# Patient Record
Sex: Female | Born: 1970 | Race: White | Hispanic: No | Marital: Single | Smoking: Former smoker
Health system: Southern US, Community
[De-identification: ages and names within clinical notes are randomized; demographics above are authoritative.]

## PROBLEM LIST (undated history)

## (undated) ENCOUNTER — Inpatient Hospital Stay (HOSPITAL_COMMUNITY): Payer: Self-pay

## (undated) DIAGNOSIS — F319 Bipolar disorder, unspecified: Secondary | ICD-10-CM

## (undated) DIAGNOSIS — F4310 Post-traumatic stress disorder, unspecified: Secondary | ICD-10-CM

## (undated) DIAGNOSIS — F419 Anxiety disorder, unspecified: Secondary | ICD-10-CM

## (undated) DIAGNOSIS — F329 Major depressive disorder, single episode, unspecified: Secondary | ICD-10-CM

## (undated) DIAGNOSIS — R87619 Unspecified abnormal cytological findings in specimens from cervix uteri: Secondary | ICD-10-CM

## (undated) DIAGNOSIS — IMO0002 Reserved for concepts with insufficient information to code with codable children: Secondary | ICD-10-CM

## (undated) DIAGNOSIS — F32A Depression, unspecified: Secondary | ICD-10-CM

## (undated) DIAGNOSIS — M1990 Unspecified osteoarthritis, unspecified site: Secondary | ICD-10-CM

## (undated) DIAGNOSIS — J45909 Unspecified asthma, uncomplicated: Secondary | ICD-10-CM

## (undated) HISTORY — DX: Bipolar disorder, unspecified: F31.9

## (undated) HISTORY — PX: NO PAST SURGERIES: SHX2092

---

## 2008-10-21 ENCOUNTER — Encounter (INDEPENDENT_AMBULATORY_CARE_PROVIDER_SITE_OTHER): Payer: Self-pay | Admitting: *Deleted

## 2008-10-21 DIAGNOSIS — F3289 Other specified depressive episodes: Secondary | ICD-10-CM | POA: Insufficient documentation

## 2008-10-21 DIAGNOSIS — F332 Major depressive disorder, recurrent severe without psychotic features: Secondary | ICD-10-CM | POA: Diagnosis present

## 2008-10-21 DIAGNOSIS — F329 Major depressive disorder, single episode, unspecified: Secondary | ICD-10-CM | POA: Insufficient documentation

## 2008-11-25 ENCOUNTER — Ambulatory Visit: Payer: Self-pay | Admitting: Family Medicine

## 2008-11-25 DIAGNOSIS — F312 Bipolar disorder, current episode manic severe with psychotic features: Secondary | ICD-10-CM

## 2008-11-25 DIAGNOSIS — R8789 Other abnormal findings in specimens from female genital organs: Secondary | ICD-10-CM | POA: Insufficient documentation

## 2008-11-25 DIAGNOSIS — F3163 Bipolar disorder, current episode mixed, severe, without psychotic features: Secondary | ICD-10-CM | POA: Insufficient documentation

## 2008-12-03 ENCOUNTER — Encounter: Payer: Self-pay | Admitting: Family Medicine

## 2008-12-03 ENCOUNTER — Ambulatory Visit: Payer: Self-pay | Admitting: Family Medicine

## 2008-12-03 LAB — CONVERTED CEMR LAB
ALT: 12 units/L (ref 0–35)
AST: 12 units/L (ref 0–37)
Albumin: 4.2 g/dL (ref 3.5–5.2)
Alkaline Phosphatase: 42 units/L (ref 39–117)
BUN: 10 mg/dL (ref 6–23)
CO2: 25 meq/L (ref 19–32)
Calcium: 9.4 mg/dL (ref 8.4–10.5)
Chloride: 106 meq/L (ref 96–112)
Cholesterol: 167 mg/dL (ref 0–200)
Creatinine, Ser: 0.78 mg/dL (ref 0.40–1.20)
Glucose, Bld: 84 mg/dL (ref 70–99)
HCT: 36.5 % (ref 36.0–46.0)
HDL: 44 mg/dL (ref >39)
Hemoglobin: 11.9 g/dL — ABNORMAL LOW (ref 12.0–15.0)
LDL Cholesterol: 107 mg/dL — ABNORMAL HIGH (ref 0–99)
MCHC: 32.6 g/dL (ref 30.0–36.0)
MCV: 88 fL (ref 78.0–100.0)
Platelets: 242 10*3/uL (ref 150–400)
Potassium: 4.3 meq/L (ref 3.5–5.3)
RBC: 4.15 M/uL (ref 3.87–5.11)
RDW: 13.2 % (ref 11.5–15.5)
Sodium: 140 meq/L (ref 135–145)
TSH: 1.35 microintl units/mL (ref 0.350–4.500)
Total Bilirubin: 0.7 mg/dL (ref 0.3–1.2)
Total CHOL/HDL Ratio: 3.8
Total Protein: 6.1 g/dL (ref 6.0–8.3)
Triglycerides: 78 mg/dL (ref <150)
VLDL: 16 mg/dL (ref 0–40)
WBC: 5 10*3/uL (ref 4.0–10.5)

## 2008-12-09 ENCOUNTER — Encounter: Payer: Self-pay | Admitting: Family Medicine

## 2009-01-07 ENCOUNTER — Encounter: Payer: Self-pay | Admitting: Family Medicine

## 2009-01-07 ENCOUNTER — Ambulatory Visit: Payer: Self-pay | Admitting: Family Medicine

## 2009-01-07 ENCOUNTER — Other Ambulatory Visit: Admission: RE | Admit: 2009-01-07 | Discharge: 2009-01-07 | Payer: Self-pay | Admitting: Family Medicine

## 2009-01-19 ENCOUNTER — Encounter: Payer: Self-pay | Admitting: Family Medicine

## 2009-03-18 ENCOUNTER — Telehealth: Payer: Self-pay | Admitting: *Deleted

## 2010-07-06 NOTE — Assessment & Plan Note (Signed)
Summary: CPE+pap/KH   Primary Care Provider:  Delbert Harness   History of Present Illness: 40 yo WF with hx of bipolar disorder here for annual visit.  LMP:  July 21 Contraception: none- no relationship in 3 years Regular Menses: regular periods- every 24-25 Hx of Anemia: no FHx of Breast, Uterine, Cervical or Ovarian Cancer:  No Last Pap:  several years ago Hx of Abnormal Pap:  yes- had colposcopy in 2002 and was told it was normal but needed to do paps every 6 months. Desires STD testing: No Last Mammogram:  5/09 Abnormalities on Self-exam:  No Hx of Abnormal Mammogram:  No    Allergies: No Known Drug Allergies PMH-FH-SH reviewed-no changes except otherwise noted  Review of Systems      See HPI General:  Denies fever and weight loss. CV:  Denies chest pain or discomfort, palpitations, and swelling of feet. Resp:  Denies cough and shortness of breath. GI:  Denies constipation, diarrhea, and hemorrhoids. GU:  Denies discharge, dysuria, and incontinence.  Physical Exam  General:  Well-developed,well-nourished,in no acute distress; alert,appropriate and cooperative throughout examination.  Very pleasant lady. Head:  Normocephalic and atraumatic without obvious abnormalities. No apparent alopecia or balding. Eyes:   EOMI. Perrla.  Nose:  External nasal examination shows no deformity or inflammation. Nasal mucosa are pink and moist without lesions or exudates. Mouth:  Oral mucosa and oropharynx without lesions or exudates.  Teeth in good repair. Neck:  No deformities, masses, or tenderness noted. Breasts:  No mass, nodules, thickening, tenderness, bulging, retraction, inflamation, nipple discharge or skin changes noted.   Lungs:  Normal respiratory effort, chest expands symmetrically. Lungs are clear to auscultation, no crackles or wheezes. Heart:  Normal rate and regular rhythm. S1 and S2 normal without gallop, murmur, click, rub or other extra sounds. Abdomen:  Bowel sounds  positive,abdomen soft and non-tender without masses, organomegaly or hernias noted. Genitalia:  Pelvic Exam:        External: normal female genitalia without lesions or masses        Vagina: normal without lesions or masses        Cervix: normal without lesions or masses        Adnexa: normal bimanual exam without masses or fullness        Uterus: normal by palpation        Pap smear: performed Pulses:  R and L carotid,radial,femoral,dorsalis pedis and posterior tibial pulses are full and equal bilaterally Extremities:  No clubbing, cyanosis, edema, or deformity noted with normal full range of motion of all joints.   Neurologic:  alert & oriented X3.   Psych:  Oriented X3, memory intact for recent and remote, normally interactive, and good eye contact.     Impression & Recommendations:  Problem # 1:  PREVENTIVE HEALTH CARE (ICD-V70.0) Advised multivitamin. Healthy weight.  Due to tdap today.  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  Will do pap smear today and discuss once results if need for increased frequency of monitoring or regular schedule.  I do not think patient has had any negatives since colposcopy.  Orders: [facility] - Est  18-39 yrs ([id number])  Complete Medication List: 1)  Wellbutrin Sr 100 Mg Xr12h-tab (Bupropion hcl) .... Take 2 tablets by mouth once daily 2)  Geodon 80 Mg Caps (Ziprasidone hcl) .... Take 1 tablet by mouth two times a day 3)  Seroquel 50 Mg Tabs (Quetiapine fumarate) .... Take 1 tablet by mouth once daily 4)  Benztropine Mesylate 0.5 Mg Tabs (Benztropine mesylate) .... Take 1 tablet by mouth at bedtime  Other Orders: Tdap => 26yrs IM [phone]) Admin 1st Vaccine ([address]) Admin 1st Vaccine [facility]) [phone])  TD Result Date:  01/07/2009 TD Result:  given  TD Next Due:  10 yr    Tetanus/Td Vaccine    Vaccine Type: Tdap    Site: right deltoid    Mfr: [email or web address]    Dose: 0.5 ml    Route: IM    Given by: Theresia Lo RN    Exp. Date: 04/09/2011    Lot #: [id number]    VIS given: 04/24/07 version given January 07, 2009.

## 2010-07-06 NOTE — Letter (Signed)
Summary: Results Follow-up Letter  [facility]  [address]   [place], [place] [address]   Phone: [phone]  Fax: [phone]    12/09/2008  [address] [place], [place]  [address]  Dear Kelly Carr,   The following are the results of your recent test(s):  Complete Blood Count, Complete Metabolic Panel, Fasting Cholesterol, and Thyroid (TSH) were all essentially normal.    If you need copies of these for your mental health provider, please let our clinic know so you don't have to duplicate tests and if you would ask your other providers to send their records to my office, it would be very helpful!  Thanks!  It was very nice meeting you!  Sincerely,  Delbert Harness MD [facility]           Appended Document: Results Follow-up Letter mailed letter to pt

## 2010-07-06 NOTE — Letter (Signed)
Summary: Results Follow-up Letter  [facility]  [address]   [place], [place] [address]   Phone: [phone]  Fax: [phone]    01/19/2009  [address] [place], [place]  [address]  Dear Ms. Rebstock,   The following are the results of your recent test(s):  Test     Result     Pap Smear    Normal__X_____  Not Normal_____       Comments: You are due for your pap in 1 year.   Sincerely,  Delbert Harness MD [facility]           Appended Document: Results Follow-up Letter mailed

## 2010-07-06 NOTE — Progress Notes (Signed)
Summary: triage  Phone Note Call from Patient Call back at Home Phone [phone]   Caller: Patient Reason for Call: Acute Illness Summary of Call: blood sugar running high and wonders if she should come in to be seen.   Initial call taken by: Clydell Hakim,  March 18, 2009 2:03 PM  Follow-up for Phone Call        she has been using her mom's meter to check herself & her  39 yr old dtr. fasting cbgs are 114-118. she is concerned. states they both feel fine. told her I will send this info to md & call her with response Follow-up by: Golden Circle RN,  March 18, 2009 2:14 PM  Additional Follow-up for Phone Call Additional follow up Details #1::        Based on my records her last fasting cbg was in the 80's.  Please have her make an appt (not urgent) and bring in her meter if she would like to validate it. Additional Follow-up by: Delbert Harness MD,  March 21, 2009 10:05 PM    Additional Follow-up for Phone Call Additional follow up Details #2::    read her md response. she will call back & make appt Follow-up by: Golden Circle RN,  March 23, 2009 9:10 AM

## 2010-07-06 NOTE — Assessment & Plan Note (Signed)
Summary: new pt appt/AC   Vital Signs:  Patient profile:   40 year old female Height:      64 inches Weight:      139.9 pounds BMI:     24.10 Temp:     98.6 degrees F Pulse rate:   89 / minute BP sitting:   106 / 71  (left arm) Cuff size:   regular  Vitals Entered By: Dedra Skeens CMA, (November 25, 2008 1:52 PM) CC: new patient Is Patient Diabetic? No Pain Assessment Patient in pain? no        Primary Care Provider:  Delbert Harness  CC:  new patient.  History of Present Illness: 40 yo with PMH sig for Bipolar DO here to establish primary care.  Primary Care:  Moved in [place] 2009 from [place], [place] to be closer to family.  She is here living with her brother which has been financially good since she is a single mother and is now able to work just one job to support her family.  She also moved here to better be able to take care of her mother who has multiple health problems.  States that overall she is doing much better, has paid of many of her debts and this has been a good change for her.  Bipolar DO:  Has history of inpatietn hospitalizations for manic episodes. Has been stable for 3 years on current medical regimen.  [facility] manages her psych medications.  Feels like she is continuing to do well despite life stresses and denies anhedonia, depression, manic symptoms.  Hx of abnormal pap smear:  States that several years ago had several abnormal pap smears that resulted in colposcopy.  States that she was told the colpo was normal, but would need pap smears every 6 months for a while.  She was lost to follow-up.  Does not recall being told she had HPV.  Would like to resume care and make sure this is taken care of.       Habits & Providers  Alcohol-Tobacco-Diet     Tobacco Status: never  Allergies: No Known Drug Allergies  Past History:  Past Medical History: Depression- bipolar dx age 29 hypomanic       - last hospitalization 2004 has been stable on same  medications [facility] q 3 months) Hx of abnormal pap smears s/p colpo that was "normal" per report  Past Surgical History: None  Family History: Reviewed history from 10/21/2008 and no changes required. Mom: Diabetes, HTN, First MI age 1's s/p triple bypass, obese, DM, bipolar Dad: unknown, alcoholism 3 brothers: no medical problems MGM: s/p bypass surgery, obese  Family History of CAD Female 1st degree relative <60 Family History Diabetes 1st degree relative  Social History: Reviewed history from 10/21/2008 and no changes required. Pt lives with her son born in 109 and daughter born in 2002.  Works full time at [employer] .  Enjoys spending time with her children and watching movies.  Uses no alcohol or illicit drugs since early 20's, and quit smoking cigarettes in 2001.  She exercises on exercise machines 2 times per week for 30 minutes.  Recently moved to [place] to take care of mother and to have more family support as a single mother.  Is not in a relationship- not sexually active.Smoking Status:  never  Review of Systems      See HPI General:  Denies fever. Eyes:  Denies blurring and double vision. ENT:  Denies nosebleeds  and sore throat. CV:  Denies chest pain or discomfort, lightheadness, near fainting, and palpitations. Resp:  Denies cough and shortness of breath. GI:  Denies abdominal pain, constipation, diarrhea, nausea, and vomiting. GU:  Denies discharge and dysuria. MS:  Denies joint pain.  Physical Exam  General:  Well-developed,well-nourished,in no acute distress; alert,appropriate and cooperative throughout examination.  Very pleasant lady. Eyes:   EOMI. Perrla.  Neck:  supple, full ROM, no masses, and no thyromegaly.   Lungs:  Normal respiratory effort, chest expands symmetrically. Lungs are clear to auscultation, no crackles or wheezes. Heart:  Normal rate and regular rhythm. S1 and S2 normal without gallop, murmur, click, rub or other extra  sounds. Abdomen:  Bowel sounds positive,abdomen soft and non-tender without masses, organomegaly or hernias noted. Extremities:  No clubbing, cyanosis, edema, or deformity noted  Cervical Nodes:  no anterior cervical adenopathy and no posterior cervical adenopathy.   Psych:  Oriented X3, memory intact for recent and remote, normally interactive, good eye contact, not anxious appearing, not depressed appearing, and not agitated.     Impression & Recommendations:  Problem # 1:  Hx of OTH ABNORMAL PAPANICOLAOU SMEAR CERVIX&CERV HPV (ICD-795.09) Will try to obtain records before her next appointment at which time we will do a pap smear/ well woman exam.  Problem # 2:  BIPOLAR AFFECTIVE DISORDER (ICD-296.80)  Appears to be stable on current regimen.  Managed by [facility].  Will check CMET, CBC, FLP for drug monitoring as patient says no bloodwork has been drawn by her [facility].  Looking for signs of metabolic syndrome, leukopenia.  Future Orders: Comp Met-[facility] [id number]) ... 11/19/2009 Lipid-[facility] ([id number]) ... 11/19/2009 CBC-[facility] ([id number]) ... 11/13/2009 TSH-[facility] [id number]) ... 11/13/2009  Problem # 3:  PREVENTIVE HEALTH CARE (ICD-V70.0) Advised multivitamin for woman of reproductive age.   BMI healthy.  Given pamphlet for Debt Services Counseling.  Will asses or Tdap at next visit.  Complete Medication List: 1)  Wellbutrin Sr 100 Mg Xr12h-tab (Bupropion hcl) .... Take 2 tablets by mouth once daily 2)  Geodon 80 Mg Caps (Ziprasidone hcl) .... Take 1 tablet by mouth two times a day 3)  Seroquel 50 Mg Tabs (Quetiapine fumarate) .... Take 1 tablet by mouth once daily 4)  Benztropine Mesylate 0.5 Mg Tabs (Benztropine mesylate) .... Take 1 tablet by mouth at bedtime  Patient Instructions: 1)  It was very nice to meet you! 2)  Make appointment to go to the lab for fasting labs. 3)  Make appointment at next available for Pap smear! 4)  Take a daily multivitamin.  5)  Consider seeing Debt management counseling- see handout.

## 2010-11-10 ENCOUNTER — Ambulatory Visit (HOSPITAL_COMMUNITY)
Admission: RE | Admit: 2010-11-10 | Discharge: 2010-11-10 | Disposition: A | Discharge status: Home / Self Care | Payer: Self-pay | Source: Ambulatory Visit | Attending: Family Medicine | Admitting: Family Medicine

## 2010-11-10 ENCOUNTER — Ambulatory Visit (INDEPENDENT_AMBULATORY_CARE_PROVIDER_SITE_OTHER): Payer: Self-pay | Admitting: Family Medicine

## 2010-11-10 ENCOUNTER — Encounter: Payer: Self-pay | Admitting: Family Medicine

## 2010-11-10 DIAGNOSIS — G5602 Carpal tunnel syndrome, left upper limb: Secondary | ICD-10-CM | POA: Insufficient documentation

## 2010-11-10 DIAGNOSIS — K219 Gastro-esophageal reflux disease without esophagitis: Secondary | ICD-10-CM

## 2010-11-10 DIAGNOSIS — R079 Chest pain, unspecified: Secondary | ICD-10-CM | POA: Insufficient documentation

## 2010-11-10 DIAGNOSIS — G5600 Carpal tunnel syndrome, unspecified upper limb: Secondary | ICD-10-CM

## 2010-11-10 NOTE — Progress Notes (Signed)
  Subjective:    Kelly Carr is a 40 y.o. female who presents for evaluation of chest pain. Onset was 1 day ago (yesterday). Symptoms have improved since that time. The patient described the pain as sharp and does not radiate. Patient rated pain as a 6/10 in intensity. Associated symptoms are: none (see ROS). Aggravating factors are: large meals. Alleviating factors are: none - took TUMS with no relief. Patient's cardiac risk factors are: family history of premature cardiovascular disease. Patient's risk factors for DVT/PE: none. Previous cardiac testing: none - no issues with HTN, HLD, DM in the past - recent labs good. Today, endorses left arm numbness/tingling since this am. No CP today. Hx bilateral carpal tunnel.  The following portions of the patient's history were reviewed and updated as appropriate: allergies, current medications, past family history, past medical history, past social history, past surgical history and problem list. Mom: Diabetes, HTN, First MI age 80's s/p triple bypass, obese, DM, bipolar.  Review of Systems Pertinent items are noted in HPI.  Denies fever/chills, N/V/D/C, sour taste in mouth, SOB, dizziness, falls/trauma, back pain.  Objective:    BP 106/71  Pulse 73  Temp(Src) 98.9 F (37.2 C) (Oral)  Ht 5\' 4"  (1.626 m)  Wt 138 lb (62.596 kg)  BMI 23.69 kg/m2 General appearance: alert, cooperative and no distress Neck: no adenopathy, no carotid bruit, no JVD, supple, symmetrical, trachea midline and thyroid not enlarged, symmetric, no tenderness/mass/nodules Lungs: clear to auscultation bilaterally Heart: regular rate and rhythm, S1, S2 normal, no murmur, click, rub or gallop Abdomen: soft, non-tender; bowel sounds normal; no masses,  no organomegaly Extremities: extremities normal, atraumatic, no cyanosis or edema Pulses: 2+ and symmetric Neurologic: Alert and oriented X 3, normal strength and tone. Normal symmetric reflexes. Normal coordination and gait MSK:  Normal strength. LEFT: + Tinels. + Phalen.  Cardiographics ECG: normal sinus rhythm, no blocks or conduction defects, no ischemic changes  Imaging Chest x-ray: not indicated    Assessment:    Chest pain, suspected etiology: GERD   Right Carpal Tunnel   Plan:    Patient history and exam consistent with non-cardiac cause of chest pain. Conservative measures indicated.  Conservative measures for Carpal Tunnel as well.

## 2010-11-10 NOTE — Assessment & Plan Note (Signed)
SEE PATIENT INSTRUCTIONS

## 2010-11-10 NOTE — Assessment & Plan Note (Signed)
Rx Zantac OTC and behavioral modification.

## 2011-12-05 ENCOUNTER — Encounter (HOSPITAL_COMMUNITY): Payer: Self-pay | Admitting: *Deleted

## 2011-12-05 ENCOUNTER — Inpatient Hospital Stay (HOSPITAL_COMMUNITY)
Admission: AD | Admit: 2011-12-05 | Discharge: 2011-12-05 | Disposition: A | Discharge status: Home / Self Care | Payer: Self-pay | Source: Ambulatory Visit | Attending: Obstetrics & Gynecology | Admitting: Obstetrics & Gynecology

## 2011-12-05 DIAGNOSIS — B9689 Other specified bacterial agents as the cause of diseases classified elsewhere: Secondary | ICD-10-CM | POA: Insufficient documentation

## 2011-12-05 DIAGNOSIS — N949 Unspecified condition associated with female genital organs and menstrual cycle: Secondary | ICD-10-CM | POA: Insufficient documentation

## 2011-12-05 DIAGNOSIS — N938 Other specified abnormal uterine and vaginal bleeding: Secondary | ICD-10-CM | POA: Insufficient documentation

## 2011-12-05 DIAGNOSIS — A499 Bacterial infection, unspecified: Secondary | ICD-10-CM | POA: Insufficient documentation

## 2011-12-05 DIAGNOSIS — N760 Acute vaginitis: Secondary | ICD-10-CM | POA: Insufficient documentation

## 2011-12-05 DIAGNOSIS — N925 Other specified irregular menstruation: Secondary | ICD-10-CM

## 2011-12-05 HISTORY — DX: Unspecified abnormal cytological findings in specimens from cervix uteri: R87.619

## 2011-12-05 HISTORY — DX: Reserved for concepts with insufficient information to code with codable children: IMO0002

## 2011-12-05 LAB — WET PREP, GENITAL
Trich, Wet Prep: NONE SEEN
Yeast Wet Prep HPF POC: NONE SEEN

## 2011-12-05 LAB — URINE MICROSCOPIC-ADD ON

## 2011-12-05 LAB — URINALYSIS, ROUTINE W REFLEX MICROSCOPIC
Bilirubin Urine: NEGATIVE
Glucose, UA: NEGATIVE mg/dL
Ketones, ur: NEGATIVE mg/dL
Leukocytes, UA: NEGATIVE
Nitrite: NEGATIVE
Protein, ur: NEGATIVE mg/dL
Specific Gravity, Urine: 1.01 (ref 1.005–1.030)
Urobilinogen, UA: 0.2 mg/dL (ref 0.0–1.0)
pH: 7.5 (ref 5.0–8.0)

## 2011-12-05 LAB — CBC
HCT: 33.8 % — ABNORMAL LOW (ref 36.0–46.0)
Hemoglobin: 11.4 g/dL — ABNORMAL LOW (ref 12.0–15.0)
MCH: 29 pg (ref 26.0–34.0)
MCHC: 33.7 g/dL (ref 30.0–36.0)
MCV: 86 fL (ref 78.0–100.0)
Platelets: 265 10*3/uL (ref 150–400)
RBC: 3.93 MIL/uL (ref 3.87–5.11)
RDW: 12.7 % (ref 11.5–15.5)
WBC: 8.8 10*3/uL (ref 4.0–10.5)

## 2011-12-05 LAB — POCT PREGNANCY, URINE: Preg Test, Ur: NEGATIVE

## 2011-12-05 MED ORDER — IBUPROFEN 600 MG PO TABS
600.0000 mg | ORAL_TABLET | Freq: Once | ORAL | Status: AC
  Administered 2011-12-05: 600 mg via ORAL
  Filled 2011-12-05: qty 1

## 2011-12-05 MED ORDER — MEDROXYPROGESTERONE ACETATE 10 MG PO TABS: 10.0000 mg | ORAL_TABLET | Freq: Every day | ORAL | Status: DC

## 2011-12-05 MED ORDER — METRONIDAZOLE 500 MG PO TABS: 500.0000 mg | ORAL_TABLET | Freq: Two times a day (BID) | ORAL | Status: AC

## 2011-12-05 MED ORDER — IBUPROFEN 600 MG PO TABS: 600.0000 mg | ORAL_TABLET | Freq: Four times a day (QID) | ORAL | Status: AC | PRN

## 2011-12-05 NOTE — Discharge Instructions (Signed)
Abnormal Uterine Bleeding Abnormal uterine bleeding can have many causes. Some cases are simply treated, while others are more serious. There are several kinds of bleeding that is considered abnormal, including:  Bleeding between periods.   Bleeding after sexual intercourse.   Spotting anytime in the menstrual cycle.   Bleeding heavier or more than normal.   Bleeding after menopause.  CAUSES  There are many causes of abnormal uterine bleeding. It can be present in teenagers, pregnant women, women during their reproductive years, and women who have reached menopause. Your caregiver will look for the more common causes depending on your age, signs, symptoms and your particular circumstance. Most cases are not serious and can be treated. Even the more serious causes, like cancer of the female organs, can be treated adequately if found in the early stages. That is why all types of bleeding should be evaluated and treated as soon as possible. DIAGNOSIS  Diagnosing the cause may take several kinds of tests. Your caregiver may:  Take a complete history of the type of bleeding.   Perform a complete physical exam and Pap smear.   Take an ultrasound on the abdomen showing a picture of the female organs and the pelvis.   Inject dye into the uterus and Fallopian tubes and X-ray them (hysterosalpingogram).   Place fluid in the uterus and do an ultrasound (sonohysterogrqphy).   Take a CT scan to examine the female organs and pelvis.   Take an MRI to examine the female organs and pelvis. There is no X-ray involved with this procedure.   Look inside the uterus with a telescope that has a light at the end (hysteroscopy).   Scrap the inside of the uterus to get tissue to examine (Dilatation and Curettage, D&C).   Look into the pelvis with a telescope that has a light at the end (laparoscopy). This is done through a very small cut (incision) in the abdomen.  TREATMENT  Treatment will depend on the  cause of the abnormal bleeding. It can include:  Doing nothing to allow the problem to take care of itself over time.   Hormone treatment.   Birth control pills.   Treating the medical condition causing the problem.   Laparoscopy.   Major or minor surgery   Destroying the lining of the uterus with electrical currant, laser, freezing or heat (uterine ablation).  HOME CARE INSTRUCTIONS   Follow your caregiver's recommendation on how to treat your problem.   See your caregiver if you missed a menstrual period and think you may be pregnant.   If you are bleeding heavily, count the number of pads/tampons you use and how often you have to change them. Tell this to your caregiver.   Avoid sexual intercourse until the problem is controlled.  SEEK MEDICAL CARE IF:   You have any kind of abnormal bleeding mentioned above.   You feel dizzy at times.   You are 41 years old and have not had a menstrual period yet.  SEEK IMMEDIATE MEDICAL CARE IF:   You pass out.   You are changing pads/tampons every 15 to 30 minutes.   You have belly (abdominal) pain.   You have a temperature of 100 F (37.8 C) or higher.   You become sweaty or weak.   You are passing large blood clots from the vagina.   You start to feel sick to your stomach (nauseous) and throw up (vomit).  Document Released: 05/23/2005 Document Revised: 05/12/2011 Document Reviewed: 10/16/2008 Exit[employer]   Patient Information 2012 [employer]. 

## 2011-12-05 NOTE — MAU Provider Note (Signed)
History     CSN: [id number]  Arrival date and time: 12/05/11 1534   None     Chief Complaint  Patient presents with  . Vaginal Bleeding   HPI  Patient states she has been having irregular periods for about 5 months. Prior to that cycle was every 25-28 days.  States she had bleeding on 6-17 then started again today with heavy bleeding.  Blood saturated clothing.   Reports using 4 pads earlier this morning.  Has had bad cramping today also. No report of passing clots.   Sore breasts for 4 months. Has taken multiple pregnancy test over the past 5 months and have been negative.   Past Medical History  Diagnosis Date  . Bipolar 1 disorder     History reviewed. No pertinent past surgical history.  History reviewed. No pertinent family history.  History  Substance Use Topics  . Smoking status: Former Games developer  . Smokeless tobacco: Never Used  . Alcohol Use: No    Allergies: No Known Allergies  Prescriptions prior to admission  Medication Sig Dispense Refill  . benztropine (COGENTIN) 0.5 MG tablet Take 0.25 mg by mouth at bedtime.       Marland Kitchen buPROPion (WELLBUTRIN SR) 100 MG 12 hr tablet Take 200 mg by mouth daily.        . calcium-vitamin D (OSCAL WITH D) 500-200 MG-UNIT per tablet Take 1 tablet by mouth daily.      . Melatonin 5 MG TABS Take 1 tablet by mouth at bedtime. For sleep      . traZODone (DESYREL) 50 MG tablet Take 12.5-25 mg by mouth at bedtime as needed. For sleep      . ziprasidone (GEODON) 80 MG capsule Take 80 mg by mouth daily.         Review of Systems  Gastrointestinal: Positive for nausea and abdominal pain.  Genitourinary:       Vaginal bleeding  Skin:       +breast tenderness and leaking of clear discharge x 4 months.    All other systems reviewed and are negative.   Physical Exam   Blood pressure 132/79, pulse 108, temperature 98.9 F (37.2 C), temperature source Oral, resp. rate 16, height 5\' 4"  (1.626 m), weight 65.227 kg (143 lb 12.8 oz), last  menstrual period 11/21/2011, SpO2 100.00%.  Physical Exam  Constitutional: She is oriented to person, place, and time. She appears well-developed and well-nourished.  HENT:  Head: Normocephalic.  Neck: Normal range of motion. Neck supple.  Cardiovascular: Normal rate, regular rhythm and normal heart sounds.   Respiratory: Effort normal and breath sounds normal.  GI: Soft. She exhibits no mass. There is tenderness. There is no guarding.  Genitourinary: Uterus is enlarged. Uterus is not tender. Cervix exhibits no motion tenderness and no discharge. Right adnexum displays no mass and no tenderness. Left adnexum displays no mass and no tenderness. There is bleeding (negative clots; +odor) around the vagina.  Neurological: She is alert and oriented to person, place, and time. She has normal reflexes.  Skin: Skin is warm and dry.    MAU Course  Procedures  Results for orders placed during the hospital encounter of 12/05/11 (from the past 24 hour(s))  URINALYSIS, ROUTINE W REFLEX MICROSCOPIC     Status: Abnormal   Collection Time   12/05/11  4:00 PM      Component Value Range   Color, Urine YELLOW  YELLOW   APPearance CLOUDY (*) CLEAR   Specific Gravity, Urine  1.010  1.005 - 1.030   pH 7.5  5.0 - 8.0   Glucose, UA NEGATIVE  NEGATIVE mg/dL   Hgb urine dipstick LARGE (*) NEGATIVE   Bilirubin Urine NEGATIVE  NEGATIVE   Ketones, ur NEGATIVE  NEGATIVE mg/dL   Protein, ur NEGATIVE  NEGATIVE mg/dL   Urobilinogen, UA 0.2  0.0 - 1.0 mg/dL   Nitrite NEGATIVE  NEGATIVE   Leukocytes, UA NEGATIVE  NEGATIVE  URINE MICROSCOPIC-ADD ON     Status: Abnormal   Collection Time   12/05/11  4:00 PM      Component Value Range   Squamous Epithelial / LPF FEW (*) RARE   WBC, UA 0-2  <3 WBC/hpf   RBC / HPF 3-6  <3 RBC/hpf   Bacteria, UA MANY (*) RARE  POCT PREGNANCY, URINE     Status: Normal   Collection Time   12/05/11  4:16 PM      Component Value Range   Preg Test, Ur NEGATIVE  NEGATIVE  WET PREP,  GENITAL     Status: Abnormal   Collection Time   12/05/11  7:10 PM      Component Value Range   Yeast Wet Prep HPF POC NONE SEEN  NONE SEEN   Trich, Wet Prep NONE SEEN  NONE SEEN   Clue Cells Wet Prep HPF POC FEW (*) NONE SEEN   WBC, Wet Prep HPF POC FEW (*) NONE SEEN  CBC     Status: Abnormal   Collection Time   12/05/11  7:18 PM      Component Value Range   WBC 8.8  4.0 - 10.5 K/uL   RBC 3.93  3.87 - 5.11 MIL/uL   Hemoglobin 11.4 (*) 12.0 - 15.0 g/dL   HCT 98.1 (*) 19.1 - 47.8 %   MCV 86.0  78.0 - 100.0 fL   MCH 29.0  26.0 - 34.0 pg   MCHC 33.7  30.0 - 36.0 g/dL   RDW 29.5  62.1 - 30.8 %   Platelets 265  150 - 400 K/uL     Assessment and Plan  Dysfunctional Uterine Bleeding - stable Bacterial Vaginosis  Plan: DC to home RX Provera Pelvic US outpatient Prolactin/TSH pending Schedule follow-up appointment in GYN  [facility] 12/05/2011, 6:40 PM

## 2011-12-05 NOTE — MAU Note (Signed)
Patient states she has been having irregular periods for about 4 months. States she had bleeding on 6-17 then started again today with heavy bleeding. Has had bad cramping today. Pad patient has on has a small amount of dark blood on it. Has had sore breasts for 4 months. Has taken multiple pregnancy test over the past 5 months and have been negative.

## 2011-12-06 LAB — TSH: TSH: 1.023 u[IU]/mL (ref 0.350–4.500)

## 2011-12-06 LAB — PROLACTIN: Prolactin: 2 ng/mL

## 2011-12-09 ENCOUNTER — Telehealth (HOSPITAL_COMMUNITY): Payer: Self-pay | Admitting: Nurse Practitioner

## 2011-12-09 ENCOUNTER — Ambulatory Visit (HOSPITAL_COMMUNITY)
Admission: RE | Admit: 2011-12-09 | Discharge: 2011-12-09 | Disposition: A | Discharge status: Home / Self Care | Payer: Self-pay | Source: Ambulatory Visit | Attending: Family | Admitting: Family

## 2011-12-09 DIAGNOSIS — N949 Unspecified condition associated with female genital organs and menstrual cycle: Secondary | ICD-10-CM | POA: Insufficient documentation

## 2011-12-09 DIAGNOSIS — N925 Other specified irregular menstruation: Secondary | ICD-10-CM | POA: Insufficient documentation

## 2011-12-09 DIAGNOSIS — N938 Other specified abnormal uterine and vaginal bleeding: Secondary | ICD-10-CM | POA: Insufficient documentation

## 2011-12-09 NOTE — Telephone Encounter (Signed)
This patient was evaluated 3 days ago for abnormal vaginal bleeding. She was scheduled for follow up ultrasound today. The ultrasound was normal. She has a follow up appointment in 3 weeks with [facility]. I discussed the results of the ultrasound and need for her to continue the Provera until her appointment. I will phone in Percocet and she will continue ibuprofen as needed.

## 2011-12-29 ENCOUNTER — Encounter: Payer: Self-pay | Admitting: Obstetrics & Gynecology

## 2011-12-29 ENCOUNTER — Ambulatory Visit (INDEPENDENT_AMBULATORY_CARE_PROVIDER_SITE_OTHER): Payer: Self-pay | Admitting: Obstetrics & Gynecology

## 2011-12-29 VITALS — BP 117/70 | HR 83 | Temp 98.0°F | Resp 12 | Ht 64.0 in | Wt 138.4 lb

## 2011-12-29 DIAGNOSIS — N949 Unspecified condition associated with female genital organs and menstrual cycle: Secondary | ICD-10-CM

## 2011-12-29 DIAGNOSIS — Z01419 Encounter for gynecological examination (general) (routine) without abnormal findings: Secondary | ICD-10-CM

## 2011-12-29 DIAGNOSIS — Z3009 Encounter for other general counseling and advice on contraception: Secondary | ICD-10-CM | POA: Insufficient documentation

## 2011-12-29 DIAGNOSIS — N925 Other specified irregular menstruation: Secondary | ICD-10-CM

## 2011-12-29 DIAGNOSIS — N938 Other specified abnormal uterine and vaginal bleeding: Secondary | ICD-10-CM

## 2011-12-29 LAB — POCT PREGNANCY, URINE: Preg Test, Ur: NEGATIVE

## 2011-12-29 MED ORDER — NORGESTIMATE-ETH ESTRADIOL 0.25-35 MG-MCG PO TABS: 1.0000 | ORAL_TABLET | Freq: Every day | ORAL | Status: DC

## 2011-12-29 NOTE — Progress Notes (Signed)
Patient ID: Kelly Carr, female   DOB: 04/18/1971, 41 y.o.   MRN: [id number]  Chief Complaint  Patient presents with  . Procedure    endometrial biopsy/dub    HPI Kelly Carr is a 41 y.o. female.  Patient comes in followup after in MAU visit for irregular occasional heavy bleeding. She had regular menses until the 5 months ago. She thinks that stress at home may be playing a part in her menstrual irregularity. She's been sexually active for the last 2 years and has not conceived using no birth control method. Her partner has fathered children previously. She does not wish to conceive. HPI  Past Medical History  Diagnosis Date  . Bipolar 1 disorder   . Abnormal Pap smear     Unknown results>colpo>normal    No past surgical history on file.  No family history on file.  Social History History  Substance Use Topics  . Smoking status: Former Games developer  . Smokeless tobacco: Never Used  . Alcohol Use: No    No Known Allergies  Current Outpatient Prescriptions  Medication Sig Dispense Refill  . benztropine (COGENTIN) 0.5 MG tablet Take 0.25 mg by mouth at bedtime.       Marland Kitchen buPROPion (WELLBUTRIN SR) 100 MG 12 hr tablet Take 200 mg by mouth daily.        . calcium-vitamin D (OSCAL WITH D) 500-200 MG-UNIT per tablet Take 1 tablet by mouth daily.      . medroxyPROGESTERone (PROVERA) 10 MG tablet Take 1 tablet (10 mg total) by mouth daily.  30 tablet  0  . Melatonin 5 MG TABS Take 1 tablet by mouth at bedtime. For sleep      . traZODone (DESYREL) 50 MG tablet Take 12.5-25 mg by mouth at bedtime as needed. For sleep      . ziprasidone (GEODON) 80 MG capsule Take 80 mg by mouth daily.       . norgestimate-ethinyl estradiol (ORTHO-CYCLEN,SPRINTEC,PREVIFEM) 0.25-35 MG-MCG tablet Take 1 tablet by mouth daily.  1 Package  11    Review of Systems Review of Systems  Genitourinary: Positive for vaginal bleeding (brownish spotting on Provera). Negative for vaginal pain.    Blood pressure  117/70, pulse 83, temperature 98 F (36.7 C), temperature source Oral, resp. rate 12, height 5\' 4"  (1.626 m), weight 138 lb 6.4 oz (62.778 kg), last menstrual period 11/28/2011.  Physical Exam Physical Exam  Constitutional: She appears well-developed and well-nourished. No distress.  Abdominal: Soft. She exhibits no distension and no mass. There is no tenderness.  Genitourinary: Uterus normal. Vaginal discharge (brown vaginal spotting) found.       No adnexal masses  Neurological: She is alert.  Skin: Skin is warm and dry.  Psychiatric: She has a normal mood and affect.    Data Reviewed *RADIOLOGY REPORT*  Clinical Data: Vaginal bleeding  TRANSABDOMINAL AND TRANSVAGINAL ULTRASOUND OF PELVIS  Technique: Both transabdominal and transvaginal ultrasound  examinations of the pelvis were performed. Transabdominal technique  was performed for global imaging of the pelvis including uterus,  ovaries, adnexal regions, and pelvic cul-de-sac.  It was necessary to proceed with endovaginal exam following the  transabdominal exam to visualize the endometrium and ovaries.  Comparison: None  Findings:  Uterus: Normal in size at 10.0 x 4.3 x 6.0 cm.  Endometrium: Uniform in thickness at 5 mm.  Right ovary: Normal in size measuring 2.1 x 1.8 x 1.6 cm.  Left ovary: Normal in size measuring 2.6 x 2.6 x 2.0  cm.  Other findings: No free fluid  IMPRESSION:  1. Normal pelvic exam.  2. Normal endometrium.  Original Report Authenticated By: Genevive Bi, M.D. CBC    Component Value Date/Time   WBC 8.8 12/05/2011 1918   RBC 3.93 12/05/2011 1918   HGB 11.4* 12/05/2011 1918   HCT 33.8* 12/05/2011 1918   PLT 265 12/05/2011 1918   MCV 86.0 12/05/2011 1918   MCH 29.0 12/05/2011 1918   MCHC 33.7 12/05/2011 1918   RDW 12.7 12/05/2011 1918      Assessment    Normal pelvic ultrasound normal endometrium with dysfunctional uterine bleeding. Likely due to anovulation. She would like to have a birth control method and  cycle control.    Plan    Sprintec was prescribed. Recommend she return in 3 months to review her progress. She will report if her symptoms do not improve  Dr. Scheryl Darter 12/29/2011 2:55 PM        Noam Franzen 12/29/2011, 2:50 PM

## 2011-12-29 NOTE — Patient Instructions (Signed)
Oral Contraception Use Oral contraceptives (OCs) are medicines taken to prevent pregnancy. OCs work by preventing the ovaries from releasing eggs. The hormones in OCs also cause the cervical mucus to thicken, preventing the sperm from entering the uterus. The hormones also cause the uterine lining to become thin, not allowing a fertilized egg to attach to the inside of the uterus. OCs are highly effective when taken exactly as prescribed. However, OCs do not prevent sexually transmitted diseases (STDs). Safe sex practices, such as using condoms along with an OC, can help prevent STDs.  Before taking OCs, you may have a physical exam and Pap test. Your caregiver may also order blood tests if necessary. Your caregiver will make sure you are a good candidate for oral contraception. Discuss with your caregiver the possible side effects of the OC you may be prescribed. When starting an OC, it can take 2 to 3 months for the body to adjust to the changes in hormone levels in your body.  HOW TO TAKE ORAL CONTRACEPTIVES Your caregiver may advise you on how to start taking the first cycle of OCs. Otherwise, you can:  Start on day 1 of your menstrual period. You will not need any backup contraceptive protection with this start time.   Start on the first Sunday after your menstrual period or the day you get your prescription. In these cases, you will need to use backup contraceptive protection for the first 7-day cycle.  After you have started taking OCs:  If you forget to take 1 pill, take it as soon as you remember. Take the next pill at the regular time.   If you miss 2 or more pills, use backup birth control until your next menstrual period starts.   If you use a 28-day pack that contains inactive pills and you miss 1 of the last 7 pills (pills with no hormones), it will not matter. Throw away the rest of the non-hormone pills and start a new pill pack.  No matter which day you start the OC, you will always  start a new pack on that same day of the week. Have an extra pack of OCs and a backup contraceptive method available in case you miss some pills or lose your OC pack. HOME CARE INSTRUCTIONS   Do not smoke.   Always use a condom to protect against STDs. OCs do not protect against STDs.   Use a calendar to mark your menstrual period days.   Read the information and directions that come with your OC. Talk to your caregiver if you have questions.  SEEK MEDICAL CARE IF:   You develop nausea and vomiting.   You have abnormal vaginal discharge or bleeding.   You develop a rash.   You miss your menstrual period.   You are losing your hair.   You need treatment for mood swings or depression.   You get dizzy when taking the OC.   You develop acne from taking the OC.   You become pregnant.  SEEK IMMEDIATE MEDICAL CARE IF:   You develop chest pain.   You develop shortness of breath.   You have an uncontrolled or severe headache.   You develop numbness or slurred speech.   You develop visual problems.   You develop pain, redness, and swelling in the legs.  Document Released: 05/12/2011 Document Reviewed: 05/10/2011 [employer] Patient Information 2012 [employer]. 

## 2012-02-10 ENCOUNTER — Encounter: Payer: Self-pay | Admitting: Obstetrics & Gynecology

## 2012-07-23 ENCOUNTER — Emergency Department (HOSPITAL_COMMUNITY)
Admission: EM | Admit: 2012-07-23 | Discharge: 2012-07-24 | Disposition: A | Discharge status: Other Acute Inpatient Hospital | Payer: Self-pay | Attending: Emergency Medicine | Admitting: Emergency Medicine

## 2012-07-23 ENCOUNTER — Encounter (HOSPITAL_COMMUNITY): Payer: Self-pay | Admitting: Emergency Medicine

## 2012-07-23 DIAGNOSIS — F39 Unspecified mood [affective] disorder: Secondary | ICD-10-CM | POA: Insufficient documentation

## 2012-07-23 DIAGNOSIS — Z87891 Personal history of nicotine dependence: Secondary | ICD-10-CM | POA: Insufficient documentation

## 2012-07-23 DIAGNOSIS — R45851 Suicidal ideations: Secondary | ICD-10-CM

## 2012-07-23 DIAGNOSIS — Z79899 Other long term (current) drug therapy: Secondary | ICD-10-CM | POA: Insufficient documentation

## 2012-07-23 DIAGNOSIS — F319 Bipolar disorder, unspecified: Secondary | ICD-10-CM | POA: Insufficient documentation

## 2012-07-23 DIAGNOSIS — Z3202 Encounter for pregnancy test, result negative: Secondary | ICD-10-CM | POA: Insufficient documentation

## 2012-07-23 DIAGNOSIS — Z8742 Personal history of other diseases of the female genital tract: Secondary | ICD-10-CM | POA: Insufficient documentation

## 2012-07-23 DIAGNOSIS — F411 Generalized anxiety disorder: Secondary | ICD-10-CM | POA: Insufficient documentation

## 2012-07-23 HISTORY — DX: Anxiety disorder, unspecified: F41.9

## 2012-07-23 LAB — URINALYSIS, ROUTINE W REFLEX MICROSCOPIC
Bilirubin Urine: NEGATIVE
Glucose, UA: NEGATIVE mg/dL
Hgb urine dipstick: NEGATIVE
Ketones, ur: NEGATIVE mg/dL
Leukocytes, UA: NEGATIVE
Nitrite: NEGATIVE
Protein, ur: NEGATIVE mg/dL
Specific Gravity, Urine: 1.007 (ref 1.005–1.030)
Urobilinogen, UA: 0.2 mg/dL (ref 0.0–1.0)
pH: 7 (ref 5.0–8.0)

## 2012-07-23 LAB — CBC WITH DIFFERENTIAL/PLATELET
Basophils Absolute: 0 10*3/uL (ref 0.0–0.1)
Basophils Relative: 0 % (ref 0–1)
Eosinophils Absolute: 0.1 10*3/uL (ref 0.0–0.7)
Eosinophils Relative: 1 % (ref 0–5)
HCT: 38.9 % (ref 36.0–46.0)
Hemoglobin: 13.1 g/dL (ref 12.0–15.0)
Lymphocytes Relative: 26 % (ref 12–46)
Lymphs Abs: 2.1 10*3/uL (ref 0.7–4.0)
MCH: 28.9 pg (ref 26.0–34.0)
MCHC: 33.7 g/dL (ref 30.0–36.0)
MCV: 85.9 fL (ref 78.0–100.0)
Monocytes Absolute: 0.7 10*3/uL (ref 0.1–1.0)
Monocytes Relative: 9 % (ref 3–12)
Neutro Abs: 5.2 10*3/uL (ref 1.7–7.7)
Neutrophils Relative %: 65 % (ref 43–77)
Platelets: 264 10*3/uL (ref 150–400)
RBC: 4.53 MIL/uL (ref 3.87–5.11)
RDW: 12.8 % (ref 11.5–15.5)
WBC: 8 10*3/uL (ref 4.0–10.5)

## 2012-07-23 LAB — COMPREHENSIVE METABOLIC PANEL
ALT: 21 U/L (ref 0–35)
AST: 19 U/L (ref 0–37)
Albumin: 3.6 g/dL (ref 3.5–5.2)
Alkaline Phosphatase: 61 U/L (ref 39–117)
BUN: 9 mg/dL (ref 6–23)
CO2: 27 mEq/L (ref 19–32)
Calcium: 9.1 mg/dL (ref 8.4–10.5)
Chloride: 104 mEq/L (ref 96–112)
Creatinine, Ser: 0.6 mg/dL (ref 0.50–1.10)
GFR calc Af Amer: >90 mL/min (ref >90)
GFR calc non Af Amer: >90 mL/min (ref >90)
Glucose, Bld: 96 mg/dL (ref 70–99)
Potassium: 3.6 mEq/L (ref 3.5–5.1)
Sodium: 139 mEq/L (ref 135–145)
Total Bilirubin: 0.6 mg/dL (ref 0.3–1.2)
Total Protein: 7.2 g/dL (ref 6.0–8.3)

## 2012-07-23 LAB — RAPID URINE DRUG SCREEN, HOSP PERFORMED
Amphetamines: NOT DETECTED
Barbiturates: NOT DETECTED
Benzodiazepines: NOT DETECTED
Cocaine: NOT DETECTED
Opiates: NOT DETECTED
Tetrahydrocannabinol: NOT DETECTED

## 2012-07-23 LAB — ETHANOL: Alcohol, Ethyl (B): <11 mg/dL (ref 0–11)

## 2012-07-23 LAB — POCT PREGNANCY, URINE: Preg Test, Ur: NEGATIVE

## 2012-07-23 MED ORDER — ZOLPIDEM TARTRATE 5 MG PO TABS
5.0000 mg | ORAL_TABLET | Freq: Every evening | ORAL | Status: DC | PRN
Start: 2012-07-23 — End: 2012-07-24
  Administered 2012-07-24: 5 mg via ORAL
  Filled 2012-07-23: qty 1

## 2012-07-23 MED ORDER — LORAZEPAM 1 MG PO TABS
1.0000 mg | ORAL_TABLET | Freq: Three times a day (TID) | ORAL | Status: DC | PRN
Start: 2012-07-23 — End: 2012-07-24
  Administered 2012-07-23 – 2012-07-24 (×2): 1 mg via ORAL
  Filled 2012-07-23 (×2): qty 1

## 2012-07-23 MED ORDER — ONDANSETRON HCL 8 MG PO TABS: 4.0000 mg | ORAL_TABLET | Freq: Three times a day (TID) | ORAL | Status: DC | PRN

## 2012-07-23 MED ORDER — ACETAMINOPHEN 325 MG PO TABS
650.0000 mg | ORAL_TABLET | ORAL | Status: DC | PRN
  Administered 2012-07-24: 650 mg via ORAL
  Filled 2012-07-23: qty 2

## 2012-07-23 NOTE — ED Provider Notes (Signed)
Medical screening examination/treatment/procedure(s) were performed by non-physician practitioner and as supervising physician I was immediately available for consultation/collaboration.   Richardean Canal, MD 07/23/12 [phone]
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
## 2012-07-23 NOTE — ED Notes (Signed)
Phlebotomy at bedside.

## 2012-07-23 NOTE — ED Notes (Signed)
PA at bedside.
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
## 2012-07-23 NOTE — ED Notes (Signed)
Pt denies SI at this time but states she has had thoughts in the past. Pt had planned to take her medicine and not wake up.

## 2012-07-23 NOTE — ED Notes (Signed)
Having depression this episode started Friday  States take meds for it but not as she should on Friday took to much med on purpose( trazadone) states not sleeping at all having SI but really she just wants to sleep, and anxiety attack

## 2012-07-24 LAB — HCG, QUANTITATIVE, PREGNANCY: hCG, Beta Chain, Quant, S: <1 m[IU]/mL (ref <5)

## 2012-07-24 LAB — VITAMIN B12: Vitamin B-12: 667 pg/mL (ref 211–911)

## 2012-07-24 LAB — FOLATE: Folate: 14.1 ng/mL

## 2012-07-24 NOTE — BH Assessment (Signed)
Assessment Note  Update:  Pt accepted to [facility] to Dr. Tiburcio Pea per last clinician on shift.  Sheriff Paschal stated he was sending a Conservator, museum/gallery to transport pt at approximately 1130 today.  Pt informed.  Pt under IVC.  Updated EDP Ignacia Palma and ED staff.  Updated assessment disposition, completed assessment notification, and faxed to [facility] to log.      Disposition:  Disposition Disposition of Patient: Inpatient treatment program Type of inpatient treatment program: Adult Patient referred to: Other (Comment) (Pt accepted to [facility])  On Site Evaluation by:   Reviewed with Physician:  Bryson Ha, Rennis Harding 07/24/2012 11:03 AM

## 2012-07-24 NOTE — BHH Counselor (Signed)
Patient reviewed and accepted for admission to [facility], pending an available 400 hall bed, by Donell Sievert, PA at 0100 on 07/24/12.

## 2012-07-24 NOTE — BH Assessment (Signed)
[facility] Assessment Progress Note   Referral was made to [facility] around 04:30 and clinician spoke to [place] there.  She called back around 05:50 to report that Dr. Tiburcio Pea there had accepted patient.  Dr. Tiburcio Pea is requesting a EKG, HCG levels, B-12 & folate labs.  Dr. Rulon [facility]) was notified and ordered labs and completed IVC papers.  Clinician sent IVC papers to magistrate at 06:30.  Nurses are to call report when patient is picked up by [facility] Department.  Call report to [phone].

## 2012-07-24 NOTE — B[facility] Counselor (Signed)
Per Beatriz Stallion, patient accepted to [facility].

## 2012-07-24 NOTE — Progress Notes (Signed)
11:08 AM Pt accepted for Transfer to [facility] by Dr. Tiburcio Pea.

## 2012-09-10 ENCOUNTER — Ambulatory Visit (INDEPENDENT_AMBULATORY_CARE_PROVIDER_SITE_OTHER): Payer: BC Managed Care – PPO | Admitting: Family Medicine

## 2012-09-10 ENCOUNTER — Encounter: Payer: Self-pay | Admitting: Family Medicine

## 2012-09-10 VITALS — BP 117/50 | HR 72 | Temp 99.1°F | Ht 64.0 in | Wt 140.0 lb

## 2012-09-10 DIAGNOSIS — J209 Acute bronchitis, unspecified: Secondary | ICD-10-CM

## 2012-09-10 MED ORDER — BENZONATATE 100 MG PO CAPS: 100.0000 mg | ORAL_CAPSULE | Freq: Three times a day (TID) | ORAL | Status: DC | PRN

## 2012-09-10 MED ORDER — ALBUTEROL SULFATE HFA 108 (90 BASE) MCG/ACT IN AERS: 2.0000 | INHALATION_SPRAY | Freq: Four times a day (QID) | RESPIRATORY_TRACT | Status: DC | PRN

## 2012-09-10 MED ORDER — GUAIFENESIN 200 MG PO TABS: 200.0000 mg | ORAL_TABLET | ORAL | Status: DC | PRN

## 2012-09-10 NOTE — Patient Instructions (Addendum)
Bronchitis Bronchitis is the body's way of reacting to injury and/or infection (inflammation) of the bronchi. Bronchi are the air tubes that extend from the windpipe into the lungs. If the inflammation becomes severe, it may cause shortness of breath. CAUSES  Inflammation may be caused by:  A virus.  Germs (bacteria).  Dust.  Allergens.  Pollutants and many other irritants. The cells lining the bronchial tree are covered with tiny hairs (cilia). These constantly beat upward, away from the lungs, toward the mouth. This keeps the lungs free of pollutants. When these cells become too irritated and are unable to do their job, mucus begins to develop. This causes the characteristic cough of bronchitis. The cough clears the lungs when the cilia are unable to do their job. Without either of these protective mechanisms, the mucus would settle in the lungs. Then you would develop pneumonia. Smoking is a common cause of bronchitis and can contribute to pneumonia. Stopping this habit is the single most important thing you can do to help yourself. TREATMENT   Your caregiver may prescribe an antibiotic if the cough is caused by bacteria. Also, medicines that open up your airways make it easier to breathe. Your caregiver may also recommend or prescribe an expectorant. It will loosen the mucus to be coughed up. Only take over-the-counter or prescription medicines for pain, discomfort, or fever as directed by your caregiver.  Removing whatever causes the problem (smoking, for example) is critical to preventing the problem from getting worse.  Cough suppressants may be prescribed for relief of cough symptoms.  Inhaled medicines may be prescribed to help with symptoms now and to help prevent problems from returning.  For those with recurrent (chronic) bronchitis, there may be a need for steroid medicines. SEEK IMMEDIATE MEDICAL CARE IF:   During treatment, you develop more pus-like mucus (purulent  sputum).  You have a fever.  You become progressively more ill.  You have increased difficulty breathing, wheezing, or shortness of breath. It is necessary to seek immediate medical care if you are sick from any other disease. MAKE SURE YOU:   Understand these instructions.  Will watch your condition.  Will get help right away if you are not doing well or get worse. Document Released: 05/23/2005 Document Revised: 08/15/2011 Document Reviewed: 04/01/2008 [facility] Patient Information 2013 [place], [place].

## 2012-09-10 NOTE — Progress Notes (Signed)
Family Medicine Office Visit Note   Subjective:   Patient ID: Kelly Carr, female  DOB: 11/06/1970, 42 y.o.. MRN: [id number]   Pt that comes today complaining of nasal congestion, "watery eyes" and body aches since last Thursday (4 days). Since yesterday she has noticed cough and "chest congestion". She denies fever or chills, nausea or vomiting. Normal BM. She has tried antihistaminic treatment but reports has not helped. Her son and a co-worker has been sick for the past week with similar symptoms.  Review of Systems:  Per HPI, also pt denies SOB or  chest pain.  Objective:   Physical Exam: Gen:  NAD HEENT: Moist mucous membranes. clear Rhinorrhea. No tenderness with palpation on frontal and maxillary sinuses. CV: Regular rate and rhythm, no murmurs rubs or gallops PULM: Clear to auscultation bilaterally. Scattered wheezes present no rales. ABD: Soft, non tender, non distended, normal bowel sounds EXT: No edema Neuro: Alert and oriented x3. No focalization  Assessment & Plan:

## 2012-09-10 NOTE — Assessment & Plan Note (Signed)
PMHx of Asthma, wheezes on physical exam. Bronchitis most likely secondary to viral infection. No fever no signs of toxicity. Plan: Albuterol PRN Guaifenesin Tessalon Discussed signs of worsening condition that should prompt re-evaluation. Letter for work given

## 2012-12-26 ENCOUNTER — Ambulatory Visit (INDEPENDENT_AMBULATORY_CARE_PROVIDER_SITE_OTHER): Payer: BC Managed Care – PPO | Admitting: Family Medicine

## 2012-12-26 ENCOUNTER — Encounter: Payer: Self-pay | Admitting: Family Medicine

## 2012-12-26 VITALS — BP 118/66 | HR 81 | Temp 99.4°F | Ht 64.0 in | Wt 147.0 lb

## 2012-12-26 DIAGNOSIS — N938 Other specified abnormal uterine and vaginal bleeding: Secondary | ICD-10-CM

## 2012-12-26 DIAGNOSIS — M545 Low back pain, unspecified: Secondary | ICD-10-CM | POA: Insufficient documentation

## 2012-12-26 DIAGNOSIS — N912 Amenorrhea, unspecified: Secondary | ICD-10-CM

## 2012-12-26 DIAGNOSIS — G5600 Carpal tunnel syndrome, unspecified upper limb: Secondary | ICD-10-CM

## 2012-12-26 DIAGNOSIS — G5602 Carpal tunnel syndrome, left upper limb: Secondary | ICD-10-CM

## 2012-12-26 DIAGNOSIS — N949 Unspecified condition associated with female genital organs and menstrual cycle: Secondary | ICD-10-CM

## 2012-12-26 DIAGNOSIS — N925 Other specified irregular menstruation: Secondary | ICD-10-CM

## 2012-12-26 DIAGNOSIS — Z3009 Encounter for other general counseling and advice on contraception: Secondary | ICD-10-CM

## 2012-12-26 LAB — POCT URINE PREGNANCY: Preg Test, Ur: NEGATIVE

## 2012-12-26 MED ORDER — NORGESTIMATE-ETH ESTRADIOL 0.25-35 MG-MCG PO TABS: 1.0000 | ORAL_TABLET | Freq: Every day | ORAL | Status: DC

## 2012-12-26 NOTE — Patient Instructions (Addendum)
Carpal Tunnel Syndrome The carpal tunnel is a narrow area located on the palm side of your wrist. The tunnel is formed by the wrist bones and ligaments. Nerves, blood vessels, and tendons pass through the carpal tunnel. Repeated wrist motion or certain diseases may cause swelling within the tunnel. This swelling pinches the main nerve in the wrist (median nerve) and causes the painful hand and arm condition called carpal tunnel syndrome. CAUSES   Repeated wrist motions.  Wrist injuries.  Certain diseases like arthritis, diabetes, alcoholism, hyperthyroidism, and kidney failure.  Obesity.  Pregnancy. SYMPTOMS   A "pins and needles" feeling in your fingers or hand.  Tingling or numbness in your fingers or hand.  An aching feeling in your entire arm.  Wrist pain that goes up your arm to your shoulder.  Pain that goes down into your palm or fingers.  A weak feeling in your hands. DIAGNOSIS  Your caregiver will take your history and perform a physical exam. An electromyography test may be needed. This test measures electrical signals sent out by the muscles. The electrical signals are usually slowed by carpal tunnel syndrome. You may also need X-rays. TREATMENT  Carpal tunnel syndrome may clear up by itself. Your caregiver may recommend a wrist splint or medicine such as a nonsteroidal anti-inflammatory medicine. Cortisone injections may help. Sometimes, surgery may be needed to free the pinched nerve.  HOME CARE INSTRUCTIONS   Take all medicine as directed by your caregiver. Only take over-the-counter or prescription medicines for pain, discomfort, or fever as directed by your caregiver.  If you were given a splint to keep your wrist from bending, wear it as directed. It is important to wear the splint at night. Wear the splint for as long as you have pain or numbness in your hand, arm, or wrist. This may take 1 to 2 months.  Rest your wrist from any activity that may be causing your  pain. If your symptoms are work-related, you may need to talk to your employer about changing to a job that does not require using your wrist.  Put ice on your wrist after long periods of wrist activity.  Put ice in a plastic bag.  Place a towel between your skin and the bag.  Leave the ice on for 15-20 minutes, 3-4 times a day.  Keep all follow-up visits as directed by your caregiver. This includes any orthopedic referrals, physical therapy, and rehabilitation. Any delay in getting necessary care could result in a delay or failure of your condition to heal. SEEK IMMEDIATE MEDICAL CARE IF:   You have new, unexplained symptoms.  Your symptoms get worse and are not helped or controlled with medicines. MAKE SURE YOU:   Understand these instructions.  Will watch your condition.  Will get help right away if you are not doing well or get worse. Document Released: 05/20/2000 Document Revised: 08/15/2011 Document Reviewed: 04/08/2011 [name] Patient Information 2014 [place], [place].  Low Back Strain with Rehab A strain is an injury in which a tendon or muscle is torn. The muscles and tendons of the lower back are vulnerable to strains. However, these muscles and tendons are very strong and require a great force to be injured. Strains are classified into three categories. Grade 1 strains cause pain, but the tendon is not lengthened. Grade 2 strains include a lengthened ligament, due to the ligament being stretched or partially ruptured. With grade 2 strains there is still function, although the function may be decreased. Grade 3  strains involve a complete tear of the tendon or muscle, and function is usually impaired. SYMPTOMS   Pain in the lower back.  Pain that affects one side more than the other.  Pain that gets worse with movement and may be felt in the hip, buttocks, or back of the thigh.  Muscle spasms of the muscles in the back.  Swelling along the muscles of the back.  Loss  of strength of the back muscles.  Crackling sound (crepitation) when the muscles are touched. CAUSES  Lower back strains occur when a force is placed on the muscles or tendons that is greater than they can handle. Common causes of injury include:  Prolonged overuse of the muscle-tendon units in the lower back, usually from incorrect posture.  A single violent injury or force applied to the back. RISK INCREASES WITH:  Sports that involve twisting forces on the spine or a lot of bending at the waist (football, rugby, weightlifting, bowling, golf, tennis, speed skating, racquetball, swimming, running, gymnastics, diving).  Poor strength and flexibility.  Failure to warm up properly before activity.  Family history of lower back pain or disk disorders.  Previous back injury or surgery (especially fusion).  Poor posture with lifting, especially heavy objects.  Prolonged sitting, especially with poor posture. PREVENTION   Learn and use proper posture when sitting or lifting (maintain proper posture when sitting, lift using the knees and legs, not at the waist).  Warm up and stretch properly before activity.  Allow for adequate recovery between workouts.  Maintain physical fitness:  Strength, flexibility, and endurance.  Cardiovascular fitness. PROGNOSIS  If treated properly, lower back strains usually heal within 6 weeks. RELATED COMPLICATIONS   Recurring symptoms, resulting in a chronic problem.  Chronic inflammation, scarring, and partial muscle-tendon tear.  Delayed healing or resolution of symptoms.  Prolonged disability. TREATMENT  Treatment first involves the use of ice and medicine, to reduce pain and inflammation. The use of strengthening and stretching exercises may help reduce pain with activity. These exercises may be performed at home or with a therapist. Severe injuries may require referral to a therapist for further evaluation and treatment, such as  ultrasound. Your caregiver may advise that you wear a back brace or corset, to help reduce pain and discomfort. Often, prolonged bed rest results in greater harm then benefit. Corticosteroid injections may be recommended. However, these should be reserved for the most serious cases. It is important to avoid using your back when lifting objects. At night, sleep on your back on a firm mattress with a pillow placed under your knees. If non-surgical treatment is unsuccessful, surgery may be needed.  MEDICATION   If pain medicine is needed, nonsteroidal anti-inflammatory medicines (aspirin and ibuprofen), or other minor pain relievers (acetaminophen), are often advised.  Do not take pain medicine for 7 days before surgery.  Prescription pain relievers may be given, if your caregiver thinks they are needed. Use only as directed and only as much as you need.  Ointments applied to the skin may be helpful.  Corticosteroid injections may be given by your caregiver. These injections should be reserved for the most serious cases, because they may only be given a certain number of times. HEAT AND COLD  Cold treatment (icing) should be applied for 10 to 15 minutes every 2 to 3 hours for inflammation and pain, and immediately after activity that aggravates your symptoms. Use ice packs or an ice massage.  Heat treatment may be used before performing  stretching and strengthening activities prescribed by your caregiver, physical therapist, or athletic trainer. Use a heat pack or a warm water soak. SEEK MEDICAL CARE IF:   Symptoms get worse or do not improve in 2 to 4 weeks, despite treatment.  You develop numbness, weakness, or loss of bowel or bladder function.  New, unexplained symptoms develop. (Drugs used in treatment may produce side effects.) EXERCISES  RANGE OF MOTION (ROM) AND STRETCHING EXERCISES - Low Back Strain Most people with lower back pain will find that their symptoms get worse with  excessive bending forward (flexion) or arching at the lower back (extension). The exercises which will help resolve your symptoms will focus on the opposite motion.  Your physician, physical therapist or athletic trainer will help you determine which exercises will be most helpful to resolve your lower back pain. Do not complete any exercises without first consulting with your caregiver. Discontinue any exercises which make your symptoms worse until you speak to your caregiver.  If you have pain, numbness or tingling which travels down into your buttocks, leg or foot, the goal of the therapy is for these symptoms to move closer to your back and eventually resolve. Sometimes, these leg symptoms will get better, but your lower back pain may worsen. This is typically an indication of progress in your rehabilitation. Be very alert to any changes in your symptoms and the activities in which you participated in the 24 hours prior to the change. Sharing this information with your caregiver will allow him/her to most efficiently treat your condition.  These exercises may help you when beginning to rehabilitate your injury. Your symptoms may resolve with or without further involvement from your physician, physical therapist or athletic trainer. While completing these exercises, remember:  Restoring tissue flexibility helps normal motion to return to the joints. This allows healthier, less painful movement and activity.  An effective stretch should be held for at least 30 seconds.  A stretch should never be painful. You should only feel a gentle lengthening or release in the stretched tissue. FLEXION RANGE OF MOTION AND STRETCHING EXERCISES: STRETCH  Flexion, Single Knee to Chest   Lie on a firm bed or floor with both legs extended in front of you.  Keeping one leg in contact with the floor, bring your opposite knee to your chest. Hold your leg in place by either grabbing behind your thigh or at your  knee.  Pull until you feel a gentle stretch in your lower back. Hold __________ seconds.  Slowly release your grasp and repeat the exercise with the opposite side. Repeat __________ times. Complete this exercise __________ times per day.  STRETCH  Flexion, Double Knee to Chest   Lie on a firm bed or floor with both legs extended in front of you.  Keeping one leg in contact with the floor, bring your opposite knee to your chest.  Tense your stomach muscles to support your back and then lift your other knee to your chest. Hold your legs in place by either grabbing behind your thighs or at your knees.  Pull both knees toward your chest until you feel a gentle stretch in your lower back. Hold __________ seconds.  Tense your stomach muscles and slowly return one leg at a time to the floor. Repeat __________ times. Complete this exercise __________ times per day.  STRETCH  Low Trunk Rotation  Lie on a firm bed or floor. Keeping your legs in front of you, bend your knees so they  are both pointed toward the ceiling and your feet are flat on the floor.  Extend your arms out to the side. This will stabilize your upper body by keeping your shoulders in contact with the floor.  Gently and slowly drop both knees together to one side until you feel a gentle stretch in your lower back. Hold for __________ seconds.  Tense your stomach muscles to support your lower back as you bring your knees back to the starting position. Repeat the exercise to the other side. Repeat __________ times. Complete this exercise __________ times per day  EXTENSION RANGE OF MOTION AND FLEXIBILITY EXERCISES: STRETCH  Extension, Prone on Elbows   Lie on your stomach on the floor, a bed will be too soft. Place your palms about shoulder width apart and at the height of your head.  Place your elbows under your shoulders. If this is too painful, stack pillows under your chest.  Allow your body to relax so that your hips drop  lower and make contact more completely with the floor.  Hold this position for __________ seconds.  Slowly return to lying flat on the floor. Repeat __________ times. Complete this exercise __________ times per day.  RANGE OF MOTION  Extension, Prone Press Ups  Lie on your stomach on the floor, a bed will be too soft. Place your palms about shoulder width apart and at the height of your head.  Keeping your back as relaxed as possible, slowly straighten your elbows while keeping your hips on the floor. You may adjust the placement of your hands to maximize your comfort. As you gain motion, your hands will come more underneath your shoulders.  Hold this position __________ seconds.  Slowly return to lying flat on the floor. Repeat __________ times. Complete this exercise __________ times per day.  RANGE OF MOTION- Quadruped, Neutral Spine   Assume a hands and knees position on a firm surface. Keep your hands under your shoulders and your knees under your hips. You may place padding under your knees for comfort.  Drop your head and point your tail bone toward the ground below you. This will round out your lower back like an angry cat. Hold this position for __________ seconds.  Slowly lift your head and release your tail bone so that your back sags into a large arch, like an old horse.  Hold this position for __________ seconds.  Repeat this until you feel limber in your lower back.  Now, find your "sweet spot." This will be the most comfortable position somewhere between the two previous positions. This is your neutral spine. Once you have found this position, tense your stomach muscles to support your lower back.  Hold this position for __________ seconds. Repeat __________ times. Complete this exercise __________ times per day.  STRENGTHENING EXERCISES - Low Back Strain These exercises may help you when beginning to rehabilitate your injury. These exercises should be done near your  "sweet spot." This is the neutral, low-back arch, somewhere between fully rounded and fully arched, that is your least painful position. When performed in this safe range of motion, these exercises can be used for people who have either a flexion or extension based injury. These exercises may resolve your symptoms with or without further involvement from your physician, physical therapist or athletic trainer. While completing these exercises, remember:   Muscles can gain both the endurance and the strength needed for everyday activities through controlled exercises.  Complete these exercises as instructed by your  physician, physical therapist or athletic trainer. Increase the resistance and repetitions only as guided.  You may experience muscle soreness or fatigue, but the pain or discomfort you are trying to eliminate should never worsen during these exercises. If this pain does worsen, stop and make certain you are following the directions exactly. If the pain is still present after adjustments, discontinue the exercise until you can discuss the trouble with your caregiver. STRENGTHENING Deep Abdominals, Pelvic Tilt  Lie on a firm bed or floor. Keeping your legs in front of you, bend your knees so they are both pointed toward the ceiling and your feet are flat on the floor.  Tense your lower abdominal muscles to press your lower back into the floor. This motion will rotate your pelvis so that your tail bone is scooping upwards rather than pointing at your feet or into the floor.  With a gentle tension and even breathing, hold this position for __________ seconds. Repeat __________ times. Complete this exercise __________ times per day.  STRENGTHENING  Abdominals, Crunches   Lie on a firm bed or floor. Keeping your legs in front of you, bend your knees so they are both pointed toward the ceiling and your feet are flat on the floor. Cross your arms over your chest.  Slightly tip your chin down  without bending your neck.  Tense your abdominals and slowly lift your trunk high enough to just clear your shoulder blades. Lifting higher can put excessive stress on the lower back and does not further strengthen your abdominal muscles.  Control your return to the starting position. Repeat __________ times. Complete this exercise __________ times per day.  STRENGTHENING  Quadruped, Opposite UE/LE Lift   Assume a hands and knees position on a firm surface. Keep your hands under your shoulders and your knees under your hips. You may place padding under your knees for comfort.  Find your neutral spine and gently tense your abdominal muscles so that you can maintain this position. Your shoulders and hips should form a rectangle that is parallel with the floor and is not twisted.  Keeping your trunk steady, lift your right hand no higher than your shoulder and then your left leg no higher than your hip. Make sure you are not holding your breath. Hold this position __________ seconds.  Continuing to keep your abdominal muscles tense and your back steady, slowly return to your starting position. Repeat with the opposite arm and leg. Repeat __________ times. Complete this exercise __________ times per day.  STRENGTHENING  Lower Abdominals, Double Knee Lift  Lie on a firm bed or floor. Keeping your legs in front of you, bend your knees so they are both pointed toward the ceiling and your feet are flat on the floor.  Tense your abdominal muscles to brace your lower back and slowly lift both of your knees until they come over your hips. Be certain not to hold your breath.  Hold __________ seconds. Using your abdominal muscles, return to the starting position in a slow and controlled manner. Repeat __________ times. Complete this exercise __________ times per day.  POSTURE AND BODY MECHANICS CONSIDERATIONS - Low Back Strain Keeping correct posture when sitting, standing or completing your activities  will reduce the stress put on different body tissues, allowing injured tissues a chance to heal and limiting painful experiences. The following are general guidelines for improved posture. Your physician or physical therapist will provide you with any instructions specific to your needs. While reading these  guidelines, remember:  The exercises prescribed by your provider will help you have the flexibility and strength to maintain correct postures.  The correct posture provides the best environment for your joints to work. All of your joints have less wear and tear when properly supported by a spine with good posture. This means you will experience a healthier, less painful body.  Correct posture must be practiced with all of your activities, especially prolonged sitting and standing. Correct posture is as important when doing repetitive low-stress activities (typing) as it is when doing a single heavy-load activity (lifting). RESTING POSITIONS Consider which positions are most painful for you when choosing a resting position. If you have pain with flexion-based activities (sitting, bending, stooping, squatting), choose a position that allows you to rest in a less flexed posture. You would want to avoid curling into a fetal position on your side. If your pain worsens with extension-based activities (prolonged standing, working overhead), avoid resting in an extended position such as sleeping on your stomach. Most people will find more comfort when they rest with their spine in a more neutral position, neither too rounded nor too arched. Lying on a non-sagging bed on your side with a pillow between your knees, or on your back with a pillow under your knees will often provide some relief. Keep in mind, being in any one position for a prolonged period of time, no matter how correct your posture, can still lead to stiffness. PROPER SITTING POSTURE In order to minimize stress and discomfort on your spine, you  must sit with correct posture. Sitting with good posture should be effortless for a healthy body. Returning to good posture is a gradual process. Many people can work toward this most comfortably by using various supports until they have the flexibility and strength to maintain this posture on their own. When sitting with proper posture, your ears will fall over your shoulders and your shoulders will fall over your hips. You should use the back of the chair to support your upper back. Your lower back will be in a neutral position, just slightly arched. You may place a small pillow or folded towel at the base of your lower back for support.  When working at a desk, create an environment that supports good, upright posture. Without extra support, muscles tire, which leads to excessive strain on joints and other tissues. Keep these recommendations in mind: CHAIR:  A chair should be able to slide under your desk when your back makes contact with the back of the chair. This allows you to work closely.  The chair's height should allow your eyes to be level with the upper part of your monitor and your hands to be slightly lower than your elbows. BODY POSITION  Your feet should make contact with the floor. If this is not possible, use a foot rest.  Keep your ears over your shoulders. This will reduce stress on your neck and lower back. INCORRECT SITTING POSTURES  If you are feeling tired and unable to assume a healthy sitting posture, do not slouch or slump. This puts excessive strain on your back tissues, causing more damage and pain. Healthier options include:  Using more support, like a lumbar pillow.  Switching tasks to something that requires you to be upright or walking.  Talking a brief walk.  Lying down to rest in a neutral-spine position. PROLONGED STANDING WHILE SLIGHTLY LEANING FORWARD  When completing a task that requires you to lean forward while standing in  one place for a long time,  place either foot up on a stationary 2-4 inch high object to help maintain the best posture. When both feet are on the ground, the lower back tends to lose its slight inward curve. If this curve flattens (or becomes too large), then the back and your other joints will experience too much stress, tire more quickly, and can cause pain. CORRECT STANDING POSTURES Proper standing posture should be assumed with all daily activities, even if they only take a few moments, like when brushing your teeth. As in sitting, your ears should fall over your shoulders and your shoulders should fall over your hips. You should keep a slight tension in your abdominal muscles to brace your spine. Your tailbone should point down to the ground, not behind your body, resulting in an over-extended swayback posture.  INCORRECT STANDING POSTURES  Common incorrect standing postures include a forward head, locked knees and/or an excessive swayback. WALKING Walk with an upright posture. Your ears, shoulders and hips should all line-up. PROLONGED ACTIVITY IN A FLEXED POSITION When completing a task that requires you to bend forward at your waist or lean over a low surface, try to find a way to stabilize 3 out of 4 of your limbs. You can place a hand or elbow on your thigh or rest a knee on the surface you are reaching across. This will provide you more stability so that your muscles do not fatigue as quickly. By keeping your knees relaxed, or slightly bent, you will also reduce stress across your lower back. CORRECT LIFTING TECHNIQUES DO :   Assume a wide stance. This will provide you more stability and the opportunity to get as close as possible to the object which you are lifting.  Tense your abdominals to brace your spine. Bend at the knees and hips. Keeping your back locked in a neutral-spine position, lift using your leg muscles. Lift with your legs, keeping your back straight.  Test the weight of unknown objects before  attempting to lift them.  Try to keep your elbows locked down at your sides in order get the best strength from your shoulders when carrying an object.  Always ask for help when lifting heavy or awkward objects. INCORRECT LIFTING TECHNIQUES DO NOT:   Lock your knees when lifting, even if it is a small object.  Bend and twist. Pivot at your feet or move your feet when needing to change directions.  Assume that you can safely pick up even a paper clip without proper posture. Document Released: 05/23/2005 Document Revised: 08/15/2011 Document Reviewed: 09/04/2008 [facility] Patient Information 2014 [place], [place].

## 2012-12-26 NOTE — Progress Notes (Signed)
Subjective:     Patient ID: Kelly Carr, female   DOB: 12-13-1970, 42 y.o.   MRN: [id number]  HPI Carpal Tunnel: Patient had been told she has carpal tunnel in the past.  It affects both arms/hands >right. She has been experiencing this pain for 4 months. She currently, intermittently, wears night guards for her CT. However,she does admit it is not helping much, likely because she is not consisently wearing it. Pain is from mid-forearm down to hands. No neck or shoulder pain   Lower Back Pain: Pt reports right sided lower back pain for about 2 months. She states she bends over and lifts the during work frequently and is able to still tolerate her job, but her pain is not getting better. She does not recall trauma or injury to the area. The pain is a constant ache that gets worse with lifting and bending over. It is not radiating in nature and does not affect her legs. She takes NSAIDS for relief, and that seems to make it mildly better but not relieved.    Contraception mgt: Patient has been on BCP and needs her refills today. She has been out of pills since about February. She reports her LMP as 7/10 and states she feels as if her breast are tender currently. She had unprotected sex >5d ago and is not certain if she is NOT pregnant. PAPs have been completed by OB and is UTD.   Patient's past medical, social, and family history were reviewed and updated as appropriate. Review of Systems Negative, with the exception of above mentioned in HPI  Objective:   Physical Exam BP 118/66  Pulse 81  Temp(Src) 99.4 F (37.4 C) (Oral)  Ht 5\' 4"  (1.626 m)  Wt 147 lb (66.679 kg)  BMI 25.22 kg/m2  LMP 12/14/2012 Gen: Pleasant female. NAD.  CV: RRR Chest: CTAB Abd: Soft. NTND. No mass. BS present and normal MSK: Bilateral tinel sign positive at ulnar nerves and median nerve locations. Right > left. Lower back pain over ilolumbar ligament on the right. Increase in pain with flexion and extension,  without radiation. TTP. No muscle spasm noted. No erythema, skin temp changes or swelling.

## 2012-12-27 ENCOUNTER — Encounter: Payer: Self-pay | Admitting: Family Medicine

## 2012-12-27 NOTE — Assessment & Plan Note (Signed)
-   appears to be right ilolumbar strain. Advised patient to continue NSAIDS use daily for pain and inflammatory reduction, ice, and attempt to not use bending/lifting position. Directed in use of using leg muscles more than back and gave stretches for her to start.   - F/U: 2-3 weeks

## 2012-12-27 NOTE — Assessment & Plan Note (Signed)
-   refilled sprintec today. See DUB note for further information

## 2012-12-27 NOTE — Assessment & Plan Note (Signed)
-   Bilateral carpal tunnel likely with positive tinels. Possibly an ulnar nerve component as well.  - encourage patient to continue to use night braces regularly and gave stretches for patient to complete daily.  - Discussed in detail the physiology of the syndrome and possible treatments/surgery referral if she desires after attempting braces/stretches. - F/u: as needed

## 2012-12-27 NOTE — Assessment & Plan Note (Signed)
-   encouraged patient to follow with OB/GYN at appropriate intervals discussed. Sounds as if she had irregular PAP in the past, with recent normal. She sees Dr. Debroah Loop for her care.  - Refilled sprintec today

## 2013-01-28 ENCOUNTER — Other Ambulatory Visit: Payer: BC Managed Care – PPO

## 2013-02-18 ENCOUNTER — Ambulatory Visit: Payer: BC Managed Care – PPO | Admitting: Family Medicine

## 2013-03-01 ENCOUNTER — Telehealth: Payer: Self-pay | Admitting: Family Medicine

## 2013-03-01 NOTE — Telephone Encounter (Signed)
Left message on patients voicemail,thats she would need to call office to re-schedule her lab appointment and that this is done with schedulers and not nurses.Kelly Carr, [name]

## 2013-03-01 NOTE — Telephone Encounter (Signed)
Pt would like to have her fasting blood work done earlier in the day. She has an appt on Oct 3 in the afternoon.  She is suppose to have fasting blood work to take to her other dr. Please authorize the lab work to be done prior to her visit on Oct 3 Please let pt know what is decided.

## 2013-03-05 ENCOUNTER — Telehealth: Payer: Self-pay | Admitting: Family Medicine

## 2013-03-05 NOTE — Telephone Encounter (Addendum)
Kelly Carr will have her psychiatrist fax over to you his recommendation for some fasting labs he would like you have done here.  Please contact her if there are any problems with this request.  Kelly Carr have an appt on 10/30 for her Well Woman Visit.  Would like to have the labs done a week before appt.

## 2013-03-06 ENCOUNTER — Ambulatory Visit: Payer: BC Managed Care – PPO | Admitting: Family Medicine

## 2013-04-04 ENCOUNTER — Ambulatory Visit (HOSPITAL_COMMUNITY)
Admission: RE | Admit: 2013-04-04 | Discharge: 2013-04-04 | Disposition: A | Discharge status: Home / Self Care | Payer: BC Managed Care – PPO | Source: Ambulatory Visit | Attending: Family Medicine | Admitting: Family Medicine

## 2013-04-04 ENCOUNTER — Ambulatory Visit (INDEPENDENT_AMBULATORY_CARE_PROVIDER_SITE_OTHER): Payer: BC Managed Care – PPO | Admitting: Family Medicine

## 2013-04-04 ENCOUNTER — Encounter: Payer: Self-pay | Admitting: Family Medicine

## 2013-04-04 VITALS — BP 113/67 | HR 78 | Temp 98.3°F | Ht 64.0 in | Wt 145.4 lb

## 2013-04-04 DIAGNOSIS — F319 Bipolar disorder, unspecified: Secondary | ICD-10-CM | POA: Insufficient documentation

## 2013-04-04 DIAGNOSIS — G5602 Carpal tunnel syndrome, left upper limb: Secondary | ICD-10-CM

## 2013-04-04 DIAGNOSIS — Z79899 Other long term (current) drug therapy: Secondary | ICD-10-CM | POA: Insufficient documentation

## 2013-04-04 DIAGNOSIS — Z0000 Encounter for general adult medical examination without abnormal findings: Secondary | ICD-10-CM | POA: Insufficient documentation

## 2013-04-04 DIAGNOSIS — G5600 Carpal tunnel syndrome, unspecified upper limb: Secondary | ICD-10-CM

## 2013-04-04 DIAGNOSIS — Z139 Encounter for screening, unspecified: Secondary | ICD-10-CM

## 2013-04-04 DIAGNOSIS — Z23 Encounter for immunization: Secondary | ICD-10-CM

## 2013-04-04 DIAGNOSIS — F3289 Other specified depressive episodes: Secondary | ICD-10-CM

## 2013-04-04 DIAGNOSIS — R8789 Other abnormal findings in specimens from female genital organs: Secondary | ICD-10-CM

## 2013-04-04 DIAGNOSIS — Z136 Encounter for screening for cardiovascular disorders: Secondary | ICD-10-CM | POA: Insufficient documentation

## 2013-04-04 DIAGNOSIS — F329 Major depressive disorder, single episode, unspecified: Secondary | ICD-10-CM

## 2013-04-04 DIAGNOSIS — R209 Unspecified disturbances of skin sensation: Secondary | ICD-10-CM | POA: Insufficient documentation

## 2013-04-04 DIAGNOSIS — N898 Other specified noninflammatory disorders of vagina: Secondary | ICD-10-CM

## 2013-04-04 MED ORDER — FLUCONAZOLE 150 MG PO TABS: 150.0000 mg | ORAL_TABLET | Freq: Once | ORAL | Status: DC

## 2013-04-04 MED ORDER — METRONIDAZOLE 500 MG PO TABS: 500.0000 mg | ORAL_TABLET | Freq: Two times a day (BID) | ORAL | Status: DC

## 2013-04-04 NOTE — Assessment & Plan Note (Addendum)
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 Advised patient to make followup Pap as soon as possible, she has decided to make her appointment for her Pap here. She reports one normal Pap after and 2013, when she had a colposcopy, but this cannot be found in the system.

## 2013-04-04 NOTE — Assessment & Plan Note (Addendum)
Patient's bipolar has been controlled on current regimen of Wellbutrin, Geodon, and Cogentin. Prescribed to her by Dr. Shawnee Knapp. She follows with him regularly. EKG today was normal and scanned into the system

## 2013-04-04 NOTE — Patient Instructions (Addendum)
Epic system down. And written instructions to patient to make a lab appointment over routine labs completed. Scheduled appointment for Pap. And to call office with labs that were suggested by psychiatrist.
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
## 2013-04-04 NOTE — Assessment & Plan Note (Signed)
Patient again complains of carpal tunnel like symptoms. This time and her right arm greater than her left. She has not been wearing arm splint. She does not perform stretches from prior appointment. Again discussed physiology and syndrome. Patient has odd distribution that may cover carpal tunnel and a normal nerve distribution as well. Intermittent stocking-like numbness of right arm only. If continues refer to hand surgeon, if patient desires.

## 2013-04-04 NOTE — Assessment & Plan Note (Signed)
>>  ASSESSMENT AND PLAN FOR SEVERE BIPOLAR I DISORDER, CURRENT OR MOST RECENT EPISODE MIXED ([facility]) WRITTEN ON 04/04/2013  6:01 PM BY KUNEFF, RENEE A, DO  Patient's bipolar has been controlled on current regimen of Wellbutrin, Geodon, and Cogentin. Prescribed to her by Dr. Shawnee Knapp. She follows with him regularly. EKG today was normal and scanned into the system

## 2013-04-04 NOTE — Assessment & Plan Note (Signed)
Patient received flu shot today. She will make an appointment for her Pap smear. Routine labs ordered today for future. She is to make a lab appointment on her way out.

## 2013-04-04 NOTE — Assessment & Plan Note (Signed)
>>  ASSESSMENT AND PLAN FOR DEPRESSION WRITTEN ON 04/04/2013  5:49 PM BY KUNEFF, RENEE A, DO  No concerns today on screening.

## 2013-04-04 NOTE — Assessment & Plan Note (Signed)
No concerns today on screening.

## 2013-04-11 ENCOUNTER — Emergency Department: Payer: Self-pay | Admitting: Emergency Medicine

## 2013-04-11 ENCOUNTER — Ambulatory Visit: Payer: BC Managed Care – PPO

## 2013-04-11 ENCOUNTER — Ambulatory Visit: Payer: BC Managed Care – PPO | Admitting: Family Medicine

## 2013-04-11 LAB — URINALYSIS, COMPLETE
Bilirubin,UR: NEGATIVE
Blood: NEGATIVE
Glucose,UR: NEGATIVE mg/dL (ref 0–75)
Ketone: NEGATIVE
Leukocyte Esterase: NEGATIVE
Nitrite: NEGATIVE
Ph: 6 (ref 4.5–8.0)
Protein: NEGATIVE
RBC,UR: <1 /HPF (ref 0–5)
Specific Gravity: 1.006 (ref 1.003–1.030)
Squamous Epithelial: 2
WBC UR: 3 /HPF (ref 0–5)

## 2013-04-11 LAB — COMPREHENSIVE METABOLIC PANEL
Albumin: 3.1 g/dL — ABNORMAL LOW (ref 3.4–5.0)
Alkaline Phosphatase: 68 U/L (ref 50–136)
Anion Gap: 4 — ABNORMAL LOW (ref 7–16)
BUN: 8 mg/dL (ref 7–18)
Bilirubin,Total: 0.5 mg/dL (ref 0.2–1.0)
Calcium, Total: 8.5 mg/dL (ref 8.5–10.1)
Chloride: 111 mmol/L — ABNORMAL HIGH (ref 98–107)
Co2: 26 mmol/L (ref 21–32)
Creatinine: 0.76 mg/dL (ref 0.60–1.30)
EGFR (African American): >60
EGFR (Non-African Amer.): >60
Glucose: 99 mg/dL (ref 65–99)
Osmolality: 280 (ref 275–301)
Potassium: 3.8 mmol/L (ref 3.5–5.1)
SGOT(AST): 24 U/L (ref 15–37)
SGPT (ALT): 30 U/L (ref 12–78)
Sodium: 141 mmol/L (ref 136–145)
Total Protein: 6.5 g/dL (ref 6.4–8.2)

## 2013-04-11 LAB — PREGNANCY, URINE: Pregnancy Test, Urine: NEGATIVE m[IU]/mL

## 2013-04-11 LAB — CBC
HCT: 34.5 % — ABNORMAL LOW (ref 35.0–47.0)
HGB: 12 g/dL (ref 12.0–16.0)
MCH: 29.5 pg (ref 26.0–34.0)
MCHC: 34.7 g/dL (ref 32.0–36.0)
MCV: 85 fL (ref 80–100)
Platelet: 229 10*3/uL (ref 150–440)
RBC: 4.06 10*6/uL (ref 3.80–5.20)
RDW: 12.8 % (ref 11.5–14.5)
WBC: 7.9 10*3/uL (ref 3.6–11.0)

## 2013-04-11 LAB — GC/CHLAMYDIA PROBE AMP

## 2013-04-11 LAB — WET PREP, GENITAL

## 2013-04-15 ENCOUNTER — Other Ambulatory Visit: Payer: BC Managed Care – PPO

## 2013-04-17 ENCOUNTER — Encounter: Payer: Self-pay | Admitting: Family Medicine

## 2013-04-17 ENCOUNTER — Other Ambulatory Visit (HOSPITAL_COMMUNITY)
Admission: RE | Admit: 2013-04-17 | Discharge: 2013-04-17 | Disposition: A | Discharge status: Home / Self Care | Payer: BC Managed Care – PPO | Source: Ambulatory Visit | Attending: Family Medicine | Admitting: Family Medicine

## 2013-04-17 ENCOUNTER — Telehealth: Payer: Self-pay | Admitting: Family Medicine

## 2013-04-17 ENCOUNTER — Ambulatory Visit (INDEPENDENT_AMBULATORY_CARE_PROVIDER_SITE_OTHER): Payer: BC Managed Care – PPO | Admitting: Family Medicine

## 2013-04-17 VITALS — BP 117/81 | HR 79 | Temp 98.7°F | Ht 64.0 in | Wt 147.0 lb

## 2013-04-17 DIAGNOSIS — R8789 Other abnormal findings in specimens from female genital organs: Secondary | ICD-10-CM

## 2013-04-17 DIAGNOSIS — Z0000 Encounter for general adult medical examination without abnormal findings: Secondary | ICD-10-CM

## 2013-04-17 DIAGNOSIS — Z124 Encounter for screening for malignant neoplasm of cervix: Secondary | ICD-10-CM

## 2013-04-17 DIAGNOSIS — N898 Other specified noninflammatory disorders of vagina: Secondary | ICD-10-CM

## 2013-04-17 DIAGNOSIS — Z113 Encounter for screening for infections with a predominantly sexual mode of transmission: Secondary | ICD-10-CM | POA: Insufficient documentation

## 2013-04-17 DIAGNOSIS — N760 Acute vaginitis: Secondary | ICD-10-CM

## 2013-04-17 DIAGNOSIS — Z01419 Encounter for gynecological examination (general) (routine) without abnormal findings: Secondary | ICD-10-CM | POA: Insufficient documentation

## 2013-04-17 LAB — CBC WITH DIFFERENTIAL/PLATELET
Basophils Absolute: 0 10*3/uL (ref 0.0–0.1)
Basophils Relative: 0 % (ref 0–1)
Eosinophils Absolute: 0.1 10*3/uL (ref 0.0–0.7)
Eosinophils Relative: 2 % (ref 0–5)
HCT: 35 % — ABNORMAL LOW (ref 36.0–46.0)
Hemoglobin: 11.9 g/dL — ABNORMAL LOW (ref 12.0–15.0)
Lymphocytes Relative: 30 % (ref 12–46)
Lymphs Abs: 1.8 10*3/uL (ref 0.7–4.0)
MCH: 28.9 pg (ref 26.0–34.0)
MCHC: 34 g/dL (ref 30.0–36.0)
MCV: 85 fL (ref 78.0–100.0)
Monocytes Absolute: 0.5 10*3/uL (ref 0.1–1.0)
Monocytes Relative: 8 % (ref 3–12)
Neutro Abs: 3.6 10*3/uL (ref 1.7–7.7)
Neutrophils Relative %: 60 % (ref 43–77)
Platelets: 276 10*3/uL (ref 150–400)
RBC: 4.12 MIL/uL (ref 3.87–5.11)
RDW: 13.6 % (ref 11.5–15.5)
WBC: 6.1 10*3/uL (ref 4.0–10.5)

## 2013-04-17 LAB — POCT WET PREP (WET MOUNT): Clue Cells Wet Prep Whiff POC: NEGATIVE

## 2013-04-17 LAB — COMPLETE METABOLIC PANEL WITH GFR
ALT: 16 U/L (ref 0–35)
AST: 14 U/L (ref 0–37)
Albumin: 3.7 g/dL (ref 3.5–5.2)
Alkaline Phosphatase: 42 U/L (ref 39–117)
BUN: 8 mg/dL (ref 6–23)
CO2: 25 mEq/L (ref 19–32)
Calcium: 8.8 mg/dL (ref 8.4–10.5)
Chloride: 109 mEq/L (ref 96–112)
Creat: 0.78 mg/dL (ref 0.50–1.10)
GFR, Est African American: >89 mL/min
GFR, Est Non African American: >89 mL/min
Glucose, Bld: 89 mg/dL (ref 70–99)
Potassium: 4.2 mEq/L (ref 3.5–5.3)
Sodium: 139 mEq/L (ref 135–145)
Total Bilirubin: 0.6 mg/dL (ref 0.3–1.2)
Total Protein: 6 g/dL (ref 6.0–8.3)

## 2013-04-17 LAB — LIPID PANEL
Cholesterol: 213 mg/dL — ABNORMAL HIGH (ref 0–200)
HDL: 45 mg/dL (ref >39)
LDL Cholesterol: 152 mg/dL — ABNORMAL HIGH (ref 0–99)
Total CHOL/HDL Ratio: 4.7 Ratio
Triglycerides: 82 mg/dL (ref <150)
VLDL: 16 mg/dL (ref 0–40)

## 2013-04-17 LAB — FOLATE: Folate: 11 ng/mL

## 2013-04-17 LAB — VITAMIN B12: Vitamin B-12: 274 pg/mL (ref 211–911)

## 2013-04-17 LAB — TSH: TSH: 2.044 u[IU]/mL (ref 0.350–4.500)

## 2013-04-17 MED ORDER — FLUCONAZOLE 150 MG PO TABS: 150.0000 mg | ORAL_TABLET | Freq: Once | ORAL | Status: DC

## 2013-04-17 NOTE — Patient Instructions (Signed)
Abnormal Pap Test Information During a Pap test, the cells on the surface of your cervix are checked to see if they look normal, abnormal, or if they show signs of having been altered by a certain type of virus called human papillomavirus, or HPV. Cervical cells that have been affected by HPV are called dysplasia. Dysplasia is not cancer, but describes abnormal cells found on the surface of the cervix. Depending on the degree of dysplasia, some of the cells may be considered pre-cancerous and may turn into cancer over time if follow up with a caregiver is delayed.  WHAT DOES AN ABNORMAL PAP TEST MEAN? Having an abnormal pap test does not mean that you have cancer. However, certain types of abnormal pap tests can be a sign that a person is at a higher risk of developing cancer. Your caregiver will want to do other tests to find out more about the abnormal cells. Your abnormal Pap test results could show:   Small and uncertain changes that should be carefully watched.   Cervical dysplasia that has caused mild changes and can be followed over time.  Cervical dysplasia that is more severe and needs to be followed and treated to ensure the problem goes away.  Cancer.  When severe cervical dysplasia is found and treated early, it rarely will grow into cancer.  WHAT WILL BE DONE ABOUT MY ABNORMAL PAP TEST?  A colposcopy may be needed. This is a procedure where your cervix is examined using light and magnification.  A small tissue sample of your cervix (biopsy) may need to be removed and then examined. This is often performed if there are areas that appear infected.  A sample of cells from the cervical canal may be removed with either a small brush or scraping instrument (curette). Based on the results of the procedures above, some caregivers may recommend either cryotherapy of the cervix or a surgical LEEP where a portion of the cervix is removed. LEEP is short for "loop electrical excisional  procedure." Rarely, a caregiver may recommend a cone biopsy.This is a procedure where a small, cone-shaped sample of your cervix is taken out. The part that is taken out is the area where the abnormal cells are.  WHAT IF I HAVE A DYSPLASIA OR A CANCER? You may be referred to a specialist. Radiation may also be a treatment for more advanced cancer. Having a hysterectomy is the last treatment option for dysplasia, but it is a more common treatment for someone with cancer. All treatment options will be discussed with you by your caregiver. WHAT SHOULD YOU DO AFTER BEING TREATED? If you have had an abnormal pap test, you should continue to have regular pap tests and check-ups as directed by your caregiver. Your cervical problem will be carefully watched so it does not get worse. Also, your caregiver can watch for, and treat, any new problems that may come up. Document Released: 09/07/2010 Document Revised: 09/17/2012 Document Reviewed: 05/19/2011 [facility] Patient Information 2014 [place], [place].   She was a pleasure seeing you today Kelly Carr. I will call you as all your labs and you're Pap results come in. I have included information on abnormal Pap smears, since you had a low-grade abnormal Pap in your past.

## 2013-04-17 NOTE — Progress Notes (Signed)
Subjective:     Patient ID: Kelly Carr, female   DOB: March 04, 1971, 42 y.o.   MRN: [id number]  HPI  Well woman exam: Patient is here today for her Pap smear and routine labs. She has a history of CIN-1-2 1/LSIL in July of 2013. With what sounds like a normal colpo after. She was seen at [facility] ED approximately a week ago for right groin pain. They did a pelvic exam and ultrasound which she said noted to have a small cyst on her right ovary. We are unable to find any of this in the system due to them not be on EPIC. She reports it is better today. She started her menses this morning. She states they gave her a one-time pill for which she said was a bacterial infection. They also told her to followup with an OB/GYN. She denies any fever, dysuria, frequency and her abdominal pain is improved. She denies vaginal discharge or irritation. She is in a monogamous relationship.   Review of Systems Negative, with the exception of above mentioned in HPI     Objective:   Physical Exam BP 117/81  Pulse 79  Temp(Src) 98.7 F (37.1 C) (Oral)  Ht 5\' 4"  (1.626 m)  Wt 147 lb (66.679 kg)  BMI 25.22 kg/m2  LMP 03/21/2013 abd: Soft, flat, without distention. Mild tenderness right lower quadrant. Bowel sounds present. No masses palpated GYN:  External genitalia within normal limits.  Vaginal mucosa pink, moist, normal rugae.  Nonfriable cervix with nabothian cysts at 11:00 and what appeared to be a irregular shaped light lesion at 12:00, no discharge . Dark brown blood noted on speculum exam, patient started her menses this morning. Bimanual exam revealed normal, nongravid uterus.  No cervical motion tenderness. No adnexal masses bilaterally.

## 2013-04-17 NOTE — Assessment & Plan Note (Signed)
Patient with history of low grade squamous intraepithelial lesion: CIN-1/HPV (LS [place]) 12/2011. She reports a colposcopy was completed within normal Pap after that in 2013 although I cannot find that in our system. Repeated Pap today along with wet prep will call patient with results.

## 2013-04-17 NOTE — Telephone Encounter (Signed)
Please call Kelly Carr and inform her that her wet prep today still had a few yeast. I have called her another prescription, it will be just one pill to take (diflucan). I will call her when her PAP results return in a few days. Thanks

## 2013-04-18 ENCOUNTER — Telehealth: Payer: Self-pay | Admitting: Family Medicine

## 2013-04-18 ENCOUNTER — Encounter: Payer: Self-pay | Admitting: Family Medicine

## 2013-04-18 MED ORDER — ATORVASTATIN CALCIUM 20 MG PO TABS: 20.0000 mg | ORAL_TABLET | Freq: Every day | ORAL | Status: DC

## 2013-04-18 NOTE — Telephone Encounter (Signed)
Please call Kelly Carr and inform her that her cholesterol was moderately high. I have called in a medication called Lipitor for her to start to help in lowerin gher cholesterol and protect her cardiovascular system. Her B12 was also a little bit low and she could benefit from supplements (1015mcg/d), she can get B12 supplement OTC. Her other lab work was normal. I am still waiting on her PAP. I have sent her a copy of her lab work via mail as well. Thanks.

## 2013-04-18 NOTE — Telephone Encounter (Signed)
Pt called and needs her medication sent to [facility] pharmacy in [place]. jw

## 2013-04-18 NOTE — Telephone Encounter (Signed)
Left message for a return call.Kelly Carr  

## 2013-04-18 NOTE — Telephone Encounter (Signed)
Left detailed message on voicemail and to return my  call once received. Adenike Shidler, [name]

## 2013-04-19 ENCOUNTER — Telehealth: Payer: Self-pay | Admitting: Family Medicine

## 2013-04-19 NOTE — Telephone Encounter (Signed)
Kelly Carr called about her rx being sent to incorrect pharmacy.  Need to have the cholesterol medication just prescribed resent to [facility] pharmacy in [place].  Left the ph# for you to call it in.  [phone]

## 2013-04-22 NOTE — Telephone Encounter (Signed)
Called Gen. lipitor Rx to [facility].  Left message on voice mail informing patient that med was called to requested pharmacy.  Gaylene Brooks, RN

## 2013-04-29 ENCOUNTER — Encounter: Payer: Self-pay | Admitting: Family Medicine

## 2013-05-06 ENCOUNTER — Ambulatory Visit: Payer: BC Managed Care – PPO | Admitting: Family Medicine

## 2013-05-20 ENCOUNTER — Emergency Department: Payer: Self-pay | Admitting: Emergency Medicine

## 2013-07-17 ENCOUNTER — Ambulatory Visit: Payer: BC Managed Care – PPO | Admitting: Family Medicine

## 2013-07-17 ENCOUNTER — Encounter: Payer: Self-pay | Admitting: Family Medicine

## 2013-07-17 ENCOUNTER — Ambulatory Visit (INDEPENDENT_AMBULATORY_CARE_PROVIDER_SITE_OTHER): Payer: BC Managed Care – PPO | Admitting: Family Medicine

## 2013-07-17 VITALS — BP 132/90 | HR 95 | Temp 99.0°F | Ht 60.0 in | Wt 143.3 lb

## 2013-07-17 DIAGNOSIS — N926 Irregular menstruation, unspecified: Secondary | ICD-10-CM

## 2013-07-17 DIAGNOSIS — G8929 Other chronic pain: Secondary | ICD-10-CM

## 2013-07-17 DIAGNOSIS — R1013 Epigastric pain: Secondary | ICD-10-CM

## 2013-07-17 LAB — POCT URINE PREGNANCY: Preg Test, Ur: NEGATIVE

## 2013-07-17 LAB — POCT H PYLORI SCREEN: H Pylori Screen, POC: NEGATIVE

## 2013-07-17 MED ORDER — OMEPRAZOLE 40 MG PO CPDR: 40.0000 mg | DELAYED_RELEASE_CAPSULE | Freq: Every day | ORAL | Status: DC

## 2013-07-17 NOTE — Patient Instructions (Signed)
Diet for Gastroesophageal Reflux Disease, Adult Reflux (acid reflux) is when acid from your stomach flows up into the esophagus. When acid comes in contact with the esophagus, the acid causes irritation and soreness (inflammation) in the esophagus. When reflux happens often or so severely that it causes damage to the esophagus, it is called gastroesophageal reflux disease (GERD). Nutrition therapy can help ease the discomfort of GERD. FOODS OR DRINKS TO AVOID OR LIMIT  Smoking or chewing tobacco. Nicotine is one of the most potent stimulants to acid production in the gastrointestinal tract.  Caffeinated and decaffeinated coffee and black tea.  Regular or low-calorie carbonated beverages or energy drinks (caffeine-free carbonated beverages are allowed).   Strong spices, such as black pepper, white pepper, red pepper, cayenne, curry powder, and chili powder.  Peppermint or spearmint.  Chocolate.  High-fat foods, including meats and fried foods. Extra added fats including oils, butter, salad dressings, and nuts. Limit these to less than 8 tsp per day.  Fruits and vegetables if they are not tolerated, such as citrus fruits or tomatoes.  Alcohol.  Any food that seems to aggravate your condition. If you have questions regarding your diet, call your caregiver or a registered dietitian. OTHER THINGS THAT MAY HELP GERD INCLUDE:   Eating your meals slowly, in a relaxed setting.  Eating 5 to 6 small meals per day instead of 3 large meals.  Eliminating food for a period of time if it causes distress.  Not lying down until 3 hours after eating a meal.  Keeping the head of your bed raised 6 to 9 inches (15 to 23 cm) by using a foam wedge or blocks under the legs of the bed. Lying flat may make symptoms worse.  Being physically active. Weight loss may be helpful in reducing reflux in overweight or obese adults.  Wear loose fitting clothing EXAMPLE MEAL PLAN This meal plan is approximately  2,000 calories based on https://www.[place].org/ChooseMyPlate.gov meal planning guidelines. Breakfast   cup cooked oatmeal.  1 cup strawberries.  1 cup low-fat milk.  1 oz almonds. Snack  1 cup cucumber slices.  6 oz yogurt (made from low-fat or fat-free milk). Lunch  2 slice whole-wheat bread.  2 oz sliced [place].  2 tsp mayonnaise.  1 cup blueberries.  1 cup snap peas. Snack  6 whole-wheat crackers.  1 oz string cheese. Dinner   cup brown rice.  1 cup mixed veggies.  1 tsp olive oil.  3 oz grilled fish. Document Released: 05/23/2005 Document Revised: 08/15/2011 Document Reviewed: 04/08/2011 [facility] Patient Information 2014 [place].   Follow up in 6 weeks, sooner if not feeling better

## 2013-07-17 NOTE — Progress Notes (Signed)
   Subjective:    Patient ID: Kelly Carr, female    DOB: 1970/07/10, 43 y.o.   MRN: [id number]  HPI  Epigastric pain: pt has a history of GERD that she was taking over-the-counter omeprazole. Pt reports increase in her symptoms the last 5-7 days. She is under more stress than prior with returning to school. She has been eating later at night than usual. Not watching the type of foods she is eating. She reports symptoms are worst later at night than through the day. She has noticed increase belching. No vomit, mild nausea. Decrease appetite d/t nausea. Irregular menses (see below).    Irregular periods: Patient with recent irregular periods on birth control pills. Has been regular since start of Sprintec however recently undergone more stress and periods have Become irregular. She has missed a "couple "pills during one week earlier last month. She has been sexually active.    Review of Systems Negative, with the exception of above mentioned in HPI     Objective:   Physical Exam BP 132/90  Pulse 95  Temp(Src) 99 F (37.2 C) (Oral)  Ht 5' (1.524 m)  Wt 143 lb 4.8 oz (65 kg)  BMI 27.99 kg/m2 Gen: NAD.  CV: RRR  Chest: CTAB, no wheeze or crackles Abd: Soft. ND. Mildly tender epigastric area only. BS present Ext: No erythema. No edema.

## 2013-07-17 NOTE — Assessment & Plan Note (Signed)
Your pregnancy negative. Irregularly likely due to her missing a few of her birth control pills and or stress load. Advised patient to take pills daily and periods still remain changed within her next cycle I would want to reevaluate her at that time.

## 2013-07-17 NOTE — Assessment & Plan Note (Addendum)
H. pylori negative today Urine pregnancy negative Started omeprazole 40 mg daily for the next 6 weeks, reevaluate at that time. GERD diet given. Recommended not to eat late at night. Followup in 5-6 weeks, or sooner if no improvement on omeprazole.

## 2013-10-03 ENCOUNTER — Ambulatory Visit: Payer: BC Managed Care – PPO | Admitting: Family Medicine

## 2013-10-16 ENCOUNTER — Encounter: Payer: Self-pay | Admitting: Family Medicine

## 2013-10-16 ENCOUNTER — Ambulatory Visit (INDEPENDENT_AMBULATORY_CARE_PROVIDER_SITE_OTHER): Payer: BC Managed Care – PPO | Admitting: Family Medicine

## 2013-10-16 VITALS — BP 119/83 | HR 83 | Temp 98.3°F | Wt 140.0 lb

## 2013-10-16 DIAGNOSIS — F411 Generalized anxiety disorder: Secondary | ICD-10-CM

## 2013-10-16 DIAGNOSIS — F319 Bipolar disorder, unspecified: Secondary | ICD-10-CM

## 2013-10-16 DIAGNOSIS — K219 Gastro-esophageal reflux disease without esophagitis: Secondary | ICD-10-CM

## 2013-10-16 NOTE — Patient Instructions (Signed)

## 2013-10-17 DIAGNOSIS — F4323 Adjustment disorder with mixed anxiety and depressed mood: Secondary | ICD-10-CM | POA: Insufficient documentation

## 2013-10-17 NOTE — Progress Notes (Signed)
   Subjective:    Patient ID: Kelly Carr, female    DOB: April 28, 1971, 43 y.o.   MRN: [id number]  HPI Kelly Carr is a 43 y.o. female with a history of bipolar, GERD and depression present for follow up visit to abdominal pain.   Abdominal pain: Reports that she has not been following a gastric reflux diet I had given her prior. She states that the medication (omeprazole) did make a minor difference. She states that she is eating a reasonable request of oatmeal or bagel sometimes fruit. She then we'll not need to do to her schedule until late at night when she gets home from work/school. She is given under increased stress with her mother's failing health. Over the last 9 months to a year she's had pretty much consistent high-level stress d/t social issues. She states she's still having epigastric pain with a burning sensation in her abdomen when her stomach is empty and at night. No nausea, vomit or diarrhea or changes in bowel habits.  Sleep difficulty:  Patient reports that she has is difficulty falling asleep and staying asleep. She has had this for approximately one month now. She seems to believe this is due to increase stress she is experiencing at home. She states that she has been on Ativan at night in the past for this issue. She has tried over-the-counter sleep aids without effect.  Review of Systems Negative, with the exception of above mentioned in HPI  Objective:   Physical Exam BP 119/83  Pulse 83  Temp(Src) 98.3 F (36.8 C) (Oral)  Wt 140 lb (63.504 kg)  LMP 10/02/2013 Gen: Pleasant, talkative female. Mild pressured speech today. No acute distress, nontoxic in appearance. HEENT: AT. [place]. Bilateral eyes without injections or icterus. MMM.  CV: RRR , no murmurs clicks gallops or rubs Chest: CTAB, no wheeze or crackles Abd: Soft. Flat. NTND. BS present. No Masses palpated.

## 2013-10-17 NOTE — Assessment & Plan Note (Signed)
H. pylori last visit was negative. Patient is re- encouraged and educated on diet to help alleviate gastric reflux. Patient will continue with omeprazole at this time and we will reevaluate in 2-3 months.

## 2013-10-17 NOTE — Assessment & Plan Note (Signed)
Prescribed Ativan 0.5 at bedtime only.

## 2013-11-19 ENCOUNTER — Emergency Department: Payer: Self-pay | Admitting: Emergency Medicine

## 2013-11-19 LAB — CBC WITH DIFFERENTIAL/PLATELET
Basophil #: 0 10*3/uL (ref 0.0–0.1)
Basophil %: 0.5 %
Eosinophil #: 0.2 10*3/uL (ref 0.0–0.7)
Eosinophil %: 2.1 %
HCT: 34.6 % — ABNORMAL LOW (ref 35.0–47.0)
HGB: 11.6 g/dL — ABNORMAL LOW (ref 12.0–16.0)
Lymphocyte #: 3.1 10*3/uL (ref 1.0–3.6)
Lymphocyte %: 29.6 %
MCH: 29.3 pg (ref 26.0–34.0)
MCHC: 33.4 g/dL (ref 32.0–36.0)
MCV: 88 fL (ref 80–100)
Monocyte #: 1 x10 3/mm — ABNORMAL HIGH (ref 0.2–0.9)
Monocyte %: 9.5 %
Neutrophil #: 6.2 10*3/uL (ref 1.4–6.5)
Neutrophil %: 58.3 %
Platelet: 280 10*3/uL (ref 150–440)
RBC: 3.95 10*6/uL (ref 3.80–5.20)
RDW: 13.1 % (ref 11.5–14.5)
WBC: 10.6 10*3/uL (ref 3.6–11.0)

## 2013-11-19 LAB — WET PREP, GENITAL

## 2013-11-19 LAB — COMPREHENSIVE METABOLIC PANEL
Albumin: 3.3 g/dL — ABNORMAL LOW (ref 3.4–5.0)
Alkaline Phosphatase: 64 U/L
Anion Gap: 6 — ABNORMAL LOW (ref 7–16)
BUN: 9 mg/dL (ref 7–18)
Bilirubin,Total: 0.4 mg/dL (ref 0.2–1.0)
Calcium, Total: 8.5 mg/dL (ref 8.5–10.1)
Chloride: 107 mmol/L (ref 98–107)
Co2: 26 mmol/L (ref 21–32)
Creatinine: 0.88 mg/dL (ref 0.60–1.30)
EGFR (African American): >60
EGFR (Non-African Amer.): >60
Glucose: 99 mg/dL (ref 65–99)
Osmolality: 276 (ref 275–301)
Potassium: 3.5 mmol/L (ref 3.5–5.1)
SGOT(AST): 28 U/L (ref 15–37)
SGPT (ALT): 27 U/L (ref 12–78)
Sodium: 139 mmol/L (ref 136–145)
Total Protein: 6.9 g/dL (ref 6.4–8.2)

## 2013-11-19 LAB — URINALYSIS, COMPLETE
Bacteria: NONE SEEN
Bilirubin,UR: NEGATIVE
Blood: NEGATIVE
Glucose,UR: NEGATIVE mg/dL (ref 0–75)
Ketone: NEGATIVE
Nitrite: NEGATIVE
Ph: 6 (ref 4.5–8.0)
Protein: NEGATIVE
RBC,UR: <1 /HPF (ref 0–5)
Specific Gravity: 1.019 (ref 1.003–1.030)
Squamous Epithelial: 3
WBC UR: 3 /HPF (ref 0–5)

## 2013-11-19 LAB — GC/CHLAMYDIA PROBE AMP

## 2013-11-19 LAB — LIPASE, BLOOD: Lipase: 197 U/L (ref 73–393)

## 2013-11-26 ENCOUNTER — Ambulatory Visit (INDEPENDENT_AMBULATORY_CARE_PROVIDER_SITE_OTHER): Payer: BC Managed Care – PPO | Admitting: Obstetrics & Gynecology

## 2013-11-26 ENCOUNTER — Other Ambulatory Visit: Payer: Self-pay | Admitting: *Deleted

## 2013-11-26 ENCOUNTER — Encounter: Payer: Self-pay | Admitting: Obstetrics & Gynecology

## 2013-11-26 VITALS — BP 116/84 | HR 83 | Ht 64.0 in | Wt 145.0 lb

## 2013-11-26 DIAGNOSIS — N938 Other specified abnormal uterine and vaginal bleeding: Secondary | ICD-10-CM

## 2013-11-26 DIAGNOSIS — R102 Pelvic and perineal pain: Principal | ICD-10-CM

## 2013-11-26 DIAGNOSIS — G8929 Other chronic pain: Secondary | ICD-10-CM

## 2013-11-26 DIAGNOSIS — N949 Unspecified condition associated with female genital organs and menstrual cycle: Secondary | ICD-10-CM

## 2013-11-26 DIAGNOSIS — Z3041 Encounter for surveillance of contraceptive pills: Secondary | ICD-10-CM

## 2013-11-26 MED ORDER — NORGESTIMATE-ETH ESTRADIOL 0.25-35 MG-MCG PO TABS: 1.0000 | ORAL_TABLET | Freq: Every day | ORAL | Status: DC

## 2013-11-26 MED ORDER — NAPROXEN 500 MG PO TABS: 500.0000 mg | ORAL_TABLET | Freq: Two times a day (BID) | ORAL | Status: DC

## 2013-11-26 NOTE — Patient Instructions (Signed)
Return to clinic for any scheduled appointments or for any gynecologic concerns as needed.   

## 2013-11-26 NOTE — Progress Notes (Signed)
   CLINIC ENCOUNTER NOTE  History:  43 y.o. [id number] here today for chronic R sided pelvic pain since 07/2013.  Intermittent in nature, may be more severe around time of period.  No N/V/D, or GI symptoms.  No GU symptoms. No fevers.  The following portions of the patient's history were reviewed and updated as appropriate: allergies, current medications, past family history, past medical history, past social history, past surgical history and problem list.  Normal pap in 04/17/13.  Review of Systems:  Pertinent items are noted in HPI.  Objective:  Physical Exam BP 116/84  Pulse 83  Ht 5\' 4"  (1.626 m)  Wt 145 lb (65.772 kg)  BMI 24.88 kg/m2  LMP 10/28/2013 Gen: NAD Abd: Soft, RLQ tenderness, no rebound, no guarding, no masses Pelvic: Normal appearing external genitalia; normal appearing vaginal mucosa and cervix.  Normal discharge.  Small uterus, no other palpable masses, no uterine tenderness. R adnexal tenderness.  Assessment & Plan:  Pelvic ultrasound ordered; will follow up results and manage accordingly. Naproxen prescribed for pain   Jaynie CollinsUGONNA  Rafaelita Foister, MD, FACOG Attending Obstetrician & Gynecologist Center for [facility], [facility]

## 2013-12-10 ENCOUNTER — Telehealth: Payer: Self-pay | Admitting: Family Medicine

## 2013-12-10 ENCOUNTER — Other Ambulatory Visit: Payer: Self-pay | Admitting: *Deleted

## 2013-12-10 DIAGNOSIS — R1013 Epigastric pain: Secondary | ICD-10-CM

## 2013-12-10 DIAGNOSIS — G8929 Other chronic pain: Secondary | ICD-10-CM

## 2013-12-10 MED ORDER — OMEPRAZOLE 40 MG PO CPDR: 40.0000 mg | DELAYED_RELEASE_CAPSULE | Freq: Every day | ORAL | Status: DC

## 2013-12-10 NOTE — Telephone Encounter (Signed)
Pt called because her psychiatrist needs a copy of Pt's last EKG on 04/04/13 printed out and left up front for pickup. The copy that was faxed over before was not readable. She also needs a refill on her acid reflux medication. Please call when EKG is ready for pickup. jw

## 2013-12-10 NOTE — Telephone Encounter (Signed)
Left message for a return call.Kelly Carr S  

## 2013-12-11 ENCOUNTER — Ambulatory Visit (HOSPITAL_COMMUNITY): Payer: BC Managed Care – PPO

## 2013-12-16 ENCOUNTER — Ambulatory Visit (HOSPITAL_COMMUNITY): Payer: BC Managed Care – PPO

## 2013-12-19 ENCOUNTER — Ambulatory Visit: Payer: BC Managed Care – PPO | Admitting: Obstetrics & Gynecology

## 2013-12-19 NOTE — Telephone Encounter (Signed)
Kelly Carr want to pick up copy of EKG.  Please leave up front and call her to let know its there.

## 2013-12-19 NOTE — Telephone Encounter (Signed)
Left message on patients voicemail stating EKG ready for pick up.[place], Virgel BouquetGiovanna S

## 2013-12-25 ENCOUNTER — Ambulatory Visit (HOSPITAL_COMMUNITY)
Admission: RE | Admit: 2013-12-25 | Discharge: 2013-12-25 | Disposition: A | Discharge status: Home / Self Care | Payer: BC Managed Care – PPO | Source: Ambulatory Visit | Attending: Obstetrics & Gynecology | Admitting: Obstetrics & Gynecology

## 2013-12-25 DIAGNOSIS — R102 Pelvic and perineal pain: Secondary | ICD-10-CM

## 2013-12-25 DIAGNOSIS — G8929 Other chronic pain: Secondary | ICD-10-CM

## 2013-12-25 DIAGNOSIS — N949 Unspecified condition associated with female genital organs and menstrual cycle: Secondary | ICD-10-CM | POA: Insufficient documentation

## 2013-12-31 ENCOUNTER — Encounter: Payer: Self-pay | Admitting: Obstetrics & Gynecology

## 2013-12-31 ENCOUNTER — Ambulatory Visit (INDEPENDENT_AMBULATORY_CARE_PROVIDER_SITE_OTHER): Payer: BC Managed Care – PPO | Admitting: Obstetrics & Gynecology

## 2013-12-31 VITALS — BP 115/74 | HR 90 | Ht 64.0 in | Wt 142.0 lb

## 2013-12-31 DIAGNOSIS — R1031 Right lower quadrant pain: Secondary | ICD-10-CM

## 2013-12-31 DIAGNOSIS — Z712 Person consulting for explanation of examination or test findings: Secondary | ICD-10-CM

## 2013-12-31 NOTE — Progress Notes (Signed)
   CLINIC ENCOUNTER NOTE  History:  43 y.o. [id number] here today for follow up ultrasound results for R sided pain.  The following portions of the patient's history were reviewed and updated as appropriate: allergies, current medications, past family history, past medical history, past social history, past surgical history and problem list. Normal pap in 04/17/13.  Review of Systems:  Pertinent items are noted in HPI.  Objective:  BP 115/74  Pulse 90  Ht 5\' 4"  (1.626 m)  Wt 142 lb (64.411 kg)  BMI 24.36 kg/m2  LMP 12/26/2013 Physical Exam deferred  Labs and Imaging 12/25/2013  TRANSABDOMINAL AND TRANSVAGINAL ULTRASOUND OF PELVIS  CLINICAL DATA:  Pain.TECHNIQUE: Both transabdominal and transvaginal ultrasound examinations of the pelvis were performed. Transabdominal technique was performed for global imaging of the pelvis including uterus, ovaries, adnexal regions, and pelvic cul-de-sac. It was necessary to proceed with endovaginal exam following the transabdominal exam to visualize the uterus and ovaries.  COMPARISON:  CT 11/19/2013.  Ultrasound 04/11/2013.  FINDINGS: Uterus  Measurements: 8.1 x 4.1 x 5.9 cm. No fibroids or other mass visualized.  Endometrium  Thickness: 2.6 mm.  No focal abnormality visualized.  Right ovary  Measurements: 2.5 x 1.3 x 2.7 cm. Normal appearance/no adnexal mass.  Left ovary  Measurements: 1.6 x 1.1 x 1.5 cm. Normal appearance/no adnexal mass.  Other findings  No free fluid.  IMPRESSION: Negative exam.   Electronically Signed   By: Maisie Fushomas  Register   On: 12/25/2013 11:42    Assessment & Plan:  Normal pelvic ultrasound, no GYN etiology noted for her pain Patient reassured; will return for worsening symptoms. Routine preventative health maintenance measures emphasized.    Jaynie CollinsUGONNA  Niyati Heinke, MD, FACOG Attending Obstetrician & Gynecologist Center for [facility], [facility]

## 2013-12-31 NOTE — Patient Instructions (Signed)
Return to clinic for any scheduled appointments or for any gynecologic concerns as needed.   

## 2014-04-07 ENCOUNTER — Encounter: Payer: Self-pay | Admitting: Obstetrics & Gynecology

## 2014-04-22 ENCOUNTER — Ambulatory Visit: Payer: BC Managed Care – PPO | Admitting: Family Medicine

## 2014-04-23 ENCOUNTER — Ambulatory Visit (INDEPENDENT_AMBULATORY_CARE_PROVIDER_SITE_OTHER): Payer: BC Managed Care – PPO | Admitting: Family Medicine

## 2014-04-23 ENCOUNTER — Ambulatory Visit (HOSPITAL_COMMUNITY)
Admission: RE | Admit: 2014-04-23 | Discharge: 2014-04-23 | Disposition: A | Discharge status: Home / Self Care | Payer: BC Managed Care – PPO | Source: Ambulatory Visit | Attending: Family Medicine | Admitting: Family Medicine

## 2014-04-23 ENCOUNTER — Ambulatory Visit (INDEPENDENT_AMBULATORY_CARE_PROVIDER_SITE_OTHER): Payer: BC Managed Care – PPO

## 2014-04-23 ENCOUNTER — Encounter: Payer: Self-pay | Admitting: Family Medicine

## 2014-04-23 VITALS — BP 116/65 | HR 76 | Temp 98.1°F | Ht 64.0 in | Wt 140.9 lb

## 2014-04-23 DIAGNOSIS — Z0000 Encounter for general adult medical examination without abnormal findings: Secondary | ICD-10-CM

## 2014-04-23 DIAGNOSIS — F3160 Bipolar disorder, current episode mixed, unspecified: Secondary | ICD-10-CM | POA: Insufficient documentation

## 2014-04-23 DIAGNOSIS — G8929 Other chronic pain: Secondary | ICD-10-CM

## 2014-04-23 DIAGNOSIS — R1013 Epigastric pain: Secondary | ICD-10-CM

## 2014-04-23 DIAGNOSIS — K219 Gastro-esophageal reflux disease without esophagitis: Secondary | ICD-10-CM

## 2014-04-23 DIAGNOSIS — Z309 Encounter for contraceptive management, unspecified: Secondary | ICD-10-CM | POA: Diagnosis not present

## 2014-04-23 DIAGNOSIS — Z79899 Other long term (current) drug therapy: Secondary | ICD-10-CM | POA: Diagnosis not present

## 2014-04-23 DIAGNOSIS — F4323 Adjustment disorder with mixed anxiety and depressed mood: Secondary | ICD-10-CM

## 2014-04-23 DIAGNOSIS — Z3009 Encounter for other general counseling and advice on contraception: Secondary | ICD-10-CM

## 2014-04-23 DIAGNOSIS — Z23 Encounter for immunization: Secondary | ICD-10-CM | POA: Diagnosis not present

## 2014-04-23 LAB — LIPID PANEL
Cholesterol: 125 mg/dL (ref 0–200)
HDL: 45 mg/dL (ref >39)
LDL Cholesterol: 62 mg/dL (ref 0–99)
Total CHOL/HDL Ratio: 2.8 Ratio
Triglycerides: 89 mg/dL (ref <150)
VLDL: 18 mg/dL (ref 0–40)

## 2014-04-23 LAB — POCT GLYCOSYLATED HEMOGLOBIN (HGB A1C): Hemoglobin A1C: 5.4

## 2014-04-23 LAB — POCT URINE PREGNANCY: Preg Test, Ur: NEGATIVE

## 2014-04-23 MED ORDER — OMEPRAZOLE 20 MG PO CPDR: 20.0000 mg | DELAYED_RELEASE_CAPSULE | Freq: Every day | ORAL | Status: DC

## 2014-04-23 MED ORDER — OMEPRAZOLE 20 MG PO CPDR: 40.0000 mg | DELAYED_RELEASE_CAPSULE | Freq: Every day | ORAL | Status: DC

## 2014-04-23 NOTE — Assessment & Plan Note (Signed)
Patient prescribed Geodon, will perform EKG today and sent to psychiatrist. I have also sent prior flow sheets and prior EKGs to him as well.

## 2014-04-23 NOTE — Assessment & Plan Note (Signed)
Flu shot given today

## 2014-04-23 NOTE — Patient Instructions (Signed)
Insomnia Insomnia is frequent trouble falling and/or staying asleep. Insomnia can be a long term problem or a short term problem. Both are common. Insomnia can be a short term problem when the wakefulness is related to a certain stress or worry. Long term insomnia is often related to ongoing stress during waking hours and/or poor sleeping habits. Overtime, sleep deprivation itself can make the problem worse. Every little thing feels more severe because you are overtired and your ability to cope is decreased. CAUSES   Stress, anxiety, and depression.  Poor sleeping habits.  Distractions such as TV in the bedroom.  Naps close to bedtime.  Engaging in emotionally charged conversations before bed.  Technical reading before sleep.  Alcohol and other sedatives. They may make the problem worse. They can hurt normal sleep patterns and normal dream activity.  Stimulants such as caffeine for several hours prior to bedtime.  Pain syndromes and shortness of breath can cause insomnia.  Exercise late at night.  Changing time zones may cause sleeping problems (jet lag). It is sometimes helpful to have someone observe your sleeping patterns. They should look for periods of not breathing during the night (sleep apnea). They should also look to see how long those periods last. If you live alone or observers are uncertain, you can also be observed at a sleep clinic where your sleep patterns will be professionally monitored. Sleep apnea requires a checkup and treatment. Give your caregivers your medical history. Give your caregivers observations your family has made about your sleep.  SYMPTOMS   Not feeling rested in the morning.  Anxiety and restlessness at bedtime.  Difficulty falling and staying asleep. TREATMENT   Your caregiver may prescribe treatment for an underlying medical disorders. Your caregiver can give advice or help if you are using alcohol or other drugs for self-medication. Treatment  of underlying problems will usually eliminate insomnia problems.  Medications can be prescribed for short time use. They are generally not recommended for lengthy use.  Over-the-counter sleep medicines are not recommended for lengthy use. They can be habit forming.  You can promote easier sleeping by making lifestyle changes such as:  Using relaxation techniques that help with breathing and reduce muscle tension.  Exercising earlier in the day.  Changing your diet and the time of your last meal. No night time snacks.  Establish a regular time to go to bed.  Counseling can help with stressful problems and worry.  Soothing music and white noise may be helpful if there are background noises you cannot remove.  Stop tedious detailed work at least one hour before bedtime. HOME CARE INSTRUCTIONS   Keep a diary. Inform your caregiver about your progress. This includes any medication side effects. See your caregiver regularly. Take note of:  Times when you are asleep.  Times when you are awake during the night.  The quality of your sleep.  How you feel the next day. This information will help your caregiver care for you.  Get out of bed if you are still awake after 15 minutes. Read or do some quiet activity. Keep the lights down. Wait until you feel sleepy and go back to bed.  Keep regular sleeping and waking hours. Avoid naps.  Exercise regularly.  Avoid distractions at bedtime. Distractions include watching television or engaging in any intense or detailed activity like attempting to balance the household checkbook.  Develop a bedtime ritual. Keep a familiar routine of bathing, brushing your teeth, climbing into bed at the same   time each night, listening to soothing music. Routines increase the success of falling to sleep faster.  Use relaxation techniques. This can be using breathing and muscle tension release routines. It can also include visualizing peaceful scenes. You can  also help control troubling or intruding thoughts by keeping your mind occupied with boring or repetitive thoughts like the old concept of counting sheep. You can make it more creative like imagining planting one beautiful flower after another in your backyard garden.  During your day, work to eliminate stress. When this is not possible use some of the previous suggestions to help reduce the anxiety that accompanies stressful situations. MAKE SURE YOU:   Understand these instructions.  Will watch your condition.  Will get help right away if you are not doing well or get worse. Document Released: 05/20/2000 Document Revised: 08/15/2011 Document Reviewed: 06/20/2007 [facility] Patient Information 2015 [employer] [place]. This information is not intended to replace advice given to you by your health care provider. Make sure you discuss any questions you have with your health care provider.  It was a pleasure seeing you today. I will call you watch her lab work becomes available.  Depending upon results I will need to see you in 6 months to a year. I will call in 4 weeks of the lower dose of omeprazole, if you feel okay on this lower dose in 4 weeks we will try to take you off of it. You should just start taking a calcium and vitamin D supplementation, you can talk to your pharmacist about that and they give you your options.

## 2014-04-23 NOTE — Assessment & Plan Note (Signed)
Patient doing well on psychiatric meds and Ativan 0.5 mg at night for sleep. continue current regimen

## 2014-04-23 NOTE — Assessment & Plan Note (Signed)
Patient doing well on omeprazole and dietary changes. Explained omeprazole should not be used long-term, will taper down 20 mg dose for 4 weeks and then patient will have a trial off medications if she is able.

## 2014-04-23 NOTE — Progress Notes (Signed)
   Subjective:    Patient ID: Kelly Carr, female    DOB: June 06, 1971, 43 y.o.   MRN: [id number]  HPI  Birth control: Has missed a few BCP and has "messed up" her periods. She started her periods early d/t missed dosage. She has been taking them as prescribed the past two weeks. She has has unprotected sex in the meantime.   Bipolar disorder/antipsychotic medication use: patient states that she is stable with her bipolar disorder. She is following up with her psychiatrist regularly. Psychiatrist requesting EKG. Patient is a little upset with psychiatrist for consistently asking for copies of EKGs and focusing on one time she had tachycardia. She is in search for a new psychiatrist.  Anxiety/insomnia: Patient states she's still having high levels of anxiety. Her mother is doing better, but still needing a lot of care. Patient states that Ativan 0.5 mg before bedtime when necessary is working well for her. She states she does not need any refills at this time.  Past Medical History  Diagnosis Date  . Bipolar 1 disorder   . Abnormal Pap smear     Unknown results>colpo>normal  . Anxiety    No Known Allergies   Review of Systems Per HPI     Objective:   Physical Exam BP 116/65 mmHg  Pulse 76  Temp(Src) 98.1 F (36.7 C) (Oral)  Ht 5\' 4"  (1.626 m)  Wt 140 lb 14.4 oz (63.912 kg)  BMI 24.17 kg/m2  LMP 04/07/2014 Gen: very pleasant, Caucasian female, no acute distress, nontoxic appearance, well-developed, well-nourished. HEENT: AT. [place]. Bilateral TM visualized and normal in appearance. Bilateral eyes without injections or icterus. MMM. Bilateral nares without erythema, mild swelling. Throat without erythema or exudates.  CV: RRR murmurs Chest: CTAB, no wheeze or crackles Abd: Soft. flat. NTND. BS present. no Masses palpated.  Ext: No erythema. noedema.  Skin: no rashes, purpura or petechiae.  Neuro:Normal gait. PERLA. EOMi. Alert. Cranial nerves II through XII intact.  Psych:  normal affect, dress, demeanor and speech, normal mood    Assessment & Plan:

## 2014-04-23 NOTE — Assessment & Plan Note (Signed)
Patient missed a few birth control pills, will perform U pregnant today

## 2014-04-24 ENCOUNTER — Other Ambulatory Visit: Payer: Self-pay | Admitting: Family Medicine

## 2014-04-24 ENCOUNTER — Encounter: Payer: Self-pay | Admitting: Family Medicine

## 2014-04-24 ENCOUNTER — Telehealth: Payer: Self-pay | Admitting: Family Medicine

## 2014-04-24 MED ORDER — ATORVASTATIN CALCIUM 40 MG PO TABS: 40.0000 mg | ORAL_TABLET | Freq: Every day | ORAL | Status: DC

## 2014-04-24 MED ORDER — ATORVASTATIN CALCIUM 20 MG PO TABS: 20.0000 mg | ORAL_TABLET | Freq: Every day | ORAL | Status: DC

## 2014-04-24 NOTE — Telephone Encounter (Signed)
Please call the patient and inform her that her cholesterol is still elevated despite taking the Lipitor 20 mg daily. This is likely related to her medication, but could be related to her diet as well. Please ask her to continue following a diet high in vegetables and lean baked meats. I would also like to increase her Lipitor to 40 mg daily. I have called in a prescription to really flex the new changes. I will mail a copy of her lab work to her so that she can take it to her psychiatrist which is asking for copy. Thanks

## 2014-04-24 NOTE — Telephone Encounter (Signed)
Ignore prior phone message. Thanks.

## 2014-04-25 ENCOUNTER — Telehealth: Payer: Self-pay | Admitting: *Deleted

## 2014-04-25 ENCOUNTER — Telehealth: Payer: Self-pay | Admitting: Family Medicine

## 2014-04-25 NOTE — Telephone Encounter (Signed)
Pharmacy calls, needs to verify whether patient is to be taking Lipitor 40mg  or Liptior 20mg , will forward to PCP.

## 2014-04-25 NOTE — Telephone Encounter (Signed)
Please call pt pharmacy and inform them she is on 20 mg Lipitor. Thanks.

## 2014-05-28 ENCOUNTER — Encounter: Payer: Self-pay | Admitting: Family Medicine

## 2014-05-28 ENCOUNTER — Ambulatory Visit (INDEPENDENT_AMBULATORY_CARE_PROVIDER_SITE_OTHER): Payer: BC Managed Care – PPO | Admitting: Family Medicine

## 2014-05-28 VITALS — BP 106/60 | HR 80 | Temp 98.8°F | Wt 142.3 lb

## 2014-05-28 DIAGNOSIS — J029 Acute pharyngitis, unspecified: Secondary | ICD-10-CM

## 2014-05-28 MED ORDER — PENICILLIN V POTASSIUM 500 MG PO TABS: 500.0000 mg | ORAL_TABLET | Freq: Four times a day (QID) | ORAL | Status: DC

## 2014-05-28 NOTE — Progress Notes (Signed)
   Subjective:    Patient ID: Kelly Carr, female    DOB: April 03, 1971, 43 y.o.   MRN: [id number]  HPI Ill with resp infection for 5 days.  Not much fever.  Started with dry cough.  Now just sore throat and loss of voice.  Feels OK.  Did get flu shot this year.  No cough now.  Never had rhinorrhea   Review of Systems     Objective:   Physical Exam TMs normal Surprisingly, right tonsillar enlargement (both red) and possible early distortion of the soft palate arch. Small mildly tender high ant cervical triangle nodes bilaterally. Muffled voice Lungs clear.       Assessment & Plan:

## 2014-05-28 NOTE — Patient Instructions (Addendum)
I think this is most likely just a virus. I am a little worried about an early peritonsillar abscess on your right side.  That is why I am putting you on antibiotics.   If you get worse, you will need to go to the ER.  An Ear Nose and Throat doctor sometimes needs to drain an abscess if it develops. I sent a penicillin prescription to your pharmacy.

## 2014-05-28 NOTE — Assessment & Plan Note (Signed)
Likely viral.  Mild concern for developing acute right peritonsilar abscess.  Therefore will treat with antibiotics.  Strep screen would not alter my clinical jdgment.

## 2014-06-12 ENCOUNTER — Telehealth: Payer: Self-pay | Admitting: Family Medicine

## 2014-06-12 NOTE — Telephone Encounter (Signed)
Called and left [facility] to insure the ENT referral is necessary.  Ask her to call for SDA.

## 2014-06-12 NOTE — Telephone Encounter (Signed)
Pt called because she was told by Dr. Leveda AnnaHensel that it was possible she would have to see an ENT doctor after the holidays because of her issues on her throat. She said that the site on her throat has not gotten any better. jw

## 2014-06-12 NOTE — Telephone Encounter (Signed)
Please enter referral. Maree Erieeseree Blount, CMA

## 2014-06-16 ENCOUNTER — Ambulatory Visit: Payer: Self-pay | Admitting: Family Medicine

## 2014-06-26 ENCOUNTER — Ambulatory Visit: Payer: Self-pay | Admitting: Family Medicine

## 2014-08-11 ENCOUNTER — Encounter: Payer: Self-pay | Admitting: Family Medicine

## 2014-08-11 ENCOUNTER — Ambulatory Visit (INDEPENDENT_AMBULATORY_CARE_PROVIDER_SITE_OTHER): Payer: 59 | Admitting: Family Medicine

## 2014-08-11 VITALS — BP 129/79 | HR 93 | Temp 98.3°F | Ht 65.0 in | Wt 136.5 lb

## 2014-08-11 DIAGNOSIS — R21 Rash and other nonspecific skin eruption: Secondary | ICD-10-CM | POA: Insufficient documentation

## 2014-08-11 DIAGNOSIS — J312 Chronic pharyngitis: Secondary | ICD-10-CM

## 2014-08-11 DIAGNOSIS — J029 Acute pharyngitis, unspecified: Secondary | ICD-10-CM

## 2014-08-11 LAB — POCT RAPID STREP A (OFFICE): Rapid Strep A Screen: NEGATIVE

## 2014-08-11 MED ORDER — CLOTRIMAZOLE 1 % EX CREA: 1.0000 "application " | TOPICAL_CREAM | Freq: Two times a day (BID) | CUTANEOUS | Status: DC

## 2014-08-11 NOTE — Patient Instructions (Signed)
Your strep was negative today. I have placed a ENT referral for you, they will be calling you to make an appointment. Your rash is likely Yeast, I have called in clotrimazole cream for you to applied 2 times a day and follow-up with me in 2 weeks if it hasn't resolved.

## 2014-08-11 NOTE — Progress Notes (Signed)
   Subjective:    Patient ID: Kelly Carr, female    DOB: 1970/07/10, 44 y.o.   MRN: [id number]  HPI  Sore throat: Pt presents to follow-up for sore throat to the family medicine clinic. Patient was seen around the holidays in December with complaints of sore throat. At that time it was thought to be possibly from a viral infection versus strep, and she was given an antibiotic course. She reports possibly mild improvement with antibiotics, but never completely resolved. She has an appetite, but it is uncomfortable to swallow her solid foods. She denies any cough, fever, rhinorrhea, loss of voice or foul taste in her mouth/throat. She states she does not feel like the right side of her throat has gotten larger, but she feels that the left side is now becoming affected. She has lost 6 pounds since her last visit in December (intentionally). She is a former smoker.  Rash: Patient has a 2 day history of small oval approximately 3 cm scaly rash under her right breast. She denies discomfort and it is not pruritic. She has tried nothing to make this better.  Past Medical History  Diagnosis Date  . Bipolar 1 disorder   . Abnormal Pap smear     Unknown results>colpo>normal  . Anxiety    Allergies  Allergen Reactions  . Haldol [Haloperidol Lactate] Other (See Comments)    Syncope     Review of Systems Per HPI    Objective:   Physical Exam BP 129/79 mmHg  Pulse 93  Temp(Src) 98.3 F (36.8 C) (Oral)  Ht 5\' 5"  (1.651 m)  Wt 136 lb 8 oz (61.916 kg)  BMI 22.71 kg/m2  LMP 07/31/2014 Gen: NAD. Very pleasant, nontoxic well-developed, well-nourished. HEENT: AT. [place]. Bilateral TM visualized and normal in appearance. Bilateral eyes without injections or icterus. MMM. Bilateral nares without erythema or swelling. Throat with erythemal right greater than left tonsil enlargement, no exudates.  CV: RRR Chest: CTAB, no wheeze or crackles Skin: Maximally 3 cm oval scaly erythemic rash, under a  breast      Assessment & Plan:

## 2014-08-11 NOTE — Assessment & Plan Note (Signed)
Appears to be small section of yeast irritation. Will attempt clotrimazole cream and follow-up in 2 weeks if no improvement.

## 2014-08-11 NOTE — Assessment & Plan Note (Signed)
Patient with now 3 month history of tonsillar enlargement right greater than left. Trial of antibiotics did not resolve issue. Repeated strep culture today, although patient does not appear to be acutely ill with systemic symptoms. Will treat if positive only with Augmentin. ENT referral Follow-up as needed

## 2014-08-13 ENCOUNTER — Telehealth: Payer: Self-pay | Admitting: Family Medicine

## 2014-08-13 NOTE — Telephone Encounter (Signed)
Pt calling because she has not received a phone call for the ENT appt and was wondering when to expect that call / sr

## 2014-08-13 NOTE — Telephone Encounter (Signed)
Referral has been sent to [facility] patient will receive call when appointment scheduled

## 2014-08-15 ENCOUNTER — Telehealth: Payer: Self-pay | Admitting: Family Medicine

## 2014-08-15 NOTE — Telephone Encounter (Signed)
[facility] called and need a compass referral for the patient. She has an appointment on 08/19/14 at 1 pm and will be seeing Dr. Emeline DarlingGore.

## 2014-08-19 NOTE — Telephone Encounter (Signed)
[facility]  [id number]

## 2014-08-20 ENCOUNTER — Other Ambulatory Visit (HOSPITAL_COMMUNITY): Payer: Self-pay | Admitting: *Deleted

## 2014-08-20 ENCOUNTER — Other Ambulatory Visit (HOSPITAL_COMMUNITY): Payer: Self-pay | Admitting: Otolaryngology

## 2014-08-20 DIAGNOSIS — R1310 Dysphagia, unspecified: Secondary | ICD-10-CM

## 2014-08-20 DIAGNOSIS — R1314 Dysphagia, pharyngoesophageal phase: Secondary | ICD-10-CM

## 2014-08-26 ENCOUNTER — Ambulatory Visit (HOSPITAL_COMMUNITY): Admission: RE | Admit: 2014-08-26 | Payer: 59 | Source: Ambulatory Visit

## 2014-08-26 ENCOUNTER — Ambulatory Visit (HOSPITAL_COMMUNITY): Payer: 59

## 2014-11-28 ENCOUNTER — Ambulatory Visit: Payer: 59 | Admitting: Family Medicine

## 2014-12-24 ENCOUNTER — Encounter: Payer: Self-pay | Admitting: Family Medicine

## 2014-12-24 ENCOUNTER — Ambulatory Visit (INDEPENDENT_AMBULATORY_CARE_PROVIDER_SITE_OTHER): Payer: 59 | Admitting: Family Medicine

## 2014-12-24 VITALS — BP 111/71 | HR 88 | Temp 98.7°F | Ht 64.0 in | Wt 139.4 lb

## 2014-12-24 DIAGNOSIS — M25551 Pain in right hip: Secondary | ICD-10-CM | POA: Diagnosis not present

## 2014-12-24 DIAGNOSIS — Z3041 Encounter for surveillance of contraceptive pills: Secondary | ICD-10-CM | POA: Diagnosis not present

## 2014-12-24 DIAGNOSIS — R079 Chest pain, unspecified: Secondary | ICD-10-CM

## 2014-12-24 MED ORDER — NORGESTIMATE-ETH ESTRADIOL 0.25-35 MG-MCG PO TABS: 1.0000 | ORAL_TABLET | Freq: Every day | ORAL | Status: DC

## 2014-12-24 MED ORDER — MELOXICAM 7.5 MG PO TABS: 7.5000 mg | ORAL_TABLET | Freq: Every day | ORAL | Status: DC

## 2014-12-24 NOTE — Patient Instructions (Signed)
Thank you for coming in,   Please try to the anti-inflammatory for three weeks and the home exercises.   If there is no improvement then follow up with me.   Please bring all of your medications with you to each visit.    Please feel free to call with any questions or concerns at any time, at [phone]. --Dr. Jordan LikesSchmitz

## 2014-12-24 NOTE — Progress Notes (Addendum)
Subjective:    HPI  Kelly Carr is here for right leg pain and .  Location: Right buttock and radiates to her down her anterior thigh  Pain started: three weeks  Pain is: sharp, shooting pain  Severity: 10/10 yesterday. Intermittent in nature.  Medications tried: ibuprofen  Recent trauma: no Similar pain previously: no  Symptoms Redness:no Swelling:no Fever: no Weakness: no Weight loss: no Rash: no  CHEST PAIN  Chest pain began last week. 4 times in the past month. Not related to exercise  Pain is: sharp, shooting  How severe is the pain: 8/10  What worsens the pain: same every time. Unsure of trigger. A lot of issues with her mother, early dementia.  What makes the pain better: no Radiation: no  PMH History of blood clot or heart problems or aneurysms: no  Symptoms Nausea/vomiting: no Shortness of breath: yes Pleuritic pain: no Cough: no Swelling of legs: no Syncope: no Heart burn or food sticking: no Immobility: no  Review of Symptoms - see HPI PMH - Smoking status noted.    Health Maintenance:  Health Maintenance Due  Topic Date Due  . HIV Screening  03/19/1988    -  reports that she has quit smoking. Her smoking use included Cigarettes. She has never used smokeless tobacco. - Review of Systems: Per HPI. All other systems reviewed and are negative. - Past Medical History: Patient Active Problem List   Diagnosis Date Noted  . Rash and nonspecific skin eruption 08/11/2014  . Pharyngitis, chronic 08/11/2014  . Acute pharyngitis 05/28/2014  . Encounter for long-term (current) use of medications 04/23/2014  . Adjustment disorder with mixed anxiety and depressed mood 10/17/2013  . Abdominal pain, epigastric 07/17/2013  . Irregular menses 07/17/2013  . Health care maintenance 04/04/2013  . General counseling for prescription of oral contraceptives 12/29/2011  . GERD (gastroesophageal reflux disease) 11/10/2010  . Carpal tunnel syndrome of left  wrist 11/10/2010  . Bipolar disorder 11/25/2008  . OTH ABNORMAL PAPANICOLAOU SMEAR CERVIX&CERV HPV 11/25/2008  . DEPRESSION 10/21/2008   - Medications: reviewed and updated Current Outpatient Prescriptions  Medication Sig Dispense Refill  . atorvastatin (LIPITOR) 20 MG tablet Take 1 tablet (20 mg total) by mouth daily. 30 tablet 6  . benztropine (COGENTIN) 0.5 MG tablet Take 0.5 mg by mouth 2 (two) times daily.    Marland Kitchen buPROPion (WELLBUTRIN XL) 150 MG 24 hr tablet Take 150 mg by mouth daily.    . clotrimazole (LOTRIMIN) 1 % cream Apply 1 application topically 2 (two) times daily. 30 g 0  . LORazepam (ATIVAN) 0.5 MG tablet     . Melatonin 5 MG TABS Take 1 tablet by mouth at bedtime as needed. For sleep    . naproxen (NAPROSYN) 500 MG tablet Take 1 tablet (500 mg total) by mouth 2 (two) times daily with a meal. As needed for pain 60 tablet 2  . norgestimate-ethinyl estradiol (ORTHO-CYCLEN,SPRINTEC,PREVIFEM) 0.25-35 MG-MCG tablet Take 1 tablet by mouth daily. 1 Package 11  . omeprazole (PRILOSEC) 20 MG capsule Take 1 capsule (20 mg total) by mouth daily. 30 capsule 0  . penicillin v potassium (VEETID) 500 MG tablet Take 1 tablet (500 mg total) by mouth 4 (four) times daily. 40 tablet 0  . ziprasidone (GEODON) 80 MG capsule Take 80 mg by mouth 2 (two) times daily with a meal.     No current facility-administered medications for this visit.     Review of Systems See HPI  Objective:   Physical Exam BP 111/71 mmHg  Pulse 88  Temp(Src) 98.7 F (37.1 C) (Oral)  Ht 5\' 4"  (1.626 m)  Wt 139 lb 6.4 oz (63.231 kg)  BMI 23.92 kg/m2  LMP 12/10/2014 Gen: NAD, alert, cooperative with exam, well-appearing CV: RRR, good S1/S2, no murmur, no edema, capillary refill brisk  Resp: CTABL, no wheezes, non-labored Neuro: no gross deficits.  Psych:  alert and oriented MSK: no chest pain to palpation  Right HIP: no ecchymosis, TTP over right gluteal medius No TTP over greater trochanteric, IT band    Normal internal and external rotation  Normal hip flexion and extension  Normal sensation and 5/5 strength  Weak hip resisted abduction b/l  Neurovascularly intact  Neg FABER  Tight with pelvic rocking  Normal gait  Back: no TTP along lumbar spinal  Neg straight leg test b/l       Assessment & Plan:

## 2014-12-27 DIAGNOSIS — S73199A Other sprain of unspecified hip, initial encounter: Secondary | ICD-10-CM | POA: Insufficient documentation

## 2014-12-27 DIAGNOSIS — R079 Chest pain, unspecified: Secondary | ICD-10-CM | POA: Insufficient documentation

## 2014-12-27 HISTORY — DX: Other sprain of unspecified hip, initial encounter: S73.199A

## 2014-12-27 NOTE — Assessment & Plan Note (Signed)
Suspect that pain is 2/2 weak glute medius. She is tight in her pelvis so some SI joint could be contributing but no lumbago.  - mobic  - given home modalities  - encourage appropriate footwear - if no improvement may need trial of PT or capsacian cream

## 2014-12-27 NOTE — Assessment & Plan Note (Signed)
Most likely related to her anxiety vs stress. Mother diagnosed with dementia so taxing on her as caregiver. Not suspicious of cardiac in origin  - relaxation techniques  - may need GAD-7 or PHQ-9 at f/u  - could start PPI if persists.

## 2015-01-07 ENCOUNTER — Ambulatory Visit: Payer: 59 | Admitting: Family Medicine

## 2015-02-18 ENCOUNTER — Ambulatory Visit (INDEPENDENT_AMBULATORY_CARE_PROVIDER_SITE_OTHER): Payer: 59 | Admitting: Obstetrics and Gynecology

## 2015-02-18 ENCOUNTER — Encounter: Payer: Self-pay | Admitting: Obstetrics and Gynecology

## 2015-02-18 VITALS — BP 111/70 | HR 80 | Wt 139.0 lb

## 2015-02-18 DIAGNOSIS — N760 Acute vaginitis: Secondary | ICD-10-CM | POA: Diagnosis not present

## 2015-02-18 MED ORDER — NORGESTIMATE-ETH ESTRADIOL 0.25-35 MG-MCG PO TABS: 1.0000 | ORAL_TABLET | Freq: Every day | ORAL | Status: DC

## 2015-02-18 NOTE — Progress Notes (Signed)
Patient ID: Kelly Carr, female   DOB: 03-03-1971, 44 y.o.   MRN: [id number] 44 yo here for the evaluation of vaginitis. Patient reports vaginal pruritis for the past 3 days not associated with any abnormal discharge. She denies any change is laundry detergent, pads/panty liners/tampons. Patient denies any new sexual partner. She denies any abdominal pain. She would like to be restarted on her OCP.  Past Medical History  Diagnosis Date  . Bipolar 1 disorder   . Abnormal Pap smear     Unknown results>colpo>normal  . Anxiety    History reviewed. No pertinent past surgical history. Family History  Problem Relation Age of Onset  . Diabetes Mother   . Hypertension Mother   . Diabetes Maternal Grandmother   . Heart disease Maternal Grandmother   . Diabetes Maternal Grandfather   . Heart disease Maternal Grandfather    Social History  Substance Use Topics  . Smoking status: Former Smoker    Types: Cigarettes  . Smokeless tobacco: Never Used  . Alcohol Use: No   GENERAL: Well-developed, well-nourished female in no acute distress.  ABDOMEN: Soft, nontender, nondistended. No organomegaly. PELVIC: Normal external female genitalia. Vagina is pink and rugated.  Normal discharge. Normal appearing cervix. Uterus is normal in size. No adnexal mass or tenderness. EXTREMITIES: No cyanosis, clubbing, or edema, 2+ distal pulses.  A/P 44 yo here with vaginitis - Wet prep collected - Rx Sprintec provided per patient request - Patient will be contacted with any abnormal results

## 2015-02-19 ENCOUNTER — Telehealth: Payer: Self-pay | Admitting: *Deleted

## 2015-02-19 LAB — WET PREP, GENITAL
Trich, Wet Prep: NONE SEEN
Yeast Wet Prep HPF POC: NONE SEEN

## 2015-02-19 MED ORDER — METRONIDAZOLE 500 MG PO TABS: 500.0000 mg | ORAL_TABLET | Freq: Two times a day (BID) | ORAL | Status: DC

## 2015-02-19 NOTE — Addendum Note (Signed)
Addended by: Catalina Antigua on: 02/19/2015 08:57 AM   Modules accepted: Orders

## 2015-02-19 NOTE — Telephone Encounter (Signed)
Called pt, no answer, left message to call office.

## 2015-02-19 NOTE — Telephone Encounter (Signed)
Informed pt about results of BV and need for treatment with Flagyl.  Instructed pt on use and to call office back if she continues to have any problems. Pt acknowledged instructions.

## 2015-02-19 NOTE — Telephone Encounter (Signed)
-----   Message from Catalina Antigua, MD sent at 02/19/2015  8:57 AM EDT ----- Please inform patient of positive BV. Flagyl e-prescribed  Thanks  [employer]

## 2015-02-20 ENCOUNTER — Telehealth: Payer: Self-pay | Admitting: *Deleted

## 2015-02-20 ENCOUNTER — Ambulatory Visit: Payer: 59 | Admitting: Internal Medicine

## 2015-02-20 DIAGNOSIS — N760 Acute vaginitis: Principal | ICD-10-CM

## 2015-02-20 DIAGNOSIS — B9689 Other specified bacterial agents as the cause of diseases classified elsewhere: Secondary | ICD-10-CM

## 2015-02-20 MED ORDER — METRONIDAZOLE 500 MG PO TABS: 500.0000 mg | ORAL_TABLET | Freq: Two times a day (BID) | ORAL | Status: DC

## 2015-02-20 NOTE — Telephone Encounter (Signed)
Patient called and said the pharmacy never received Flagyl prescription.  I have resent another prescription to pharmacy.

## 2015-02-23 ENCOUNTER — Ambulatory Visit: Payer: 59 | Admitting: Obstetrics & Gynecology

## 2015-02-23 DIAGNOSIS — N926 Irregular menstruation, unspecified: Secondary | ICD-10-CM

## 2015-02-27 ENCOUNTER — Ambulatory Visit (INDEPENDENT_AMBULATORY_CARE_PROVIDER_SITE_OTHER): Payer: 59 | Admitting: Obstetrics & Gynecology

## 2015-02-27 ENCOUNTER — Encounter: Payer: Self-pay | Admitting: Obstetrics & Gynecology

## 2015-02-27 VITALS — BP 119/75 | HR 85 | Ht 64.0 in | Wt 141.0 lb

## 2015-02-27 DIAGNOSIS — Z1151 Encounter for screening for human papillomavirus (HPV): Secondary | ICD-10-CM | POA: Diagnosis not present

## 2015-02-27 DIAGNOSIS — Z01419 Encounter for gynecological examination (general) (routine) without abnormal findings: Secondary | ICD-10-CM

## 2015-02-27 DIAGNOSIS — Z1239 Encounter for other screening for malignant neoplasm of breast: Secondary | ICD-10-CM

## 2015-02-27 DIAGNOSIS — Z124 Encounter for screening for malignant neoplasm of cervix: Secondary | ICD-10-CM

## 2015-02-27 NOTE — Patient Instructions (Signed)
Thank you for enrolling in [facility]. Please follow the instructions below to securely access your online medical record. MyChart allows you to send messages to your doctor, view your test results, manage appointments, and more.   How Do I Sign Up? 1. In your Internet browser, go to [employer] and enter [email or web address]. 2. Click on the Sign Up Now link in the Sign In box. You will see the New Member Sign Up page. 3. Enter your MyChart Access Code exactly as it appears below. You will not need to use this code after you've completed the sign-up process. If you do not sign up before the expiration date, you must request a new code.  MyChart Access Code: [email or web address] Expires: 04/14/2015  3:09 AM  4. Enter your Social Security Number ([email or web address]) and Date of Birth (mm/dd/yyyy) as indicated and click Submit. You will be taken to the next sign-up page. 5. Create a MyChart ID. This will be your MyChart login ID and cannot be changed, so think of one that is secure and easy to remember. 6. Create a MyChart password. You can change your password at any time. 7. Enter your Password Reset Question and Answer. This can be used at a later time if you forget your password.  8. Enter your e-mail address. You will receive e-mail notification when new information is available in [facility]. 9. Click Sign Up. You can now view your medical record.   Additional Information Remember, MyChart is NOT to be used for urgent needs. For medical emergencies, dial 911.      Preventive Care for Adults A healthy lifestyle and preventive care can promote health and wellness. Preventive health guidelines for women include the following key practices.  A routine yearly physical is a good way to check with your health care provider about your health and preventive screening. It is a chance to share any concerns and updates on your health and to receive a thorough exam.  Visit your dentist for a routine  exam and preventive care every 6 months. Brush your teeth twice a day and floss once a day. Good oral hygiene prevents tooth decay and gum disease.  The frequency of eye exams is based on your age, health, family medical history, use of contact lenses, and other factors. Follow your health care provider's recommendations for frequency of eye exams.  Eat a healthy diet. Foods like vegetables, fruits, whole grains, low-fat dairy products, and lean protein foods contain the nutrients you need without too many calories. Decrease your intake of foods high in solid fats, added sugars, and salt. Eat the right amount of calories for you.Get information about a proper diet from your health care provider, if necessary.  Regular physical exercise is one of the most important things you can do for your health. Most adults should get at least 150 minutes of moderate-intensity exercise (any activity that increases your heart rate and causes you to sweat) each week. In addition, most adults need muscle-strengthening exercises on 2 or more days a week.  Maintain a healthy weight. The body mass index (BMI) is a screening tool to identify possible weight problems. It provides an estimate of body fat based on height and weight. Your health care provider can find your BMI and can help you achieve or maintain a healthy weight.For adults 20 years and older:  A BMI below 18.5 is considered underweight.  A BMI of 18.5 to 24.9 is normal.  A BMI of 25 to 29.9 is considered overweight.  A BMI of 30 and above is considered obese.  Maintain normal blood lipids and cholesterol levels by exercising and minimizing your intake of saturated fat. Eat a balanced diet with plenty of fruit and vegetables. Blood tests for lipids and cholesterol should begin at age 40 and be repeated every 5 years. If your lipid or cholesterol levels are high, you are over 50, or you are at high risk for heart disease, you may need your cholesterol  levels checked more frequently.Ongoing high lipid and cholesterol levels should be treated with medicines if diet and exercise are not working.  If you smoke, find out from your health care provider how to quit. If you do not use tobacco, do not start.  Lung cancer screening is recommended for adults aged 85-80 years who are at high risk for developing lung cancer because of a history of smoking. A yearly low-dose CT scan of the lungs is recommended for people who have at least a 30-pack-year history of smoking and are a current smoker or have quit within the past 15 years. A pack year of smoking is smoking an average of 1 pack of cigarettes a day for 1 year (for example: 1 pack a day for 30 years or 2 packs a day for 15 years). Yearly screening should continue until the smoker has stopped smoking for at least 15 years. Yearly screening should be stopped for people who develop a health problem that would prevent them from having lung cancer treatment.  If you are pregnant, do not drink alcohol. If you are breastfeeding, be very cautious about drinking alcohol. If you are not pregnant and choose to drink alcohol, do not have more than 1 drink per day. One drink is considered to be 12 ounces (355 mL) of beer, 5 ounces (148 mL) of wine, or 1.5 ounces (44 mL) of liquor.  Avoid use of street drugs. Do not share needles with anyone. Ask for help if you need support or instructions about stopping the use of drugs.  High blood pressure causes heart disease and increases the risk of stroke. Your blood pressure should be checked at least every 1 to 2 years. Ongoing high blood pressure should be treated with medicines if weight loss and exercise do not work.  If you are 59-15 years old, ask your health care provider if you should take aspirin to prevent strokes.  Diabetes screening involves taking a blood sample to check your fasting blood sugar level. This should be done once every 3 years, after age 107, if you  are within normal weight and without risk factors for diabetes. Testing should be considered at a younger age or be carried out more frequently if you are overweight and have at least 1 risk factor for diabetes.  Breast cancer screening is essential preventive care for women. You should practice "breast self-awareness." This means understanding the normal appearance and feel of your breasts and may include breast self-examination. Any changes detected, no matter how small, should be reported to a health care provider. Women in their 57s and 30s should have a clinical breast exam (CBE) by a health care provider as part of a regular health exam every 1 to 3 years. After age 98, women should have a CBE every year. Starting at age 107, women should consider having a mammogram (breast X-ray test) every year. Women who have a family history of breast cancer should talk to their health care provider about genetic screening. Women at a high risk  of breast cancer should talk to their health care providers about having an MRI and a mammogram every year.  Breast cancer gene (BRCA)-related cancer risk assessment is recommended for women who have family members with BRCA-related cancers. BRCA-related cancers include breast, ovarian, tubal, and peritoneal cancers. Having family members with these cancers may be associated with an increased risk for harmful changes (mutations) in the breast cancer genes BRCA1 and BRCA2. Results of the assessment will determine the need for genetic counseling and BRCA1 and BRCA2 testing.  Routine pelvic exams to screen for cancer are no longer recommended for nonpregnant women who are considered low risk for cancer of the pelvic organs (ovaries, uterus, and vagina) and who do not have symptoms. Ask your health care provider if a screening pelvic exam is right for you.  If you have had past treatment for cervical cancer or a condition that could lead to cancer, you need Pap tests and  screening for cancer for at least 20 years after your treatment. If Pap tests have been discontinued, your risk factors (such as having a new sexual partner) need to be reassessed to determine if screening should be resumed. Some women have medical problems that increase the chance of getting cervical cancer. In these cases, your health care provider may recommend more frequent screening and Pap tests.  The HPV test is an additional test that may be used for cervical cancer screening. The HPV test looks for the virus that can cause the cell changes on the cervix. The cells collected during the Pap test can be tested for HPV. The HPV test could be used to screen women aged 44 years and older, and should be used in women of any age who have unclear Pap test results. After the age of 58, women should have HPV testing at the same frequency as a Pap test.  Colorectal cancer can be detected and often prevented. Most routine colorectal cancer screening begins at the age of 94 years and continues through age 64 years. However, your health care provider may recommend screening at an earlier age if you have risk factors for colon cancer. On a yearly basis, your health care provider may provide home test kits to check for hidden blood in the stool. Use of a small camera at the end of a tube, to directly examine the colon (sigmoidoscopy or colonoscopy), can detect the earliest forms of colorectal cancer. Talk to your health care provider about this at age 74, when routine screening begins. Direct exam of the colon should be repeated every 5-10 years through age 26 years, unless early forms of pre-cancerous polyps or small growths are found.  People who are at an increased risk for hepatitis B should be screened for this virus. You are considered at high risk for hepatitis B if:  You were born in a country where hepatitis B occurs often. Talk with your health care provider about which countries are considered high  risk.  Your parents were born in a high-risk country and you have not received a shot to protect against hepatitis B (hepatitis B vaccine).  You have HIV or AIDS.  You use needles to inject street drugs.  You live with, or have sex with, someone who has hepatitis B.  You get hemodialysis treatment.  You take certain medicines for conditions like cancer, organ transplantation, and autoimmune conditions.  Hepatitis C blood testing is recommended for all people born from [birth year] through 1963 and any individual with known risks for  hepatitis C.  Practice safe sex. Use condoms and avoid high-risk sexual practices to reduce the spread of sexually transmitted infections (STIs). STIs include gonorrhea, chlamydia, syphilis, trichomonas, herpes, HPV, and human immunodeficiency virus (HIV). Herpes, HIV, and HPV are viral illnesses that have no cure. They can result in disability, cancer, and death.  You should be screened for sexually transmitted illnesses (STIs) including gonorrhea and chlamydia if:  You are sexually active and are younger than 24 years.  You are older than 24 years and your health care provider tells you that you are at risk for this type of infection.  Your sexual activity has changed since you were last screened and you are at an increased risk for chlamydia or gonorrhea. Ask your health care provider if you are at risk.  If you are at risk of being infected with HIV, it is recommended that you take a prescription medicine daily to prevent HIV infection. This is called preexposure prophylaxis (PrEP). You are considered at risk if:  You are a heterosexual woman, are sexually active, and are at increased risk for HIV infection.  You take drugs by injection.  You are sexually active with a partner who has HIV.  Talk with your health care provider about whether you are at high risk of being infected with HIV. If you choose to begin PrEP, you should first be tested for HIV. You  should then be tested every 3 months for as long as you are taking PrEP.  Osteoporosis is a disease in which the bones lose minerals and strength with aging. This can result in serious bone fractures or breaks. The risk of osteoporosis can be identified using a bone density scan. Women ages 4 years and over and women at risk for fractures or osteoporosis should discuss screening with their health care providers. Ask your health care provider whether you should take a calcium supplement or vitamin D to reduce the rate of osteoporosis.  Menopause can be associated with physical symptoms and risks. Hormone replacement therapy is available to decrease symptoms and risks. You should talk to your health care provider about whether hormone replacement therapy is right for you.  Use sunscreen. Apply sunscreen liberally and repeatedly throughout the day. You should seek shade when your shadow is shorter than you. Protect yourself by wearing long sleeves, pants, a wide-brimmed hat, and sunglasses year round, whenever you are outdoors.  Once a month, do a whole body skin exam, using a mirror to look at the skin on your back. Tell your health care provider of new moles, moles that have irregular borders, moles that are larger than a pencil eraser, or moles that have changed in shape or color.  Stay current with required vaccines (immunizations).  Influenza vaccine. All adults should be immunized every year.  Tetanus, diphtheria, and acellular pertussis (Td, Tdap) vaccine. Pregnant women should receive 1 dose of Tdap vaccine during each pregnancy. The dose should be obtained regardless of the length of time since the last dose. Immunization is preferred during the 27th-36th week of gestation. An adult who has not previously received Tdap or who does not know her vaccine status should receive 1 dose of Tdap. This initial dose should be followed by tetanus and diphtheria toxoids (Td) booster doses every 10 years.  Adults with an unknown or incomplete history of completing a 3-dose immunization series with Td-containing vaccines should begin or complete a primary immunization series including a Tdap dose. Adults should receive a Td booster every  10 years.  Varicella vaccine. An adult without evidence of immunity to varicella should receive 2 doses or a second dose if she has previously received 1 dose. Pregnant females who do not have evidence of immunity should receive the first dose after pregnancy. This first dose should be obtained before leaving the health care facility. The second dose should be obtained 4-8 weeks after the first dose.  Human papillomavirus (HPV) vaccine. Females aged 13-26 years who have not received the vaccine previously should obtain the 3-dose series. The vaccine is not recommended for use in pregnant females. However, pregnancy testing is not needed before receiving a dose. If a female is found to be pregnant after receiving a dose, no treatment is needed. In that case, the remaining doses should be delayed until after the pregnancy. Immunization is recommended for any person with an immunocompromised condition through the age of 30 years if she did not get any or all doses earlier. During the 3-dose series, the second dose should be obtained 4-8 weeks after the first dose. The third dose should be obtained 24 weeks after the first dose and 16 weeks after the second dose.  Zoster vaccine. One dose is recommended for adults aged 59 years or older unless certain conditions are present.  Measles, mumps, and rubella (MMR) vaccine. Adults born before [birth year] generally are considered immune to measles and mumps. Adults born in [birth year] or later should have 1 or more doses of MMR vaccine unless there is a contraindication to the vaccine or there is laboratory evidence of immunity to each of the three diseases. A routine second dose of MMR vaccine should be obtained at least 28 days after the first dose  for students attending postsecondary schools, health care workers, or international travelers. People who received inactivated measles vaccine or an unknown type of measles vaccine during 1963-1967 should receive 2 doses of MMR vaccine. People who received inactivated mumps vaccine or an unknown type of mumps vaccine before 1979 and are at high risk for mumps infection should consider immunization with 2 doses of MMR vaccine. For females of childbearing age, rubella immunity should be determined. If there is no evidence of immunity, females who are not pregnant should be vaccinated. If there is no evidence of immunity, females who are pregnant should delay immunization until after pregnancy. Unvaccinated health care workers born before [birth year] who lack laboratory evidence of measles, mumps, or rubella immunity or laboratory confirmation of disease should consider measles and mumps immunization with 2 doses of MMR vaccine or rubella immunization with 1 dose of MMR vaccine.  Pneumococcal 13-valent conjugate (PCV13) vaccine. When indicated, a person who is uncertain of her immunization history and has no record of immunization should receive the PCV13 vaccine. An adult aged 34 years or older who has certain medical conditions and has not been previously immunized should receive 1 dose of PCV13 vaccine. This PCV13 should be followed with a dose of pneumococcal polysaccharide (PPSV23) vaccine. The PPSV23 vaccine dose should be obtained at least 8 weeks after the dose of PCV13 vaccine. An adult aged 54 years or older who has certain medical conditions and previously received 1 or more doses of PPSV23 vaccine should receive 1 dose of PCV13. The PCV13 vaccine dose should be obtained 1 or more years after the last PPSV23 vaccine dose.  Pneumococcal polysaccharide (PPSV23) vaccine. When PCV13 is also indicated, PCV13 should be obtained first. All adults aged 74 years and older should be immunized. An adult younger than age  65 years who has certain medical conditions should be immunized. Any person who resides in a nursing home or long-term care facility should be immunized. An adult smoker should be immunized. People with an immunocompromised condition and certain other conditions should receive both PCV13 and PPSV23 vaccines. People with human immunodeficiency virus (HIV) infection should be immunized as soon as possible after diagnosis. Immunization during chemotherapy or radiation therapy should be avoided. Routine use of PPSV23 vaccine is not recommended for American Indians, [place] Natives, or people younger than 65 years unless there are medical conditions that require PPSV23 vaccine. When indicated, people who have unknown immunization and have no record of immunization should receive PPSV23 vaccine. One-time revaccination 5 years after the first dose of PPSV23 is recommended for people aged 19-64 years who have chronic kidney failure, nephrotic syndrome, asplenia, or immunocompromised conditions. People who received 1-2 doses of PPSV23 before age 2 years should receive another dose of PPSV23 vaccine at age 63 years or later if at least 5 years have passed since the previous dose. Doses of PPSV23 are not needed for people immunized with PPSV23 at or after age 65 years.  Meningococcal vaccine. Adults with asplenia or persistent complement component deficiencies should receive 2 doses of quadrivalent meningococcal conjugate (MenACWY-D) vaccine. The doses should be obtained at least 2 months apart. Microbiologists working with certain meningococcal bacteria, [place] recruits, people at risk during an outbreak, and people who travel to or live in countries with a high rate of meningitis should be immunized. A first-year college student up through age 35 years who is living in a residence hall should receive a dose if she did not receive a dose on or after her 16th birthday. Adults who have certain high-risk conditions should  receive one or more doses of vaccine.  Hepatitis A vaccine. Adults who wish to be protected from this disease, have certain high-risk conditions, work with hepatitis A-infected animals, work in hepatitis A research labs, or travel to or work in countries with a high rate of hepatitis A should be immunized. Adults who were previously unvaccinated and who anticipate close contact with an international adoptee during the first 60 days after arrival in the [place] from a country with a high rate of hepatitis A should be immunized.  Hepatitis B vaccine. Adults who wish to be protected from this disease, have certain high-risk conditions, may be exposed to blood or other infectious body fluids, are household contacts or sex partners of hepatitis B positive people, are clients or workers in certain care facilities, or travel to or work in countries with a high rate of hepatitis B should be immunized.  Haemophilus influenzae type b (Hib) vaccine. A previously unvaccinated person with asplenia or sickle cell disease or having a scheduled splenectomy should receive 1 dose of Hib vaccine. Regardless of previous immunization, a recipient of a hematopoietic stem cell transplant should receive a 3-dose series 6-12 months after her successful transplant. Hib vaccine is not recommended for adults with HIV infection. Preventive Services / Frequency Ages 70 to 8 years  Blood pressure check.** / Every 1 to 2 years.  Lipid and cholesterol check.** / Every 5 years beginning at age 1.  Clinical breast exam.** / Every 3 years for women in their 71s and 11s.  BRCA-related cancer risk assessment.** / For women who have family members with a BRCA-related cancer (breast, ovarian, tubal, or peritoneal cancers).  Pap test.** / Every 2 years from ages 48 through 88. Every 3 years  starting at age 64 through age 3 or 46 with a history of 3 consecutive normal Pap tests.  HPV screening.** / Every 3 years from ages 50  through ages 79 to 25 with a history of 3 consecutive normal Pap tests.  Hepatitis C blood test.** / For any individual with known risks for hepatitis C.  Skin self-exam. / Monthly.  Influenza vaccine. / Every year.  Tetanus, diphtheria, and acellular pertussis (Tdap, Td) vaccine.** / Consult your health care provider. Pregnant women should receive 1 dose of Tdap vaccine during each pregnancy. 1 dose of Td every 10 years.  Varicella vaccine.** / Consult your health care provider. Pregnant females who do not have evidence of immunity should receive the first dose after pregnancy.  HPV vaccine. / 3 doses over 6 months, if 63 and younger. The vaccine is not recommended for use in pregnant females. However, pregnancy testing is not needed before receiving a dose.  Measles, mumps, rubella (MMR) vaccine.** / You need at least 1 dose of MMR if you were born in 1957 or later. You may also need a 2nd dose. For females of childbearing age, rubella immunity should be determined. If there is no evidence of immunity, females who are not pregnant should be vaccinated. If there is no evidence of immunity, females who are pregnant should delay immunization until after pregnancy.  Pneumococcal 13-valent conjugate (PCV13) vaccine.** / Consult your health care provider.  Pneumococcal polysaccharide (PPSV23) vaccine.** / 1 to 2 doses if you smoke cigarettes or if you have certain conditions.  Meningococcal vaccine.** / 1 dose if you are age 78 to 62 years and a Market researcher living in a residence hall, or have one of several medical conditions, you need to get vaccinated against meningococcal disease. You may also need additional booster doses.  Hepatitis A vaccine.** / Consult your health care provider.  Hepatitis B vaccine.** / Consult your health care provider.  Haemophilus influenzae type b (Hib) vaccine.** / Consult your health care provider. Ages 75 to 8 years  Blood pressure check.** /  Every 1 to 2 years.  Lipid and cholesterol check.** / Every 5 years beginning at age 18 years.  Lung cancer screening. / Every year if you are aged 34-80 years and have a 30-pack-year history of smoking and currently smoke or have quit within the past 15 years. Yearly screening is stopped once you have quit smoking for at least 15 years or develop a health problem that would prevent you from having lung cancer treatment.  Clinical breast exam.** / Every year after age 76 years.  BRCA-related cancer risk assessment.** / For women who have family members with a BRCA-related cancer (breast, ovarian, tubal, or peritoneal cancers).  Mammogram.** / Every year beginning at age 15 years and continuing for as long as you are in good health. Consult with your health care provider.  Pap test.** / Every 3 years starting at age 16 years through age 34 or 28 years with a history of 3 consecutive normal Pap tests.  HPV screening.** / Every 3 years from ages 31 years through ages 74 to 46 years with a history of 3 consecutive normal Pap tests.  Fecal occult blood test (FOBT) of stool. / Every year beginning at age 66 years and continuing until age 77 years. You may not need to do this test if you get a colonoscopy every 10 years.  Flexible sigmoidoscopy or colonoscopy.** / Every 5 years for a flexible sigmoidoscopy or every 10  years for a colonoscopy beginning at age 68 years and continuing until age 45 years.  Hepatitis C blood test.** / For all people born from [birth year] through 1963 and any individual with known risks for hepatitis C.  Skin self-exam. / Monthly.  Influenza vaccine. / Every year.  Tetanus, diphtheria, and acellular pertussis (Tdap/Td) vaccine.** / Consult your health care provider. Pregnant women should receive 1 dose of Tdap vaccine during each pregnancy. 1 dose of Td every 10 years.  Varicella vaccine.** / Consult your health care provider. Pregnant females who do not have evidence of  immunity should receive the first dose after pregnancy.  Zoster vaccine.** / 1 dose for adults aged 48 years or older.  Measles, mumps, rubella (MMR) vaccine.** / You need at least 1 dose of MMR if you were born in 1957 or later. You may also need a 2nd dose. For females of childbearing age, rubella immunity should be determined. If there is no evidence of immunity, females who are not pregnant should be vaccinated. If there is no evidence of immunity, females who are pregnant should delay immunization until after pregnancy.  Pneumococcal 13-valent conjugate (PCV13) vaccine.** / Consult your health care provider.  Pneumococcal polysaccharide (PPSV23) vaccine.** / 1 to 2 doses if you smoke cigarettes or if you have certain conditions.  Meningococcal vaccine.** / Consult your health care provider.  Hepatitis A vaccine.** / Consult your health care provider.  Hepatitis B vaccine.** / Consult your health care provider.  Haemophilus influenzae type b (Hib) vaccine.** / Consult your health care provider. Ages 69 years and over  Blood pressure check.** / Every 1 to 2 years.  Lipid and cholesterol check.** / Every 5 years beginning at age 24 years.  Lung cancer screening. / Every year if you are aged 69-80 years and have a 30-pack-year history of smoking and currently smoke or have quit within the past 15 years. Yearly screening is stopped once you have quit smoking for at least 15 years or develop a health problem that would prevent you from having lung cancer treatment.  Clinical breast exam.** / Every year after age 60 years.  BRCA-related cancer risk assessment.** / For women who have family members with a BRCA-related cancer (breast, ovarian, tubal, or peritoneal cancers).  Mammogram.** / Every year beginning at age 78 years and continuing for as long as you are in good health. Consult with your health care provider.  Pap test.** / Every 3 years starting at age 82 years through age 39 or  32 years with 3 consecutive normal Pap tests. Testing can be stopped between 65 and 70 years with 3 consecutive normal Pap tests and no abnormal Pap or HPV tests in the past 10 years.  HPV screening.** / Every 3 years from ages 75 years through ages 64 or 44 years with a history of 3 consecutive normal Pap tests. Testing can be stopped between 65 and 70 years with 3 consecutive normal Pap tests and no abnormal Pap or HPV tests in the past 10 years.  Fecal occult blood test (FOBT) of stool. / Every year beginning at age 26 years and continuing until age 74 years. You may not need to do this test if you get a colonoscopy every 10 years.  Flexible sigmoidoscopy or colonoscopy.** / Every 5 years for a flexible sigmoidoscopy or every 10 years for a colonoscopy beginning at age 29 years and continuing until age 51 years.  Hepatitis C blood test.** / For all people born from  1945 through 1965 and any individual with known risks for hepatitis C.  Osteoporosis screening.** / A one-time screening for women ages 75 years and over and women at risk for fractures or osteoporosis.  Skin self-exam. / Monthly.  Influenza vaccine. / Every year.  Tetanus, diphtheria, and acellular pertussis (Tdap/Td) vaccine.** / 1 dose of Td every 10 years.  Varicella vaccine.** / Consult your health care provider.  Zoster vaccine.** / 1 dose for adults aged 49 years or older.  Pneumococcal 13-valent conjugate (PCV13) vaccine.** / Consult your health care provider.  Pneumococcal polysaccharide (PPSV23) vaccine.** / 1 dose for all adults aged 48 years and older.  Meningococcal vaccine.** / Consult your health care provider.  Hepatitis A vaccine.** / Consult your health care provider.  Hepatitis B vaccine.** / Consult your health care provider.  Haemophilus influenzae type b (Hib) vaccine.** / Consult your health care provider. ** Family history and personal history of risk and conditions may change your health care  provider's recommendations. Document Released: 07/19/2001 Document Revised: 10/07/2013 Document Reviewed: 10/18/2010 [facility] Patient Information 2015 [place], [place]. This information is not intended to replace advice given to you by your health care provider. Make sure you discuss any questions you have with your health care provider.

## 2015-02-27 NOTE — Progress Notes (Signed)
GYNECOLOGY CLINIC ANNUAL PREVENTATIVE CARE ENCOUNTER NOTE  Subjective:   Kelly Carr is a 44 y.o. [id number] female here for a routine annual gynecologic exam.  Was last seen here on 02/18/15 for vulvovaginitis; wet prep showed few clue cells and was treated with Metronidazole. Also was restarted on Sprintec given recent AUB; she reports this has improved, has some breast tenderness.   Current complaints: none.   Denies abnormal vaginal bleeding, discharge, pelvic pain, problems with intercourse or other gynecologic concerns.    Gynecologic History Patient's last menstrual period was 02/13/2015. Contraception: OCP (estrogen/progesterone) Last Pap: 04/17/2013. Results were: normal   Obstetric History OB History  Gravida Para Term Preterm AB SAB TAB Ectopic Multiple Living  # Outcome Date GA Lbr Len/2nd Weight Sex Delivery Anes PTL Lv  4 Term 08/12/00    F Vag-Spont   Y  3 Term 05/10/95    Genella Mech   Y  2 SAB           1 SAB               Past Medical History  Diagnosis Date  . Bipolar 1 disorder   . Abnormal Pap smear     Unknown results>colpo>normal  . Anxiety     History reviewed. No pertinent past surgical history.  Current Outpatient Prescriptions on File Prior to Visit  Medication Sig Dispense Refill  . benztropine (COGENTIN) 0.5 MG tablet Take 0.5 mg by mouth 2 (two) times daily.    Marland Kitchen buPROPion (WELLBUTRIN XL) 150 MG 24 hr tablet Take 150 mg by mouth daily.    Marland Kitchen LORazepam (ATIVAN) 0.5 MG tablet     . Melatonin 5 MG TABS Take 1 tablet by mouth at bedtime as needed. For sleep    . norgestimate-ethinyl estradiol (ORTHO-CYCLEN,SPRINTEC,PREVIFEM) 0.25-35 MG-MCG tablet Take 1 tablet by mouth daily. 1 Package 11  . ziprasidone (GEODON) 80 MG capsule Take 80 mg by mouth 2 (two) times daily with a meal.     No current facility-administered medications on file prior to visit.    Allergies  Allergen Reactions  . Haldol [Haloperidol Lactate] Other  (See Comments)    Syncope     Social History   Social History  . Marital Status: Single    Spouse Name: N/A  . Number of Children: N/A  . Years of Education: N/A   Occupational History  . Not on file.   Social History Main Topics  . Smoking status: Former Smoker    Types: Cigarettes  . Smokeless tobacco: Never Used  . Alcohol Use: No  . Drug Use: No  . Sexual Activity:    Partners: Male    Birth Control/ Protection: Pill   Other Topics Concern  . Not on file   Social History Narrative    Family History  Problem Relation Age of Onset  . Diabetes Mother   . Hypertension Mother   . Diabetes Maternal Grandmother   . Heart disease Maternal Grandmother   . Diabetes Maternal Grandfather   . Heart disease Maternal Grandfather     The following portions of the patient's history were reviewed and updated as appropriate: allergies, current medications, past family history, past medical history, past social history, past surgical history and problem list.  Review of Systems Pertinent items are noted in HPI.   Objective:  BP 119/75 mmHg  Pulse 85  Ht  (1.626 m)  Wt 141 lb (63.957 kg)  BMI 24.19 kg/m2  LMP 02/13/2015 CONSTITUTIONAL: Well-developed, well-nourished female in no acute distress.  HENT:  Normocephalic, atraumatic, External right and left ear normal. Oropharynx is clear and moist EYES: Conjunctivae and EOM are normal. Pupils are equal, round, and reactive to light. No scleral icterus.  NECK: Normal range of motion, supple, no masses.  Normal thyroid.  SKIN: Skin is warm and dry. No rash noted. Not diaphoretic. No erythema. No pallor. NEUROLGIC: Alert and oriented to person, place, and time. Normal reflexes, muscle tone coordination. No cranial nerve deficit noted. PSYCHIATRIC: Normal mood and affect. Normal behavior. Normal judgment and thought content. CARDIOVASCULAR: Normal heart rate noted, regular rhythm RESPIRATORY: Clear to auscultation  bilaterally. Effort and breath sounds normal, no problems with respiration noted. BREASTS: Symmetric in size. No masses, skin changes, nipple drainage, or lymphadenopathy. ABDOMEN: Soft, normal bowel sounds, no distention noted.  No tenderness, rebound or guarding.  PELVIC: Normal appearing external genitalia; normal appearing vaginal mucosa and cervix.  No abnormal discharge noted.  Pap smear obtained.  Normal uterine size, no other palpable masses, no uterine or adnexal tenderness. MUSCULOSKELETAL: Normal range of motion. No tenderness.  No cyanosis, clubbing, or edema.  2+ distal pulses.   Assessment:  Annual gynecologic examination with pap smear   Plan:  Will follow up results of pap smear and manage accordingly. Mammogram scheduled Routine preventative health maintenance measures emphasized. Please refer to After Visit Summary for other counseling recommendations.    Jaynie Collins, MD, FACOG Attending Obstetrician & Gynecologist, [facility]

## 2015-03-03 LAB — CYTOLOGY - PAP

## 2015-03-04 ENCOUNTER — Encounter: Payer: Self-pay | Admitting: Obstetrics & Gynecology

## 2015-03-04 DIAGNOSIS — R87612 Low grade squamous intraepithelial lesion on cytologic smear of cervix (LGSIL): Secondary | ICD-10-CM | POA: Insufficient documentation

## 2015-03-05 ENCOUNTER — Telehealth: Payer: Self-pay | Admitting: *Deleted

## 2015-03-05 NOTE — Telephone Encounter (Signed)
Pt returning call about results, informed pt of LSIL on pap and recommendation for Colpo, pt acknowledged and scheduled appt for Colpo with Dr Macon Large.

## 2015-03-10 ENCOUNTER — Encounter: Payer: Self-pay | Admitting: *Deleted

## 2015-03-12 ENCOUNTER — Encounter: Payer: Self-pay | Admitting: *Deleted

## 2015-03-17 ENCOUNTER — Encounter: Payer: 59 | Admitting: Obstetrics & Gynecology

## 2015-03-24 ENCOUNTER — Ambulatory Visit (INDEPENDENT_AMBULATORY_CARE_PROVIDER_SITE_OTHER): Payer: 59 | Admitting: Obstetrics & Gynecology

## 2015-03-24 ENCOUNTER — Encounter: Payer: Self-pay | Admitting: Obstetrics & Gynecology

## 2015-03-24 VITALS — BP 119/75 | HR 91 | Wt 139.0 lb

## 2015-03-24 DIAGNOSIS — Z01812 Encounter for preprocedural laboratory examination: Secondary | ICD-10-CM

## 2015-03-24 DIAGNOSIS — IMO0002 Reserved for concepts with insufficient information to code with codable children: Secondary | ICD-10-CM

## 2015-03-24 DIAGNOSIS — R87612 Low grade squamous intraepithelial lesion on cytologic smear of cervix (LGSIL): Secondary | ICD-10-CM

## 2015-03-24 DIAGNOSIS — R87810 Cervical high risk human papillomavirus (HPV) DNA test positive: Secondary | ICD-10-CM

## 2015-03-24 LAB — POCT URINE PREGNANCY: Preg Test, Ur: NEGATIVE

## 2015-03-24 NOTE — Progress Notes (Signed)
   Subjective:    Patient ID: Kelly Carr, female    DOB: 06-27-1970, 44 y.o.   MRN: [id number]  HPI  44 yo lady is here for a colpo. She uses OCPs for contraception. She had a normal pap last year but a LGSIL pap the year before.  Review of Systems     Objective:   Physical Exam  WNWHWFNAD UPT negative, consent signed, time out done Cervix prepped with acetic acid. Transformation zone seen in its entirety. Colpo adequate. punctation seen on most of the exocervix.  I biopsied an area of punctation at the 2 o'clock position ECC obtained. She tolerated the procedure well.        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

## 2015-04-01 ENCOUNTER — Encounter: Payer: Self-pay | Admitting: *Deleted

## 2015-04-02 ENCOUNTER — Ambulatory Visit (INDEPENDENT_AMBULATORY_CARE_PROVIDER_SITE_OTHER): Payer: 59 | Admitting: Obstetrics and Gynecology

## 2015-04-02 ENCOUNTER — Encounter: Payer: Self-pay | Admitting: Obstetrics and Gynecology

## 2015-04-02 VITALS — BP 119/77 | HR 88 | Wt 140.0 lb

## 2015-04-02 DIAGNOSIS — Z712 Person consulting for explanation of examination or test findings: Secondary | ICD-10-CM

## 2015-04-02 DIAGNOSIS — R87612 Low grade squamous intraepithelial lesion on cytologic smear of cervix (LGSIL): Secondary | ICD-10-CM | POA: Diagnosis not present

## 2015-04-02 NOTE — Progress Notes (Signed)
Patient ID: Kelly Carr, female   DOB: 1970-06-28, 44 y.o.   MRN: [id number] 2042 G4P2 here for colposcopy results. 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 Plan for repeat pap smear in 6 months Discussed reviewed and explained to the patient. All questions related to HPV were answered.  A total of 10 minutes were spent with this patient

## 2015-05-04 ENCOUNTER — Ambulatory Visit (INDEPENDENT_AMBULATORY_CARE_PROVIDER_SITE_OTHER): Payer: 59 | Admitting: Family Medicine

## 2015-05-04 ENCOUNTER — Ambulatory Visit (HOSPITAL_COMMUNITY)
Admission: RE | Admit: 2015-05-04 | Discharge: 2015-05-04 | Disposition: A | Discharge status: Home / Self Care | Payer: 59 | Source: Ambulatory Visit | Attending: Family Medicine | Admitting: Family Medicine

## 2015-05-04 VITALS — BP 121/69 | HR 73 | Temp 98.4°F | Ht 64.0 in | Wt 136.9 lb

## 2015-05-04 DIAGNOSIS — M25551 Pain in right hip: Secondary | ICD-10-CM | POA: Diagnosis not present

## 2015-05-04 DIAGNOSIS — R1010 Upper abdominal pain, unspecified: Secondary | ICD-10-CM

## 2015-05-04 DIAGNOSIS — Z23 Encounter for immunization: Secondary | ICD-10-CM

## 2015-05-04 DIAGNOSIS — R1012 Left upper quadrant pain: Secondary | ICD-10-CM | POA: Insufficient documentation

## 2015-05-04 LAB — COMPLETE METABOLIC PANEL WITH GFR
ALT: 21 U/L (ref 6–29)
AST: 16 U/L (ref 10–30)
Albumin: 3.6 g/dL (ref 3.6–5.1)
Alkaline Phosphatase: 34 U/L (ref 33–115)
BUN: 10 mg/dL (ref 7–25)
CO2: 25 mmol/L (ref 20–31)
Calcium: 8.9 mg/dL (ref 8.6–10.2)
Chloride: 105 mmol/L (ref 98–110)
Creat: 0.73 mg/dL (ref 0.50–1.10)
GFR, Est African American: >89 mL/min (ref >=60)
GFR, Est Non African American: >89 mL/min (ref >=60)
Glucose, Bld: 87 mg/dL (ref 65–99)
Potassium: 4.3 mmol/L (ref 3.5–5.3)
Sodium: 139 mmol/L (ref 135–146)
Total Bilirubin: 0.7 mg/dL (ref 0.2–1.2)
Total Protein: 6 g/dL — ABNORMAL LOW (ref 6.1–8.1)

## 2015-05-04 LAB — CBC
HCT: 35.1 % — ABNORMAL LOW (ref 36.0–46.0)
Hemoglobin: 11.7 g/dL — ABNORMAL LOW (ref 12.0–15.0)
MCH: 28.9 pg (ref 26.0–34.0)
MCHC: 33.3 g/dL (ref 30.0–36.0)
MCV: 86.7 fL (ref 78.0–100.0)
MPV: 11.7 fL (ref 8.6–12.4)
Platelets: 253 10*3/uL (ref 150–400)
RBC: 4.05 MIL/uL (ref 3.87–5.11)
RDW: 13.5 % (ref 11.5–15.5)
WBC: 6.3 10*3/uL (ref 4.0–10.5)

## 2015-05-04 LAB — LIPID PANEL
Cholesterol: 195 mg/dL (ref 125–200)
HDL: 47 mg/dL (ref >=46)
LDL Cholesterol: 127 mg/dL (ref <130)
Total CHOL/HDL Ratio: 4.1 Ratio (ref <=5.0)
Triglycerides: 103 mg/dL (ref <150)
VLDL: 21 mg/dL (ref <30)

## 2015-05-04 LAB — LIPASE: Lipase: 19 U/L (ref 7–60)

## 2015-05-04 LAB — AMYLASE: Amylase: 37 U/L (ref 0–105)

## 2015-05-04 MED ORDER — TRAMADOL HCL 50 MG PO TABS: 50.0000 mg | ORAL_TABLET | Freq: Three times a day (TID) | ORAL | Status: DC | PRN

## 2015-05-04 MED ORDER — OMEPRAZOLE 20 MG PO CPDR: 20.0000 mg | DELAYED_RELEASE_CAPSULE | Freq: Every day | ORAL | Status: DC

## 2015-05-04 NOTE — Patient Instructions (Signed)
Thank you for coming in,   [facility] to [facility] and the radiology department. There will be no appointment for x-rays and you can shop at any time.  I will call you with results from today.  He can take Tylenol for pain or tramadol for the pain is severe.  Using get aspercream cream to rub over the affected area.  Please stop using any anti-inflammatory such as mobic relief. Please start using the omeprazole.  Please bring all of your medications with you to each visit.   Sign up for My Chart to have easy access to your labs results, and communication with your Primary care physician   Please feel free to call with any questions or concerns at any time, at [phone]. --Dr. Jordan LikesSchmitz

## 2015-05-04 NOTE — Progress Notes (Signed)
Subjective:    Kelly Carr - 44 y.o. female MRN [id number]  Date of birth: 05-31-1971  HPI  Kelly Carr is here for right hip pain.  Right hip pain Location: lateral right hip but point to the gluteal region  Denies any radiculopathy or injury  Pain is staying the same Hurts to lie on that side at night  Has to use a heating pad.  Denies any prior x-rays  Pain started: about 4 months ago  Pain is: achy  Severity: 10/10 intermittent in nature  Medications tried: mobic and aleve. Improved with medications  Recent trauma: no Similar pain previously: no  Symptoms Redness:no Swelling:no Fever: no Weakness: no Weight loss: no Rash: no  Ab pain:  Upper abdomen pain  Not related to position  Has been taking NSAIDS  No black tarry stools and no hemoptysis  No loss of weight  Some pain after she eats.  Some nausea but no emesis.  She has a history of reflux and doesn't feel that it is related.   Health Maintenance:  Health Maintenance Due  Topic Date Due  . HIV Screening  03/19/1988  . INFLUENZA VACCINE  01/05/2015    -  reports that she has quit smoking. Her smoking use included Cigarettes. She has never used smokeless tobacco. - Review of Systems: Per HPI. - Past Medical History: Patient Active Problem List   Diagnosis Date Noted  . Pain of upper abdomen 05/04/2015  . 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 Right hip pain 12/27/2014  . Pharyngitis, chronic 08/11/2014  . Adjustment disorder with mixed anxiety and depressed mood 10/17/2013  . Irregular menses 07/17/2013  . GERD (gastroesophageal reflux disease) 11/10/2010  . Carpal tunnel syndrome of left wrist 11/10/2010  . Bipolar disorder (HCC) 11/25/2008  . OTH ABNORMAL PAPANICOLAOU SMEAR CERVIX&CERV HPV 11/25/2008  . DEPRESSION 10/21/2008   - Medications: reviewed and updated Current Outpatient Prescriptions  Medication Sig Dispense Refill  .  benztropine (COGENTIN) 0.5 MG tablet Take 0.5 mg by mouth 2 (two) times daily.    Marland Kitchen. buPROPion (WELLBUTRIN XL) 150 MG 24 hr tablet Take 150 mg by mouth daily.    Marland Kitchen. LORazepam (ATIVAN) 0.5 MG tablet     . Melatonin 5 MG TABS Take 1 tablet by mouth at bedtime as needed. For sleep    . norgestimate-ethinyl estradiol (ORTHO-CYCLEN,SPRINTEC,PREVIFEM) 0.25-35 MG-MCG tablet Take 1 tablet by mouth daily. 1 Package 11  . omeprazole (PRILOSEC) 20 MG capsule Take 1 capsule (20 mg total) by mouth daily. 30 capsule 3  . traMADol (ULTRAM) 50 MG tablet Take 1 tablet (50 mg total) by mouth every 8 (eight) hours as needed. 20 tablet 0  . ziprasidone (GEODON) 80 MG capsule Take 80 mg by mouth 2 (two) times daily with a meal.     No current facility-administered medications for this visit.     Review of Systems See HPI     Objective:   Physical Exam BP 121/69 mmHg  Pulse 73  Temp(Src) 98.4 F (36.9 C) (Oral)  Ht 5\' 4"  (1.626 m)  Wt 136 lb 14.4 oz (62.097 kg)  BMI 23.49 kg/m2  LMP 04/13/2015 (Approximate) Gen: NAD, alert, cooperative with exam, well-appearing  Abd: SNTND, BS present, no guarding or organomegaly Skin: no rashes, normal turgor  Neuro: no gross deficits.  Psych: alert and oriented MSK:  Back:  Appearance: No obvious deficit Palpation: No tenderness upon the lumbar region or spinal tenderness Flexion: Normal  Extension: Normal Right Hip:  Appearance: No obvious deficit Palpation: Some tenderness to palpation in the gluteal region but not over the greater trochanter Rotation Reduced: excessive internal with normal external  FADIR: normal b/l  FABER: right side with some pain  Neuro: Strength hip flexion 5/5, hip abduction 4/5 on right, hip adduction 5/5,  knee extension 5/5, knee flexion 5/5, dorsiflexion 5/5, plantar flexion 5/5 Reflexes: patella 2/2 Bilateral  Achilles 2/2 Bilateral Straight Leg Raise: negative Sensation to light touch intact: yes Normal gait    Assessment &  Plan:   Right hip pain Pain appears to be more gluteal medius versus greater trochanter bursitis in nature. No prior imaging to compare Unable to perform physical therapy due to time constraints with working - X-rays today -We'll avoid NSAIDs as it seems that she may have an ulcer. Tylenol for pain and tramadol #20 given for severe pain  -Given home modalities - Advised Aspercreme to rub over the affected area - If there is no improvement in 3-4 weeks then may need to try greater trochanter injection versus ultrasound-guided right hip injection   Pain of upper abdomen Pain has been occurring fairly recent. Centralized in nature with some relation to food She has a history of reflux but doesn't appear to be a similar to that She has been using NSAIDs for her hip pain which may contribute to an ulcer - Labs today - Start omeprazole 20 mg daily - Avoid all NSAIDs - If still having pain in 4 weeks then consider referral to GI for EGD

## 2015-05-04 NOTE — Assessment & Plan Note (Signed)
Pain has been occurring fairly recent. Centralized in nature with some relation to food She has a history of reflux but doesn't appear to be a similar to that She has been using NSAIDs for her hip pain which may contribute to an ulcer - Labs today - Start omeprazole 20 mg daily - Avoid all NSAIDs - If still having pain in 4 weeks then consider referral to GI for EGD

## 2015-05-04 NOTE — Assessment & Plan Note (Signed)
Pain appears to be more gluteal medius versus greater trochanter bursitis in nature. No prior imaging to compare Unable to perform physical therapy due to time constraints with working - X-rays today -We'll avoid NSAIDs as it seems that she may have an ulcer. Tylenol for pain and tramadol #20 given for severe pain  -Given home modalities - Advised Aspercreme to rub over the affected area - If there is no improvement in 3-4 weeks then may need to try greater trochanter injection versus ultrasound-guided right hip injection

## 2015-05-05 ENCOUNTER — Telehealth: Payer: Self-pay | Admitting: Family Medicine

## 2015-05-05 DIAGNOSIS — D509 Iron deficiency anemia, unspecified: Secondary | ICD-10-CM

## 2015-05-05 NOTE — Telephone Encounter (Signed)
Pt called back and would like for you to put in future orders for labs.  I told her that she could call and schedule this and then once PCP received results he would determine next steps and would notify her. Kelly Carr, Kelly Carr, [place]

## 2015-05-05 NOTE — Telephone Encounter (Signed)
Left VM for patient. If she calls back please have her speak with a nurse/CMA and inform that her x-rays are normal. Her lab work is normal except she has some anemia. Looks like she has had this for some tim but hasn't been looked into. She can follow up or I can put in future orders and she can schedule a lab visit. Ask which she would like to do Please? .   If any questions then please take the best time and phone number to call and I will try to call her back.   Myra RudeJeremy E Schmitz, MD PGY-3, [facility] 05/05/2015, 1:42 PM

## 2015-05-14 ENCOUNTER — Telehealth: Payer: Self-pay | Admitting: Family Medicine

## 2015-05-14 NOTE — Telephone Encounter (Signed)
Pt calling to report that the Tramadol prescribed makes her itch uncontrollably. She states that she understands that this is a side affect but she has not taken anymore. Pt would like to know if there is something else the provider could suggest to help her with her hip pain. Thank you, Dorothey BasemanSadie Reynolds, ASA

## 2015-05-14 NOTE — Telephone Encounter (Signed)
Unable to leave VM for patient as mailbox was full. If she calls back please have her speak with a nurse/CMA and inform that she doesn't need to take the tramadol. It is to be used only when her pain is severe. She can take tylenol with the naproxen. There is a stronger anti-inflammatory called prednisone that I could offer and see if that helps. It is a 7 days course.   If any questions then please take the best time and phone number to call and I will try to call her back.   Myra RudeJeremy E Brittanee Ghazarian, MD PGY-3, [facility] 05/14/2015, 2:01 PM

## 2015-05-15 ENCOUNTER — Other Ambulatory Visit: Payer: 59

## 2015-05-15 DIAGNOSIS — D509 Iron deficiency anemia, unspecified: Secondary | ICD-10-CM

## 2015-05-15 NOTE — Progress Notes (Signed)
Anemia panel 7 done today Kelly Carr 

## 2015-05-16 LAB — ANEMIA PANEL 7
%SAT: 11 % (ref 11–50)
ABS Retic: 40.3 10*3/uL (ref 19.0–186.0)
Ferritin: 5 ng/mL — ABNORMAL LOW (ref 10–291)
Folate: >20 ng/mL
HCT: 34.9 % — ABNORMAL LOW (ref 36.0–46.0)
Hemoglobin: 11.5 g/dL — ABNORMAL LOW (ref 12.0–15.0)
Iron: 42 ug/dL (ref 40–190)
MCH: 28.5 pg (ref 26.0–34.0)
MCHC: 33 g/dL (ref 30.0–36.0)
MCV: 86.6 fL (ref 78.0–100.0)
MPV: 11 fL (ref 8.6–12.4)
Platelets: 281 10*3/uL (ref 150–400)
RBC.: 4.03 MIL/uL (ref 3.87–5.11)
RBC: 4.03 MIL/uL (ref 3.87–5.11)
RDW: 13.6 % (ref 11.5–15.5)
Retic Ct Pct: 1 % (ref 0.4–2.3)
TIBC: 397 ug/dL (ref 250–450)
UIBC: 355 ug/dL (ref 125–400)
Vitamin B-12: 500 pg/mL (ref 211–911)
WBC: 6.2 10*3/uL (ref 4.0–10.5)

## 2015-05-18 ENCOUNTER — Other Ambulatory Visit: Payer: 59

## 2015-05-25 ENCOUNTER — Encounter: Payer: Self-pay | Admitting: Family Medicine

## 2015-05-25 ENCOUNTER — Ambulatory Visit (INDEPENDENT_AMBULATORY_CARE_PROVIDER_SITE_OTHER): Payer: 59 | Admitting: Family Medicine

## 2015-05-25 VITALS — BP 112/62 | HR 73 | Temp 98.2°F | Wt 135.2 lb

## 2015-05-25 DIAGNOSIS — R300 Dysuria: Secondary | ICD-10-CM

## 2015-05-25 DIAGNOSIS — D649 Anemia, unspecified: Secondary | ICD-10-CM

## 2015-05-25 DIAGNOSIS — M25551 Pain in right hip: Secondary | ICD-10-CM

## 2015-05-25 LAB — POCT URINALYSIS DIPSTICK

## 2015-05-25 LAB — POCT UA - MICROSCOPIC ONLY: RBC, urine, microscopic: >20

## 2015-05-25 NOTE — Progress Notes (Signed)
Subjective:    Kelly Carr - 44 y.o. female MRN [id number]  Date of birth: [birth year]  HPI  Kelly Carr is here for dysuria.  DYSURIA  Pain urinating started 3 days ago. Pain is: burning  Medications tried: azo Any antibiotics in the last 30 days: no More than 3 UTIs in the last 12 months: no STD exposure: no Possibly pregnant: no  Symptoms Urgency: yes Frequency: yes Blood in urine: no Pain in back:yes Fever: no Vaginal discharge: no Mouth Ulcers: no    Hip pain  Still occuring in her right hip.  She feels like it is deep in her hip.  Imaging showing good joint space and only mild arthritic changes.  Is unable to perform physical therapy secondary to working two jobs.   Anemia:  Work up is revealing for iron deficiency  She hasn't taken iron before  She is still having regular menstrual cycles    Health Maintenance:  Has receive flu vaccine  Health Maintenance Due  Topic Date Due  . HIV Screening  03/19/1988    -  reports that she has quit smoking. Her smoking use included Cigarettes. She has never used smokeless tobacco. - Review of Systems: Per HPI. - Past Medical History: Patient Active Problem List   Diagnosis Date Noted  . Dysuria 05/26/2015  . Normocytic anemia 05/26/2015  . Pain of upper abdomen 05/04/2015  . 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 Right hip pain 12/27/2014  . Pharyngitis, chronic 08/11/2014  . Adjustment disorder with mixed anxiety and depressed mood 10/17/2013  . Irregular menses 07/17/2013  . GERD (gastroesophageal reflux disease) 11/10/2010  . Carpal tunnel syndrome of left wrist 11/10/2010  . Bipolar disorder (HCC) 11/25/2008  . OTH ABNORMAL PAPANICOLAOU SMEAR CERVIX&CERV HPV 11/25/2008  . DEPRESSION 10/21/2008   - Medications: reviewed and updated Current Outpatient Prescriptions  Medication Sig Dispense Refill  . benztropine (COGENTIN) 0.5 MG tablet Take 0.5 mg by  mouth 2 (two) times daily.    Marland Kitchen. buPROPion (WELLBUTRIN XL) 150 MG 24 hr tablet Take 150 mg by mouth daily.    Marland Kitchen. LORazepam (ATIVAN) 0.5 MG tablet     . Melatonin 5 MG TABS Take 1 tablet by mouth at bedtime as needed. For sleep    . norgestimate-ethinyl estradiol (ORTHO-CYCLEN,SPRINTEC,PREVIFEM) 0.25-35 MG-MCG tablet Take 1 tablet by mouth daily. 1 Package 11  . omeprazole (PRILOSEC) 20 MG capsule Take 1 capsule (20 mg total) by mouth daily. 30 capsule 3  . traMADol (ULTRAM) 50 MG tablet Take 1 tablet (50 mg total) by mouth every 8 (eight) hours as needed. 20 tablet 0  . ziprasidone (GEODON) 80 MG capsule Take 80 mg by mouth 2 (two) times daily with a meal.     No current facility-administered medications for this visit.     Review of Systems See HPI     Objective:   Physical Exam BP 112/62 mmHg  Pulse 73  Temp(Src) 98.2 F (36.8 C) (Oral)  Wt 135 lb 3.2 oz (61.326 kg)  LMP 05/17/2015 (Exact Date) Gen: NAD, alert, cooperative with exam, well-appearing CV: RRR, good S1/S2, no murmur,   Resp: CTABL, no wheezes, non-labored Skin: no rashes, normal turgor  Psych: alert and oriented Back: No CVA tenderness Hip:  Appearance: no obvious defects  Palpation: No tenderness to the greater trochanter or IT band. Range of motion: Excessive internal rotation and normal external She feels like the pain is deeper inside her hip Neuro:  Strength hip flexion 5/5, knee extension 5/5, knee flexion 5/5, dorsiflexion 5/5, plantar flexion 5/5 Reflexes: patella 2/2 Bilateral  Achilles 2/2 Bilateral Neurovascularly intact Normal gait     Assessment & Plan:   Right hip pain Still feel the pain is most likely gluteal medius in nature I will feel that this is a bursitis with minimal to no tenderness upon palates patient of the greater trochanter She is unable to do physical therapy due to time constraints and has not been doing many of the home exercises Imaging is not revealing for significant  arthritis She reported itching with tramadol use - Advised to use Aspercreme over the affected area - Referral to sports medicine placed for consideration of ultrasound guided hip injection.  Dysuria Symptoms suggestive of a bacterial infection Unable to evaluate the urinalysis due to taking Azo - Urine culture pending and will treat based on results - Encourage cranberry juice and drinking plenty of water  Normocytic anemia Most likely component of iron deficiency with ferritin less than 5 Secondary to her menstruation most likely She has problems with constipation at baseline and wants to avoid taking an iron pill for now - Encouraged green leafy vegetables and a multivitamin containing iron - Can repeat ferritin in 6 months to see any improvement and may need to start ferrous sulfate

## 2015-05-25 NOTE — Patient Instructions (Addendum)
Thank you for coming in,   I will call with the results of your urine culture.   Sign up for My Chart to have easy access to your labs results, and communication with your Primary care physician   Please feel free to call with any questions or concerns at any time, at [phone]. --Dr. Jordan LikesSchmitz Urinary Tract Infection Urinary tract infections (UTIs) can develop anywhere along your urinary tract. Your urinary tract is your body's drainage system for removing wastes and extra water. Your urinary tract includes two kidneys, two ureters, a bladder, and a urethra. Your kidneys are a pair of bean-shaped organs. Each kidney is about the size of your fist. They are located below your ribs, one on each side of your spine. CAUSES Infections are caused by microbes, which are microscopic organisms, including fungi, viruses, and bacteria. These organisms are so small that they can only be seen through a microscope. Bacteria are the microbes that most commonly cause UTIs. SYMPTOMS  Symptoms of UTIs may vary by age and gender of the patient and by the location of the infection. Symptoms in young women typically include a frequent and intense urge to urinate and a painful, burning feeling in the bladder or urethra during urination. Older women and men are more likely to be tired, shaky, and weak and have muscle aches and abdominal pain. A fever may mean the infection is in your kidneys. Other symptoms of a kidney infection include pain in your back or sides below the ribs, nausea, and vomiting. DIAGNOSIS To diagnose a UTI, your caregiver will ask you about your symptoms. Your caregiver will also ask you to provide a urine sample. The urine sample will be tested for bacteria and white blood cells. White blood cells are made by your body to help fight infection. TREATMENT  Typically, UTIs can be treated with medication. Because most UTIs are caused by a bacterial infection, they usually can be treated with the use of  antibiotics. The choice of antibiotic and length of treatment depend on your symptoms and the type of bacteria causing your infection. HOME CARE INSTRUCTIONS  If you were prescribed antibiotics, take them exactly as your caregiver instructs you. Finish the medication even if you feel better after you have only taken some of the medication.  Drink enough water and fluids to keep your urine clear or pale yellow.  Avoid caffeine, tea, and carbonated beverages. They tend to irritate your bladder.  Empty your bladder often. Avoid holding urine for long periods of time.  Empty your bladder before and after sexual intercourse.  After a bowel movement, women should cleanse from front to back. Use each tissue only once. SEEK MEDICAL CARE IF:   You have back pain.  You develop a fever.  Your symptoms do not begin to resolve within 3 days. SEEK IMMEDIATE MEDICAL CARE IF:   You have severe back pain or lower abdominal pain.  You develop chills.  You have nausea or vomiting.  You have continued burning or discomfort with urination. MAKE SURE YOU:   Understand these instructions.  Will watch your condition.  Will get help right away if you are not doing well or get worse.   This information is not intended to replace advice given to you by your health care provider. Make sure you discuss any questions you have with your health care provider.   Document Released: 03/02/2005 Document Revised: 02/11/2015 Document Reviewed: 07/01/2011 Elsevier Interactive Patient Education [employer]2016 Elsevier Inc.

## 2015-05-26 DIAGNOSIS — D649 Anemia, unspecified: Secondary | ICD-10-CM | POA: Insufficient documentation

## 2015-05-26 DIAGNOSIS — R300 Dysuria: Secondary | ICD-10-CM | POA: Insufficient documentation

## 2015-05-26 NOTE — Assessment & Plan Note (Signed)
Still feel the pain is most likely gluteal medius in nature I will feel that this is a bursitis with minimal to no tenderness upon palates patient of the greater trochanter She is unable to do physical therapy due to time constraints and has not been doing many of the home exercises Imaging is not revealing for significant arthritis She reported itching with tramadol use - Advised to use Aspercreme over the affected area - Referral to sports medicine placed for consideration of ultrasound guided hip injection.

## 2015-05-26 NOTE — Assessment & Plan Note (Signed)
Most likely component of iron deficiency with ferritin less than 5 Secondary to her menstruation most likely She has problems with constipation at baseline and wants to avoid taking an iron pill for now - Encouraged green leafy vegetables and a multivitamin containing iron - Can repeat ferritin in 6 months to see any improvement and may need to start ferrous sulfate

## 2015-05-26 NOTE — Assessment & Plan Note (Signed)
Symptoms suggestive of a bacterial infection Unable to evaluate the urinalysis due to taking Azo - Urine culture pending and will treat based on results - Encourage cranberry juice and drinking plenty of water

## 2015-05-27 ENCOUNTER — Telehealth: Payer: Self-pay | Admitting: Family Medicine

## 2015-05-27 LAB — URINE CULTURE: Colony Count: 9000

## 2015-05-27 NOTE — Telephone Encounter (Signed)
Would like test results from Monday

## 2015-05-28 ENCOUNTER — Ambulatory Visit (INDEPENDENT_AMBULATORY_CARE_PROVIDER_SITE_OTHER): Payer: 59 | Admitting: Family Medicine

## 2015-05-28 ENCOUNTER — Other Ambulatory Visit (HOSPITAL_COMMUNITY)
Admission: RE | Admit: 2015-05-28 | Discharge: 2015-05-28 | Disposition: A | Discharge status: Home / Self Care | Payer: 59 | Source: Ambulatory Visit | Attending: Family Medicine | Admitting: Family Medicine

## 2015-05-28 VITALS — BP 109/68 | HR 78 | Temp 98.6°F | Wt 134.1 lb

## 2015-05-28 DIAGNOSIS — Z113 Encounter for screening for infections with a predominantly sexual mode of transmission: Secondary | ICD-10-CM | POA: Insufficient documentation

## 2015-05-28 DIAGNOSIS — R300 Dysuria: Secondary | ICD-10-CM | POA: Diagnosis not present

## 2015-05-28 LAB — POCT UA - MICROSCOPIC ONLY

## 2015-05-28 LAB — POCT URINALYSIS DIPSTICK
Bilirubin, UA: NEGATIVE
Blood, UA: NEGATIVE
Glucose, UA: NEGATIVE
Ketones, UA: NEGATIVE
Nitrite, UA: NEGATIVE
Protein, UA: NEGATIVE
Spec Grav, UA: 1.02
Urobilinogen, UA: 0.2
pH, UA: 7

## 2015-05-28 LAB — POCT URINE PREGNANCY: Preg Test, Ur: NEGATIVE

## 2015-05-28 LAB — POCT WET PREP (WET MOUNT): Clue Cells Wet Prep Whiff POC: NEGATIVE

## 2015-05-28 MED ORDER — CEFTRIAXONE SODIUM 250 MG IJ SOLR: 250.0000 mg | Freq: Once | INTRAMUSCULAR | Status: DC

## 2015-05-28 MED ORDER — AZITHROMYCIN 500 MG PO TABS
1000.0000 mg | ORAL_TABLET | Freq: Once | ORAL | Status: DC
Start: 2015-05-28 — End: 2015-09-28

## 2015-05-28 NOTE — Patient Instructions (Signed)
Pelvic Inflammatory Disease °Pelvic inflammatory disease (PID) refers to an infection in some or all of the female organs. The infection can be in the uterus, ovaries, fallopian tubes, or the surrounding tissues in the pelvis. PID can cause abdominal or pelvic pain that comes on suddenly (acute pelvic pain). PID is a serious infection because it can lead to lasting (chronic) pelvic pain or the inability to have children (infertility). °CAUSES °This condition is most often caused by an infection that is spread during sexual contact. However, the infection can also be caused by the normal bacteria that are found in the vaginal tissues if these bacteria travel upward into the reproductive organs. PID can also occur following: °· The birth of a baby. °· A miscarriage. °· An abortion. °· Major pelvic surgery. °· The use of an intrauterine device (IUD). °· A sexual assault. °RISK FACTORS °This condition is more likely to develop in women who: °· Are younger than 42 years of age. °· Are sexually active at a young age. °· Use nonbarrier contraception. °· Have multiple sexual partners. °· Have sex with someone who has symptoms of an STD (sexually transmitted disease). °· Use oral contraception. °At times, certain behaviors can also increase the possibility of getting PID, such as: °· Using a vaginal douche. °· Having an IUD in place. °SYMPTOMS °Symptoms of this condition include: °· Abdominal or pelvic pain. °· Fever. °· Chills. °· Abnormal vaginal discharge. °· Abnormal uterine bleeding. °· Unusual pain shortly after the end of a menstrual period. °· Painful urination. °· Pain with sexual intercourse. °· Nausea and vomiting. °DIAGNOSIS °To diagnose this condition, your health care provider will do a physical exam and take your medical history. A pelvic exam typically reveals great tenderness in the uterus and the surrounding pelvic tissues. You may also have tests, such as: °· Lab tests, including a pregnancy test, blood  tests, and urine test. °· Culture tests of the vagina and cervix to check for an STD. °· Ultrasound. °· A laparoscopic procedure to look inside the pelvis. °· Examining vaginal secretions under a microscope. °TREATMENT °Treatment for this condition may involve one or more approaches. °· Antibiotic medicines may be prescribed to be taken by mouth. °· Sexual partners may need to be treated if the infection is caused by an STD. °· For more severe cases, hospitalization may be needed to give antibiotics directly into a vein through an IV tube. °· Surgery may be needed if other treatments do not help, but this is rare. °It may take weeks until you are completely well. If you are diagnosed with PID, you should also be checked for human immunodeficiency virus (HIV). Your health care provider may test you for infection again 3 months after treatment. You should not have unprotected sex. °HOME CARE INSTRUCTIONS °· Take over-the-counter and prescription medicines only as told by your health care provider. °· If you were prescribed an antibiotic medicine, take it as told by your health care provider. Do not stop taking the antibiotic even if you start to feel better. °· Do not have sexual intercourse until treatment is completed or as told by your health care provider. If PID is confirmed, your recent sexual partners will need treatment, especially if you had unprotected sex. °· Keep all follow-up visits as told by your health care provider. This is important. °SEEK MEDICAL CARE IF: °· You have increased or abnormal vaginal discharge. °· Your pain does not improve. °· You vomit. °· You have a fever. °· You   cannot tolerate your medicines. °· Your partner has an STD. °· You have pain when you urinate. °SEEK IMMEDIATE MEDICAL CARE IF: °· You have increased abdominal or pelvic pain. °· You have chills. °· Your symptoms are not better in 72 hours even with treatment. °  °This information is not intended to replace advice given to  you by your health care provider. Make sure you discuss any questions you have with your health care provider. °  °Document Released: 05/23/2005 Document Revised: 02/11/2015 Document Reviewed: 06/30/2014 °Elsevier Interactive Patient Education ©2016 Elsevier Inc. ° °

## 2015-05-28 NOTE — Progress Notes (Signed)
DYSURIA  Pain urinating started 7-10 days ago. Pain is: at or near the urethra, both while voiding and at rest Medications tried: no Any antibiotics in the last 30 days: no More than 3 UTIs in the last 12 months: no STD exposure: unknown but possible exposure due to sexual partner have intercourse outside relationship Possibly pregnant: no  Symptoms Urgency: yes Frequency: yes Blood in urine: no Pain in back: yes, low back Fever: no Vaginal discharge: no Mouth Ulcers: no  Review of Symptoms - see HPI PMH - Smoking status noted.    Objective: BP 109/68 mmHg  Pulse 78  Temp(Src) 98.6 F (37 C) (Oral)  Wt 134 lb 1.6 oz (60.827 kg)  LMP 05/17/2015 (Exact Date) Gen: NAD, alert, cooperative CV: RRR, no murmur Resp: CTAB, no wheezes, non-labored Abd: SNTND, BS present, no guarding or organomegaly Ext: No edema, warm Female genitalia: Vulva: normal appearing vulva with no masses, tenderness or lesions Vagina: normal appearing vagina with normal color and discharge, no lesions Cervix: cervical discharge present - milky, cervical motion tenderness present and lesions absent exam limited by tenderness Nurse present for Exam.  Assessment and plan:  Dysuria Patient presents w/ continued sxs of Dysuria. Unknown etiology at this time. Was previously treated conservatively but symptoms have not improved. Some concern for PID at this time due to her partner's possible multiple sexual partners. Significant cervical motion tenderness on exam w/ likely purulent cervical discharge. UA unremarkable.  - Will treat for GC/chlamydia   - Ceftriaxone 250mg  IM/Azithromycin 1g PO - GC/chlamydia obtained and pending.   06/01/2015 Follow-up: I called patient to inform of negative GC/chlamydia results. Also, interested in whether she has had any improvement in symptoms. Was unable to reach her. Asked that she call our office. - if sxs persist: consider Rx for UTI - if symptoms have resolved: just  monitor    Orders Placed This Encounter  Procedures  . Urinalysis Dipstick  . POCT UA - Microscopic Only  . POCT Wet Prep [employer](Wet [place])  . POCT urine pregnancy    Meds ordered this encounter  Medications  . azithromycin (ZITHROMAX) 500 MG tablet    Sig: Take 2 tablets (1,000 mg total) by mouth once.    Dispense:  2 tablet    Refill:  0  . cefTRIAXone (ROCEPHIN) 250 MG injection    Sig: Inject 250 mg into the muscle once.  FOR IM use in LARGE MUSCLE MASS    Dispense:  1 each    Refill:  0     Kathee DeltonIan D McKeag, MD,MS,  PGY2 06/01/2015 7:30 PM

## 2015-05-29 LAB — CERVICOVAGINAL ANCILLARY ONLY
Chlamydia: NEGATIVE
Neisseria Gonorrhea: NEGATIVE

## 2015-06-01 NOTE — Assessment & Plan Note (Signed)
Patient presents w/ continued sxs of Dysuria. Unknown etiology at this time. Was previously treated conservatively but symptoms have not improved. Some concern for PID at this time due to her partner's possible multiple sexual partners. Significant cervical motion tenderness on exam w/ likely purulent cervical discharge. UA unremarkable.  - Will treat for GC/chlamydia   - Ceftriaxone 250mg  IM/Azithromycin 1g PO - GC/chlamydia obtained and pending.   06/01/2015 Follow-up: I called patient to inform of negative GC/chlamydia results. Also, interested in whether she has had any improvement in symptoms. Was unable to reach her. Asked that she call our office. - if sxs persist: consider Rx for UTI - if symptoms have resolved: just monitor

## 2015-06-02 ENCOUNTER — Telehealth: Payer: Self-pay | Admitting: Family Medicine

## 2015-06-02 NOTE — Telephone Encounter (Signed)
Pt called and would her test results . jw

## 2015-06-02 NOTE — Telephone Encounter (Signed)
Left VM for patient. If she calls back please have her speak with a nurse/CMA and inform that I have sent a letter with her results. If she would like to discuss them then I can call her back. Make sure that a specific time is taken in which to call her back.   If any questions then please take the best time and phone number to call and I will try to call her back.   Myra RudeJeremy E Schmitz, MD PGY-3, [facility] 06/02/2015, 2:20 PM

## 2015-06-09 NOTE — Telephone Encounter (Signed)
Pt called. She has not received the letter. She would like Dr. Jordan LikesSchmitz to give her a call. She can be reached after 3:30 at [phone]. Sunday SpillersSharon T Saunders, CMA

## 2015-06-10 NOTE — Telephone Encounter (Signed)
Pt called again about results 

## 2015-06-11 NOTE — Telephone Encounter (Signed)
Discussed with patient about her lab results.   Kelly RudeJeremy E Ronika Kelson, MD PGY-3, [facility] 06/11/2015, 8:19 AM

## 2015-06-12 ENCOUNTER — Ambulatory Visit: Payer: 59 | Admitting: Family Medicine

## 2015-06-30 ENCOUNTER — Other Ambulatory Visit: Payer: Self-pay | Admitting: Obstetrics and Gynecology

## 2015-07-28 ENCOUNTER — Telehealth: Payer: Self-pay

## 2015-07-28 NOTE — Telephone Encounter (Signed)
Patient was seen at [facility] in October 2016, recommended a f/u-rpt pap smear around 10/01/2015. Called patient on both home phone [phone] and cell phone [phone]. The home phone was completely disconnected. Her cell phone went to voicemail, however her mailbox is full and I was unable to leave a message. Will try again at a later date/time

## 2015-08-25 ENCOUNTER — Ambulatory Visit: Payer: Self-pay | Admitting: Family Medicine

## 2015-09-21 ENCOUNTER — Telehealth: Payer: Self-pay | Admitting: *Deleted

## 2015-09-21 DIAGNOSIS — B3731 Acute candidiasis of vulva and vagina: Secondary | ICD-10-CM

## 2015-09-21 DIAGNOSIS — B373 Candidiasis of vulva and vagina: Secondary | ICD-10-CM

## 2015-09-21 MED ORDER — FLUCONAZOLE 150 MG PO TABS: 150.0000 mg | ORAL_TABLET | Freq: Once | ORAL | Status: DC

## 2015-09-21 NOTE — Telephone Encounter (Signed)
-----   Message from Kelly BowensJacinda S Battle sent at 09/21/2015  1:44 PM EDT ----- Regarding: Advise Contact: [phone] Called and states she thinks she has a yeast infection, she states she wore a pair of underwear that was too small and it rubbed a raw spot on her, she treated it w/ A&D ointment but now has this internal itching, no cottage cheese discharge, no odor, currently on cycle, wants to know if she can treat it OTC and what would be a better recommendation as she heard the one day treatment isn't all that effective

## 2015-09-21 NOTE — Telephone Encounter (Signed)
Pt wore underwear that was too small and irritated her vulva, is now c/o internal irritation as well.  Denies any discharge with a foul smelling odor or pain.  Will treat for probable yeast and pt will call back if symptoms persist after treatment.

## 2015-09-28 ENCOUNTER — Encounter: Payer: Self-pay | Admitting: Obstetrics and Gynecology

## 2015-09-28 ENCOUNTER — Ambulatory Visit (INDEPENDENT_AMBULATORY_CARE_PROVIDER_SITE_OTHER): Payer: BLUE CROSS/BLUE SHIELD | Admitting: Obstetrics and Gynecology

## 2015-09-28 VITALS — BP 110/67 | HR 108 | Resp 18 | Ht 64.0 in | Wt 135.0 lb

## 2015-09-28 DIAGNOSIS — Z3202 Encounter for pregnancy test, result negative: Secondary | ICD-10-CM | POA: Diagnosis not present

## 2015-09-28 DIAGNOSIS — N760 Acute vaginitis: Secondary | ICD-10-CM

## 2015-09-28 DIAGNOSIS — T384X6A Underdosing of oral contraceptives, initial encounter: Secondary | ICD-10-CM

## 2015-09-28 LAB — POCT URINE PREGNANCY: Preg Test, Ur: NEGATIVE

## 2015-09-28 NOTE — Progress Notes (Signed)
   Subjective:    Patient ID: Kelly Carr, female    DOB: June 27, 1970, 45 y.o.   MRN: [id number]  HPI 45 yo [id number] here for the evaluation of vaginitis. Patient reports onset of vulva irritation 2 weeks ago following the use of tight underwear on a sweaty day. She was treated with diflucan a week ago and has used vagisil. She denies the presence of a discharge or foul odor. She reports a lot of vaginal pruritis  Past Medical History  Diagnosis Date  . Bipolar 1 disorder (HCC)   . Abnormal Pap smear     Unknown results>colpo>normal  . Anxiety    History reviewed. No pertinent past surgical history. Family History  Problem Relation Age of Onset  . Diabetes Mother   . Hypertension Mother   . Diabetes Maternal Grandmother   . Heart disease Maternal Grandmother   . Diabetes Maternal Grandfather   . Heart disease Maternal Grandfather    Social History  Substance Use Topics  . Smoking status: Former Smoker    Types: Cigarettes  . Smokeless tobacco: Never Used  . Alcohol Use: No      Review of Systems See pertinent in HPI    Objective:   Physical Exam GENERAL: Well-developed, well-nourished female in no acute distress.  ABDOMEN: Soft, nontender, nondistended. No organomegaly. PELVIC: Normal external female genitalia with erythematous labia bilaterally. Vagina is pink and rugated.  Normal discharge. Normal appearing cervix. Uterus is normal in size. No adnexal mass or tenderness. EXTREMITIES: No cyanosis, clubbing, or edema, 2+ distal pulses.     Assessment & Plan:  45 yo with vulvovaginitis - Wet prep collected - Preventative measures discussed - patient will be contacted with abnormal results - RTC prn

## 2015-09-28 NOTE — Addendum Note (Signed)
Addended by: Gita KudoLASSITER, KRISTEN S on: 09/28/2015 04:09 PM   Modules accepted: Orders

## 2015-09-29 ENCOUNTER — Telehealth: Payer: Self-pay | Admitting: *Deleted

## 2015-09-29 LAB — WET PREP, GENITAL
Clue Cells Wet Prep HPF POC: NONE SEEN
Trich, Wet Prep: NONE SEEN
WBC, Wet Prep HPF POC: NONE SEEN
Yeast Wet Prep HPF POC: NONE SEEN

## 2015-09-29 NOTE — Telephone Encounter (Signed)
-----   Message from Catalina AntiguaPeggy Constant, MD sent at 09/29/2015  8:33 AM EDT ----- Please inform the patient of negative wet prep. She should continue vagisil cream for continued relief. No need for further medication  Thanks  Gigi Gineggy

## 2015-09-29 NOTE — Telephone Encounter (Signed)
Called pt to advise normal wet prep and continue vagisil cream - LMOM for pt to rtn call if needed.

## 2015-10-08 ENCOUNTER — Ambulatory Visit: Payer: Self-pay | Admitting: Obstetrics and Gynecology

## 2015-10-12 ENCOUNTER — Telehealth: Payer: Self-pay | Admitting: *Deleted

## 2015-10-12 DIAGNOSIS — N898 Other specified noninflammatory disorders of vagina: Secondary | ICD-10-CM

## 2015-10-12 MED ORDER — METRONIDAZOLE 500 MG PO TABS: 500.0000 mg | ORAL_TABLET | Freq: Two times a day (BID) | ORAL | Status: DC

## 2015-10-12 MED ORDER — FLUCONAZOLE 150 MG PO TABS: 150.0000 mg | ORAL_TABLET | Freq: Once | ORAL | Status: DC

## 2015-10-12 NOTE — Telephone Encounter (Signed)
-----   Message from Olevia BowensJacinda S Battle sent at 10/12/2015  4:20 PM EDT ----- Regarding: Advise/Rx Request Contact: [phone] Unresolved yeast/ last seen in office on 4/24/ wants to know what else she can do or take

## 2015-10-12 NOTE — Telephone Encounter (Signed)
Pt was seen in office a few weeks ago for vaginal irritation, instructed to continue vagisil at that time.  Pt still c/o symptoms of vaginal irritation.  Last wet prep WDL, but pt has hx recurrent BV as well.  Sent rx to pharmacy per protocol.  Instructed pt on medication use.  Pt has appt scheduled at the end of the month.  Will follow-up at that time.

## 2015-10-19 ENCOUNTER — Ambulatory Visit (HOSPITAL_COMMUNITY)
Admission: EM | Admit: 2015-10-19 | Discharge: 2015-10-19 | Disposition: A | Discharge status: Home / Self Care | Payer: BLUE CROSS/BLUE SHIELD | Attending: Family Medicine | Admitting: Family Medicine

## 2015-10-19 ENCOUNTER — Encounter (HOSPITAL_COMMUNITY): Payer: Self-pay | Admitting: Emergency Medicine

## 2015-10-19 DIAGNOSIS — N898 Other specified noninflammatory disorders of vagina: Secondary | ICD-10-CM

## 2015-10-19 NOTE — ED Notes (Signed)
Patient reports itchy, vaginal pain, burning, hurting.  Seen at pcp 3 weeks ago and treated with diflucan and flagyl and seemed to help.  As of 2 days ago, symptoms reoccurred.  Physician placed patient on diflucan and flagyl again.  Patient is not getting any relief this episode.  Patient reports no new sexual partner-patient-patient reports she has the same partner for 6 years.

## 2015-10-19 NOTE — ED Provider Notes (Signed)
CSN: [id number]     Arrival date & time 10/19/15  1546 History   First MD Initiated Contact with Patient 10/19/15 1721     Chief Complaint  Patient presents with  . Vaginal Pain   (Consider location/radiation/quality/duration/timing/severity/associated sxs/prior Treatment) HPI History obtained from patient:  Pt presents with the cc WU:[id number]:Vaginal pain and swelling Duration of symptoms: At least one week this time Treatment prior to arrival: Flagyl and Diflucan Context: Last month patient had similar symptoms was seen by her doctor and treated with Flagyl and Diflucan swab was obtained for bright bacterial vaginosis was negative. She states symptoms and returned at this time. She is not able to see her doctor until Wednesday. Other symptoms include: Painful to sit Pain score: Or FAMILY HISTORY: Mother with diabetes and hypertension SOCIAL HISTORY: Previous history of smoking  Past Medical History  Diagnosis Date  . Bipolar 1 disorder (HCC)   . Abnormal Pap smear     Unknown results>colpo>normal  . Anxiety    History reviewed. No pertinent past surgical history. Family History  Problem Relation Age of Onset  . Diabetes Mother   . Hypertension Mother   . Diabetes Maternal Grandmother   . Heart disease Maternal Grandmother   . Diabetes Maternal Grandfather   . Heart disease Maternal Grandfather    Social History  Substance Use Topics  . Smoking status: Former Smoker    Types: Cigarettes  . Smokeless tobacco: Never Used  . Alcohol Use: No   OB History    Gravida Para Term Preterm AB TAB SAB Ectopic Multiple Living   4 2 2  2  2   2      Review of Systems ROS +'ve vaginal pain, irritation   Denies: HEADACHE, NAUSEA, ABDOMINAL PAIN, CHEST PAIN, CONGESTION, DYSURIA, SHORTNESS OF BREATH  Allergies  Haldol and Tramadol  Home Medications   Prior to Admission medications   Medication Sig Start Date End Date Taking? Authorizing Provider  benztropine (COGENTIN) 0.5 MG  tablet Take 0.5 mg by mouth 2 (two) times daily.    Historical Provider, MD  buPROPion (WELLBUTRIN XL) 150 MG 24 hr tablet Take 150 mg by mouth daily.    Historical Provider, MD  Cyanocobalamin (VITAMIN B-12 PO) Take 1 tablet by mouth daily.    Historical Provider, MD  fluconazole (DIFLUCAN) 150 MG tablet Take 1 tablet (150 mg total) by mouth once. Can take additional dose three days later if symptoms persist 10/12/15   Reva Boresanya S Pratt, MD  LORazepam (ATIVAN) 0.5 MG tablet  09/27/13   Historical Provider, MD  Melatonin 5 MG TABS Take 1 tablet by mouth at bedtime as needed. For sleep    Historical Provider, MD  metroNIDAZOLE (FLAGYL) 500 MG tablet Take 1 tablet (500 mg total) by mouth 2 (two) times daily. 10/12/15   Reva Boresanya S Pratt, MD  Multiple Vitamins-Calcium (ONE-A-DAY WOMENS FORMULA PO) Take 1 tablet by mouth daily.    Historical Provider, MD  norgestimate-ethinyl estradiol (ORTHO-CYCLEN,SPRINTEC,PREVIFEM) 0.25-35 MG-MCG tablet Take 1 tablet by mouth daily. 02/18/15   Peggy Constant, MD  ziprasidone (GEODON) 80 MG capsule Take 80 mg by mouth 2 (two) times daily with a meal.    Historical Provider, MD   Meds Ordered and Administered this Visit  Medications - No data to display  BP 126/78 mmHg  Pulse 88  Temp(Src) 98.9 F (37.2 C) (Oral)  Resp 16  SpO2 99%  LMP 10/13/2015 No data found.   Physical Exam NURSES NOTES AND VITAL SIGNS REVIEWED.  CONSTITUTIONAL: Well developed, well nourished, no acute distress HEENT: normocephalic, atraumatic EYES: Conjunctiva normal NECK:normal ROM, supple, no adenopathy PULMONARY:No respiratory distress, normal effort ABDOMINAL: Soft, ND, NT BS+, No CVAT MUSCULOSKELETAL: Normal ROM of all extremities,  SKIN: warm and dry without rash PSYCHIATRIC: Mood and affect, behavior are normal NURSES NOTES AND VITAL SIGNS REVIEWED. CONSTITUTIONAL: Well developed, well nourished, no acute distress HEENT: normocephalic, atraumatic, right and left TM's are normal EYES:  Conjunctiva normal NECK:normal ROM, supple, no adenopathy PULMONARY:No respiratory distress, normal effort Exam is performed with patient's permission Female nursing staff present to chaperone. Pt declines internal exam, as she is having one by her doctor Wednesday Perineum: clean, dry without lesions, no groin or inguinal Lymphadenopathy; urethra . No caruncle or prolapse noted. Labia are red, irritated appearance. No vaginal discharge. No speculum exam.      ED Course  Procedures (including critical care time)  Labs Review Labs Reviewed - No data to display  Imaging Review No results found.   Visual Acuity Review  Right Eye Distance:   Left Eye Distance:   Bilateral Distance:    Right Eye Near:   Left Eye Near:    Bilateral Near:         MDM   1. Vaginal irritation     Patient is reassured that there are no issues that require transfer to higher level of care at this time or additional tests. Patient is advised to continue home symptomatic treatment. Patient is advised that if there are new or worsening symptoms to attend the emergency department, contact primary care provider, or return to UC. Instructions of care provided discharged home in stable condition.    THIS NOTE WAS GENERATED USING A VOICE RECOGNITION SOFTWARE PROGRAM. ALL REASONABLE EFFORTS  WERE MADE TO PROOFREAD THIS DOCUMENT FOR ACCURACY.  I have verbally reviewed the discharge instructions with the patient. A printed AVS was given to the patient.  All questions were answered prior to discharge.      Tharon Aquas, PA 10/19/15 1755

## 2015-10-19 NOTE — Discharge Instructions (Signed)
There is no abnormal lesions noted on the outer part of your vagina.  The labia of your vagina are red and appears very irritated.  As discussed this may be coming from your underwear.  Suggest trying hydrocortisone cream for the next couple of days and keep appointment with your primary care provider for Wednesday.

## 2015-10-19 NOTE — ED Notes (Signed)
Patient changing into gown 

## 2015-10-21 ENCOUNTER — Encounter: Payer: Self-pay | Admitting: Obstetrics & Gynecology

## 2015-10-21 ENCOUNTER — Ambulatory Visit (INDEPENDENT_AMBULATORY_CARE_PROVIDER_SITE_OTHER): Payer: BLUE CROSS/BLUE SHIELD | Admitting: Obstetrics & Gynecology

## 2015-10-21 VITALS — BP 115/76 | HR 81 | Ht 64.0 in | Wt 135.4 lb

## 2015-10-21 DIAGNOSIS — IMO0002 Reserved for concepts with insufficient information to code with codable children: Secondary | ICD-10-CM

## 2015-10-21 DIAGNOSIS — R87612 Low grade squamous intraepithelial lesion on cytologic smear of cervix (LGSIL): Secondary | ICD-10-CM

## 2015-10-21 DIAGNOSIS — B3731 Acute candidiasis of vulva and vagina: Secondary | ICD-10-CM

## 2015-10-21 DIAGNOSIS — Z793 Long term (current) use of hormonal contraceptives: Secondary | ICD-10-CM

## 2015-10-21 DIAGNOSIS — Z124 Encounter for screening for malignant neoplasm of cervix: Secondary | ICD-10-CM | POA: Diagnosis not present

## 2015-10-21 DIAGNOSIS — B373 Candidiasis of vulva and vagina: Secondary | ICD-10-CM | POA: Diagnosis not present

## 2015-10-21 DIAGNOSIS — Z1151 Encounter for screening for human papillomavirus (HPV): Secondary | ICD-10-CM | POA: Diagnosis not present

## 2015-10-21 MED ORDER — FLUCONAZOLE 150 MG PO TABS: 150.0000 mg | ORAL_TABLET | Freq: Once | ORAL | Status: DC

## 2015-10-21 NOTE — Progress Notes (Signed)
Patient ID: Kelly Carr, female   DOB: July 13, 1970, 45 y.o.   MRN: [id number] History:  45 y.o. [id number] here today for repeat PAP.  S/p colpo and bx.  C/o vaginal itching.  Took flagyl and diflucan with no relief.   The following portions of the patient's history were reviewed and updated as appropriate: allergies, current medications, past family history, past medical history, past social history, past surgical history and problem list.  Review of Systems:  Pertinent items are noted in HPI.  Objective:  Physical Exam Blood pressure 115/76, pulse 81, height 5\' 4"  (1.626 m), weight 135 lb 6.4 oz (61.417 kg), last menstrual period 10/11/2015. Gen: NAD Abd: Soft, nontender and nondistended Pelvic: Ext genitalia beefy red with satellite lesions.; normal appearing vaginal mucosa and cervix.   Discharge- thick and white.    Labs and Imaging Diagnosis 02/27/2016  LOW GRADE SQUAMOUS INTRAEPITHELIAL LESION: CIN-1/ HPV (LSIL). Pecola LeisureJOSHUA KISH MD Pathologist, Electronic Signature (Case signed 03/04/2015) Specimen Clinical Information HPV 16/18 WILL BE PERFORMED IF HR HPV IS DETECTED Source CervicoVaginal Pap [ThinPrep Imaged] Molecular Results Test Result HPV 16,18/45 Genotyping NEGATIVE FOR HPV 16 & 18/45 HPV High Risk DETECTED  03/24/2015 Diagnosis 1. 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 2. Endocervix, curettage - BENIGN ENDOCERVICAL MUCOSA Assessment & Plan:  F/u abnormal PAP  Discussed with pt HPV and relationship to cervical cancer and transmissibility  OCPs- pt missed doses.  Not sexually active for 2 months.  Restart in 4 days. Use backup condoms for 2 weeks  Vulvovaginitis appears to be yeast clinically.  F/u Affirm  Diflucan 150mg  po q 2 days x3.  F/u in 6 months for PAP  Kelly Carr, M.D., Evern CoreFACOG

## 2015-10-21 NOTE — Patient Instructions (Addendum)
Human Papillomavirus Human papillomavirus (HPV) is the most common sexually transmitted infection (STI) and is highly contagious. HPV infections cause genital warts and cancers to the outlet of the womb (cervix), birth canal (vagina), opening of the birth canal (vulva), and anus. There are over 100 types of HPV. Unless wartlike lesions are present in the throat or there are genital warts that you can see or feel, HPV usually does not cause symptoms. It is possible to be infected for long periods and pass it on to others without knowing it. CAUSES  HPV is spread from person to person through sexual contact. This includes oral, vaginal, or anal sex. RISK FACTORS  Having unprotected sex. HPV can be spread by oral, vaginal, or anal sex.  Having several sex partners.  Having a sex partner who has other sex partners.  Having or having had another sexually transmitted infection. SIGNS AND SYMPTOMS  Most people carrying HPV do not have any symptoms. If symptoms are present, symptoms may include:  Wartlike lesions in the throat (from having oral sex).  Warts in the infected skin or mucous membranes.  Genital warts that may itch, burn, or bleed.  Genital warts that may be painful or bleed during sexual intercourse. DIAGNOSIS  If wartlike lesions are present in the throat or genital warts are present, your health care provider can usually diagnose HPV by physical examination.   Genital warts are easily seen with the naked eye.  Currently, there is no FDA-approved test to detect HPV in males.  In females, a Pap test can show cells that are infected with HPV.  In females, a scope can be used to view the cervix (colposcopy). A colposcopy can be performed if the pelvic exam or Pap test is abnormal. A sample of tissue may be removed (biopsy) during the colposcopy. TREATMENT  There is no treatment for the virus itself. However, there are treatments for the health problems and symptoms HPV can cause.  Your health care provider will follow you closely after you are treated. This is because the HPV can come back and may need treatment again. Treatment of HPV may include:   Medicines, which may be injected or applied in a cream, lotion, or gel form.  Use of a probe to apply extreme cold (cryotherapy).  Application of an intense beam of light (laser treatment).  Use of a probe to apply extreme heat (electrocautery).  Surgery. HOME CARE INSTRUCTIONS   Take medicines only as directed by your health care provider.  Use over-the-counter creams for itching or irritation as directed by your health care provider.  Keep all follow-up visits as directed by your health care provider. This is important.  Do not touch or scratch the warts.  Do not treat genital warts with medicines used for treating hand warts.  Do not have sex while you are being treated.  Do not douche or use tampons during treatment of HPV.  Tell your sex partner about your infection because he or she may also need treatment.  If you become pregnant, tell your health care provider that you have had HPV. Your health care provider will monitor you closely during pregnancy to be sure your baby is safe.  After treatment, use condoms during sex to prevent future infections.  Have only one sex partner.  Have a sex partner who does not have other sex partners. PREVENTION   Talk to your health care provider about getting the HPV vaccines. These vaccines prevent some HPV infections and cancers.   It is recommended that the vaccine be given to males and females between the ages of 54 and 51 years old. It will not work if you already have HPV, and it is not recommended for pregnant women.  A Pap test is done to screen for cervical cancer in women.  The first Pap test should be done at age 54 years.  Between ages 79 and 34 years, Pap tests are repeated every 2 years.  Beginning at age 51, you are advised to have a Pap test every  3 years as long as your past 3 Pap tests have been normal.  Some women have medical problems that increase the chance of getting cervical cancer. Talk to your health care provider about these problems. It is especially important to talk to your health care provider if a new problem develops soon after your last Pap test. In these cases, your health care provider may recommend more frequent screening and Pap tests.  The above recommendations are the same for women who have or have not gotten the vaccine for HPV.  If you had a hysterectomy for a problem that was not a cancer or a condition that could lead to cancer, then you no longer need Pap tests. However, even if you no longer need a Pap test, a regular exam is a good idea to make sure no other problems are starting.   If you are between the ages of 51 and 51 years and you have had normal Pap tests going back 10 years, you no longer need Pap tests. However, even if you no longer need a Pap test, a regular exam is a good idea to make sure no other problems are starting.  If you have had past treatment for cervical cancer or a condition that could lead to cancer, you need Pap tests and screening for cancer for at least 20 years after your treatment.  If Pap tests have been discontinued, risk factors (such as a new sexual partner)need to be reassessed to determine if screening should be resumed.  Some women may need screenings more often if they are at high risk for cervical cancer. SEEK MEDICAL CARE IF:   The treated skin becomes red, swollen, or painful.  You have a fever.  You feel generally ill.  You feel lumps or pimple-like projections in and around your genital area.  You develop bleeding of the vagina or the treatment area.  You have painful sexual intercourse. MAKE SURE YOU:   Understand these instructions.  Will watch your condition.  Will get help if you are not doing well or get worse.   This information is not  intended to replace advice given to you by your health care provider. Make sure you discuss any questions you have with your health care provider.   Document Released: 08/13/2003 Document Revised: 06/13/2014 Document Reviewed: 08/28/2013 Elsevier Interactive Patient Education 2016 Elsevier Inc. Oral Contraception Use Oral contraceptive pills (OCPs) are medicines taken to prevent pregnancy. OCPs work by preventing the ovaries from releasing eggs. The hormones in OCPs also cause the cervical mucus to thicken, preventing the sperm from entering the uterus. The hormones also cause the uterine lining to become thin, not allowing a fertilized egg to attach to the inside of the uterus. OCPs are highly effective when taken exactly as prescribed. However, OCPs do not prevent sexually transmitted diseases (STDs). Safe sex practices, such as using condoms along with an OCP, can help prevent STDs. Before taking OCPs, you may  have a physical exam and Pap test. Your health care provider may also order blood tests if necessary. Your health care provider will make sure you are a good candidate for oral contraception. Discuss with your health care provider the possible side effects of the OCP you may be prescribed. When starting an OCP, it can take 2 to 3 months for the body to adjust to the changes in hormone levels in your body.  HOW TO TAKE ORAL CONTRACEPTIVE PILLS Your health care provider may advise you on how to start taking the first cycle of OCPs. Otherwise, you can:   Start on day 1 of your menstrual period. You will not need any backup contraceptive protection with this start time.   Start on the first Sunday after your menstrual period or the day you get your prescription. In these cases, you will need to use backup contraceptive protection for the first week.   Start the pill at any time of your cycle. If you take the pill within 5 days of the start of your period, you are protected against pregnancy  right away. In this case, you will not need a backup form of birth control. If you start at any other time of your menstrual cycle, you will need to use another form of birth control for 7 days. If your OCP is the type called a minipill, it will protect you from pregnancy after taking it for 2 days (48 hours). After you have started taking OCPs:   If you forget to take 1 pill, take it as soon as you remember. Take the next pill at the regular time.   If you miss 2 or more pills, call your health care provider because different pills have different instructions for missed doses. Use backup birth control until your next menstrual period starts.   If you use a 28-day pack that contains inactive pills and you miss 1 of the last 7 pills (pills with no hormones), it will not matter. Throw away the rest of the non-hormone pills and start a new pill pack.  No matter which day you start the OCP, you will always start a new pack on that same day of the week. Have an extra pack of OCPs and a backup contraceptive method available in case you miss some pills or lose your OCP pack.  HOME CARE INSTRUCTIONS   Do not smoke.   Always use a condom to protect against STDs. OCPs do not protect against STDs.   Use a calendar to mark your menstrual period days.   Read the information and directions that came with your OCP. Talk to your health care provider if you have questions.  SEEK MEDICAL CARE IF:   You develop nausea and vomiting.   You have abnormal vaginal discharge or bleeding.   You develop a rash.   You miss your menstrual period.   You are losing your hair.   You need treatment for mood swings or depression.   You get dizzy when taking the OCP.   You develop acne from taking the OCP.   You become pregnant.  SEEK IMMEDIATE MEDICAL CARE IF:   You develop chest pain.   You develop shortness of breath.   You have an uncontrolled or severe headache.   You develop  numbness or slurred speech.   You develop visual problems.   You develop pain, redness, and swelling in the legs.    This information is not intended to replace advice given  to you by your health care provider. Make sure you discuss any questions you have with your health care provider.   Document Released: 05/12/2011 Document Revised: 06/13/2014 Document Reviewed: 11/11/2012 Elsevier Interactive Patient Education [employer]

## 2015-10-22 LAB — WET PREP BY MOLECULAR PROBE
Candida species: NEGATIVE
Gardnerella vaginalis: NEGATIVE
Trichomonas vaginosis: NEGATIVE

## 2015-10-23 LAB — CYTOLOGY - PAP

## 2015-10-26 ENCOUNTER — Telehealth: Payer: Self-pay | Admitting: *Deleted

## 2015-10-26 NOTE — Telephone Encounter (Signed)
-----   Message from Carolyn Harraway-Smith, MD sent at 10/26/2015 10:54 AM EDT ----- Please call pt.  She needs a repeat PAP in 6 months as discussed.  Her PAP was neg with a +hrHPV  Thx, clh-S  

## 2015-10-26 NOTE — Telephone Encounter (Signed)
-----   Message from Willodean Rosenthalarolyn Harraway-Smith, MD sent at 10/26/2015 10:54 AM EDT ----- Please call pt.  She needs a repeat PAP in 6 months as discussed.  Her PAP was neg with a +hrHPV  Thx, clh-S

## 2015-10-26 NOTE — Telephone Encounter (Signed)
Called pt, no answer, left message to call the office.  

## 2015-10-26 NOTE — Telephone Encounter (Signed)
Informed pt of result and recommendation for rpt pap smear in 6 months.  Rhea BleacherJacinda will call her in a few months to schedule appt.  Pt acknowleged instructions.

## 2015-10-28 ENCOUNTER — Ambulatory Visit: Payer: Self-pay | Admitting: Obstetrics and Gynecology

## 2015-11-10 ENCOUNTER — Ambulatory Visit (INDEPENDENT_AMBULATORY_CARE_PROVIDER_SITE_OTHER): Payer: BLUE CROSS/BLUE SHIELD | Admitting: Family Medicine

## 2015-11-10 ENCOUNTER — Encounter: Payer: Self-pay | Admitting: Family Medicine

## 2015-11-10 VITALS — BP 105/61 | HR 79 | Temp 98.3°F | Ht 64.0 in | Wt 134.0 lb

## 2015-11-10 DIAGNOSIS — E785 Hyperlipidemia, unspecified: Secondary | ICD-10-CM | POA: Diagnosis not present

## 2015-11-10 DIAGNOSIS — M25551 Pain in right hip: Secondary | ICD-10-CM

## 2015-11-10 NOTE — Patient Instructions (Signed)
Thank you for coming in,   I can make a referral to sports medicine.   Fish oil, regular exercise, fiber and eating well can help with your cholesterol.   Please bring all of your medications with you to each visit.   Health maintenance items that are due.  Health Maintenance  Topic Date Due  . HIV Screening  03/19/1988  . Flu Shot  01/05/2016  . Pap Smear  10/21/2018  . Tetanus Vaccine  01/08/2019     Sign up for My Chart to have easy access to your labs results, and communication with your Primary care physician   Please feel free to call with any questions or concerns at any time, at [phone]. --Dr. Jordan LikesSchmitz

## 2015-11-10 NOTE — Progress Notes (Signed)
Subjective:    Kelly Carr - 44 y.o. female MRN [id number]  Date of birth: 04/30/1971  CC right hip pain   HPI  Kelly Carr is here for right hip pain.  Right hip pain:  Pain has not improved since the last time that she was here.  She places her whole hand on her right buttock/lateral hip where she describes the pain.  Pain is not a specific point but general  She feels like her hip needs to "pop."  Unable to sleep with her right hip abducted since it causes pain  She is unable to sleep on her right side at night.  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  Denies any radiculopathy or prior injury.   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  Has little free time since she is working two jobs so hasn't been able to complete PT.   HYPERLIPIDEMIA Her lipid panel collected by her psychiatrist showed an LDL of 159 and total cholesterol of 231 Reports she has been on a statin previously.  Symptoms Chest pain on exertion:  no   . Leg claudication:   no Medications: n/a  Compliance- n/a  Right upper quadrant pain- no   Muscle aches- no     Component Value Date/Time   CHOL 195 05/04/2015 1120   TRIG 103 05/04/2015 1120   HDL 47 05/04/2015 1120   VLDL 21 05/04/2015 1120   CHOLHDL 4.1 05/04/2015 1120     PMH: bipolar d/o, normocytic anemia,  SH: former smoker  FH: HTN, DM2  Health Maintenance:  Health Maintenance Due  Topic Date Due  . HIV Screening  03/19/1988    Review of Systems See HPI     Objective:   Physical Exam BP 105/61 mmHg  Pulse 79  Temp(Src) 98.3 F (36.8 C) (Oral)  Ht 5\' 4"  (1.626 m)  Wt 134 lb (60.782 kg)  BMI 22.99 kg/m2  LMP 11/10/2015 Gen: NAD, alert, cooperative with exam, well-appearing CV: RRR, good S1/S2, no murmur, no edema Skin: no rashes, normal turgor   Psych: good insight, alert and oriented Hip:  Appearance: mild positive Trendelenburg on right    Palpation: no pain with palpation over greater trochanter or IT band  Rotation Reduced: normal internal and external  FADIR: positive on right  FABER: positive on right Neuro: Strength hip flexion 5/5, knee extension 5/5, knee flexion 5/5, dorsiflexion 5/5, plantar flexion 5/5 Reflexes: patella 2/2 Bilateral  Achilles 2/2 Bilateral Straight Leg Raise: negative Sensation to light touch intact: yes     Assessment & Plan:   Right hip pain Suspect that she has different origins of her pani.  There seems to be a component of SI joint dysfunction, gluteus medius weakness and possible greater trochanteric bursitis.  Doesn't appear to be associated with arthritis, piriformis or from her back  She doesn't have a lot of time to dedicate to PT which is inhibiting her from improving  - referral to Sports medicine for possible US vs gait analysis ( if she needs insoles since she works on her feet so much) vs injection of Greater trochanter vs SI joint vs hip  - ultimately she will need PT to resolve or improve her weakness   HLD (hyperlipidemia) Lab work from her psychiatrist scanned into chart She doesn't eat terrible and gets some exercise.  - she wants to try fish oil and fiber supplementation for now.  -  repeat an LDL direct in 6 months and is still elevated will need to start some form of statin therapy. Either daily or every other day or so on

## 2015-11-12 DIAGNOSIS — E785 Hyperlipidemia, unspecified: Secondary | ICD-10-CM | POA: Insufficient documentation

## 2015-11-12 NOTE — Assessment & Plan Note (Addendum)
Suspect that she has different origins of her pain There seems to be a component of SI joint dysfunction, gluteus medius weakness and possible greater trochanteric bursitis.  Doesn't appear to be associated with arthritis, piriformis or from her back  She doesn't have a lot of time to dedicate to PT which is inhibiting her from improving  - referral to Sports medicine for possible US vs gait analysis ( if she needs insoles since she works on her feet so much) vs injection of Greater trochanter vs SI joint vs hip  - ultimately she will need PT to resolve or improve her weakness

## 2015-11-12 NOTE — Assessment & Plan Note (Signed)
Lab work from her psychiatrist scanned into chart She doesn't eat terrible and gets some exercise.  - she wants to try fish oil and fiber supplementation for now.  - repeat an LDL direct in 6 months and is still elevated will need to start some form of statin therapy. Either daily or every other day or so on

## 2015-12-04 ENCOUNTER — Encounter: Payer: Self-pay | Admitting: Family Medicine

## 2015-12-04 ENCOUNTER — Ambulatory Visit (INDEPENDENT_AMBULATORY_CARE_PROVIDER_SITE_OTHER): Payer: BLUE CROSS/BLUE SHIELD | Admitting: Family Medicine

## 2015-12-04 VITALS — BP 110/60 | Ht 64.0 in | Wt 135.0 lb

## 2015-12-04 DIAGNOSIS — M25551 Pain in right hip: Secondary | ICD-10-CM | POA: Diagnosis not present

## 2015-12-04 MED ORDER — PREDNISONE 10 MG (21) PO TBPK: ORAL_TABLET | ORAL | Status: DC

## 2015-12-04 NOTE — Progress Notes (Signed)
Patient ID: Kelly Carr, female   DOB: [birth year], 45 y.o.   MRN: [id number]  Kelly Carr - 44 y.o. female MRN [id number]  Date of birth: [birth year]    SUBJECTIVE:     Chief Complaint: Right buttock, right hip and right upper leg pain for about the last 6-9 months.  HPI Over the last 6-9 much she's had increasing pain in the right posterior hip/buttock area. Sometimes it is also painful in the right upper thigh and right upper hamstring area. Is worse when she stands. 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 This is anything she can think of that may have aggravated her buttock and hip area. Pain is starting to limit her daily activities. She's not doing any of her sporting event such as soccer right now secondary to pain. Is also keeping her awake at night. Is not radiate past the knee.  ROS:     She's had no unusual weight change, fever, sweats, chills. She's had no other unusual arthralgias. She does feel like the leg is sometimes weak but has not given way. She's noted no unusual rash.  PERTINENT  PMH / PSH FH / / SH:  Past Medical, Surgical, Social, and Family History Reviewed & Updated in the EMR.  Pertinent findings include:  Hyperlipidemia, bipolar disorder, GERD.  OBJECTIVE: BP 110/60 mmHg  Ht 5\' 4"  (1.626 m)  Wt 135 lb (61.236 kg)  BMI 23.16 kg/m2  LMP 11/10/2015  Physical Exam:  Vital signs are reviewed. GEN.: Well-developed female no acute distress  HIPS: Internal/external rotation is full and without any pain. KNEES: Bilaterally symmetrical without any sign of effusion. No crepitus. Flexion and extension normal. BUTTOCK right : Diffusely tender to palpation all across the right gluteus maximus area and down into the proximal hamstring area. There is no specific area of defect or tenderness. She has pain in this area with resisted leg flexion and extension. FABER is painful. GAIT: Antalgic  with a limp on the right. SI JOINT pain with lateral compression is mild to moderate, worse on the right. NEURO: DTRs 2+ bilaterally equal at the knee. Straight leg raise is negative in both seated and supine positions up to 90 of flexion. After that she starts to have muscular pain but not neuropathic pain we'll proceed into further flexion at the hip  IMAGING: Pelvis and right hip x-rays from 05/04/2015 are reviewed. There is no evidence of significant arthropathy in the hip, the joint space is normal. There aresignificant osteophytes or cam and pincer noted. The SI joints look normal and symmetrical.  ASSESSMENT & PLAN:  See problem based charting & AVS for pt instructions.

## 2015-12-04 NOTE — Patient Instructions (Signed)
Steroid taper Days 1-2 take 6 pills each day Days 3-4 take 5 pills each day Days 5-6 take 4 pills each day Days 7-8 Take 3 pills each day Days 9-10 Take 2 pills each day Days 11-12 take one pill each day

## 2015-12-04 NOTE — Assessment & Plan Note (Signed)
This is more right buttock pain that is associated with the right hip joint. It's a bit confusing. I don't really think it's coming from her back. There could be a component of SI joint dysfunction. At this point she is limping quite a bit and has generalized pain and tenderness I'll treat her with a steroid taper and see her back. On the reexamination we'll decide if a ultrasound-guided sacroiliac joint injection might be beneficial. Potentially we can also consider imaging her lumbar spine do see if there is some neuropathic involvement although this seems fairly remote possibility at this time.

## 2015-12-09 ENCOUNTER — Emergency Department
Admission: EM | Admit: 2015-12-09 | Discharge: 2015-12-09 | Disposition: A | Discharge status: Home / Self Care | Payer: BLUE CROSS/BLUE SHIELD | Attending: Emergency Medicine | Admitting: Emergency Medicine

## 2015-12-09 ENCOUNTER — Encounter: Payer: Self-pay | Admitting: Emergency Medicine

## 2015-12-09 ENCOUNTER — Emergency Department: Payer: BLUE CROSS/BLUE SHIELD

## 2015-12-09 DIAGNOSIS — M791 Myalgia, unspecified site: Secondary | ICD-10-CM

## 2015-12-09 DIAGNOSIS — Z79899 Other long term (current) drug therapy: Secondary | ICD-10-CM | POA: Insufficient documentation

## 2015-12-09 DIAGNOSIS — F319 Bipolar disorder, unspecified: Secondary | ICD-10-CM | POA: Insufficient documentation

## 2015-12-09 DIAGNOSIS — Z87891 Personal history of nicotine dependence: Secondary | ICD-10-CM | POA: Diagnosis not present

## 2015-12-09 DIAGNOSIS — R0982 Postnasal drip: Secondary | ICD-10-CM | POA: Diagnosis not present

## 2015-12-09 DIAGNOSIS — R0981 Nasal congestion: Secondary | ICD-10-CM | POA: Diagnosis present

## 2015-12-09 DIAGNOSIS — J019 Acute sinusitis, unspecified: Secondary | ICD-10-CM

## 2015-12-09 LAB — MONONUCLEOSIS SCREEN: Mono Screen: NEGATIVE

## 2015-12-09 LAB — POCT RAPID STREP A: Streptococcus, Group A Screen (Direct): NEGATIVE

## 2015-12-09 MED ORDER — AMOXICILLIN-POT CLAVULANATE 875-125 MG PO TABS: 1.0000 | ORAL_TABLET | Freq: Two times a day (BID) | ORAL | Status: AC

## 2015-12-09 MED ORDER — OXYMETAZOLINE HCL 0.05 % NA SOLN
1.0000 | Freq: Once | NASAL | Status: AC
  Administered 2015-12-09: 1 via NASAL
  Filled 2015-12-09: qty 15

## 2015-12-09 MED ORDER — KETOROLAC TROMETHAMINE 60 MG/2ML IM SOLN
60.0000 mg | Freq: Once | INTRAMUSCULAR | Status: AC
  Administered 2015-12-09: 60 mg via INTRAMUSCULAR
  Filled 2015-12-09: qty 2

## 2015-12-09 NOTE — ED Notes (Signed)
Dr. Webster at bedside.  

## 2015-12-09 NOTE — ED Notes (Signed)
Pt transported to xray via stretcher

## 2015-12-09 NOTE — Discharge Instructions (Signed)
Sinusitis, Adult °Sinusitis is redness, soreness, and inflammation of the paranasal sinuses. Paranasal sinuses are air pockets within the bones of your face. They are located beneath your eyes, in the middle of your forehead, and above your eyes. In healthy paranasal sinuses, mucus is able to drain out, and air is able to circulate through them by way of your nose. However, when your paranasal sinuses are inflamed, mucus and air can become trapped. This can allow bacteria and other germs to grow and cause infection. °Sinusitis can develop quickly and last only a short time (acute) or continue over a long period (chronic). Sinusitis that lasts for more than 12 weeks is considered chronic. °CAUSES °Causes of sinusitis include: °· Allergies. °· Structural abnormalities, such as displacement of the cartilage that separates your nostrils (deviated septum), which can decrease the air flow through your nose and sinuses and affect sinus drainage. °· Functional abnormalities, such as when the small hairs (cilia) that line your sinuses and help remove mucus do not work properly or are not present. °SIGNS AND SYMPTOMS °Symptoms of acute and chronic sinusitis are the same. The primary symptoms are pain and pressure around the affected sinuses. Other symptoms include: °· Upper toothache. °· Earache. °· Headache. °· Bad breath. °· Decreased sense of smell and taste. °· A cough, which worsens when you are lying flat. °· Fatigue. °· Fever. °· Thick drainage from your nose, which often is green and may contain pus (purulent). °· Swelling and warmth over the affected sinuses. °DIAGNOSIS °Your health care provider will perform a physical exam. During your exam, your health care provider may perform any of the following to help determine if you have acute sinusitis or chronic sinusitis: °· Look in your nose for signs of abnormal growths in your nostrils (nasal polyps). °· Tap over the affected sinus to check for signs of  infection. °· View the inside of your sinuses using an imaging device that has a light attached (endoscope). °If your health care provider suspects that you have chronic sinusitis, one or more of the following tests may be recommended: °· Allergy tests. °· Nasal culture. A sample of mucus is taken from your nose, sent to a lab, and screened for bacteria. °· Nasal cytology. A sample of mucus is taken from your nose and examined by your health care provider to determine if your sinusitis is related to an allergy. °TREATMENT °Most cases of acute sinusitis are related to a viral infection and will resolve on their own within 10 days. Sometimes, medicines are prescribed to help relieve symptoms of both acute and chronic sinusitis. These may include pain medicines, decongestants, nasal steroid sprays, or saline sprays. °However, for sinusitis related to a bacterial infection, your health care provider will prescribe antibiotic medicines. These are medicines that will help kill the bacteria causing the infection. °Rarely, sinusitis is caused by a fungal infection. In these cases, your health care provider will prescribe antifungal medicine. °For some cases of chronic sinusitis, surgery is needed. Generally, these are cases in which sinusitis recurs more than 3 times per year, despite other treatments. °HOME CARE INSTRUCTIONS °· Drink plenty of water. Water helps thin the mucus so your sinuses can drain more easily. °· Use a humidifier. °· Inhale steam 3-4 times a day (for example, sit in the bathroom with the shower running). °· Apply a warm, moist washcloth to your face 3-4 times a day, or as directed by your health care provider. °· Use saline nasal sprays to help   moisten and clean your sinuses. °· Take medicines only as directed by your health care provider. °· If you were prescribed either an antibiotic or antifungal medicine, finish it all even if you start to feel better. °SEEK IMMEDIATE MEDICAL CARE IF: °· You have  increasing pain or severe headaches. °· You have nausea, vomiting, or drowsiness. °· You have swelling around your face. °· You have vision problems. °· You have a stiff neck. °· You have difficulty breathing. °  °This information is not intended to replace advice given to you by your health care provider. Make sure you discuss any questions you have with your health care provider. °  °Document Released: 05/23/2005 Document Revised: 06/13/2014 Document Reviewed: 06/07/2011 °Elsevier Interactive Patient Education ©2016 [employer] ° °Musculoskeletal Pain °Musculoskeletal pain is muscle and boney aches and pains. These pains can occur in any part of the body. Your caregiver may treat you without knowing the cause of the pain. They may treat you if blood or urine tests, X-rays, and other tests were normal.  °CAUSES °There is often not a definite cause or reason for these pains. These pains may be caused by a type of germ (virus). The discomfort may also come from overuse. Overuse includes working out too hard when your body is not fit. Boney aches also come from weather changes. Bone is sensitive to atmospheric pressure changes. °HOME CARE INSTRUCTIONS  °· Ask when your test results will be ready. Make sure you get your test results. °· Only take over-the-counter or prescription medicines for pain, discomfort, or fever as directed by your caregiver. If you were given medications for your condition, do not drive, operate machinery or power tools, or sign legal documents for 24 hours. Do not drink alcohol. Do not take sleeping pills or other medications that may interfere with treatment. °· Continue all activities unless the activities cause more pain. When the pain lessens, slowly resume normal activities. Gradually increase the intensity and duration of the activities or exercise. °· During periods of severe pain, bed rest may be helpful. Lay or sit in any position that is comfortable. °· Putting ice on the injured  area. °¨ Put ice in a bag. °¨ Place a towel between your skin and the bag. °¨ Leave the ice on for 15 to 20 minutes, 3 to 4 times a day. °· Follow up with your caregiver for continued problems and no reason can be found for the pain. If the pain becomes worse or does not go away, it may be necessary to repeat tests or do additional testing. Your caregiver may need to look further for a possible cause. °SEEK IMMEDIATE MEDICAL CARE IF: °· You have pain that is getting worse and is not relieved by medications. °· You develop chest pain that is associated with shortness or breath, sweating, feeling sick to your stomach (nauseous), or throw up (vomit). °· Your pain becomes localized to the abdomen. °· You develop any new symptoms that seem different or that concern you. °MAKE SURE YOU:  °· Understand these instructions. °· Will watch your condition. °· Will get help right away if you are not doing well or get worse. °  °This information is not intended to replace advice given to you by your health care provider. Make sure you discuss any questions you have with your health care provider. °  °Document Released: 05/23/2005 Document Revised: 08/15/2011 Document Reviewed: 01/25/2013 °Elsevier Interactive Patient Education ©2016 Elsevier Inc. ° °

## 2015-12-09 NOTE — ED Provider Notes (Signed)
[facility] Emergency Department Provider Note   ____________________________________________  Time seen: Approximately 2:00 AM  I have reviewed the triage vital signs and the nursing notes.   HISTORY  Chief Complaint Nasal Congestion; Facial Pain; and Neck Pain    HPI Kelly Carr is a 45 y.o. female who comes into the hospital with some neck pain chest pain and back pain. She reports that should have a some congestion yesterday and this seems to be moving into her neck and her back. She reports that it hurt today even just a touch her skin. She reports that she took some Aleve yesterday around lunchtime but has not taken anything since then. She reports that she has nothing at home to take. She did have some sore throat as well as cough and reports that she had a temperature to 100.5 last night. The patient endorses nasal congestion and some tenderness to her face. She has not brought anything at the store. She rates her pain an 8 out of 10 in intensity. She reports that she just couldn't tolerate the symptoms anymore so she decided to come into the hospital today.Patient denies any headache and denies any shortness of breath.   Past Medical History  Diagnosis Date  . Bipolar 1 disorder (HCC)   . Abnormal Pap smear     Unknown results>colpo>normal  . Anxiety     Patient Active Problem List   Diagnosis Date Noted  . HLD (hyperlipidemia) 11/12/2015  . Normocytic anemia 05/26/2015  . Pain of upper abdomen 05/04/2015  . 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 Right hip pain 12/27/2014  . Pharyngitis, chronic 08/11/2014  . Adjustment disorder with mixed anxiety and depressed mood 10/17/2013  . Irregular menses 07/17/2013  . GERD (gastroesophageal reflux disease) 11/10/2010  . Carpal tunnel syndrome of left wrist 11/10/2010  . Bipolar disorder (HCC) 11/25/2008  . OTH ABNORMAL PAPANICOLAOU SMEAR  CERVIX&CERV HPV 11/25/2008  . DEPRESSION 10/21/2008    History reviewed. No pertinent past surgical history.  Current Outpatient Rx  Name  Route  Sig  Dispense  Refill  . amoxicillin-clavulanate (AUGMENTIN) 875-125 MG tablet   Oral   Take 1 tablet by mouth 2 (two) times daily.   20 tablet   0   . benztropine (COGENTIN) 0.5 MG tablet   Oral   Take 0.5 mg by mouth 2 (two) times daily.         Marland Kitchen. buPROPion (WELLBUTRIN XL) 150 MG 24 hr tablet   Oral   Take 150 mg by mouth daily.         . busPIRone (BUSPAR) 15 MG tablet   Oral   Take 7.5 mg by mouth 2 (two) times daily.         . Cyanocobalamin (VITAMIN B-12 PO)   Oral   Take 1 tablet by mouth daily.         Marland Kitchen. LORazepam (ATIVAN) 0.5 MG tablet               . Melatonin 5 MG TABS   Oral   Take 1 tablet by mouth at bedtime as needed. For sleep         . Multiple Vitamins-Calcium (ONE-A-DAY WOMENS FORMULA PO)   Oral   Take 1 tablet by mouth daily.         . norgestimate-ethinyl estradiol (ORTHO-CYCLEN,SPRINTEC,PREVIFEM) 0.25-35 MG-MCG tablet   Oral   Take 1 tablet by mouth daily.   1  Package   11   . predniSONE (STERAPRED UNI-PAK 21 TAB) 10 MG (21) TBPK tablet      Patient has written taper take by mouth   42 tablet   0   . traZODone (DESYREL) 50 MG tablet   Oral   Take 25 mg by mouth at bedtime.         . ziprasidone (GEODON) 80 MG capsule   Oral   Take 80 mg by mouth 2 (two) times daily with a meal.           Allergies Haldol and Tramadol  Family History  Problem Relation Age of Onset  . Diabetes Mother   . Hypertension Mother   . Diabetes Maternal Grandmother   . Heart disease Maternal Grandmother   . Diabetes Maternal Grandfather   . Heart disease Maternal Grandfather     Social History Social History  Substance Use Topics  . Smoking status: Former Smoker    Types: Cigarettes  . Smokeless tobacco: Never Used  . Alcohol Use: No    Review of Systems Constitutional:   fever/chills Eyes: No visual changes. ENT:  sore throat. Cardiovascular: Denies chest pain. Respiratory: Denies shortness of breath. Gastrointestinal: No abdominal pain.  No nausea, no vomiting.  No diarrhea.  No constipation. Genitourinary: Negative for dysuria. Musculoskeletal: Neck pain, back pain Skin: Negative for rash. Neurological: Negative for headaches, focal weakness or numbness.  10-point ROS otherwise negative.  ____________________________________________   PHYSICAL EXAM:  VITAL SIGNS: ED Triage Vitals  Enc Vitals Group     BP 12/09/15 0120 110/75 mmHg     Pulse Rate 12/09/15 0120 83     Resp 12/09/15 0120 18     Temp 12/09/15 0120 97.4 F (36.3 C)     Temp Source 12/09/15 0120 Oral     SpO2 12/09/15 0120 99 %     Weight 12/09/15 0120 135 lb (61.236 kg)     Height 12/09/15 0120  (1.626 m)     Head Cir --      Peak Flow --      Pain Score 12/09/15 0121 8     Pain Loc --      Pain Edu? --      Excl. in GC? --     Constitutional: Alert and oriented. Well appearing and in mild distress. Eyes: Conjunctivae are normal. PERRL. EOMI. Head: Atraumatic. Maxillary and ethmoid sinus tenderness to palpation Nose: No congestion/rhinnorhea. Mouth/Throat: Mucous membranes are moist.  Oropharynx non-erythematous. Neck: No cervical spine tenderness to palpation. Supple Hematological/Lymphatic/Immunilogical: Left cervical lymphadenopathy. Cardiovascular: Normal rate, regular rhythm. Grossly normal heart sounds.  Good peripheral circulation. Respiratory: Normal respiratory effort.  No retractions. Lungs CTAB. Gastrointestinal: Soft and nontender. No distention. Positive bowel sounds Musculoskeletal: No lower extremity tenderness nor edema.  . Neurologic:  Normal speech and language.  Skin:  Skin is warm, dry and intact.  Psychiatric: Mood and affect are normal.   ____________________________________________   LABS (all labs ordered are listed, but only abnormal  results are displayed)  Labs Reviewed  MONONUCLEOSIS SCREEN  POCT RAPID STREP A   ____________________________________________  EKG  none ____________________________________________  RADIOLOGY  CXR: No acute pulmonary process ____________________________________________   PROCEDURES  Procedure(s) performed: None  Procedures  Critical Care performed: No  ____________________________________________   INITIAL IMPRESSION / ASSESSMENT AND PLAN / ED COURSE  Pertinent labs & imaging results that were available during my care of the patient were reviewed by me and considered in my medical  decision making (see chart for details).  This is a 45 year old female who comes in with some neck pain and body aches as well as some nasal congestion and sore throat. The patient is also had a low-grade temperature last night. I will check a strep test, chest x-ray and the Monospot. The patient receive a dose of Toradol as well as some Afrin for her nasal congestion.  The patient reports that she does feel little bit improved. She will be discharged to home with some augmentin for sinusitis. She should follow up with her primary care physician.  ____________________________________________   FINAL CLINICAL IMPRESSION(S) / ED DIAGNOSES  Final diagnoses:  Acute sinusitis, recurrence not specified, unspecified location  Post-nasal drip  Myalgia      NEW MEDICATIONS STARTED DURING THIS VISIT:  Discharge Medication List as of 12/09/2015  4:21 AM    START taking these medications   Details  amoxicillin-clavulanate (AUGMENTIN) 875-125 MG tablet Take 1 tablet by mouth 2 (two) times daily., Starting 12/09/2015, Until Sat 12/19/15, Print         Note:  This document was prepared using Dragon voice recognition software and may include unintentional dictation errors.    Rebecka ApleyAllison P Jamae Tison, MD 12/09/15 0530

## 2015-12-09 NOTE — ED Notes (Signed)
Pt ambulatory to triage with c/o neck pain (8/10), nasal congestion and pressure in face worse with coughing, symptoms for the last 2 days, pt also c/o being sore to the touch in the chest.  Pt reports taking aleve last at 12pm yesterday.    Pt reports taking prednisone since Friday for right hip pain.

## 2015-12-18 ENCOUNTER — Ambulatory Visit: Payer: BLUE CROSS/BLUE SHIELD | Admitting: Family Medicine

## 2016-01-26 ENCOUNTER — Encounter: Payer: Self-pay | Admitting: Internal Medicine

## 2016-01-26 ENCOUNTER — Ambulatory Visit (INDEPENDENT_AMBULATORY_CARE_PROVIDER_SITE_OTHER): Payer: BLUE CROSS/BLUE SHIELD | Admitting: Internal Medicine

## 2016-01-26 VITALS — BP 106/69 | HR 91 | Temp 98.2°F | Ht 64.0 in | Wt 139.6 lb

## 2016-01-26 DIAGNOSIS — R300 Dysuria: Secondary | ICD-10-CM | POA: Diagnosis not present

## 2016-01-26 LAB — POCT URINALYSIS DIPSTICK
Bilirubin, UA: NEGATIVE
Glucose, UA: NEGATIVE
Ketones, UA: NEGATIVE
Nitrite, UA: NEGATIVE
Protein, UA: 30
Spec Grav, UA: 1.02
Urobilinogen, UA: 0.2
pH, UA: 6.5

## 2016-01-26 LAB — POCT UA - MICROSCOPIC ONLY

## 2016-01-26 MED ORDER — CEPHALEXIN 250 MG PO CAPS: 250.0000 mg | ORAL_CAPSULE | Freq: Four times a day (QID) | ORAL | 0 refills | Status: DC

## 2016-01-26 MED ORDER — PHENAZOPYRIDINE HCL 200 MG PO TABS: ORAL_TABLET | ORAL | 0 refills | Status: DC

## 2016-01-26 NOTE — Progress Notes (Signed)
URI.

## 2016-01-26 NOTE — Progress Notes (Signed)
[facility] Progress Note  Subjective:  Kelly Carr is a 44-y/o female who presents for SDA for pain with urination.  Dysuria: - Began about 3 days ago - Has had increased frequency and urge with small volumes of urine - Denies abdominal pain - No increased vaginal discharge - Had dysuria at the end of last year (8 months ago) and was treated with CTX and azithromycin for concern for PID due to multiple sexual partners, but GC/chlamydia tests resulted as negative ROS: No fevers or chills, no n/v/d, no decreased appetite or emesis, no back or flank pain; has been having mild headaches  Social: Former smoker  Objective: Blood pressure 106/69, pulse 91, temperature 98.2 F (36.8 C), temperature source Oral, height 5\' 4"  (1.626 m), weight 139 lb 9.6 oz (63.3 kg). Constitutional: Well-appearing female in NAD  Abdominal: Soft. +BS, mild lower abdominal TTP, ND, no rebound or guarding.  Musculoskeletal: No CVA tenderness Vitals reviewed  Assessment/Plan: Dysuria - Symptoms consistent with UTI. Large leuks seen on UA but negative nitrites, TNTC WBCs and 1-5 RBCs seen on microscopic add-on.  - Prescribed keflex 250 mg q6h for uncomplicated cystitis and pyridium 200 mg TID x 2 days to help with symptoms - Ordered urine culture; will call patient if not sensitive to keflex - Gave strict return precautions  Follow-up if symptoms do not improve.  Dani GobbleHillary Fitzgerald, MD [facility], PGY-2

## 2016-01-26 NOTE — Patient Instructions (Signed)

## 2016-01-27 NOTE — Assessment & Plan Note (Signed)
-   Symptoms consistent with UTI. Large leuks seen on UA but negative nitrites, TNTC WBCs and 1-5 RBCs seen on microscopic add-on.  - Prescribed keflex 250 mg q6h for uncomplicated cystitis and pyridium 200 mg TID x 2 days to help with symptoms - Ordered urine culture; will call patient if not sensitive to keflex - Gave strict return precautions

## 2016-01-29 LAB — URINE CULTURE: Colony Count: >=100000

## 2016-02-17 ENCOUNTER — Encounter: Payer: Self-pay | Admitting: Family Medicine

## 2016-02-17 ENCOUNTER — Ambulatory Visit (INDEPENDENT_AMBULATORY_CARE_PROVIDER_SITE_OTHER): Payer: BLUE CROSS/BLUE SHIELD | Admitting: Family Medicine

## 2016-02-17 VITALS — BP 112/66 | HR 88 | Temp 98.5°F | Wt 140.0 lb

## 2016-02-17 DIAGNOSIS — S4991XA Unspecified injury of right shoulder and upper arm, initial encounter: Secondary | ICD-10-CM

## 2016-02-17 MED ORDER — CYCLOBENZAPRINE HCL 5 MG PO TABS: 5.0000 mg | ORAL_TABLET | Freq: Three times a day (TID) | ORAL | 0 refills | Status: DC | PRN

## 2016-02-17 MED ORDER — HYDROCODONE-ACETAMINOPHEN 5-325 MG PO TABS: 1.0000 | ORAL_TABLET | Freq: Three times a day (TID) | ORAL | 0 refills | Status: DC | PRN

## 2016-02-17 NOTE — Assessment & Plan Note (Addendum)
Patient is here after suffering a right shoulder injury. Mechanism of injury and physical exam is concerning for possible rotator cuff tear. Labral tear deemed unlikely. No AC joint pain suggesting separation. No clavicular pain suggesting fracture. Cannot rule out humeral head fracture/Bankart/Hill-Sachs lesion. Discussed the possibility of conservative management versus surgical intervention if this is the true diagnosis. Patient states that she would most likely want surgical repair if there is a tear. - Referral to orthopedic surgery. - Right shoulder arthrogram/MRI ordered. - Encouraged ice. - Encouraged anti-inflammatories. - Norco 21 tablets for short-term pain relief. - Flexeril 30 tablets for spasms. - Strongly encouraged continuing passive range of motion of the injured shoulder as to prevent possible frozen shoulder.

## 2016-02-17 NOTE — Patient Instructions (Signed)
It was a pleasure seeing you today in our clinic. Today we discussed your right shoulder injury. Here is the treatment plan we have discussed and agreed upon together:   - Ice your shoulder regularly. - Take Tylenol to help with pain - I placed a referral to orthopedic surgery. You'll be contacted to schedule an appointment. - Before you leave here today will give you the appointment time of your MRI.

## 2016-02-17 NOTE — Progress Notes (Signed)
HPI  CC: Right shoulder injury Patient is here after sustaining a injury to her right shoulder. She states that 2 days ago she was at [employer] when she tripped over an item in the aisle. She fell onto her right side with her arm extended. She denies any feelings or sensations of popping or giving way. Pain was immediate, but has since gotten significantly worse. Pain is localized to the anterior lateral aspect of the right shoulder below the edge of the acromion. Pain does not radiate. Patient endorses limited range of motion but states that this is likely due to pain.  Patient has never experienced any shoulder injuries in the past. She denies any fever, chills, nausea, vomiting, numbness, or paresthesias.  Review of Systems   See HPI for ROS. All other systems reviewed and are negative.  CC, SH/smoking status, and VS noted  Objective: BP 112/66 (BP Location: Left Arm, Patient Position: Sitting, Cuff Size: Normal)   Pulse 88   Temp 98.5 F (36.9 C) (Oral)   Wt 140 lb (63.5 kg)   BMI 24.03 kg/m  Gen: NAD, alert, cooperative. Tearful with mobilization of the right arm. CV: Well-perfused. Resp: Non-labored. Neuro: Sensation intact throughout. Shoulder, Right: Inspection reveals positional asymmetry right-sided shoulder protraction and depression compared to the left. Otherwise no obvious abnormalities, atrophy or asymmetry. Significant tenderness to palpation over the lateral aspect of the shoulder below the acromion. No pain over the bicipital groove or AC joint. Range of motion significantly limited secondary to pain. Forward flexion and abduction to about 60 (pain more significant with thumb turned down). External rotation intact. Internal rotation to SI joint. Passive ROM difficult to assess due to pain but no solid endpoints were met. RC strength globally limited likely due to pain. Unable to perform Neer's/Hawkins/empty can/O'Brien's due to pain.   Assessment and plan:  Right  shoulder injury Patient is here after suffering a right shoulder injury. Mechanism of injury and physical exam is concerning for possible rotator cuff tear. Labral tear deemed unlikely. No AC joint pain suggesting separation. No clavicular pain suggesting fracture. Cannot rule out humeral head fracture/Bankart/Hill-Sachs lesion. Discussed the possibility of conservative management versus surgical intervention if this is the true diagnosis. Patient states that she would most likely want surgical repair if there is a tear. - Referral to orthopedic surgery. - Right shoulder arthrogram/MRI ordered. - Encouraged ice. - Encouraged anti-inflammatories. - Norco 21 tablets for short-term pain relief. - Flexeril 30 tablets for spasms. - Strongly encouraged continuing passive range of motion of the injured shoulder as to prevent possible frozen shoulder.   Orders Placed This Encounter  Procedures  . DG Arthro Shoulder Right    Standing Status:   Future    Standing Expiration Date:   04/18/2017    Order Specific Question:   Reason for Exam (SYMPTOM  OR DIAGNOSIS REQUIRED)    Answer:   Right shoulder injury    Order Specific Question:   Is the patient pregnant?    Answer:   No    Order Specific Question:   Preferred imaging location?    Answer:   [facility]  . MR Shoulder Right W Contrast    Standing Status:   Future    Standing Expiration Date:   04/18/2017    Order Specific Question:   If indicated for the ordered procedure, I authorize the administration of contrast media per Radiology protocol    Answer:   Yes    Order Specific  Question:   Reason for Exam (SYMPTOM  OR DIAGNOSIS REQUIRED)    Answer:   Right shoulder and drink    Order Specific Question:   Preferred imaging location?    Answer:   [facility] (table limit-350 lbs)    Order Specific Question:   What is the patient's sedation requirement?    Answer:   No Sedation    Order Specific Question:   Does the patient  have a pacemaker or implanted devices?    Answer:   No  . Ambulatory referral to Orthopedic Surgery    Referral Priority:   Routine    Referral Type:   Surgical    Referral Reason:   Specialty Services Required    Requested Specialty:   Orthopedic Surgery    Number of Visits Requested:   1    Meds ordered this encounter  Medications  . HYDROcodone-acetaminophen (NORCO) 5-325 MG tablet    Sig: Take 1 tablet by mouth 3 (three) times daily as needed for moderate pain.    Dispense:  21 tablet    Refill:  0  . cyclobenzaprine (FLEXERIL) 5 MG tablet    Sig: Take 1 tablet (5 mg total) by mouth 3 (three) times daily as needed for muscle spasms.    Dispense:  30 tablet    Refill:  0     Elberta Leatherwood, MD,MS,  PGY3 02/17/2016 12:41 PM

## 2016-02-23 ENCOUNTER — Ambulatory Visit (INDEPENDENT_AMBULATORY_CARE_PROVIDER_SITE_OTHER): Payer: BLUE CROSS/BLUE SHIELD | Admitting: Family Medicine

## 2016-02-23 ENCOUNTER — Encounter: Payer: Self-pay | Admitting: Family Medicine

## 2016-02-23 DIAGNOSIS — S4991XD Unspecified injury of right shoulder and upper arm, subsequent encounter: Secondary | ICD-10-CM

## 2016-02-23 DIAGNOSIS — G5701 Lesion of sciatic nerve, right lower limb: Secondary | ICD-10-CM

## 2016-02-23 MED ORDER — MELOXICAM 15 MG PO TABS: 15.0000 mg | ORAL_TABLET | Freq: Every day | ORAL | 2 refills | Status: AC

## 2016-02-23 MED ORDER — CYCLOBENZAPRINE HCL 5 MG PO TABS: 5.0000 mg | ORAL_TABLET | Freq: Three times a day (TID) | ORAL | 0 refills | Status: DC | PRN

## 2016-02-23 MED ORDER — GABAPENTIN 100 MG PO CAPS: 300.0000 mg | ORAL_CAPSULE | Freq: Every day | ORAL | 0 refills | Status: DC

## 2016-02-23 NOTE — Assessment & Plan Note (Signed)
Patient suicide some symptoms most consistent with piriformis syndrome. Will treat relatively conservatively at this time. - Icing of this region regularly. - Strengthening and stretching exercises 3-4 times weekly - Mobic daily 5 days then when necessary after that. - Offered physical therapy referral. Patient refused at this time.

## 2016-02-23 NOTE — Patient Instructions (Signed)
It was a pleasure seeing you today in our clinic. Today we discussed your hip pain. Here is the treatment plan we have discussed and agreed upon together:   - I've prescribed you meloxicam. Take 1 tablet a day for the next 5 days. Then take 1 tablet as needed for pain. - Do your best ice your hip every night to help with the inflammation. - You may want to look up some stretching exercises and strengthening exercises of your hip online. Perform these 3-4 times a week. - I have prescribed due to gabapentin. Take 300 mg every night before bed. This should help with some of the burning pain that you have been your right arm. - I've refilled your Flexeril. Take this as needed for muscle spasms.

## 2016-02-23 NOTE — Progress Notes (Signed)
HPI  CC: Right hip pain. Patient is complaining of right-sided hip pain. She states that pain is located to the posterior aspect of her right buttock. Pain radiates down the back of her thigh but does not go beyond the knee. Pain is worse at night while laying down and some with activity. No specific activity that seems to elicit pain. She denies any weakness, numbness, or paresthesias in this limb. Patient was seen recently after sustaining a fall in [employer]. She states that although she had some hip pain in the past it is significantly worse since this fall. No new trauma or injuries. No bowel or bladder incontinence.  Patient is also complaining of some burning and paresthesias down her right arm. As stated before she was seen recently after sustaining a fall in [employer]. She states that her pain is still very much present in that arm but has improved significantly. She still has limited function and pain in most range of motion activities with the affected arm. The burning and paresthesias that she is now experiencing occurs mostly at night while trying to relax. This discomfort radiates from the axilla down to the tips of her fingers. She denies any new injury to this area. No new swelling or ecchymoses. No neck pain or left arm symptoms. She denies weakness or numbness. She states that her range of motion is unchanged from her previous visit. No recent fevers, chills, dizziness, lightheadedness, weight changes, balance issues, chest pain, shortness of breath, nausea, vomiting, or diarrhea.  Review of Systems   See HPI for ROS.  CC, SH/smoking status, and VS noted  Objective: BP 113/66   Pulse (!) 103   Temp 98.5 F (36.9 C) (Oral)   Ht 5\' 4"  (1.626 m)   Wt 141 lb (64 kg)   BMI 24.20 kg/m  Gen: NAD, alert, cooperative. CV: Well-perfused. Resp: Non-labored. Neuro: Sensation intact throughout. Obvious signs of a sensation as extending from the right upper arm to the fingertips. Some  difficulty distinguishing specific dermatomal separation at this time. No actual numbness or weakness present. DTRs intact. Shoulder, Right:  no obvious abnormalities, atrophy or asymmetry. Significant tenderness to palpation over the lateral aspect of the shoulder below the acromion. No pain over the bicipital groove or AC joint. Range of motion significantly limited secondary to pain. [See previous visit for ROM exam]. RC strength globally limited likely due to pain. Unable to perform Neer's/Hawkins/empty can/O'Brien's due to pain. Hip, right: No evidence of bony abnormality or asymmetry. No Trendelenburg positioning. Tenderness to palpation along the posterior buttock near the gluteal fold. ROM intact. Muscle strength weak with hip abduction. Negative straight leg raise. DTRs intact bilaterally  Assessment and plan:  Piriformis syndrome of right side Patient suicide some symptoms most consistent with piriformis syndrome. Will treat relatively conservatively at this time. - Icing of this region regularly. - Strengthening and stretching exercises 3-4 times weekly - Mobic daily 5 days then when necessary after that. - Offered physical therapy referral. Patient refused at this time.  Right shoulder injury Patient was seen last week for possible rotator cuff tear. At this time she is now noticing some symptoms of paresthesias extending down the affected arm. It is possible that patient irritated the nerves in this affected arm during the injury. No signs of weakness or true numbness in this arm. Patient is neurovascularly intact. - Gabapentin 300 mg every nightly. I informed patient that this may help but she will likely not require this  for very long. - Refilled Flexeril at this time. - Encouraged continued passive range of motion exercises with wall climbs.    Meds ordered this encounter  Medications  . meloxicam (MOBIC) 15 MG tablet    Sig: Take 1 tablet (15 mg total) by mouth daily.     Dispense:  30 tablet    Refill:  2  . cyclobenzaprine (FLEXERIL) 5 MG tablet    Sig: Take 1 tablet (5 mg total) by mouth 3 (three) times daily as needed for muscle spasms.    Dispense:  30 tablet    Refill:  0  . gabapentin (NEURONTIN) 100 MG capsule    Sig: Take 3 capsules (300 mg total) by mouth at bedtime.    Dispense:  135 capsule    Refill:  0     Kathee DeltonIan D McKeag, MD,MS,  PGY3 02/23/2016 6:05 PM

## 2016-02-23 NOTE — Assessment & Plan Note (Signed)
Patient was seen last week for possible rotator cuff tear. At this time she is now noticing some symptoms of paresthesias extending down the affected arm. It is possible that patient irritated the nerves in this affected arm during the injury. No signs of weakness or true numbness in this arm. Patient is neurovascularly intact. - Gabapentin 300 mg every nightly. I informed patient that this may help but she will likely not require this for very long. - Refilled Flexeril at this time. - Encouraged continued passive range of motion exercises with wall climbs.

## 2016-02-24 ENCOUNTER — Ambulatory Visit (HOSPITAL_COMMUNITY): Payer: BLUE CROSS/BLUE SHIELD

## 2016-02-24 ENCOUNTER — Other Ambulatory Visit: Payer: Self-pay | Admitting: Family Medicine

## 2016-02-24 ENCOUNTER — Ambulatory Visit (HOSPITAL_COMMUNITY)
Admission: RE | Admit: 2016-02-24 | Discharge: 2016-02-24 | Disposition: A | Discharge status: Home / Self Care | Payer: BLUE CROSS/BLUE SHIELD | Source: Ambulatory Visit | Attending: Family Medicine | Admitting: Family Medicine

## 2016-02-24 DIAGNOSIS — S4991XA Unspecified injury of right shoulder and upper arm, initial encounter: Secondary | ICD-10-CM | POA: Insufficient documentation

## 2016-02-24 DIAGNOSIS — M7591 Shoulder lesion, unspecified, right shoulder: Secondary | ICD-10-CM

## 2016-02-24 DIAGNOSIS — X58XXXA Exposure to other specified factors, initial encounter: Secondary | ICD-10-CM | POA: Insufficient documentation

## 2016-02-26 ENCOUNTER — Telehealth: Payer: Self-pay | Admitting: *Deleted

## 2016-02-26 NOTE — Telephone Encounter (Signed)
Pt calling wanting her MRI results. Kelly Carr, CMA

## 2016-02-27 NOTE — Telephone Encounter (Signed)
Seen by  Dr. Wende MottMcKeag for this issue please forward to him

## 2016-02-29 NOTE — Telephone Encounter (Signed)
I am the preceptor for today.  Both PCP and patient who ordered MRI are out of town.    Called and discussed with patient.  Discussed MRI results with paitent which showed strain rather tear.  She didn't want to take the Neurontin because she is on lots of other potentially sedating medications.  After fairly long conversation, [facility] would [facility].  Has already been there for hip pain, which was aggravated by the fall.  Told her to call in AM for appt.  If she need a referral we can do this.

## 2016-02-29 NOTE — Telephone Encounter (Signed)
Patient calling again. Informed patient Dr. Wende MottMcKeag was on vacation until 9/29, patient states that Dr. Wende MottMcKeag told her that he would be going on vacation and her regular MD would give her results and let her know what the next steps would be. Patient very anxious about results and would like a call back as soon as possible.

## 2016-03-08 ENCOUNTER — Ambulatory Visit (INDEPENDENT_AMBULATORY_CARE_PROVIDER_SITE_OTHER): Payer: BLUE CROSS/BLUE SHIELD | Admitting: Family Medicine

## 2016-03-08 ENCOUNTER — Encounter: Payer: Self-pay | Admitting: Family Medicine

## 2016-03-08 VITALS — BP 111/81 | Ht 64.0 in | Wt 131.0 lb

## 2016-03-08 DIAGNOSIS — M25511 Pain in right shoulder: Secondary | ICD-10-CM

## 2016-03-08 DIAGNOSIS — M25551 Pain in right hip: Secondary | ICD-10-CM | POA: Diagnosis not present

## 2016-03-08 MED ORDER — NITROGLYCERIN 0.2 MG/HR TD PT24: MEDICATED_PATCH | TRANSDERMAL | 1 refills | Status: DC

## 2016-03-08 MED ORDER — PREDNISONE 10 MG PO TABS: ORAL_TABLET | ORAL | 0 refills | Status: DC

## 2016-03-08 NOTE — Assessment & Plan Note (Signed)
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 It is possible that these things were exacerbated with her fall. - Prednisone was sent in. - Try nitroglycerin protocol over area of most tenderness. - Referral placed to physical therapy - Can consider injection in the future if no improvement.

## 2016-03-08 NOTE — Progress Notes (Signed)
Kelly Carr - 44 y.o. female MRN [id number]  Date of birth: Nov 27, 1970  SUBJECTIVE:  Including CC & ROS.  Chief Complaint  Patient presents with  . Shoulder Pain  . Hip Pain    Miss Kelly Carr is a 45 year old female that is presenting with right shoulder pain and right hip pain. These 2 symptoms occurred after a fall in September. She landed on her right side and had an exacerbation of her pain. She has had an MRI of her shoulder. She has been on a lifting restriction with her work but it still seems to aggravate the pain. She has pain that is worse with abduction. She has some radiation down her upper arm. She has not had any physical therapy form at this point.  The right hip is acute on chronic in nature. She has pain that she feels mostly in the buttock. The pain is worse with lying on the affected side and worse at night. She has pain that is throbbing in nature. The pain is worse with hip flexion. She has tried a muscle relaxer, mobic, Tylenol, and Norco but doesn't like taking this because it makes her too drowsy.  ROS: No unexpected weight loss, fever, chills, swelling, instability, numbness/tingling, redness, otherwise see HPI    HISTORY: Past Medical, Surgical, Social, and Family History Reviewed & Updated per EMR.   Pertinent Historical Findings include: PMSHx -  bipolar disorder PSHx -  former smoker FHx -  hypertension, diabetes Medications -she Geodon, trazodone, Wellbutrin, Cogentin  DATA REVIEWED: 05/20/13: lumbar spine: No significant disc space narrowing. Minimal Schmorl's node deformities as noted above.  05/04/15: Right hip x-ray: no abnormality 02/24/16: MRI Right shoulder: 1. Mild tendinosis of the supraspinatus and infraspinatus tendons without a tear. 2. Mild muscle edema in the teres minor and supraspinatus muscles which may be neurogenic versus secondary to mild muscle strain. 3. Mild muscle edema in the deltoid muscle likely reflecting mild muscle  strain.  PHYSICAL EXAM:  VS: BP:111/81  HR: bpm  TEMP: ( )  RESP:   HT:5\' 4"  (162.6 cm)   WT:131 lb (59.4 kg)  BMI:22.5 PHYSICAL EXAM: Gen: NAD, alert, cooperative with exam,  HEENT: clear conjunctiva, EOMI CV:  no edema, capillary refill brisk,  Resp: non-labored, normal speech Skin: no rashes, normal turgor  Neuro: no gross deficits.  Psych:  alert and oriented Shoulder: Inspection reveals no abnormalities, atrophy or asymmetry. Palpation is normal with no tenderness over AC joint or bicipital groove. Limited range of motion in flexion and abduction to roughly 90 actively. Able to achieve full flexion and abduction passively Pain with empty can . Hip: Pelvic alignment unremarkable to inspection and palpation. Standing hip rotation and gait without trendelenburg sign / unsteadiness. Greater trochanter with some tenderness to palpation  Some tenderness to palpation over the piriformis. Discomfort with FABER or FADIR but not excessive pain. Sometimes palpation over SI joints. Normal gait. Neurovascular intact.  ASSESSMENT & PLAN:   Right hip pain It is possible that she has had an exacerbation of her right hip which could be associated with piriformis syndrome versus gluteus medius syndrome versus SI joint dysfunction. Does not seem to be real associated with her lower back whatsoever. She's had plain films of her hip that showed normal joint space and minimal arthritic changes. - Try prednisone to improve pain. - Provided exercises that she can do at home. - Referral placed to physical therapy. - To find out specifically what is going on with her hip I would  suggest ordering an MRI and she will call if she would like to have this done.  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 It is possible that these things were exacerbated with her fall. - Prednisone was sent in. - Try nitroglycerin protocol over area of most tenderness. -  Referral placed to physical therapy - Can consider injection in the future if no improvement.

## 2016-03-08 NOTE — Assessment & Plan Note (Signed)
It is possible that she has had an exacerbation of her right hip which could be associated with piriformis syndrome versus gluteus medius syndrome versus SI joint dysfunction. Does not seem to be real associated with her lower back whatsoever. She's had plain films of her hip that showed normal joint space and minimal arthritic changes. - Try prednisone to improve pain. - Provided exercises that she can do at home. - Referral placed to physical therapy. - To find out specifically what is going on with her hip I would suggest ordering an MRI and she will call if she would like to have this done.

## 2016-03-15 ENCOUNTER — Encounter: Payer: Self-pay | Admitting: Physical Therapy

## 2016-03-15 ENCOUNTER — Ambulatory Visit: Payer: BLUE CROSS/BLUE SHIELD | Attending: Internal Medicine | Admitting: Physical Therapy

## 2016-03-15 DIAGNOSIS — M6281 Muscle weakness (generalized): Secondary | ICD-10-CM | POA: Insufficient documentation

## 2016-03-15 DIAGNOSIS — M25511 Pain in right shoulder: Secondary | ICD-10-CM | POA: Diagnosis present

## 2016-03-15 DIAGNOSIS — M25551 Pain in right hip: Secondary | ICD-10-CM | POA: Insufficient documentation

## 2016-03-15 DIAGNOSIS — M25651 Stiffness of right hip, not elsewhere classified: Secondary | ICD-10-CM

## 2016-03-15 DIAGNOSIS — M25611 Stiffness of right shoulder, not elsewhere classified: Secondary | ICD-10-CM | POA: Insufficient documentation

## 2016-03-15 DIAGNOSIS — R262 Difficulty in walking, not elsewhere classified: Secondary | ICD-10-CM | POA: Diagnosis present

## 2016-03-16 NOTE — Therapy (Signed)
[facility] [address] [place], [place], [address] Phone: [phone]   Fax:  [phone]  Physical Therapy Evaluation  Patient Details  Name: Kelly Carr MRN: [id number] Date of Birth: [birth year] Referring Provider: Dr Adria Devon   Encounter Date: 03/15/2016      PT End of Session - 03/15/16 2051    Visit Number 1   Number of Visits 16   Date for PT Re-Evaluation 05/10/16   PT Start Time 1100   PT Stop Time 1154   PT Time Calculation (min) 54 min   Activity Tolerance Patient tolerated treatment well   Behavior During Therapy [facility] for tasks assessed/performed      Past Medical History:  Diagnosis Date  . Abnormal Pap smear    Unknown results>colpo>normal  . Anxiety   . Bipolar 1 disorder (HCC)     History reviewed. No pertinent surgical history.  There were no vitals filed for this visit.       Subjective Assessment - 03/15/16 1111    Subjective Patinet has a 45 year history of low back/ hip pain. She recently fell over a rack at the grocery [employer]. Since that point she has had shoulder pain and increased hip pain. Her pain increases with walking, standing, and use of the arm. Her pain is increasing.    Pertinent History Histroy of hip pain.    Limitations Standing;Walking;Lifting;House hold activities  work activity    How long can you sit comfortably? < 1 hour    How long can you stand comfortably? < 10 min    How long can you walk comfortably? limited community distances with pain    Diagnostic tests MRI: of right shoulder Mild tendonosis; Mild inflammation    Patient Stated Goals TO have less pain    Currently in Pain? Yes   Pain Score 6    Pain Location Shoulder   Pain Orientation Right;Posterior   Pain Descriptors / Indicators Burning;Aching   Pain Type Acute pain   Pain Onset 1 to 4 weeks ago   Aggravating Factors  Flexion    Pain Relieving Factors ice, rest    Effect of Pain on Daily  Activities difficulty working    Multiple Pain Sites Yes   Pain Score 6   Pain Location Hip   Pain Orientation Right   Pain Descriptors / Indicators Aching   Pain Type Chronic pain   Pain Onset More than a month ago   Pain Frequency Rarely   Aggravating Factors  standing and walking    Pain Relieving Factors rest and ice    Effect of Pain on Daily Activities difficulty standing and walking             [facility] PT Assessment - 03/16/16 0001      Assessment   Medical Diagnosis L shoulder pain, Left hip pain    Referring Provider Dr Adria Devon    Onset Date/Surgical Date --  Hip pain >1 year but exacerbated with fall. Shoulder 1 month   Hand Dominance Left   Next MD Visit will follow up after PT    Prior Therapy No      Precautions   Precautions None     Restrictions   Weight Bearing Restrictions No     Balance Screen   Has the patient fallen in the past 6 months Yes   How many times? 1   Has the patient had a decrease in activity level because of a fear  of falling?  Yes   Is the patient reluctant to leave their home because of a fear of falling?  No     Home [employer]ist information centre manager residence     Prior Function   Level of Independence Independent     Cognition   Overall Cognitive Status Within Functional Limits for tasks assessed     Observation/Other Assessments   Focus on Therapeutic Outcomes (FOTO)  52% limitation      Sensation   Light Touch Appears Intact     AROM   Right Shoulder Flexion 65 Degrees   Right Shoulder ABduction 50 Degrees   Right Shoulder Internal Rotation --  Functional IR to right gluteal    Right Shoulder External Rotation --  Functional ER to right upper trap     PROM   Overall PROM Comments Pain with right hip flexion past 75 degrees able to get to 85 degrees with significant pain    Right Shoulder Flexion 90 Degrees  significant guarding with all shoulders movements      Strength   Right Shoulder Flexion  3/5  in available range    Right Shoulder ABduction 2/5   Right Shoulder Internal Rotation 3/5   Right Shoulder External Rotation 3/5   Right Hip Flexion 3/5   Right Hip ABduction 3/5  pain with testing    Right Hip ADduction 3/5     Palpation   Palpation comment tenderness to palpation around the greater trochnater and the right gluteal . Pain in the posterior shoulder      Special Tests    Special Tests --  all shoulder and hip special tests too painful to assess      Ambulation/Gait   Gait Comments limited right hip flexion; limited right single leg stance time; right side toe out.                    [facility] Adult PT Treatment/Exercise - 03/16/16 0001      Shoulder Exercises: Supine   Other Supine Exercises supine wand flexion x5 in mid range      Shoulder Exercises: Seated   Other Seated Exercises table slides x10 with pillow      Modalities   Modalities Cryotherapy     Cryotherapy   Number Minutes Cryotherapy 10 Minutes   Cryotherapy Location Cervical;Hip   Type of Cryotherapy Ice pack     Manual Therapy   Manual therapy comments PROM into all palnes of the shoulder gentle PA and AP mobs in available range to improve motion; Postrior capsule glides on the right hip to decrease pain                 PT Education - 03/15/16 2049    Education provided Yes   Education Details HEP, symptom mangement    Person(s) Educated Patient   Methods Explanation;Tactile cues;Verbal cues;Handout   Comprehension Verbalized understanding;Returned demonstration;Tactile cues required          PT Short Term Goals - 03/16/16 0821      PT SHORT TERM GOAL #1   Title Patient will increase right shoulder flexion and abduction to 140 degrees    Time 4   Period Weeks   Status New     PT SHORT TERM GOAL #2   Title Patient will improve right shoulder external rotation by 25 degrees    Time 4   Period Weeks   Status New     PT SHORT TERM  GOAL #3   Title Patient  will improve gross right shoulder and hip strength to 4/5    Time 4   Period Weeks   Status New     PT SHORT TERM GOAL #4   Title Patient will be independent with initial HEP for strength and mobility    Time 4   Period Weeks   Status New     PT SHORT TERM GOAL #5   Title Patient will report 2/10 pain in the shoulder and hip at worst    Time 4   Period Weeks   Status New           PT Long Term Goals - 03/16/16 0825      PT LONG TERM GOAL #1   Title Patient will reach behind her head without pain in order to perfrom ADL's    Time 8   Period Weeks   Status New     PT LONG TERM GOAL #2   Title Patient will reach behind her back without increased pain    Time 8   Period Weeks   Status New     PT LONG TERM GOAL #3   Title Paitent will walk 1 mile without increased pain in order to perfrom work tasks    Time 8   Period Weeks   Status New     PT LONG TERM GOAL #4   Title Patient will stand for 1 hour without pain in ordetr to perfrom work tasks    Time 8   Period Weeks   Status New               Plan - 03/16/16 0809    Clinical Impression Statement Patient is a 45 year old female with right shoulder pain and right hip pain. Her MRI shows mild tendinosis and inflammation of the right shoulder. Her range and strength wer significantly limited but it felt like she was guarding 2nd to pain. She was also guarded in the hip. She improved range of motion with mobilizations. It was difficult to perfrom special tests 2nd to all motions hurting with movement. She would benefit from skilled therapy to adress mobility deficits and functional deficits. She was seen for a low complexity evaluation.    Rehab Potential Good   PT Frequency 2x / week   PT Duration 8 weeks   PT Treatment/Interventions ADLs/Self Care Home Management;Iontophoresis 4mg /ml Dexamethasone;Electrical Stimulation;Cryotherapy;Moist Heat;Ultrasound;Gait training;Functional mobility training;Patient/family  education;Neuromuscular re-education;Therapeutic exercise;Therapeutic activities;Manual techniques;Passive range of motion;Dry needling;Traction   PT Next Visit Plan begin with manual therapy to hip and shoulders. consider adding pulleys; supine hip flexion, seated ball squeeze, clamshells, eventually add new step. Patient is very guarded. Consider Ionto to right hip and maybe the shoulder too.    PT Home Exercise Plan table slides, wand flexion, Piriformis stretch, lateral trunk rotation       Patient will benefit from skilled therapeutic intervention in order to improve the following deficits and impairments:  Decreased range of motion, Abnormal gait, Difficulty walking, Decreased strength, Decreased mobility, Pain, Increased muscle spasms, Decreased safety awareness, Decreased endurance  Visit Diagnosis: Pain in right hip  Acute pain of right shoulder  Stiffness of right shoulder, not elsewhere classified  Stiffness of right hip, not elsewhere classified  Difficulty in walking, not elsewhere classified  Muscle weakness (generalized)     Problem List Patient Active Problem List   Diagnosis Date Noted  . Right shoulder pain 03/08/2016  . Piriformis syndrome of right  side 02/23/2016  . Right shoulder injury 02/17/2016  . HLD (hyperlipidemia) 11/12/2015  . Dysuria 05/26/2015  . Normocytic anemia 05/26/2015  . Pain of upper abdomen 05/04/2015  . 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 Right hip pain 12/27/2014  . Pharyngitis, chronic 08/11/2014  . Adjustment disorder with mixed anxiety and depressed mood 10/17/2013  . Irregular menses 07/17/2013  . GERD (gastroesophageal reflux disease) 11/10/2010  . Carpal tunnel syndrome of left wrist 11/10/2010  . Bipolar disorder (HCC) 11/25/2008  . OTH ABNORMAL PAPANICOLAOU SMEAR CERVIX&CERV HPV 11/25/2008  . DEPRESSION 10/21/2008    Dessie Coma PT DPT  03/16/2016, 8:28 AM  [facility] [address] [place], [place], [address] Phone: [phone]   Fax:  [phone]  Name: Thersia Senger MRN: [id number] Date of Birth: [birth year]

## 2016-03-25 ENCOUNTER — Telehealth: Payer: Self-pay | Admitting: *Deleted

## 2016-03-25 DIAGNOSIS — M25551 Pain in right hip: Secondary | ICD-10-CM

## 2016-03-25 NOTE — Telephone Encounter (Signed)
Order placed

## 2016-04-17 ENCOUNTER — Ambulatory Visit
Admission: RE | Admit: 2016-04-17 | Discharge: 2016-04-17 | Disposition: A | Discharge status: Home / Self Care | Payer: BLUE CROSS/BLUE SHIELD | Source: Ambulatory Visit | Attending: Sports Medicine | Admitting: Sports Medicine

## 2016-04-17 DIAGNOSIS — M25551 Pain in right hip: Secondary | ICD-10-CM

## 2016-04-19 ENCOUNTER — Telehealth: Payer: Self-pay | Admitting: Family Medicine

## 2016-04-19 NOTE — Telephone Encounter (Signed)
Spoke with patient about her MRI results. She has a superior anterior labral tear, mild tendinosis of the right gluteus minimus at the insertion and mild tendinosis of the hamstring origin bilaterally. Suggested that she should try physical therapy continue with that as that will help in her long-term treatment of these problems. She can try a intra-articular hip injection to see if that improves her pain as well.  Myra RudeJeremy E Schmitz, MD PGY-4, [facility] 04/19/2016, 5:25 PM

## 2016-04-26 ENCOUNTER — Ambulatory Visit (INDEPENDENT_AMBULATORY_CARE_PROVIDER_SITE_OTHER): Payer: BLUE CROSS/BLUE SHIELD | Admitting: Family Medicine

## 2016-04-26 ENCOUNTER — Encounter: Payer: Self-pay | Admitting: Obstetrics and Gynecology

## 2016-04-26 ENCOUNTER — Ambulatory Visit (INDEPENDENT_AMBULATORY_CARE_PROVIDER_SITE_OTHER): Payer: BLUE CROSS/BLUE SHIELD | Admitting: Obstetrics and Gynecology

## 2016-04-26 ENCOUNTER — Encounter: Payer: Self-pay | Admitting: Family Medicine

## 2016-04-26 VITALS — BP 119/71 | HR 75 | Ht 64.0 in | Wt 140.0 lb

## 2016-04-26 VITALS — BP 110/72 | HR 76 | Resp 18 | Ht 64.0 in | Wt 140.0 lb

## 2016-04-26 DIAGNOSIS — M25551 Pain in right hip: Secondary | ICD-10-CM

## 2016-04-26 DIAGNOSIS — R87612 Low grade squamous intraepithelial lesion on cytologic smear of cervix (LGSIL): Secondary | ICD-10-CM | POA: Diagnosis not present

## 2016-04-26 MED ORDER — NORGESTIMATE-ETH ESTRADIOL 0.25-35 MG-MCG PO TABS
1.0000 | ORAL_TABLET | Freq: Every day | ORAL | 11 refills | Status: DC
Start: 2016-04-26 — End: 2017-07-22

## 2016-04-26 MED ORDER — METHYLPREDNISOLONE ACETATE 40 MG/ML IJ SUSP
40.0000 mg | Freq: Once | INTRAMUSCULAR | Status: AC
  Administered 2016-04-26: 40 mg via INTRA_ARTICULAR

## 2016-04-26 MED ORDER — NORGESTIMATE-ETH ESTRADIOL 0.25-35 MG-MCG PO TABS: 1.0000 | ORAL_TABLET | Freq: Every day | ORAL | 11 refills | Status: DC

## 2016-04-26 NOTE — Progress Notes (Signed)
45 yo with history of abnormal pap smear (LSIL in 02/2015, followed by negative pap + HPV in 10/2015) presenting today for repeat pap smear. Patient is without any complaints  Past Medical History:  Diagnosis Date  . Abnormal Pap smear    Unknown results>colpo>normal  . Anxiety   . Bipolar 1 disorder (HCC)    History reviewed. No pertinent surgical history. Family History  Problem Relation Age of Onset  . Diabetes Mother   . Hypertension Mother   . Diabetes Maternal Grandmother   . Heart disease Maternal Grandmother   . Diabetes Maternal Grandfather   . Heart disease Maternal Grandfather    Social History  Substance Use Topics  . Smoking status: Former Smoker    Types: Cigarettes  . Smokeless tobacco: Never Used  . Alcohol use No   ROS See pertinent in HPI  Blood pressure 110/72, pulse 76, resp. rate 18, height 5\' 4"  (1.626 m), weight 140 lb (63.5 kg), last menstrual period 04/05/2016. GENERAL: Well-developed, well-nourished female in no acute distress.  ABDOMEN: Soft, nontender, nondistended. No organomegaly. PELVIC: Normal external female genitalia. Vagina is pink and rugated.  Normal discharge. Normal appearing cervix. Uterus is normal in size. No adnexal mass or tenderness. EXTREMITIES: No cyanosis, clubbing, or edema, 2+ distal pulses.  Labs and Imaging Diagnosis 02/27/2015 LOW GRADE SQUAMOUS INTRAEPITHELIAL LESION: CIN-1/ HPV (LSIL). Pecola LeisureJOSHUA KISH MD Pathologist, Electronic Signature (Case signed 03/04/2015) Specimen Clinical Information HPV 16/18 WILL BE PERFORMED IF HR HPV IS DETECTED Source CervicoVaginal Pap [ThinPrep Imaged] Molecular Results Test Result HPV 16,18/45 Genotyping NEGATIVE FOR HPV 16 & 18/45 HPV High Risk DETECTED  03/24/2015 Diagnosis 1. 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 2. Endocervix, curettage - BENIGN ENDOCERVICAL MUCOSA  10/21/2015 Negative with HPV high  risk DETECTED  A/P 45 yo here for repeat pap smear - pap smear collected - refill on birth control provided - patient will be contacted with any abnormal results

## 2016-04-29 LAB — CYTOLOGY - PAP: Diagnosis: NEGATIVE

## 2016-05-01 NOTE — Assessment & Plan Note (Signed)
Right hip injection today. If she feels relief than can consider an injection if she has a flare of her symptoms.  - can consider a referral to PT as well  - f/u PRN

## 2016-05-01 NOTE — Progress Notes (Signed)
  Kelly Carr - 45 y.o. female MRN [id number]  Date of birth: 05/23/1971  SUBJECTIVE:  Including CC & ROS.    Kelly Carr is a 45 yo F that is presenting today for intraarticular right hip injection for ongoing chronic right hip pain.   DATA REVIEWED: 04/26/16: MRI right hip: 1. No hip fracture, dislocation or avascular necrosis. 2. Superior anterior right labral tear. 3. Mild tendinosis of the right gluteus minimus tendon at its insertion. 4. Mild tendinosis of the hamstring origins bilaterally.  PHYSICAL EXAM:  VS: BP:119/71  HR:75bpm  TEMP: ( )  RESP:   HT:5\' 4"  (162.6 cm)   WT:140 lb (63.5 kg)  BMI:24.1   Aspiration/Injection Procedure Note Kelly Carr 05/23/1971  Procedure: Injection Indications: Right hip pain  Procedure Details Consent: Risks of procedure as well as the alternatives and risks of each were explained to the (patient/caregiver).  Consent for procedure obtained. Time Out: Verified patient identification, verified procedure, site/side was marked, verified correct patient position, special equipment/implants available, medications/allergies/relevent history reviewed, required imaging and test results available.  Performed.  The area was cleaned with iodine and alcohol swabs.    The right hip joint was injected with 10 cc of 1% lidocaine without epinephrine to anesthetize the skin and track. Then an injection using 1 cc's of 40 mg Depomedrol and 4 cc's of 1% lidocaine with a 22 3" needle.  Ultrasound was used. Images weren't obtained.    A sterile dressing was applied.  Patient did tolerate procedure well.  ASSESSMENT & PLAN:   Right hip pain Right hip injection today. If she feels relief than can consider an injection if she has a flare of her symptoms.  - can consider a referral to PT as well  - f/u PRN

## 2016-05-03 ENCOUNTER — Ambulatory Visit: Payer: BLUE CROSS/BLUE SHIELD | Admitting: Internal Medicine

## 2016-05-04 ENCOUNTER — Ambulatory Visit: Payer: BLUE CROSS/BLUE SHIELD | Attending: Internal Medicine | Admitting: Physical Therapy

## 2016-05-04 ENCOUNTER — Encounter: Payer: Self-pay | Admitting: Physical Therapy

## 2016-05-04 DIAGNOSIS — M6281 Muscle weakness (generalized): Secondary | ICD-10-CM | POA: Diagnosis present

## 2016-05-04 DIAGNOSIS — M25651 Stiffness of right hip, not elsewhere classified: Secondary | ICD-10-CM

## 2016-05-04 DIAGNOSIS — M25551 Pain in right hip: Secondary | ICD-10-CM

## 2016-05-04 DIAGNOSIS — R262 Difficulty in walking, not elsewhere classified: Secondary | ICD-10-CM

## 2016-05-04 NOTE — Therapy (Signed)
[facility] [address] [place], [place], [address] Phone: [phone]   Fax:  [phone]  Physical Therapy Evaluation  Patient Details  Name: Kelly Carr MRN: [id number] Date of Birth: 08-19-1970 Referring Provider: Clare GandyJeremy Schmitz, MD  Encounter Date: 05/04/2016      PT End of Session - 05/04/16 1254    Visit Number 1   Number of Visits 16   Date for PT Re-Evaluation 07/04/16   PT Start Time 0920   PT Stop Time 1015   PT Time Calculation (min) 55 min   Activity Tolerance Patient tolerated treatment well   Behavior [facility] for tasks assessed/performed      Past Medical History:  Diagnosis Date  . Abnormal Pap smear    Unknown results>colpo>normal  . Anxiety   . Bipolar 1 disorder (HCC)     History reviewed. No pertinent surgical history.  There were no vitals filed for this visit.       Subjective Assessment - 05/04/16 0932    Subjective Patinet has a 1 year history of  hip pain. She recently fell over a rack at the grocery [employer] in september 2017. She recently received a hip injection last week and has already seen improvements in her ROM and function. Current pain is 3-4/10 at rest.    Limitations Standing;Walking;Lifting;House hold activities   How long can you sit comfortably? 30 minutes   How long can you stand comfortably? 10 minutes   How long can you walk comfortably? 20 minutes   Diagnostic tests R hip MRI on 04/26/16: R hamstring tendinois, R gluteal minimus tendinosis, R superior anterior labral tear   Patient Stated Goals Stop hurting, get back to PLOF, sleeping without pain on my r side   Currently in Pain? Yes   Pain Score 4    Pain Location Hip   Pain Orientation Right   Pain Descriptors / Indicators Aching;Throbbing   Pain Type Chronic pain   Pain Onset More than a month ago   Pain Frequency Intermittent   Aggravating Factors  bending, lifting, standing, squating, sitting  long periods   Pain Relieving Factors ice, pain meds   Effect of Pain on Daily Activities difficulty working, household chores   Multiple Pain Sites No            [facility] PT Assessment - 05/04/16 0001      Assessment   Medical Diagnosis R hip pain   Referring Provider Clare GandyJeremy Schmitz, MD   Hand Dominance Left   Next MD Visit 4 weeks   Prior Therapy yes, PT evaluation 03/15/16, unable to return for visits due to financial restraints     Precautions   Precautions None     Restrictions   Weight Bearing Restrictions No     Balance Screen   Has the patient fallen in the past 6 months Yes   How many times? 1   Has the patient had a decrease in activity level because of a fear of falling?  Yes   Is the patient reluctant to leave their home because of a fear of falling?  No     Home Environment   Living Environment Private residence   Living Arrangements Children   Available Help at Discharge Family   Type of Home House   Home Access Stairs to enter   Entrance Stairs-Number of Steps 2   Entrance Stairs-Rails None   Home Layout One level   Home Equipment None  Prior Function   Level of Independence Independent   Vocation Full time employment   Engineer, miningVocation Requirements [employer]   Leisure walking     Cognition   Overall Cognitive Status Within Functional Limits for tasks assessed     Observation/Other Assessments   Focus on Therapeutic Outcomes (FOTO)  65% limitation     ROM / Strength   AROM / PROM / Strength AROM;Strength     AROM   AROM Assessment Site --   Right/Left Hip --     PROM   Overall PROM Comments R SLR: 30 degrees, L SLR: 42 degrees     Strength   Strength Assessment Site Hip   Right/Left Shoulder --   Right/Left Hip Right   Right Hip Flexion 3-/5   Right Hip Extension 3-/5   Right Hip ABduction 3-/5   Right Hip ADduction 3-/5     Palpation   Palpation comment Pt with "c" sign and tenderness over the the R gluteal region and greater  trochanter     Transfers   Five time sit to stand comments  36 seconds     Ambulation/Gait   Ambulation/Gait Yes   Ambulation/Gait Assistance 7: Independent   Ambulation Distance (Feet) 50 Feet   Assistive device None   Gait Pattern Step-through pattern;Decreased step length - left;Decreased stance time - right;Decreased hip/knee flexion - right;Decreased dorsiflexion - right;Antalgic   Gait Comments limited hip flexion during swing phase                           PT Education - 05/04/16 0942    Education provided Yes   Education Details HEP   Person(s) Educated Patient   Methods Explanation;Demonstration;Tactile cues;Handout;Verbal cues   Comprehension Returned demonstration;Verbalized understanding          PT Short Term Goals - 05/04/16 1310      PT SHORT TERM GOAL #1   Title Patient will be independent with initial HEP for strength and mobility    Time 4   Period Weeks   Status New     PT SHORT TERM GOAL #2   Title Pt will report pain less than/equal to 3/10 with ambulation for 15 minutes.    Period Weeks   Status New     PT SHORT TERM GOAL #3   Title Pt will be able to navigate a flight of stairs with single rail support without difficulty.    Baseline moderate difficutly   Period Weeks   Status New           PT Long Term Goals - 05/04/16 1316      PT LONG TERM GOAL #1   Title Pt will improve her FOTO score from 65% limitation to </=50% limitation in order to improve functional mobility.    Time 8   Period Weeks   Status New     PT LONG TERM GOAL #2   Title Pt will imporve her hamstring flexibility in order to perform a SLR to 80 degrees bilaterally in order to improve gait.    Baseline R: 30 degrees, L: 42 degrees.    Time 8   Period Weeks   Status New     PT LONG TERM GOAL #3   Title Paitent will walk 1 mile without increased pain in order to perfrom work tasks    Time 8   Period Weeks   Status New     PT LONG TERM  GOAL  #4   Title Patient will stand for 1 hour without pain in ordetr to perfrom work tasks    Time 8   Period Weeks   Status New     PT LONG TERM GOAL #5   Title Pt will improve her R LE strength to >/= 4/5 in order to improve gait and functioanl mobility.    Time 8   Period Weeks   Status New               Plan - 05/04/16 1256    Clinical Impression Statement Pt is a 45 year old female with chronic R hip pain which is limiting her mobility. Pt's recent MRI showed R hamstring tendinosis, R gluteal minimus tendinosis, and R superior anterior labral tear. Pt reporting pain of 3-4/10 today during evaluation. Pt  reported hip injection last week which has helped with pain. Pt reporting that since the injection she is able to put on her shoes. Pt presenting with limited ROM in bilateral hamstrings, R LE weakness, and decreased overall function. Skilled PT needed to address the below impairments with the below interventions to improve pt's overall functional mobility.    Rehab Potential Good   PT Frequency 2x / week   PT Duration 8 weeks   PT Treatment/Interventions ADLs/Self Care Home Management;Iontophoresis 4mg /ml Dexamethasone;Electrical Stimulation;Cryotherapy;Moist Heat;Ultrasound;Gait training;Functional mobility training;Patient/family education;Neuromuscular re-education;Therapeutic exercise;Therapeutic activities;Manual techniques;Passive range of motion;Dry needling;Traction   PT Next Visit Plan Nustep, hip stretching/strengthening, hamstring stretches, Pt is guarded with her R hip and limited by pain.    PT Home Exercise Plan hamstring stretches, clam shells, heel slides   Consulted and Agree with Plan of Care Patient      Patient will benefit from skilled therapeutic intervention in order to improve the following deficits and impairments:  Decreased range of motion, Abnormal gait, Difficulty walking, Decreased strength, Decreased mobility, Pain, Increased muscle spasms, Decreased  safety awareness, Decreased endurance  Visit Diagnosis: Pain in right hip  Stiffness of right hip, not elsewhere classified  Difficulty in walking, not elsewhere classified  Muscle weakness (generalized)     Problem List Patient Active Problem List   Diagnosis Date Noted  . Right shoulder pain 03/08/2016  . Piriformis syndrome of right side 02/23/2016  . Right shoulder injury 02/17/2016  . HLD (hyperlipidemia) 11/12/2015  . Dysuria 05/26/2015  . Normocytic anemia 05/26/2015  . Pain of upper abdomen 05/04/2015  . 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 Right hip pain 12/27/2014  . Pharyngitis, chronic 08/11/2014  . Adjustment disorder with mixed anxiety and depressed mood 10/17/2013  . Irregular menses 07/17/2013  . GERD (gastroesophageal reflux disease) 11/10/2010  . Carpal tunnel syndrome of left wrist 11/10/2010  . Bipolar disorder (HCC) 11/25/2008  . OTH ABNORMAL PAPANICOLAOU SMEAR CERVIX&CERV HPV 11/25/2008  . DEPRESSION 10/21/2008    Sharmon Leyden. MPT  05/04/2016, 1:25 PM  [facility] [address] [place], [place], [address] Phone: [phone]   Fax:  [phone]  Name: Kelly Carr MRN: [id number] Date of Birth: Oct 07, 1970

## 2016-05-10 ENCOUNTER — Ambulatory Visit: Payer: BLUE CROSS/BLUE SHIELD | Attending: Internal Medicine

## 2016-05-10 DIAGNOSIS — M25611 Stiffness of right shoulder, not elsewhere classified: Secondary | ICD-10-CM | POA: Diagnosis present

## 2016-05-10 DIAGNOSIS — M6281 Muscle weakness (generalized): Secondary | ICD-10-CM | POA: Diagnosis present

## 2016-05-10 DIAGNOSIS — R262 Difficulty in walking, not elsewhere classified: Secondary | ICD-10-CM

## 2016-05-10 DIAGNOSIS — M25511 Pain in right shoulder: Secondary | ICD-10-CM | POA: Diagnosis present

## 2016-05-10 DIAGNOSIS — M25551 Pain in right hip: Secondary | ICD-10-CM | POA: Diagnosis not present

## 2016-05-10 DIAGNOSIS — M25651 Stiffness of right hip, not elsewhere classified: Secondary | ICD-10-CM

## 2016-05-10 NOTE — Therapy (Signed)
[facility] [address] [place], [place], [address] Phone: [phone]   Fax:  [phone]  Physical Therapy Treatment  Patient Details  Name: Kelly Carr MRN: [id number] Date of Birth: January 15, 1971 Referring Provider: Clare Gandy, MD  Encounter Date: 05/10/2016      PT End of Session - 05/10/16 1154    Visit Number 2   Number of Visits 16   Date for PT Re-Evaluation 07/04/16   PT Start Time 1149   PT Stop Time 1240   PT Time Calculation (min) 51 min   Activity Tolerance Patient tolerated treatment well   Behavior During Therapy [facility] for tasks assessed/performed      Past Medical History:  Diagnosis Date  . Abnormal Pap smear    Unknown results>colpo>normal  . Anxiety   . Bipolar 1 disorder (HCC)     No past surgical history on file.  There were no vitals filed for this visit.      Subjective Assessment - 05/10/16 1150    Subjective She reports she has not done exercises 2x/day does as able due to work. She reports she is the same. Prior to fall pain was 7-8/10.     Currently in Pain? Yes   Pain Location Hip   Pain Orientation Right   Pain Descriptors / Indicators Aching;Throbbing   Pain Type Chronic pain   Pain Onset More than a month ago   Pain Frequency Constant   Aggravating Factors  bending /lifting , work.    Pain Relieving Factors ice meds   Multiple Pain Sites No                         [facility] Adult PT Treatment/Exercise - 05/10/16 0001      Knee/Hip Exercises: Aerobic   Elliptical L2 LE only 2 min     Knee/Hip Exercises: Standing   Forward Step Up Right;Left;5 reps;Hand Hold: 1;Step Height: 8"   Wall Squat 3 seconds;10 reps     Knee/Hip Exercises: Sidelying   Hip ABduction Right;Left;10 reps   Clams RT/LT x 10     Knee/Hip Exercises: Prone   Hamstring Curl 1 set;15 reps  RT/LT   Straight Leg Raises Right;Left  12 reps     Shoulder Exercises: Supine   Flexion  Right;Left;10 reps     Modalities   Modalities Moist Heat     Moist Heat Therapy   Number Minutes Moist Heat 15 Minutes   Moist Heat Location Hip  right     Manual Therapy   Manual Therapy Passive ROM;Manual Traction;Soft tissue mobilization   Soft tissue mobilization with roller stick x 4 min   Passive ROM All planes each hip with verbal cuig to relax and allow available motion to occur   Manual Traction long axis traction Lt and RT leg with Gr2-3 osscilations       touch to 10 inch step x 12 RT and LT            PT Short Term Goals - 05/04/16 1310      PT SHORT TERM GOAL #1   Title Patient will be independent with initial HEP for strength and mobility    Time 4   Period Weeks   Status New     PT SHORT TERM GOAL #2   Title Pt will report pain less than/equal to 3/10 with ambulation for 15 minutes.    Period Weeks   Status New  PT SHORT TERM GOAL #3   Title Pt will be able to navigate a flight of stairs with single rail support without difficulty.    Baseline moderate difficutly   Period Weeks   Status New           PT Long Term Goals - 05/04/16 1316      PT LONG TERM GOAL #1   Title Pt will improve her FOTO score from 65% limitation to </=50% limitation in order to improve functional mobility.    Time 8   Period Weeks   Status New     PT LONG TERM GOAL #2   Title Pt will imporve her hamstring flexibility in order to perform a SLR to 80 degrees bilaterally in order to improve gait.    Baseline R: 30 degrees, L: 42 degrees.    Time 8   Period Weeks   Status New     PT LONG TERM GOAL #3   Title Paitent will walk 1 mile without increased pain in order to perfrom work tasks    Time 8   Period Weeks   Status New     PT LONG TERM GOAL #4   Title Patient will stand for 1 hour without pain in ordetr to perfrom work tasks    Time 8   Period Weeks   Status New     PT LONG TERM GOAL #5   Title Pt will improve her R LE strength to >/= 4/5 in  order to improve gait and functioanl mobility.    Time 8   Period Weeks   Status New               Plan - 05/10/16 1155    Clinical Impression Statement All movements of RT hip painful but more than she will do activityly . She is also able to touch distal tibia into flexion.     PT Treatment/Interventions ADLs/Self Care Home Management;Iontophoresis 4mg /ml Dexamethasone;Electrical Stimulation;Cryotherapy;Moist Heat;Ultrasound;Gait training;Functional mobility training;Patient/family education;Neuromuscular re-education;Therapeutic exercise;Therapeutic activities;Manual techniques;Passive range of motion;Dry needling;Traction   PT Next Visit Plan Nustep, hip stretching/strengthening, hamstring stretches, Pt is guarded with her R hip and limited by pain.    PT Home Exercise Plan hamstring stretches, clam shells, heel slides   Consulted and Agree with Plan of Care Patient      Patient will benefit from skilled therapeutic intervention in order to improve the following deficits and impairments:  Decreased range of motion, Abnormal gait, Difficulty walking, Decreased strength, Decreased mobility, Pain, Increased muscle spasms, Decreased safety awareness, Decreased endurance  Visit Diagnosis: Pain in right hip  Stiffness of right hip, not elsewhere classified  Difficulty in walking, not elsewhere classified  Muscle weakness (generalized)  Acute pain of right shoulder     Problem List Patient Active Problem List   Diagnosis Date Noted  . Right shoulder pain 03/08/2016  . Piriformis syndrome of right side 02/23/2016  . Right shoulder injury 02/17/2016  . HLD (hyperlipidemia) 11/12/2015  . Dysuria 05/26/2015  . Normocytic anemia 05/26/2015  . Pain of upper abdomen 05/04/2015  . 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 Right hip pain 12/27/2014  . Pharyngitis, chronic 08/11/2014  . Adjustment disorder with mixed anxiety and  depressed mood 10/17/2013  . Irregular menses 07/17/2013  . GERD (gastroesophageal reflux disease) 11/10/2010  . Carpal tunnel syndrome of left wrist 11/10/2010  . Bipolar disorder (HCC) 11/25/2008  . OTH ABNORMAL PAPANICOLAOU SMEAR CERVIX&CERV HPV 11/25/2008  .  DEPRESSION 10/21/2008    Caprice RedChasse, Sundeep Cary M  PT 05/10/2016, 12:34 PM  [facility] [address] [place], [place], [address] Phone: [phone]   Fax:  [phone]  Name: Kelly Carr MRN: [id number] Date of Birth: [birth year]

## 2016-05-12 ENCOUNTER — Ambulatory Visit: Payer: BLUE CROSS/BLUE SHIELD | Admitting: Physical Therapy

## 2016-05-12 DIAGNOSIS — M25551 Pain in right hip: Secondary | ICD-10-CM | POA: Diagnosis not present

## 2016-05-12 DIAGNOSIS — M25651 Stiffness of right hip, not elsewhere classified: Secondary | ICD-10-CM

## 2016-05-12 DIAGNOSIS — M6281 Muscle weakness (generalized): Secondary | ICD-10-CM

## 2016-05-12 DIAGNOSIS — R262 Difficulty in walking, not elsewhere classified: Secondary | ICD-10-CM

## 2016-05-12 NOTE — Therapy (Signed)
[facility] [address] [place], [place], [address] Phone: [phone]   Fax:  [phone]  Physical Therapy Treatment  Patient Details  Name: Kelly Carr MRN: [id number] Date of Birth: 1971/03/11 Referring Provider: Clare GandyJeremy Schmitz, MD  Encounter Date: 05/12/2016      PT End of Session - 05/12/16 1013    Visit Number 3   Number of Visits 16   Date for PT Re-Evaluation 07/04/16   PT Start Time 0937   PT Stop Time 1030   PT Time Calculation (min) 53 min      Past Medical History:  Diagnosis Date  . Abnormal Pap smear    Unknown results>colpo>normal  . Anxiety   . Bipolar 1 disorder (HCC)     No past surgical history on file.  There were no vitals filed for this visit.      Subjective Assessment - 05/12/16 0940    Subjective Doing exercises 1 x per day. Had a lot of pain after last visit.    Currently in Pain? Yes   Pain Score 2    Pain Location Hip   Pain Orientation Right                         [facility] Adult PT Treatment/Exercise - 05/12/16 0001      Knee/Hip Exercises: Stretches   Piriformis Stretch 3 reps;30 seconds   Piriformis Stretch Limitations figure 4 stretch 3 x 30 sec      Knee/Hip Exercises: Aerobic   Nustep LL3 LE only x 4 min, c/o increased pain      Knee/Hip Exercises: Supine   Bridges Limitations x 10     Knee/Hip Exercises: Sidelying   Hip ABduction Right;10 reps   Clams rt x10, reverse clam x10     Knee/Hip Exercises: Prone   Straight Leg Raises 10 reps;Right     Moist Heat Therapy   Number Minutes Moist Heat 15 Minutes   Moist Heat Location Hip  right                PT Education - 05/12/16 1013    Education provided Yes   Education Details HEP    Person(s) Educated Patient   Methods Explanation;Handout   Comprehension Verbalized understanding          PT Short Term Goals - 05/04/16 1310      PT SHORT TERM GOAL #1   Title Patient will  be independent with initial HEP for strength and mobility    Time 4   Period Weeks   Status New     PT SHORT TERM GOAL #2   Title Pt will report pain less than/equal to 3/10 with ambulation for 15 minutes.    Period Weeks   Status New     PT SHORT TERM GOAL #3   Title Pt will be able to navigate a flight of stairs with single rail support without difficulty.    Baseline moderate difficutly   Period Weeks   Status New           PT Long Term Goals - 05/04/16 1316      PT LONG TERM GOAL #1   Title Pt will improve her FOTO score from 65% limitation to </=50% limitation in order to improve functional mobility.    Time 8   Period Weeks   Status New     PT LONG TERM GOAL #2   Title Pt will imporve  her hamstring flexibility in order to perform a SLR to 80 degrees bilaterally in order to improve gait.    Baseline R: 30 degrees, L: 42 degrees.    Time 8   Period Weeks   Status New     PT LONG TERM GOAL #3   Title Paitent will walk 1 mile without increased pain in order to perfrom work tasks    Time 8   Period Weeks   Status New     PT LONG TERM GOAL #4   Title Patient will stand for 1 hour without pain in ordetr to perfrom work tasks    Time 8   Period Weeks   Status New     PT LONG TERM GOAL #5   Title Pt will improve her R LE strength to >/= 4/5 in order to improve gait and functioanl mobility.    Time 8   Period Weeks   Status New               Plan - 05/12/16 1346    Clinical Impression Statement Increased pain after last visit. Pt wants to work on hip ER ROM. We added piriformis stretches and gentle capsule stretches. Increased pain with all exercises. MHP to decrease soreness/pain after therex.    PT Next Visit Plan Nustep, hip stretching/strengthening, hamstring stretches, Pt is guarded with her R hip and limited by pain. Gentle therex.    PT Home Exercise Plan hamstring stretches, clam shells, heel slides   Consulted and Agree with Plan of Care Patient       Patient will benefit from skilled therapeutic intervention in order to improve the following deficits and impairments:  Decreased range of motion, Abnormal gait, Difficulty walking, Decreased strength, Decreased mobility, Pain, Increased muscle spasms, Decreased safety awareness, Decreased endurance  Visit Diagnosis: Pain in right hip  Stiffness of right hip, not elsewhere classified  Difficulty in walking, not elsewhere classified  Muscle weakness (generalized)     Problem List Patient Active Problem List   Diagnosis Date Noted  . Right shoulder pain 03/08/2016  . Piriformis syndrome of right side 02/23/2016  . Right shoulder injury 02/17/2016  . HLD (hyperlipidemia) 11/12/2015  . Dysuria 05/26/2015  . Normocytic anemia 05/26/2015  . Pain of upper abdomen 05/04/2015  . 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 Right hip pain 12/27/2014  . Pharyngitis, chronic 08/11/2014  . Adjustment disorder with mixed anxiety and depressed mood 10/17/2013  . Irregular menses 07/17/2013  . GERD (gastroesophageal reflux disease) 11/10/2010  . Carpal tunnel syndrome of left wrist 11/10/2010  . Bipolar disorder (HCC) 11/25/2008  . OTH ABNORMAL PAPANICOLAOU SMEAR CERVIX&CERV HPV 11/25/2008  . DEPRESSION 10/21/2008    Sherrie Mustacheonoho, [place] Mccutchen McGee, PTA 05/12/2016, 1:55 PM  [facility] [address] [place], [place], [address] Phone: [phone]   Fax:  [phone]  Name: Kelly Carr MRN: [id number] Date of Birth: 08/14/1970

## 2016-05-17 ENCOUNTER — Ambulatory Visit: Payer: BLUE CROSS/BLUE SHIELD | Admitting: Physical Therapy

## 2016-05-17 DIAGNOSIS — M25651 Stiffness of right hip, not elsewhere classified: Secondary | ICD-10-CM

## 2016-05-17 DIAGNOSIS — R262 Difficulty in walking, not elsewhere classified: Secondary | ICD-10-CM

## 2016-05-17 DIAGNOSIS — M25551 Pain in right hip: Secondary | ICD-10-CM | POA: Diagnosis not present

## 2016-05-17 DIAGNOSIS — M6281 Muscle weakness (generalized): Secondary | ICD-10-CM

## 2016-05-17 NOTE — Therapy (Signed)
[facility] [address] [place], [place], [address] Phone: [phone]   Fax:  [phone]  Physical Therapy Treatment  Patient Details  Name: Kelly Carr MRN: [id number] Date of Birth: [birth year] Referring Provider: Clare GandyJeremy Schmitz, MD  Encounter Date: 05/17/2016      PT End of Session - 05/17/16 1421    Visit Number 4   Number of Visits 16   Date for PT Re-Evaluation 07/04/16   PT Start Time 0218   PT Stop Time 0315   PT Time Calculation (min) 57 min      Past Medical History:  Diagnosis Date  . Abnormal Pap smear    Unknown results>colpo>normal  . Anxiety   . Bipolar 1 disorder (HCC)     No past surgical history on file.  There were no vitals filed for this visit.      Subjective Assessment - 05/17/16 1421    Subjective Pain this morning due to long day on feet at work    Currently in Pain? Yes   Pain Score 4    Pain Location Hip   Pain Orientation Right   Pain Descriptors / Indicators Aching   Aggravating Factors  prolonged activity    Pain Relieving Factors ice, meds, rest                          [facility] Adult PT Treatment/Exercise - 05/17/16 0001      Knee/Hip Exercises: Stretches   Active Hamstring Stretch 3 reps;30 seconds   Piriformis Stretch 3 reps;30 seconds   Piriformis Stretch Limitations figure 4 stretch 3 x 30 sec      Knee/Hip Exercises: Aerobic   Nustep LL3 LE only x 5 minutes     Knee/Hip Exercises: Supine   Bridges Limitations x 10  with DF   Straight Leg Raises 10 reps   Other Supine Knee/Hip Exercises supine clam x 10 green band    Other Supine Knee/Hip Exercises ball squeeze x 10  Supine 4 way hip x 12 each      Knee/Hip Exercises: Sidelying   Clams rt x15, reverse clam x15     Moist Heat Therapy   Number Minutes Moist Heat 15 Minutes   Moist Heat Location Hip                PT Education - 05/17/16 1452    Education provided Yes   Education Details HEP   Person(s) Educated Patient   Methods Explanation;Handout   Comprehension Verbalized understanding          PT Short Term Goals - 05/04/16 1310      PT SHORT TERM GOAL #1   Title Patient will be independent with initial HEP for strength and mobility    Time 4   Period Weeks   Status New     PT SHORT TERM GOAL #2   Title Pt will report pain less than/equal to 3/10 with ambulation for 15 minutes.    Period Weeks   Status New     PT SHORT TERM GOAL #3   Title Pt will be able to navigate a flight of stairs with single rail support without difficulty.    Baseline moderate difficutly   Period Weeks   Status New           PT Long Term Goals - 05/04/16 1316      PT LONG TERM GOAL #1   Title Pt will improve  her FOTO score from 65% limitation to </=50% limitation in order to improve functional mobility.    Time 8   Period Weeks   Status New     PT LONG TERM GOAL #2   Title Pt will imporve her hamstring flexibility in order to perform a SLR to 80 degrees bilaterally in order to improve gait.    Baseline R: 30 degrees, L: 42 degrees.    Time 8   Period Weeks   Status New     PT LONG TERM GOAL #3   Title Paitent will walk 1 mile without increased pain in order to perfrom work tasks    Time 8   Period Weeks   Status New     PT LONG TERM GOAL #4   Title Patient will stand for 1 hour without pain in ordetr to perfrom work tasks    Time 8   Period Weeks   Status New     PT LONG TERM GOAL #5   Title Pt will improve her R LE strength to >/= 4/5 in order to improve gait and functioanl mobility.    Time 8   Period Weeks   Status New               Plan - 05/17/16 1512    Clinical Impression Statement More tolerance to Nustep with encouragment. Added more strengthening to HEP. Pt feels like muscles are spasming at end of treatment.    PT Next Visit Plan Nustep, hip stretching/strengthening, hamstring stretches, Pt is guarded with her R hip  and limited by pain. Gentle therex.    PT Home Exercise Plan hamstring stretches, clam shells,reverse clam shells,  heel slides, 4 way hip on mat   Consulted and Agree with Plan of Care Patient      Patient will benefit from skilled therapeutic intervention in order to improve the following deficits and impairments:  Decreased range of motion, Abnormal gait, Difficulty walking, Decreased strength, Decreased mobility, Pain, Increased muscle spasms, Decreased safety awareness, Decreased endurance  Visit Diagnosis: Pain in right hip  Stiffness of right hip, not elsewhere classified  Difficulty in walking, not elsewhere classified  Muscle weakness (generalized)     Problem List Patient Active Problem List   Diagnosis Date Noted  . Right shoulder pain 03/08/2016  . Piriformis syndrome of right side 02/23/2016  . Right shoulder injury 02/17/2016  . HLD (hyperlipidemia) 11/12/2015  . Dysuria 05/26/2015  . Normocytic anemia 05/26/2015  . Pain of upper abdomen 05/04/2015  . 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 Right hip pain 12/27/2014  . Pharyngitis, chronic 08/11/2014  . Adjustment disorder with mixed anxiety and depressed mood 10/17/2013  . Irregular menses 07/17/2013  . GERD (gastroesophageal reflux disease) 11/10/2010  . Carpal tunnel syndrome of left wrist 11/10/2010  . Bipolar disorder (HCC) 11/25/2008  . OTH ABNORMAL PAPANICOLAOU SMEAR CERVIX&CERV HPV 11/25/2008  . DEPRESSION 10/21/2008    Sherrie Mustacheonoho, Aaryn Parrilla McGee, PTA 05/17/2016, 3:16 PM  [facility] [address] [place], [place], [address] Phone: [phone]   Fax:  [phone]  Name: Casmira Freund MRN: [id number] Date of Birth: [birth year]

## 2016-05-17 NOTE — Patient Instructions (Addendum)
Hip Extension (Prone)   Lift left leg __12__ inches from floor, keeping knee locked. Repeat _10___ times per set. Do __2__ sets per session. Do _2___ sessions per day.  http://orth.exer.us/98   Copyright  VHI. All rights reserved.  Hip Adduction: Leg Lift (Eccentric) - Side-Lying   Lie on side with top leg bent, foot flat behind lower leg. Quickly lift lower leg. Slowly lower for 3-5 seconds. _10-20__ reps per set, _2__ sets per day, _7__ days per week.  Abduction: Side Leg Lift (Eccentric) - Side-Lying   Lie on side. Lift top leg slightly higher than shoulder level. Keep top leg straight with body, toes pointing forward. Slowly lower for 3-5 seconds. __10-20_ reps per set, __2_ sets per day, __7_ days per week.  Strengthening: Straight Leg Raise (Phase 1)   Tighten muscles on front of right thigh, then lift leg ____ inches from surface, keeping knee locked.  Repeat __10__ times per set. Do __2__ sets per session. Do _2___ sessions per day.    Clam    Lie on side, legs bent 90. Open top knee to ceiling, rotating leg outward. Touch toes to ankle of bottom leg. Close knees, rotating leg inward. Maintain hip position. Repeat __10-20__ times. Repeat on other side. Do _2___ sessions per day.

## 2016-05-20 ENCOUNTER — Encounter: Payer: Self-pay | Admitting: Family Medicine

## 2016-05-20 ENCOUNTER — Ambulatory Visit (INDEPENDENT_AMBULATORY_CARE_PROVIDER_SITE_OTHER): Payer: BLUE CROSS/BLUE SHIELD | Admitting: Family Medicine

## 2016-05-20 ENCOUNTER — Other Ambulatory Visit: Payer: Self-pay

## 2016-05-20 ENCOUNTER — Ambulatory Visit: Payer: BLUE CROSS/BLUE SHIELD | Admitting: Physical Therapy

## 2016-05-20 ENCOUNTER — Ambulatory Visit (HOSPITAL_COMMUNITY)
Admission: RE | Admit: 2016-05-20 | Discharge: 2016-05-20 | Disposition: A | Discharge status: Home / Self Care | Payer: BLUE CROSS/BLUE SHIELD | Source: Ambulatory Visit | Attending: Family Medicine | Admitting: Family Medicine

## 2016-05-20 VITALS — BP 102/66 | HR 75 | Temp 98.2°F | Wt 135.0 lb

## 2016-05-20 DIAGNOSIS — M25551 Pain in right hip: Secondary | ICD-10-CM | POA: Diagnosis not present

## 2016-05-20 DIAGNOSIS — Z0000 Encounter for general adult medical examination without abnormal findings: Secondary | ICD-10-CM | POA: Insufficient documentation

## 2016-05-20 DIAGNOSIS — Z23 Encounter for immunization: Secondary | ICD-10-CM

## 2016-05-20 DIAGNOSIS — M25651 Stiffness of right hip, not elsewhere classified: Secondary | ICD-10-CM

## 2016-05-20 DIAGNOSIS — R262 Difficulty in walking, not elsewhere classified: Secondary | ICD-10-CM

## 2016-05-20 DIAGNOSIS — M25511 Pain in right shoulder: Secondary | ICD-10-CM

## 2016-05-20 DIAGNOSIS — M25611 Stiffness of right shoulder, not elsewhere classified: Secondary | ICD-10-CM

## 2016-05-20 DIAGNOSIS — M6281 Muscle weakness (generalized): Secondary | ICD-10-CM

## 2016-05-20 LAB — CBC
HCT: 38.1 % (ref 35.0–45.0)
Hemoglobin: 12.4 g/dL (ref 11.7–15.5)
MCH: 28.6 pg (ref 27.0–33.0)
MCHC: 32.5 g/dL (ref 32.0–36.0)
MCV: 87.8 fL (ref 80.0–100.0)
MPV: 11.6 fL (ref 7.5–12.5)
Platelets: 277 10*3/uL (ref 140–400)
RBC: 4.34 MIL/uL (ref 3.80–5.10)
RDW: 13.5 % (ref 11.0–15.0)
WBC: 7 10*3/uL (ref 3.8–10.8)

## 2016-05-20 LAB — COMPLETE METABOLIC PANEL WITH GFR
ALT: 11 U/L (ref 6–29)
AST: 15 U/L (ref 10–30)
Albumin: 4 g/dL (ref 3.6–5.1)
Alkaline Phosphatase: 42 U/L (ref 33–115)
BUN: 10 mg/dL (ref 7–25)
CO2: 28 mmol/L (ref 20–31)
Calcium: 8.9 mg/dL (ref 8.6–10.2)
Chloride: 105 mmol/L (ref 98–110)
Creat: 0.7 mg/dL (ref 0.50–1.10)
GFR, Est African American: >89 mL/min (ref >=60)
GFR, Est Non African American: >89 mL/min (ref >=60)
Glucose, Bld: 89 mg/dL (ref 65–99)
Potassium: 4 mmol/L (ref 3.5–5.3)
Sodium: 140 mmol/L (ref 135–146)
Total Bilirubin: 0.6 mg/dL (ref 0.2–1.2)
Total Protein: 6.2 g/dL (ref 6.1–8.1)

## 2016-05-20 LAB — HIV ANTIBODY (ROUTINE TESTING W REFLEX): HIV 1&2 Ab, 4th Generation: NONREACTIVE

## 2016-05-20 LAB — LIPID PANEL
Cholesterol: 172 mg/dL (ref <200)
HDL: 55 mg/dL (ref >50)
LDL Cholesterol: 101 mg/dL — ABNORMAL HIGH (ref <100)
Total CHOL/HDL Ratio: 3.1 Ratio (ref <5.0)
Triglycerides: 81 mg/dL (ref <150)
VLDL: 16 mg/dL (ref <30)

## 2016-05-20 NOTE — Patient Instructions (Addendum)
It was very nice to meet you! You were seen in clinic today for lab workup and an EKG as requested by your psychiatrist.  Please follow up with your psychiatrist in case changes to your medications need to be made.  Thank you for allowing us to participate in your care.

## 2016-05-20 NOTE — Therapy (Signed)
[facility] [address] [place], [place], [address] Phone: [phone]   Fax:  [phone]  Physical Therapy Treatment  Patient Details  Name: Kelly Carr MRN: [id number] Date of Birth: 12/08/1970 Referring Provider: Clare GandyJeremy Schmitz, MD  Encounter Date: 05/20/2016      PT End of Session - 05/20/16 1152    Visit Number 5   Number of Visits 16   Date for PT Re-Evaluation 07/04/16   PT Start Time 1150   PT Stop Time 1245   PT Time Calculation (min) 55 min      Past Medical History:  Diagnosis Date  . Abnormal Pap smear    Unknown results>colpo>normal  . Anxiety   . Bipolar 1 disorder (HCC)     No past surgical history on file.  There were no vitals filed for this visit.      Subjective Assessment - 05/20/16 1151    Subjective I am starting to tell improvement. I had a good day yesterday.    Currently in Pain? Yes   Pain Score 2    Pain Location Hip   Pain Orientation Right                         [facility] Adult PT Treatment/Exercise - 05/20/16 0001      Knee/Hip Exercises: Stretches   Active Hamstring Stretch 3 reps;30 seconds   Active Hamstring Stretch Limitations shown seated with foot on floor -easier for patient    Piriformis Stretch 3 reps;30 seconds   Piriformis Stretch Limitations figure 4 stretch 3 x 30 sec      Knee/Hip Exercises: Aerobic   Nustep LL3 LE only x 6 minutes     Knee/Hip Exercises: Seated   Sit to Sand 10 reps;without UE support     Knee/Hip Exercises: Supine   Bridges Limitations x 15  with DF   Other Supine Knee/Hip Exercises supine clam x 20 green band    Other Supine Knee/Hip Exercises ball squeeze x 10  Supine 4 way hip x 15 each      Knee/Hip Exercises: Sidelying   Clams rt x 20, reverse clam x 20     Moist Heat Therapy   Number Minutes Moist Heat 15 Minutes   Moist Heat Location Hip                PT Education - 05/20/16 1232    Education  provided Yes   Education Details HEP   Person(s) Educated Patient   Methods Explanation;Handout   Comprehension Verbalized understanding          PT Short Term Goals - 05/04/16 1310      PT SHORT TERM GOAL #1   Title Patient will be independent with initial HEP for strength and mobility    Time 4   Period Weeks   Status New     PT SHORT TERM GOAL #2   Title Pt will report pain less than/equal to 3/10 with ambulation for 15 minutes.    Period Weeks   Status New     PT SHORT TERM GOAL #3   Title Pt will be able to navigate a flight of stairs with single rail support without difficulty.    Baseline moderate difficutly   Period Weeks   Status New           PT Long Term Goals - 05/04/16 1316      PT LONG TERM GOAL #1  Title Pt will improve her FOTO score from 65% limitation to </=50% limitation in order to improve functional mobility.    Time 8   Period Weeks   Status New     PT LONG TERM GOAL #2   Title Pt will imporve her hamstring flexibility in order to perform a SLR to 80 degrees bilaterally in order to improve gait.    Baseline R: 30 degrees, L: 42 degrees.    Time 8   Period Weeks   Status New     PT LONG TERM GOAL #3   Title Paitent will walk 1 mile without increased pain in order to perfrom work tasks    Time 8   Period Weeks   Status New     PT LONG TERM GOAL #4   Title Patient will stand for 1 hour without pain in ordetr to perfrom work tasks    Time 8   Period Weeks   Status New     PT LONG TERM GOAL #5   Title Pt will improve her R LE strength to >/= 4/5 in order to improve gait and functioanl mobility.    Time 8   Period Weeks   Status New               Plan - 05/20/16 1250    Clinical Impression Statement Able to increase repetitions and time on Nustep without c/o increased pain. Began sit to stands and updated HEP.    PT Next Visit Plan Nustep,Gentle  hip stretching/strengthening, hamstring stretches, Pt is guarded with her R  hip and limited by pain. Review sit-stands, try wall slide verses step ups if pt still feeling less pain.    PT Home Exercise Plan hamstring stretches, clam shells,reverse clam shells,  heel slides, 4 way hip on mat, bridge, sit-stand    Consulted and Agree with Plan of Care Patient      Patient will benefit from skilled therapeutic intervention in order to improve the following deficits and impairments:  Decreased range of motion, Abnormal gait, Difficulty walking, Decreased strength, Decreased mobility, Pain, Increased muscle spasms, Decreased safety awareness, Decreased endurance  Visit Diagnosis: Pain in right hip  Stiffness of right hip, not elsewhere classified  Difficulty in walking, not elsewhere classified  Muscle weakness (generalized)  Acute pain of right shoulder  Stiffness of right shoulder, not elsewhere classified     Problem List Patient Active Problem List   Diagnosis Date Noted  . Right shoulder pain 03/08/2016  . Piriformis syndrome of right side 02/23/2016  . Right shoulder injury 02/17/2016  . HLD (hyperlipidemia) 11/12/2015  . Dysuria 05/26/2015  . Normocytic anemia 05/26/2015  . Pain of upper abdomen 05/04/2015  . 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 Right hip pain 12/27/2014  . Pharyngitis, chronic 08/11/2014  . Adjustment disorder with mixed anxiety and depressed mood 10/17/2013  . Irregular menses 07/17/2013  . GERD (gastroesophageal reflux disease) 11/10/2010  . Carpal tunnel syndrome of left wrist 11/10/2010  . Bipolar disorder (HCC) 11/25/2008  . OTH ABNORMAL PAPANICOLAOU SMEAR CERVIX&CERV HPV 11/25/2008  . DEPRESSION 10/21/2008    Sherrie Mustacheonoho, Kameran Mcneese McGee, PTA 05/20/2016, 12:55 PM  [facility] [address] [place], [place], [address] Phone: [phone]   Fax:  [phone]  Name: Reeva Grunder MRN: [id number] Date of Birth:  January 01, 1971

## 2016-05-20 NOTE — Patient Instructions (Signed)
Bridge    Lie back, legs bent. Inhale, pressing hips up. Keeping ribs in, lengthen lower back. Exhale, rolling down along spine from top. Repeat _10___ times. Do _2___ sessions per day.  Sit to Stand / Stand to Sit / Transfers    Sit on edge of a solid chair  feet flat on floor. Lean forward over feet and stand up. Try not to use your hands. . Sit down slowly  Repeat 10____ times per session. Do ___2_ sessions per day.

## 2016-05-20 NOTE — Progress Notes (Signed)
Subjective:   Patient ID: Kelly Carr    DOB: 02/22/1971, 45 y.o. female   MRN: [id number]  CC: follow up  HPI: Kelly Carr is a 45 y.o. female who presents to clinic today for follow up.  Sees a psychiatrist for her history of bipolar disorder and depression.  Is on Geodon, Buproprion, cogentin, buspar, melatonin, B-12.  Has been on same regimen since 2004 and it has been working well for her.  Reports no major side effects.   Her psychiatrist would like an annual EKG to keep an eye on her QTc.  Last EKG was in 2015 with a QTc of [phone].    Additionally, her psychiatrist had previously said her cholesterol was a little high.  Patient reports she does not take Lipitor anymore, and came off it because cholesterol levels were normal.  Has not had labs since 2016 but wants to know if she needs to restart pharmacologic therapy.   No other issues at this time.    ROS: See HPI for pertinent ROS.  PMFSH: Pertinent past medical, surgical, family, and social history were reviewed and updated as appropriate. Smoking status reviewed.  Medications reviewed. Current Outpatient Prescriptions  Medication Sig Dispense Refill  . benztropine (COGENTIN) 0.5 MG tablet Take 0.5 mg by mouth 2 (two) times daily.    Marland Kitchen. buPROPion (WELLBUTRIN XL) 150 MG 24 hr tablet Take 150 mg by mouth daily.    . busPIRone (BUSPAR) 15 MG tablet Take 7.5 mg by mouth 2 (two) times daily.    . cyclobenzaprine (FLEXERIL) 5 MG tablet Take 1 tablet (5 mg total) by mouth 3 (three) times daily as needed for muscle spasms. (Patient not taking: Reported on 04/26/2016) 30 tablet 0  . Melatonin 5 MG TABS Take 1 tablet by mouth at bedtime as needed. For sleep    . meloxicam (MOBIC) 15 MG tablet Take 1 tablet (15 mg total) by mouth daily. 30 tablet 2  . Multiple Vitamins-Calcium (ONE-A-DAY WOMENS FORMULA PO) Take 1 tablet by mouth daily.    . norgestimate-ethinyl estradiol (ORTHO-CYCLEN,SPRINTEC,PREVIFEM) 0.25-35 MG-MCG tablet Take 1  tablet by mouth daily. 1 Package 11  . traZODone (DESYREL) 50 MG tablet Take 25 mg by mouth at bedtime.    . ziprasidone (GEODON) 80 MG capsule Take 80 mg by mouth 2 (two) times daily with a meal.     No current facility-administered medications for this visit.    Objective:   BP 102/66   Pulse 75   Temp 98.2 F (36.8 C) (Oral)   Wt 135 lb (61.2 kg)   LMP 05/06/2016   SpO2 98%   BMI 23.17 kg/m  Vitals and nursing note reviewed.  General: well nourished, well developed, in NAD with non-toxic appearance HEENT: NCAT, MMM Neck: supple, non-tender without LAD CV: RRR, no MRG  Lungs: CTA B/L with normal work of breathing Abdomen: soft, non-tender, no masses or organomegaly palpable, +bs Skin: warm, dry, no rashes or lesions, cap refill < 2 seconds Extremities: warm and well perfused, normal tone Psych: mood appropriate  Assessment & Plan:   45 y/o F presents to clinic for EKG and routine labs.   Bipolar disorder Stable on current medication regimen.  Feels as though her mood is well controlled, reports good compliance.  -EKG today per psychiatrist to recheck QTc -Psych to make adjustments based on result -Continue to follow  Health Maintenance -Lipid panel, CBC, CMET ordered today as no new labs available  -Flu vaccination administered at this visit -  HIV antibody screen completed  Orders Placed This Encounter  Procedures  . Flu Vaccine QUAD 36+ mos IM  . Lipid panel  . CBC  . HIV antibody (with reflex)  . COMPLETE METABOLIC PANEL WITH GFR  . EKG 12-Lead    Freddrick MarchYashika Verlyn Lambert, MD [facility], PGY-1 05/23/2016 5:51 PM

## 2016-05-23 ENCOUNTER — Ambulatory Visit: Payer: BLUE CROSS/BLUE SHIELD | Admitting: Physical Therapy

## 2016-05-23 DIAGNOSIS — M25551 Pain in right hip: Secondary | ICD-10-CM | POA: Diagnosis not present

## 2016-05-23 DIAGNOSIS — R262 Difficulty in walking, not elsewhere classified: Secondary | ICD-10-CM

## 2016-05-23 DIAGNOSIS — M6281 Muscle weakness (generalized): Secondary | ICD-10-CM

## 2016-05-23 DIAGNOSIS — M25651 Stiffness of right hip, not elsewhere classified: Secondary | ICD-10-CM

## 2016-05-23 NOTE — Therapy (Signed)
[facility] [address] [place], [place], [address] Phone: [phone]   Fax:  [phone]  Physical Therapy Treatment  Patient Details  Name: Kelly Carr MRN: [id number] Date of Birth: 02/02/1971 Referring Provider: Clare GandyJeremy Schmitz, MD  Encounter Date: 05/23/2016      PT End of Session - 05/23/16 1252    Visit Number 6   Number of Visits 16   Date for PT Re-Evaluation 07/04/16   PT Start Time 1235   PT Stop Time 1315   PT Time Calculation (min) 40 min   Activity Tolerance Patient tolerated treatment well   Behavior [facility] Therapy [facility] for tasks assessed/performed      Past Medical History:  Diagnosis Date  . Abnormal Pap smear    Unknown results>colpo>normal  . Anxiety   . Bipolar 1 disorder (HCC)     No past surgical history on file.  There were no vitals filed for this visit.      Subjective Assessment - 05/23/16 1247    Subjective Pt arriving today reporting 2/10 pain in R hip more on lateral side. Pt reporting feeling better since starting therapy. Pt also reported anterior hip pain over the weekend after having a busy day.    Pertinent History Histroy of hip pain.    Limitations Standing;Walking;Lifting;House hold activities   How long can you sit comfortably? 30 minutes   Diagnostic tests R hip MRI on 04/26/16: R hamstring tendinois, R gluteal minimus tendinosis, R superior anterior labral tear   Patient Stated Goals Stop hurting, get back to PLOF, sleeping without pain on my r side   Currently in Pain? Yes   Pain Score 2    Pain Location Hip   Pain Orientation Right   Pain Descriptors / Indicators Aching   Pain Onset More than a month ago   Pain Frequency Constant   Aggravating Factors  prolonged activity, working   Pain Relieving Factors ice, meds, rest   Effect of Pain on Daily Activities difficulty working a full shift, household chores   Multiple Pain Sites No                          [facility] Adult PT Treatment/Exercise - 05/23/16 0001      Knee/Hip Exercises: Stretches   Piriformis Stretch 3 reps;30 seconds   Piriformis Stretch Limitations figure 4 stretch 3 x 30 sec      Knee/Hip Exercises: Aerobic   Nustep LL3 LE only x 6 minutes     Knee/Hip Exercises: Seated   Sit to Sand 10 reps;without UE support     Knee/Hip Exercises: Supine   Bridges Limitations 15 reps holding 5 seconds   Other Supine Knee/Hip Exercises supine clam x 20 green band    Other Supine Knee/Hip Exercises ball squeeze x 10  Supine 4 way hip x 15 each      Knee/Hip Exercises: Sidelying   Clams reverse clam  15 reps     Moist Heat Therapy   Number Minutes Moist Heat 15 Minutes   Moist Heat Location Hip                PT Education - 05/23/16 1252    Education Details Continue HEP   Person(s) Educated Patient   Methods Explanation   Comprehension Verbalized understanding          PT Short Term Goals - 05/23/16 1259      PT SHORT TERM GOAL #1  Title Patient will be independent with initial HEP for strength and mobility    Time 4   Period Weeks   Status On-going     PT SHORT TERM GOAL #2   Title Pt will report pain less than/equal to 3/10 with ambulation for 15 minutes.    Time 4   Period Weeks   Status New     PT SHORT TERM GOAL #3   Title Pt will be able to navigate a flight of stairs with single rail support without difficulty.    Baseline moderate difficutly   Time 4   Period Weeks   Status New     PT SHORT TERM GOAL #4   Title Patient will be independent with initial HEP for strength and mobility    Time 4   Period Weeks   Status New     PT SHORT TERM GOAL #5   Title Patient will report 2/10 pain in the shoulder and hip at worst    Time 4   Period Weeks   Status New           PT Long Term Goals - 05/04/16 1316      PT LONG TERM GOAL #1   Title Pt will improve her FOTO score from 65% limitation to </=50%  limitation in order to improve functional mobility.    Time 8   Period Weeks   Status New     PT LONG TERM GOAL #2   Title Pt will imporve her hamstring flexibility in order to perform a SLR to 80 degrees bilaterally in order to improve gait.    Baseline R: 30 degrees, L: 42 degrees.    Time 8   Period Weeks   Status New     PT LONG TERM GOAL #3   Title Paitent will walk 1 mile without increased pain in order to perfrom work tasks    Time 8   Period Weeks   Status New     PT LONG TERM GOAL #4   Title Patient will stand for 1 hour without pain in ordetr to perfrom work tasks    Time 8   Period Weeks   Status New     PT LONG TERM GOAL #5   Title Pt will improve her R LE strength to >/= 4/5 in order to improve gait and functioanl mobility.    Time 8   Period Weeks   Status New               Plan - 05/23/16 1253    Clinical Impression Statement Pt tolerating therapy well tolerated increased time on Nustep today. Pt reporting an increase in her R hip pain from 1/10 to 2/10 after working on Nustep. Pt  still reporting pain during her work shifts. Skilled therapy needed to progress pt to PLOF.    Rehab Potential Good   PT Frequency 2x / week   PT Duration 8 weeks   PT Treatment/Interventions ADLs/Self Care Home Management;Iontophoresis 4mg /ml Dexamethasone;Electrical Stimulation;Cryotherapy;Moist Heat;Ultrasound;Gait training;Functional mobility training;Patient/family education;Neuromuscular re-education;Therapeutic exercise;Therapeutic activities;Manual techniques;Passive range of motion;Dry needling;Traction   PT Next Visit Plan Nustep,Gentle  hip stretching/strengthening, hamstring stretches, Pt is guarded with her R hip and limited by pain.    PT Home Exercise Plan hamstring stretches, clam shells,reverse clam shells,  heel slides, 4 way hip on mat, bridge, sit-stand    Consulted and Agree with Plan of Care Patient      Patient will benefit from skilled therapeutic  intervention in order to improve the following deficits and impairments:  Decreased range of motion, Abnormal gait, Difficulty walking, Decreased strength, Decreased mobility, Pain, Increased muscle spasms, Decreased safety awareness, Decreased endurance  Visit Diagnosis: Pain in right hip  Stiffness of right hip, not elsewhere classified  Difficulty in walking, not elsewhere classified  Muscle weakness (generalized)     Problem List Patient Active Problem List   Diagnosis Date Noted  . Right shoulder pain 03/08/2016  . Piriformis syndrome of right side 02/23/2016  . Right shoulder injury 02/17/2016  . HLD (hyperlipidemia) 11/12/2015  . Dysuria 05/26/2015  . Normocytic anemia 05/26/2015  . Pain of upper abdomen 05/04/2015  . 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 Right hip pain 12/27/2014  . Pharyngitis, chronic 08/11/2014  . Adjustment disorder with mixed anxiety and depressed mood 10/17/2013  . Irregular menses 07/17/2013  . GERD (gastroesophageal reflux disease) 11/10/2010  . Carpal tunnel syndrome of left wrist 11/10/2010  . Bipolar disorder (HCC) 11/25/2008  . OTH ABNORMAL PAPANICOLAOU SMEAR CERVIX&CERV HPV 11/25/2008  . DEPRESSION 10/21/2008    Sharmon Leyden, MPT  05/23/2016, 1:02 PM  [facility] [address] [place], [place], [address] Phone: [phone]   Fax:  [phone]  Name: Tyaira Smola MRN: [id number] Date of Birth: May 30, 1971

## 2016-05-23 NOTE — Assessment & Plan Note (Signed)
>>  ASSESSMENT AND PLAN FOR SEVERE BIPOLAR I DISORDER, CURRENT OR MOST RECENT EPISODE MIXED (HCC) WRITTEN ON 05/23/2016  5:50 PM BY Freddrick MarchAMIN, YASHIKA, MD  Stable on current medication regimen.  Feels as though her mood is well controlled, reports good compliance.  -EKG today per psychiatrist to recheck QTc -Psych to make adjustments based on result -Continue to follow

## 2016-05-23 NOTE — Assessment & Plan Note (Signed)
Stable on current medication regimen.  Feels as though her mood is well controlled, reports good compliance.  -EKG today per psychiatrist to recheck QTc -Psych to make adjustments based on result -Continue to follow

## 2016-05-25 ENCOUNTER — Ambulatory Visit: Payer: BLUE CROSS/BLUE SHIELD | Admitting: Physical Therapy

## 2016-06-02 ENCOUNTER — Ambulatory Visit: Payer: BLUE CROSS/BLUE SHIELD | Admitting: Physical Therapy

## 2016-06-02 DIAGNOSIS — M25551 Pain in right hip: Secondary | ICD-10-CM

## 2016-06-02 DIAGNOSIS — M25651 Stiffness of right hip, not elsewhere classified: Secondary | ICD-10-CM

## 2016-06-02 DIAGNOSIS — R262 Difficulty in walking, not elsewhere classified: Secondary | ICD-10-CM

## 2016-06-02 DIAGNOSIS — M6281 Muscle weakness (generalized): Secondary | ICD-10-CM

## 2016-06-02 NOTE — Therapy (Addendum)
[place], [place], [address] Phone: [phone]   Fax:  [phone]  Physical Therapy Treatment/Discharge Note  Patient Details  Name: Kelly Carr MRN: [id number] Date of Birth: 09-04-1970 Referring Provider: Clearance Coots, MD  Encounter Date: 06/02/2016      PT End of Session - 06/02/16 1548    Visit Number 7   Number of Visits 16   Date for PT Re-Evaluation 07/04/16   PT Start Time 0346   PT Stop Time 0445   PT Time Calculation (min) 59 min   Activity Tolerance Patient tolerated treatment well   Behavior During Therapy [facility] for tasks assessed/performed      Past Medical History:  Diagnosis Date  . Abnormal Pap smear    Unknown results>colpo>normal  . Anxiety   . Bipolar 1 disorder ([place])     No past surgical history on file.  There were no vitals filed for this visit.      Subjective Assessment - 06/02/16 1549    Subjective Pt arriving today reporting 2/10 pain in R hip laterally.  Pt injection was in the anterior hip.  Pt continues to feel better with therapy.  No anterior pain today. I work at [employer] and I am on my feet all day   Pertinent History Histroy of hip pain.    Limitations Standing;Walking;Lifting;House hold activities   How long can you sit comfortably? 30 minutes   Diagnostic tests R hip MRI on 04/26/16: R hamstring tendinois, R gluteal minimus tendinosis, R superior anterior labral tear   Patient Stated Goals Stop hurting, get back to PLOF, sleeping without pain on my r side   Currently in Pain? Yes   Pain Score 2    Pain Location Hip   Pain Orientation Right   Pain Descriptors / Indicators Aching   Pain Type Chronic pain   Pain Onset More than a month ago   Pain Frequency Intermittent                         [facility] Adult PT Treatment/Exercise - 06/02/16 1640      Self-Care   Self-Care Other Self-Care Comments   Other Self-Care Comments   education on trigger point dry needling and aftercare/precautions     Knee/Hip Exercises: Stretches   Passive Hamstring Stretch 3 reps  right   Passive Hamstring Stretch Limitations using strap   Piriformis Stretch 3 reps;30 seconds  1 rep after TDN with 50 % greater ease   Piriformis Stretch Limitations figure 4 stretch 3 x 30 sec      Moist Heat Therapy   Number Minutes Moist Heat 15 Minutes   Moist Heat Location Hip     Ultrasound   Ultrasound Location right hip post greater trochanter   Ultrasound Parameters 1.6 w/cm2 for 8 minutes   Ultrasound Goals Pain  stretch of pirifomis     Manual Therapy   Manual Therapy Soft tissue mobilization;Myofascial release   Manual therapy comments trigger point therapy for Right piriformis   Soft tissue mobilization Right hamstring and piriformis with IASTYM tool   Myofascial Release piriformis with IR/ER assisted stretch                PT Education - 06/02/16 1646    Education provided Yes   Education Details Educated on precautians for Trigger point dry needling and aftercare,  hamstring stretch and modified piriformins. with Right knee directly over left for  added stretch, [place]   Person(s) Educated Patient   Methods Explanation;Demonstration;Handout   Comprehension Verbalized understanding;Returned demonstration          PT Short Term Goals - 05/23/16 1259      PT SHORT TERM GOAL #1   Title Patient will be independent with initial HEP for strength and mobility    Time 4   Period Weeks   Status On-going     PT SHORT TERM GOAL #2   Title Pt will report pain less than/equal to 3/10 with ambulation for 15 minutes.    Time 4   Period Weeks   Status New     PT SHORT TERM GOAL #3   Title Pt will be able to navigate a flight of stairs with single rail support without difficulty.    Baseline moderate difficutly   Time 4   Period Weeks   Status New     PT SHORT TERM GOAL #4   Title Patient will be independent with  initial HEP for strength and mobility    Time 4   Period Weeks   Status New     PT SHORT TERM GOAL #5   Title Patient will report 2/10 pain in the shoulder and hip at worst    Time 4   Period Weeks   Status New           PT Long Term Goals - 05/04/16 1316      PT LONG TERM GOAL #1   Title Pt will improve her FOTO score from 65% limitation to </=50% limitation in order to improve functional mobility.    Time 8   Period Weeks   Status New     PT LONG TERM GOAL #2   Title Pt will imporve her hamstring flexibility in order to perform a SLR to 80 degrees bilaterally in order to improve gait.    Baseline R: 30 degrees, L: 42 degrees.    Time 8   Period Weeks   Status New     PT LONG TERM GOAL #3   Title Paitent will walk 1 mile without increased pain in order to perfrom work tasks    Time 8   Period Weeks   Status New     PT LONG TERM GOAL #4   Title Patient will stand for 1 hour without pain in ordetr to perfrom work tasks    Time 8   Period Weeks   Status New     PT LONG TERM GOAL #5   Title Pt will improve her R LE strength to >/= 4/5 in order to improve gait and functioanl mobility.    Time 8   Period Weeks   Status New               Plan - 06/02/16 1631    Clinical Impression Statement PT improving with exercises but presents with R lateral hip trigger point pain and consented to trigger point dry needling.  Pt was monitored throughout and pt tolerated well.  Pt was able to bring right ankle up to left knee without illicitng pain in right hip after treatment.    Rehab Potential Good   PT Frequency 2x / week   PT Duration 8 weeks   PT Treatment/Interventions ADLs/Self Care Home Management;Iontophoresis 50m/ml Dexamethasone;Electrical Stimulation;Cryotherapy;Moist Heat;Ultrasound;Gait training;Functional mobility training;Patient/family education;Neuromuscular re-education;Therapeutic exercise;Therapeutic activities;Manual techniques;Passive range of  motion;Dry needling;Traction   PT Next Visit Plan  Assess TDN benefit. Nustep,Gentle  hip stretching/strengthening, hamstring stretches,  Pt is guarded with her R hip and limited by pain. may utilize iontophoresis if pain is not improved with exercises, TDN alone   PT Home Exercise Plan hamstring stretches, clam shells,reverse clam shells,  heel slides, 4 way hip on mat, bridge, sit-stand added, hamstring stretch in supine with strap   Consulted and Agree with Plan of Care Patient      Patient will benefit from skilled therapeutic intervention in order to improve the following deficits and impairments:  Decreased range of motion, Abnormal gait, Difficulty walking, Decreased strength, Decreased mobility, Pain, Increased muscle spasms, Decreased safety awareness, Decreased endurance  Visit Diagnosis: Pain in right hip  Stiffness of right hip, not elsewhere classified  Difficulty in walking, not elsewhere classified  Muscle weakness (generalized)     Problem List Patient Active Problem List   Diagnosis Date Noted  . Right shoulder pain 03/08/2016  . Piriformis syndrome of right side 02/23/2016  . Right shoulder injury 02/17/2016  . HLD (hyperlipidemia) 11/12/2015  . Dysuria 05/26/2015  . Normocytic anemia 05/26/2015  . Pain of upper abdomen 05/04/2015  . 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 Right hip pain 12/27/2014  . Pharyngitis, chronic 08/11/2014  . Adjustment disorder with mixed anxiety and depressed mood 10/17/2013  . Irregular menses 07/17/2013  . GERD (gastroesophageal reflux disease) 11/10/2010  . Carpal tunnel syndrome of left wrist 11/10/2010  . Bipolar disorder ([place]) 11/25/2008  . OTH ABNORMAL PAPANICOLAOU SMEAR CERVIX&CERV HPV 11/25/2008  . DEPRESSION 10/21/2008    Voncille Lo, PT Exercise Expert for the Aging Adult  06/02/16 4:47 PM Phone: [phone] Fax: [facility] [address] [place], [place], [address] Phone: [phone]   Fax:  [phone]  Name: Kelly Carr MRN: [id number] Date of Birth: 02-Jan-1971   PHYSICAL THERAPY DISCHARGE SUMMARY  Visits from Start of Care: 7  Current functional level related to goals / functional outcomes: unknown   Remaining deficits: unknown   Education / Equipment: Initial HEP Plan:                                                    Patient goals were not met. Patient is being discharged due to not returning since the last visit.  ?????    Pt had relief after this visit but never returned to the clinic.   Voncille Lo, PT Certified Exercise Expert for the Aging Adult  08/09/16 12:11 PM Phone: [phone] Fax: [phone]

## 2016-06-02 NOTE — Patient Instructions (Addendum)
Trigger Point Dry Needling  . What is Trigger Point Dry Needling (DN)? o DN is a physical therapy technique used to treat muscle pain and dysfunction. Specifically, DN helps deactivate muscle trigger points (muscle knots).  o A thin filiform needle is used to penetrate the skin and stimulate the underlying trigger point. The goal is for a local twitch response (LTR) to occur and for the trigger point to relax. No medication of any kind is injected during the procedure.   . What Does Trigger Point Dry Needling Feel Like?  o The procedure feels different for each individual patient. Some patients report that they do not actually feel the needle enter the skin and overall the process is not painful. Very mild bleeding may occur. However, many patients feel a deep cramping in the muscle in which the needle was inserted. This is the local twitch response.   Marland Kitchen. How Will I feel after the treatment? o Soreness is normal, and the onset of soreness may not occur for a few hours. Typically this soreness does not last longer than two days.  o Bruising is uncommon, however; ice can be used to decrease any possible bruising.  o In rare cases feeling tired or nauseous after the treatment is normal. In addition, your symptoms may get worse before they get better, this period will typically not last longer than 24 hours.   . What Can I do After My Treatment? o Increase your hydration by drinking more water for the next 24 hours. o You may place ice or heat on the areas treated that have become sore, however, do not use heat on inflamed or bruised areas. Heat often brings more relief post needling. o You can continue your regular activities, but vigorous activity is not recommended initially after the treatment for 24 hours. o DN is best combined with other physical therapy such as strengthening, stretching, and other therapies.     Hamstring Step 3   Left leg in maximal straight leg raise, heel at maximal  stretch, straighten knee further by tightening knee cap.  Do not grimace with the stretch.  A Nice sustained stretch is enough.  No crying with stretching.Stay within tolerance. Hold 30___ seconds. Relax knee cap only. Repeat _3__ times.  Your Right side is more tight.        Garen LahLawrie Brielyn Bosak, PT Exercise Expert for the Aging Adult  06/02/16 3:57 PM Phone: [phone] Fax: [phone]

## 2016-06-03 ENCOUNTER — Ambulatory Visit: Payer: BLUE CROSS/BLUE SHIELD | Admitting: Physical Therapy

## 2016-06-20 ENCOUNTER — Ambulatory Visit (INDEPENDENT_AMBULATORY_CARE_PROVIDER_SITE_OTHER): Payer: BLUE CROSS/BLUE SHIELD | Admitting: Obstetrics and Gynecology

## 2016-06-20 ENCOUNTER — Encounter: Payer: Self-pay | Admitting: Obstetrics and Gynecology

## 2016-06-20 VITALS — BP 100/60 | HR 80 | Temp 98.4°F | Wt 142.0 lb

## 2016-06-20 DIAGNOSIS — B349 Viral infection, unspecified: Secondary | ICD-10-CM | POA: Diagnosis not present

## 2016-06-20 NOTE — Progress Notes (Signed)
   Subjective:   Patient ID: Kelly Carr, female    DOB: August 24, 1970, 46 y.o.   MRN: [id number]  Patient presents for Same Day Appointment  Chief Complaint  Patient presents with  . Nasal Congestion    HPI: # Nasal Congestion Has been having symptoms of nasal congestion, cough, mild body aches, runny nose, and sneezing for about 3 days. Son recently diagnosed with the flu last week. Has been using OTC cold and flu medicine but not helping much. Denies any fevers. Received flu vaccine this year.   Flu Risk Factors Headache: no  Severe fatigue: no  Dyspnea: no  Rash: no   Review of Systems   See HPI for ROS.   History  Smoking Status  . Former Smoker  . Types: Cigarettes  Smokeless Tobacco  . Never Used    Past medical history, surgical, family, and social history reviewed and updated in the EMR as appropriate.  Objective:  BP 100/60   Pulse 80   Temp 98.4 F (36.9 C) (Oral)   Wt 142 lb (64.4 kg)   LMP 06/06/2016   SpO2 98%   BMI 24.37 kg/m  Vitals and nursing note reviewed  Physical Exam  Constitutional: She is well-developed, well-nourished, and in no distress.  HENT:  Nose: Nose normal.  Mouth/Throat: Oropharynx is clear and moist.  Eyes: Conjunctivae and EOM are normal. Pupils are equal, round, and reactive to light.  Neck: Normal range of motion. Neck supple.  Cardiovascular: Normal rate, regular rhythm and normal heart sounds.   Pulmonary/Chest: Effort normal and breath sounds normal. She has no wheezes. She has no rales.  Lymphadenopathy:    She has no cervical adenopathy.  Skin: No rash noted.    Assessment & Plan:  1. Viral illness Symptoms consistent with viral illness/flu-like illness. Flu is less likely on the differential as patient is afebrile and well appearing. Conservative measures. Return precautions discussed.    PATIENT EDUCATION PROVIDED: See AVS   Caryl AdaJazma Phelps, DO 06/20/2016, 3:44 PM PGY-3, [facility]

## 2016-06-20 NOTE — Patient Instructions (Signed)
Upper Respiratory Infection, Adult Most upper respiratory infections (URIs) are a viral infection of the air passages leading to the lungs. A URI affects the nose, throat, and upper air passages. The most common type of URI is nasopharyngitis and is typically referred to as "the common cold." URIs run their course and usually go away on their own. Most of the time, a URI does not require medical attention, but sometimes a bacterial infection in the upper airways can follow a viral infection. This is called a secondary infection. Sinus and middle ear infections are common types of secondary upper respiratory infections. Bacterial pneumonia can also complicate a URI. A URI can worsen asthma and chronic obstructive pulmonary disease (COPD). Sometimes, these complications can require emergency medical care and may be life threatening. What are the causes? Almost all URIs are caused by viruses. A virus is a type of germ and can spread from one person to another. What increases the risk? You may be at risk for a URI if:  You smoke.  You have chronic heart or lung disease.  You have a weakened defense (immune) system.  You are very young or very old.  You have nasal allergies or asthma.  You work in crowded or poorly ventilated areas.  You work in health care facilities or schools.  What are the signs or symptoms? Symptoms typically develop 2-3 days after you come in contact with a cold virus. Most viral URIs last 7-10 days. However, viral URIs from the influenza virus (flu virus) can last 14-18 days and are typically more severe. Symptoms may include:  Runny or stuffy (congested) nose.  Sneezing.  Cough.  Sore throat.  Headache.  Fatigue.  Fever.  Loss of appetite.  Pain in your forehead, behind your eyes, and over your cheekbones (sinus pain).  Muscle aches.  How is this diagnosed? Your health care provider may diagnose a URI by:  Physical exam.  Tests to check that your  symptoms are not due to another condition such as: ? Strep throat. ? Sinusitis. ? Pneumonia. ? Asthma.  How is this treated? A URI goes away on its own with time. It cannot be cured with medicines, but medicines may be prescribed or recommended to relieve symptoms. Medicines may help:  Reduce your fever.  Reduce your cough.  Relieve nasal congestion.  Follow these instructions at home:  Take medicines only as directed by your health care provider.  Gargle warm saltwater or take cough drops to comfort your throat as directed by your health care provider.  Use a warm mist humidifier or inhale steam from a shower to increase air moisture. This may make it easier to breathe.  Drink enough fluid to keep your urine clear or pale yellow.  Eat soups and other clear broths and maintain good nutrition.  Rest as needed.  Return to work when your temperature has returned to normal or as your health care provider advises. You may need to stay home longer to avoid infecting others. You can also use a face mask and careful hand washing to prevent spread of the virus.  Increase the usage of your inhaler if you have asthma.  Do not use any tobacco products, including cigarettes, chewing tobacco, or electronic cigarettes. If you need help quitting, ask your health care provider. How is this prevented? The best way to protect yourself from getting a cold is to practice good hygiene.  Avoid oral or hand contact with people with cold symptoms.  Wash your   hands often if contact occurs.  There is no clear evidence that vitamin C, vitamin E, echinacea, or exercise reduces the chance of developing a cold. However, it is always recommended to get plenty of rest, exercise, and practice good nutrition. Contact a health care provider if:  You are getting worse rather than better.  Your symptoms are not controlled by medicine.  You have chills.  You have worsening shortness of breath.  You have  brown or red mucus.  You have yellow or brown nasal discharge.  You have pain in your face, especially when you bend forward.  You have a fever.  You have swollen neck glands.  You have pain while swallowing.  You have white areas in the back of your throat. Get help right away if:  You have severe or persistent: ? Headache. ? Ear pain. ? Sinus pain. ? Chest pain.  You have chronic lung disease and any of the following: ? Wheezing. ? Prolonged cough. ? Coughing up blood. ? A change in your usual mucus.  You have a stiff neck.  You have changes in your: ? Vision. ? Hearing. ? Thinking. ? Mood. This information is not intended to replace advice given to you by your health care provider. Make sure you discuss any questions you have with your health care provider. Document Released: 11/16/2000 Document Revised: 01/24/2016 Document Reviewed: 08/28/2013 Elsevier Interactive Patient Education  2017 Elsevier Inc.  

## 2016-08-30 ENCOUNTER — Ambulatory Visit (INDEPENDENT_AMBULATORY_CARE_PROVIDER_SITE_OTHER): Payer: BLUE CROSS/BLUE SHIELD | Admitting: Obstetrics and Gynecology

## 2016-08-30 ENCOUNTER — Encounter: Payer: Self-pay | Admitting: Obstetrics and Gynecology

## 2016-08-30 VITALS — BP 112/68 | HR 88 | Temp 98.3°F | Ht 64.0 in | Wt 148.0 lb

## 2016-08-30 DIAGNOSIS — L600 Ingrowing nail: Secondary | ICD-10-CM | POA: Diagnosis not present

## 2016-08-30 DIAGNOSIS — R1010 Upper abdominal pain, unspecified: Secondary | ICD-10-CM | POA: Diagnosis not present

## 2016-08-30 MED ORDER — FAMOTIDINE 40 MG PO TABS: 40.0000 mg | ORAL_TABLET | Freq: Every day | ORAL | 2 refills | Status: DC

## 2016-08-30 NOTE — Patient Instructions (Signed)
Ingrown Toenail An ingrown toenail occurs when the corner or sides of your toenail grow into the surrounding skin. The big toe is most commonly affected, but it can happen to any of your toes. If your ingrown toenail is not treated, you will be at risk for infection. What are the causes? This condition may be caused by:  Wearing shoes that are too small or tight.  Injury or trauma, such as stubbing your toe or having your toe stepped on.  Improper cutting or care of your toenails.  Being born with (congenital) nail or foot abnormalities, such as having a nail that is too big for your toe. What increases the risk? Risk factors for an ingrown toenail include:  Age. Your nails tend to thicken as you get older, so ingrown nails are more common in older people.  Diabetes.  Cutting your toenails incorrectly.  Blood circulation problems. What are the signs or symptoms? Symptoms may include:  Pain, soreness, or tenderness.  Redness.  Swelling.  Hardening of the skin surrounding the toe. Your ingrown toenail may be infected if there is fluid, pus, or drainage. How is this diagnosed? An ingrown toenail may be diagnosed by medical history and physical exam. If your toenail is infected, your health care provider may test a sample of the drainage. How is this treated? Treatment depends on the severity of your ingrown toenail. Some ingrown toenails may be treated at home. More severe or infected ingrown toenails may require surgery to remove all or part of the nail. Infected ingrown toenails may also be treated with antibiotic medicines. Follow these instructions at home:  If you were prescribed an antibiotic medicine, finish all of it even if you start to feel better.  Soak your foot in warm soapy water for 20 minutes, 3 times per day or as directed by your health care provider.  Carefully lift the edge of the nail away from the sore skin by wedging a small piece of cotton under the  corner of the nail. This may help with the pain. Be careful not to cause more injury to the area.  Wear shoes that fit well. If your ingrown toenail is causing you pain, try wearing sandals, if possible.  Trim your toenails regularly and carefully. Do not cut them in a curved shape. Cut your toenails straight across. This prevents injury to the skin at the corners of the toenail.  Keep your feet clean and dry.  If you are having trouble walking and are given crutches by your health care provider, use them as directed.  Do not pick at your toenail or try to remove it yourself.  Take medicines only as directed by your health care provider.  Keep all follow-up visits as directed by your health care provider. This is important. Contact a health care provider if:  Your symptoms do not improve with treatment. Get help right away if:  You have red streaks that start at your foot and go up your leg.  You have a fever.  You have increased redness, swelling, or pain.  You have fluid, blood, or pus coming from your toenail. This information is not intended to replace advice given to you by your health care provider. Make sure you discuss any questions you have with your health care provider. Document Released: 05/20/2000 Document Revised: 10/23/2015 Document Reviewed: 04/16/2014 Elsevier Interactive Patient Education  2017 Elsevier Inc.  

## 2016-08-30 NOTE — Progress Notes (Signed)
   Subjective:   Patient ID: Kelly Carr, female    DOB: 03-01-1971, 46 y.o.   MRN: [id number]  Patient presents for Same Day Appointment  Chief Complaint  Patient presents with  . Toe Pain  . Abdominal Cramping    HPI: # Toe Pain Has been having toe pain since last Thursday She went to get a pedicure and since then has had toe pain Nail salon was trying to clip her ingrown toenail Has associated redness and pain but this has improved Location: right great toe Denies fevers, discharge from toe, pain with shoes  #Abdominal Pain Intermitent  Mostly in the upper abdomen Pain will last only a few seconds No associated fever, n/v/d h/o reflux and believes her reflux has been worsening lately  Review of Systems   See HPI for ROS.   History  Smoking Status  . Former Smoker  . Types: Cigarettes  Smokeless Tobacco  . Never Used    Past medical history, surgical, family, and social history reviewed and updated in the EMR as appropriate.   Objective:  BP 112/68   Pulse 88   Temp 98.3 F (36.8 C) (Oral)   Ht 5\' 4"  (1.626 m)   Wt 148 lb (67.1 kg)   LMP 08/15/2016 (Approximate)   SpO2 99%   BMI 25.40 kg/m  Vitals and nursing note reviewed  Physical Exam  Constitutional: She is well-developed, well-nourished, and in no distress.  Cardiovascular: Normal rate.   Pulmonary/Chest: Effort normal.  Abdominal: Soft. Normal appearance and bowel sounds are normal. She exhibits no distension. There is tenderness in the right upper quadrant and epigastric area. There is no guarding.  Musculoskeletal:  R and left great toe with medial aspect of toenail curving into skin. R. Great tow with some tenderness to palpation. Minimal surrounding erythema.     Assessment & Plan:  Please see separate assessment and plan   Meds ordered this encounter  Medications  . famotidine (PEPCID) 40 MG tablet    Sig: Take 1 tablet (40 mg total) by mouth daily.    Dispense:  30 tablet   Refill:  2    Diagnosis and plan along with any newly prescribed medication(s) were discussed in detail with this patient today. The patient verbalized understanding and agreed with the plan. Patient advised if symptoms worsen return to clinic.   PATIENT EDUCATION PROVIDED: See AVS   Kelly AdaJazma Nedda Gains, DO 08/30/2016, 4:38 PM PGY-3, [facility]

## 2016-09-03 DIAGNOSIS — L600 Ingrowing nail: Secondary | ICD-10-CM | POA: Insufficient documentation

## 2016-09-03 NOTE — Assessment & Plan Note (Signed)
Unremarkable exam. No red flags. Appears to be related to her GERD. May also have element of gastritis. Used to be on omeprazole. Will give Rx for Pepcid to help with symtpoms. If this doesn't help may need to escalate up to PPI. Discussed avoidance of NSAIDs. Patient to follow-up with PCP.

## 2016-09-03 NOTE — Assessment & Plan Note (Signed)
On right great toe. No signs of infection. Patient to schedule appointment ASAP to get removed. No urgent need for removal at this time.

## 2016-10-19 ENCOUNTER — Telehealth: Payer: Self-pay | Admitting: *Deleted

## 2016-10-19 DIAGNOSIS — B3731 Acute candidiasis of vulva and vagina: Secondary | ICD-10-CM

## 2016-10-19 DIAGNOSIS — B373 Candidiasis of vulva and vagina: Secondary | ICD-10-CM

## 2016-10-19 MED ORDER — FLUCONAZOLE 150 MG PO TABS: 150.0000 mg | ORAL_TABLET | Freq: Once | ORAL | 0 refills | Status: AC

## 2016-10-19 NOTE — Telephone Encounter (Signed)
-----   Message from Lindell SparHeather L Bacon, [place] sent at 10/19/2016  8:17 AM EDT ----- Regarding: Rx request for Diflucan Contact: [phone] Pt is requesting some Diflucan after trying Monistat 7 for a couple of days, [facility]

## 2016-10-19 NOTE — Telephone Encounter (Signed)
Pt is c/o symptoms related to a yeast infection, has tried OTC Monistat and symptoms persist, Diflucan sent to pharmacy.

## 2016-11-23 ENCOUNTER — Ambulatory Visit (INDEPENDENT_AMBULATORY_CARE_PROVIDER_SITE_OTHER): Payer: BLUE CROSS/BLUE SHIELD | Admitting: Family Medicine

## 2016-11-23 ENCOUNTER — Encounter: Payer: Self-pay | Admitting: Family Medicine

## 2016-11-23 VITALS — BP 118/70 | HR 82 | Temp 97.4°F | Ht 64.0 in | Wt 147.0 lb

## 2016-11-23 DIAGNOSIS — R0789 Other chest pain: Secondary | ICD-10-CM

## 2016-11-23 DIAGNOSIS — R300 Dysuria: Secondary | ICD-10-CM | POA: Diagnosis not present

## 2016-11-23 DIAGNOSIS — N926 Irregular menstruation, unspecified: Secondary | ICD-10-CM | POA: Diagnosis not present

## 2016-11-23 DIAGNOSIS — K219 Gastro-esophageal reflux disease without esophagitis: Secondary | ICD-10-CM | POA: Diagnosis not present

## 2016-11-23 DIAGNOSIS — R079 Chest pain, unspecified: Secondary | ICD-10-CM | POA: Insufficient documentation

## 2016-11-23 DIAGNOSIS — R1010 Upper abdominal pain, unspecified: Secondary | ICD-10-CM

## 2016-11-23 LAB — POCT URINALYSIS DIP (MANUAL ENTRY)
Bilirubin, UA: NEGATIVE
Glucose, UA: NEGATIVE mg/dL
Ketones, POC UA: NEGATIVE mg/dL
Nitrite, UA: NEGATIVE
Spec Grav, UA: 1.02 (ref 1.010–1.025)
Urobilinogen, UA: 0.2 E.U./dL
pH, UA: 6.5 (ref 5.0–8.0)

## 2016-11-23 LAB — POCT URINE PREGNANCY: Preg Test, Ur: NEGATIVE

## 2016-11-23 LAB — POCT UA - MICROSCOPIC ONLY

## 2016-11-23 MED ORDER — OMEPRAZOLE MAGNESIUM 20 MG PO TBEC: 20.0000 mg | DELAYED_RELEASE_TABLET | Freq: Every day | ORAL | 3 refills | Status: DC

## 2016-11-23 MED ORDER — SULFAMETHOXAZOLE-TRIMETHOPRIM 800-160 MG PO TABS: 1.0000 | ORAL_TABLET | Freq: Two times a day (BID) | ORAL | 0 refills | Status: DC

## 2016-11-23 NOTE — Assessment & Plan Note (Signed)
Unclear etiology, but would consider stress testing given her family history if persists with treatment of GERD and keep symptom diary to see what exacerbates and relieves her pain.

## 2016-11-23 NOTE — Assessment & Plan Note (Signed)
Change back to Prilosec--stop Pepcid

## 2016-11-23 NOTE — Patient Instructions (Signed)

## 2016-11-23 NOTE — Assessment & Plan Note (Signed)
UPT is negative, continue OC's.

## 2016-11-23 NOTE — Progress Notes (Signed)
Subjective:    Patient ID: Kelly Carr is a 46 y.o. female presenting with UTI symptoms (frequency, low back pain, dysuria) and stomach issue (pepcid not helping)  on 11/23/2016  HPI: Returns today with multiple complaints. 1. Feels like she has a UTI x several days. Feels burning with urination. Has hesitancy, frequency. Denies fever, chills, nausea or vomiting. Has seen blood when wiping. LMP 5/26--on OC's.  2. Abdominal pain--seen several months ago. H/o GERD and previously treated with prilosec. Recently seen and placed on Pepcid which is not helping. Notes that she has constant pain, feels nauseous and bloated particularly after eating. Not having regular bowel movements, but unchanged. No blood in stool.  3. Reports some chest heaviness x 4 months. IT does come and go. Mom required open heart surgery in her late 59s. No regular exercise. Normal lipids in 05/2016. Quit smoking in 2001. Reports strong family history of heart disease. Reports some shortness of breath. Chest heaviness is not related to diet or exercise. Pain radiates to between her shoulder blades in her back. Recently restarted OC's. Normal EKG in 05/2016 (on Geodon)  4. Reports right sided hand numbness. Has h/o carpal tunnel.  Review of Systems  Constitutional: Negative for chills and fever.  Respiratory: Positive for chest tightness. Negative for shortness of breath.   Cardiovascular: Negative for chest pain and leg swelling.  Gastrointestinal: Positive for abdominal pain. Negative for blood in stool, constipation, diarrhea, nausea and vomiting.  Genitourinary: Positive for dysuria and hematuria. Negative for vaginal bleeding.  Skin: Negative for rash.  Neurological: Positive for numbness.      Objective:    BP 118/70   Pulse 82   Temp 97.4 F (36.3 C) (Oral)   Ht 5\' 4"  (1.626 m)   Wt 147 lb (66.7 kg)   LMP 10/29/2016   SpO2 98%   BMI 25.23 kg/m  Physical Exam  Constitutional: She is oriented to  person, place, and time. She appears well-developed and well-nourished. No distress.  HENT:  Head: Normocephalic and atraumatic.  Eyes: No scleral icterus.  Neck: Neck supple.  Cardiovascular: Normal rate.   Pulmonary/Chest: Effort normal.  Abdominal: Soft.  Neurological: She is alert and oriented to person, place, and time.  Skin: Skin is warm and dry.  Psychiatric: She has a normal mood and affect.   Urinalysis    Component Value Date/Time   COLORURINE Yellow 11/19/2013 0126   COLORURINE YELLOW 07/23/2012 1213   APPEARANCEUR Clear 11/19/2013 0126   LABSPEC 1.019 11/19/2013 0126   PHURINE 6.0 11/19/2013 0126   PHURINE 7.0 07/23/2012 1213   GLUCOSEU Negative 11/19/2013 0126   HGBUR Negative 11/19/2013 0126   HGBUR NEGATIVE 07/23/2012 1213   BILIRUBINUR negative 11/23/2016 0942   BILIRUBINUR NEG 01/26/2016 1608   BILIRUBINUR Negative 11/19/2013 0126   KETONESUR negative 11/23/2016 0942   KETONESUR Negative 11/19/2013 0126   KETONESUR NEGATIVE 07/23/2012 1213   PROTEINUR trace (A) 11/23/2016 0942   PROTEINUR 30 01/26/2016 1608   PROTEINUR Negative 11/19/2013 0126   PROTEINUR NEGATIVE 07/23/2012 1213   UROBILINOGEN 0.2 11/23/2016 0942   UROBILINOGEN 0.2 07/23/2012 1213   NITRITE Negative 11/23/2016 0942   NITRITE NEG 01/26/2016 1608   NITRITE Negative 11/19/2013 0126   NITRITE NEGATIVE 07/23/2012 1213   LEUKOCYTESUR Large (3+) (A) 11/23/2016 0942   LEUKOCYTESUR Trace 11/19/2013 0126    Preg Test, Ur Negative Negative           Assessment & Plan:   Problem List Items Addressed  This Visit      Unprioritized   GERD (gastroesophageal reflux disease)    Change back to Prilosec--stop Pepcid      Relevant Medications   omeprazole (PRILOSEC OTC) 20 MG tablet   Irregular menses    UPT is negative, continue OC's.      Relevant Orders   POCT urine pregnancy (Completed)   Pain of upper abdomen    ? Is this related to GERD or gallstones--check u/s      Relevant  Orders   US Abdomen Limited RUQ   Dysuria - Primary    U/a consistent with UTI. Last culture reveals E.coli--will treat for same. Check urine culture.      Relevant Medications   sulfamethoxazole-trimethoprim (BACTRIM DS,SEPTRA DS) 800-160 MG tablet   Other Relevant Orders   POCT urinalysis dipstick (Completed)   POCT UA - Microscopic Only (Completed)   Urine Culture   Chest pain    Unclear etiology, but would consider stress testing given her family history if persists with treatment of GERD and keep symptom diary to see what exacerbates and relieves her pain.         Total face-to-face time with patient: 25 minutes. Over 50% of encounter was spent on counseling and coordination of care. Return in about 3 months (around 02/23/2017).  Reva Boresanya S Tanita Palinkas 11/23/2016 10:08 AM

## 2016-11-23 NOTE — Assessment & Plan Note (Signed)
?   Is this related to GERD or gallstones--check u/s

## 2016-11-23 NOTE — Assessment & Plan Note (Signed)
U/a consistent with UTI. Last culture reveals E.coli--will treat for same. Check urine culture.

## 2016-11-23 NOTE — Progress Notes (Signed)
Kelly Carr

## 2016-11-25 LAB — URINE CULTURE

## 2016-11-29 ENCOUNTER — Ambulatory Visit (HOSPITAL_COMMUNITY): Admission: RE | Admit: 2016-11-29 | Payer: BLUE CROSS/BLUE SHIELD | Source: Ambulatory Visit

## 2016-12-11 ENCOUNTER — Emergency Department (HOSPITAL_COMMUNITY): Payer: BLUE CROSS/BLUE SHIELD

## 2016-12-11 ENCOUNTER — Emergency Department (HOSPITAL_COMMUNITY)
Admission: EM | Admit: 2016-12-11 | Discharge: 2016-12-11 | Disposition: A | Discharge status: Home / Self Care | Payer: BLUE CROSS/BLUE SHIELD | Attending: Emergency Medicine | Admitting: Emergency Medicine

## 2016-12-11 ENCOUNTER — Encounter (HOSPITAL_COMMUNITY): Payer: Self-pay | Admitting: Emergency Medicine

## 2016-12-11 DIAGNOSIS — R079 Chest pain, unspecified: Secondary | ICD-10-CM | POA: Insufficient documentation

## 2016-12-11 DIAGNOSIS — Z7902 Long term (current) use of antithrombotics/antiplatelets: Secondary | ICD-10-CM | POA: Diagnosis not present

## 2016-12-11 DIAGNOSIS — R1084 Generalized abdominal pain: Secondary | ICD-10-CM | POA: Insufficient documentation

## 2016-12-11 DIAGNOSIS — Z87891 Personal history of nicotine dependence: Secondary | ICD-10-CM | POA: Insufficient documentation

## 2016-12-11 DIAGNOSIS — Z79899 Other long term (current) drug therapy: Secondary | ICD-10-CM | POA: Diagnosis not present

## 2016-12-11 LAB — TROPONIN I: Troponin I: <0.03 ng/mL (ref <0.03)

## 2016-12-11 LAB — BASIC METABOLIC PANEL
Anion gap: 6 (ref 5–15)
BUN: 9 mg/dL (ref 6–20)
CO2: 23 mmol/L (ref 22–32)
Calcium: 8.7 mg/dL — ABNORMAL LOW (ref 8.9–10.3)
Chloride: 106 mmol/L (ref 101–111)
Creatinine, Ser: 0.69 mg/dL (ref 0.44–1.00)
GFR calc Af Amer: >60 mL/min (ref >60)
GFR calc non Af Amer: >60 mL/min (ref >60)
Glucose, Bld: 125 mg/dL — ABNORMAL HIGH (ref 65–99)
Potassium: 3.5 mmol/L (ref 3.5–5.1)
Sodium: 135 mmol/L (ref 135–145)

## 2016-12-11 LAB — URINALYSIS, ROUTINE W REFLEX MICROSCOPIC
Bacteria, UA: NONE SEEN
Bilirubin Urine: NEGATIVE
Glucose, UA: NEGATIVE mg/dL
Hgb urine dipstick: NEGATIVE
Ketones, ur: NEGATIVE mg/dL
Nitrite: NEGATIVE
Protein, ur: NEGATIVE mg/dL
Specific Gravity, Urine: 1.024 (ref 1.005–1.030)
pH: 6 (ref 5.0–8.0)

## 2016-12-11 LAB — CBC
HCT: 34.3 % — ABNORMAL LOW (ref 36.0–46.0)
Hemoglobin: 11.3 g/dL — ABNORMAL LOW (ref 12.0–15.0)
MCH: 28.5 pg (ref 26.0–34.0)
MCHC: 32.9 g/dL (ref 30.0–36.0)
MCV: 86.6 fL (ref 78.0–100.0)
Platelets: 265 10*3/uL (ref 150–400)
RBC: 3.96 MIL/uL (ref 3.87–5.11)
RDW: 13.1 % (ref 11.5–15.5)
WBC: 8 10*3/uL (ref 4.0–10.5)

## 2016-12-11 LAB — HEPATIC FUNCTION PANEL
ALT: 17 U/L (ref 14–54)
AST: 18 U/L (ref 15–41)
Albumin: 3.2 g/dL — ABNORMAL LOW (ref 3.5–5.0)
Alkaline Phosphatase: 41 U/L (ref 38–126)
Bilirubin, Direct: <0.1 mg/dL — ABNORMAL LOW (ref 0.1–0.5)
Total Bilirubin: 0.6 mg/dL (ref 0.3–1.2)
Total Protein: 6.1 g/dL — ABNORMAL LOW (ref 6.5–8.1)

## 2016-12-11 LAB — LIPASE, BLOOD: Lipase: 32 U/L (ref 11–51)

## 2016-12-11 LAB — D-DIMER, QUANTITATIVE (NOT AT ARMC): D-Dimer, Quant: 0.3 ug/mL-FEU (ref 0.00–0.50)

## 2016-12-11 LAB — I-STAT TROPONIN, ED: Troponin i, poc: 0 ng/mL (ref 0.00–0.08)

## 2016-12-11 MED ORDER — DOXYCYCLINE HYCLATE 100 MG PO CAPS: 100.0000 mg | ORAL_CAPSULE | Freq: Two times a day (BID) | ORAL | 0 refills | Status: AC

## 2016-12-11 NOTE — ED Notes (Signed)
Pt. Husband upset they've waited so long. Pt. And husband updated that blood work just came back and EDP would be in shortly to review. EDP made aware that patient upset.

## 2016-12-11 NOTE — ED Triage Notes (Signed)
Patient reports central chest pain onset 1 am this morning with mild SOB and intermittent nausea , denies diaphoresis .

## 2016-12-11 NOTE — Discharge Instructions (Signed)
Please take doxycycline twice daily for the next 14 days. Please follow-up with your primary care provider to have your chest x-ray repeated in 1 month. You can use MiraLAX daily to help soften her stool to have regular bowel movements. Once you have a soft bowel movement, you can reduce the amount of MiraLAX here taking every day to avoid having watery stools. You can also try to increase the amount of water and fiber in your diet to help soften her stools.   If he develop new or worsening symptoms including fever, chills, chest pain with exertion, or worsening shortness of breath, please return to the emergency department for reevaluation.

## 2016-12-11 NOTE — ED Provider Notes (Signed)
[facility] DEPT Provider Note   CSN: [id number] Arrival date & time: 12/11/16  0208     History   Chief Complaint Chief Complaint  Patient presents with  . Chest Pain    HPI Kelly Carr is a 46 y.o. female with a h/o of GERD Who presents to the emergency department with intermittent, sharp central pleuritic chest pain that radiates to the right chest that began at ~1 AM and woke her from sleep. She reports a history of similar pain, but reports the pain last night was more severe. Associated symptoms includes mild SOB, intermittent nausea. She denies vomitting, diaphoresis, night sweats, cough, fever, chills, or recent weight loss.   She also complains of chronic epigastric and right upper quadrant pain, which she describes as sharp.   She is well established with her primary care provider who will be scheduling an outpatient cardiac stress test in the near future.   PMH includes Bipolar Disorder. She reports an extensive family history of cardiovascular disease. She is a nonsmoker. She reports social alcohol use. She reports increased stress related to her family recently.   The history is provided by the patient and the spouse. No language interpreter was used.    Past Medical History:  Diagnosis Date  . Abnormal Pap smear    Unknown results>colpo>normal  . Anxiety   . Bipolar 1 disorder [facility])     Patient Active Problem List   Diagnosis Date Noted  . Chest pain 11/23/2016  . Ingrown toenail without infection 09/03/2016  . Right shoulder pain 03/08/2016  . Piriformis syndrome of right side 02/23/2016  . Right shoulder injury 02/17/2016  . HLD (hyperlipidemia) 11/12/2015  . Dysuria 05/26/2015  . Normocytic anemia 05/26/2015  . Pain of upper abdomen 05/04/2015  . 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 Right hip pain 12/27/2014  . Pharyngitis, chronic 08/11/2014  . Adjustment disorder with mixed anxiety and depressed  mood 10/17/2013  . Irregular menses 07/17/2013  . GERD (gastroesophageal reflux disease) 11/10/2010  . Carpal tunnel syndrome of left wrist 11/10/2010  . Bipolar disorder (HCC) 11/25/2008  . OTH ABNORMAL PAPANICOLAOU SMEAR CERVIX&CERV HPV 11/25/2008  . DEPRESSION 10/21/2008    History reviewed. No pertinent surgical history.  OB History    Gravida Para Term Preterm AB Living   4 2 2   2 2    SAB TAB Ectopic Multiple Live Births   2       2       Home Medications    Prior to Admission medications   Medication Sig Start Date End Date Taking? Authorizing Provider  benztropine (COGENTIN) 0.5 MG tablet Take 0.5 mg by mouth 2 (two) times daily.    [provider]  buPROPion (WELLBUTRIN XL) 150 MG 24 hr tablet Take 150 mg by mouth daily.    [provider]  busPIRone (BUSPAR) 15 MG tablet Take 7.5 mg by mouth 2 (two) times daily.    [provider]  cyclobenzaprine (FLEXERIL) 5 MG tablet Take 1 tablet (5 mg total) by mouth 3 (three) times daily as needed for muscle spasms. Patient not taking: Reported on 04/26/2016 02/23/16   McKeag, Janine Ores, MD  doxycycline (VIBRAMYCIN) 100 MG capsule Take 1 capsule (100 mg total) by mouth 2 (two) times daily. 12/11/16 12/25/16  Zaydrian Batta A, PA-C  Melatonin 5 MG TABS Take 1 tablet by mouth at bedtime as needed. For sleep    [provider]  meloxicam (  MOBIC) 15 MG tablet Take 1 tablet (15 mg total) by mouth daily. 02/23/16 02/22/17  McKeag, Janine OresIan D, MD  Multiple Vitamins-Calcium (ONE-A-DAY WOMENS FORMULA PO) Take 1 tablet by mouth daily.    [provider]  norgestimate-ethinyl estradiol (ORTHO-CYCLEN,SPRINTEC,PREVIFEM) 0.25-35 MG-MCG tablet Take 1 tablet by mouth daily. 04/26/16   Constant, Peggy, MD  omeprazole (PRILOSEC OTC) 20 MG tablet Take 1 tablet (20 mg total) by mouth daily. 11/23/16   Reva BoresPratt, Tanya S, MD  sulfamethoxazole-trimethoprim (BACTRIM DS,SEPTRA DS) 800-160 MG tablet Take 1 tablet by mouth 2 (two)  times daily. 11/23/16   Reva BoresPratt, Tanya S, MD  traZODone (DESYREL) 50 MG tablet Take 25 mg by mouth at bedtime.    [provider]  ziprasidone (GEODON) 80 MG capsule Take 80 mg by mouth 2 (two) times daily with a meal.    [provider]    Family History Family History  Problem Relation Age of Onset  . Diabetes Mother   . Hypertension Mother   . Heart disease Mother 1548  . Diabetes Maternal Grandmother   . Heart disease Maternal Grandmother   . Diabetes Maternal Grandfather   . Heart disease Maternal Grandfather     Social History Social History  Substance Use Topics  . Smoking status: Former Smoker    Types: Cigarettes    Quit date: 06/07/1999  . Smokeless tobacco: Never Used  . Alcohol use No     Allergies   Haldol [haloperidol lactate] and Tramadol   Review of Systems Review of Systems  Constitutional: Negative for activity change, chills and fever.  Respiratory: Positive for shortness of breath. Negative for cough.   Cardiovascular: Positive for chest pain. Negative for leg swelling.  Gastrointestinal: Positive for abdominal pain and nausea. Negative for diarrhea and vomiting.  Musculoskeletal: Negative for back pain.  Skin: Negative for rash.   Physical Exam Updated Vital Signs BP 108/67   Pulse 70   Temp 98.1 F (36.7 C) (Oral)   Resp (!) 24   Ht 5\' 4"  (1.626 m)   Wt 66.7 kg (147 lb)   LMP 12/05/2016 (Approximate)   SpO2 99%   BMI 25.23 kg/m   Physical Exam  Constitutional: No distress.  HENT:  Head: Normocephalic.  Eyes: Conjunctivae are normal.  Neck: Neck supple.  Cardiovascular: Normal rate, regular rhythm, normal heart sounds and intact distal pulses.  Exam reveals no gallop and no friction rub.   No murmur heard. Pulmonary/Chest: Effort normal and breath sounds normal. No respiratory distress. She has no wheezes. She has no rales. She exhibits no tenderness.  No reproducible chest wall tenderness.  Abdominal: Soft. Bowel sounds  are normal. She exhibits no distension. There is tenderness. There is no guarding.  Mild TTP in the LLQ. No rebound or guarding.   Neurological: She is alert.  Skin: Skin is warm. No rash noted.  Psychiatric: Her behavior is normal.  Nursing note and vitals reviewed.  ED Treatments / Results  Labs (all labs ordered are listed, but only abnormal results are displayed) Labs Reviewed  BASIC METABOLIC PANEL - Abnormal; Notable for the following:       Result Value   Glucose, Bld 125 (*)    Calcium 8.7 (*)    All other components within normal limits  CBC - Abnormal; Notable for the following:    Hemoglobin 11.3 (*)    HCT 34.3 (*)    All other components within normal limits  HEPATIC FUNCTION PANEL - Abnormal; Notable for the  following:    Total Protein 6.1 (*)    Albumin 3.2 (*)    Bilirubin, Direct <0.1 (*)    All other components within normal limits  URINALYSIS, ROUTINE W REFLEX MICROSCOPIC - Abnormal; Notable for the following:    APPearance HAZY (*)    Leukocytes, UA LARGE (*)    Squamous Epithelial / LPF 6-30 (*)    All other components within normal limits  TROPONIN I  D-DIMER, QUANTITATIVE (NOT AT [facility])  LIPASE, BLOOD  I-STAT TROPOININ, ED    EKG  EKG Interpretation  Date/Time:  Sunday December 11 2016 02:13:53 EDT Ventricular Rate:  89 PR Interval:  152 QRS Duration: 66 QT Interval:  348 QTC Calculation: 423 R Axis:   67 Text Interpretation:  Normal sinus rhythm Low voltage QRS Borderline ECG No significant change was found Confirmed by Glynn Octave [id number]) on 12/11/2016 6:11:32 AM Also confirmed by Glynn Octave [phone]), editor Misty Stanley [phone])  on 12/11/2016 8:31:15 AM       Radiology Dg Chest 2 View  Result Date: 12/11/2016 CLINICAL DATA:  Acute onset of central chest pain, shortness of breath and nausea. Initial encounter. EXAM: CHEST  2 VIEW COMPARISON:  Chest radiograph performed 12/09/2015 FINDINGS: The lungs are well-aerated. Mild medial  right basilar opacity may reflect pneumonia. There is no evidence of pleural effusion or pneumothorax. The heart is normal in size; the mediastinal contour is within normal limits. No acute osseous abnormalities are seen. IMPRESSION: Mild medial right basilar opacity may reflect pneumonia. Followup PA and lateral chest X-ray is recommended in 3-4 weeks following trial of antibiotic therapy to ensure resolution and exclude underlying malignancy. Electronically Signed   By: Roanna Raider M.D.   On: 12/11/2016 02:57    Procedures Procedures (including critical care time)  Medications Ordered in ED Medications - No data to display   Initial Impression / Assessment and Plan / ED Course  I have reviewed the triage vital signs and the nursing notes.  Pertinent labs & imaging results that were available during my care of the patient were reviewed by me and considered in my medical decision making (see chart for details).     Patient is to be discharged with recommendation to follow up with PCP in regards to today's hospital visit. Chest pain is not likely of cardiac or pulmonary etiology d/t presentation, PERC negative, VSS, no tracheal deviation, no JVD or new murmur, RRR, breath sounds equal bilaterally, EKG without acute abnormalities, and negative troponin. Pt has been advised to return to the ED if CP becomes exertional, associated with diaphoresis or nausea, radiates to left jaw/arm, worsens or becomes concerning in any way. CXR demonstrating a medial right basilar opacity that may reflect pneumonia and a follow-up 2-view CXR is recommended in 3-4 weeks following a trial of ABX to ensure resolution and exclude underlying malignancy. Will d/c the patient on doxycycline with follow up to her PCP for a repeat CXR in 1 month. Pt appears reliable for follow up and is agreeable to discharge.   Case has been discussed with and seen by Dr. Manus Gunning, attending physician, who agrees with the above plan to  discharge.   Final Clinical Impressions(s) / ED Diagnoses   Final diagnoses:  Chest pain, unspecified type    New Prescriptions Discharge Medication List as of 12/11/2016  8:46 AM    START taking these medications   Details  doxycycline (VIBRAMYCIN) 100 MG capsule Take 1 capsule (100 mg total) by mouth 2 (two)  times daily., Starting Sun 12/11/2016, Until Sun 12/25/2016, Print         Kalee Broxton A, PA-C 12/13/16 0024    Glynn Octave, MD 12/17/16 [phone]

## 2017-02-01 ENCOUNTER — Telehealth: Payer: Self-pay | Admitting: Internal Medicine

## 2017-02-01 NOTE — Telephone Encounter (Signed)
Pt called because she was suppose to have a stress test a few months back, but she hadn't met her deductible at that time. She has now met her deductible and would like the referral to have the stress test done. jw

## 2017-02-01 NOTE — Telephone Encounter (Signed)
I would recommend that patient be seen for an appointment to discuss this further. Per chart review, her and Dr. Shawnie PonsPratt discussed that she MAY benefit from a stress test in the future if her symptoms continued. She needs an appointment with Dr. Cathlean CowerMikell before consideration of referral to cardiology for stress testing. Thanks!

## 2017-02-09 ENCOUNTER — Other Ambulatory Visit: Payer: BLUE CROSS/BLUE SHIELD

## 2017-02-14 ENCOUNTER — Encounter: Payer: Self-pay | Admitting: Internal Medicine

## 2017-02-14 ENCOUNTER — Ambulatory Visit (INDEPENDENT_AMBULATORY_CARE_PROVIDER_SITE_OTHER): Payer: BLUE CROSS/BLUE SHIELD | Admitting: Internal Medicine

## 2017-02-14 VITALS — BP 128/80 | HR 116 | Temp 98.3°F | Wt 143.0 lb

## 2017-02-14 DIAGNOSIS — S63615A Unspecified sprain of left ring finger, initial encounter: Secondary | ICD-10-CM

## 2017-02-14 NOTE — Patient Instructions (Signed)
Can keep your finger buddy tapped for about a week. I can use tylenol and ibuprofen for pain.

## 2017-02-14 NOTE — Telephone Encounter (Signed)
Addressed at visit today.
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
## 2017-02-15 ENCOUNTER — Encounter (HOSPITAL_COMMUNITY): Payer: Self-pay | Admitting: Emergency Medicine

## 2017-02-15 DIAGNOSIS — F3110 Bipolar disorder, current episode manic without psychotic features, unspecified: Secondary | ICD-10-CM | POA: Diagnosis not present

## 2017-02-15 DIAGNOSIS — X58XXXA Exposure to other specified factors, initial encounter: Secondary | ICD-10-CM | POA: Diagnosis not present

## 2017-02-15 DIAGNOSIS — S63615A Unspecified sprain of left ring finger, initial encounter: Secondary | ICD-10-CM | POA: Insufficient documentation

## 2017-02-15 DIAGNOSIS — R451 Restlessness and agitation: Secondary | ICD-10-CM | POA: Insufficient documentation

## 2017-02-15 DIAGNOSIS — Z87891 Personal history of nicotine dependence: Secondary | ICD-10-CM | POA: Insufficient documentation

## 2017-02-15 DIAGNOSIS — S60945A Unspecified superficial injury of left ring finger, initial encounter: Secondary | ICD-10-CM | POA: Diagnosis present

## 2017-02-15 DIAGNOSIS — F3010 Manic episode without psychotic symptoms, unspecified: Secondary | ICD-10-CM | POA: Diagnosis not present

## 2017-02-15 DIAGNOSIS — Y929 Unspecified place or not applicable: Secondary | ICD-10-CM | POA: Diagnosis not present

## 2017-02-15 DIAGNOSIS — Z79899 Other long term (current) drug therapy: Secondary | ICD-10-CM | POA: Insufficient documentation

## 2017-02-15 DIAGNOSIS — Y999 Unspecified external cause status: Secondary | ICD-10-CM | POA: Diagnosis not present

## 2017-02-15 DIAGNOSIS — Y939 Activity, unspecified: Secondary | ICD-10-CM | POA: Insufficient documentation

## 2017-02-15 DIAGNOSIS — F419 Anxiety disorder, unspecified: Secondary | ICD-10-CM | POA: Diagnosis not present

## 2017-02-15 DIAGNOSIS — R4587 Impulsiveness: Secondary | ICD-10-CM | POA: Diagnosis not present

## 2017-02-15 DIAGNOSIS — F332 Major depressive disorder, recurrent severe without psychotic features: Secondary | ICD-10-CM | POA: Diagnosis not present

## 2017-02-15 LAB — RAPID URINE DRUG SCREEN, HOSP PERFORMED
Amphetamines: NOT DETECTED
Barbiturates: NOT DETECTED
Benzodiazepines: NOT DETECTED
Cocaine: NOT DETECTED
Opiates: NOT DETECTED
Tetrahydrocannabinol: NOT DETECTED

## 2017-02-15 LAB — COMPREHENSIVE METABOLIC PANEL
ALT: 20 U/L (ref 14–54)
AST: 23 U/L (ref 15–41)
Albumin: 3.4 g/dL — ABNORMAL LOW (ref 3.5–5.0)
Alkaline Phosphatase: 40 U/L (ref 38–126)
Anion gap: 8 (ref 5–15)
BUN: 10 mg/dL (ref 6–20)
CO2: 22 mmol/L (ref 22–32)
Calcium: 8.5 mg/dL — ABNORMAL LOW (ref 8.9–10.3)
Chloride: 104 mmol/L (ref 101–111)
Creatinine, Ser: 0.82 mg/dL (ref 0.44–1.00)
GFR calc Af Amer: >60 mL/min (ref >60)
GFR calc non Af Amer: >60 mL/min (ref >60)
Glucose, Bld: 126 mg/dL — ABNORMAL HIGH (ref 65–99)
Potassium: 3.7 mmol/L (ref 3.5–5.1)
Sodium: 134 mmol/L — ABNORMAL LOW (ref 135–145)
Total Bilirubin: 0.5 mg/dL (ref 0.3–1.2)
Total Protein: 6.2 g/dL — ABNORMAL LOW (ref 6.5–8.1)

## 2017-02-15 LAB — CBC WITH DIFFERENTIAL/PLATELET
Basophils Absolute: 0 10*3/uL (ref 0.0–0.1)
Basophils Relative: 0 %
Eosinophils Absolute: 0 10*3/uL (ref 0.0–0.7)
Eosinophils Relative: 0 %
HCT: 34 % — ABNORMAL LOW (ref 36.0–46.0)
Hemoglobin: 11.4 g/dL — ABNORMAL LOW (ref 12.0–15.0)
Lymphocytes Relative: 23 %
Lymphs Abs: 1.9 10*3/uL (ref 0.7–4.0)
MCH: 29 pg (ref 26.0–34.0)
MCHC: 33.5 g/dL (ref 30.0–36.0)
MCV: 86.5 fL (ref 78.0–100.0)
Monocytes Absolute: 0.7 10*3/uL (ref 0.1–1.0)
Monocytes Relative: 8 %
Neutro Abs: 6 10*3/uL (ref 1.7–7.7)
Neutrophils Relative %: 69 %
Platelets: 315 10*3/uL (ref 150–400)
RBC: 3.93 MIL/uL (ref 3.87–5.11)
RDW: 13.4 % (ref 11.5–15.5)
WBC: 8.6 10*3/uL (ref 4.0–10.5)

## 2017-02-15 LAB — PREGNANCY, URINE: Preg Test, Ur: NEGATIVE

## 2017-02-15 LAB — ETHANOL: Alcohol, Ethyl (B): <5 mg/dL (ref <5)

## 2017-02-15 NOTE — ED Triage Notes (Signed)
Patient requesting psychiatric treatment for her bipolar disorder , she has not seen her psychiatrist for several months , denies suicidal ideations / no hallucinations .

## 2017-02-15 NOTE — Assessment & Plan Note (Signed)
Concern for fracture, given patient a normal range of motion and strength is swelling or bruising. Plan patient the option of x-ray and she agrees slightly no fracture opted not to have this.buddy taped her little and ring finger for comfort, take patient needs to take after a week or about 5 days. Can take ibuprofen and Tylenol for pain. Provided Patient with a note from work

## 2017-02-16 ENCOUNTER — Emergency Department (HOSPITAL_COMMUNITY)
Admission: EM | Admit: 2017-02-16 | Discharge: 2017-02-16 | Disposition: A | Discharge status: Home / Self Care | Payer: BLUE CROSS/BLUE SHIELD | Attending: Emergency Medicine | Admitting: Emergency Medicine

## 2017-02-16 ENCOUNTER — Ambulatory Visit: Payer: BLUE CROSS/BLUE SHIELD | Admitting: Family Medicine

## 2017-02-16 ENCOUNTER — Ambulatory Visit: Payer: BLUE CROSS/BLUE SHIELD | Admitting: Internal Medicine

## 2017-02-16 ENCOUNTER — Emergency Department (HOSPITAL_COMMUNITY): Payer: BLUE CROSS/BLUE SHIELD

## 2017-02-16 ENCOUNTER — Emergency Department (EMERGENCY_DEPARTMENT_HOSPITAL)
Admission: EM | Admit: 2017-02-16 | Discharge: 2017-02-18 | Disposition: A | Discharge status: Home / Self Care | Payer: BLUE CROSS/BLUE SHIELD | Source: Home / Self Care | Attending: Emergency Medicine | Admitting: Emergency Medicine

## 2017-02-16 ENCOUNTER — Encounter (HOSPITAL_COMMUNITY): Payer: Self-pay | Admitting: *Deleted

## 2017-02-16 ENCOUNTER — Encounter (HOSPITAL_COMMUNITY): Payer: Self-pay | Admitting: Registered Nurse

## 2017-02-16 DIAGNOSIS — F312 Bipolar disorder, current episode manic severe with psychotic features: Secondary | ICD-10-CM | POA: Diagnosis present

## 2017-02-16 DIAGNOSIS — F419 Anxiety disorder, unspecified: Secondary | ICD-10-CM | POA: Diagnosis not present

## 2017-02-16 DIAGNOSIS — Z79899 Other long term (current) drug therapy: Secondary | ICD-10-CM

## 2017-02-16 DIAGNOSIS — R450 Nervousness: Secondary | ICD-10-CM

## 2017-02-16 DIAGNOSIS — F3010 Manic episode without psychotic symptoms, unspecified: Secondary | ICD-10-CM | POA: Insufficient documentation

## 2017-02-16 DIAGNOSIS — F332 Major depressive disorder, recurrent severe without psychotic features: Secondary | ICD-10-CM | POA: Diagnosis present

## 2017-02-16 DIAGNOSIS — Z87891 Personal history of nicotine dependence: Secondary | ICD-10-CM | POA: Diagnosis not present

## 2017-02-16 DIAGNOSIS — G4700 Insomnia, unspecified: Secondary | ICD-10-CM | POA: Diagnosis not present

## 2017-02-16 DIAGNOSIS — F3163 Bipolar disorder, current episode mixed, severe, without psychotic features: Secondary | ICD-10-CM | POA: Diagnosis present

## 2017-02-16 DIAGNOSIS — R4587 Impulsiveness: Secondary | ICD-10-CM | POA: Diagnosis not present

## 2017-02-16 DIAGNOSIS — S63615A Unspecified sprain of left ring finger, initial encounter: Secondary | ICD-10-CM

## 2017-02-16 DIAGNOSIS — S6392XA Sprain of unspecified part of left wrist and hand, initial encounter: Secondary | ICD-10-CM

## 2017-02-16 MED ORDER — ZIPRASIDONE HCL 20 MG PO CAPS
80.0000 mg | ORAL_CAPSULE | Freq: Two times a day (BID) | ORAL | Status: DC
  Administered 2017-02-16: 80 mg via ORAL
  Filled 2017-02-16: qty 4
  Filled 2017-02-16: qty 1

## 2017-02-16 MED ORDER — BUPROPION HCL ER (XL) 150 MG PO TB24
150.0000 mg | ORAL_TABLET | Freq: Every day | ORAL | Status: DC
  Administered 2017-02-16: 150 mg via ORAL
  Filled 2017-02-16 (×2): qty 1

## 2017-02-16 MED ORDER — PANTOPRAZOLE SODIUM 40 MG PO TBEC
40.0000 mg | DELAYED_RELEASE_TABLET | Freq: Every day | ORAL | Status: DC
  Administered 2017-02-16: 40 mg via ORAL
  Filled 2017-02-16 (×2): qty 1

## 2017-02-16 MED ORDER — TRAZODONE HCL 50 MG PO TABS
50.0000 mg | ORAL_TABLET | Freq: Every day | ORAL | Status: DC
  Administered 2017-02-16: 50 mg via ORAL
  Filled 2017-02-16: qty 1

## 2017-02-16 MED ORDER — IBUPROFEN 400 MG PO TABS: 400.0000 mg | ORAL_TABLET | Freq: Once | ORAL | Status: DC

## 2017-02-16 MED ORDER — ACETAMINOPHEN 325 MG PO TABS
650.0000 mg | ORAL_TABLET | ORAL | Status: DC | PRN
  Filled 2017-02-16: qty 2

## 2017-02-16 MED ORDER — BENZTROPINE MESYLATE 1 MG PO TABS
0.5000 mg | ORAL_TABLET | Freq: Two times a day (BID) | ORAL | Status: DC
  Administered 2017-02-16: 0.5 mg via ORAL
  Filled 2017-02-16 (×2): qty 1

## 2017-02-16 NOTE — Progress Notes (Signed)
Pt. has been assessed and does not meet inpatient criteria, per CSW set [facility] per recommendation.  [facility] but is requesting to see a new psychiatrist.  CSW made appt for pt, with Dr. Lolly MustacheArfeen, for Tuesday, 03/28/17 @ 8 AM [facility] [address], [address].  [facility], Baird Lyonsasey.  Timmothy EulerJean T. Kaylyn LimSutter, MSW, LCSWA Disposition Clinical Social Work [phone] (cell) [phone] (office)
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
## 2017-02-16 NOTE — ED Notes (Addendum)
Pt wanded by security, belongings inventoried and placed in locker #6.  Pt has glasses at bedside.  Pt does have wrist brace on L wrist.

## 2017-02-16 NOTE — ED Provider Notes (Signed)
  Physical Exam  BP 115/65   Pulse 75   Temp 99.3 F (37.4 C) (Oral)   Resp 15   Wt 64 kg (141 lb)   LMP 01/18/2017 (Approximate)   SpO2 100%   BMI 24.20 kg/m   Physical Exam  ED Course  Procedures  MDM Patient has been seen by psych again and now no inpatient criteria. D/c       Kelly CorePickering, Kelly Menon, MD 02/16/17 [phone]

## 2017-02-16 NOTE — ED Notes (Signed)
Pt asking about Buspar. Informed it was not ordered by EDP but this RN will inquire. Pt pleased.

## 2017-02-16 NOTE — ED Notes (Signed)
TTS at the bedside. 

## 2017-02-16 NOTE — ED Notes (Signed)
Pt states that she would like her geodon as she did not have one since the am, as this med is due at 8am I explained to her that it is too late for me to give her the pm dose.  Pt reports that her left wrist hurts, pulled out tylenol for her and offered this.  Pt refuses tylenol.

## 2017-02-16 NOTE — ED Notes (Signed)
Snack offered and lunch order taken
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
## 2017-02-16 NOTE — ED Notes (Signed)
Pt spoke called children on phone. Verified visitation time with RN

## 2017-02-16 NOTE — ED Notes (Signed)
Pt verbalized understanding discharge instructions and denies any further needs or questions at this time. VS stable, ambulatory and steady gait.  Pt denies HI or SI Pt still upset for not being admitting.

## 2017-02-16 NOTE — ED Notes (Signed)
Pt taken to shower.

## 2017-02-16 NOTE — ED Triage Notes (Signed)
Pt stated "I was @ [facility] last night but they told me I didn't need to stay and she made me an appointment with a psychiatrist for 0800 in the morning."  Pt denies SI/HI.

## 2017-02-17 ENCOUNTER — Inpatient Hospital Stay (HOSPITAL_COMMUNITY): Admission: AD | Admit: 2017-02-17 | Payer: BLUE CROSS/BLUE SHIELD | Source: Intra-hospital | Admitting: Psychiatry

## 2017-02-17 ENCOUNTER — Other Ambulatory Visit: Payer: BLUE CROSS/BLUE SHIELD

## 2017-02-17 DIAGNOSIS — F332 Major depressive disorder, recurrent severe without psychotic features: Secondary | ICD-10-CM | POA: Diagnosis present

## 2017-02-17 LAB — RAPID URINE DRUG SCREEN, HOSP PERFORMED
Amphetamines: NOT DETECTED
Barbiturates: NOT DETECTED
Benzodiazepines: NOT DETECTED
Cocaine: NOT DETECTED
Opiates: POSITIVE — AB
Tetrahydrocannabinol: NOT DETECTED

## 2017-02-17 LAB — I-STAT CHEM 8, ED
BUN: 7 mg/dL (ref 6–20)
Calcium, Ion: 1.15 mmol/L (ref 1.15–1.40)
Chloride: 103 mmol/L (ref 101–111)
Creatinine, Ser: 0.7 mg/dL (ref 0.44–1.00)
Glucose, Bld: 117 mg/dL — ABNORMAL HIGH (ref 65–99)
HCT: 36 % (ref 36.0–46.0)
Hemoglobin: 12.2 g/dL (ref 12.0–15.0)
Potassium: 3.6 mmol/L (ref 3.5–5.1)
Sodium: 139 mmol/L (ref 135–145)
TCO2: 24 mmol/L (ref 22–32)

## 2017-02-17 LAB — ETHANOL: Alcohol, Ethyl (B): <5 mg/dL (ref <5)

## 2017-02-17 MED ORDER — TRAZODONE HCL 50 MG PO TABS
25.0000 mg | ORAL_TABLET | Freq: Every day | ORAL | Status: DC
  Administered 2017-02-17: 25 mg via ORAL
  Filled 2017-02-17: qty 1

## 2017-02-17 MED ORDER — BUSPIRONE HCL 5 MG PO TABS
7.5000 mg | ORAL_TABLET | Freq: Two times a day (BID) | ORAL | Status: DC
  Administered 2017-02-17 – 2017-02-18 (×3): 7.5 mg via ORAL
  Filled 2017-02-17 (×4): qty 1.5

## 2017-02-17 MED ORDER — BUPROPION HCL ER (XL) 150 MG PO TB24
150.0000 mg | ORAL_TABLET | Freq: Every day | ORAL | Status: DC
  Administered 2017-02-17 – 2017-02-18 (×2): 150 mg via ORAL
  Filled 2017-02-17 (×2): qty 1

## 2017-02-17 MED ORDER — ACETAMINOPHEN 325 MG PO TABS: 650.0000 mg | ORAL_TABLET | ORAL | Status: DC | PRN

## 2017-02-17 MED ORDER — BENZTROPINE MESYLATE 0.5 MG PO TABS
0.5000 mg | ORAL_TABLET | Freq: Every day | ORAL | Status: DC
  Administered 2017-02-17: 0.5 mg via ORAL
  Filled 2017-02-17: qty 1

## 2017-02-17 MED ORDER — MELATONIN 5 MG PO TABS: 1.0000 | ORAL_TABLET | Freq: Every evening | ORAL | Status: DC | PRN

## 2017-02-17 MED ORDER — BUSPIRONE HCL 15 MG PO TABS
7.5000 mg | ORAL_TABLET | Freq: Two times a day (BID) | ORAL | Status: DC
  Filled 2017-02-17 (×2): qty 1

## 2017-02-17 MED ORDER — PANTOPRAZOLE SODIUM 40 MG PO TBEC
40.0000 mg | DELAYED_RELEASE_TABLET | Freq: Every day | ORAL | Status: DC
  Administered 2017-02-17 – 2017-02-18 (×2): 40 mg via ORAL
  Filled 2017-02-17 (×2): qty 1

## 2017-02-17 MED ORDER — ZIPRASIDONE HCL 20 MG PO CAPS
80.0000 mg | ORAL_CAPSULE | Freq: Two times a day (BID) | ORAL | Status: DC
  Administered 2017-02-17 – 2017-02-18 (×3): 80 mg via ORAL
  Filled 2017-02-17 (×3): qty 4

## 2017-02-17 MED ORDER — ADULT MULTIVITAMIN W/MINERALS CH
1.0000 | ORAL_TABLET | Freq: Every day | ORAL | Status: DC
  Administered 2017-02-17 – 2017-02-18 (×2): 1 via ORAL
  Filled 2017-02-17 (×2): qty 1

## 2017-02-17 NOTE — ED Notes (Signed)
Pt's son Armanda Magic [phone]) visited and called stating pt is not stable and knows what to say to get discharged. Son reports pt has been in a manic episode and is afraid she will hurt self or others if discharged. Son want to be contacted before pt is discharged.

## 2017-02-17 NOTE — Progress Notes (Signed)
Per Berneice Heinrich , [facility], patient has been accepted to [facility], bed 407-1 ; Accepting provider is Dr. Jannifer Franklin; Attending provider is Dr. Jama Flavors.   Number for report is [phone].   Per Berneice Heinrich, [facility], patient's RN notified.    Baldo Daub MSW, LCSWA CSW Disposition [phone]

## 2017-02-17 NOTE — ED Notes (Signed)
Patient denies SI,HI and AVH at this time. Plan of care discussed with patient. Encouragement and support provided and safety maintain. Q 15 min safety checks remain in place and video monitoring.
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
## 2017-02-17 NOTE — Progress Notes (Signed)
Pt A & O X w3. Denies SI, HI, AVH and pain when assessed. Pt's mood is labile with periods of tearfulness, verbally abusive and irritability, "when the fuck am I going to leave here" . Observed with intermittent confusion, disorganized speech throughout this shift. Initially in agreement to transfer to [facility] for continuation of treatment, later declined transfer, demanding d/c. Emotional support and availability provided to pt. Routine safety checks maintained without self harm gestures thus far.

## 2017-02-17 NOTE — Progress Notes (Signed)
02/17/17 1349:  LRT introduced self to pt and offered activities.  LRT informed pt of activities available.  Pt couldn't decide what activity to do.  LRT gave pt some time to think about what activity she wanted to do.  LRT returned approximately 20 minutes later and pt stated she wanted to play UNO.  LRT and pt played two games of UNO.  Pt stated she plays this game with her daughter.  Pt was flat but appropriate.   Caroll Rancher, LRT/CTRS
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
## 2017-02-17 NOTE — ED Notes (Signed)
TTS assessment in progress via tele psych. 

## 2017-02-18 DIAGNOSIS — F332 Major depressive disorder, recurrent severe without psychotic features: Secondary | ICD-10-CM | POA: Diagnosis not present

## 2017-02-18 DIAGNOSIS — Z87891 Personal history of nicotine dependence: Secondary | ICD-10-CM

## 2017-02-18 NOTE — ED Notes (Signed)
Pt belongings moved to locker 38

## 2017-02-18 NOTE — [facility] Suicide Risk Assessment (Signed)
Suicide Risk Assessment  Discharge Assessment   [facility] Discharge Suicide Risk Assessment   Principal Problem: Major depressive disorder, recurrent severe without psychotic features [facility]) Discharge Diagnoses:  Patient Active Problem List   Diagnosis Date Noted  . Major depressive disorder, recurrent severe without psychotic features (HCC) [F33.2] 02/17/2017  . Manic behavior (HCC) [F30.10]   . Sprain of left ring finger [S63.615A] 02/15/2017  . Chest pain [R07.9] 11/23/2016  . Ingrown toenail without infection [L60.0] 09/03/2016  . Right shoulder pain [M25.511] 03/08/2016  . Piriformis syndrome of right side [G57.01] 02/23/2016  . Right shoulder injury [S49.91XA] 02/17/2016  . HLD (hyperlipidemia) [E78.5] 11/12/2015  . Dysuria [R30.0] 05/26/2015  . Normocytic anemia [D64.9] 05/26/2015  . Pain of upper abdomen [R10.10] 05/04/2015  . Low grade squamous intraepithelial lesion (LGSIL) on cervical Pap smear [R87.612] 03/04/2015  . Right hip pain [M25.551] 12/27/2014  . Pharyngitis, chronic [J31.2] 08/11/2014  . Irregular menses [N92.6] 07/17/2013  . GERD (gastroesophageal reflux disease) [K21.9] 11/10/2010  . Carpal tunnel syndrome of left wrist [G56.02] 11/10/2010  . Bipolar disorder (HCC) [F31.9] 11/25/2008  . OTH ABNORMAL PAPANICOLAOU SMEAR CERVIX&CERV HPV [R87.89] 11/25/2008  . DEPRESSION [F32.9] 10/21/2008    Total Time spent with patient: 45 minutes  Musculoskeletal: Strength & Muscle Tone: within normal limits Gait & Station: normal Patient leans: N/A  Psychiatric Specialty Exam: Physical Exam  Constitutional: She is oriented to person, place, and time. She appears well-developed and well-nourished.  Respiratory: Effort normal.  Musculoskeletal: Normal range of motion.  Neurological: She is alert and oriented to person, place, and time.  Psychiatric: Her speech is normal and behavior is normal. Thought content normal. Her mood appears anxious. Cognition and memory are  normal. She expresses impulsivity.   Review of Systems  Psychiatric/Behavioral: Positive for depression and substance abuse. Negative for hallucinations, memory loss and suicidal ideas. The patient is nervous/anxious and has insomnia.   All other systems reviewed and are negative.  Blood pressure 120/83, pulse (!) 103, temperature 98.6 F (37 C), temperature source Oral, resp. rate 17, height  (1.626 m), weight 64 kg (141 lb), SpO2 100 %.Body mass index is 24.2 kg/m. General Appearance: Casual Eye Contact:  Good Speech:  Clear and Coherent and Normal Rate Volume:  Normal Mood:  Anxious, Depressed and Irritable Affect:  Congruent and Depressed Thought Process:  Coherent, Goal Directed and Linear Orientation:  Full (Time, Place, and Person) Thought Content:  Logical Suicidal Thoughts:  No Homicidal Thoughts:  No Memory:  Immediate;   Good Recent;   Good Remote;   Fair Judgement:  Good Insight:  Fair Psychomotor Activity:  Normal Concentration:  Concentration: Good and Attention Span: Good Recall:  Good Fund of Knowledge:  Good Language:  Good Akathisia:  No Handed:  Right AIMS (if indicated):    Assets:  Communication Skills Desire for Improvement Financial Resources/Insurance Housing Resilience Social Support ADL's:  Intact Cognition:  WNL  Mental Status Per Nursing Assessment::   On Admission:   Stressed out and aggressive  Demographic Factors:  Caucasian  Loss Factors: Loss of significant relationship and Financial problems/change in socioeconomic status  Historical Factors: Impulsivity  Risk Reduction Factors:   Responsible for children under 92 years of age, Sense of responsibility to family, Employed and Living with another person, especially a relative  Continued Clinical Symptoms:  Depression:   Impulsivity  Cognitive Features That Contribute To Risk:  Closed-mindedness    Suicide Risk:  Minimal: No identifiable suicidal ideation.  Patients  presenting with  no risk factors but with morbid ruminations; may be classified as minimal risk based on the severity of the depressive symptoms    Plan Of Care/Follow-up recommendations:  Activity:  as tolerated Diet:  Heart Healthy  Laveda Abbe, NP 02/18/2017, 12:37 PM

## 2017-02-18 NOTE — Progress Notes (Signed)
CSW provided outpatient resources for therapy and medication management. Patient aware that she will need to follow up with agency prior to arrival to verify contract with insurance. Patient expressed understanding and was appreciative of the services provided by CSW. No other needs or concerns to report at this time. CSW to sign off.   Fernande Boyden, LCSWA Clinical Social Worker [facility] Ph: [phone]
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
## 2017-03-06 ENCOUNTER — Encounter: Payer: Self-pay | Admitting: Family Medicine

## 2017-03-06 ENCOUNTER — Ambulatory Visit (INDEPENDENT_AMBULATORY_CARE_PROVIDER_SITE_OTHER): Payer: BLUE CROSS/BLUE SHIELD | Admitting: Family Medicine

## 2017-03-06 VITALS — BP 102/62 | HR 97 | Temp 98.0°F | Ht 64.0 in | Wt 140.0 lb

## 2017-03-06 DIAGNOSIS — R05 Cough: Secondary | ICD-10-CM | POA: Diagnosis not present

## 2017-03-06 DIAGNOSIS — R058 Other specified cough: Secondary | ICD-10-CM | POA: Insufficient documentation

## 2017-03-06 MED ORDER — BENZONATATE 200 MG PO CAPS: 200.0000 mg | ORAL_CAPSULE | Freq: Two times a day (BID) | ORAL | 0 refills | Status: DC | PRN

## 2017-03-06 MED ORDER — HYDROXYZINE HCL 10 MG PO TABS: 10.0000 mg | ORAL_TABLET | Freq: Three times a day (TID) | ORAL | 0 refills | Status: DC | PRN

## 2017-03-06 NOTE — Patient Instructions (Signed)
Thank you for coming in to see [place] today. Please see below to review our plan for today's visit.  Your symptoms are likely due to exposure to cleaning agents and dust or other allergic triggers. You do not need antibiotics at this time. I have given you a prescription for  Tessalon Perles to help with the cough. In addition, you can take the hydroxyzine to help decrease histamine activity. Be sure to stay hydrated with plenty of water as you have loss many secretions. I would discontinue the Mucinex at this time. Make sure to wear a mask if you continue to clean. Return to the clinic in one week if her symptoms do not improve.  Please call the clinic at [phone] if your symptoms worsen or you have any concerns. It was my pleasure to see you. -- Durward Parcel, DO [facility], PGY-2

## 2017-03-06 NOTE — Assessment & Plan Note (Addendum)
Acute. Endorsing productive cough though none present on exam. No signs of bacterial infection. Likely due to bronchitis or other respiratory irritation from deep cleaning. Former smoker without h/o COPD or other immunocompromised state. --Tessalon perls for cough and hydroxyzine for antihistamine --Advised pt to wear mask when cleaning and maintain hydration --RTC in 1 week if not imporved

## 2017-03-06 NOTE — Progress Notes (Signed)
   Subjective:   Patient ID: Kelly Carr    DOB: [birth year], 46 y.o. female   MRN: [id number]  CC: "Cough"  HPI: Kelly Carr is a 46 y.o. female who presents for a same day appointment concerning cough. Problems discussed today are as follows:  URI  Has been sick for 7 days. Has been deep-cleaning house, bleach, vacuum without mask. Has been having some green sputum with cough. Nasal discharge: Clear, improved Medications tried: Mucinex and Zyrtec for 5 days, minimal relief Sick contacts: No  Symptoms Fever: No Headache or face pain: Some headache Tooth pain: No Sneezing: No Scratchy throat: Yes Allergies: No Muscle aches: Yes Severe fatigue: Yes Stiff neck: No Shortness of breath: No Rash: No Sore throat or swollen glands: No  Complete ROS performed, see HPI for pertinent.  PMFSH: GERD. Surgical history none. Family history DM, HTN, heart disease. Smoking status reviewed. Medications reviewed.  Objective:   BP 102/62   Pulse 97   Temp 98 F (36.7 C) (Oral)   Ht  (1.626 m)   Wt 140 lb (63.5 kg)   LMP 02/21/2017   SpO2 98%   BMI 24.03 kg/m  Vitals and nursing note reviewed.  General: well nourished, well developed, in no acute distress with non-toxic appearance HEENT: normocephalic, atraumatic, moist mucous membranes, pink conjunctiva, nonedematous tonsils Neck: supple, non-tender without lymphadenopathy CV: regular rate and rhythm without murmurs, rubs, or gallops, no lower extremity edema, non-productive cough present Lungs: clear to auscultation bilaterally with normal work of breathing Abdomen: soft, non-tender, non-distended, normoactive bowel sounds Skin: warm, dry, no rashes or lesions, cap refill < 2 seconds Extremities: warm and well perfused, normal tone  Assessment & Plan:   Productive cough Acute. Endorsing productive cough though none present on exam. No signs of bacterial infection. Likely due to bronchitis or other respiratory  irritation from deep cleaning. Former smoker without h/o COPD or other immunocompromised state. --Tessalon perls for cough and hydroxyzine for antihistamine --Advised pt to wear mask when cleaning and maintain hydration --RTC in 1 week if not imporved  No orders of the defined types were placed in this encounter.  Meds ordered this encounter  Medications  . benzonatate (TESSALON) 200 MG capsule    Sig: Take 1 capsule (200 mg total) by mouth 2 (two) times daily as needed for cough.    Dispense:  20 capsule    Refill:  0  . hydrOXYzine (ATARAX/VISTARIL) 10 MG tablet    Sig: Take 1 tablet (10 mg total) by mouth 3 (three) times daily as needed.    Dispense:  30 tablet    Refill:  0    Durward Parcel, DO [facility], PGY-2 03/06/2017 3:02 PM

## 2017-03-28 ENCOUNTER — Ambulatory Visit (HOSPITAL_COMMUNITY): Payer: BLUE CROSS/BLUE SHIELD | Admitting: Psychiatry

## 2017-04-05 ENCOUNTER — Ambulatory Visit (INDEPENDENT_AMBULATORY_CARE_PROVIDER_SITE_OTHER): Payer: BLUE CROSS/BLUE SHIELD | Admitting: Family Medicine

## 2017-04-05 ENCOUNTER — Encounter: Payer: Self-pay | Admitting: Family Medicine

## 2017-04-05 VITALS — BP 118/68 | HR 91 | Temp 98.3°F | Ht 64.0 in | Wt 128.0 lb

## 2017-04-05 DIAGNOSIS — M6798 Unspecified disorder of synovium and tendon, other site: Secondary | ICD-10-CM | POA: Diagnosis not present

## 2017-04-05 DIAGNOSIS — S73191D Other sprain of right hip, subsequent encounter: Secondary | ICD-10-CM

## 2017-04-05 DIAGNOSIS — M67951 Unspecified disorder of synovium and tendon, right thigh: Secondary | ICD-10-CM

## 2017-04-05 MED ORDER — CYCLOBENZAPRINE HCL 5 MG PO TABS: 5.0000 mg | ORAL_TABLET | Freq: Three times a day (TID) | ORAL | 0 refills | Status: DC | PRN

## 2017-04-05 MED ORDER — MELOXICAM 15 MG PO TABS: 15.0000 mg | ORAL_TABLET | Freq: Every day | ORAL | 0 refills | Status: DC

## 2017-04-05 NOTE — Assessment & Plan Note (Signed)
Likely degenerative changes of her hip seen on MRI. She had a fall which likely is exacerbated some of her underlying problems or any. - Meloxicam and Flexeril - If no improvement consider another interarticular injection

## 2017-04-05 NOTE — Patient Instructions (Addendum)
Thank you for coming in,   Please try the meloxicam for 10 days straight and then as needed.   The flexeril can make you drowsy so be careful before driving if you use it. You can take the flexeril once a day and build up to three times a a day.   Try the exercises once your pain has improved.   Please follow up with me if your pain doesn't seem to improve.     Please feel free to call with any questions or concerns at any time, at [phone]. --Dr. Jordan LikesSchmitz  Take tylenol 650 mg three times a day is the best evidence based medicine we have for arthritis.   Glucosamine sulfate 750mg  twice a day is a supplement that has been shown to help moderate to severe arthritis.  Vitamin D 2000 IU daily  Fish oil 2 grams daily.   Tumeric 500mg  twice daily.   Capsaicin topically up to four times a day may also help with pain. Cortisone injections are an option if these interventions do not seem to make a difference or need more relief.   If cortisone injections do not help, there are different types of shots that may help but they take longer to take effect.  We can discuss this at follow up.  It's important that you continue to stay active.  Shoe inserts with good arch support may be helpful.  [employer] could help.   Water aerobics and cycling with low resistance are the best two types of exercise for arthritis.  .Marland Kitchen
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
## 2017-04-05 NOTE — Progress Notes (Signed)
Kelly Carr - 46 y.o. female MRN [id number]  Date of birth: 05-18-1971  SUBJECTIVE:  Including CC & ROS.  Chief Complaint  Patient presents with  . Right hip pain    Pain has been ongoing since 2017-she fell in September, landed on her hip. She did not get it evaluated. Describes the pain as throbbing. She takes Aleve for the pain with no improvement.  She quit physical therapy at the end of 2017.    Ms. Furniss is a 46 y.o. female that is presenting with acute on chronic right hip pain. This is a recurrent issue for her. She was in [place] and fell on her buttock. She denies any ecchymosis or swelling when this occurred. Since that time she has had this ongoing achy pain. The pain is localized into her lateral hip and buttock. She denies any radicular symptoms. The pain is worse with movement. The pain is intermittent in nature. Denies any numbness or tingling. She received an intra-articular hip injection in November 2017 reports she had improvement of her pain after this.  Has been through physical therapy last year which seemed to improve her symptoms  Independent review of her right hip MRI in November 2017 shows superior anterior right labral tear, and tenderness is the right gluteus minimus at the insertion.  Independent review the right hip and AP of the pelvis from 2016 shows no significant joint disease of the right hip.   Review of Systems  Musculoskeletal: Negative for back pain, gait problem, joint swelling and myalgias.  Skin: Negative for color change.  Neurological: Negative for weakness and numbness.    HISTORY: Past Medical, Surgical, Social, and Family History Reviewed & Updated per EMR.   Pertinent Historical Findings include:  Past Medical History:  Diagnosis Date  . Abnormal Pap smear    Unknown results>colpo>normal  . Anxiety   . Bipolar 1 disorder (HCC)     No past surgical history on file.  Allergies  Allergen Reactions  . Haldol [Haloperidol  Lactate] Other (See Comments)    Syncope   . Risperidone And Related Other (See Comments)    Zones out  . Tramadol Itching    Family History  Problem Relation Age of Onset  . Diabetes Mother   . Hypertension Mother   . Heart disease Mother 85  . Diabetes Maternal Grandmother   . Heart disease Maternal Grandmother   . Diabetes Maternal Grandfather   . Heart disease Maternal Grandfather      Social History   Social History  . Marital status: Single    Spouse name: N/A  . Number of children: N/A  . Years of education: N/A   Occupational History  . Not on file.   Social History Main Topics  . Smoking status: Former Smoker    Types: Cigarettes    Quit date: 06/07/1999  . Smokeless tobacco: Never Used  . Alcohol use No  . Drug use: No  . Sexual activity: Yes    Partners: Male    Birth control/ protection: Pill   Other Topics Concern  . Not on file   Social History Narrative  . No narrative on file     PHYSICAL EXAM:  VS: BP 118/68 (BP Location: Left Arm, Patient Position: Sitting, Cuff Size: Normal)   Pulse 91   Temp 98.3 F (36.8 C) (Oral)   Ht 5\' 4"  (1.626 m)   Wt 128 lb (58.1 kg)   SpO2 99%   BMI 21.97 kg/m  Physical  Exam Gen: NAD, alert, cooperative with exam, well-appearing ENT: normal lips, normal nasal mucosa,  Eye: normal EOM, normal conjunctiva and lids CV:  no edema, +2 pedal pulses   Resp: no accessory muscle use, non-labored,   Skin: no rashes, no areas of induration  Neuro: normal tone, normal sensation to touch Psych:  normal insight, alert and oriented MSK:  Right hip: No significant tenderness to palpation of the right greater trochanter. Normal internal and external rotation of the hips. Normal strength resistance with hip flexion. Normal knee flexion and extension. Normal plantarflexion and dorsiflexion. Negative straight leg raise bilaterally. Normal gait. Weakness with hip abduction. Neurovascular intact     ASSESSMENT &  PLAN:   Labral tear of hip joint Likely degenerative changes of her hip seen on MRI. She had a fall which likely is exacerbated some of her underlying problems or any. - Meloxicam and Flexeril - If no improvement consider another interarticular injection  Tendinopathy of right gluteus medius Has weakness which likely leads to some of the pain on the lateral aspect of her right hip. She also had a trauma overlying this area but unlikely to be associated with tear based on the strength from today. - Counseled on home exercises - If no improvement could consider imaging

## 2017-04-14 ENCOUNTER — Ambulatory Visit (INDEPENDENT_AMBULATORY_CARE_PROVIDER_SITE_OTHER): Payer: BLUE CROSS/BLUE SHIELD | Admitting: Internal Medicine

## 2017-04-14 ENCOUNTER — Encounter: Payer: Self-pay | Admitting: Internal Medicine

## 2017-04-14 ENCOUNTER — Other Ambulatory Visit: Payer: Self-pay

## 2017-04-14 VITALS — BP 102/62 | HR 88 | Temp 98.4°F | Wt 135.0 lb

## 2017-04-14 DIAGNOSIS — K5900 Constipation, unspecified: Secondary | ICD-10-CM

## 2017-04-14 DIAGNOSIS — Z23 Encounter for immunization: Secondary | ICD-10-CM

## 2017-04-14 DIAGNOSIS — R079 Chest pain, unspecified: Secondary | ICD-10-CM | POA: Diagnosis not present

## 2017-04-14 DIAGNOSIS — F3170 Bipolar disorder, currently in remission, most recent episode unspecified: Secondary | ICD-10-CM | POA: Diagnosis not present

## 2017-04-14 DIAGNOSIS — K219 Gastro-esophageal reflux disease without esophagitis: Secondary | ICD-10-CM

## 2017-04-14 DIAGNOSIS — Z3009 Encounter for other general counseling and advice on contraception: Secondary | ICD-10-CM

## 2017-04-14 MED ORDER — FAMOTIDINE 20 MG PO TABS: 20.0000 mg | ORAL_TABLET | Freq: Two times a day (BID) | ORAL | 1 refills | Status: DC

## 2017-04-14 MED ORDER — DOCUSATE SODIUM 100 MG PO CAPS: 100.0000 mg | ORAL_CAPSULE | Freq: Two times a day (BID) | ORAL | 0 refills | Status: DC

## 2017-04-14 NOTE — Patient Instructions (Addendum)
It was nice meeting you today.  Let us increase your Pepcid to twice daily.  And see how that helps.  I want you to also increase your medication MiraLAX to help.  I have prescribed you Colace you can take this as well.  Let us see how well you improve with these new changes.  If not please see me back in about 6 weeks and we can decide whether we need to have a follow-up with gastroenterology.  I will place the order for your referral to the cardiologist.

## 2017-04-14 NOTE — Assessment & Plan Note (Signed)
Atypical chest pain -given lack of exertional quality, radiation to the right side of the chest, and the fact that it only last for a few seconds unlikely to be cardiac in nature, most likely musculoskeletal.  However patient does have family history of early cardiac disease in her mother at age 44.  Therefore will have patient follow-up with cardiology for possible stress test.

## 2017-04-14 NOTE — Assessment & Plan Note (Signed)
Previously failed omeprazole.  States this did not help at all.  Currently on Pepcid.  Discussed taking this twice daily.  Given patient has nausea and early satiety is possible that she has a hiatal hernia.  Which is contributing to her symptoms.  If her symptoms are not well controlled on Pepcid could consider having her follow-up with GI for a endoscopy. -also has a history of constipation I do not know if this is contributing.  Recommended patient continue MiraLAX and Colace consistently to make sure she has a bowel movement every day -Ultrasound was placed for possible evaluation of gallbladder stones per Dr. Shawnie PonsPratt -Patient to try Pepcid twice daily to see if that helps with symptoms. - iIf not patient to follow-up in about 6 weeks, to discuss referral to gastroenterology for further follow-up

## 2017-04-14 NOTE — Assessment & Plan Note (Signed)
>>  ASSESSMENT AND PLAN FOR SEVERE BIPOLAR I DISORDER, CURRENT OR MOST RECENT EPISODE MIXED (HCC) WRITTEN ON 04/14/2017  4:20 PM BY MIKELL, Antionette PolesASIYAH ZAHRA, MD  Patient is currently doing well she is on several new medications prescribed by her psychiatrist.  She has discontinued Geodon alone and is currently on Latuda following up with him in the next couple of weeks.

## 2017-04-14 NOTE — Assessment & Plan Note (Signed)
Patient is currently doing well she is on several new medications prescribed by her psychiatrist.  She has discontinued Geodon alone and is currently on Latuda following up with him in the next couple of weeks.

## 2017-04-14 NOTE — Progress Notes (Signed)
[facility] Noralee CharsAsiyah Dajour [place], MD Phone: [phone]  Reason For Visit: Follow-up visit  # Biopolar  -Patient with a history of bipolar disorder. she sees a psychiatrist - Dr, Shawnee KnappShugart, [facility].  He has recently made significant changes to her medications.  She is now no longer on Geodon and has been started on Latuda.  He also increased her BuSpar.  Overall she states that she is doing well. Feels like these are good changes.  # GERD  -Patient has had several years of reflux.  Or upper epigastric pain.  She states that she has been placed on omeprazole in the past without much significant changes. -She indicates at times feeling nauseated and having early satiety after eating.  She indicates having burning sensation in her epigastric area.  She denies any esophageal burning. Denies she denies any association with greasy foods. Denies any radiation to the left side, back or shoulder.  She is currently on Pepcid however she is only taking this once daily.  She was scheduled for a right upper quadrant ultrasound to rule out Gallbladder stones however she did not obtain this. -Patient also states that she has a history of constipation.  Has bowel movement every 3-5 days  Chest Pain  -Patient has been having intermittent chest pain.  Not recently.  Was seen by Dr. Shawnie PonsPratt several months ago for this issue.  And was supposed to be scheduled for stress test.  However she never obtained this.  Patient states that this chest pain-she describes it as a tightness and shooting pain.  She says it lasts for a couple of seconds.  She denies any nausea vomiting diaphoresis or radiation of this pain into her left chest.  She states that the pain sometimes radiates to her right chest.  This is not an exertional chest pain.  It does not have a specific association.  She denies it being associated with food. -Patient does have a family history of early cardiac disease in her mother  starting around 44 years old -Not a current smoker. No history of elevated blood pressure.  Past Medical History Reviewed problem list.  Medications- reviewed and updated No additions to family history Social history- patient is a previous smoker   Objective: BP 102/62   Pulse 88   Temp 98.4 F (36.9 C) (Oral)   Wt 135 lb (61.2 kg)   LMP 04/05/2017   SpO2 99%   BMI 23.17 kg/m  Gen: NAD, alert, cooperative with exam Cardio: regular rate and rhythm, S1S2 heard, no murmurs appreciated Pulm: clear to auscultation bilaterally, no wheezes, rhonchi or ral es GI: soft, non-tender, non-distended, bowel sounds present, no hepatomegaly, no splenomegaly Skin: dry, intact, no rashes or lesions  Assessment/Plan: See problem based a/p  GERD (gastroesophageal reflux disease) Previously failed omeprazole.  States this did not help at all.  Currently on Pepcid.  Discussed taking this twice daily.  Given patient has nausea and early satiety is possible that she has a hiatal hernia.  Which is contributing to her symptoms.  If her symptoms are not well controlled on Pepcid could consider having her follow-up with GI for a endoscopy. -also has a history of constipation I do not know if this is contributing.  Recommended patient continue MiraLAX and Colace consistently to make sure she has a bowel movement every day -Ultrasound was placed for possible evaluation of gallbladder stones per Dr. Shawnie PonsPratt -Patient to try Pepcid twice daily to see if that helps with  symptoms. - iIf not patient to follow-up in about 6 weeks, to discuss referral to gastroenterology for further follow-up  Chest pain Atypical chest pain -given lack of exertional quality, radiation to the right side of the chest, and the fact that it only last for a few seconds unlikely to be cardiac in nature, most likely musculoskeletal.  However patient does have family history of early cardiac disease in her mother at age 44.  Therefore will  have patient follow-up with cardiology for possible stress test.   Bipolar disorder [facility](HCC) Patient is currently doing well she is on several new medications prescribed by her psychiatrist.  She has discontinued Geodon alone and is currently on Latuda following up with him in the next couple of weeks.

## 2017-05-01 ENCOUNTER — Other Ambulatory Visit: Payer: Self-pay | Admitting: Obstetrics and Gynecology

## 2017-05-02 ENCOUNTER — Encounter: Payer: Self-pay | Admitting: Obstetrics & Gynecology

## 2017-05-02 ENCOUNTER — Ambulatory Visit (INDEPENDENT_AMBULATORY_CARE_PROVIDER_SITE_OTHER): Payer: BLUE CROSS/BLUE SHIELD | Admitting: Obstetrics & Gynecology

## 2017-05-02 VITALS — BP 134/85 | HR 78 | Wt 136.2 lb

## 2017-05-02 DIAGNOSIS — Z01419 Encounter for gynecological examination (general) (routine) without abnormal findings: Secondary | ICD-10-CM | POA: Diagnosis not present

## 2017-05-02 DIAGNOSIS — Z1151 Encounter for screening for human papillomavirus (HPV): Secondary | ICD-10-CM

## 2017-05-02 DIAGNOSIS — N852 Hypertrophy of uterus: Secondary | ICD-10-CM

## 2017-05-02 DIAGNOSIS — Z124 Encounter for screening for malignant neoplasm of cervix: Secondary | ICD-10-CM

## 2017-05-02 NOTE — Addendum Note (Signed)
Addended by: Cheree DittoGRAHAM, DEMETRICE A on: 05/02/2017 03:27 PM   Modules accepted: Orders

## 2017-05-02 NOTE — Progress Notes (Signed)
Last pap 04/2016 - Normal

## 2017-05-02 NOTE — Progress Notes (Signed)
Subjective:     Kelly Carr is a 46 y.o. female here for a routine exam.  Current complaints: pt reports havey menses in the first 2-3 days. She denies other GYN problems. She report sa h/o prev abnormal PAP with a +hrHPV.      Gynecologic History Patient's last menstrual period was 04/05/2017. Contraception: OCP (estrogen/progesterone) Last Pap: 04/26/2016. Results were: normal Last mammogram: 03/2015. Results were: normal  Obstetric History OB History  Gravida Para Term Preterm AB Living  4 2 2   2 2   SAB TAB Ectopic Multiple Live Births  2       2    # Outcome Date GA Lbr Len/2nd Weight Sex Delivery Anes PTL Lv  4 Term 08/12/00    F Vag-Spont   LIV  3 Term 05/10/95    M Vag-Spont   LIV  2 SAB           1 SAB              The following portions of the patient's history were reviewed and updated as appropriate: allergies, current medications, past family history, past medical history, past social history, past surgical history and problem list.  Review of Systems Pertinent items are noted in HPI.    Objective:  BP 135/85   Pulse 78   Wt 136 lb 3.2 oz (61.8 kg)   LMP 04/05/2017   BMI 23.38 kg/m   General Appearance:    Alert, cooperative, no distress, appears stated age  Head:    Normocephalic, without obvious abnormality, atraumatic  Eyes:    conjunctiva/corneas clear, EOM's intact, both eyes  Ears:    Normal external ear canals, both ears  Nose:   Nares normal, septum midline, mucosa normal, no drainage    or sinus tenderness  Throat:   Lips, mucosa, and tongue normal; teeth and gums normal  Neck:   Supple, symmetrical, trachea midline, no adenopathy;    thyroid:  no enlargement/tenderness/nodules  Back:     Symmetric, no curvature, ROM normal, no CVA tenderness  Lungs:     Clear to auscultation bilaterally, respirations unlabored  Chest Wall:    No tenderness or deformity   Heart:    Regular rate and rhythm, S1 and S2 normal, no murmur, rub   or gallop  Breast  Exam:    No tenderness, masses, or nipple abnormality  Abdomen:     Soft, non-tender, bowel sounds active all four quadrants,    no masses, no organomegaly  Genitalia:    Normal female without lesion, discharge or tenderness; her uterus is 10-12 weeks sized and mobile.      Extremities:   Extremities normal, atraumatic, no cyanosis or edema  Pulses:   2+ and symmetric all extremities  Skin:   Skin color, texture, turgor normal, no rashes or lesions    Assessment:    Healthy female exam.   Enlarged uterus suspect uterine fibroids Heavy menses   Plan:    Follow up in: 1 year.    F/u PAP with hrHPV Screening mammogram Pelvic US Keep OCPs. No refills Pt plans to sign up for [employer]  Tabita Corbo L. Harraway-Smith, M.D., Evern CoreFACOG

## 2017-05-02 NOTE — Addendum Note (Signed)
Addended by: Cheree DittoGRAHAM, Sarahgrace Broman A on: 05/02/2017 03:29 PM   Modules accepted: Orders

## 2017-05-02 NOTE — Patient Instructions (Signed)
Uterine Fibroids Uterine fibroids are tissue masses (tumors) that can develop in the womb (uterus). They are also called leiomyomas. This type of tumor is not cancerous (benign) and does not spread to other parts of the body outside of the pelvic area, which is between the hip bones. Occasionally, fibroids may develop in the fallopian tubes, in the cervix, or on the support structures (ligaments) that surround the uterus. You can have one or many fibroids. Fibroids can vary in size, weight, and where they grow in the uterus. Some can become quite large. Most fibroids do not require medical treatment. What are the causes? A fibroid can develop when a single uterine cell keeps growing (replicating). Most cells in the human body have a control mechanism that keeps them from replicating without control. What are the signs or symptoms? Symptoms may include:  Heavy bleeding during your period.  Bleeding or spotting between periods.  Pelvic pain and pressure.  Bladder problems, such as needing to urinate more often (urinary frequency) or urgently.  Inability to reproduce offspring (infertility).  Miscarriages.  How is this diagnosed? Uterine fibroids are diagnosed through a physical exam. Your health care provider may feel the lumpy tumors during a pelvic exam. Ultrasonography and an MRI may be done to determine the size, location, and number of fibroids. How is this treated? Treatment may include:  Watchful waiting. This involves getting the fibroid checked by your health care provider to see if it grows or shrinks. Follow your health care provider's recommendations for how often to have this checked.  Hormone medicines. These can be taken by mouth or given through an intrauterine device (IUD).  Surgery. ? Removing the fibroids (myomectomy) or the uterus (hysterectomy). ? Removing blood supply to the fibroids (uterine artery embolization).  If fibroids interfere with your fertility and you  want to become pregnant, your health care provider may recommend having the fibroids removed. Follow these instructions at home:  Keep all follow-up visits as directed by your health care provider. This is important.  Take over-the-counter and prescription medicines only as told by your health care provider. ? If you were prescribed a hormone treatment, take the hormone medicines exactly as directed.  Ask your health care provider about taking iron pills and increasing the amount of dark green, leafy vegetables in your diet. These actions can help to boost your blood iron levels, which may be affected by heavy menstrual bleeding.  Pay close attention to your period and tell your health care provider about any changes, such as: ? Increased blood flow that requires you to use more pads or tampons than usual per month. ? A change in the number of days that your period lasts per month. ? A change in symptoms that are associated with your period, such as abdominal cramping or back pain. Contact a health care provider if:  You have pelvic pain, back pain, or abdominal cramps that cannot be controlled with medicines.  You have an increase in bleeding between and during periods.  You soak tampons or pads in a half hour or less.  You feel lightheaded, extra tired, or weak. Get help right away if:  You faint.  You have a sudden increase in pelvic pain. This information is not intended to replace advice given to you by your health care provider. Make sure you discuss any questions you have with your health care provider. Document Released: 05/20/2000 Document Revised: 01/21/2016 Document Reviewed: 11/19/2013 Elsevier Interactive Patient Education  2018 Elsevier Inc.  

## 2017-05-03 ENCOUNTER — Ambulatory Visit
Admission: RE | Admit: 2017-05-03 | Discharge: 2017-05-03 | Disposition: A | Discharge status: Home / Self Care | Payer: BLUE CROSS/BLUE SHIELD | Source: Ambulatory Visit | Attending: Obstetrics & Gynecology | Admitting: Obstetrics & Gynecology

## 2017-05-03 DIAGNOSIS — Z01419 Encounter for gynecological examination (general) (routine) without abnormal findings: Secondary | ICD-10-CM

## 2017-05-04 ENCOUNTER — Ambulatory Visit
Admission: RE | Admit: 2017-05-04 | Discharge: 2017-05-04 | Disposition: A | Discharge status: Home / Self Care | Payer: BLUE CROSS/BLUE SHIELD | Source: Ambulatory Visit | Attending: Obstetrics & Gynecology | Admitting: Obstetrics & Gynecology

## 2017-05-04 DIAGNOSIS — N852 Hypertrophy of uterus: Secondary | ICD-10-CM

## 2017-05-08 LAB — CYTOLOGY - PAP
Diagnosis: NEGATIVE
HPV 16/18/45 genotyping: NEGATIVE
HPV: DETECTED — AB

## 2017-05-10 ENCOUNTER — Telehealth: Payer: Self-pay

## 2017-05-10 NOTE — Telephone Encounter (Signed)
TC to pt to make aware of abnormal pap results +HPV and to repeat pap x 1 yr.  Pt not available left detailed message for pt to contact the office.  [facility] CMA

## 2017-05-26 ENCOUNTER — Encounter: Payer: Self-pay | Admitting: Family Medicine

## 2017-05-26 ENCOUNTER — Ambulatory Visit: Payer: BLUE CROSS/BLUE SHIELD | Admitting: Family Medicine

## 2017-05-26 VITALS — BP 128/64 | HR 76 | Temp 98.2°F | Ht 64.0 in | Wt 136.0 lb

## 2017-05-26 DIAGNOSIS — M6798 Unspecified disorder of synovium and tendon, other site: Secondary | ICD-10-CM

## 2017-05-26 DIAGNOSIS — S73191D Other sprain of right hip, subsequent encounter: Secondary | ICD-10-CM

## 2017-05-26 DIAGNOSIS — M67951 Unspecified disorder of synovium and tendon, right thigh: Secondary | ICD-10-CM

## 2017-05-26 MED ORDER — CYCLOBENZAPRINE HCL 5 MG PO TABS: 5.0000 mg | ORAL_TABLET | Freq: Three times a day (TID) | ORAL | 1 refills | Status: DC | PRN

## 2017-05-26 MED ORDER — DICLOFENAC SODIUM 2 % TD SOLN: 1.0000 "application " | Freq: Two times a day (BID) | TRANSDERMAL | 3 refills | Status: DC

## 2017-05-26 NOTE — Patient Instructions (Signed)
Take tylenol 650 mg three times a day is the best evidence based medicine we have for arthritis.   Glucosamine sulfate 750mg  twice a day is a supplement that has been shown to help moderate to severe arthritis.  Vitamin D 2000 IU daily  Fish oil 2 grams daily.   Tumeric 500mg  twice daily.   Capsaicin topically up to four times a day may also help with pain.  Water aerobics and cycling with low resistance are good exercise

## 2017-05-26 NOTE — Progress Notes (Signed)
Kelly Carr - 46 y.o. female MRN [id number]  Date of birth: [birth year]  SUBJECTIVE:  Including CC & ROS.  Chief Complaint  Patient presents with  . Right hip pain    Kelly Carr is a 46 y.o. female that is here for right hip injection. Pain has not improved. She states she has been doing daily exercises for the pain with no improvement. She has had an intra-articular hip injection in 2017 and had improvement of her symptoms. This is acute on chronic in nature. The pain is in the lateral aspect of her right hip but also in the groin area. She has been through physical therapy which helped some. She also has been doing some home exercises. Walking a lot at work seems exacerbate the pain. The pain is moderate in nature. The pain is localized to the hip is no radiation distally. She has taken anti-inflammatories and Flexeril with some relief.  Ultrasound of her pelvis from November 2018 shows normal uterus and ovaries and no pelvic mass.  Independent review of her right hip MRI from 2017 she was a superior anterior right labral tear and tendinosis of the right gluteus minimus.  Independent review of the right hip xray and pelvis from 2016 shows no joint disease.    Review of Systems  Constitutional: Negative for fever.  Respiratory: Negative for cough.   Cardiovascular: Negative for chest pain.  Musculoskeletal: Positive for arthralgias. Negative for back pain and gait problem.  Skin: Negative for color change.  Neurological: Negative for weakness and numbness.  Hematological: Negative for adenopathy.  Psychiatric/Behavioral: Negative for agitation.    HISTORY: Past Medical, Surgical, Social, and Family History Reviewed & Updated per EMR.   Pertinent Historical Findings include:  Past Medical History:  Diagnosis Date  . Abnormal Pap smear    Unknown results>colpo>normal  . Anxiety   . Bipolar 1 disorder (HCC)     No past surgical history on file.  Allergies  Allergen  Reactions  . Haldol [Haloperidol Lactate] Other (See Comments)    Syncope   . Risperidone And Related Other (See Comments)    Zones out  . Tramadol Itching    Family History  Problem Relation Age of Onset  . Diabetes Mother   . Hypertension Mother   . Heart disease Mother [id number]  . Diabetes Maternal Grandmother   . Heart disease Maternal Grandmother   . Diabetes Maternal Grandfather   . Heart disease Maternal Grandfather   . Breast cancer Neg Hx      Social History   Socioeconomic History  . Marital status: Single    Spouse name: Not on file  . Number of children: Not on file  . Years of education: Not on file  . Highest education level: Not on file  Social Needs  . Financial resource strain: Not on file  . Food insecurity - worry: Not on file  . Food insecurity - inability: Not on file  . Transportation needs - medical: Not on file  . Transportation needs - non-medical: Not on file  Occupational History  . Not on file  Tobacco Use  . Smoking status: Former Smoker    Types: Cigarettes    Last attempt to quit: 06/07/1999    Years since quitting: 17.9  . Smokeless tobacco: Never Used  Substance and Sexual Activity  . Alcohol use: No  . Drug use: No  . Sexual activity: Yes    Partners: Male    Birth control/protection: Pill  Other Topics  Concern  . Not on file  Social History Narrative  . Not on file     PHYSICAL EXAM:  VS: BP 128/64 (BP Location: Left Arm, Patient Position: Sitting, Cuff Size: Normal)   Pulse 76   Temp 98.2 F (36.8 C) (Oral)   Ht 5\' 4"  (1.626 m)   Wt 136 lb (61.7 kg)   SpO2 98%   BMI 23.34 kg/m  Physical Exam Gen: NAD, alert, cooperative with exam, well-appearing ENT: normal lips, normal nasal mucosa,  Eye: normal EOM, normal conjunctiva and lids CV:  no edema, +2 pedal pulses   Resp: no accessory muscle use, non-labored,  Skin: no rashes, no areas of induration  Neuro: normal tone, normal sensation to touch Psych:  normal insight,  alert and oriented MSK:  Right HIP:  No TTP of the GT, piriformis, lumbar spine  Normal IR and ER of the hip Normal strength to resistance  Normal gait  Weakness with hip abduction  Negative SLR b/l  Neurovascularly intact.    Aspiration/Injection Procedure Note Keyonna Meiring [birth year]  Procedure: injection  Indications: right hip pain   Procedure Details Consent: Risks of procedure as well as the alternatives and risks of each were explained to the (patient/caregiver).  Consent for procedure obtained. Time Out: Verified patient identification, verified procedure, site/side was marked, verified correct patient position, special equipment/implants available, medications/allergies/relevent history reviewed, required imaging and test results available.  Performed.  The area was cleaned with iodine and alcohol swabs.    The right intra articular hip injected. 5 cc of 1% lidocaine without epi was used on a 3 in 22 gauge needle to anesthetize the skin and the tract. The joint was injected using 1 cc's of 40 mg Kenalog and 4 cc's of 0.25% bupivacaine.  Ultrasound was used. Images were obtained in Long views showing the injection.    A sterile dressing was applied.  Patient did tolerate procedure well.         ASSESSMENT & PLAN:   Labral tear of hip joint Acute on chronic pain.  - injection today  - pennsaid provided  - could consider referral to Dr. Caswell CorwinStubbs for labral repair

## 2017-05-26 NOTE — Assessment & Plan Note (Signed)
Acute on chronic pain.  - injection today  - pennsaid provided  - could consider referral to Dr. Caswell CorwinStubbs for labral repair

## 2017-07-07 ENCOUNTER — Encounter: Payer: Self-pay | Admitting: Internal Medicine

## 2017-07-22 ENCOUNTER — Other Ambulatory Visit: Payer: Self-pay

## 2017-07-22 ENCOUNTER — Ambulatory Visit (HOSPITAL_COMMUNITY)
Admission: EM | Admit: 2017-07-22 | Discharge: 2017-07-22 | Disposition: A | Discharge status: Home / Self Care | Payer: BLUE CROSS/BLUE SHIELD | Attending: Family Medicine | Admitting: Family Medicine

## 2017-07-22 ENCOUNTER — Encounter (HOSPITAL_COMMUNITY): Payer: Self-pay | Admitting: Physician Assistant

## 2017-07-22 DIAGNOSIS — J029 Acute pharyngitis, unspecified: Secondary | ICD-10-CM

## 2017-07-22 DIAGNOSIS — Z888 Allergy status to other drugs, medicaments and biological substances status: Secondary | ICD-10-CM | POA: Insufficient documentation

## 2017-07-22 DIAGNOSIS — R51 Headache: Secondary | ICD-10-CM | POA: Diagnosis present

## 2017-07-22 DIAGNOSIS — B349 Viral infection, unspecified: Secondary | ICD-10-CM | POA: Diagnosis not present

## 2017-07-22 DIAGNOSIS — Z87891 Personal history of nicotine dependence: Secondary | ICD-10-CM | POA: Diagnosis not present

## 2017-07-22 DIAGNOSIS — Z79899 Other long term (current) drug therapy: Secondary | ICD-10-CM | POA: Insufficient documentation

## 2017-07-22 DIAGNOSIS — R0981 Nasal congestion: Secondary | ICD-10-CM | POA: Diagnosis present

## 2017-07-22 LAB — POCT RAPID STREP A: Streptococcus, Group A Screen (Direct): NEGATIVE

## 2017-07-22 MED ORDER — MELOXICAM 7.5 MG PO TABS: 7.5000 mg | ORAL_TABLET | Freq: Every day | ORAL | 0 refills | Status: DC

## 2017-07-22 MED ORDER — FLUTICASONE PROPIONATE 50 MCG/ACT NA SUSP: 2.0000 | Freq: Every day | NASAL | 0 refills | Status: DC

## 2017-07-22 MED ORDER — BENZONATATE 100 MG PO CAPS: 100.0000 mg | ORAL_CAPSULE | Freq: Three times a day (TID) | ORAL | 0 refills | Status: DC

## 2017-07-22 MED ORDER — IPRATROPIUM BROMIDE 0.06 % NA SOLN: 2.0000 | Freq: Four times a day (QID) | NASAL | 0 refills | Status: DC

## 2017-07-22 NOTE — Discharge Instructions (Signed)
Rapid strep negative. Tessalon for cough. Mobic for body aches. Do not take ibuprofen (advil)/naproxen (aleve) while on Mobic. Start flonase, atrovent nasal spray for nasal congestion/drainage. You can use over the counter nasal saline rinse such as neti pot for nasal congestion. Keep hydrated, your urine should be clear to pale yellow in color. Tylenol for fever and pain. Monitor for any worsening of symptoms, chest pain, shortness of breath, wheezing, swelling of the throat, follow up for reevaluation.   For sore throat try using a honey-based tea. Use 3 teaspoons of honey with juice squeezed from half lemon. Place shaved pieces of ginger into 1/2-1 cup of water and warm over stove top. Then mix the ingredients and repeat every 4 hours as needed.

## 2017-07-22 NOTE — ED Provider Notes (Signed)
[facility]    CSN: [id number] Arrival date & time: 07/22/17  1501     History   Chief Complaint Chief Complaint  Patient presents with  . Headache  . Nasal Congestion    and drainage  . Sore Throat  . Generalized Body Aches  . Chills    HPI Kelly Carr is a 47 y.o. female.   47 year old female with history of bipolar 1 disorder, anxiety, HLD, GERD, comes in for 3 day history of URI symptoms.  Has had headache, nasal congestion, rhinorrhea, sore throat, body aches, chills, nonproductive cough. otc cold medicine without relief.  Has been eating and drinking without problems.  Denies sick contact. Former smoker, 10-15 years, 1 pack/week.       Past Medical History:  Diagnosis Date  . Abnormal Pap smear    Unknown results>colpo>normal  . Anxiety   . Bipolar 1 disorder [facility](HCC)     Patient Active Problem List   Diagnosis Date Noted  . Constipation 04/14/2017  . Tendinopathy of right gluteus medius 04/05/2017  . Productive cough 03/06/2017  . 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 Manic behavior (HCC)   . Sprain of left ring finger 02/15/2017  . Chest pain 11/23/2016  . Ingrown toenail without infection 09/03/2016  . Right shoulder pain 03/08/2016  . Piriformis syndrome of right side 02/23/2016  . Right shoulder injury 02/17/2016  . HLD (hyperlipidemia) 11/12/2015  . Dysuria 05/26/2015  . Normocytic anemia 05/26/2015  . Pain of upper abdomen 05/04/2015  . 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 Labral tear of hip joint 12/27/2014  . Pharyngitis, chronic 08/11/2014  . Irregular menses 07/17/2013  . GERD (gastroesophageal reflux disease) 11/10/2010  . Carpal tunnel syndrome of left wrist 11/10/2010  . Bipolar disorder (HCC) 11/25/2008  . OTH ABNORMAL PAPANICOLAOU SMEAR CERVIX&CERV HPV 11/25/2008  . DEPRESSION 10/21/2008    History reviewed. No  pertinent surgical history.  OB History    Gravida Para Term Preterm AB Living   4 2 2   2 2    SAB TAB Ectopic Multiple Live Births   2       2       Home Medications    Prior to Admission medications   Medication Sig Start Date End Date Taking? Authorizing Provider  buPROPion (WELLBUTRIN XL) 150 MG 24 hr tablet Take 150 mg by mouth daily.   Yes [provider]  busPIRone (BUSPAR) 15 MG tablet Take 12 mg 2 (two) times daily by mouth.   Yes [provider]  lurasidone (LATUDA) 40 MG TABS tablet Take 1 tablet (40 mg total) daily with breakfast by mouth. 04/14/17  Yes Mikell, Antionette PolesAsiyah Zahra, MD  Melatonin 5 MG TABS Take 1 tablet by mouth at bedtime as needed. For sleep   Yes [provider]  norgestimate-ethinyl estradiol (ORTHO-CYCLEN,SPRINTEC,PREVIFEM) 0.25-35 MG-MCG tablet TAKE 1 TABLET BY MOUTH DAILY 05/02/17  Yes Constant, Peggy, MD  Phenyleph-CPM-DM-APAP (ALKA-SELTZER PLUS COLD & FLU PO) Take by mouth.   Yes [provider]  traZODone (DESYREL) 50 MG tablet Take 50 mg at bedtime by mouth.   Yes [provider]  benzonatate (TESSALON) 100 MG capsule Take 1 capsule (100 mg total) by mouth every 8 (eight) hours. 07/22/17   Cathie HoopsYu, Amy V, PA-C  benztropine (COGENTIN) 0.5 MG tablet Take 0.5 mg by mouth at bedtime.     [provider]  fluticasone (FLONASE) 50 MCG/ACT  nasal spray Place 2 sprays into both nostrils daily. 07/22/17   Cathie Hoops, Amy V, PA-C  ipratropium (ATROVENT) 0.06 % nasal spray Place 2 sprays into both nostrils 4 (four) times daily. 07/22/17   Cathie Hoops, Amy V, PA-C  meloxicam (MOBIC) 7.5 MG tablet Take 1 tablet (7.5 mg total) by mouth daily. 07/22/17   Belinda Fisher, PA-C    Family History Family History  Problem Relation Age of Onset  . Diabetes Mother   . Hypertension Mother   . Heart disease Mother 64  . Diabetes Maternal Grandmother   . Heart disease Maternal Grandmother   . Diabetes Maternal Grandfather   . Heart disease Maternal  Grandfather   . Breast cancer Neg Hx     Social History Social History   Tobacco Use  . Smoking status: Former Smoker    Types: Cigarettes    Last attempt to quit: 06/07/1999    Years since quitting: 18.1  . Smokeless tobacco: Never Used  Substance Use Topics  . Alcohol use: No  . Drug use: No     Allergies   Haldol [haloperidol lactate]; Risperidone and related; and Tramadol   Review of Systems Review of Systems  Reason unable to perform ROS: See HPI as above.     Physical Exam Triage Vital Signs ED Triage Vitals  Enc Vitals Group     BP 07/22/17 1528 138/80     Pulse Rate 07/22/17 1528 91     Resp --      Temp 07/22/17 1528 98.1 F (36.7 C)     Temp Source 07/22/17 1528 Oral     SpO2 07/22/17 1528 99 %     Weight --      Height --      Head Circumference --      Peak Flow --      Pain Score 07/22/17 1527 6     Pain Loc --      Pain Edu? --      Excl. in GC? --    No data found.  Updated Vital Signs BP 138/80 (BP Location: Left Arm)   Pulse 91   Temp 98.1 F (36.7 C) (Oral)   LMP 07/09/2017 (Approximate)   SpO2 99%   Physical Exam  Constitutional: She is oriented to person, place, and time. She appears well-developed and well-nourished. No distress.  HENT:  Head: Normocephalic and atraumatic.  Right Ear: Tympanic membrane, external ear and ear canal normal. Tympanic membrane is not erythematous and not bulging.  Left Ear: Tympanic membrane, external ear and ear canal normal. Tympanic membrane is not erythematous and not bulging.  Nose: Right sinus exhibits maxillary sinus tenderness and frontal sinus tenderness. Left sinus exhibits maxillary sinus tenderness and frontal sinus tenderness.  Mouth/Throat: Uvula is midline and mucous membranes are normal. Posterior oropharyngeal erythema present. No tonsillar exudate.  Eyes: Conjunctivae are normal. Pupils are equal, round, and reactive to light.  Neck: Normal range of motion. Neck supple.    Cardiovascular: Normal rate, regular rhythm and normal heart sounds. Exam reveals no gallop and no friction rub.  No murmur heard. Pulmonary/Chest: Effort normal and breath sounds normal. She has no decreased breath sounds. She has no wheezes. She has no rhonchi. She has no rales.  Lymphadenopathy:    She has no cervical adenopathy.  Neurological: She is alert and oriented to person, place, and time.  Skin: Skin is warm and dry.  Psychiatric: She has a normal mood and affect. Her behavior is  normal. Judgment normal.     UC Treatments / Results  Labs (all labs ordered are listed, but only abnormal results are displayed) Labs Reviewed  CULTURE, GROUP A STREP [facility])  POCT RAPID STREP A    EKG  EKG Interpretation None       Radiology No results found.  Procedures Procedures (including critical care time)  Medications Ordered in UC Medications - No data to display   Initial Impression / Assessment and Plan / UC Course  I have reviewed the triage vital signs and the nursing notes.  Pertinent labs & imaging results that were available during my care of the patient were reviewed by me and considered in my medical decision making (see chart for details).    Rapid strep negative. Patient nontoxic in appearance. Symptomatic treatment as needed. Return precautions given.   Final Clinical Impressions(s) / UC Diagnoses   Final diagnoses:  Viral illness    ED Discharge Orders        Ordered    benzonatate (TESSALON) 100 MG capsule  Every 8 hours     07/22/17 1550    meloxicam (MOBIC) 7.5 MG tablet  Daily     07/22/17 1550    fluticasone (FLONASE) 50 MCG/ACT nasal spray  Daily     07/22/17 1550    ipratropium (ATROVENT) 0.06 % nasal spray  4 times daily     07/22/17 1550        Belinda Fisher, [place] 07/22/17 1607

## 2017-07-25 LAB — CULTURE, GROUP A STREP (THRC)

## 2017-07-31 ENCOUNTER — Other Ambulatory Visit: Payer: Self-pay

## 2017-07-31 ENCOUNTER — Encounter (HOSPITAL_COMMUNITY): Payer: Self-pay | Admitting: *Deleted

## 2017-07-31 ENCOUNTER — Inpatient Hospital Stay (HOSPITAL_COMMUNITY)
Admission: AD | Admit: 2017-07-31 | Discharge: 2017-07-31 | Disposition: A | Discharge status: Home / Self Care | Payer: BLUE CROSS/BLUE SHIELD | Source: Ambulatory Visit | Attending: Obstetrics & Gynecology | Admitting: Obstetrics & Gynecology

## 2017-07-31 DIAGNOSIS — F319 Bipolar disorder, unspecified: Secondary | ICD-10-CM | POA: Diagnosis not present

## 2017-07-31 DIAGNOSIS — K5900 Constipation, unspecified: Secondary | ICD-10-CM | POA: Diagnosis not present

## 2017-07-31 DIAGNOSIS — F419 Anxiety disorder, unspecified: Secondary | ICD-10-CM | POA: Insufficient documentation

## 2017-07-31 DIAGNOSIS — Z87891 Personal history of nicotine dependence: Secondary | ICD-10-CM | POA: Diagnosis not present

## 2017-07-31 DIAGNOSIS — Z79899 Other long term (current) drug therapy: Secondary | ICD-10-CM | POA: Insufficient documentation

## 2017-07-31 DIAGNOSIS — R102 Pelvic and perineal pain: Secondary | ICD-10-CM | POA: Diagnosis not present

## 2017-07-31 DIAGNOSIS — R109 Unspecified abdominal pain: Secondary | ICD-10-CM | POA: Diagnosis present

## 2017-07-31 LAB — CBC WITH DIFFERENTIAL/PLATELET
Basophils Absolute: 0 10*3/uL (ref 0.0–0.1)
Basophils Relative: 0 %
Eosinophils Absolute: 0.1 10*3/uL (ref 0.0–0.7)
Eosinophils Relative: 1 %
HCT: 34.3 % — ABNORMAL LOW (ref 36.0–46.0)
Hemoglobin: 11.5 g/dL — ABNORMAL LOW (ref 12.0–15.0)
Lymphocytes Relative: 27 %
Lymphs Abs: 1.9 10*3/uL (ref 0.7–4.0)
MCH: 29.1 pg (ref 26.0–34.0)
MCHC: 33.5 g/dL (ref 30.0–36.0)
MCV: 86.8 fL (ref 78.0–100.0)
Monocytes Absolute: 0.3 10*3/uL (ref 0.1–1.0)
Monocytes Relative: 4 %
Neutro Abs: 4.7 10*3/uL (ref 1.7–7.7)
Neutrophils Relative %: 68 %
Platelets: 244 10*3/uL (ref 150–400)
RBC: 3.95 MIL/uL (ref 3.87–5.11)
RDW: 12.8 % (ref 11.5–15.5)
WBC: 7 10*3/uL (ref 4.0–10.5)

## 2017-07-31 LAB — URINALYSIS, ROUTINE W REFLEX MICROSCOPIC
Bilirubin Urine: NEGATIVE
Glucose, UA: NEGATIVE mg/dL
Hgb urine dipstick: NEGATIVE
Ketones, ur: NEGATIVE mg/dL
Nitrite: NEGATIVE
Protein, ur: NEGATIVE mg/dL
Specific Gravity, Urine: 1.017 (ref 1.005–1.030)
pH: 5 (ref 5.0–8.0)

## 2017-07-31 LAB — COMPREHENSIVE METABOLIC PANEL
ALT: 30 U/L (ref 14–54)
AST: 22 U/L (ref 15–41)
Albumin: 3.5 g/dL (ref 3.5–5.0)
Alkaline Phosphatase: 55 U/L (ref 38–126)
Anion gap: 7 (ref 5–15)
BUN: 9 mg/dL (ref 6–20)
CO2: 23 mmol/L (ref 22–32)
Calcium: 8.6 mg/dL — ABNORMAL LOW (ref 8.9–10.3)
Chloride: 105 mmol/L (ref 101–111)
Creatinine, Ser: 0.74 mg/dL (ref 0.44–1.00)
GFR calc Af Amer: >60 mL/min (ref >60)
GFR calc non Af Amer: >60 mL/min (ref >60)
Glucose, Bld: 98 mg/dL (ref 65–99)
Potassium: 3.8 mmol/L (ref 3.5–5.1)
Sodium: 135 mmol/L (ref 135–145)
Total Bilirubin: 0.5 mg/dL (ref 0.3–1.2)
Total Protein: 6.7 g/dL (ref 6.5–8.1)

## 2017-07-31 LAB — POCT PREGNANCY, URINE: Preg Test, Ur: NEGATIVE

## 2017-07-31 MED ORDER — IBUPROFEN 600 MG PO TABS: 600.0000 mg | ORAL_TABLET | Freq: Four times a day (QID) | ORAL | 1 refills | Status: DC | PRN

## 2017-07-31 MED ORDER — KETOROLAC TROMETHAMINE 60 MG/2ML IM SOLN
60.0000 mg | Freq: Once | INTRAMUSCULAR | Status: AC
  Administered 2017-07-31: 60 mg via INTRAMUSCULAR
  Filled 2017-07-31: qty 2

## 2017-07-31 NOTE — MAU Note (Signed)
[facility].

## 2017-07-31 NOTE — MAU Provider Note (Signed)
Chief Complaint:  Abdominal Pain   First Provider Initiated Contact with Patient 07/31/17 1140    HPI: TRUE Kelly Carr is a 47 y.o. [id number] who presents to maternity admissions reporting pelvic pain.  Has been having it off and on since December, but has worsened lately and especially since last night.  No abnormal bleeding. Is on OCPs.  Last [place] in November was normal.  Does have nausea with this pain.  Has longstanding problem with constipation.  Last BM Saturday required Miralax.  Before that it had been a week..States not concerned about STDs. She reports vaginal bleeding, vaginal itching/burning, urinary symptoms, h/a, dizziness, vomiting, or fever/chills.    Is very tearful.  Reports "high amount of stress" but will not elaborate. States was told in 2014 that her abdominal pain was due to stress.    Denies abuse or violence.  Abdominal Pain  This is a recurrent problem. The current episode started more than 1 month ago. The onset quality is gradual. The problem occurs constantly. The problem has been gradually worsening. The pain is located in the RLQ, LLQ and suprapubic region. The pain is moderate. The quality of the pain is cramping and aching. The abdominal pain does not radiate. Associated symptoms include anorexia, constipation, dysuria (off and on) and nausea. Pertinent negatives include no diarrhea, fever, frequency, headaches, hematochezia, hematuria, myalgias or vomiting. The pain is aggravated by palpation (intercourse). The pain is relieved by nothing. She has tried nothing for the symptoms.    RN Note: Been having a lot of pain like where her right ovary is, started last night.  Called dr, could not be seen until [facility], was told to come here if could not wait.  Pt tearful in triage, states is because of the pain    Past Medical History: Past Medical History:  Diagnosis Date  . Abnormal Pap smear    Unknown results>colpo>normal  . Anxiety   . Bipolar 1 disorder (HCC)      Past obstetric history: OB History  Gravida Para Term Preterm AB Living  4 2 2   2 2   SAB TAB Ectopic Multiple Live Births  2       2    # Outcome Date GA Lbr Len/2nd Weight Sex Delivery Anes PTL Lv  4 Term 08/12/00    F Vag-Spont   LIV  3 Term 05/10/95    M Vag-Spont   LIV  2 SAB           1 SAB               Past Surgical History: History reviewed. No pertinent surgical history.  Family History: Family History  Problem Relation Age of Onset  . Diabetes Mother   . Hypertension Mother   . Heart disease Mother 61  . Diabetes Maternal Grandmother   . Heart disease Maternal Grandmother   . Diabetes Maternal Grandfather   . Heart disease Maternal Grandfather   . Breast cancer Neg Hx     Social History: Social History   Tobacco Use  . Smoking status: Former Smoker    Types: Cigarettes    Last attempt to quit: 06/07/1999    Years since quitting: 18.1  . Smokeless tobacco: Never Used  Substance Use Topics  . Alcohol use: No  . Drug use: No    Allergies:  Allergies  Allergen Reactions  . Haldol [Haloperidol Lactate] Other (See Comments)    Syncope   . Risperidone And Related Other (See Comments)  Zones out  . Tramadol Itching    Meds:  Medications Prior to Admission  Medication Sig Dispense Refill Last Dose  . benzonatate (TESSALON) 100 MG capsule Take 1 capsule (100 mg total) by mouth every 8 (eight) hours. 21 capsule 0   . benztropine (COGENTIN) 0.5 MG tablet Take 0.5 mg by mouth at bedtime.    Taking  . buPROPion (WELLBUTRIN XL) 150 MG 24 hr tablet Take 150 mg by mouth daily.   07/22/2017 at Unknown time  . busPIRone (BUSPAR) 15 MG tablet Take 12 mg 2 (two) times daily by mouth.   07/22/2017 at Unknown time  . fluticasone (FLONASE) 50 MCG/ACT nasal spray Place 2 sprays into both nostrils daily. 1 g 0   . ipratropium (ATROVENT) 0.06 % nasal spray Place 2 sprays into both nostrils 4 (four) times daily. 15 mL 0   . lurasidone (LATUDA) 40 MG TABS tablet  Take 1 tablet (40 mg total) daily with breakfast by mouth. 30 tablet  07/22/2017 at Unknown time  . Melatonin 5 MG TABS Take 1 tablet by mouth at bedtime as needed. For sleep   07/21/2017 at Unknown time  . meloxicam (MOBIC) 7.5 MG tablet Take 1 tablet (7.5 mg total) by mouth daily. 15 tablet 0   . norgestimate-ethinyl estradiol (ORTHO-CYCLEN,SPRINTEC,PREVIFEM) 0.25-35 MG-MCG tablet TAKE 1 TABLET BY MOUTH DAILY 1 Package 13 07/22/2017 at Unknown time  . Phenyleph-CPM-DM-APAP (ALKA-SELTZER PLUS COLD & FLU PO) Take by mouth.   07/22/2017 at Unknown time  . traZODone (DESYREL) 50 MG tablet Take 50 mg at bedtime by mouth.   07/21/2017 at Unknown time    I have reviewed patient's Past Medical Hx, Surgical Hx, Family Hx, Social Hx, medications and allergies.  ROS:  Review of Systems  Constitutional: Negative for fever.  Gastrointestinal: Positive for abdominal pain, anorexia, constipation and nausea. Negative for diarrhea, hematochezia and vomiting.  Genitourinary: Positive for dysuria (off and on). Negative for frequency and hematuria.  Musculoskeletal: Negative for myalgias.  Neurological: Negative for headaches.   Other systems negative     Physical Exam   Patient Vitals for the past 24 hrs:  BP Temp Temp src Pulse Resp SpO2 Weight  07/31/17 1112 121/90 98.2 F (36.8 C) Oral 88 17 100 % 138 lb 8 oz (62.8 kg)   Constitutional: Well-developed, well-nourished female in no acute distress.  Cardiovascular: normal rate and rhythm, no ectopy audible, S1 & S2 heard, no murmur Respiratory: normal effort, no distress. Lungs CTAB with no wheezes or crackles GI: Abd soft, non-tender.  Nondistended.  No rebound, No guarding.  Bowel Sounds audible  MS: Extremities nontender, no edema, normal ROM Neurologic: Alert and oriented x 4.   Grossly nonfocal. GU: Neg CVAT. Skin:  Warm and Dry Psych:  Affect appropriate.  PELVIC EXAM: Bimanual exam: Cervix firm, posterior, neg CMT, uterus tender, adnexa with  tenderness on right, but no enlargement, or mass    Labs: Results for orders placed or performed during the hospital encounter of 07/31/17 (from the past 24 hour(s))  Urinalysis, Routine w reflex microscopic     Status: Abnormal   Collection Time: 07/31/17 11:14 AM  Result Value Ref Range   Color, Urine YELLOW YELLOW   APPearance HAZY (A) CLEAR   Specific Gravity, Urine 1.017 1.005 - 1.030   pH 5.0 5.0 - 8.0   Glucose, UA NEGATIVE NEGATIVE mg/dL   Hgb urine dipstick NEGATIVE NEGATIVE   Bilirubin Urine NEGATIVE NEGATIVE   Ketones, ur NEGATIVE NEGATIVE mg/dL  Protein, ur NEGATIVE NEGATIVE mg/dL   Nitrite NEGATIVE NEGATIVE   Leukocytes, UA TRACE (A) NEGATIVE   RBC / HPF 0-5 0 - 5 RBC/hpf   WBC, UA 0-5 0 - 5 WBC/hpf   Bacteria, UA RARE (A) NONE SEEN   Squamous Epithelial / LPF 6-30 (A) NONE SEEN   Mucus PRESENT   Pregnancy, urine POC     Status: None   Collection Time: 07/31/17 11:21 AM  Result Value Ref Range   Preg Test, Ur NEGATIVE NEGATIVE      Imaging:  No results found.  MAU Course/MDM: I have ordered labs as follows: CBC and CMET. Urine is essentially negative.  Low suspicion for UTI or stone.  Declines STD testing. CBC and CMET normal Imaging ordered: none. Per Dr Vergie Living, will do in office if indicated    Treatments in MAU included Toradol.   Pt stable at time of discharge.  Assessment: Pelvic pain, mostly right lower quadrant   Plan: Discharge home Rx Motrin for pelvic pain Has appointment Thursday with Dr Vergie Living. Encouraged to return here or to other Urgent Care/ED if she develops worsening of symptoms, increase in pain, fever, or other concerning symptoms.   Wynelle Bourgeois CNM, MSN Certified Nurse-Midwife 07/31/2017 11:56 AM

## 2017-07-31 NOTE — Discharge Instructions (Signed)
Pelvic Pain, Female Pelvic pain is pain in your lower abdomen, below your belly button and between your hips. The pain may start suddenly (acute), keep coming back (recurring), or last a long time (chronic). Pelvic pain that lasts longer than six months is considered chronic. Pelvic pain may affect your:  Reproductive organs.  Urinary system.  Digestive tract.  Musculoskeletal system.  There are many potential causes of pelvic pain. Sometimes, the pain can be a result of digestive or urinary conditions, strained muscles or ligaments, or even reproductive conditions. Sometimes the cause of pelvic pain is not known. Follow these instructions at home:  Take over-the-counter and prescription medicines only as told by your health care provider.  Rest as told by your health care provider.  Do not have sex it if hurts.  Keep a journal of your pelvic pain. Write down: ? When the pain started. ? Where the pain is located. ? What seems to make the pain better or worse, such as food or your menstrual cycle. ? Any symptoms you have along with the pain.  Keep all follow-up visits as told by your health care provider. This is important. Contact a health care provider if:  Medicine does not help your pain.  Your pain comes back.  You have new symptoms.  You have abnormal vaginal discharge or bleeding, including bleeding after menopause.  You have a fever or chills.  You are constipated.  You have blood in your urine or stool.  You have foul-smelling urine.  You feel weak or lightheaded. Get help right away if:  You have sudden severe pain.  Your pain gets steadily worse.  You have severe pain along with fever, nausea, vomiting, or excessive sweating.  You lose consciousness. This information is not intended to replace advice given to you by your health care provider. Make sure you discuss any questions you have with your health care provider. Document Released: 04/19/2004  Document Revised: 06/17/2015 Document Reviewed: 03/13/2015 Elsevier Interactive Patient Education  2018 Elsevier Inc.  

## 2017-07-31 NOTE — MAU Note (Addendum)
Been having a lot of pain like where her right ovary is, started last night.  [facility] dr[facility], was told to come here if could not wait.  Pt tearful in triage, states is because of the pain

## 2017-08-03 ENCOUNTER — Encounter: Payer: Self-pay | Admitting: Obstetrics and Gynecology

## 2017-08-03 ENCOUNTER — Ambulatory Visit: Payer: BLUE CROSS/BLUE SHIELD | Admitting: Obstetrics and Gynecology

## 2017-08-03 VITALS — BP 141/76 | HR 72 | Wt 131.6 lb

## 2017-08-03 DIAGNOSIS — R1031 Right lower quadrant pain: Secondary | ICD-10-CM

## 2017-08-03 NOTE — Progress Notes (Signed)
Obstetrics and Gynecology Established Patient Evaluation  Appointment Date: 08/03/2017  OBGYN Clinic: [facility]  Primary Care Provider: Berton Bon  Referring Provider: MAU  Chief Complaint: RLQ pain History of Present Illness: Kelly Carr is a 47 y.o. Caucasian [id number] (Patient's last menstrual period was 07/09/2017 (approximate).), seen for the above chief complaint.   Patient went to MAU on 2/25 for 1d h/o worsening RLQ throbbing pain. She states that she had it in the past and believes it was due to stress and/or ovarian cysts. UPT, u/a, cmp, cbc and exam negative in MAU and pt set up for outpatient follow up. Pt is on OCPs and is on the last week of the active pills before her placebo ones.   Patient states pain is still there but is much improved from the mau.    Review of Systems: as noted in the History of Present Illness.  Past Medical History:  Past Medical History:  Diagnosis Date  . Abnormal Pap smear    Unknown results>colpo>normal  . Anxiety   . Bipolar 1 disorder (HCC)     Past Surgical History:  No past surgical history on file.  Past Obstetrical History:  OB History  Gravida Para Term Preterm AB Living  4 2 2   2 2   SAB TAB Ectopic Multiple Live Births  2       2    # Outcome Date GA Lbr Len/2nd Weight Sex Delivery Anes PTL Lv  4 Term 08/12/00    F Vag-Spont   LIV  3 Term 05/10/95    M Vag-Spont   LIV  2 SAB           1 SAB               Past Gynecological History: As per HPI. Periods: qmonth, regular.  History of Pap Smear(s): Yes.   Last pap 2018, which was NILM/HPV HR negative She is currently using OCPs for contraception.   Social History:  Social History   Socioeconomic History  . Marital status: Single    Spouse name: Not on file  . Number of children: Not on file  . Years of education: Not on file  . Highest education level: Not on file  Social Needs  . Financial resource strain: Not on  file  . Food insecurity - worry: Not on file  . Food insecurity - inability: Not on file  . Transportation needs - medical: Not on file  . Transportation needs - non-medical: Not on file  Occupational History  . Not on file  Tobacco Use  . Smoking status: Former Smoker    Types: Cigarettes    Last attempt to quit: 06/07/1999    Years since quitting: 18.1  . Smokeless tobacco: Never Used  Substance and Sexual Activity  . Alcohol use: No  . Drug use: No  . Sexual activity: Yes    Partners: Male    Birth control/protection: Pill  Other Topics Concern  . Not on file  Social History Narrative  . Not on file    Family History:  Family History  Problem Relation Age of Onset  . Diabetes Mother   . Hypertension Mother   . Heart disease Mother 29  . Diabetes Maternal Grandmother   . Heart disease Maternal Grandmother   . Diabetes Maternal Grandfather   . Heart disease Maternal Grandfather   . Breast cancer Neg Hx     Medications Shirlena Manke had no medications  administered during this visit. Current Outpatient Medications  Medication Sig Dispense Refill  . benztropine (COGENTIN) 0.5 MG tablet Take 0.5 mg by mouth at bedtime.     Marland Kitchen. buPROPion (WELLBUTRIN XL) 150 MG 24 hr tablet Take 150 mg by mouth daily.    . busPIRone (BUSPAR) 15 MG tablet Take 15-30 mg by mouth 2 (two) times daily. Pt takes 30 mg in the AM and 15 mg in the PM.    . Lurasidone HCl (LATUDA) 60 MG TABS Take 60 mg by mouth daily.    . Melatonin 5 MG TABS Take 1 tablet by mouth at bedtime as needed. For sleep    . norgestimate-ethinyl estradiol (ORTHO-CYCLEN,SPRINTEC,PREVIFEM) 0.25-35 MG-MCG tablet TAKE 1 TABLET BY MOUTH DAILY 1 Package 13  . traZODone (DESYREL) 50 MG tablet Take 50 mg at bedtime by mouth.    . benzonatate (TESSALON) 100 MG capsule Take 1 capsule (100 mg total) by mouth every 8 (eight) hours. (Patient not taking: Reported on 07/31/2017) 21 capsule 0  . fluticasone (FLONASE) 50 MCG/ACT nasal spray  Place 2 sprays into both nostrils daily. (Patient not taking: Reported on 08/03/2017) 1 g 0  . ibuprofen (ADVIL,MOTRIN) 600 MG tablet Take 1 tablet (600 mg total) by mouth every 6 (six) hours as needed. (Patient not taking: Reported on 08/03/2017) 30 tablet 1  . ipratropium (ATROVENT) 0.06 % nasal spray Place 2 sprays into both nostrils 4 (four) times daily. (Patient not taking: Reported on 08/03/2017) 15 mL 0  . meloxicam (MOBIC) 7.5 MG tablet Take 1 tablet (7.5 mg total) by mouth daily. (Patient not taking: Reported on 08/03/2017) 15 tablet 0  . Phenyleph-CPM-DM-APAP (ALKA-SELTZER PLUS COLD & FLU PO) Take by mouth.     No current facility-administered medications for this visit.     Allergies Haldol [haloperidol lactate]; Risperidone and related; and Tramadol   Physical Exam:  BP (!) 141/76   Pulse 72   Wt 131 lb 9.6 oz (59.7 kg)   LMP 07/09/2017 (Approximate)   BMI 22.59 kg/m  Body mass index is 22.59 kg/m. General appearance: Well nourished, well developed female in no acute distress.   Laboratory: none  Radiology: none  Assessment: pt improved  Plan:  D/w her that s/s are improved and offered u/s to her but I don't think it's absolutely necessary; pt declines. I told her that unlikely due to cysts given how long and how good she is with taking OCPs. If s/s recur or worsen, pt to call and let us know and I would recommend u/s at that time.   >50% of 15 min encounter in face to face time with patient  RTC PRN  Cornelia Copaharlie Carlie Corpus, Jr MD Attending [facility](Faculty Practice)

## 2017-08-09 ENCOUNTER — Encounter: Payer: Self-pay | Admitting: *Deleted

## 2017-08-09 ENCOUNTER — Ambulatory Visit: Payer: BLUE CROSS/BLUE SHIELD | Admitting: *Deleted

## 2017-08-09 VITALS — BP 143/74

## 2017-08-09 DIAGNOSIS — R1031 Right lower quadrant pain: Secondary | ICD-10-CM

## 2017-08-09 NOTE — Progress Notes (Signed)
I have reviewed the CMA documentation and agree with documentation of visit.

## 2017-08-09 NOTE — Progress Notes (Signed)
Subjective:  Kelly Carr is a 47 y.o. female here for BP check. Currently on OCP's.   Objective:  BP (!) 143/74   Appearance alert, well appearing, and in no distress. General exam BP noted to be well controlled today in office.    Assessment:   Blood Pressure elevated.   Plan:  Follow up with Dr. Vergie LivingPickens as scheduled. .Marland Kitchen

## 2017-08-14 LAB — CULTURE, URINE COMPREHENSIVE

## 2017-08-15 ENCOUNTER — Other Ambulatory Visit: Payer: Self-pay | Admitting: *Deleted

## 2017-08-15 MED ORDER — CEPHALEXIN 500 MG PO CAPS: 500.0000 mg | ORAL_CAPSULE | Freq: Two times a day (BID) | ORAL | 0 refills | Status: AC

## 2017-08-16 ENCOUNTER — Ambulatory Visit: Payer: Self-pay | Admitting: Obstetrics and Gynecology

## 2017-08-21 ENCOUNTER — Other Ambulatory Visit: Payer: Self-pay

## 2017-08-21 ENCOUNTER — Encounter: Payer: Self-pay | Admitting: Obstetrics and Gynecology

## 2017-08-21 ENCOUNTER — Ambulatory Visit (INDEPENDENT_AMBULATORY_CARE_PROVIDER_SITE_OTHER): Payer: BLUE CROSS/BLUE SHIELD | Admitting: Obstetrics and Gynecology

## 2017-08-21 VITALS — BP 130/74 | HR 72 | Wt 128.6 lb

## 2017-08-21 DIAGNOSIS — R030 Elevated blood-pressure reading, without diagnosis of hypertension: Secondary | ICD-10-CM

## 2017-08-21 DIAGNOSIS — R109 Unspecified abdominal pain: Secondary | ICD-10-CM | POA: Diagnosis not present

## 2017-08-21 MED ORDER — NORGESTIMATE-ETH ESTRADIOL 0.25-35 MG-MCG PO TABS: 1.0000 | ORAL_TABLET | Freq: Every day | ORAL | 13 refills | Status: DC

## 2017-08-21 NOTE — Progress Notes (Signed)
Obstetrics and Gynecology Visit Return Patient Evaluation  Appointment Date: 08/21/2017  Primary Care Provider: Berton BonMikell, Asiyah Zahra  Chief Complaint: birth control and f/u abdominal pain  History of Present Illness:  Patient states that lower abdominal discomfort is much improved and that she is still finishing up her abx for her UTI. She states, as well, that her mood is improved. She states that in the past few weeks she felt that she might be starting to have another bipolar episode, which is odd for her, since she hasn't had one since 2014 and had gone about 10 years prior to that before having one, but she felt the s/s approaching and started not taking her meds and taking care of herself as she should, but that with the help of family, friends and her behavioral health providers that she is back on track.    Review of Systems: as noted in the History of Present Illness.  Medications: reviewed  Allergies: is allergic to haldol [haloperidol lactate]; risperidone and related; and tramadol.  Physical Exam:  BP 130/74   Pulse 72   Wt 128 lb 9.6 oz (58.3 kg)   BMI 22.07 kg/m  Body mass index is 22.07 kg/m. General appearance: Well nourished, well developed female in no acute distress.  Neuro/Psych:  Normal mood and affect.  Dressed normally  Assessment: pt doing well  Plan: BP normal today and her episodes of slightly high BPs were during the above noted period. She has been on sprintec for several years and has no contraindications to combined OCP use; given her psych history, I wouldn't want to change up her BC method unless it was absolutely necessary. Given this, will have her come back in a month for BP check and urine culture test of cure RN visit and if BP is still normal, she is fine to continue  RTC: 5534m  Cornelia Copaharlie Preet Perrier, Jr MD Attending Center for [facility](Faculty Practice)

## 2017-08-21 NOTE — Progress Notes (Signed)
Lost birth control pills

## 2017-09-13 ENCOUNTER — Encounter: Payer: Self-pay | Admitting: Cardiology

## 2017-09-13 ENCOUNTER — Ambulatory Visit: Payer: BLUE CROSS/BLUE SHIELD | Admitting: Cardiology

## 2017-09-13 VITALS — BP 118/74 | HR 86 | Ht 64.0 in | Wt 133.0 lb

## 2017-09-13 DIAGNOSIS — R079 Chest pain, unspecified: Secondary | ICD-10-CM

## 2017-09-13 NOTE — Progress Notes (Signed)
   [facility] Phone: [phone]   Date of Visit: 09/14/2017   HPI:  Right Ear Pain: - started when she woke up last Tuesday or Wednesday  - worse with chewing food  - no tinnitus, decreased hearing, ear drainage - no fevers, no nasal congestion, sore throat or rhinorrhea  - has tried Tylenol PRN which helps a little - no tooth pain   ROS: See HPI.  PMFSH:  PMH: GERD HLD Bipolar DO, Depression  PHYSICAL EXAM: BP 112/76   Pulse 92   Temp 98.1 F (36.7 C) (Oral)   Wt 130 lb (59 kg)   SpO2 99%   BMI 22.31 kg/m  GEN: NAD HEENT:  neck supple, EOMI, sclera clear, tympanic membranes normal bilaterally, no pain with movement of the ear bilaterally. Tenderness to palpation of the right TMJ joint at rest and with movement of jaw. Normal range of motion of jaw. Oropharynx normal.  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 Will do a course of NSAID, Naproxen 500mg  BID x 10 days (with meal). Patient to stop medication and call if she gets GI upset with this. Also provided jaw exercises.  Follow up if symptoms do not improve.   Palma HolterKanishka G Gunadasa, MD PGY 3 [facility]

## 2017-09-13 NOTE — Patient Instructions (Signed)
Medication Instructions:  Your physician recommends that you continue on your current medications as directed. Please refer to the Current Medication list given to you today.  Labwork: None ordered   Testing/Procedures: Your physician has requested that you have an echocardiogram. Echocardiography is a painless test that uses sound waves to create images of your heart. It provides your doctor with information about the size and shape of your heart and how well your heart's chambers and valves are working. This procedure takes approximately one hour. There are no restrictions for this procedure.  Your physician has requested that you have en exercise stress myoview. For further information please visit [email or web address]. Please follow instruction sheet, as given.   Follow-Up: Your physician wants you to follow-up in: 1 year with Dr. Mayford Knifeurner. You will receive a reminder letter in the mail two months in advance. If you don't receive a letter, please call our office to schedule the follow-up appointment.  Any Other Special Instructions Will Be Listed Below (If Applicable).    Thank you for choosing [facility]    Lyda PeroneRena Dillon Mcreynolds, RN  [phone]  If you need a refill on your cardiac medications before your next appointment, please call your pharmacy.

## 2017-09-13 NOTE — Progress Notes (Signed)
Cardiology Office Note    Date:  09/13/2017   ID:  Kelly Carr, DOB 06/06/1971, MRN [id number]  PCP:  Berton BonMikell, Asiyah Zahra, MD  Cardiologist:  Armanda Magicraci Elliet Goodnow, MD   Chief Complaint  Patient presents with  . Chest Pain    History of Present Illness:  Kelly Carr is a 47 y.o. female who is being seen today for the evaluation of chest pain at the request of Berton BonMikell, Asiyah Zahra, MD.  This is a 47 year old female with a history of bipolar disorder and anxiety.  She also has a history of GERD.  Back in July 2018 she developed sharp central pleuritic chest pain radiating to the right chest that awakened her from sleep associated with mild shortness of breath and nausea.  She also had chronic epigastric and right upper quadrant pain as well.  She had been seen by her PCP in the past apparently was supposed to have a stress test done.  Given this episode of chest pain she presented to the emergency room her workup was normal.  Chest x-ray at that time was suspicious for pneumonia and she was treated with antibiotics.  She is now here for further evaluation.  She says she is continued to have episodic chest pain and pressure.  She describes an electrical sharp pain that only last for a few seconds but then she has a pressure associated with it across her chest in the central area.  There is no radiation of this discomfort.  There are no associated symptoms of shortness of breath, nausea, vomiting or diaphoresis.  Denies any dyspnea on exertion, dizziness, palpitations or syncope.  She thinks her symptoms may be due to increased anxiety.  She is concerned because she has a very strong family health coronary disease on her mom's side of the family.  Her mother had her first MI at 1540 and has had several since then including CABG. she has several uncles on her mom's side who have had MIs as well.    Past Medical History:  Diagnosis Date  . Abnormal Pap smear    Unknown results>colpo>normal  .  Anxiety   . Bipolar 1 disorder (HCC)     History reviewed. No pertinent surgical history.  Current Medications: Current Meds  Medication Sig  . benztropine (COGENTIN) 0.5 MG tablet Take 0.5 mg by mouth at bedtime.   Marland Kitchen. buPROPion (WELLBUTRIN XL) 150 MG 24 hr tablet Take 150 mg by mouth daily.  . busPIRone (BUSPAR) 15 MG tablet Take 15-30 mg by mouth 2 (two) times daily. Pt takes 30 mg in the AM and 15 mg in the PM.  . fluticasone (FLONASE) 50 MCG/ACT nasal spray Place 2 sprays into both nostrils daily.  Marland Kitchen. ibuprofen (ADVIL,MOTRIN) 600 MG tablet Take 1 tablet (600 mg total) by mouth every 6 (six) hours as needed.  . Melatonin 5 MG TABS Take 1 tablet by mouth at bedtime as needed. For sleep  . meloxicam (MOBIC) 7.5 MG tablet Take 1 tablet (7.5 mg total) by mouth daily.  . norgestimate-ethinyl estradiol (ORTHO-CYCLEN,SPRINTEC,PREVIFEM) 0.25-35 MG-MCG tablet Take 1 tablet by mouth daily.  Marland Kitchen. Phenyleph-CPM-DM-APAP (ALKA-SELTZER PLUS COLD & FLU PO) Take by mouth.  . traZODone (DESYREL) 50 MG tablet Take 50 mg at bedtime by mouth.  . ziprasidone (GEODON) 80 MG capsule TAKE ONE CAPSULE BY MOUTH TWICE PER DAY.    Allergies:   Haldol [haloperidol lactate]; Risperidone and related; and Tramadol   Social History   Socioeconomic History  . Marital  status: Single    Spouse name: Not on file  . Number of children: Not on file  . Years of education: Not on file  . Highest education level: Not on file  Occupational History  . Not on file  Social Needs  . Financial resource strain: Not on file  . Food insecurity:    Worry: Not on file    Inability: Not on file  . Transportation needs:    Medical: Not on file    Non-medical: Not on file  Tobacco Use  . Smoking status: Former Smoker    Types: Cigarettes    Last attempt to quit: 06/07/1999    Years since quitting: 18.2  . Smokeless tobacco: Never Used  Substance and Sexual Activity  . Alcohol use: No  . Drug use: No  . Sexual activity: Yes     Partners: Male    Birth control/protection: Pill  Lifestyle  . Physical activity:    Days per week: Not on file    Minutes per session: Not on file  . Stress: Not on file  Relationships  . Social connections:    Talks on phone: Not on file    Gets together: Not on file    Attends religious service: Not on file    Active member of club or organization: Not on file    Attends meetings of clubs or organizations: Not on file    Relationship status: Not on file  Other Topics Concern  . Not on file  Social History Narrative  . Not on file     Family History:  The patient's family history includes Diabetes in her maternal grandfather, maternal grandmother, and mother; Heart disease in her maternal grandfather and maternal grandmother; Heart disease (age of onset: 56) in her mother; Hypertension in her mother.   ROS:   Please see the history of present illness.    ROS All other systems reviewed and are negative.  No flowsheet data found.     PHYSICAL EXAM:   VS:  BP 118/74   Pulse 86   Ht 5\' 4"  (1.626 m)   Wt 133 lb (60.3 kg)   SpO2 99%   BMI 22.83 kg/m    GEN: Well nourished, well developed, in no acute distress  HEENT: normal  Neck: no JVD, carotid bruits, or masses Cardiac: RRR; no murmurs, rubs, or gallops,no edema.  Intact distal pulses bilaterally.  Respiratory:  clear to auscultation bilaterally, normal work of breathing GI: soft, nontender, nondistended, + BS MS: no deformity or atrophy  Skin: warm and dry, no rash Neuro:  Alert and Oriented x 3, Strength and sensation are intact Psych: euthymic mood, full affect  Wt Readings from Last 3 Encounters:  09/13/17 133 lb (60.3 kg)  08/21/17 128 lb 9.6 oz (58.3 kg)  08/03/17 131 lb 9.6 oz (59.7 kg)      Studies/Labs Reviewed:   EKG:  EKG is not ordered today.    Recent Labs: 07/31/2017: ALT 30; BUN 9; Creatinine, Ser 0.74; Hemoglobin 11.5; Platelets 244; Potassium 3.8; Sodium 135   Lipid Panel    Component  Value Date/Time   CHOL 172 05/20/2016 1429   TRIG 81 05/20/2016 1429   HDL 55 05/20/2016 1429   CHOLHDL 3.1 05/20/2016 1429   VLDL 16 05/20/2016 1429   LDLCALC 101 (H) 05/20/2016 1429    Additional studies/ records that were reviewed today include:  Hospital notes from July 2018    ASSESSMENT:    1. Chest  pain, unspecified type      PLAN:  In order of problems listed above:  1. Chest pain -her symptoms are atypical but she has a very strong family history of premature coronary disease on her mom's side of the family.  She also intermittently smokes off and on.  She is also had hyperlipidemia in the past and has been on statins but not any recently.  Her last documented LDL was 101 on 05/20/2016.  I am going to set her up for a stress Myoview to rule out ischemia as well as a 2D echocardiogram to make sure structurally her heart is normal.  I did suggest a chest CT for calcium score but at this time she wants to hold off and save the money to get this done.  She will let us know when she wants to get it done.  I will otherwise see her back in 1 year.    Medication Adjustments/Labs and Tests Ordered: Current medicines are reviewed at length with the patient today.  Concerns regarding medicines are outlined above.  Medication changes, Labs and Tests ordered today are listed in the Patient Instructions below.  There are no Patient Instructions on file for this visit.   Signed, Armanda Magic, MD  09/13/2017 10:48 AM    [facility] [address] [place], [place], [place]  [address] Phone: [phone]; Fax: [phone]

## 2017-09-14 ENCOUNTER — Encounter: Payer: Self-pay | Admitting: Internal Medicine

## 2017-09-14 ENCOUNTER — Ambulatory Visit (INDEPENDENT_AMBULATORY_CARE_PROVIDER_SITE_OTHER): Payer: BLUE CROSS/BLUE SHIELD | Admitting: Internal Medicine

## 2017-09-14 VITALS — BP 112/76 | HR 92 | Temp 98.1°F | Wt 130.0 lb

## 2017-09-14 DIAGNOSIS — M26609 Unspecified temporomandibular joint disorder, unspecified side: Secondary | ICD-10-CM | POA: Diagnosis not present

## 2017-09-14 MED ORDER — NAPROXEN 500 MG PO TBEC: 500.0000 mg | DELAYED_RELEASE_TABLET | Freq: Two times a day (BID) | ORAL | 0 refills | Status: DC

## 2017-09-14 NOTE — Patient Instructions (Signed)
Let's do a trial of Naproxen 500mg  twice a day for 10 days.  Please try the Jaw exercises a few days after starting the Naproxen   Temporomandibular Joint Syndrome Temporomandibular joint (TMJ) syndrome is a condition that affects the joints between your jaw and your skull. The TMJs are located near your ears and allow your jaw to open and close. These joints and the nearby muscles are involved in all movements of the jaw. People with TMJ syndrome have pain in the area of these joints and muscles. Chewing, biting, or other movements of the jaw can be difficult or painful. TMJ syndrome can be caused by various things. In many cases, the condition is mild and goes away within a few weeks. For some people, the condition can become a long-term problem. What are the causes? Possible causes of TMJ syndrome include:  Grinding your teeth or clenching your jaw. Some people do this when they are under stress.  Arthritis.  Injury to the jaw.  Head or neck injury.  Teeth or dentures that are not aligned well.  In some cases, the cause of TMJ syndrome may not be known. What are the signs or symptoms? The most common symptom is an aching pain on the side of the head in the area of the TMJ. Other symptoms may include:  Pain when moving your jaw, such as when chewing or biting.  Being unable to open your jaw all the way.  Making a clicking sound when you open your mouth.  Headache.  Earache.  Neck or shoulder pain.  How is this diagnosed? Diagnosis can usually be made based on your symptoms, your medical history, and a physical exam. Your health care provider may check the range of motion of your jaw. Imaging tests, such as X-rays or an MRI, are sometimes done. You may need to see your dentist to determine if your teeth and jaw are lined up correctly. How is this treated? TMJ syndrome often goes away on its own. If treatment is needed, the options may include:  Eating soft foods and applying  ice or heat.  Medicines to relieve pain or inflammation.  Medicines to relax the muscles.  A splint, bite plate, or mouthpiece to prevent teeth grinding or jaw clenching.  Relaxation techniques or counseling to help reduce stress.  Transcutaneous electrical nerve stimulation (TENS). This helps to relieve pain by applying an electrical current through the skin.  Acupuncture. This is sometimes helpful to relieve pain.  Jaw surgery. This is rarely needed.  Follow these instructions at home:  Take medicines only as directed by your health care provider.  Eat a soft diet if you are having trouble chewing.  Apply ice to the painful area. ? Put ice in a plastic bag. ? Place a towel between your skin and the bag. ? Leave the ice on for 20 minutes, 2-3 times a day.  Apply a warm compress to the painful area as directed.  Massage your jaw area and perform any jaw stretching exercises as recommended by your health care provider.  If you were given a mouthpiece or bite plate, wear it as directed.  Avoid foods that require a lot of chewing. Do not chew gum.  Keep all follow-up visits as directed by your health care provider. This is important. Contact a health care provider if:  You are having trouble eating.  You have new or worsening symptoms. Get help right away if:  Your jaw locks open or closed. This information  is not intended to replace advice given to you by your health care provider. Make sure you discuss any questions you have with your health care provider. Document Released: 02/15/2001 Document Revised: 01/21/2016 Document Reviewed: 12/26/2013 Elsevier Interactive Patient Education  [employer].

## 2017-09-25 ENCOUNTER — Telehealth (HOSPITAL_COMMUNITY): Payer: Self-pay | Admitting: *Deleted

## 2017-09-25 NOTE — Telephone Encounter (Signed)
Left message on voicemail per DPR in reference to upcoming appointment scheduled on 09/28/17 with detailed instructions given per Myocardial Perfusion Study Information Sheet for the test. LM to arrive 15 minutes early, and that it is imperative to arrive on time for appointment to keep from having the test rescheduled. If you need to cancel or reschedule your appointment, please call the office within 24 hours of your appointment. Failure to do so may result in a cancellation of your appointment, and a $50 no show fee. Phone number given for call back for any questions. Maxi Rodas Jacqueline    

## 2017-09-28 ENCOUNTER — Other Ambulatory Visit: Payer: Self-pay

## 2017-09-28 ENCOUNTER — Ambulatory Visit (HOSPITAL_BASED_OUTPATIENT_CLINIC_OR_DEPARTMENT_OTHER): Payer: BLUE CROSS/BLUE SHIELD

## 2017-09-28 ENCOUNTER — Ambulatory Visit (HOSPITAL_COMMUNITY): Payer: BLUE CROSS/BLUE SHIELD | Attending: Internal Medicine

## 2017-09-28 DIAGNOSIS — R079 Chest pain, unspecified: Secondary | ICD-10-CM

## 2017-09-28 LAB — MYOCARDIAL PERFUSION IMAGING
Estimated workload: 9.7 METS
Exercise duration (min): 7 min
Exercise duration (sec): 45 s
LV dias vol: 75 mL (ref 46–106)
LV sys vol: 27 mL
MPHR: 176 {beats}/min
Peak HR: 160 {beats}/min
Percent HR: 90 %
RATE: 0.24
RPE: 18
Rest HR: 82 {beats}/min
SDS: 2
SRS: 8
SSS: 10
TID: 0.85

## 2017-09-28 LAB — ECHOCARDIOGRAM COMPLETE
Height: 64 in
Weight: 2128 oz

## 2017-09-28 MED ORDER — TECHNETIUM TC 99M TETROFOSMIN IV KIT
32.4000 | PACK | Freq: Once | INTRAVENOUS | Status: AC | PRN
  Administered 2017-09-28: 32.4 via INTRAVENOUS
  Filled 2017-09-28: qty 33

## 2017-09-28 MED ORDER — TECHNETIUM TC 99M TETROFOSMIN IV KIT
10.6000 | PACK | Freq: Once | INTRAVENOUS | Status: AC | PRN
  Administered 2017-09-28: 10.6 via INTRAVENOUS
  Filled 2017-09-28: qty 11

## 2017-09-28 NOTE — Progress Notes (Unsigned)
09/28/17 Pregnancy test administered prior to Nuclear stress test-- test negative.  [name], Kelly Carr Jacqueline

## 2017-12-11 IMAGING — MR MR SHOULDER*R* WO/W CM
4 of 6 series · 19 of 40 positions shown · IV contrast (Yes)
Comparison: None.

CLINICAL DATA: Limited range of motion, stiffness. Status post
fall.

EXAM:
MR ARTHROGRAM OF THE RIGHT SHOULDER
TECHNIQUE: Multiplanar, multisequence MR imaging of the right shoulder was
performed following the administration of intra-articular contrast.
CONTRAST:  See Injection Documentation.

[Series 2: T1 fat-sat · axial · 4.0mm · 0.23mm/px · z∈[-19,+46]mm · 3 of 19 slices shown (1 of 2)]
[im 3/19]
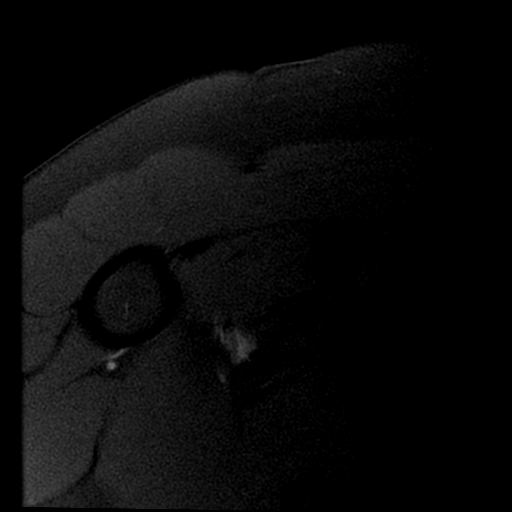
[im 11/19]
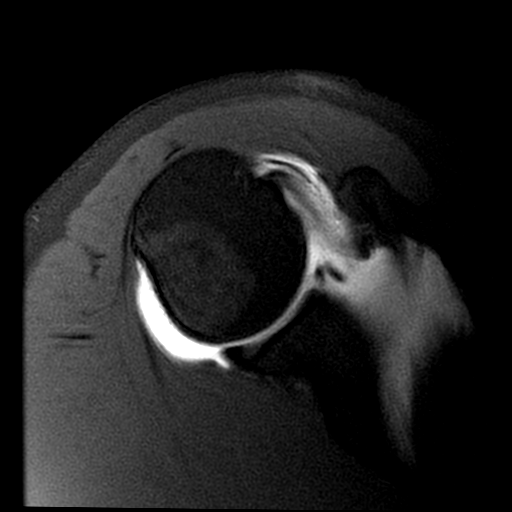
[im 16/19]
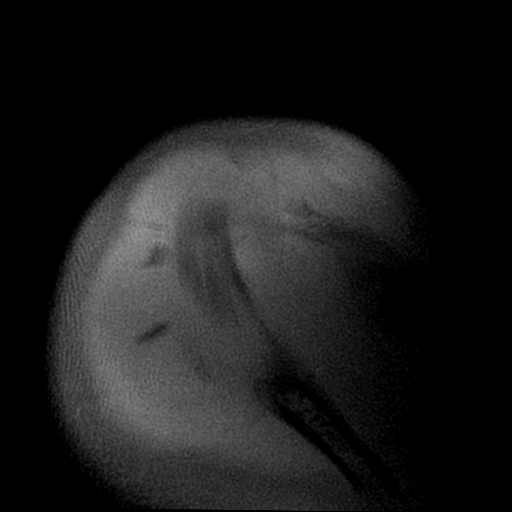

[Series 3: T1 fat-sat · oblique · 4.0mm · 0.29mm/px · 3 of 18 slices shown (2 of 2)]
[im 3/18]
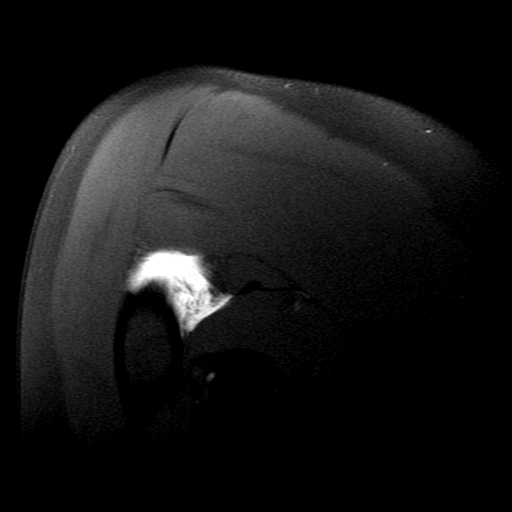
[im 9/18]
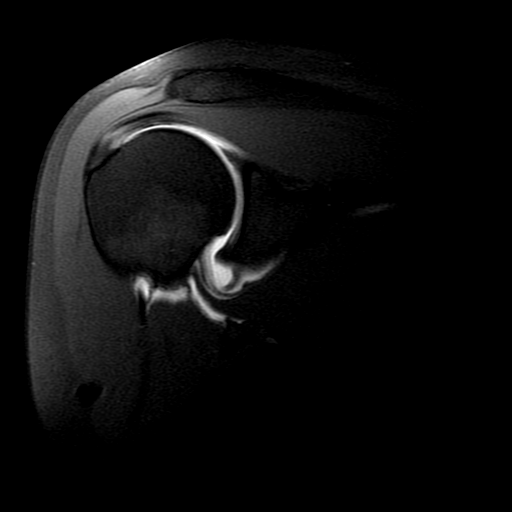
[im 15/18]
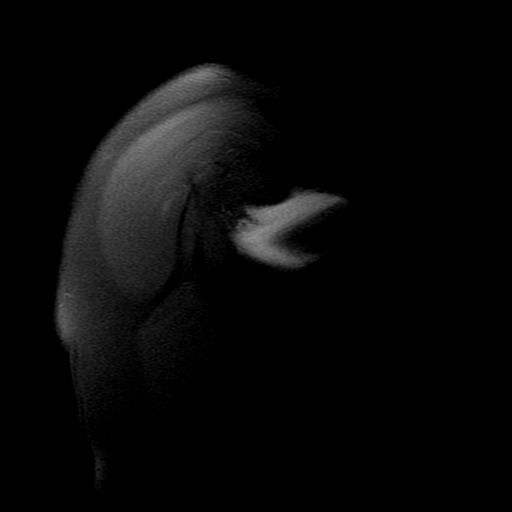

[Series 5: T2 fat-sat · oblique · 4.0mm · 0.29mm/px · 6 of 18 slices shown (1 of 2)]
[im 1/18]
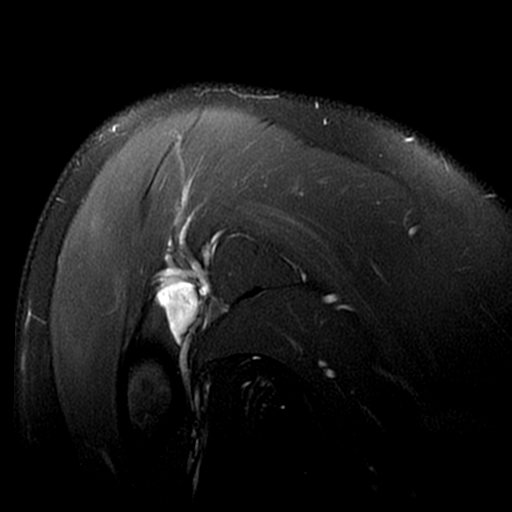
[im 4/18]
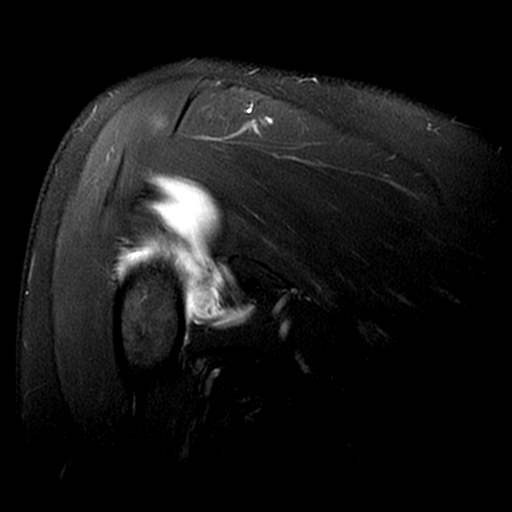
[im 7/18]
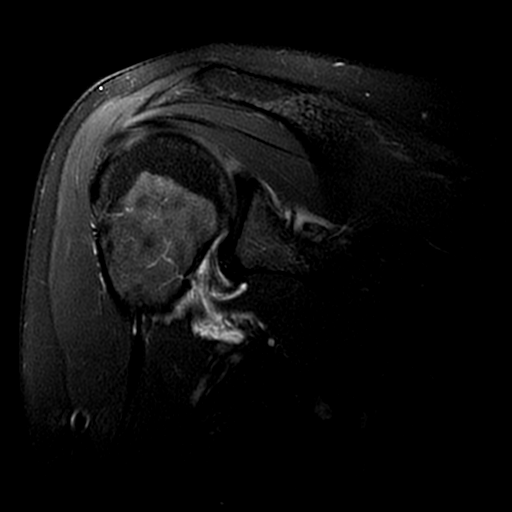
[im 11/18]
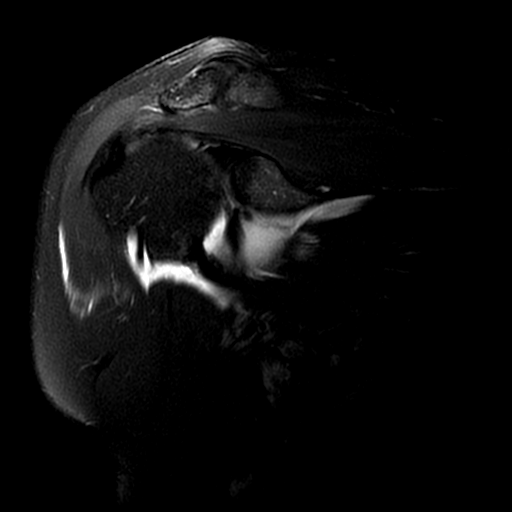
[im 14/18]
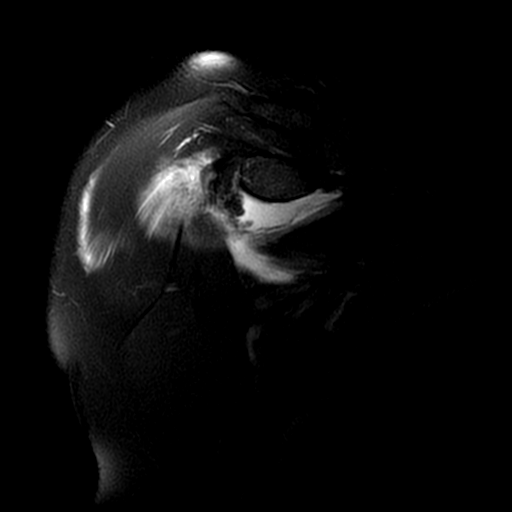
[im 18/18]
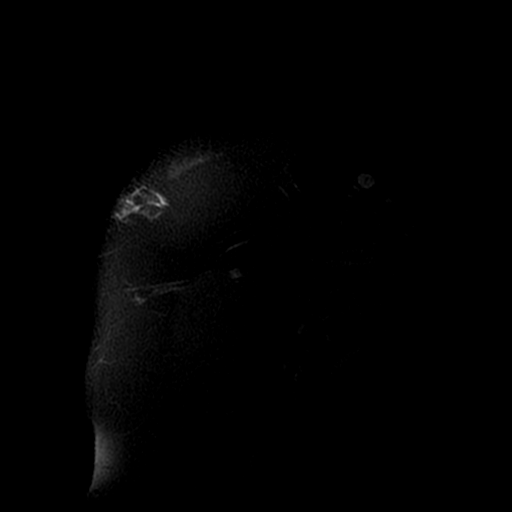

[Series 6: T2 fat-sat · oblique · 4.0mm · 0.29mm/px · 7 of 20 slices shown (2 of 2)]
[im 1/20]
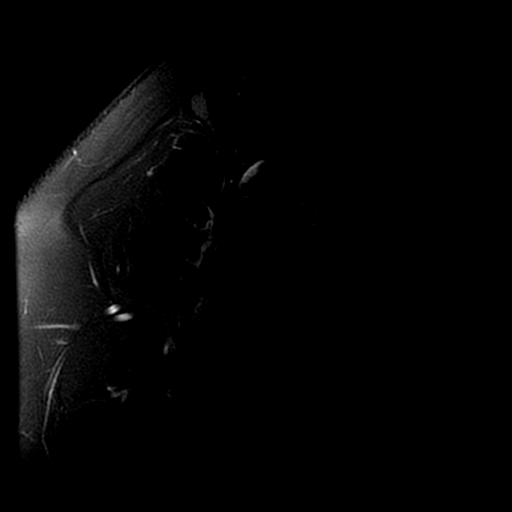
[im 4/20]
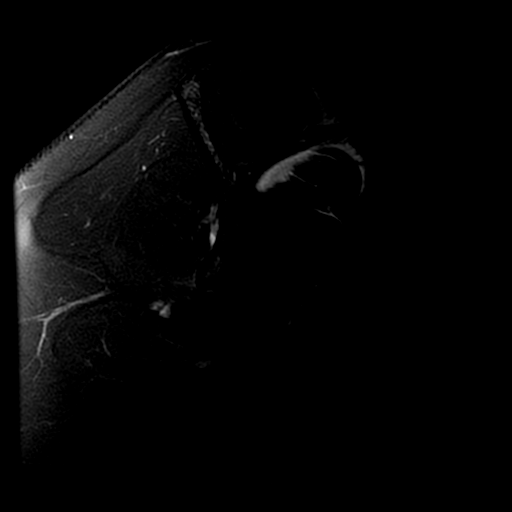
[im 7/20]
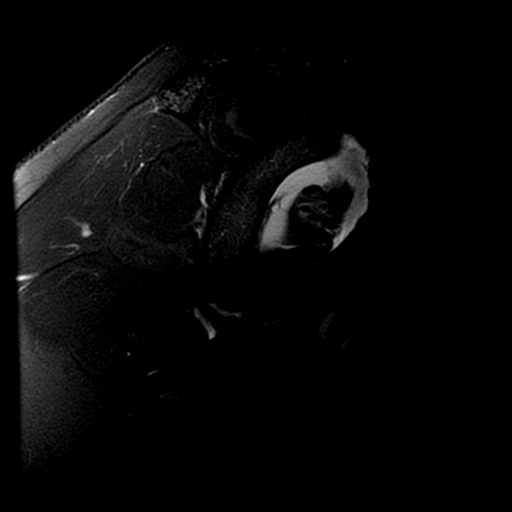
[im 10/20]
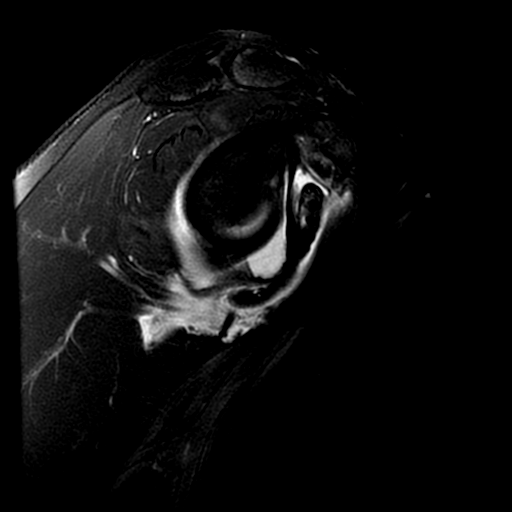
[im 13/20]
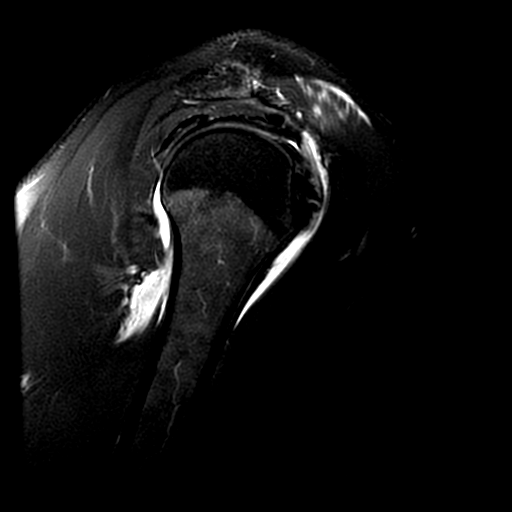
[im 16/20]
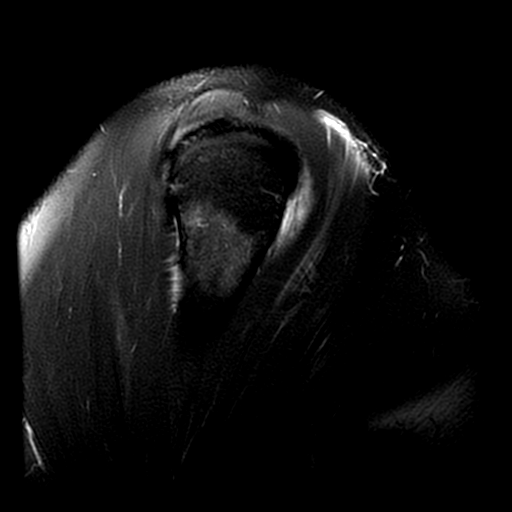
[im 20/20]
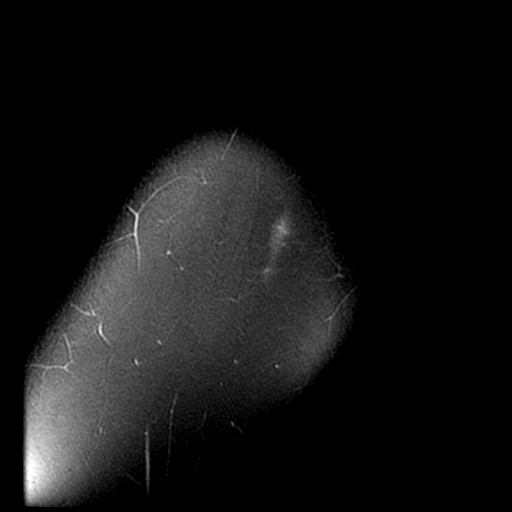

[19 of 40 positions shown; findings below may reference images not displayed]

FINDINGS: Rotator cuff: Mild tendinosis of the supraspinatus and infraspinatus
tendons without a tear. Teres minor tendon is intact. Subscapularis
tendon is intact.

Muscles: No atrophy of the muscles of the rotator cuff. Mild muscle
edema in the teres minor and supraspinatus muscles which may be
neurogenic versus secondary to mild muscle strain. Mild muscle edema
in the deltoid muscle.

Biceps long head: Intact.

Acromioclavicular Joint: Mild degenerative changes of the
acromioclavicular joint. Type I acromion. No subacromial/ subdeltoid
bursal fluid or contrast.

Glenohumeral Joint: Intraarticular contrast distending the joint
capsule. Portion of the contrast has decompressed through the
axillary recess, likely iatrogenic. Normal glenohumeral ligaments.
No chondral defect.

Labrum: Intact.

Bones: No marrow signal abnormality.  No fracture or dislocation.
IMPRESSION: 1. Mild tendinosis of the supraspinatus and infraspinatus tendons
without a tear.
2. Mild muscle edema in the teres minor and supraspinatus muscles
which may be neurogenic versus secondary to mild muscle strain.
3. Mild muscle edema in the deltoid muscle likely reflecting mild
muscle strain.

## 2018-03-05 ENCOUNTER — Encounter: Payer: Self-pay | Admitting: Radiology

## 2018-03-08 ENCOUNTER — Ambulatory Visit (INDEPENDENT_AMBULATORY_CARE_PROVIDER_SITE_OTHER): Payer: BLUE CROSS/BLUE SHIELD | Admitting: Psychiatry

## 2018-03-08 DIAGNOSIS — F319 Bipolar disorder, unspecified: Secondary | ICD-10-CM

## 2018-03-08 NOTE — Progress Notes (Signed)
Patient presents today       [facility] Counselor/Therapist Progress Note   Patient ID: Kelly Carr, MRN: [id number]  Date: 03/08/2018  Timespent: 60 minutes  Treatment Type: Individual  Subjective: Patient presents today more alert, and overall seems less and less depressed.  She reports fewer nightmares.  Demonstrates some rumination in her talking but less than on prior occasions.  Also showing increased self-esteem, insight, and self-confidence.  Goal review with patient and progress noted.  Discussed triggers for mood instability cited 3 main areas to her episodes of instability being off her medication as prescribed, reduced sleep, and unhealthy or little eating.  Patient also noted that certain smells, certain environments such as hospitals, and certain people such as police are all triggers for her as well due to past history.  We discussed with her knowing these triggers, how she might interrupt cycles beginning in the future, and avoid hospitalization, which patient really seemed to appreciate.  Patient is also working on trying to trust people more.  Action plan for patient includes: improved self-care (especially staying on meds as prescribed, eating healthy, getting enough sleep regularly), journaling, planning ahead (she suggested using a planner), having some family contact as long as family is respectful of her and with patient setting boundaries as needed, and getting more physical activity.  Interventions:Solution Focused, Strength-based, Assertiveness Training, Supportive and Reframing  Mental Status Exam:   Appearance:   Neat     Behavior:   Appropriate and Sharing  Motor:  Normal  Speech/Language:   Normal Rate  Affect:  Congruent  Mood:  anxious and depressed  Thought process:  Coherent  Thought content:    Logical  Perceptual disturbances:    Normal  Orientation:  Full (Time, Place, and Person)  Attention:  Good  Concentration:  good  Memory:  Immediate  Fund  of knowledge:   Good  Insight:    Good  Judgment:   Good  Impulse Control:  good    Reported Symptoms: Depressed and Anxious (however these have decreased some), frustrated, concerns re: family, stressed, doing some better at work and just began a new part-time job to help financially.  Has also developed worsened arthritic symptoms in her left wrist.  Has appt to have that checked out 03/20/2018.  Risk Assessment: Danger to Self:  No Self-injurious Behavior: No Danger to Others: No Duty to Warn:no Physical Aggression / Violence:No  Access to Firearms a concern: No  Gang Involvement:No   Diagnosis:   ICD-10-CM   1. Bipolar disorder with depression (HCC) F31.9      Plan: Will see patient in 1-2 weeks for continued goal-directed treatment.    Mathis Fare, LCSW

## 2018-03-08 NOTE — Patient Instructions (Signed)
Focus heavily on self-care (taking meds as prescribed, getting enough sleep, positive thoughts, less over-thinking, physical activity, eating healthier).  Encouraged to also resume writing which has helped in past.

## 2018-03-16 ENCOUNTER — Encounter: Payer: Self-pay | Admitting: Psychiatry

## 2018-03-16 ENCOUNTER — Ambulatory Visit (INDEPENDENT_AMBULATORY_CARE_PROVIDER_SITE_OTHER): Payer: BLUE CROSS/BLUE SHIELD | Admitting: Psychiatry

## 2018-03-16 DIAGNOSIS — F319 Bipolar disorder, unspecified: Secondary | ICD-10-CM | POA: Diagnosis not present

## 2018-03-16 NOTE — Progress Notes (Signed)
      [facility] Counselor/Therapist Progress Note   Patient ID: Kelly Carr, MRN: [id number]  Date: 03/16/2018  Timespent: 60 minutes  Treatment Type: Individual   Subjective: Patient in today and bursts into tears as soon as I go to get her from the lobby for her appt.  Continues crying as she comes into my office.  Calms down some within a few minutes to where she is able to speak.  Shares multiple stressors that are affecting her today and quickly denies that she is having another bipolar episode.  Speech initially pressured and loud, tangentiality, non-stop talking, agitated, restless, and without being asks, she intermittently states that she is not having another episode.  Does eventually slow down her talking, became less agitated, less restless, speech not as pressured.  Angry that she called former boyfriend with whom she was in very dysfunctional relationship.   Patient insisted on reading through multiple email or text messages. Minutes later I suggested we focus in on the feelings she is having right now and help her feel more grounded and calm.  She insisted she needed to keep on reading through the emails.  Still insists that she is taking meds as prescribed just as she has been.  While she has calmed down considerably, I asked her if she could be off from work this afternoon to get some much needed rest.  Patient said that she is going to take this afternoon off and rest.   Also promised to be evaluated at hospital if she should feel worse over the weekend.  Promised to remain on meds as prescribed and be back in touch if needed.  Has appt scheduled to follow up next week.     Interventions:Solution Focused, Strength-based, Supportive and Reframing  Mental Status Exam:   Appearance:   Casual     Behavior:  Sharing and Agitated  Motor:  Restlestness  Speech/Language:   Pressured  Affect:  Depressed, Flat and Tearful  Mood:  angry, anxious, depressed and sad  Thought process:   Tangential  Thought content:    Tangential  Perceptual disturbances:    Normal  Orientation:  Full (Time, Place, and Person)  Attention:  Good  Concentration:  down slightly  Memory:  Immediate  Fund of knowledge:   Good  Insight:    Fair  Judgment:   Fair  Impulse Control:  fair    Reported Symptoms: Stressed, anxious, depressed, sad, anger, resentful, hurt  Risk Assessment: Danger to Self:  No Self-injurious Behavior: No Danger to Others: No Duty to Warn:no Physical Aggression / Violence:No  Access to Firearms a concern: No  Gang Involvement:No   Diagnosis:   ICD-10-CM   1. Bipolar disorder with depression (HCC) F31.9      Plan: Plan to see patient again in 1-2 weeks.  Is some more stable by end of session.  Denies any thoughts to harm self or others. Feels supported by friends at her work and a church she attends.  Mathis Fare, LCSW

## 2018-03-19 ENCOUNTER — Other Ambulatory Visit: Payer: Self-pay

## 2018-03-19 ENCOUNTER — Encounter (HOSPITAL_COMMUNITY): Payer: Self-pay | Admitting: Emergency Medicine

## 2018-03-19 ENCOUNTER — Emergency Department (HOSPITAL_COMMUNITY)
Admission: EM | Admit: 2018-03-19 | Discharge: 2018-03-19 | Disposition: A | Discharge status: Home / Self Care | Payer: BLUE CROSS/BLUE SHIELD | Attending: Emergency Medicine | Admitting: Emergency Medicine

## 2018-03-19 DIAGNOSIS — Z5321 Procedure and treatment not carried out due to patient leaving prior to being seen by health care provider: Secondary | ICD-10-CM | POA: Insufficient documentation

## 2018-03-19 DIAGNOSIS — F29 Unspecified psychosis not due to a substance or known physiological condition: Secondary | ICD-10-CM | POA: Diagnosis present

## 2018-03-19 DIAGNOSIS — F3010 Manic episode without psychotic symptoms, unspecified: Secondary | ICD-10-CM

## 2018-03-19 HISTORY — DX: Unspecified osteoarthritis, unspecified site: M19.90

## 2018-03-19 NOTE — ED Triage Notes (Signed)
Pt from home stating her family reports she needed to be evaluated for her bipolar disorder. Pt states today is her birthday and her family has "all turned on her". Pt states her mother is undergoing medial issues and has been a major stressor recently. Pt stated when her family told her to get evaluated she went to her therapist who told her she looks like she is "doing great". Pt denies SI, HI, AVH. When asked what her family is reporting seeing, pt stood up and spun in a circle meaning her entire body. Pt reports she has been experiencing mania and cycles of her mood. In the same sentence she reports she is on a "good regimen of medications" that are working well for her. Pt mentioned her birthday 5 times during assessment.

## 2018-03-19 NOTE — ED Notes (Signed)
Pt demanded to have her clothes and be able to leave. Pt stated "it's my birthday and I am pissed off I am in the hospital". Pt still denies SI, HI, AVH. Pt given her belongings and left AMA. Pt told she is leaving without medical clearance by EDP. Pt left unit. Providers notified.

## 2018-03-20 ENCOUNTER — Inpatient Hospital Stay (HOSPITAL_COMMUNITY)
Admission: AD | Admit: 2018-03-20 | Discharge: 2018-03-26 | DRG: 885 | Disposition: A | Discharge status: Home / Self Care | Payer: BLUE CROSS/BLUE SHIELD | Source: Intra-hospital | Attending: Psychiatry | Admitting: Psychiatry

## 2018-03-20 ENCOUNTER — Encounter (HOSPITAL_COMMUNITY): Payer: Self-pay | Admitting: *Deleted

## 2018-03-20 ENCOUNTER — Other Ambulatory Visit: Payer: Self-pay

## 2018-03-20 ENCOUNTER — Ambulatory Visit: Payer: BLUE CROSS/BLUE SHIELD | Admitting: Family Medicine

## 2018-03-20 ENCOUNTER — Emergency Department (HOSPITAL_COMMUNITY)
Admission: EM | Admit: 2018-03-20 | Discharge: 2018-03-20 | Disposition: A | Discharge status: Other Acute Inpatient Hospital | Payer: BLUE CROSS/BLUE SHIELD | Source: Home / Self Care | Attending: Emergency Medicine | Admitting: Emergency Medicine

## 2018-03-20 DIAGNOSIS — F312 Bipolar disorder, current episode manic severe with psychotic features: Secondary | ICD-10-CM | POA: Diagnosis present

## 2018-03-20 DIAGNOSIS — F332 Major depressive disorder, recurrent severe without psychotic features: Secondary | ICD-10-CM

## 2018-03-20 DIAGNOSIS — Z79899 Other long term (current) drug therapy: Secondary | ICD-10-CM | POA: Insufficient documentation

## 2018-03-20 DIAGNOSIS — Z23 Encounter for immunization: Secondary | ICD-10-CM | POA: Diagnosis not present

## 2018-03-20 DIAGNOSIS — G4700 Insomnia, unspecified: Secondary | ICD-10-CM | POA: Diagnosis present

## 2018-03-20 DIAGNOSIS — F3010 Manic episode without psychotic symptoms, unspecified: Secondary | ICD-10-CM

## 2018-03-20 DIAGNOSIS — Z915 Personal history of self-harm: Secondary | ICD-10-CM | POA: Diagnosis not present

## 2018-03-20 DIAGNOSIS — K219 Gastro-esophageal reflux disease without esophagitis: Secondary | ICD-10-CM | POA: Diagnosis present

## 2018-03-20 DIAGNOSIS — Z87891 Personal history of nicotine dependence: Secondary | ICD-10-CM | POA: Insufficient documentation

## 2018-03-20 DIAGNOSIS — F17210 Nicotine dependence, cigarettes, uncomplicated: Secondary | ICD-10-CM | POA: Diagnosis present

## 2018-03-20 DIAGNOSIS — Z62810 Personal history of physical and sexual abuse in childhood: Secondary | ICD-10-CM | POA: Diagnosis present

## 2018-03-20 DIAGNOSIS — E785 Hyperlipidemia, unspecified: Secondary | ICD-10-CM | POA: Diagnosis present

## 2018-03-20 DIAGNOSIS — F3113 Bipolar disorder, current episode manic without psychotic features, severe: Secondary | ICD-10-CM | POA: Insufficient documentation

## 2018-03-20 DIAGNOSIS — F4310 Post-traumatic stress disorder, unspecified: Secondary | ICD-10-CM

## 2018-03-20 DIAGNOSIS — F419 Anxiety disorder, unspecified: Secondary | ICD-10-CM | POA: Diagnosis not present

## 2018-03-20 DIAGNOSIS — F3163 Bipolar disorder, current episode mixed, severe, without psychotic features: Secondary | ICD-10-CM | POA: Diagnosis not present

## 2018-03-20 LAB — CBC WITH DIFFERENTIAL/PLATELET
Abs Immature Granulocytes: 0.12 10*3/uL — ABNORMAL HIGH (ref 0.00–0.07)
Basophils Absolute: 0.1 10*3/uL (ref 0.0–0.1)
Basophils Relative: 0 %
Eosinophils Absolute: 0.1 10*3/uL (ref 0.0–0.5)
Eosinophils Relative: 1 %
HCT: 36.3 % (ref 36.0–46.0)
Hemoglobin: 11.7 g/dL — ABNORMAL LOW (ref 12.0–15.0)
Immature Granulocytes: 1 %
Lymphocytes Relative: 17 %
Lymphs Abs: 2.7 10*3/uL (ref 0.7–4.0)
MCH: 28.1 pg (ref 26.0–34.0)
MCHC: 32.2 g/dL (ref 30.0–36.0)
MCV: 87.3 fL (ref 80.0–100.0)
Monocytes Absolute: 1.5 10*3/uL — ABNORMAL HIGH (ref 0.1–1.0)
Monocytes Relative: 10 %
Neutro Abs: 11.1 10*3/uL — ABNORMAL HIGH (ref 1.7–7.7)
Neutrophils Relative %: 71 %
Platelets: 372 10*3/uL (ref 150–400)
RBC: 4.16 MIL/uL (ref 3.87–5.11)
RDW: 13.4 % (ref 11.5–15.5)
WBC: 15.5 10*3/uL — ABNORMAL HIGH (ref 4.0–10.5)
nRBC: 0 % (ref 0.0–0.2)

## 2018-03-20 LAB — COMPREHENSIVE METABOLIC PANEL
ALT: 34 U/L (ref 0–44)
AST: 34 U/L (ref 15–41)
Albumin: 4.1 g/dL (ref 3.5–5.0)
Alkaline Phosphatase: 63 U/L (ref 38–126)
Anion gap: 11 (ref 5–15)
BUN: 9 mg/dL (ref 6–20)
CO2: 24 mmol/L (ref 22–32)
Calcium: 9.3 mg/dL (ref 8.9–10.3)
Chloride: 106 mmol/L (ref 98–111)
Creatinine, Ser: 0.67 mg/dL (ref 0.44–1.00)
GFR calc Af Amer: >60 mL/min (ref >60)
GFR calc non Af Amer: >60 mL/min (ref >60)
Glucose, Bld: 110 mg/dL — ABNORMAL HIGH (ref 70–99)
Potassium: 3.6 mmol/L (ref 3.5–5.1)
Sodium: 141 mmol/L (ref 135–145)
Total Bilirubin: 0.7 mg/dL (ref 0.3–1.2)
Total Protein: 6.9 g/dL (ref 6.5–8.1)

## 2018-03-20 LAB — ETHANOL: Alcohol, Ethyl (B): <10 mg/dL (ref <10)

## 2018-03-20 LAB — I-STAT BETA HCG BLOOD, ED (MC, WL, AP ONLY): I-stat hCG, quantitative: <5 m[IU]/mL (ref <5)

## 2018-03-20 LAB — RAPID URINE DRUG SCREEN, HOSP PERFORMED
Amphetamines: NOT DETECTED
Barbiturates: NOT DETECTED
Benzodiazepines: NOT DETECTED
Cocaine: NOT DETECTED
Opiates: NOT DETECTED
Tetrahydrocannabinol: NOT DETECTED

## 2018-03-20 MED ORDER — TRAZODONE HCL 100 MG PO TABS: 100.0000 mg | ORAL_TABLET | Freq: Every evening | ORAL | Status: DC | PRN

## 2018-03-20 MED ORDER — NICOTINE POLACRILEX 2 MG MT GUM
2.0000 mg | CHEWING_GUM | OROMUCOSAL | Status: DC | PRN
  Administered 2018-03-20 – 2018-03-26 (×22): 2 mg via ORAL
  Filled 2018-03-20 (×4): qty 1

## 2018-03-20 MED ORDER — BUPROPION HCL ER (XL) 300 MG PO TB24
300.0000 mg | ORAL_TABLET | Freq: Every day | ORAL | Status: DC
  Administered 2018-03-21: 300 mg via ORAL
  Filled 2018-03-20 (×3): qty 1

## 2018-03-20 MED ORDER — LORAZEPAM 2 MG/ML IJ SOLN
INTRAMUSCULAR | Status: AC
  Administered 2018-03-20: 2 mg via INTRAMUSCULAR
  Filled 2018-03-20: qty 1

## 2018-03-20 MED ORDER — DIPHENHYDRAMINE HCL 50 MG/ML IJ SOLN
50.0000 mg | Freq: Once | INTRAMUSCULAR | Status: AC
  Administered 2018-03-20: 50 mg via INTRAMUSCULAR
  Filled 2018-03-20: qty 1

## 2018-03-20 MED ORDER — OXCARBAZEPINE 300 MG PO TABS
300.0000 mg | ORAL_TABLET | Freq: Two times a day (BID) | ORAL | Status: DC
  Administered 2018-03-20: 300 mg via ORAL
  Filled 2018-03-20: qty 1

## 2018-03-20 MED ORDER — LORAZEPAM 2 MG/ML IJ SOLN
2.0000 mg | Freq: Once | INTRAMUSCULAR | Status: AC
  Administered 2018-03-20: 2 mg via INTRAMUSCULAR

## 2018-03-20 MED ORDER — LORAZEPAM 2 MG/ML IJ SOLN
2.0000 mg | Freq: Once | INTRAMUSCULAR | Status: AC
  Administered 2018-03-20: 2 mg via INTRAMUSCULAR
  Filled 2018-03-20: qty 1

## 2018-03-20 MED ORDER — NORGESTIMATE-ETH ESTRADIOL 0.25-35 MG-MCG PO TABS: 1.0000 | ORAL_TABLET | Freq: Every day | ORAL | Status: DC

## 2018-03-20 MED ORDER — ZIPRASIDONE MESYLATE 20 MG IM SOLR: 20.0000 mg | Freq: Two times a day (BID) | INTRAMUSCULAR | Status: DC | PRN

## 2018-03-20 MED ORDER — DIPHENHYDRAMINE HCL 50 MG/ML IJ SOLN
50.0000 mg | Freq: Once | INTRAMUSCULAR | Status: AC
  Administered 2018-03-20: 50 mg via INTRAMUSCULAR

## 2018-03-20 MED ORDER — PNEUMOCOCCAL VAC POLYVALENT 25 MCG/0.5ML IJ INJ
0.5000 mL | INJECTION | INTRAMUSCULAR | Status: AC
  Administered 2018-03-23: 0.5 mL via INTRAMUSCULAR

## 2018-03-20 MED ORDER — HYDROXYZINE PAMOATE 50 MG PO CAPS
50.0000 mg | ORAL_CAPSULE | Freq: Three times a day (TID) | ORAL | Status: DC | PRN
  Filled 2018-03-20: qty 1

## 2018-03-20 MED ORDER — ALUM & MAG HYDROXIDE-SIMETH 200-200-20 MG/5ML PO SUSP: 30.0000 mL | ORAL | Status: DC | PRN

## 2018-03-20 MED ORDER — ACETAMINOPHEN 325 MG PO TABS
650.0000 mg | ORAL_TABLET | Freq: Four times a day (QID) | ORAL | Status: DC | PRN
  Administered 2018-03-21 – 2018-03-24 (×7): 650 mg via ORAL
  Filled 2018-03-20 (×7): qty 2

## 2018-03-20 MED ORDER — HYDROXYZINE HCL 50 MG PO TABS: 50.0000 mg | ORAL_TABLET | Freq: Three times a day (TID) | ORAL | Status: DC | PRN

## 2018-03-20 MED ORDER — LORAZEPAM 1 MG PO TABS: 1.0000 mg | ORAL_TABLET | ORAL | Status: DC | PRN

## 2018-03-20 MED ORDER — ZIPRASIDONE MESYLATE 20 MG IM SOLR
INTRAMUSCULAR | Status: AC
  Filled 2018-03-20: qty 20

## 2018-03-20 MED ORDER — BUPROPION HCL ER (XL) 150 MG PO TB24
300.0000 mg | ORAL_TABLET | Freq: Every day | ORAL | Status: DC
  Administered 2018-03-20: 300 mg via ORAL
  Filled 2018-03-20: qty 2

## 2018-03-20 MED ORDER — ZIPRASIDONE MESYLATE 20 MG IM SOLR
20.0000 mg | Freq: Once | INTRAMUSCULAR | Status: AC
  Administered 2018-03-20: 20 mg via INTRAMUSCULAR
  Filled 2018-03-20: qty 20

## 2018-03-20 MED ORDER — HYDROXYZINE HCL 25 MG PO TABS: 50.0000 mg | ORAL_TABLET | Freq: Three times a day (TID) | ORAL | Status: DC | PRN

## 2018-03-20 MED ORDER — MAGNESIUM HYDROXIDE 400 MG/5ML PO SUSP
30.0000 mL | Freq: Every day | ORAL | Status: DC | PRN
  Administered 2018-03-23 – 2018-03-25 (×2): 30 mL via ORAL
  Filled 2018-03-20 (×2): qty 30

## 2018-03-20 MED ORDER — HYDROXYZINE HCL 50 MG PO TABS
50.0000 mg | ORAL_TABLET | Freq: Three times a day (TID) | ORAL | Status: DC | PRN
  Administered 2018-03-20 – 2018-03-21 (×2): 50 mg via ORAL
  Filled 2018-03-20 (×2): qty 1

## 2018-03-20 MED ORDER — INFLUENZA VAC SPLIT QUAD 0.5 ML IM SUSY
0.5000 mL | PREFILLED_SYRINGE | INTRAMUSCULAR | Status: AC
  Administered 2018-03-23: 0.5 mL via INTRAMUSCULAR
  Filled 2018-03-20: qty 0.5

## 2018-03-20 MED ORDER — ZIPRASIDONE MESYLATE 20 MG IM SOLR
10.0000 mg | Freq: Once | INTRAMUSCULAR | Status: AC
  Administered 2018-03-20: 10 mg via INTRAMUSCULAR

## 2018-03-20 MED ORDER — OXCARBAZEPINE 300 MG PO TABS
300.0000 mg | ORAL_TABLET | Freq: Two times a day (BID) | ORAL | Status: DC
  Administered 2018-03-21: 300 mg via ORAL
  Filled 2018-03-20 (×4): qty 1

## 2018-03-20 MED ORDER — DIPHENHYDRAMINE HCL 50 MG/ML IJ SOLN
INTRAMUSCULAR | Status: AC
  Administered 2018-03-20: 50 mg via INTRAMUSCULAR
  Filled 2018-03-20: qty 1

## 2018-03-20 MED ORDER — TRAZODONE HCL 50 MG PO TABS
50.0000 mg | ORAL_TABLET | Freq: Every evening | ORAL | Status: DC | PRN
  Administered 2018-03-20 – 2018-03-25 (×6): 50 mg via ORAL
  Filled 2018-03-20 (×7): qty 1

## 2018-03-20 NOTE — ED Notes (Signed)
Patient now up at nurse's station saying the staff doesn't care about her.  She appears to be upset about being IVC'd, and says she was lied to by the police.  Patient continues to blame her family and talk about a situation which occurred on her birthday in which she felt dishonored by her family.  Patient with rapid pressured speech and tearful. Waiting for police to pick her up and transfer her to [facility].

## 2018-03-20 NOTE — ED Notes (Signed)
Patient awake now.  Asked for her lunch tray and was polite to staff.  Calm and cooperative at this time.

## 2018-03-20 NOTE — BH Assessment (Signed)
[facility] Assessment Progress Note     Per Dr. Norman and Takia Starkes, NP, patient meets inpatient admission criteria.  

## 2018-03-20 NOTE — Progress Notes (Signed)
Adult Psychoeducational Group Note  Date:  03/20/2018 Time:  8:44 PM  Group Topic/Focus:  Wrap-Up Group:   The focus of this group is to help patients review their daily goal of treatment and discuss progress on daily workbooks.  Participation Level:  Active  Participation Quality:  Appropriate  Affect:  Appropriate  Cognitive:  Appropriate  Insight: Appropriate  Engagement in Group:  Engaged  Modes of Intervention:  Discussion  Additional Comments:  Patient had just arrived on  the unit. Patient wasn't sure why she was here.   Thadius Smisek W Kimberlyn Quiocho 03/20/2018, 8:44 PM

## 2018-03-20 NOTE — ED Notes (Signed)
Unable to get patient calmed down.  Continues to be agitated, yelling at staff and police officers.  Disturbing other patients and trying to get them to act like her.  Dr. Sharma Covert notified.  Medications ordered IM.

## 2018-03-20 NOTE — Progress Notes (Signed)
D: Pt brought to unit by admission nurse, pt proceeded to explain her situation to Clinical research associate. Pt presents with blaming conversation, pt appears to have some insight into her Tx, but appears to be upset about the current situations that got her in here. Pt took a shower and was seen in the dayroom watching TV with her peers.   A: Pt was offered support and encouragement. Pt was given scheduled medications. Pt was encourage to attend groups. Q 15 minute checks were done for safety.   R:Pt receptive to treatment and safety maintained on unit.

## 2018-03-20 NOTE — ED Provider Notes (Signed)
[facility]-EMERGENCY DEPT Provider Note   CSN: [id number] Arrival date & time: 03/20/18  0018     History   Chief Complaint Chief Complaint  Patient presents with  . Manic Behavior   Level 5 caveat due to psychiatric complaint HPI Kelly Carr is a 47 y.o. female.  The history is provided by the patient. The history is limited by the condition of the patient.  Mental Health Problem  Presenting symptoms: aggressive behavior and agitation   Degree of incapacity (severity):  Severe Timing:  Constant Progression:  Worsening Chronicity:  New Patient with history of bipolar presents with possible manic episode.  Patient was in the emergency department earlier but left, then was placed under IVC and brought back to the ER.  She has been talking to herself, she has been acting aggressively, she has been walking close to heavy traffic.  Patient is unable to provide any details at this time.  Past Medical History:  Diagnosis Date  . Abnormal Pap smear    Unknown results>colpo>normal  . Anxiety   . Arthritis   . Bipolar 1 disorder [facility])     Patient Active Problem List   Diagnosis Date Noted  . Constipation 04/14/2017  . Tendinopathy of right gluteus medius 04/05/2017  . Productive cough 03/06/2017  . 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 Manic behavior (HCC)   . Sprain of left ring finger 02/15/2017  . Chest pain 11/23/2016  . Ingrown toenail without infection 09/03/2016  . Right shoulder pain 03/08/2016  . Piriformis syndrome of right side 02/23/2016  . Right shoulder injury 02/17/2016  . HLD (hyperlipidemia) 11/12/2015  . Dysuria 05/26/2015  . Normocytic anemia 05/26/2015  . Pain of upper abdomen 05/04/2015  . 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 Labral tear of hip joint 12/27/2014  . Pharyngitis, chronic 08/11/2014  . Irregular menses  07/17/2013  . GERD (gastroesophageal reflux disease) 11/10/2010  . Carpal tunnel syndrome of left wrist 11/10/2010  . Bipolar disorder with depression (HCC) 11/25/2008  . OTH ABNORMAL PAPANICOLAOU SMEAR CERVIX&CERV HPV 11/25/2008  . DEPRESSION 10/21/2008    History reviewed. No pertinent surgical history.   OB History    Gravida  4   Para  2   Term  2   Preterm      AB  2   Living  2     SAB  2   TAB      Ectopic      Multiple      Live Births  2            Home Medications    Prior to Admission medications   Medication Sig Start Date End Date Taking? Authorizing Provider  buPROPion (WELLBUTRIN XL) 300 MG 24 hr tablet Take 300 mg by mouth daily.     [provider]  Ginkgo Biloba (GNP GINGKO BILOBA EXTRACT PO) Take 1 tablet by mouth daily.    [provider]  hydrOXYzine (VISTARIL) 50 MG capsule Take 50 mg by mouth 3 (three) times daily as needed. 02/28/18   [provider]  naproxen (EC NAPROSYN) 500 MG EC tablet Take 1 tablet (500 mg total) by mouth 2 (two) times daily with a meal. Patient not taking: Reported on 03/19/2018 09/14/17   Palma Holter, MD  norgestimate-ethinyl estradiol (ORTHO-CYCLEN,SPRINTEC,PREVIFEM) 0.25-35 MG-MCG tablet Take 1 tablet by mouth daily. Patient not taking: Reported on 03/19/2018 08/21/17  Monrovia Bing, MD  Oxcarbazepine (TRILEPTAL) 300 MG tablet Take 300 mg by mouth 2 (two) times daily. 03/05/18   [provider]  VRAYLAR capsule Take 3 mg by mouth at bedtime. 02/27/18   [provider]    Family History Family History  Problem Relation Age of Onset  . Diabetes Mother   . Hypertension Mother   . Heart disease Mother 49  . Diabetes Maternal Grandmother   . Heart disease Maternal Grandmother   . Diabetes Maternal Grandfather   . Heart disease Maternal Grandfather   . Breast cancer Neg Hx     Social History Social History   Tobacco Use  . Smoking status: Former  Smoker    Types: Cigarettes    Last attempt to quit: 06/07/1999    Years since quitting: 18.7  . Smokeless tobacco: Never Used  Substance Use Topics  . Alcohol use: No  . Drug use: No     Allergies   Haldol [haloperidol lactate]; Risperidone and related; Tramadol; and Valproic acid   Review of Systems Review of Systems  Unable to perform ROS: Psychiatric disorder  Psychiatric/Behavioral: Positive for agitation.     Physical Exam Updated Vital Signs BP 131/82 (BP Location: Right Arm)   Pulse (!) 107   Temp 98.2 F (36.8 C) (Oral)   Resp 18   LMP 02/27/2018 (Approximate)   SpO2 99%   Physical Exam CONSTITUTIONAL: Disheveled and agitated HEAD: Normocephalic/atraumatic EYES: EOMI ENMT: Mucous membranes moist NECK: supple no meningeal signs CV: S1/S2 noted, no murmurs/rubs/gallops noted LUNGS: Lungs are clear to auscultation bilaterally, no apparent distress ABDOMEN: soft NEURO: Pt is awake/alert/appropriate, moves all extremitiesx4.  No facial droop.  She walks around without difficulty EXTREMITIES: pulses normal/equal, full ROM SKIN: warm, color normal PSYCH: Anxious, agitated, yelling at police officers.   ED Treatments / Results  Labs (all labs ordered are listed, but only abnormal results are displayed) Labs Reviewed  COMPREHENSIVE METABOLIC PANEL - Abnormal; Notable for the following components:      Result Value   Glucose, Bld 110 (*)    All other components within normal limits  CBC WITH DIFFERENTIAL/PLATELET - Abnormal; Notable for the following components:   WBC 15.5 (*)    Hemoglobin 11.7 (*)    Neutro Abs 11.1 (*)    Monocytes Absolute 1.5 (*)    Abs Immature Granulocytes 0.12 (*)    All other components within normal limits  ETHANOL  RAPID URINE DRUG SCREEN, HOSP PERFORMED  I-STAT BETA HCG BLOOD, ED ([facility], [facility], [facility] ONLY)    EKG None  Radiology No results found.  Procedures Procedures   CRITICAL CARE Performed by: Joya Gaskins Total  critical care time: 31 minutes Critical care time was exclusive of separately billable procedures and treating other patients. Critical care was necessary to treat or prevent imminent or life-threatening deterioration. Critical care was time spent personally by me on the following activities: development of treatment plan with patient and/or surrogate as well as nursing, discussions with consultants, evaluation of patient's response to treatment, examination of patient, obtaining history from patient or surrogate, ordering and performing treatments and interventions, ordering and review of laboratory studies, ordering and review of radiographic studies, pulse oximetry and re-evaluation of patient's condition.   Medications Ordered in ED Medications  buPROPion (WELLBUTRIN XL) 24 hr tablet 300 mg (has no administration in time range)  hydrOXYzine (ATARAX/VISTARIL) tablet 50 mg (has no administration in time range)  Oxcarbazepine (TRILEPTAL) tablet 300 mg (0 mg Oral  Hold 03/20/18 0159)  ziprasidone (GEODON) injection 10 mg (10 mg Intramuscular Given 03/20/18 0116)  LORazepam (ATIVAN) injection 2 mg (2 mg Intramuscular Given 03/20/18 0117)  diphenhydrAMINE (BENADRYL) injection 50 mg (50 mg Intramuscular Given 03/20/18 0118)     Initial Impression / Assessment and Plan / ED Course  I have reviewed the triage vital signs and the nursing notes.  Pertinent labs  results that were available during my care of the patient were reviewed by me and considered in my medical decision making (see chart for details).     1:11 AM Patient with history of bipolar presents with probable manic episode.  She is acting erratically, yelling and cursing.  She is talking to herself. She would need to be admitted.  I have completed the IVC.  I have started her home medications at her request. 2:29 AM Patient medically stable, awaiting admission Final Clinical Impressions(s) / ED Diagnoses   Final diagnoses:    Manic behavior [facility])    ED Discharge Orders    None       Zadie Rhine, MD 03/20/18 [phone]

## 2018-03-20 NOTE — ED Notes (Signed)
Patient calm, cooperative.  Came up to nurse's station twice asking when she can leave and when can she make a phone call.  Patient concerned that her son knows she is out of jail and is coming home today.  Asked nurse to call her son, but this Clinical research associate said to wait until the doctor rounds and she knows more information.  Patient acts mildly confused.

## 2018-03-20 NOTE — Progress Notes (Signed)
Kelly Carr is a 47 year old female pt admitted on involuntary basis. On admission, she does present as hyperverbal with tangential thought process. She spoke about how she was trying to celebrate her birthday, was having some issues with [facility] where her mother resides and spoke about how she was not allowed to go back there. She then went on to talk about how her son spoke to the cops and had her involuntarily committed. She denies any SI currently and is able to contract for safety on the unit. She reports that she has a psychiatrist and therapist and reports that she takes all her medications as prescribed. She denies any substance abuse issues. She reports that she lives alone and reports that she will go back to the same living situation upon discharge. Jashae was escorted to the unit, oriented to the milieu and safety maintained.

## 2018-03-20 NOTE — ED Notes (Signed)
Patient getting loud in the hallway, grabbing drink from nurse's hand that was meant for another patient.  Acting bizarre dancing around the unit and disturbing the milieu.

## 2018-03-20 NOTE — BH Assessment (Signed)
[facility] Assessment Progress Note  Per Juanetta Beets, DO, this pt requires psychiatric hospitalization.  Malva Limes, RN, [facility] has pre-assigned pt to [facility] Rm 506-1 in anticipation of a discharge scheduled for later today; she will call when [facility] is ready to receive pt.  Pt presents under IVC initiated by law enforcement, and upheld by Zadie Rhine, MD, and IVC documents have been faxed to [facility].  Pt's nurse, Aram Beecham, has been notified, and agrees to call report to [phone].  Pt is to be transported via [employer].   Doylene Canning, [place] [facility] Coordinator [phone]

## 2018-03-20 NOTE — ED Notes (Signed)
Patient up at window relating some of the family dynamics that are going on in her life.  Putting blame on family members for various offenses and reports she is not having anything to do with them.  Also reports that her mother is in a nursing facility in which she is being treated badly and she would not wish that place on anyone.

## 2018-03-20 NOTE — ED Notes (Signed)
Report called to Lanora Manis RN at [facility].

## 2018-03-20 NOTE — BH Assessment (Signed)
[facility] Assessment Progress Note   Patient very manic, yelling, incoherent.  Unable to be assessed at this time.
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
## 2018-03-20 NOTE — ED Triage Notes (Signed)
Pt returned to ED under IVC with [employer].  Pt is manic, cursing, yelling it's my birthday, rhyming conversation.

## 2018-03-20 NOTE — ED Notes (Signed)
Pt agitated, banging on doors and glass at [employer], cursing at staff, Security and [employer].  Physically aggressive with Security.  PA Nira Conn called for orders, meds given with assistance of Security and [employer].

## 2018-03-20 NOTE — ED Notes (Signed)
Pt at nurses station exposing her private areas to staff

## 2018-03-20 NOTE — Tx Team (Signed)
Initial Treatment Plan 03/20/2018 8:12 PM Janelle Lewis ZOX:[id number]    PATIENT STRESSORS: Marital or family conflict Occupational concerns Other: mother had recent heart attack   PATIENT STRENGTHS: Ability for insight Average or above average intelligence Capable of independent living General fund of knowledge   PATIENT IDENTIFIED PROBLEMS: Anxiety Bipolar "Dealing with my mother who is sick" "I've been taking my medications every day"                     DISCHARGE CRITERIA:  Ability to meet basic life and health needs Improved stabilization in mood, thinking, and/or behavior Verbal commitment to aftercare and medication compliance  PRELIMINARY DISCHARGE PLAN: Attend aftercare/continuing care group Return to previous living arrangement  PATIENT/FAMILY INVOLVEMENT: This treatment plan has been presented to and reviewed with the patient, Aiyah Authement, and/or family member, .  The patient and family have been given the opportunity to ask questions and make suggestions.  Earlena Werst, [place], [place] 03/20/2018, 8:12 PM

## 2018-03-21 DIAGNOSIS — F3163 Bipolar disorder, current episode mixed, severe, without psychotic features: Principal | ICD-10-CM

## 2018-03-21 DIAGNOSIS — F4310 Post-traumatic stress disorder, unspecified: Secondary | ICD-10-CM

## 2018-03-21 LAB — TSH: TSH: 1.38 u[IU]/mL (ref 0.350–4.500)

## 2018-03-21 LAB — LIPID PANEL
Cholesterol: 195 mg/dL (ref 0–200)
HDL: 60 mg/dL (ref >40)
LDL Cholesterol: 118 mg/dL — ABNORMAL HIGH (ref 0–99)
Total CHOL/HDL Ratio: 3.3 RATIO
Triglycerides: 87 mg/dL (ref <150)
VLDL: 17 mg/dL (ref 0–40)

## 2018-03-21 LAB — HEMOGLOBIN A1C
Hgb A1c MFr Bld: 5 % (ref 4.8–5.6)
Mean Plasma Glucose: 96.8 mg/dL

## 2018-03-21 MED ORDER — OLANZAPINE 5 MG PO TBDP
5.0000 mg | ORAL_TABLET | Freq: Three times a day (TID) | ORAL | Status: DC | PRN
  Administered 2018-03-21: 5 mg via ORAL
  Filled 2018-03-21: qty 1

## 2018-03-21 MED ORDER — CARIPRAZINE HCL 3 MG PO CAPS
3.0000 mg | ORAL_CAPSULE | Freq: Every day | ORAL | Status: DC
  Administered 2018-03-21 – 2018-03-22 (×2): 3 mg via ORAL
  Filled 2018-03-21 (×3): qty 1

## 2018-03-21 MED ORDER — SERTRALINE HCL 50 MG PO TABS
50.0000 mg | ORAL_TABLET | Freq: Every day | ORAL | Status: DC
  Administered 2018-03-21 – 2018-03-26 (×6): 50 mg via ORAL
  Filled 2018-03-21 (×9): qty 1

## 2018-03-21 MED ORDER — IBUPROFEN 600 MG PO TABS
600.0000 mg | ORAL_TABLET | Freq: Four times a day (QID) | ORAL | Status: DC | PRN
  Administered 2018-03-21 – 2018-03-22 (×3): 600 mg via ORAL
  Filled 2018-03-21 (×3): qty 1

## 2018-03-21 MED ORDER — PRAZOSIN HCL 2 MG PO CAPS
2.0000 mg | ORAL_CAPSULE | Freq: Every day | ORAL | Status: DC
  Administered 2018-03-21 – 2018-03-25 (×5): 2 mg via ORAL
  Filled 2018-03-21 (×7): qty 1

## 2018-03-21 MED ORDER — BUPROPION HCL ER (XL) 150 MG PO TB24
150.0000 mg | ORAL_TABLET | Freq: Every day | ORAL | Status: DC
  Filled 2018-03-21: qty 1

## 2018-03-21 MED ORDER — GUAIFENESIN ER 600 MG PO TB12
600.0000 mg | ORAL_TABLET | Freq: Two times a day (BID) | ORAL | Status: DC | PRN
  Administered 2018-03-21 – 2018-03-22 (×3): 600 mg via ORAL
  Filled 2018-03-21 (×3): qty 1

## 2018-03-21 MED ORDER — CYCLOBENZAPRINE HCL 5 MG PO TABS
5.0000 mg | ORAL_TABLET | Freq: Three times a day (TID) | ORAL | Status: DC | PRN
  Administered 2018-03-22 – 2018-03-23 (×2): 5 mg via ORAL
  Filled 2018-03-21 (×2): qty 1

## 2018-03-21 MED ORDER — BUPROPION HCL ER (XL) 150 MG PO TB24
150.0000 mg | ORAL_TABLET | Freq: Every day | ORAL | Status: AC
  Filled 2018-03-21: qty 1

## 2018-03-21 MED ORDER — HYDROXYZINE HCL 50 MG PO TABS
50.0000 mg | ORAL_TABLET | Freq: Four times a day (QID) | ORAL | Status: DC | PRN
  Administered 2018-03-21 – 2018-03-25 (×7): 50 mg via ORAL
  Filled 2018-03-21 (×8): qty 1

## 2018-03-21 MED ORDER — OXCARBAZEPINE 300 MG PO TABS
300.0000 mg | ORAL_TABLET | Freq: Two times a day (BID) | ORAL | Status: DC
  Administered 2018-03-21 – 2018-03-22 (×2): 300 mg via ORAL
  Filled 2018-03-21 (×6): qty 1

## 2018-03-21 MED ORDER — BUPROPION HCL 75 MG PO TABS
75.0000 mg | ORAL_TABLET | ORAL | Status: DC
  Administered 2018-03-23: 75 mg via ORAL
  Filled 2018-03-21 (×3): qty 1

## 2018-03-21 NOTE — [facility] Suicide Risk Assessment (Addendum)
[facility] INPATIENT:  Family/Significant Other Suicide Prevention Education  Suicide Prevention Education:  Education Completed; No one has been identified by the patient as the family member/significant other with whom the patient will be residing, and identified as the person(s) who will aid the patient in the event of a mental health crisis (suicidal ideations/suicide attempt).  With written consent from the patient, the family member/significant other has been provided the following suicide prevention education, prior to the and/or following the discharge of the patient.  The suicide prevention education provided includes the following:  Suicide risk factors  Suicide prevention and interventions  National Suicide Hotline telephone number  [facility] assessment telephone number  [facility] [place] Emergency Assistance 911  [facility] and/or Residential Mobile Crisis Unit telephone number  Request made of family/significant other to:  Remove weapons (e.g., guns, rifles, knives), all items previously/currently identified as safety concern.    Remove drugs/medications (over-the-counter, prescriptions, illicit drugs), all items previously/currently identified as a safety concern.  The family member/significant other verbalizes understanding of the suicide prevention education information provided.  The family member/significant other agrees to remove the items of safety concern listed above. The patient did not endorse SI at the time of admission, nor did the patient c/o SI during the stay here.  SPE not required.  However, I did speak with son Kelly Carr, [phone], nd went over treatment team recommendations and crises plan.  Ida Rogue 03/21/2018, 8:18 AM

## 2018-03-21 NOTE — Progress Notes (Signed)
Patient completed a 72 hour notice for discharge form on 03/21/18 at 1115 am.  Physician informed of patient's request for discharge.

## 2018-03-21 NOTE — Progress Notes (Signed)
EKG obtained and put in chart
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
## 2018-03-21 NOTE — Progress Notes (Signed)
Patient denies SI, HI and AVH this shift.  Patient has been irritable, paranoid and agitated.  Patient requires PRN medications to help ease symptoms of irritability.    Assess patient for safety, offer medications as prescribed, engage patient in 1:1 staff talks.   Patient able to contract for safety.  Continue to monitor as planned.

## 2018-03-21 NOTE — Progress Notes (Signed)
Adult Psychoeducational Group Note  Date:  03/21/2018 Time:  8:58 PM  Group Topic/Focus:  Wrap-Up Group:   The focus of this group is to help patients review their daily goal of treatment and discuss progress on daily workbooks.  Participation Level:  Active  Participation Quality:  Appropriate  Affect:  Appropriate  Cognitive:  Appropriate  Insight: Appropriate  Engagement in Group:  Engaged  Modes of Intervention:  Discussion  Additional Comments: The patient expressed that she rates today a 7.The patient also said that she attended group.  Octavio Manns 03/21/2018, 8:58 PM
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
## 2018-03-21 NOTE — Progress Notes (Signed)
Recreation Therapy Notes  Date: 10.16.19 Time: 1000 Location: 500 Hall Dayroom  Group Topic: Communication  Goal Area(s) Addresses:  Patient will effectively communicate with peers in group.  Patient will verbalize benefit of healthy communication. Patient will verbalize positive effect of healthy communication on post d/c goals.   Intervention: Geometrical drawings, pencils, blank paper  Activity: Back to Back Drawings.  Patients were paired off into groups of two. Patients are seated back to back.  One person in each group was given a picture to describe to their partner.  The partner is to draw the picture as it is described to them.  The person drawing could not ask any questions of the person giving the instructions.  Once finished, partners will compare pictures and then switch roles.  LRT will then give each group a different picture.  Education: Communication, Discharge Planning  Education Outcome: Acknowledges understanding/In group clarification offered/Needs additional education.   Clinical Observations/Feedback:  Pt did not attend group.    Szymon Foiles, LRT/CTRS         Kaci Freel A 03/21/2018 12:32 PM 
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
## 2018-03-22 ENCOUNTER — Ambulatory Visit: Payer: BLUE CROSS/BLUE SHIELD | Admitting: Psychiatry

## 2018-03-22 DIAGNOSIS — F17210 Nicotine dependence, cigarettes, uncomplicated: Secondary | ICD-10-CM

## 2018-03-22 DIAGNOSIS — G4700 Insomnia, unspecified: Secondary | ICD-10-CM

## 2018-03-22 DIAGNOSIS — F419 Anxiety disorder, unspecified: Secondary | ICD-10-CM

## 2018-03-22 MED ORDER — OXCARBAZEPINE 300 MG PO TABS
600.0000 mg | ORAL_TABLET | Freq: Every day | ORAL | Status: DC
  Administered 2018-03-22 – 2018-03-23 (×2): 600 mg via ORAL
  Filled 2018-03-22 (×4): qty 2

## 2018-03-22 MED ORDER — OXCARBAZEPINE 300 MG PO TABS
300.0000 mg | ORAL_TABLET | Freq: Every day | ORAL | Status: DC
  Administered 2018-03-23 – 2018-03-26 (×4): 300 mg via ORAL
  Filled 2018-03-22 (×6): qty 1

## 2018-03-22 NOTE — Progress Notes (Signed)
Recreation Therapy Notes  Date: 10.17.19 Time: 1000 Location: 500 Hall Dayroom  Group Topic: Stress Management  Goal Area(s) Addresses:  Patient will verbalize importance of using healthy stress management.  Patient will identify positive emotions associated with healthy stress management.   Behavioral Response: Engaged  Intervention: Stress Management  Activity :  Deep Breathing & Guided Imagery.  LRT introduced the stress management techniques of deep breathing and guided imagery.  LRT led patients in the proper way to practice deep breathing.  LRT then played a guided imagery that allowed patients to further relax on the beach at sunset.  Education:  Stress Management, Discharge Planning.   Education Outcome: Acknowledges edcuation/In group clarification offered/Needs additional education  Clinical Observations/Feedback: Pt expressed family can cause stress.  Pt actively participated in group session.  Pt laid down on the chairs at one point to get comfortable.  Pt expressed she had a hard time focusing on the activity because "of the things I'm going through and the distractions".  Pt also explained that she has been told to try meditation but has never done so.  Pt stated she was going to start doing meditation and yoga.     Caroll Rancher, LRT/CTRS      Lillia Abed, Naasia Weilbacher A 03/22/2018 10:50 AM

## 2018-03-22 NOTE — Progress Notes (Signed)
[facility] MD Progress Note  03/22/2018 3:31 PM Kelly Carr  MRN:  [id number]  Subjective: Kelly Carr reports, "I'm here because of stress from my family. I cannot put up with my 45 year old daughter's behavior any more. Monday was my birthday, I missed my family. They live in [place]. I drove up to [facility] to see my folks. I have been going through a lot of stress. My mother suffered heart attack. She is the only one person that cares about me. I had to see her & have lunch with her. But, my family thought I was crazy. I take medicine for bipolar & I was doing okay on them. It was my birthday, what if I had a little drink of alcohol? It would not be a crime. What did they do, call the cops on me. The cop cornered me & said, you better get in the car or I mo f----- u. He put me in jail for doing nothing. I'm frustrated, hurt & angry at my family. I used to abuse substances, not any more. I'm here against my will".  Kelly Carr is a 47 y/o F with history of bipolar disorder who was admitted on IVC initiated in the ED after she had presented there brought in by family with worsening symptoms of mania, mood lability, irritability, pressured speech, distractibility, and flight of ideas. Pt had been evaluated and discharged from the ED, but she continued to have altercation with her family in the parking lot of the hospital, resulting in police being summoned and pt was returned to [facility]. Pt was medically cleared and then transferred to [facility] for additional evaluation and treatment. Upon initial interview, pt presents as pressured, irritable, and mildly elevated. She describes convoluted story of how she was celebrating her birthday yesterday, and eventually she had a conflict with her family members which continued to escalate until they brought her to the ED for evaluation. She was discharged, but then she continued to yell and have verbal altercation with her son's girlfriend, and the police were contacted. Pt was  initially taken to jail and then back to ED on IVC, and she was admitted to [facility]. Pt asserts she has been doing well overall in recent weeks and she has been following up closely with her therapist and provider at [facility]. She denies SI/HI/AH/VH. She denies other symptoms of depression. She reports previous episodes of decreased need for sleep, distractibility, and flight of ideas, but she denies having these symptoms recently. She smokes 1 ppd, but she denies other illicit substance use.  Kelly Carr is seen, chart reviewed. The chart findings discussed with the treatment team. She is alert & oriented. She is visible on the unit, attending group sessions & activities. She present manic with pressured speech, tangential & circumstantial. She is noted to jump from topic to topic, very difficult to redirect. She has poor insight about her situation & condition at this time. She does not think that she has any issues going on. At this time, she is taking & tolerating her current treatment regimen. Per the attending psychiatrist, Kelly Carr does not want her current medications adjusted. And because she presents highly manic & has poor insight about her current worsening symptoms, the treatment has agreed on adjusting her Trileptal to 300 mg daily & 600 mg Q hs. See MAR. She currently denies any SIHI, AVH, delusional thoughts or paranoia. We will continue her current plan of care as ready adjusted & in progress.  Principal Problem: Severe bipolar I  disorder, current or most recent episode mixed [facility])  Diagnosis:   Patient Active Problem List   Diagnosis Date Noted  . PTSD (post-traumatic stress disorder) [F43.10] 03/21/2018  . Constipation [K59.00] 04/14/2017  . Tendinopathy of right gluteus medius [M67.98] 04/05/2017  . Productive cough [R05] 03/06/2017  . Major depressive disorder, recurrent severe without psychotic features (HCC) [F33.2] 02/17/2017  . Manic behavior (HCC) [F30.10]   . Sprain of left ring finger  [S63.615A] 02/15/2017  . Chest pain [R07.9] 11/23/2016  . Ingrown toenail without infection [L60.0] 09/03/2016  . Right shoulder pain [M25.511] 03/08/2016  . Piriformis syndrome of right side [G57.01] 02/23/2016  . Right shoulder injury [S49.91XA] 02/17/2016  . HLD (hyperlipidemia) [E78.5] 11/12/2015  . Dysuria [R30.0] 05/26/2015  . Normocytic anemia [D64.9] 05/26/2015  . Pain of upper abdomen [R10.10] 05/04/2015  . Low grade squamous intraepithelial lesion (LGSIL) on cervical Pap smear [R87.612] 03/04/2015  . Labral tear of hip joint [S73.199A] 12/27/2014  . Pharyngitis, chronic [J31.2] 08/11/2014  . Irregular menses [N92.6] 07/17/2013  . GERD (gastroesophageal reflux disease) [K21.9] 11/10/2010  . Carpal tunnel syndrome of left wrist [G56.02] 11/10/2010  . Severe bipolar I disorder, current or most recent episode mixed (HCC) [F31.63] 11/25/2008  . OTH ABNORMAL PAPANICOLAOU SMEAR CERVIX&CERV HPV [R87.89] 11/25/2008  . DEPRESSION [F32.9] 10/21/2008   Total Time spent with patient: 25 minutes  Past Psychiatric History: See H&P  Past Medical History:  Past Medical History:  Diagnosis Date  . Abnormal Pap smear    Unknown results>colpo>normal  . Anxiety   . Arthritis   . Bipolar 1 disorder (HCC)    History reviewed. No pertinent surgical history.  Family History:  Family History  Problem Relation Age of Onset  . Diabetes Mother   . Hypertension Mother   . Heart disease Mother 17  . Diabetes Maternal Grandmother   . Heart disease Maternal Grandmother   . Diabetes Maternal Grandfather   . Heart disease Maternal Grandfather   . Breast cancer Neg Hx    Family Psychiatric  History: See H&P  Social History:  Social History   Substance and Sexual Activity  Alcohol Use Yes   Comment: drinks wine occasionally     Social History   Substance and Sexual Activity  Drug Use No    Social History   Socioeconomic History  . Marital status: Single    Spouse name: Not on  file  . Number of children: Not on file  . Years of education: Not on file  . Highest education level: Not on file  Occupational History  . Not on file  Social Needs  . Financial resource strain: Not on file  . Food insecurity:    Worry: Not on file    Inability: Not on file  . Transportation needs:    Medical: Not on file    Non-medical: Not on file  Tobacco Use  . Smoking status: Current Every Day Smoker    Packs/day: 0.50    Types: Cigarettes  . Smokeless tobacco: Never Used  Substance and Sexual Activity  . Alcohol use: Yes    Comment: drinks wine occasionally  . Drug use: No  . Sexual activity: Yes    Partners: Male    Birth control/protection: Pill  Lifestyle  . Physical activity:    Days per week: Not on file    Minutes per session: Not on file  . Stress: Not on file  Relationships  . Social connections:    Talks on phone: Not  on file    Gets together: Not on file    Attends religious service: Not on file    Active member of club or organization: Not on file    Attends meetings of clubs or organizations: Not on file    Relationship status: Not on file  Other Topics Concern  . Not on file  Social History Narrative  . Not on file   Additional Social History:   Sleep: Good  Appetite:  Good  Current Medications: Current Facility-Administered Medications  Medication Dose Route Frequency Provider Last Rate Last Dose  . acetaminophen (TYLENOL) tablet 650 mg  650 mg Oral Q6H PRN Maryagnes Amos, FNP   650 mg at 03/22/18 0817  . alum & mag hydroxide-simeth (MAALOX/MYLANTA) 200-200-20 MG/5ML suspension 30 mL  30 mL Oral Q4H PRN Starkes-Perry, Juel Burrow, FNP      . buPROPion (WELLBUTRIN XL) 24 hr tablet 150 mg  150 mg Oral Daily Micheal Likens, MD      . Melene Muller ON 03/23/2018] buPROPion [facility]) tablet 75 mg  75 mg Oral Billie Lade T, MD      . cariprazine (VRAYLAR) capsule 3 mg  3 mg Oral Daily Micheal Likens, MD   3  mg at 03/21/18 2147  . cyclobenzaprine (FLEXERIL) tablet 5 mg  5 mg Oral TID PRN Micheal Likens, MD      . guaiFENesin (MUCINEX) 12 hr tablet 600 mg  600 mg Oral BID PRN Micheal Likens, MD   600 mg at 03/21/18 2231  . hydrOXYzine (ATARAX/VISTARIL) tablet 50 mg  50 mg Oral Q6H PRN Micheal Likens, MD   50 mg at 03/22/18 0817  . ibuprofen (ADVIL,MOTRIN) tablet 600 mg  600 mg Oral Q6H PRN Micheal Likens, MD   600 mg at 03/22/18 0159  . Influenza vac split quadrivalent PF (FLUARIX) injection 0.5 mL  0.5 mL Intramuscular Tomorrow-1000 [place], Christopher T, MD      . ziprasidone (GEODON) injection 20 mg  20 mg Intramuscular Q12H PRN Maryagnes Amos, FNP       And  . LORazepam (ATIVAN) tablet 1 mg  1 mg Oral PRN Starkes-Perry, Juel Burrow, FNP      . magnesium hydroxide (MILK OF MAGNESIA) suspension 30 mL  30 mL Oral Daily PRN Starkes-Perry, Juel Burrow, FNP      . nicotine polacrilex (NICORETTE) gum 2 mg  2 mg Oral PRN Micheal Likens, MD   2 mg at 03/22/18 0817  . norgestimate-ethinyl estradiol (ORTHO-CYCLEN,SPRINTEC,PREVIFEM) 0.25-35 MG-MCG tablet 1 tablet  1 tablet Oral Daily Starkes-Perry, Juel Burrow, FNP      . OLANZapine zydis (ZYPREXA) disintegrating tablet 5 mg  5 mg Oral Q8H PRN Micheal Likens, MD   5 mg at 03/21/18 1451  . Oxcarbazepine (TRILEPTAL) tablet 300 mg  300 mg Oral BID Micheal Likens, MD   300 mg at 03/22/18 0818  . pneumococcal 23 valent vaccine (PNU-IMMUNE) injection 0.5 mL  0.5 mL Intramuscular Tomorrow-1000 Jolyne Loa T, MD      . prazosin (MINIPRESS) capsule 2 mg  2 mg Oral QHS Micheal Likens, MD   2 mg at 03/21/18 2146  . sertraline (ZOLOFT) tablet 50 mg  50 mg Oral Daily Micheal Likens, MD   50 mg at 03/22/18 0818  . traZODone (DESYREL) tablet 50 mg  50 mg Oral QHS PRN,MR X 1 Nira Conn A, NP   50 mg at 03/22/18 0200   Lab Results:  Results for orders placed or performed  during the hospital encounter of 03/20/18 (from the past 48 hour(s))  Hemoglobin A1c     Status: None   Collection Time: 03/21/18  6:35 AM  Result Value Ref Range   Hgb A1c MFr Bld 5.0 4.8 - 5.6 %    Comment: (NOTE) Pre diabetes:          5.7%-6.4% Diabetes:              >6.4% Glycemic control for   <7.0% adults with diabetes    Mean Plasma Glucose 96.8 mg/dL    Comment: Performed at [facility], [address], [place], [place] [address]  Lipid panel     Status: Abnormal   Collection Time: 03/21/18  6:35 AM  Result Value Ref Range   Cholesterol 195 0 - 200 mg/dL   Triglycerides 87 <604 mg/dL   HDL 60 >54 mg/dL   Total CHOL/HDL Ratio 3.3 RATIO   VLDL 17 0 - 40 mg/dL   LDL Cholesterol 098 (H) 0 - 99 mg/dL    Comment:        Total Cholesterol/HDL:CHD Risk Coronary Heart Disease Risk Table                     Men   Women  1/2 Average Risk   3.4   3.3  Average Risk       5.0   4.4  2 X Average Risk   9.6   7.1  3 X Average Risk  23.4   11.0        Use the calculated Patient Ratio above and the CHD Risk Table to determine the patient's CHD Risk.        ATP III CLASSIFICATION (LDL):  <100     mg/dL   Optimal  119-147  mg/dL   Near or Above                    Optimal  130-159  mg/dL   Borderline  829-562  mg/dL   High  >130     mg/dL   Very High Performed at [facility], [address] [place], [place] [address]   TSH     Status: None   Collection Time: 03/21/18  6:35 AM  Result Value Ref Range   TSH 1.380 0.350 - 4.500 uIU/mL    Comment: Performed by a 3rd Generation assay with a functional sensitivity of <=0.01 uIU/mL. Performed at [facility], [address] [place], [place] [address]    Blood Alcohol level:  Lab Results  Component Value Date   ETH <10 03/20/2018   ETH <5 02/17/2017   Metabolic Disorder Labs: Lab Results  Component Value Date   HGBA1C 5.0 03/21/2018   MPG 96.8 03/21/2018   Lab Results   Component Value Date   PROLACTIN 2.0 12/05/2011   Lab Results  Component Value Date   CHOL 195 03/21/2018   TRIG 87 03/21/2018   HDL 60 03/21/2018   CHOLHDL 3.3 03/21/2018   VLDL 17 03/21/2018   LDLCALC 118 (H) 03/21/2018   LDLCALC 101 (H) 05/20/2016   Physical Findings: AIMS: Facial and Oral Movements Muscles of Facial Expression: None, normal Lips and Perioral Area: None, normal Jaw: None, normal Tongue: None, normal,Extremity Movements Upper (arms, wrists, hands, fingers): None, normal Lower (legs, knees, ankles, toes): None, normal, Trunk Movements Neck, shoulders, hips: None, normal, Overall Severity Severity of abnormal  movements (highest score from questions above): None, normal Incapacitation due to abnormal movements: None, normal Patient's awareness of abnormal movements (rate only patient's report): No Awareness, Dental Status Current problems with teeth and/or dentures?: No Does patient usually wear dentures?: No  CIWA:    COWS:     Musculoskeletal: Strength & Muscle Tone: within normal limits Gait & Station: normal Patient leans: N/A  Psychiatric Specialty Exam: Physical Exam  Nursing note and vitals reviewed.   ROS  Blood pressure (!) 145/76, pulse (!) 118, temperature 97.7 F (36.5 C), resp. rate 18, height 5\' 4"  (1.626 m), weight 60.8 kg, last menstrual period 02/27/2018.Body mass index is 23 kg/m.  General Appearance: Casual and Fairly Groomed  Eye Contact:  Good  Speech:  Clear and Coherent and Normal Rate  Volume:  Normal  Mood:  Anxious and Irritable  Affect:  Congruent, Labile and Full Range  Thought Process:  Coherent and Goal Directed  Orientation:  Full (Time, Place, and Person)  Thought Content:  Logical  Suicidal Thoughts:  No  Homicidal Thoughts:  No  Memory:  Immediate;   Fair Recent;   Fair Remote;   Fair  Judgement:  Poor  Insight:  Lacking  Psychomotor Activity:  Normal  Concentration:  Concentration: Fair  Recall:  [employer] of Knowledge:  Fair  Language:  Fair  Akathisia:  No  Handed:    AIMS (if indicated):     Assets:  Resilience Social Support  ADL's:  Intact  Cognition:  WNL  Sleep:  Number of Hours: 5.25     Treatment Plan Summary: Daily contact with patient to assess and evaluate symptoms and progress in treatment.  - Continue inpatient hospitalization.  - Will continue today 03/22/2018 plan as below except where it is noted.  Depression.     - Continue Wellbutrin 75 mg mg po daily x 7 days.     - Sertraline 50 mg po daily.  PTSD.     - Continue Minipress 2 mg Q hs.  Mood control     - Continue caripazine (Vraylar) 3 mg po daily.  Anxiety.     - Continue Hydroxyzine 50 mg po Q 6 hours prn.  Mood control.     - Continue Trileptal 300 mg po daily.     - Increased bedtime Trileptal from 300 mg to Trileptal 600 mg po Q hs starting tonight.  Agitation protocols.      - Continue Zyprexa zydis 5 mg po tid prn.      -  Continue Geodon 20 mg IM Q 12 hours prn.      - Continue Lorazepam 1 mg po as needed x 1 dose.  Insomnia.      - Continue Trazodone 50 mg po Q hs prn, may repeat x 1 prn.  Other medical issues.      - Continue Flexeril 5 mg po tid prn for muscle spasms.      - Continue Mucinex 600 mg po bid for cough.      - Continue norgestimate-ethyl estradiol 0.25-35 MG-MCG po daily-birth control.  Patient to attend & participate in the group sessions.  Discharge disposition is ongoing.  Armandina Stammer, NP, PMHNP, FNP-BC. 03/22/2018, 3:31 PM

## 2018-03-22 NOTE — Progress Notes (Signed)
Patient ID: Kelly Carr, female   DOB: 13-Jul-1970, 47 y.o.   MRN: [id number]   D: Patient pleasant on approach tonight. Reports having some issues with her family but besides that she feels that her mood is good. Let undersigned do an EKG. Conversation very appropriate tonight with no paranoia. Talked on phone and attended group tonight. Denies any SI, HI, or a/v hallucinations. Having some cold symptoms. A: Staff will continue to monitor on q 15 minute checks, follow treatment plan, and give medications as ordered. R: Cooperative on the unit.

## 2018-03-22 NOTE — [facility] Group Notes (Signed)
LCSW Group Therapy Note  03/22/2018 1:26 PM  Type of Therapy/Topic:  Group Therapy:  Emotion Regulation  Participation Level:  Active   Description of Group:   The purpose of this group is to assist patients in learning to regulate negative emotions and experience positive emotions. Patients will be guided to discuss ways in which they have been vulnerable to their negative emotions. These vulnerabilities will be juxtaposed with experiences of positive emotions or situations, and patients will be challenged to use positive emotions to combat negative ones. Special emphasis will be placed on coping with negative emotions in conflict situations, and patients will process healthy conflict resolution skills.  Therapeutic Goals: 1. Patient will identify two positive emotions or experiences to reflect on in order to balance out   negative emotions 2. Patient will label two or more emotions that they find the most difficult to experience 3. Patient will demonstrate positive conflict resolution skills through discussion and/or role plays  Summary of Patient Progress: Kelly Carr attended the entire session. She described anger as an emotion that she has difficulty regulating. Kelly Carr reported that she needs to be able to breath then communicate to her support system when she needs help. When I tell them, "take my keys, I should not be driving, I should not be behind the wheel.Marland KitchenMarland KitchenI ended up in another state". Kelly Carr states "People say 'you are a beautiful person'...I need to be able to look in the mirror and see what they see." She presented with pressured speech, flight of ideas and mood lability.    Therapeutic Modalities:   Cognitive Behavioral Therapy Feelings Identification Dialectical Behavioral Therapy   Marian Sorrow MSW Intern 03/22/2018 1:26 PM

## 2018-03-22 NOTE — Plan of Care (Signed)
  Problem: Activity: Goal: Interest or engagement in activities will improve Outcome: Progressing   Problem: Safety: Goal: Periods of time without injury will increase Outcome: Progressing  DAR NOTE: Patient presents with anxious affect and irritable mood.  Denies suicidal thoughts, auditory and visual hallucinations.  Rates depression at 0, hopelessness at 0, and anxiety at 0.  Maintained on routine safety checks.  Medications given as prescribed.  Support and encouragement offered as needed.  Attended group and participated.  States goal for today is "stay calm and focus on getting out of here."  Patient visible in milieu with minimal interactions.  Became very agitated and irritable after getting off the phone with her son.  States no body believes in her but family blames her.  Requested and received Vistaril 50 mg for complain of anxiety with good effect.  Patient is safe on and off the unit.

## 2018-03-22 NOTE — Progress Notes (Signed)
Adult Psychoeducational Group Note  Date:  03/22/2018 Time:  8:52 PM  Group Topic/Focus:  Wrap-Up Group:   The focus of this group is to help patients review their daily goal of treatment and discuss progress on daily workbooks.  Participation Level:  Active  Participation Quality:  Appropriate  Affect:  Appropriate  Cognitive:  Appropriate  Insight: Appropriate  Engagement in Group:  Engaged  Modes of Intervention:  Discussion  Additional Comments: The patient expressed that she rates 5.The patient also said that she attended groups.  Octavio Manns 03/22/2018, 8:52 PM

## 2018-03-22 NOTE — Progress Notes (Signed)
D    Pt is calm and cooperative   She reports feeling upset and anxious about what is going on in her family   She endorses depression and anxiety   She said she wanted to take her medication early so she can calm herself down A   Verbal support and encouragement given   Medications administered and effectiveness monitored   Q 15 min checks R   Pt is safe at this time

## 2018-03-23 MED ORDER — ARIPIPRAZOLE 15 MG PO TABS
15.0000 mg | ORAL_TABLET | Freq: Every day | ORAL | Status: DC
  Administered 2018-03-23 – 2018-03-25 (×3): 15 mg via ORAL
  Filled 2018-03-23 (×4): qty 1

## 2018-03-23 MED ORDER — PANTOPRAZOLE SODIUM 40 MG PO TBEC
40.0000 mg | DELAYED_RELEASE_TABLET | Freq: Every day | ORAL | Status: DC
  Administered 2018-03-23 – 2018-03-26 (×4): 40 mg via ORAL
  Filled 2018-03-23 (×6): qty 1

## 2018-03-23 MED ORDER — BUPROPION HCL 75 MG PO TABS
37.5000 mg | ORAL_TABLET | ORAL | Status: DC
  Administered 2018-03-24 – 2018-03-26 (×3): 37.5 mg via ORAL
  Filled 2018-03-23 (×5): qty 0.5

## 2018-03-23 MED ORDER — GUAIFENESIN-DM 100-10 MG/5ML PO SYRP
5.0000 mL | ORAL_SOLUTION | ORAL | Status: DC | PRN
  Administered 2018-03-23: 5 mL via ORAL
  Filled 2018-03-23: qty 5

## 2018-03-23 NOTE — Tx Team (Signed)
Interdisciplinary Treatment and Diagnostic Plan Update  03/23/2018 Time of Session: 9:49 AM  Kelly Carr MRN: [id number]  Principal Diagnosis: Severe bipolar I disorder, current or most recent episode mixed ([place])  Secondary Diagnoses: Principal Problem:   Severe bipolar I disorder, current or most recent episode mixed ([place]) Active Problems:   PTSD (post-traumatic stress disorder)   Current Medications:  Current Facility-Administered Medications  Medication Dose Route Frequency Provider Last Rate Last Dose  . acetaminophen (TYLENOL) tablet 650 mg  650 mg Oral Q6H PRN Suella Broad, FNP   650 mg at 03/23/18 0805  . alum & mag hydroxide-simeth (MAALOX/MYLANTA) 200-200-20 MG/5ML suspension 30 mL  30 mL Oral Q4H PRN Starkes-Perry, Gayland Curry, FNP      . buPROPion [facility]) tablet 75 mg  75 mg Oral Rockne Menghini, MD   75 mg at 03/23/18 0806  . cariprazine (VRAYLAR) capsule 3 mg  3 mg Oral Daily Pennelope Bracken, MD   3 mg at 03/22/18 2035  . cyclobenzaprine (FLEXERIL) tablet 5 mg  5 mg Oral TID PRN Pennelope Bracken, MD   5 mg at 03/22/18 2345  . guaiFENesin (MUCINEX) 12 hr tablet 600 mg  600 mg Oral BID PRN Pennelope Bracken, MD   600 mg at 03/22/18 2132  . hydrOXYzine (ATARAX/VISTARIL) tablet 50 mg  50 mg Oral Q6H PRN Pennelope Bracken, MD   50 mg at 03/22/18 1838  . ibuprofen (ADVIL,MOTRIN) tablet 600 mg  600 mg Oral Q6H PRN Pennelope Bracken, MD   600 mg at 03/22/18 2132  . Influenza vac split quadrivalent PF (FLUARIX) injection 0.5 mL  0.5 mL Intramuscular Tomorrow-1000 [place], Christopher T, MD      . ziprasidone (GEODON) injection 20 mg  20 mg Intramuscular Q12H PRN Suella Broad, FNP       And  . LORazepam (ATIVAN) tablet 1 mg  1 mg Oral PRN Starkes-Perry, Gayland Curry, FNP      . magnesium hydroxide (MILK OF MAGNESIA) suspension 30 mL  30 mL Oral Daily PRN Starkes-Perry, Gayland Curry, FNP      . nicotine polacrilex  (NICORETTE) gum 2 mg  2 mg Oral PRN Pennelope Bracken, MD   2 mg at 03/22/18 2034  . norgestimate-ethinyl estradiol (ORTHO-CYCLEN,SPRINTEC,PREVIFEM) 0.25-35 MG-MCG tablet 1 tablet  1 tablet Oral Daily Starkes-Perry, Gayland Curry, FNP      . OLANZapine zydis (ZYPREXA) disintegrating tablet 5 mg  5 mg Oral Q8H PRN Pennelope Bracken, MD   5 mg at 03/21/18 1451  . Oxcarbazepine (TRILEPTAL) tablet 300 mg  300 mg Oral Daily Lindell Spar I, NP   300 mg at 03/23/18 0804  . Oxcarbazepine (TRILEPTAL) tablet 600 mg  600 mg Oral QHS Lindell Spar I, NP   600 mg at 03/22/18 2036  . pneumococcal 23 valent vaccine (PNU-IMMUNE) injection 0.5 mL  0.5 mL Intramuscular Tomorrow-1000 Maris Berger T, MD      . prazosin (MINIPRESS) capsule 2 mg  2 mg Oral QHS Pennelope Bracken, MD   2 mg at 03/22/18 2035  . sertraline (ZOLOFT) tablet 50 mg  50 mg Oral Daily Pennelope Bracken, MD   50 mg at 03/23/18 0803  . traZODone (DESYREL) tablet 50 mg  50 mg Oral QHS PRN,MR X 1 Lindon Romp A, NP   50 mg at 03/22/18 2345    PTA Medications: Medications Prior to Admission  Medication Sig Dispense Refill Last Dose  . Ginkgo Biloba (GNP GINGKO BILOBA  EXTRACT PO) Take 1 tablet by mouth daily.   03/19/2018 at Unknown time  . hydrOXYzine (VISTARIL) 50 MG capsule Take 50 mg by mouth 3 (three) times daily as needed.  1 03/19/2018 at Unknown time  . naproxen (EC NAPROSYN) 500 MG EC tablet Take 1 tablet (500 mg total) by mouth 2 (two) times daily with a meal. (Patient not taking: Reported on 03/19/2018) 20 tablet 0 Not Taking at Unknown time  . norgestimate-ethinyl estradiol (ORTHO-CYCLEN,SPRINTEC,PREVIFEM) 0.25-35 MG-MCG tablet Take 1 tablet by mouth daily. (Patient not taking: Reported on 03/19/2018) 1 Package 13 Not Taking at Unknown time  . Oxcarbazepine (TRILEPTAL) 300 MG tablet Take 300 mg by mouth 2 (two) times daily.  1 03/19/2018 at Unknown time  . VRAYLAR capsule Take 3 mg by mouth at bedtime.  1  03/18/2018 at Unknown time    Patient Stressors: Marital or family conflict Occupational concerns Other: mother had recent heart attack  Patient Strengths: Ability for insight Average or above average intelligence Capable of independent living General fund of knowledge  Treatment Modalities: Medication Management, Group therapy, Case management,  1 to 1 session with clinician, Psychoeducation, Recreational therapy.   Physician Treatment Plan for Primary Diagnosis: Severe bipolar I disorder, current or most recent episode mixed ([place]) Long Term Goal(s): Improvement in symptoms so as ready for discharge  Short Term Goals: Ability to identify and develop effective coping behaviors will improve Ability to demonstrate self-control will improve  Medication Management: Evaluate patient's response, side effects, and tolerance of medication regimen.  Therapeutic Interventions: 1 to 1 sessions, Unit Group sessions and Medication administration.  Evaluation of Outcomes: Progressing  Physician Treatment Plan for Secondary Diagnosis: Principal Problem:   Severe bipolar I disorder, current or most recent episode mixed ([place]) Active Problems:   PTSD (post-traumatic stress disorder)   Long Term Goal(s): Improvement in symptoms so as ready for discharge  Short Term Goals: Ability to identify and develop effective coping behaviors will improve Ability to demonstrate self-control will improve  Medication Management: Evaluate patient's response, side effects, and tolerance of medication regimen.  Therapeutic Interventions: 1 to 1 sessions, Unit Group sessions and Medication administration.  Evaluation of Outcomes: Progressing   RN Treatment Plan for Primary Diagnosis: Severe bipolar I disorder, current or most recent episode mixed ([place]) Long Term Goal(s): Knowledge of disease and therapeutic regimen to maintain health will improve  Short Term Goals: Ability to identify and develop  effective coping behaviors will improve and Compliance with prescribed medications will improve  Medication Management: RN will administer medications as ordered by provider, will assess and evaluate patient's response and provide education to patient for prescribed medication. RN will report any adverse and/or side effects to prescribing provider.  Therapeutic Interventions: 1 on 1 counseling sessions, Psychoeducation, Medication administration, Evaluate responses to treatment, Monitor vital signs and CBGs as ordered, Perform/monitor CIWA, COWS, AIMS and Fall Risk screenings as ordered, Perform wound care treatments as ordered.  Evaluation of Outcomes: Progressing   LCSW Treatment Plan for Primary Diagnosis: Severe bipolar I disorder, current or most recent episode mixed ([place]) Long Term Goal(s): Safe transition to appropriate next level of care at discharge, Engage patient in therapeutic group addressing interpersonal concerns.  Short Term Goals: Engage patient in aftercare planning with referrals and resources  Therapeutic Interventions: Assess for all discharge needs, 1 to 1 time with Social worker, Explore available resources and support systems, Assess for adequacy in community support network, Educate family and significant other(s) on suicide prevention, Complete Psychosocial Assessment,  Interpersonal group therapy.  Evaluation of Outcomes: Met Return home, follow up outpt   Progress in Treatment: Attending groups: Yes Participating in groups: Yes Taking medication as prescribed: Yes Toleration medication: Yes, no side effects reported at this time Family/Significant other contact made: Yes Patient understands diagnosis: Yes AEB asking for help with mania Discussing patient identified problems/goals with staff: Yes Medical problems stabilized or resolved: Yes Denies suicidal/homicidal ideation: Yes Issues/concerns per patient self-inventory: None Other: N/A  New problem(s)  identified: None identified at this time.   New Short Term/Long Term Goal(s): "Remember to stay grounded with my inner peace, and remembering to btreathe.  Also, my anger."   Discharge Plan or Barriers:   Reason for Continuation of Hospitalization:  Mania  Medication stabilization   Estimated Length of Stay:10/23  Attendees: Patient: Kelly Carr 03/23/2018  9:49 AM  Physician: Maris Berger, MD 03/23/2018  9:49 AM  Nursing: Benjamine Mola, RN 03/23/2018  9:49 AM  RN Care Manager: Lars Pinks, RN 03/23/2018  9:49 AM  Social Worker: Ripley Fraise 03/23/2018  9:49 AM  Recreational Therapist: Winfield Cunas 03/23/2018  9:49 AM  Other: Norberto Sorenson 03/23/2018  9:49 AM  Other:  03/23/2018  9:49 AM    Scribe for Treatment Team:  Roque Lias LCSW 03/23/2018 9:49 AM

## 2018-03-23 NOTE — Progress Notes (Signed)
Pt did attend morning group, and actively participated.
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
## 2018-03-23 NOTE — Progress Notes (Signed)
Recreation Therapy Notes  Date: 10.18.19 Time: 1000 Location: 500 Hall Dayroom  Group Topic: Communication, Team Building, Problem Solving  Goal Area(s) Addresses:  Patient will effectively work with peer towards shared goal.  Patient will identify skill used to make activity successful.  Patient will identify how skills used during activity can be used to reach post d/c goals.   Behavioral Response: Engaged  Intervention: STEM Activity   Activity: Glass blower/designer. In teams, patients were asked to build the tallest freestanding tower possible out of 15 pipe cleaners. Systematically resources were removed, for example patient ability to use both hands and patient ability to verbally communicate.    Education:Social Skills, Discharge Planning.   Education Outcome: Acknowledges education/In group clarification offered/Needs additional education.   Clinical Observations/Feedback:  Pt was engaged and bright during group.  Pt stated the group used communication even though the communication wasn't the best.  Pt explained "my family doesn't take me serious when I ask for help.  They will tell me to snap out of it or say why are you acting crazy". Pt also stated she was having a family session with her son and daughter so they can get an understanding of her bi-polar.  Pt went on to say she is a the point where she wants to "do me" and "let them do them".  Pt emphasized she gets love and support from her friends.      Caroll Rancher, LRT/CTRS     Lillia Abed, Disa Riedlinger A 03/23/2018 11:50 AM
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
## 2018-03-23 NOTE — Plan of Care (Signed)
  Problem: Activity: Goal: Interest or engagement in activities will improve Outcome: Progressing   Problem: Safety: Goal: Periods of time without injury will increase Outcome: Progressing  DAR NOTE: Patient presents with anxious affect and depressed mood.  Denies pain, auditory and visual hallucinations.  Rates depression at 4, hopelessness at 0, and anxiety at 0.  Reports symptoms of cravings, chilling, cramping, nausea, agitation and irritability on self inventory form.  Maintained on routine safety checks.  Medications given as prescribed.  Support and encouragement offered as needed.  Attended group and participated.  States goal for today is "remembering to breath and walk away."  Patient observed socializing with peers in the dayroom.

## 2018-03-23 NOTE — [facility] Group Notes (Signed)
Adult Psychoeducational Group Note  Date:  03/23/2018 Time:  9:31 PM  Group Topic/Focus:  Wrap-Up Group:   The focus of this group is to help patients review their daily goal of treatment and discuss progress on daily workbooks.  Participation Level:  Active  Participation Quality:  Appropriate and Attentive  Affect:  Appropriate  Cognitive:  Alert and Appropriate  Insight: Appropriate and Good  Engagement in Group:  Engaged  Modes of Intervention:  Discussion and Education  Additional Comments:  Pt attended and participated in wrap up group this evening. Pt had an "amazing" day, due to them going home next week. Pt received their Flu shot and their Pneumonia shot today. Pt on going goal is to set up a family visit with their son and daughter.   Chrisandra Netters 03/23/2018, 9:31 PM

## 2018-03-24 MED ORDER — OXCARBAZEPINE 150 MG PO TABS
150.0000 mg | ORAL_TABLET | Freq: Once | ORAL | Status: AC
  Administered 2018-03-24: 150 mg via ORAL
  Filled 2018-03-24 (×2): qty 1

## 2018-03-24 MED ORDER — OXCARBAZEPINE 300 MG PO TABS
800.0000 mg | ORAL_TABLET | Freq: Every day | ORAL | Status: DC
  Administered 2018-03-24 – 2018-03-25 (×2): 825 mg via ORAL
  Filled 2018-03-24 (×4): qty 1.5

## 2018-03-24 NOTE — Progress Notes (Signed)
Adult Psychoeducational Group Note  Date:  03/24/2018 Time:  8:48 PM  Group Topic/Focus:  Wrap-Up Group:   The focus of this group is to help patients review their daily goal of treatment and discuss progress on daily workbooks.  Participation Level:  Active  Participation Quality:  Appropriate  Affect:  Appropriate  Cognitive:  Appropriate  Insight: Appropriate  Engagement in Group:  Engaged  Modes of Intervention:  Discussion  Additional Comments: The patient expressed  That she attended groups.The patient also said that her goal was to work on anger.  Octavio Manns 03/24/2018, 8:48 PM

## 2018-03-24 NOTE — Progress Notes (Signed)
Patient ID: Kelly Carr, female   DOB: [birth year], 47 y.o.   MRN: [id number]  D: Pt irritable earlier in the shift, was focused on getting Robitussin even though she has not been observed to be coughing even once during the shift, and has no signs or symptoms of nasal congestion.  Dr. Jola Babinski was notified.  Pt denies SI/HI/AVH, states that she is looking forward to being discharged.  Pt with labile mood, has flight of ideas, goes from being happy and talking to staff and making jokes, to crying and has flight of ideas.  A: A: Pt being maintained on Q 15 minute checks for safety, all meds being given as ordered.  R: Will continue to monitor on Q15 minute safety checks.

## 2018-03-24 NOTE — [facility] Group Notes (Signed)
  [facility] LCSW Group Therapy Note  Date/Time:  03/24/2018 11:15AM-12:00PM  Type of Therapy and Topic:  Group Therapy:  Feelings About Hospitalization  Participation Level:  Active   Description of Group This process group involved patients discussing their feelings related to being hospitalized, as well as the benefits they see to being in the hospital.  These feelings and benefits were itemized.  The group then brainstormed specific ways in which they could seek those same benefits when they discharge and return home.  Therapeutic Goals 1. Patient will identify and describe positive and negative feelings related to hospitalization 2. Patient will verbalize benefits of hospitalization to themselves personally 3. Patients will brainstorm together ways they can obtain similar benefits in the outpatient setting, identify barriers to wellness and possible solutions  Summary of Patient Progress:  The patient expressed her primary feelings about being hospitalized are that she is angry with someone, but not with being hospitalized.  She feels she needs it and is "loving it."  She wants to have her meds changed while she is here.  Right now she "hates" her family.  She was somewhat monopolizing but this was not a problem in this group due to the silence and lack of participation from others.  Therapeutic Modalities Cognitive Behavioral Therapy Motivational Interviewing    Ambrose Mantle, LCSW 03/24/2018, 8:44 AM

## 2018-03-24 NOTE — Progress Notes (Signed)
Pt has poor attitude, rude behavior and is confrontational.  Pt blames staff for any perceived slight and is very labile. Pt is demanding of staff time and will ask for one thing over several hours, returning to nurses station for food, additional meds,. Drinks, gum and anything she believes that she is entitled too.

## 2018-03-24 NOTE — Progress Notes (Signed)
Pt is needy, demanding and rude.  Pt comes to nurses station every 5 or 10 min asking for something different.  Pt given 7a meds, leaves and returns wanting something for anxiety, leaves and returns wanting nicorette gum, leaves and returns to complain about not getting any cough medicine.  Pt received throat lozenger.  Pt argumentative and stating this writer told her she wanted the whole bottle and was offended when offer information on side effects, "I know all the side effects, you think I'm stupid?  I trained as a Engineer, site and know all my meds." Patent pacing the halls rapidly and talking to herself.
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
## 2018-03-25 MED ORDER — ARIPIPRAZOLE 10 MG PO TABS
20.0000 mg | ORAL_TABLET | Freq: Every day | ORAL | Status: DC
  Administered 2018-03-26: 20 mg via ORAL
  Filled 2018-03-25 (×3): qty 2

## 2018-03-25 MED ORDER — ARIPIPRAZOLE 5 MG PO TABS
5.0000 mg | ORAL_TABLET | Freq: Once | ORAL | Status: AC
  Administered 2018-03-25: 5 mg via ORAL
  Filled 2018-03-25 (×2): qty 1

## 2018-03-25 NOTE — Progress Notes (Signed)
D: Pt denies SI/HI/AVH. Pt is pleasant and cooperative. Pt stated she was feeling better, pt continues to blame her family for her issues. " This is the best I've ever felt, I'm frustrated with my family, My meds doing great" Pt visible on the unit, anxious at times A: Pt was offered support and encouragement. Pt was given scheduled medications. Pt was encourage to attend groups. Q 15 minute checks were done for safety.  R:Pt attends groups and interacts well with peers and staff. Pt is taking medication. Pt has no complaints.Pt receptive to treatment and safety maintained on unit.  Problem: Education: Goal: Emotional status will improve Outcome: Progressing Note:  Pt stated she was feeling better   Problem: Coping: Goal: Ability to demonstrate self-control will improve Outcome: Progressing Note:  Pt stated she had an issue with another pt and she walked away   Problem: Safety: Goal: Periods of time without injury will increase Outcome: Progressing Note:  Pt been safe on the unit at this time

## 2018-03-25 NOTE — Progress Notes (Signed)
Adult Psychoeducational Group Note  Date:  03/25/2018 Time:  8:53 PM  Group Topic/Focus:  Wrap-Up Group:   The focus of this group is to help patients review their daily goal of treatment and discuss progress on daily workbooks.  Participation Level:  Active  Participation Quality:  Appropriate  Affect:  Appropriate  Cognitive:  Appropriate  Insight: Appropriate  Engagement in Group:  Engaged  Modes of Intervention:  Discussion  Additional Comments:  The patient expressed that she rates today a 10.The patient also said that she attended groups and preparing for discharge.  Octavio Manns 03/25/2018, 8:53 PM

## 2018-03-25 NOTE — Progress Notes (Signed)
Patient ID: Kelly Carr, female   DOB: 1970-10-07, 47 y.o.   MRN: [id number]  D: Pt calm and cooperative, denies SI/HI/AVH, and is visible in the day room interacting with her peers and staff.  Pt reports that the sleep quality she had last night was the best that she has had since being admitted on unit.  Pt reports a good appetite, a good concentration level, denies feelings of depression, anxiety and hopelessness.  Pt reports that what is important for her today is "staying positive and focused on discharge", and "breathe and walk away when I need to".  A: Pt being monitored on Q15 minute checks for safety, all meds being given as ordered.    R: Will continue to monitor and will address any concerns as they may arise.
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
## 2018-03-25 NOTE — BHH Group Notes (Signed)
[facility] LCSW Group Therapy Note  Date/Time:  03/25/2018  11:00AM-12:00PM  Type of Therapy and Topic:  Group Therapy:  Music and Mood  Participation Level:  Active   Description of Group: In this process group, members listened to a variety of genres of music and identified that different types of music evoke different responses.  Patients were encouraged to identify music that was soothing for them and music that was energizing for them.  Patients discussed how this knowledge can help with wellness and recovery in various ways including managing depression and anxiety as well as encouraging healthy sleep habits.    Therapeutic Goals: 1. Patients will explore the impact of different varieties of music on mood 2. Patients will verbalize the thoughts they have when listening to different types of music 3. Patients will identify music that is soothing to them as well as music that is energizing to them 4. Patients will discuss how to use this knowledge to assist in maintaining wellness and recovery 5. Patients will explore the use of music as a coping skill  Summary of Patient Progress:  At the beginning of group, patient expressed that she felt "chill" because she slept well last night.  At the end of group, during which she was very interactive and only rarely had to be asked to stop talking, she said she felt "great."  Therapeutic Modalities: Solution Focused Brief Therapy Activity   Ambrose Mantle, LCSW

## 2018-03-26 ENCOUNTER — Other Ambulatory Visit: Payer: Self-pay | Admitting: Physician Assistant

## 2018-03-26 MED ORDER — SERTRALINE HCL 50 MG PO TABS: 50.0000 mg | ORAL_TABLET | Freq: Every day | ORAL | 0 refills | Status: DC

## 2018-03-26 MED ORDER — TRAZODONE HCL 50 MG PO TABS: 50.0000 mg | ORAL_TABLET | Freq: Every evening | ORAL | 0 refills | Status: DC | PRN

## 2018-03-26 MED ORDER — ARIPIPRAZOLE ER 400 MG IM SRER: 400.0000 mg | INTRAMUSCULAR | 0 refills | Status: DC

## 2018-03-26 MED ORDER — PRAZOSIN HCL 2 MG PO CAPS: 2.0000 mg | ORAL_CAPSULE | Freq: Every day | ORAL | 0 refills | Status: DC

## 2018-03-26 MED ORDER — OXCARBAZEPINE 300 MG PO TABS: 800.0000 mg | ORAL_TABLET | Freq: Every day | ORAL | 1 refills | Status: DC

## 2018-03-26 MED ORDER — ARIPIPRAZOLE 20 MG PO TABS: 20.0000 mg | ORAL_TABLET | Freq: Every day | ORAL | 0 refills | Status: DC

## 2018-03-26 MED ORDER — OXCARBAZEPINE 300 MG PO TABS: 300.0000 mg | ORAL_TABLET | Freq: Every day | ORAL | 0 refills | Status: DC

## 2018-03-26 MED ORDER — BUPROPION HCL 75 MG PO TABS: 37.5000 mg | ORAL_TABLET | ORAL | 0 refills | Status: DC

## 2018-03-26 MED ORDER — ARIPIPRAZOLE ER 400 MG IM SRER
400.0000 mg | INTRAMUSCULAR | Status: DC
  Administered 2018-03-26: 400 mg via INTRAMUSCULAR

## 2018-03-26 MED ORDER — HYDROXYZINE HCL 50 MG PO TABS: 50.0000 mg | ORAL_TABLET | Freq: Four times a day (QID) | ORAL | 0 refills | Status: DC | PRN

## 2018-03-26 MED ORDER — PANTOPRAZOLE SODIUM 40 MG PO TBEC: 40.0000 mg | DELAYED_RELEASE_TABLET | Freq: Every day | ORAL | 0 refills | Status: DC

## 2018-03-26 NOTE — Plan of Care (Signed)
Pt was able to identify coping skills at completion of recreation therapy group sessions.   Merin Borjon, LRT/CTRS 

## 2018-03-26 NOTE — Progress Notes (Signed)
  [facility] Adult Case Management Discharge Plan :  Will you be returning to the same living situation after discharge:  Yes,  own home At discharge, do you have transportation home?: No. Bus pass provided. Do you have the ability to pay for your medications: Yes,  BCBS  Release of information consent forms completed and in the chart;  Patient's signature needed at discharge.  Patient to Follow up at: Follow-up Information    [facility]. Go on 03/29/2018.   Specialty:  Behavioral Health Why:  Please attend your therapy appt with Rockne Menghini on Thursday, 03/29/18, at 9:00am.  Your next medication appt with Melony Overly is Thursday, 05/10/18, at 10:30am.  You are on the wait list for a sooner appt.  Please call the office. Contact information: [address] [place] [address] [phone]          Next level of care provider has access to [facility] Link:no  Safety Planning and Suicide Prevention discussed: No. NA  Have you used any form of tobacco in the last 30 days? (Cigarettes, Smokeless Tobacco, Cigars, and/or Pipes): Yes  Has patient been referred to the Quitline?: Patient refused referral  Patient has been referred for addiction treatment: Pt. refused referral  Lorri Frederick, LCSW 03/26/2018, 10:52 AM

## 2018-03-26 NOTE — Progress Notes (Signed)
Recreation Therapy Notes  INPATIENT RECREATION TR PLAN  Patient Details Name: Katerine Ergle MRN: [id number] DOB: 1971/01/12 Today's Date: 03/26/2018  Rec Therapy Plan Is patient appropriate for Therapeutic Recreation?: Yes Treatment times per week: about 3 days Estimated Length of Stay: 5-7 days TR Treatment/Interventions: Group participation (Comment)  Discharge Criteria Pt will be discharged from therapy if:: Discharged Treatment plan/goals/alternatives discussed and agreed upon by:: Patient/family  Discharge Summary Short term goals set: See patient care plan. Short term goals met: Complete Progress toward goals comments: Groups attended Which groups?: Coping skills, Stress management, Other (Comment)(Team building) Reason goals not met: None Therapeutic equipment acquired: N/A Reason patient discharged from therapy: Discharge from hospital Pt/family agrees with progress & goals achieved: Yes Date patient discharged from therapy: 03/26/18    Victorino Sparrow, LRT/CTRS  Ria Comment, Unnamed Zeien A 03/26/2018, 10:57 AM
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
## 2018-03-26 NOTE — Progress Notes (Signed)
Patient discharged to lobby. Patient was stable and appreciative at that time. All papers and prescriptions were given and valuables returned. Verbal understanding expressed. Denies SI/HI and A/VH. Patient given opportunity to express concerns and ask questions.  

## 2018-03-26 NOTE — Progress Notes (Addendum)
Recreation Therapy Notes  Date: 10.21.19 Time: 1000 Location: 500 Hall Dayroom  Group Topic: Coping Skills  Goal Area(s) Addresses:  Patient will be able to identify positive coping skills. Patient will be able to identify benefit of using coping skills post d/c.  Behavioral Response: Engaged  Intervention: Worksheet, pencils, white board, marker, eraser  Activity: Mind map.  LRT and patients filled in the first 8 boxes together with things (anger, death/loss, break up, family, anxiety, depression, fear and illness) that would require the use of coping skills.  Education: Pharmacologist, Building control surveyor.   Education Outcome: Acknowledges understanding/In group clarification offered/Needs additional education.   Clinical Observations/Feedback: Pt was bright, engaged and appropriate during group.  Pt was excited to be discharging.  Pt identified some of her coping skills as cleaning, running, listening to music, prayer, meditation, cry, mingle, take space, cut them off, medication, reach out, yoga and seeing the doctor.     Rajiv Parlato Linsay, LRT/CTRS     Caroll Rancher A 03/26/2018 11:47 AM

## 2018-03-26 NOTE — [facility] Suicide Risk Assessment (Signed)
[facility] Discharge Suicide Risk Assessment   Principal Problem: Severe bipolar I disorder, current or most recent episode mixed [facility]) Discharge Diagnoses:  Patient Active Problem List   Diagnosis Date Noted  . PTSD (post-traumatic stress disorder) [F43.10] 03/21/2018  . Constipation [K59.00] 04/14/2017  . Tendinopathy of right gluteus medius [M67.98] 04/05/2017  . Productive cough [R05] 03/06/2017  . Major depressive disorder, recurrent severe without psychotic features (HCC) [F33.2] 02/17/2017  . Manic behavior (HCC) [F30.10]   . Sprain of left ring finger [S63.615A] 02/15/2017  . Chest pain [R07.9] 11/23/2016  . Ingrown toenail without infection [L60.0] 09/03/2016  . Right shoulder pain [M25.511] 03/08/2016  . Piriformis syndrome of right side [G57.01] 02/23/2016  . Right shoulder injury [S49.91XA] 02/17/2016  . HLD (hyperlipidemia) [E78.5] 11/12/2015  . Dysuria [R30.0] 05/26/2015  . Normocytic anemia [D64.9] 05/26/2015  . Pain of upper abdomen [R10.10] 05/04/2015  . Low grade squamous intraepithelial lesion (LGSIL) on cervical Pap smear [R87.612] 03/04/2015  . Labral tear of hip joint [S73.199A] 12/27/2014  . Pharyngitis, chronic [J31.2] 08/11/2014  . Irregular menses [N92.6] 07/17/2013  . GERD (gastroesophageal reflux disease) [K21.9] 11/10/2010  . Carpal tunnel syndrome of left wrist [G56.02] 11/10/2010  . Severe bipolar I disorder, current or most recent episode mixed (HCC) [F31.63] 11/25/2008  . OTH ABNORMAL PAPANICOLAOU SMEAR CERVIX&CERV HPV [R87.89] 11/25/2008  . DEPRESSION [F32.9] 10/21/2008    Total Time spent with patient: 30 minutes    Psychiatric Specialty Exam: Review of Systems  Constitutional: Negative for chills and fever.  Respiratory: Negative for cough and shortness of breath.   Cardiovascular: Negative for chest pain.  Gastrointestinal: Negative for abdominal pain, heartburn, nausea and vomiting.  Psychiatric/Behavioral: Negative for depression,  hallucinations and suicidal ideas. The patient is not nervous/anxious and does not have insomnia.     Blood pressure 118/80, pulse (!) 106, temperature 98.1 F (36.7 C), temperature source Oral, resp. rate 18, height 5\' 4"  (1.626 m), weight 60.8 kg, last menstrual period 02/27/2018.Body mass index is 23 kg/m.   Mental Status Per Nursing Assessment::   On Admission:  NA  Demographic Factors:  Adolescent or young adult, Divorced or widowed, Low socioeconomic status, Living alone and Unemployed  Loss Factors: Loss of significant relationship and Financial problems/change in socioeconomic status  Historical Factors: Impulsivity  Risk Reduction Factors:   Sense of responsibility to family, Positive social support, Positive therapeutic relationship and Positive coping skills or problem solving skills  Continued Clinical Symptoms:  Bipolar Disorder:   Mixed State  Cognitive Features That Contribute To Risk:  None    Suicide Risk:  Minimal: No identifiable suicidal ideation.  Patients presenting with no risk factors but with morbid ruminations; may be classified as minimal risk based on the severity of the depressive symptoms  Follow-up Information    [facility]. Go on 03/29/2018.   Specialty:  Behavioral Health Why:  Please attend your therapy appt with Rockne Menghini on Thursday, 03/29/18, at 9:00am.  Your next medication appt with Melony Overly is Thursday, 05/10/18, at 10:30am.  You are on the wait list for a sooner appt.  Please call the office. Contact information: [address] [place] [address] [phone]          Plan Of Care/Follow-up recommendations:  Activity:  as tolerated Diet:  normal Tests:  NA Other:  see above for DC plan  Micheal Likens, MD 03/26/2018, 11:12 AM

## 2018-03-26 NOTE — [facility] Group Notes (Signed)
[facility] LCSW Group Therapy Note  Date/Time: 03/26/18, 1315  Type of Therapy and Topic:  Group Therapy:  Overcoming Obstacles  Participation Level:  active  Description of Group:    In this group patients will be encouraged to explore what they see as obstacles to their own wellness and recovery. They will be guided to discuss their thoughts, feelings, and behaviors related to these obstacles. The group will process together ways to cope with barriers, with attention given to specific choices patients can make. Each patient will be challenged to identify changes they are motivated to make in order to overcome their obstacles. This group will be process-oriented, with patients participating in exploration of their own experiences as well as giving and receiving support and challenge from other group members.  Therapeutic Goals: 1. Patient will identify personal and current obstacles as they relate to admission. 2. Patient will identify barriers that currently interfere with their wellness or overcoming obstacles.  3. Patient will identify feelings, thought process and behaviors related to these barriers. 4. Patient will identify two changes they are willing to make to overcome these obstacles:    Summary of Patient Progress: Pt identified family and lack of support as obstacles in her life.  Pt very talkative in describing how she is going to have to "live her life" in spite of the conflicts that she has, particularly with her children.      Therapeutic Modalities:   Cognitive Behavioral Therapy Solution Focused Therapy Motivational Interviewing Relapse Prevention Therapy  Daleen Squibb, LCSW

## 2018-03-28 ENCOUNTER — Other Ambulatory Visit: Payer: Self-pay

## 2018-03-28 ENCOUNTER — Other Ambulatory Visit (HOSPITAL_BASED_OUTPATIENT_CLINIC_OR_DEPARTMENT_OTHER): Payer: Self-pay

## 2018-03-28 ENCOUNTER — Emergency Department (HOSPITAL_COMMUNITY)
Admission: EM | Admit: 2018-03-28 | Discharge: 2018-03-28 | Disposition: A | Discharge status: Home / Self Care | Payer: BLUE CROSS/BLUE SHIELD | Attending: Emergency Medicine | Admitting: Emergency Medicine

## 2018-03-28 ENCOUNTER — Emergency Department (HOSPITAL_COMMUNITY): Payer: BLUE CROSS/BLUE SHIELD

## 2018-03-28 ENCOUNTER — Encounter (HOSPITAL_COMMUNITY): Payer: Self-pay | Admitting: Emergency Medicine

## 2018-03-28 DIAGNOSIS — R002 Palpitations: Secondary | ICD-10-CM | POA: Insufficient documentation

## 2018-03-28 DIAGNOSIS — F17210 Nicotine dependence, cigarettes, uncomplicated: Secondary | ICD-10-CM | POA: Insufficient documentation

## 2018-03-28 DIAGNOSIS — Z79899 Other long term (current) drug therapy: Secondary | ICD-10-CM | POA: Insufficient documentation

## 2018-03-28 LAB — CBC WITH DIFFERENTIAL/PLATELET
Abs Immature Granulocytes: 0.04 10*3/uL (ref 0.00–0.07)
Basophils Absolute: 0 10*3/uL (ref 0.0–0.1)
Basophils Relative: 0 %
Eosinophils Absolute: 0.2 10*3/uL (ref 0.0–0.5)
Eosinophils Relative: 2 %
HCT: 35.9 % — ABNORMAL LOW (ref 36.0–46.0)
Hemoglobin: 11.7 g/dL — ABNORMAL LOW (ref 12.0–15.0)
Immature Granulocytes: 1 %
Lymphocytes Relative: 25 %
Lymphs Abs: 2.2 10*3/uL (ref 0.7–4.0)
MCH: 27.7 pg (ref 26.0–34.0)
MCHC: 32.6 g/dL (ref 30.0–36.0)
MCV: 85.1 fL (ref 80.0–100.0)
Monocytes Absolute: 1 10*3/uL (ref 0.1–1.0)
Monocytes Relative: 11 %
Neutro Abs: 5.4 10*3/uL (ref 1.7–7.7)
Neutrophils Relative %: 61 %
Platelets: 315 10*3/uL (ref 150–400)
RBC: 4.22 MIL/uL (ref 3.87–5.11)
RDW: 12.6 % (ref 11.5–15.5)
WBC: 8.8 10*3/uL (ref 4.0–10.5)
nRBC: 0 % (ref 0.0–0.2)

## 2018-03-28 LAB — BASIC METABOLIC PANEL
Anion gap: 10 (ref 5–15)
BUN: 14 mg/dL (ref 6–20)
CO2: 22 mmol/L (ref 22–32)
Calcium: 8.9 mg/dL (ref 8.9–10.3)
Chloride: 100 mmol/L (ref 98–111)
Creatinine, Ser: 0.68 mg/dL (ref 0.44–1.00)
GFR calc Af Amer: >60 mL/min (ref >60)
GFR calc non Af Amer: >60 mL/min (ref >60)
Glucose, Bld: 117 mg/dL — ABNORMAL HIGH (ref 70–99)
Potassium: 3.6 mmol/L (ref 3.5–5.1)
Sodium: 132 mmol/L — ABNORMAL LOW (ref 135–145)

## 2018-03-28 LAB — D-DIMER, QUANTITATIVE: D-Dimer, Quant: <0.27 ug/mL-FEU (ref 0.00–0.50)

## 2018-03-28 LAB — TROPONIN I: Troponin I: <0.03 ng/mL (ref <0.03)

## 2018-03-28 NOTE — ED Provider Notes (Signed)
[facility] EMERGENCY DEPARTMENT Provider Note   CSN: [id number] Arrival date & time: 03/28/18  0128     History   Chief Complaint Chief Complaint  Patient presents with  . Tachycardia    HPI Kelly Carr is a 47 y.o. female.  Patient presents to the emergency department with a chief complaint of palpitations.  She states that she has anxiety and is normally high strung.  States that she had some coffee and some Robitussin tonight.  Feels like this caused her to have some palpitations.  She has been also dealing with a lot of family trauma.  She was recently admitted to behavioral health for her bipolar disorder.  She states that she has had some dry cough.  She states that she feels significantly better now, and thinks that her symptoms were brought on as a result of thinking about her life stressors and the cough and Robitussin tonight.  She denies any fevers chills.  Denies any history of PE or DVT.  She denies any recent immobilization or surgery.  Denies any other associated symptoms.  The history is provided by the patient. No language interpreter was used.    Past Medical History:  Diagnosis Date  . Abnormal Pap smear    Unknown results>colpo>normal  . Anxiety   . Arthritis   . Bipolar 1 disorder [facility])     Patient Active Problem List   Diagnosis Date Noted  . PTSD (post-traumatic stress disorder) 03/21/2018  . Constipation 04/14/2017  . Tendinopathy of right gluteus medius 04/05/2017  . Productive cough 03/06/2017  . 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 Manic behavior (HCC)   . Sprain of left ring finger 02/15/2017  . Chest pain 11/23/2016  . Ingrown toenail without infection 09/03/2016  . Right shoulder pain 03/08/2016  . Piriformis syndrome of right side 02/23/2016  . Right shoulder injury 02/17/2016  . HLD (hyperlipidemia) 11/12/2015  . Dysuria 05/26/2015  . Normocytic anemia  05/26/2015  . Pain of upper abdomen 05/04/2015  . 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 Labral tear of hip joint 12/27/2014  . Pharyngitis, chronic 08/11/2014  . Irregular menses 07/17/2013  . GERD (gastroesophageal reflux disease) 11/10/2010  . Carpal tunnel syndrome of left wrist 11/10/2010  . Severe bipolar I disorder, current or most recent episode mixed (HCC) 11/25/2008  . OTH ABNORMAL PAPANICOLAOU SMEAR CERVIX&CERV HPV 11/25/2008  . DEPRESSION 10/21/2008    History reviewed. No pertinent surgical history.   OB History    Gravida  4   Para  2   Term  2   Preterm      AB  2   Living  2     SAB  2   TAB      Ectopic      Multiple      Live Births  2            Home Medications    Prior to Admission medications   Medication Sig Start Date End Date Taking? Authorizing Provider  ARIPiprazole (ABILIFY) 20 MG tablet Take 1 tablet (20 mg total) by mouth daily. 03/27/18  Yes Micheal Likens, MD  ARIPiprazole ER (ABILIFY MAINTENA) 400 MG SRER injection Inject 2 mLs (400 mg total) into the muscle every 28 (twenty-eight) days. 03/26/18  Yes Micheal Likens, MD  buPROPion (WELLBUTRIN) 75 MG tablet Take 0.5 tablets (37.5 mg total) by mouth every morning. Patient  taking differently: Take 37.5 mg by mouth daily.  03/27/18  Yes [place], Burlene Arnt, MD  Ginkgo Biloba (GNP GINGKO BILOBA EXTRACT PO) Take 1 tablet by mouth daily.   Yes [provider]  hydrOXYzine (ATARAX/VISTARIL) 50 MG tablet Take 1 tablet (50 mg total) by mouth every 6 (six) hours as needed for anxiety. 03/26/18  Yes Micheal Likens, MD  norgestimate-ethinyl estradiol (ORTHO-CYCLEN,SPRINTEC,PREVIFEM) 0.25-35 MG-MCG tablet Take 1 tablet by mouth daily. 08/21/17  Yes Bardwell Bing, MD  Oxcarbazepine (TRILEPTAL) 300 MG tablet Take 1 tablet (300 mg total) by mouth daily. 03/27/18  Yes Micheal Likens, MD    OXcarbazepine (TRILEPTAL) 300 MG tablet Take 2.5 tablets (750 mg total) by mouth at bedtime. 03/26/18  Yes Micheal Likens, MD  pantoprazole (PROTONIX) 40 MG tablet Take 1 tablet (40 mg total) by mouth daily. 03/27/18  Yes [place], Burlene Arnt, MD  prazosin (MINIPRESS) 2 MG capsule Take 1 capsule (2 mg total) by mouth at bedtime. 03/26/18  Yes Micheal Likens, MD  sertraline (ZOLOFT) 50 MG tablet Take 1 tablet (50 mg total) by mouth daily. 03/27/18  Yes Micheal Likens, MD  traZODone (DESYREL) 50 MG tablet Take 1 tablet (50 mg total) by mouth at bedtime as needed and may repeat dose one time if needed for sleep. 03/26/18  Yes Micheal Likens, MD    Family History Family History  Problem Relation Age of Onset  . Diabetes Mother   . Hypertension Mother   . Heart disease Mother 10  . Diabetes Maternal Grandmother   . Heart disease Maternal Grandmother   . Diabetes Maternal Grandfather   . Heart disease Maternal Grandfather   . Breast cancer Neg Hx     Social History Social History   Tobacco Use  . Smoking status: Current Every Day Smoker    Packs/day: 0.50    Types: Cigarettes  . Smokeless tobacco: Never Used  Substance Use Topics  . Alcohol use: Yes    Comment: drinks wine occasionally  . Drug use: No     Allergies   Haldol [haloperidol lactate]; Risperidone and related; Tramadol; and Valproic acid   Review of Systems Review of Systems  All other systems reviewed and are negative.    Physical Exam Updated Vital Signs BP 127/69 (BP Location: Left Arm)   Pulse (!) 109   Temp 97.7 F (36.5 C) (Oral)   Resp 17   LMP 02/27/2018 (Approximate)   SpO2 98%   Physical Exam  Constitutional: She is oriented to person, place, and time. She appears well-developed and well-nourished.  HENT:  Head: Normocephalic and atraumatic.  Eyes: Pupils are equal, round, and reactive to light. Conjunctivae and EOM are normal.  Neck: Normal range  of motion. Neck supple.  Cardiovascular: Normal rate and regular rhythm. Exam reveals no gallop and no friction rub.  No murmur heard. Pulmonary/Chest: Effort normal and breath sounds normal. No respiratory distress. She has no wheezes. She has no rales. She exhibits no tenderness.  Abdominal: Soft. Bowel sounds are normal. She exhibits no distension and no mass. There is no tenderness. There is no rebound and no guarding.  Musculoskeletal: Normal range of motion. She exhibits no edema or tenderness.  Neurological: She is alert and oriented to person, place, and time.  Skin: Skin is warm and dry.  Psychiatric: She has a normal mood and affect. Her behavior is normal. Judgment and thought content normal.  Nursing note and vitals reviewed.    ED Treatments /  Results  Labs (all labs ordered are listed, but only abnormal results are displayed) Labs Reviewed  CBC WITH DIFFERENTIAL/PLATELET - Abnormal; Notable for the following components:      Result Value   Hemoglobin 11.7 (*)    HCT 35.9 (*)    All other components within normal limits  BASIC METABOLIC PANEL - Abnormal; Notable for the following components:   Sodium 132 (*)    Glucose, Bld 117 (*)    All other components within normal limits  TROPONIN I  D-DIMER, QUANTITATIVE (NOT AT [facility])    EKG None ED ECG REPORT  I personally interpreted this EKG   Date: 03/28/2018   Rate: 93  Rhythm: normal sinus rhythm  QRS Axis: normal  Intervals: normal  ST/T Wave abnormalities: normal  Conduction Disutrbances:none  Narrative Interpretation:   Old EKG Reviewed: none available   Radiology Dg Chest 2 View  Result Date: 03/28/2018 CLINICAL DATA:  Shortness of breath and tachycardia EXAM: CHEST - 2 VIEW COMPARISON:  12/11/2016 FINDINGS: The heart size and mediastinal contours are within normal limits. Both lungs are clear. The visualized skeletal structures are unremarkable. IMPRESSION: No active cardiopulmonary disease.  Electronically Signed   By: Deatra Robinson M.D.   On: 03/28/2018 02:10    Procedures Procedures (including critical care time)  Medications Ordered in ED Medications - No data to display   Initial Impression / Assessment and Plan / ED Course  I have reviewed the triage vital signs and the nursing notes.  Pertinent labs & imaging results that were available during my care of the patient were reviewed by me and considered in my medical decision making (see chart for details).    Patient with palpitations that started tonight.  She has a history of anxiety and bipolar.  Thinks that she was worked up because of ongoing family issues.  She states that she just wanted to be evaluated to make certain everything was okay.  Patient reports that she is feeling better now.  Chest x-ray is negative.  Troponin negative.  EKG shows no ischemic changes.  No concerning arrhythmias.  Patient is in no acute distress on exam.  Given reassuring work-up, and asymptomatic now, we will discharged home with PCP follow-up.  Final Clinical Impressions(s) / ED Diagnoses   Final diagnoses:  Palpitations    ED Discharge Orders    None       Roxy Horseman, PA-C 03/28/18 0331    Glynn Octave, MD 03/28/18 [phone]

## 2018-03-28 NOTE — ED Triage Notes (Signed)
Pt c/o tachycardia, and shortness of breath. States she discharged from [facility]. C/o "a lot" of stress at home. Denies chest pain.

## 2018-03-28 NOTE — ED Notes (Signed)
Patient verbalizes understanding of discharge instructions. Opportunity for questioning and answers were provided. Armband removed by staff, pt discharged from ED ambulatory.   

## 2018-03-29 ENCOUNTER — Ambulatory Visit: Payer: BLUE CROSS/BLUE SHIELD | Admitting: Psychiatry

## 2018-04-05 ENCOUNTER — Ambulatory Visit: Payer: BLUE CROSS/BLUE SHIELD | Admitting: Family Medicine

## 2018-04-05 ENCOUNTER — Ambulatory Visit: Payer: BLUE CROSS/BLUE SHIELD | Admitting: Psychiatry

## 2018-04-05 ENCOUNTER — Other Ambulatory Visit: Payer: Self-pay | Admitting: Physician Assistant

## 2018-04-05 ENCOUNTER — Ambulatory Visit (INDEPENDENT_AMBULATORY_CARE_PROVIDER_SITE_OTHER): Payer: BLUE CROSS/BLUE SHIELD | Admitting: Psychiatry

## 2018-04-05 DIAGNOSIS — F319 Bipolar disorder, unspecified: Secondary | ICD-10-CM

## 2018-04-05 MED ORDER — PRAZOSIN HCL 2 MG PO CAPS: 2.0000 mg | ORAL_CAPSULE | Freq: Every day | ORAL | 1 refills | Status: DC

## 2018-04-05 NOTE — Progress Notes (Signed)
Kelly Carr - 47 y.o. female MRN [id number]  Date of birth: [birth year]  SUBJECTIVE:  Including CC & ROS.  Chief Complaint  Patient presents with  . Follow-up    R hip pain    Kelly Carr is a 47 y.o. female that is presenting for f/u of her R hip pain.  Pt last saw Dr. Jordan Likes on 05/26/17.  Pt states that her hip has been bothering her for a while and is having radiating pain into the R thigh that sometimes will extend into her R lower leg as well.  She reports having fallen a few times since her last visit.  She states that she never went to PT but would like another script.  She states that she has been taking Tylenol Arthritis.  She states that she never ended up getting or using the Pennsaid and is no longer taking the Flexeril.    Reports having carpal tunnel like symptoms in both hands.  She was diagnosed with a several years ago.  She has tried splints in the past.  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  Feels the symptoms in the palmar aspect of the first 3 digits.  The symptoms are acute on chronic in nature.  Denies any inciting event or mechanism of injury.  Symptoms are intermediate in severity and intermittent.  She states that she is also having issues w/ her B wrists and is interested in who she could see for possible carpal tunnel.  Felipa Emory, ATC, have served as Stage manager for Dr. Jordan Likes today.   Review of Systems  Constitutional: Negative for fever.  HENT: Negative for congestion.   Respiratory: Negative for cough.   Cardiovascular: Negative for chest pain.  Gastrointestinal: Negative for abdominal pain.  Musculoskeletal: Negative for back pain.  Skin: Negative for color change.  Neurological: Negative for weakness.  Hematological: Negative for adenopathy.  Psychiatric/Behavioral: Negative for agitation.    HISTORY: Past Medical, Surgical, Social, and Family History Reviewed & Updated per EMR.   Pertinent Historical Findings include:  Past Medical  History:  Diagnosis Date  . Abnormal Pap smear    Unknown results>colpo>normal  . Anxiety   . Arthritis   . Bipolar 1 disorder (HCC)     No past surgical history on file.  Allergies  Allergen Reactions  . Haldol [Haloperidol Lactate] Other (See Comments)    Syncope   . Risperidone And Related Other (See Comments)    Zones out  . Tramadol Itching  . Valproic Acid     Family History  Problem Relation Age of Onset  . Diabetes Mother   . Hypertension Mother   . Heart disease Mother 27  . Diabetes Maternal Grandmother   . Heart disease Maternal Grandmother   . Diabetes Maternal Grandfather   . Heart disease Maternal Grandfather   . Breast cancer Neg Hx      Social History   Socioeconomic History  . Marital status: Single    Spouse name: Not on file  . Number of children: Not on file  . Years of education: Not on file  . Highest education level: Not on file  Occupational History  . Not on file  Social Needs  . Financial resource strain: Not on file  . Food insecurity:    Worry: Not on file    Inability: Not on file  . Transportation needs:    Medical: Not on file    Non-medical: Not on file  Tobacco Use  .  Smoking status: Current Every Day Smoker    Packs/day: 0.50    Types: Cigarettes  . Smokeless tobacco: Never Used  Substance and Sexual Activity  . Alcohol use: Yes    Comment: drinks wine occasionally  . Drug use: No  . Sexual activity: Yes    Partners: Male    Birth control/protection: Pill  Lifestyle  . Physical activity:    Days per week: Not on file    Minutes per session: Not on file  . Stress: Not on file  Relationships  . Social connections:    Talks on phone: Not on file    Gets together: Not on file    Attends religious service: Not on file    Active member of club or organization: Not on file    Attends meetings of clubs or organizations: Not on file    Relationship status: Not on file  . Intimate partner violence:    Fear of  current or ex partner: Not on file    Emotionally abused: Not on file    Physically abused: Not on file    Forced sexual activity: Not on file  Other Topics Concern  . Not on file  Social History Narrative  . Not on file     PHYSICAL EXAM:  VS: BP 116/72 (BP Location: Left Arm, Patient Position: Sitting, Cuff Size: Normal)   Ht 5\' 4"  (1.626 m)   Wt 143 lb (64.9 kg)   BMI 24.55 kg/m  Physical Exam Gen: NAD, alert, cooperative with exam, well-appearing ENT: normal lips, normal nasal mucosa,  Eye: normal EOM, normal conjunctiva and lids CV:  no edema, +2 pedal pulses   Resp: no accessory muscle use, non-labored,  Skin: no rashes, no areas of induration  Neuro: normal tone, normal sensation to touch Psych:  normal insight, alert and oriented MSK:  Right hip: Normal strength to resistance with hip flexion. No tenderness palpation of the greater trochanter or SI joints. Normal internal and external rotation. Negative straight leg raise bilaterally. Left and right hand. No signs of atrophy. Normal strength resistance with finger abduction and abduction strength. Normal pincer grasp. Normal resistance to thumb extension. Neurovascular intact     ASSESSMENT & PLAN:   Labral tear of hip joint Has had injections in the past and her symptoms are acute on chronic in nature. -Referral to Ortho for consideration of repair. -Counseled on supportive care and home exercise therapy  Carpal tunnel syndrome of left wrist Has tried conservative care up to this point.  Symptoms seem to be consistent with carpal tunnel. -Referral to Ortho for consideration of surgery.   The above documentation has been reviewed and is accurate and complete. Clare Gandy, MD 04/09/2018, 10:49 PM>
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
## 2018-04-05 NOTE — Progress Notes (Deleted)
Kelly Carr - 47 y.o. female MRN [id number]  Date of birth: 1970/08/18  SUBJECTIVE:  Including CC & ROS.  No chief complaint on file.   Kelly Carr is a 47 y.o. female that is  ***.  ***   Review of Systems  HISTORY: Past Medical, Surgical, Social, and Family History Reviewed & Updated per EMR.   Pertinent Historical Findings include:  Past Medical History:  Diagnosis Date  . Abnormal Pap smear    Unknown results>colpo>normal  . Anxiety   . Arthritis   . Bipolar 1 disorder (HCC)     No past surgical history on file.  Allergies  Allergen Reactions  . Haldol [Haloperidol Lactate] Other (See Comments)    Syncope   . Risperidone And Related Other (See Comments)    Zones out  . Tramadol Itching  . Valproic Acid     Family History  Problem Relation Age of Onset  . Diabetes Mother   . Hypertension Mother   . Heart disease Mother 6  . Diabetes Maternal Grandmother   . Heart disease Maternal Grandmother   . Diabetes Maternal Grandfather   . Heart disease Maternal Grandfather   . Breast cancer Neg Hx      Social History   Socioeconomic History  . Marital status: Single    Spouse name: Not on file  . Number of children: Not on file  . Years of education: Not on file  . Highest education level: Not on file  Occupational History  . Not on file  Social Needs  . Financial resource strain: Not on file  . Food insecurity:    Worry: Not on file    Inability: Not on file  . Transportation needs:    Medical: Not on file    Non-medical: Not on file  Tobacco Use  . Smoking status: Current Every Day Smoker    Packs/day: 0.50    Types: Cigarettes  . Smokeless tobacco: Never Used  Substance and Sexual Activity  . Alcohol use: Yes    Comment: drinks wine occasionally  . Drug use: No  . Sexual activity: Yes    Partners: Male    Birth control/protection: Pill  Lifestyle  . Physical activity:    Days per week: Not on file    Minutes per session: Not on  file  . Stress: Not on file  Relationships  . Social connections:    Talks on phone: Not on file    Gets together: Not on file    Attends religious service: Not on file    Active member of club or organization: Not on file    Attends meetings of clubs or organizations: Not on file    Relationship status: Not on file  . Intimate partner violence:    Fear of current or ex partner: Not on file    Emotionally abused: Not on file    Physically abused: Not on file    Forced sexual activity: Not on file  Other Topics Concern  . Not on file  Social History Narrative  . Not on file     PHYSICAL EXAM:  VS: There were no vitals taken for this visit. Physical Exam Gen: NAD, alert, cooperative with exam, well-appearing ENT: normal lips, normal nasal mucosa,  Eye: normal EOM, normal conjunctiva and lids CV:  no edema, +2 pedal pulses   Resp: no accessory muscle use, non-labored,  GI: no masses or tenderness, no hernia  Skin: no rashes, no areas of induration  Neuro: normal tone, normal sensation to touch Psych:  normal insight, alert and oriented MSK:  ***      ASSESSMENT & PLAN:   No problem-specific Assessment & Plan notes found for this encounter.   The above documentation has been reviewed and is accurate and complete. Kelly Gandy, MD 04/05/2018, 8:04 AM>

## 2018-04-06 ENCOUNTER — Ambulatory Visit: Payer: BLUE CROSS/BLUE SHIELD | Admitting: Physician Assistant

## 2018-04-06 ENCOUNTER — Ambulatory Visit: Payer: BLUE CROSS/BLUE SHIELD | Admitting: Family Medicine

## 2018-04-06 ENCOUNTER — Encounter: Payer: Self-pay | Admitting: Family Medicine

## 2018-04-06 ENCOUNTER — Ambulatory Visit: Payer: Self-pay

## 2018-04-06 VITALS — BP 116/72 | Ht 64.0 in | Wt 143.0 lb

## 2018-04-06 DIAGNOSIS — G5602 Carpal tunnel syndrome, left upper limb: Secondary | ICD-10-CM

## 2018-04-06 DIAGNOSIS — S73191D Other sprain of right hip, subsequent encounter: Secondary | ICD-10-CM

## 2018-04-06 NOTE — Patient Instructions (Signed)
Good to see you  You can see what the surgeon says about your hip.  Please let me know if you want to try physical therapy.

## 2018-04-09 NOTE — Assessment & Plan Note (Signed)
Has had injections in the past and her symptoms are acute on chronic in nature. -Referral to Ortho for consideration of repair. -Counseled on supportive care and home exercise therapy

## 2018-04-09 NOTE — Assessment & Plan Note (Signed)
Has tried conservative care up to this point.  Symptoms seem to be consistent with carpal tunnel. -Referral to Ortho for consideration of surgery.

## 2018-04-11 ENCOUNTER — Ambulatory Visit (INDEPENDENT_AMBULATORY_CARE_PROVIDER_SITE_OTHER): Payer: BLUE CROSS/BLUE SHIELD | Admitting: Psychiatry

## 2018-04-11 DIAGNOSIS — F319 Bipolar disorder, unspecified: Secondary | ICD-10-CM

## 2018-04-11 NOTE — Progress Notes (Signed)
      [facility] Counselor/Therapist Progress Note   Patient ID: Kelly Carr, MRN: [id number]  Date: 04/11/2018  Timespent: 48 minutes    Treatment Type: Individual   Reported Symptoms: hurt, depressed, anxious   Mental Status Exam:    Appearance:   Casual     Behavior:  Sharing and angry at family  Motor:  Normal  Speech/Language:   Normal Rate  Affect:  Tearful  Mood:  angry, anxious and depressed  Thought process:  circumstantial  Thought content:    blaming others   Sensory/Perceptual disturbances:    WNL  Orientation:  oriented to person, place, time/date, situation, day of week, month of year and year  Attention:  Fair  Concentration:  Fair  Memory:  WNL  Fund of knowledge:   Good  Insight:    Fair  Judgment:   Fair  Impulse Control:  Fair     Risk Assessment: Danger to Self:  No Self-injurious Behavior: No Danger to Others: No Duty to Warn:no Physical Aggression / Violence:No  Access to Firearms a concern: No  Gang Involvement:No    Subjective:  Patient in today feeling overwhelmed with her hurt and anger that "others are not taking care of me".  Supportive treatment to allow her to vent and express emotions.     Interventions: Solution-Oriented/Positive Psychology and Ego-Supportive   Diagnosis:   ICD-10-CM   1. Bipolar disorder with depression (HCC) F31.9      Plan: Plan to see patient within 1 week to continue goal-directed treatment.   Mathis Fare, LCSW

## 2018-04-12 ENCOUNTER — Ambulatory Visit (INDEPENDENT_AMBULATORY_CARE_PROVIDER_SITE_OTHER): Payer: BLUE CROSS/BLUE SHIELD | Admitting: Obstetrics & Gynecology

## 2018-04-12 ENCOUNTER — Encounter: Payer: Self-pay | Admitting: Obstetrics & Gynecology

## 2018-04-12 VITALS — BP 120/79 | HR 89 | Wt 146.0 lb

## 2018-04-12 DIAGNOSIS — R399 Unspecified symptoms and signs involving the genitourinary system: Secondary | ICD-10-CM | POA: Diagnosis not present

## 2018-04-12 DIAGNOSIS — N912 Amenorrhea, unspecified: Secondary | ICD-10-CM | POA: Diagnosis not present

## 2018-04-12 DIAGNOSIS — Z124 Encounter for screening for malignant neoplasm of cervix: Secondary | ICD-10-CM

## 2018-04-12 DIAGNOSIS — N898 Other specified noninflammatory disorders of vagina: Secondary | ICD-10-CM | POA: Diagnosis not present

## 2018-04-12 DIAGNOSIS — R87618 Other abnormal cytological findings on specimens from cervix uteri: Secondary | ICD-10-CM

## 2018-04-12 DIAGNOSIS — Z1151 Encounter for screening for human papillomavirus (HPV): Secondary | ICD-10-CM

## 2018-04-12 DIAGNOSIS — Z113 Encounter for screening for infections with a predominantly sexual mode of transmission: Secondary | ICD-10-CM

## 2018-04-12 DIAGNOSIS — R8789 Other abnormal findings in specimens from female genital organs: Secondary | ICD-10-CM

## 2018-04-12 DIAGNOSIS — N760 Acute vaginitis: Secondary | ICD-10-CM

## 2018-04-12 DIAGNOSIS — Z9189 Other specified personal risk factors, not elsewhere classified: Secondary | ICD-10-CM | POA: Diagnosis not present

## 2018-04-12 DIAGNOSIS — B9689 Other specified bacterial agents as the cause of diseases classified elsewhere: Secondary | ICD-10-CM

## 2018-04-12 LAB — POCT URINALYSIS DIPSTICK
Bilirubin, UA: NEGATIVE
Blood, UA: NEGATIVE
Glucose, UA: NEGATIVE
Ketones, UA: NEGATIVE
Nitrite, UA: NEGATIVE
Protein, UA: NEGATIVE
Spec Grav, UA: 1.015 (ref 1.010–1.025)
Urobilinogen, UA: 0.2 E.U./dL
pH, UA: 6 (ref 5.0–8.0)

## 2018-04-12 MED ORDER — CIPROFLOXACIN HCL 500 MG PO TABS: 500.0000 mg | ORAL_TABLET | Freq: Two times a day (BID) | ORAL | 0 refills | Status: DC

## 2018-04-12 NOTE — Patient Instructions (Signed)
MyChart allows you to send messages to your doctor, view your lab results (as released by your physician), manage appointments, and more. To sign up, log on to [email or web address] using the Address Bar in your browser.    If you have questions, you can call [phone]) to talk to our MyChart staff. Remember, MyChart is NOT to be used for urgent needs. For medical emergencies, dial 911.

## 2018-04-12 NOTE — Progress Notes (Signed)
Has not had a period since august. Would like to rule out pregnancy and would like STD testing and if possible a PAP. Having frequent urination, would like to check for UTI. Pt also had a 20lb weight loss in July, is now back up to her normal weight, pt states she's feeling better weight.

## 2018-04-12 NOTE — Progress Notes (Signed)
GYNECOLOGY OFFICE VISIT NOTE  History:  47 y.o. [id number] here today for reports of not having periods since July 2019. Also worried about abnormal white vaginal discharge for a few days, and increased urinary frequency/dysuria around her urethral opening for about a few days.  Reports she lost and gained 20 lbs since July. Wants comprehensive STI screen, and repeat pap smear (last one in 04/2017 was normal, but positive HPV (negative 16, 18/45).  She denies any abnormal vaginal bleeding, pelvic pain, fevers, back pain, GI symptoms or other concerns.   Past Medical History:  Diagnosis Date  . Abnormal Pap smear    Unknown results>colpo>normal  . Anxiety   . Arthritis   . Bipolar 1 disorder (HCC)     History reviewed. No pertinent surgical history.  The following portions of the patient's history were reviewed and updated as appropriate: allergies, current medications, past family history, past medical history, past social history, past surgical history and problem list.   Health Maintenance:  Normal pap and positive HRHPV on 05/02/2017.  Normal mammogram on 05/03/2017.   Review of Systems:  Pertinent items noted in HPI and remainder of comprehensive ROS otherwise negative.  Objective:  Physical Exam BP 120/79   Pulse 89   Wt 146 lb (66.2 kg)   LMP 12/25/2017 (Within Days)   BMI 25.06 kg/m  CONSTITUTIONAL: Well-developed, well-nourished female in no acute distress.  HEENT:  Normocephalic, atraumatic. External right and left ear normal. No scleral icterus.  NECK: Normal range of motion, supple, no masses noted on observation SKIN: Skin is warm and dry. No rash noted. Not diaphoretic. No erythema. No pallor. MUSCULOSKELETAL: Normal range of motion. No edema noted. NEUROLOGIC: Alert and oriented to person, place, and time. Normal muscle tone coordination. No cranial nerve deficit noted. PSYCHIATRIC: Normal mood and affect. Normal behavior. Normal judgment and thought  content. CARDIOVASCULAR: Normal heart rate noted RESPIRATORY: Effort and breath sounds normal, no problems with respiration noted ABDOMEN: Soft, no distention noted.   PELVIC: Normal appearing external genitalia; normal appearing vaginal mucosa and cervix.  Copious thin white discharge noted, sample taken.  Pap smear done.  Normal uterine size, no other palpable masses, no uterine or adnexal tenderness.  Labs and Imaging Results for orders placed or performed in visit on 04/12/18 (from the past 168 hour(s))  POCT urinalysis dipstick   Collection Time: 04/12/18 11:55 AM  Result Value Ref Range   Color, UA yellow    Clarity, UA clear    Glucose, UA Negative Negative   Bilirubin, UA negative    Ketones, UA negative    Spec Grav, UA 1.015 1.010 - 1.025   Blood, UA negative    pH, UA 6.0 5.0 - 8.0   Protein, UA Negative Negative   Urobilinogen, UA 0.2 0.2 or 1.0 E.U./dL   Nitrite, UA negative    Leukocytes, UA Small (1+) (A) Negative   Appearance     Odor     Dg Chest 2 View  Result Date: 03/28/2018 CLINICAL DATA:  Shortness of breath and tachycardia EXAM: CHEST - 2 VIEW COMPARISON:  12/11/2016 FINDINGS: The heart size and mediastinal contours are within normal limits. Both lungs are clear. The visualized skeletal structures are unremarkable. IMPRESSION: No active cardiopulmonary disease. Electronically Signed   By: Deatra Robinson M.D.   On: 03/28/2018 02:10    Assessment & Plan:  1. Amenorrhea 2. History of weight change Likely due to being perimenopausal, but will check labs.  wWll follow up results  and manage accordingly. - Follicle stimulating hormone - TSH - Prolactin  3. UTI symptoms Symptomatic, will treat and follow up culture. - POCT urinalysis dipstick - Urine Culture - ciprofloxacin (CIPRO) 500 MG tablet; Take 1 tablet (500 mg total) by mouth 2 (two) times daily.  Dispense: 6 tablet; Refill: 0  4. Vaginal discharge - Cervicovaginal ancillary only, will follow up  results and manage accordingly.  5. Routine screening for STI (sexually transmitted infection) Will follow up results and manage accordingly. - Hepatitis B surface antigen - Hepatitis C antibody - HIV Antibody (routine testing w rflx) - RPR  6. Pap smear abnormality of cervix/human papillomavirus (HPV) positive - Cytology - PAP Will follow up results and manage accordingly. Routine preventative health maintenance measures emphasized, will call to schedule mammogram. Please refer to After Visit Summary for other counseling recommendations.   Return for any gynecologic concerns.   Total face-to-face time with patient: 25 minutes.  Over 50% of encounter was spent on counseling and coordination of care.   Jaynie Collins, MD, FACOG Obstetrician & Gynecologist, [facility] for [employer], [facility]

## 2018-04-13 LAB — FOLLICLE STIMULATING HORMONE: FSH: 9.5 m[IU]/mL

## 2018-04-13 LAB — RPR: RPR Ser Ql: NONREACTIVE

## 2018-04-13 LAB — HEPATITIS C ANTIBODY: Hep C Virus Ab: <0.1 s/co ratio (ref 0.0–0.9)

## 2018-04-13 LAB — PROLACTIN: Prolactin: 39.5 ng/mL — ABNORMAL HIGH (ref 4.8–23.3)

## 2018-04-13 LAB — CERVICOVAGINAL ANCILLARY ONLY
Bacterial vaginitis: POSITIVE — AB
Candida vaginitis: NEGATIVE
Chlamydia: NEGATIVE
Neisseria Gonorrhea: NEGATIVE
Trichomonas: NEGATIVE

## 2018-04-13 LAB — HEPATITIS B SURFACE ANTIGEN: Hepatitis B Surface Ag: NEGATIVE

## 2018-04-13 LAB — TSH: TSH: 1.07 u[IU]/mL (ref 0.450–4.500)

## 2018-04-13 LAB — HIV ANTIBODY (ROUTINE TESTING W REFLEX): HIV Screen 4th Generation wRfx: NONREACTIVE

## 2018-04-14 LAB — URINE CULTURE

## 2018-04-16 ENCOUNTER — Ambulatory Visit (INDEPENDENT_AMBULATORY_CARE_PROVIDER_SITE_OTHER): Payer: BLUE CROSS/BLUE SHIELD | Admitting: Physician Assistant

## 2018-04-16 ENCOUNTER — Encounter: Payer: Self-pay | Admitting: Physician Assistant

## 2018-04-16 DIAGNOSIS — F319 Bipolar disorder, unspecified: Secondary | ICD-10-CM

## 2018-04-16 DIAGNOSIS — F4310 Post-traumatic stress disorder, unspecified: Secondary | ICD-10-CM

## 2018-04-16 DIAGNOSIS — F411 Generalized anxiety disorder: Secondary | ICD-10-CM

## 2018-04-16 MED ORDER — TRAZODONE HCL 50 MG PO TABS: 50.0000 mg | ORAL_TABLET | Freq: Every evening | ORAL | 2 refills | Status: DC | PRN

## 2018-04-16 MED ORDER — FLUCONAZOLE 150 MG PO TABS: 150.0000 mg | ORAL_TABLET | Freq: Once | ORAL | 3 refills | Status: AC

## 2018-04-16 MED ORDER — ARIPIPRAZOLE ER 400 MG IM SRER: 400.0000 mg | INTRAMUSCULAR | 2 refills | Status: DC

## 2018-04-16 MED ORDER — SERTRALINE HCL 50 MG PO TABS: 50.0000 mg | ORAL_TABLET | Freq: Every day | ORAL | 2 refills | Status: DC

## 2018-04-16 MED ORDER — NITROFURANTOIN MONOHYD MACRO 100 MG PO CAPS: 100.0000 mg | ORAL_CAPSULE | Freq: Two times a day (BID) | ORAL | 1 refills | Status: DC

## 2018-04-16 MED ORDER — METRONIDAZOLE 500 MG PO TABS: 500.0000 mg | ORAL_TABLET | Freq: Two times a day (BID) | ORAL | 0 refills | Status: DC

## 2018-04-16 MED ORDER — ARIPIPRAZOLE 20 MG PO TABS: 20.0000 mg | ORAL_TABLET | Freq: Every day | ORAL | 2 refills | Status: DC

## 2018-04-16 MED ORDER — OXCARBAZEPINE 300 MG PO TABS: 300.0000 mg | ORAL_TABLET | Freq: Two times a day (BID) | ORAL | 2 refills | Status: DC
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
## 2018-04-16 NOTE — Addendum Note (Signed)
Addended by: Jaynie Collins A on: 04/16/2018 11:38 AM   Modules accepted: Orders

## 2018-04-18 ENCOUNTER — Ambulatory Visit: Payer: BLUE CROSS/BLUE SHIELD | Admitting: Psychiatry

## 2018-04-18 LAB — CYTOLOGY - PAP
HPV 16/18/45 genotyping: NEGATIVE
HPV: DETECTED — AB

## 2018-04-19 ENCOUNTER — Telehealth: Payer: Self-pay

## 2018-04-19 NOTE — Telephone Encounter (Signed)
-----   Message from Tereso NewcomerUgonna A Anyanwu, MD sent at 04/19/2018 12:17 PM EST ----- Please schedule patient for colposcopy for LGSIL pap done on 04/12/18. Please call to inform patient of results and need for appointment.

## 2018-04-19 NOTE — Telephone Encounter (Signed)
Call patient to inform her of positive hpv shown on pap. She will need to have colposcopy. Phone is accepting calls at this time.

## 2018-04-20 ENCOUNTER — Other Ambulatory Visit: Payer: Self-pay

## 2018-04-20 MED ORDER — HYDROXYZINE HCL 50 MG PO TABS: 150.0000 mg | ORAL_TABLET | Freq: Three times a day (TID) | ORAL | 0 refills | Status: DC | PRN

## 2018-04-23 ENCOUNTER — Other Ambulatory Visit: Payer: Self-pay

## 2018-04-23 MED ORDER — SERTRALINE HCL 50 MG PO TABS: 50.0000 mg | ORAL_TABLET | Freq: Every day | ORAL | 0 refills | Status: DC

## 2018-04-25 ENCOUNTER — Ambulatory Visit: Payer: BLUE CROSS/BLUE SHIELD

## 2018-04-25 ENCOUNTER — Ambulatory Visit: Payer: BLUE CROSS/BLUE SHIELD | Admitting: Psychiatry

## 2018-04-26 ENCOUNTER — Telehealth: Payer: Self-pay

## 2018-04-26 NOTE — Telephone Encounter (Signed)
Informed patient of result of pap smear and the need to scheduled a colposcopy so that we can take a closer look at the abnormal cells. I attempted to scheduled patient appointment but patient will call back at a better time for her. Patient voice understanding at this time.

## 2018-04-30 ENCOUNTER — Other Ambulatory Visit: Payer: Self-pay | Admitting: Physician Assistant

## 2018-04-30 ENCOUNTER — Ambulatory Visit: Payer: BLUE CROSS/BLUE SHIELD

## 2018-04-30 MED ORDER — ARIPIPRAZOLE 20 MG PO TABS: 20.0000 mg | ORAL_TABLET | Freq: Every day | ORAL | 0 refills | Status: DC

## 2018-05-01 ENCOUNTER — Ambulatory Visit: Payer: BLUE CROSS/BLUE SHIELD | Admitting: Psychiatry

## 2018-05-02 ENCOUNTER — Ambulatory Visit: Payer: BLUE CROSS/BLUE SHIELD

## 2018-05-02 ENCOUNTER — Ambulatory Visit: Payer: BLUE CROSS/BLUE SHIELD | Admitting: Psychiatry

## 2018-05-02 DIAGNOSIS — F319 Bipolar disorder, unspecified: Secondary | ICD-10-CM

## 2018-05-02 NOTE — Progress Notes (Signed)
Nurse visit: Pt here for her Abilify Maintena 400 mg injection. She received her first dose prior to her discharge on the in patient unit.   Pt received injection in left glut IM tolerated well. Will schedule follow up in 28 days for next injection.  Lot [id number] Exp Oct 2021

## 2018-05-07 ENCOUNTER — Other Ambulatory Visit: Payer: Self-pay

## 2018-05-07 MED ORDER — PRAZOSIN HCL 2 MG PO CAPS: 2.0000 mg | ORAL_CAPSULE | Freq: Every day | ORAL | 1 refills | Status: DC

## 2018-05-08 ENCOUNTER — Ambulatory Visit: Payer: BLUE CROSS/BLUE SHIELD | Admitting: Student in an Organized Health Care Education/Training Program

## 2018-05-09 ENCOUNTER — Encounter: Payer: Self-pay | Admitting: Physician Assistant

## 2018-05-09 ENCOUNTER — Ambulatory Visit (INDEPENDENT_AMBULATORY_CARE_PROVIDER_SITE_OTHER): Payer: BLUE CROSS/BLUE SHIELD | Admitting: Physician Assistant

## 2018-05-09 DIAGNOSIS — F319 Bipolar disorder, unspecified: Secondary | ICD-10-CM

## 2018-05-09 DIAGNOSIS — F4310 Post-traumatic stress disorder, unspecified: Secondary | ICD-10-CM

## 2018-05-09 DIAGNOSIS — G4700 Insomnia, unspecified: Secondary | ICD-10-CM

## 2018-05-09 MED ORDER — SERTRALINE HCL 50 MG PO TABS: 50.0000 mg | ORAL_TABLET | Freq: Every day | ORAL | 0 refills | Status: DC

## 2018-05-09 NOTE — Progress Notes (Signed)
[facility] Med Check  Patient ID: Kelly Carr,  MRN: [id number]  PCP: Leeroy BockAnderson, Chelsey L, DO  Date of Evaluation: 05/09/2018 Time spent:15 minutes  Chief Complaint:  Chief Complaint    Follow-up      HISTORY/CURRENT STATUS: HPI here for 4-week med check.  "I feel better than I have in a long time.  I feel really stable and my motions are not all over the place.  I am still able to feel the things I think I should but not to abnormal extremes.  The things in my family are getting better as well.  I spoke to my son this morning and will going to be able to get together around Christmas and talk things out.  I am really happy right now.  I started back to work ([employer]) and I am picking up more hours.  It is good for me to be back at work.  I sleep pretty good most of the time.  The only complaint I have is that I have gained a little bit of weight.  But I ate a lot of Thanksgiving too so that could be part of it."  Patient denies loss of interest in usual activities and is able to enjoy things.  Denies decreased energy or motivation.  Appetite has not changed.  No extreme sadness, tearfulness, or feelings of hopelessness.  Denies any changes in concentration, making decisions or remembering things.  Denies suicidal or homicidal thoughts.  Patient denies increased energy with decreased need for sleep, no increased talkativeness, no racing thoughts, no impulsivity or risky behaviors, no increased spending, no increased libido, no grandiosity. Denies hallucinations.  Individual Medical History/ Review of Systems: Changes? :No    Past medications for mental health diagnoses include: Gabapentin, Ativan, BuSpar, Wellbutrin, Latuda, trazodone, Cogentin, Geodon, Trileptal, Vraylar, prazosin, Vistaril, Abilify maintainena, Abilify  Allergies: Haldol [haloperidol lactate]; Risperidone and related; Tramadol; and Valproic acid  Current Medications:  Current Outpatient Medications:  .   ARIPiprazole ER (ABILIFY MAINTENA) 400 MG SRER injection, Inject 2 mLs (400 mg total) into the muscle every 28 (twenty-eight) days., Disp: 1 each, Rfl: 2 .  Ginkgo Biloba (GNP GINGKO BILOBA EXTRACT PO), Take 1 tablet by mouth daily., Disp: , Rfl:  .  hydrOXYzine (ATARAX/VISTARIL) 50 MG tablet, Take 3 tablets (150 mg total) by mouth 3 (three) times daily as needed for anxiety., Disp: 270 tablet, Rfl: 0 .  Oxcarbazepine (TRILEPTAL) 300 MG tablet, Take 1 tablet (300 mg total) by mouth 2 (two) times daily., Disp: 60 tablet, Rfl: 2 .  pantoprazole (PROTONIX) 40 MG tablet, Take 1 tablet (40 mg total) by mouth daily., Disp: 30 tablet, Rfl: 0 .  sertraline (ZOLOFT) 50 MG tablet, Take 1 tablet (50 mg total) by mouth daily., Disp: 90 tablet, Rfl: 0 .  traZODone (DESYREL) 50 MG tablet, Take 1 tablet (50 mg total) by mouth at bedtime as needed and may repeat dose one time if needed for sleep., Disp: 30 tablet, Rfl: 2 .  norgestimate-ethinyl estradiol (ORTHO-CYCLEN,SPRINTEC,PREVIFEM) 0.25-35 MG-MCG tablet, Take 1 tablet by mouth daily. (Patient not taking: Reported on 04/06/2018), Disp: 1 Package, Rfl: 13  Her last Abilify Maintena was given 05/02/2018.  Medication Side Effects: none  Family Medical/ Social History: Changes?  She is having more financial trouble right now.  Her car has been repossessed and she is having transportation issues getting here.  MENTAL HEALTH EXAM:  There were no vitals taken for this visit.There is no height or weight on file to calculate  BMI.  General Appearance: Casual and Well Groomed  Eye Contact:  Good  Speech:  Clear and Coherent  Volume:  Normal  Mood:  Euthymic  Affect:  Appropriate  Thought Process:  Goal Directed  Orientation:  Full (Time, Place, and Person)  Thought Content: Logical   Suicidal Thoughts:  No  Homicidal Thoughts:  No  Memory:  WNL  Judgement:  Good  Insight:  Good  Psychomotor Activity:  Normal  Concentration:  Concentration: Good  Recall:   Good  Fund of Knowledge: Good  Language: Good  Assets:  Desire for Improvement  ADL's:  Intact  Cognition: WNL  Prognosis:  Good    DIAGNOSES:    ICD-10-CM   1. Bipolar I disorder (HCC) F31.9   2. PTSD (post-traumatic stress disorder) F43.10   3. Insomnia, unspecified type G47.00     Receiving Psychotherapy: Yes With Rockne Menghini, LCSW.   RECOMMENDATIONS: I am happy to see her doing so well!. Since she is now on the [facility], we can DC the oral.  She will take 10 mg p.o. daily for 4 days and then stop.  She knows to let me know if any of the manic symptoms recur. Continue all other medications.  She will return every 28 days to have the Abilify injection. Continue psychotherapy with Rockne Menghini, LCSW. Return in 4 to 6 weeks.   Melony Overly, PA-C

## 2018-05-09 NOTE — Patient Instructions (Signed)
Take Abilify 10 mg by mouth for 4 days then stop.

## 2018-05-10 ENCOUNTER — Ambulatory Visit: Payer: BLUE CROSS/BLUE SHIELD | Admitting: Physician Assistant

## 2018-05-11 ENCOUNTER — Other Ambulatory Visit: Payer: Self-pay

## 2018-05-11 MED ORDER — ARIPIPRAZOLE ER 400 MG IM SRER: 400.0000 mg | INTRAMUSCULAR | 2 refills | Status: DC

## 2018-05-14 ENCOUNTER — Ambulatory Visit: Payer: Self-pay | Admitting: Family

## 2018-05-17 ENCOUNTER — Ambulatory Visit (INDEPENDENT_AMBULATORY_CARE_PROVIDER_SITE_OTHER): Payer: BLUE CROSS/BLUE SHIELD | Admitting: Psychiatry

## 2018-05-17 DIAGNOSIS — F319 Bipolar disorder, unspecified: Secondary | ICD-10-CM

## 2018-05-17 NOTE — Progress Notes (Signed)
      [facility] Counselor/Therapist Progress Note  Patient ID: Kelly Carr, MRN: [id number],    Date: 05/17/2018  Time Spent: 60 minutes   Treatment Type: Individual Therapy  Reported Symptoms:  Depressed, stressed, anxious, overwhelmed  Mental Status Exam:  Appearance:   Casual     Behavior:  Appropriate and Sharing  Motor:  Normal  Speech/Language:   Normal Rate  Affect:  Depressed and anxious  Mood:  anxious and depressed  Thought process:  normal  Thought content:    WNL  Sensory/Perceptual disturbances:    WNL  Orientation:  oriented to person, place, time/date, situation, day of week, month of year and year  Attention:  Fair  Concentration:  Fair  Memory:  WNL  Fund of knowledge:   Good  Insight:    Fair  Judgment:   Fair  Impulse Control:  Fair   Risk Assessment: Danger to Self:  No Self-injurious Behavior: No Danger to Others: No Duty to Warn:no Physical Aggression / Violence:No  Access to Firearms a concern: No  Gang Involvement:No   Subjective:  Patient in today with anxiety, depression, feeling overwhelmed, and knows she's making unhealthy decisions.  Has 1 friend at work ([employer]) that seems to be a positive support for patient.  Relationship with 45 yr old daughter (who moved out of the home in recent weeks) is much better now that they are living apart. Processed with patient the unhealthy decisions that she shared with me---especially ones that are unsafe and not well thought through. Talked about ways of holding on to her hope (which she says she does feel), using the good supports she does have, and working to consistently make good decisions. States that she has remained on her meds.  Has appt in approx 3 wks and will be on cancel list.  Interventions: Solution-Oriented/Positive Psychology and Ego-Supportive  Diagnosis:   ICD-10-CM   1. Bipolar disorder with depression (HCC) F31.9     Plan: Patient to follow through on suggestions from  discussion cited above, especially in remaining on her meds and intentionally making safe, healthy decisions.    Kelly Fareeborah Clarice Bonaventure, LCSW

## 2018-05-24 ENCOUNTER — Ambulatory Visit (INDEPENDENT_AMBULATORY_CARE_PROVIDER_SITE_OTHER): Payer: BLUE CROSS/BLUE SHIELD | Admitting: Psychiatry

## 2018-05-24 DIAGNOSIS — F319 Bipolar disorder, unspecified: Secondary | ICD-10-CM | POA: Diagnosis not present

## 2018-05-24 NOTE — Progress Notes (Signed)
      [facility] Counselor/Therapist Progress Note  Patient ID: Kelly Carr, MRN: [id number],    Date: 05/24/2018  Time Spent: 48 minutes  Treatment Type: Individual Therapy   Reported Symptoms: stress, family problems, financial concerns, depression ("but better")  Mental Status Exam:  Appearance:   Casual     Behavior:  Appropriate and Sharing  Motor:  Normal  Speech/Language:   Normal Rate  Affect:  Congruent  Mood:  some depressed mood, stressed  Thought process:  normal  Thought content:    WNL  Sensory/Perceptual disturbances:    WNL  Orientation:  oriented to person, place, time/date, situation, day of week, month of year and year  Attention:  Good  Concentration:  Good  Memory:  WNL  Fund of knowledge:   Good  Insight:    Fair  Judgment:   Fair  Impulse Control:  Good   Risk Assessment: Danger to Self:  No Self-injurious Behavior: No Danger to Others: No Duty to Warn:no Physical Aggression / Violence:No  Access to Firearms a concern: No  Gang Involvement:No   Subjective: Patient in today with the symptoms listed above.  Multiple interpersonal issues going on with patient within her family (especially with her brother and wife, and her adult son).  Patient's mood seems much more leveled out and she states she feel much better being on the Abilify injection.  She states "I feel much more on even keel".  Doesn't like the fact she's gained weight and also knows that one of the main contributing factors is the fact she's been cooking much more sweets over the past couple months and states she is stopping that habit.  Also to start staying better hydrated by increasing water intake.  Needs to focus more on herself and making good choices.  Hoping her relationships with her children improves, and her daughter is already relating to her in a healthier way.,Worked on some CBT techniques and she caught on quickly.  To return for next injection with nurse next week.     Interventions: Solution-Oriented/Positive Psychology and Ego-Supportive, CBT  Diagnosis:   ICD-10-CM   1. Bipolar disorder with depression (HCC) F31.9     Plan: Patient to follow through with practicing CBT techniques and improved choices and self-care.  To return within 2-3 wks.  Will call if cancellation open up to get in sooner.  Kelly Fareeborah Ponce Skillman, LCSW

## 2018-05-27 ENCOUNTER — Encounter: Payer: Self-pay | Admitting: Emergency Medicine

## 2018-05-27 DIAGNOSIS — F319 Bipolar disorder, unspecified: Secondary | ICD-10-CM | POA: Insufficient documentation

## 2018-05-28 ENCOUNTER — Ambulatory Visit: Payer: BLUE CROSS/BLUE SHIELD

## 2018-05-28 NOTE — Progress Notes (Deleted)
Pt did not show up for her Abilify Maint. injectable today. Will contact pt after holiday to get scheduled asap.

## 2018-06-01 ENCOUNTER — Ambulatory Visit: Payer: BLUE CROSS/BLUE SHIELD

## 2018-06-01 DIAGNOSIS — F319 Bipolar disorder, unspecified: Secondary | ICD-10-CM

## 2018-06-01 NOTE — Progress Notes (Signed)
Nurse visit: Pt here for her Abilify Maintena 400 mg injection. Seems to be doing well on monthly injectable, there is her 2nd dose in our office.   Pt received injection in left glut IM tolerated well. Will schedule follow up in 28 days for next injection.  Lot [id number] Exp Oct 2021

## 2018-06-06 ENCOUNTER — Encounter

## 2018-06-13 ENCOUNTER — Other Ambulatory Visit: Payer: Self-pay

## 2018-06-13 ENCOUNTER — Encounter

## 2018-06-13 ENCOUNTER — Ambulatory Visit: Payer: BLUE CROSS/BLUE SHIELD | Admitting: Psychiatry

## 2018-06-13 ENCOUNTER — Ambulatory Visit (HOSPITAL_COMMUNITY)
Admission: EM | Admit: 2018-06-13 | Discharge: 2018-06-13 | Disposition: A | Discharge status: Home / Self Care | Payer: Self-pay | Attending: Family Medicine | Admitting: Family Medicine

## 2018-06-13 ENCOUNTER — Encounter (HOSPITAL_COMMUNITY): Payer: Self-pay

## 2018-06-13 DIAGNOSIS — Z0289 Encounter for other administrative examinations: Secondary | ICD-10-CM

## 2018-06-13 DIAGNOSIS — R21 Rash and other nonspecific skin eruption: Secondary | ICD-10-CM

## 2018-06-13 DIAGNOSIS — W57XXXA Bitten or stung by nonvenomous insect and other nonvenomous arthropods, initial encounter: Secondary | ICD-10-CM

## 2018-06-13 DIAGNOSIS — S60561A Insect bite (nonvenomous) of right hand, initial encounter: Secondary | ICD-10-CM

## 2018-06-13 DIAGNOSIS — L509 Urticaria, unspecified: Secondary | ICD-10-CM

## 2018-06-13 DIAGNOSIS — S40862A Insect bite (nonvenomous) of left upper arm, initial encounter: Secondary | ICD-10-CM

## 2018-06-13 MED ORDER — METHYLPREDNISOLONE ACETATE 80 MG/ML IJ SUSP
80.0000 mg | Freq: Once | INTRAMUSCULAR | Status: AC
  Administered 2018-06-13: 80 mg via INTRAMUSCULAR

## 2018-06-13 MED ORDER — DOXYCYCLINE HYCLATE 100 MG PO CAPS: 100.0000 mg | ORAL_CAPSULE | Freq: Two times a day (BID) | ORAL | 0 refills | Status: AC

## 2018-06-13 MED ORDER — METHYLPREDNISOLONE ACETATE 80 MG/ML IJ SUSP
INTRAMUSCULAR | Status: AC
  Filled 2018-06-13: qty 1

## 2018-06-13 MED ORDER — METHYLPREDNISOLONE SODIUM SUCC 125 MG IJ SOLR
INTRAMUSCULAR | Status: AC
  Filled 2018-06-13: qty 2

## 2018-06-13 MED ORDER — PREDNISONE 10 MG PO TABS: 30.0000 mg | ORAL_TABLET | Freq: Every day | ORAL | 0 refills | Status: AC

## 2018-06-13 NOTE — ED Provider Notes (Signed)
[facility]    CSN: [id number] Arrival date & time: 06/13/18  1150     History   Chief Complaint Chief Complaint  Patient presents with  . Rash    HPI Kelly Carr is a 48 y.o. female.   48 year old female presents with ?insect bites to her left inner forearm near her wrist as well as a few bites to her right hand. Woke up with a few bumps on her left inner forearm today. Then noticed a few small bumps on her right hand. Areas continue to get more swollen.  Very itchy and slightly warm and painful. No drainage. Does not recall anything biting her. Denies any fever, headache, nasal congestion, difficulty swallowing or breathing, chest tightness, nausea or vomiting. No other rash or areas of swelling or bumps on rest of body. No new soaps, detergents or lotions. She has not taken anything yet for symptoms. Has history of bipolar disorder and currently on Abilify, Zoloft and Trileptal daily.   The history is provided by the patient.    Past Medical History:  Diagnosis Date  . Abnormal Pap smear    Unknown results>colpo>normal  . Anxiety   . Arthritis   . Bipolar 1 disorder [facility])     Patient Active Problem List   Diagnosis Date Noted  . Bipolar disorder, unspecified (HCC) 05/27/2018  . PTSD (post-traumatic stress disorder) 03/21/2018  . Constipation 04/14/2017  . Tendinopathy of right gluteus medius 04/05/2017  . Productive cough 03/06/2017  . 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 Manic behavior (HCC)   . Sprain of left ring finger 02/15/2017  . Chest pain 11/23/2016  . Ingrown toenail without infection 09/03/2016  . Right shoulder pain 03/08/2016  . Piriformis syndrome of right side 02/23/2016  . Right shoulder injury 02/17/2016  . HLD (hyperlipidemia) 11/12/2015  . Dysuria 05/26/2015  . Normocytic anemia 05/26/2015  . Pain of upper abdomen 05/04/2015  . Low grade squamous intraepithelial lesion (LGSIL)  on cervical Pap smear 03/04/2015  . Labral tear of hip joint 12/27/2014  . Pharyngitis, chronic 08/11/2014  . Irregular menses 07/17/2013  . GERD (gastroesophageal reflux disease) 11/10/2010  . Carpal tunnel syndrome of left wrist 11/10/2010  . Severe bipolar I disorder, current or most recent episode mixed (HCC) 11/25/2008  . OTH ABNORMAL PAPANICOLAOU SMEAR CERVIX&CERV HPV 11/25/2008  . DEPRESSION 10/21/2008    History reviewed. No pertinent surgical history.  OB History    Gravida  4   Para  2   Term  2   Preterm      AB  2   Living  2     SAB  2   TAB      Ectopic      Multiple      Live Births  2            Home Medications    Prior to Admission medications   Medication Sig Start Date End Date Taking? Authorizing Provider  ARIPiprazole ER (ABILIFY MAINTENA) 400 MG SRER injection Inject 2 mLs (400 mg total) into the muscle every 28 (twenty-eight) days. 05/11/18   Melony Overly T, PA-C  doxycycline (VIBRAMYCIN) 100 MG capsule Take 1 capsule (100 mg total) by mouth 2 (two) times daily for 10 days. 06/13/18 06/23/18  Sudie Grumbling, NP  Ginkgo Biloba (GNP GINGKO BILOBA EXTRACT PO) Take 1 tablet by mouth daily.    [provider]  hydrOXYzine (ATARAX/VISTARIL) 50 MG tablet  Take 3 tablets (150 mg total) by mouth 3 (three) times daily as needed for anxiety. 04/20/18   Cherie OuchHurst, Teresa T, PA-C  Oxcarbazepine (TRILEPTAL) 300 MG tablet Take 1 tablet (300 mg total) by mouth 2 (two) times daily. 04/16/18   Melony OverlyHurst, Teresa T, PA-C  pantoprazole (PROTONIX) 40 MG tablet Take 1 tablet (40 mg total) by mouth daily. 03/27/18   Micheal Likensainville, Christopher T, MD  predniSONE (DELTASONE) 10 MG tablet Take 3 tablets (30 mg total) by mouth daily for 4 days. 06/13/18 06/17/18  Sudie GrumblingAmyot, Ann Berry, NP  sertraline (ZOLOFT) 50 MG tablet Take 1 tablet (50 mg total) by mouth daily. 05/09/18   Cherie OuchHurst, Teresa T, PA-C    Family History Family History  Problem Relation Age of Onset  . Diabetes  Mother   . Hypertension Mother   . Heart disease Mother 6448  . Diabetes Maternal Grandmother   . Heart disease Maternal Grandmother   . Diabetes Maternal Grandfather   . Heart disease Maternal Grandfather   . Breast cancer Neg Hx     Social History Social History   Tobacco Use  . Smoking status: Current Every Day Smoker    Packs/day: 0.50    Types: Cigarettes  . Smokeless tobacco: Never Used  Substance Use Topics  . Alcohol use: Yes    Comment: drinks wine occasionally  . Drug use: No     Allergies   Haldol [haloperidol lactate]; Risperidone and related; Tramadol; and Valproic acid   Review of Systems Review of Systems  Constitutional: Negative for activity change, appetite change, chills, fatigue and fever.  HENT: Negative for congestion, facial swelling, mouth sores, postnasal drip, sore throat and trouble swallowing.   Eyes: Negative for photophobia, pain, discharge, redness, itching and visual disturbance.  Respiratory: Negative for cough, chest tightness, shortness of breath and wheezing.   Cardiovascular: Negative for chest pain and palpitations.  Gastrointestinal: Negative for abdominal pain, nausea and vomiting.  Genitourinary: Negative for decreased urine volume and difficulty urinating.  Musculoskeletal: Negative for arthralgias, back pain, joint swelling and myalgias.  Skin: Positive for color change and wound. Negative for rash.  Neurological: Negative for dizziness, tremors, seizures, syncope, facial asymmetry, weakness, light-headedness, numbness and headaches.  Hematological: Negative for adenopathy. Does not bruise/bleed easily.     Physical Exam Triage Vital Signs ED Triage Vitals  Enc Vitals Group     BP 06/13/18 1245 103/67     Pulse Rate 06/13/18 1245 78     Resp 06/13/18 1245 16     Temp 06/13/18 1245 98.3 F (36.8 C)     Temp src --      SpO2 06/13/18 1245 100 %     Weight 06/13/18 1307 148 lb (67.1 kg)     Height --      Head  Circumference --      Peak Flow --      Pain Score 06/13/18 1307 6     Pain Loc --      Pain Edu? --      Excl. in GC? --    No data found.  Updated Vital Signs BP 103/67 (BP Location: Left Arm)   Pulse 78   Temp 98.3 F (36.8 C)   Resp 16   Wt 148 lb (67.1 kg)   LMP 06/06/2018   SpO2 100%   BMI 25.40 kg/m   Visual Acuity Right Eye Distance:   Left Eye Distance:   Bilateral Distance:    Right Eye Near:  Left Eye Near:    Bilateral Near:     Physical Exam Vitals signs and nursing note reviewed.  Constitutional:      General: She is awake. She is not in acute distress.    Appearance: Normal appearance. She is well-developed and well-groomed. She is not ill-appearing.     Comments: Patient sitting comfortably on exam table in no acute distress.   HENT:     Head: Normocephalic and atraumatic.     Right Ear: Hearing normal.     Left Ear: Hearing normal.     Nose: Nose normal.     Mouth/Throat:     Mouth: Mucous membranes are moist.     Pharynx: Oropharynx is clear. Uvula midline. No pharyngeal swelling, oropharyngeal exudate, posterior oropharyngeal erythema or uvula swelling.  Eyes:     Extraocular Movements: Extraocular movements intact.     Conjunctiva/sclera: Conjunctivae normal.  Neck:     Musculoskeletal: Normal range of motion.  Cardiovascular:     Rate and Rhythm: Normal rate and regular rhythm.  Pulmonary:     Effort: Pulmonary effort is normal. No respiratory distress.     Breath sounds: Normal breath sounds. No wheezing.  Musculoskeletal: Normal range of motion.        General: Swelling present.     Left wrist: She exhibits tenderness and swelling. She exhibits normal range of motion.       Hands:  Skin:    General: Skin is warm and dry.     Capillary Refill: Capillary refill takes less than 2 seconds.     Findings: Erythema, lesion and wound present. No bruising, ecchymosis, petechiae or rash.          Comments: 3 quarter sized raised flesh to  pink urticarial type lesions present on left inner forearm just proximal to wrist. Slightly tender and swollen. No discharge seen. 2 dime sized raised flesh to pink urticarial type lesions present on right ulnar side of hand. Slight swelling. Normal pulses and capillary refill. No neuro deficits noted.   Neurological:     General: No focal deficit present.     Mental Status: She is alert and oriented to person, place, and time.  Psychiatric:        Attention and Perception: Attention normal.        Mood and Affect: Mood and affect normal.        Speech: Speech normal.        Behavior: Behavior normal. Behavior is cooperative.      UC Treatments / Results  Labs (all labs ordered are listed, but only abnormal results are displayed) Labs Reviewed - No data to display  EKG None  Radiology No results found.  Procedures Procedures (including critical care time)  Medications Ordered in UC Medications  methylPREDNISolone acetate (DEPO-MEDROL) injection 80 mg (80 mg Intramuscular Given 06/13/18 1344)    Initial Impression / Assessment and Plan / UC Course  I have reviewed the triage vital signs and the nursing notes.  Pertinent labs & imaging results that were available during my care of the patient were reviewed by me and considered in my medical decision making (see chart for details).    Discussed with patient that she appears to have multiple bites from an insect but unknown source. Discussed that she is having a localized reaction and no signs of infection at this time. Gave DepoMedrol 80mg  IM to help with swelling and itching. May start Prednisone 30mg  daily for 4 days then stop.  May take Benadryl 25mg  every 6 hours as needed for itching or swelling. Apply cool compresses to area for comfort. Note written for work. Continue to monitor area. If increased redness, pain and discharge start to occur, may start Doxycycline 100mg  twice a day as directed- Rx written. Note written for work  for today. Follow up with her PCP in 4 to 5 days if minimal improvement or go to the ER if any difficulty swallowing or breathing, chest pain or hives/lesions worsen.  Final Clinical Impressions(s) / UC Diagnoses   Final diagnoses:  Urticaria  Insect bite of arm, left, initial encounter  Insect bite of right hand with local reaction, initial encounter     Discharge Instructions     You were given a steroid shot today to help with swelling and itching. Recommend start oral Prednisone 30mg  daily for 4 days. May use OTC oral Benadryl 25-50mg  every 6 hours as needed for itching. Apply cool compress to area to help with itching and swelling. If redness worsens with pus formation or discharge, may start Doxycycline 100mg  every 12 hours for 10 days- Rx written. Follow-up with your PCP in 4 to 5 days if not improving or go to the ER if any difficulty swallowing or breathing, chest pain or rash/hives worsen.     ED Prescriptions    Medication Sig Dispense Auth. Provider   predniSONE (DELTASONE) 10 MG tablet Take 3 tablets (30 mg total) by mouth daily for 4 days. 12 tablet Sudie Grumbling, NP   doxycycline (VIBRAMYCIN) 100 MG capsule Take 1 capsule (100 mg total) by mouth 2 (two) times daily for 10 days. 20 capsule Sudie Grumbling, NP     Controlled Substance Prescriptions [place] Controlled Substance Registry consulted? N/A   Sudie Grumbling, NP 06/13/18 2139

## 2018-06-13 NOTE — ED Triage Notes (Signed)
Pt cc her has a rash and swelling on her left wrist and right hand. Pt states she woke up this on her today.

## 2018-06-13 NOTE — Discharge Instructions (Addendum)
You were given a steroid shot today to help with swelling and itching. Recommend start oral Prednisone 30mg  daily for 4 days. May use OTC oral Benadryl 25-50mg  every 6 hours as needed for itching. Apply cool compress to area to help with itching and swelling. If redness worsens with pus formation or discharge, may start Doxycycline 100mg  every 12 hours for 10 days- Rx written. Follow-up with your PCP in 4 to 5 days if not improving or go to the ER if any difficulty swallowing or breathing, chest pain or rash/hives worsen.

## 2018-06-14 ENCOUNTER — Ambulatory Visit (INDEPENDENT_AMBULATORY_CARE_PROVIDER_SITE_OTHER): Payer: Self-pay | Admitting: Psychiatry

## 2018-06-14 DIAGNOSIS — F319 Bipolar disorder, unspecified: Secondary | ICD-10-CM

## 2018-06-14 NOTE — Progress Notes (Signed)
      [facility] Counselor/Therapist Progress Note  Patient ID: Kelly Carr, MRN: [id number],    Date: 06/14/2018  Time Spent: 58 minutes  Treatment Type: Individual Therapy   Reported Symptoms: some anxiety and depression but much less and is more situational  Mental Status Exam:  Appearance:   Casual     Behavior:  Appropriate and Sharing  Motor:  Normal  Speech/Language:   Normal Rate  Affect:  Congruent  Mood:  anxious and depressed but much less than previously  Thought process:  normal  Thought content:    WNL  Sensory/Perceptual disturbances:    WNL  Orientation:  oriented to person, place, time/date, situation, day of week, month of year and year  Attention:  Good  Concentration:  Good  Memory:  WNL  Fund of knowledge:   Good  Insight:    Fair  Judgment:   Good  Impulse Control:  Fair   Risk Assessment: Danger to Self:  No Self-injurious Behavior: No Danger to Others: No Duty to Warn:no Physical Aggression / Violence:No  Access to Firearms a concern: No  Gang Involvement:No   Subjective: Patient in today after missing appt yesterday.  Has maintained most of the gains she has made re: emotional stability.  Depression and anxiety has lessened a lot and is "feeling good most days".  Sleep has improved.  Work is going better.  Patient agrees that the injections are working better for her medications, and she just got her most recent one last week.  Has more hope and feels better on a daily basis.  Is concerned about relationship with son as he has distanced himself from her since the last time she had a bad episode.  She has stopped pushing contacts with him and knows in time that he will understand she is definitely improving.  Considering returning to school in the future.  Plans to start getting more exercise, starting with walking more. Wants to continued to be more stable for a bit before adding on more responsibilities. Also wanting to get back into church and she  has some she wants to check out.  Insight improving as symptoms diminish.  Interventions: Cognitive Behavioral Therapy, Solution-Oriented/Positive Psychology and Ego-Supportive  Diagnosis:   ICD-10-CM   1. Bipolar disorder with depression (HCC) F31.9     Plan: Patient to continue working on positive mindset and good choices  per some CBT strategies reviewed today.  Encouraged that she sees her own growth and improvement.  Scheduling back in 2 wks.  Mathis Fare, LCSW

## 2018-06-15 ENCOUNTER — Ambulatory Visit: Payer: BLUE CROSS/BLUE SHIELD | Admitting: Physician Assistant

## 2018-06-26 ENCOUNTER — Ambulatory Visit: Payer: Self-pay | Admitting: Psychiatry

## 2018-06-28 ENCOUNTER — Encounter: Payer: Self-pay | Admitting: Obstetrics & Gynecology

## 2018-07-03 ENCOUNTER — Ambulatory Visit: Payer: Self-pay

## 2018-07-10 ENCOUNTER — Ambulatory Visit: Payer: Self-pay | Admitting: Psychiatry

## 2018-07-30 ENCOUNTER — Other Ambulatory Visit: Payer: Self-pay

## 2018-07-30 ENCOUNTER — Telehealth: Payer: Self-pay | Admitting: Physician Assistant

## 2018-07-30 MED ORDER — OXCARBAZEPINE 300 MG PO TABS: 300.0000 mg | ORAL_TABLET | Freq: Two times a day (BID) | ORAL | 0 refills | Status: DC

## 2018-07-30 MED ORDER — SERTRALINE HCL 50 MG PO TABS: 50.0000 mg | ORAL_TABLET | Freq: Every day | ORAL | 0 refills | Status: DC

## 2018-07-30 NOTE — Telephone Encounter (Signed)
Rx's submitted per request, has follow up 03/16

## 2018-07-30 NOTE — Telephone Encounter (Signed)
Pt needs refill trileptal, and zoloft sent to [facility] on [address], [place]

## 2018-08-20 ENCOUNTER — Ambulatory Visit: Payer: Self-pay | Admitting: Physician Assistant

## 2018-09-06 ENCOUNTER — Other Ambulatory Visit: Payer: Self-pay

## 2018-09-06 ENCOUNTER — Telehealth (INDEPENDENT_AMBULATORY_CARE_PROVIDER_SITE_OTHER): Payer: Self-pay | Admitting: Family Medicine

## 2018-09-06 DIAGNOSIS — L03113 Cellulitis of right upper limb: Secondary | ICD-10-CM

## 2018-09-06 MED ORDER — CEPHALEXIN 500 MG PO CAPS: 500.0000 mg | ORAL_CAPSULE | Freq: Four times a day (QID) | ORAL | 0 refills | Status: DC

## 2018-09-06 NOTE — Progress Notes (Signed)
[facility] Telemedicine Visit  Patient consented to have visit conducted via telephone.  Encounter participants: Patient: Kelly Carr  Provider: Levert Feinstein  Others (if applicable): n/a  Chief Complaint: red area on arm  HPI:  Patient complains of insect bite on her R lateral arm. Woke up 3 days ago and noted the area to be red and itchy. Since then it has gotten bigger every day. Very itchy and also painful. No fever but has had chills the last few days. She estimates the entire diameter is about 2 inches. No drainage or pus pockets that she can see. Eating and drinking well. She is otherwise feeling well. She is concerned it could be infected.  She tried hydrocortisone, benadryl, benadryl spray, advil, and tylenol without relief. She confirms no allergies to antibiotics.   ROS: no fever, + chills  Pertinent PMHx: history of GERD, depression, hyperlipidemia, bipolar, PTSD  Exam:  Respiratory: normal work of breathing, speaks in full sentences throughout call without any distress. Calm demeanor.  Assessment/Plan:  Cellulitis of arm With report of spreading erythema and chills, concern is for infection. Patient is systemically well and is afebrile (other than reported chills).  Does not sound purulent by description. Will do keflex 500mg  four times a day for 7 days. Discussed with patient that would expect improvement in 1-2 days after starting antibiotics, and if she has no improvement to call us back.  Asked patient to try to upload picture for me to review on mychart - updated her mychart password for her to access it and provided login info.  Patient appreciative and agreeable to this plan.   Time spent on phone with patient: 15 minutes

## 2018-09-13 ENCOUNTER — Telehealth: Payer: Self-pay | Admitting: Physician Assistant

## 2018-09-13 NOTE — Telephone Encounter (Signed)
error 

## 2018-09-17 ENCOUNTER — Other Ambulatory Visit: Payer: Self-pay

## 2018-09-17 ENCOUNTER — Encounter: Payer: Self-pay | Admitting: Physician Assistant

## 2018-09-17 ENCOUNTER — Ambulatory Visit (INDEPENDENT_AMBULATORY_CARE_PROVIDER_SITE_OTHER): Payer: Self-pay | Admitting: Physician Assistant

## 2018-09-17 DIAGNOSIS — F411 Generalized anxiety disorder: Secondary | ICD-10-CM

## 2018-09-17 DIAGNOSIS — F4310 Post-traumatic stress disorder, unspecified: Secondary | ICD-10-CM

## 2018-09-17 DIAGNOSIS — F319 Bipolar disorder, unspecified: Secondary | ICD-10-CM

## 2018-09-17 MED ORDER — OXCARBAZEPINE 300 MG PO TABS: 300.0000 mg | ORAL_TABLET | Freq: Two times a day (BID) | ORAL | 1 refills | Status: DC
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
## 2018-09-20 ENCOUNTER — Telehealth: Payer: Self-pay | Admitting: Physician Assistant

## 2018-09-20 ENCOUNTER — Other Ambulatory Visit: Payer: Self-pay | Admitting: Physician Assistant

## 2018-09-20 MED ORDER — TRAZODONE HCL 50 MG PO TABS: 50.0000 mg | ORAL_TABLET | Freq: Every evening | ORAL | 1 refills | Status: DC | PRN

## 2018-09-20 NOTE — Telephone Encounter (Signed)
I talked with Kelly Carr and she will get the trazodone and fills she will be ok once she gets some sleep.  She understands that the nurse isn't here.  I told her would will call her as soon as the nurse returns.

## 2018-09-20 NOTE — Telephone Encounter (Signed)
Kelly Carr called to report that she is not sleeping well.  Should she go back on the Trazodone? Or is there something she should try?  She just needs something for sleep.  She said she took a vistral on night but it only helped for a couple of hours.  Please let her what you decide.  She is ready to get back on the Kelly Carr but this won't be able to take place until Traci returns to works.  She has been told this but she is anxious to get started and asking when.  She has not yet scheduled her follow up.

## 2018-09-20 NOTE — Telephone Encounter (Signed)
thanks

## 2018-09-20 NOTE — Progress Notes (Signed)
Rx sent in

## 2018-09-20 NOTE — Telephone Encounter (Signed)
Jola Babinski, do you mind calling her back please.  I sent in Trazodone to [facility] in [place], so take that for sleep. On the Abilify, I'm sorry, there's nothing we can do at this point.  Gloris Manchester is sick and the delay can't be helped.  If she needs immediate care, go to [place] or [facility].

## 2018-09-24 ENCOUNTER — Telehealth (INDEPENDENT_AMBULATORY_CARE_PROVIDER_SITE_OTHER): Payer: Self-pay | Admitting: Family Medicine

## 2018-09-24 ENCOUNTER — Other Ambulatory Visit: Payer: Self-pay
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
## 2018-09-24 NOTE — Telephone Encounter (Signed)
Patient wants to schedule telemedicine visit for 4pm today to discuss multiple complaints including new insect bite for which she is taking keflex, palpitations, allergies, UTI sx. Scheduled for 4pm today video visit today. Given link for doxy.me

## 2018-09-25 ENCOUNTER — Other Ambulatory Visit: Payer: Self-pay

## 2018-09-25 ENCOUNTER — Telehealth (INDEPENDENT_AMBULATORY_CARE_PROVIDER_SITE_OTHER): Payer: Self-pay | Admitting: Family Medicine

## 2018-09-25 DIAGNOSIS — T63304S Toxic effect of unspecified spider venom, undetermined, sequela: Secondary | ICD-10-CM

## 2018-09-25 DIAGNOSIS — J302 Other seasonal allergic rhinitis: Secondary | ICD-10-CM

## 2018-09-25 DIAGNOSIS — J029 Acute pharyngitis, unspecified: Secondary | ICD-10-CM

## 2018-09-25 DIAGNOSIS — J069 Acute upper respiratory infection, unspecified: Secondary | ICD-10-CM

## 2018-09-25 DIAGNOSIS — F439 Reaction to severe stress, unspecified: Secondary | ICD-10-CM

## 2018-09-25 MED ORDER — LORATADINE 10 MG PO TABS: 10.0000 mg | ORAL_TABLET | Freq: Every day | ORAL | 1 refills | Status: DC

## 2018-09-25 NOTE — Progress Notes (Signed)
[facility] Medicine Center Telemedicine Visit  Patient consented to have virtual visit. Method of visit: Video was attempted, but technology challenges prevented patient from using video, so visit was conducted via telephone.  Encounter participants: Patient: Kelly Carr - located at Home Provider: Janit Pagan - located at Office Others (if applicable): NA  Chief Complaint: Sore throat, cough,Spider bite, Allergy,mental health issue  HPI:  Sore Throat   This is a new problem. Episode onset: 2 day. The problem has been gradually worsening. There has been no fever. The pain is at a severity of 6/10. The pain is moderate. Associated symptoms include coughing and headaches. Pertinent negatives include no congestion, ear discharge, ear pain, shortness of breath or vomiting. Associated symptoms comments: Hurt when she swallows. She has a runny nose from allergy. She has had exposure to strep. Exposure to: She works at [employer], unclear if she is exposed to someone with confirmed COVID-19. Daughter with hx of strep throat. She has tried nothing for the symptoms.  Cough  This is a new problem. The current episode started in the past 7 days (4 days ago). The problem has been unchanged. The cough is productive of sputum. Associated symptoms include chills, headaches, myalgias and a sore throat. Pertinent negatives include no chest pain, ear pain or shortness of breath. The symptoms are aggravated by fumes (She smoked and now vapes). Risk factors for lung disease include occupational exposure and smoking/tobacco exposure. Treatments tried: Tylenol. The treatment provided mild relief. There is no history of asthma or COPD.  Mental issue: She stated that she is having relationship issue with her boyfriend which is causing her a lot of mental stress. She discussed this already with her Psychiatrist and she was started on a medication for this. She denies suicidal or homicidal ideation. Spider bite:  She had a spider bite about three weeks ago but she did not start the A/B that was prescribed then since her symptoms resolved. However, few days ago, she got another bite with redness and swelling around her arm at the site of the bite. She was told to pick up her Keflex and start using it. She has been on Keflex for 2 days now. Her redness and swelling has completely resolved. She denies any pain or itching. Allergy:C/O itchy and burning eyes as well as runny nose which is typical pf her allergy symptoms. She is currently taking Benadryl prn without any improvement. She will like to know if there is something else to take.  ROS: per HPI  Pertinent PMHx: Problem list reviewed  Exam:  Respiratory: No distress  Assessment/Plan:  Sore throat: I discussed coming in for a strep test. However, since she is already on Keflex, this might not be necessary at this time. She is advised to take Tylenol as needed for pain. Return soon if the sore throat persists after completing A/B. She agreed with the plan.  Cough: Likely viral URI. Use an OTC cough regimen as needed. Self-isolation recommended for 14 given her symptoms of sore throat, although no measured temp. 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  Stress; Continue f/u with her Psychiatrist.  Allergy: Loratidin prescribed. She may use Benadryl as needed.  Spider bite: Complete course of A/B. F/U as needed.  Time spent during visit with patient: 30 minutes

## 2018-09-27 ENCOUNTER — Telehealth (INDEPENDENT_AMBULATORY_CARE_PROVIDER_SITE_OTHER): Payer: Self-pay | Admitting: Family Medicine

## 2018-09-27 ENCOUNTER — Encounter: Payer: Self-pay | Admitting: Family Medicine

## 2018-09-27 ENCOUNTER — Other Ambulatory Visit: Payer: Self-pay

## 2018-09-27 NOTE — Progress Notes (Signed)
[facility] Medicine Center Telemedicine Visit  Patient consented to have virtual visit. Method of visit: Telephone  Encounter participants: Patient: Kelly Carr - located at Home Provider: Janit Pagan - located at Office Others (if applicable): NA  Chief Complaint: Cough, Sore throat, body aches  HPI:  The patient wanted a follow-up call regarding her symptoms. Her cough is dry, non-productive, no SOB, or chest pain. She uses multiple OTC cough regimen. She is also taking MVI and vitamin C with elderberry. Her throat pain is worsening, and she believes this is from excessive cough. No fever but occasional chills.  I offered an in-office strep test since her risk for COVID-19 is low based on hx. She stated she does not have anyone to bring her to the clinic. She is also still on her A/B.  I advised her to use Tylenol OTC 1-2 tab Q6-8 hrs prn pain, keep well hydrated, rest as at home. Use one type of MVI, and Vitamin C. Use one type of OTC cough regimen prn. I advised her that taking too many different OTC regimen for her symptoms can lead to serious side effects. She verbalized understanding. ED precaution discussed.  Continue home self-isolation and good hand hygiene.  ROS: per HPI  Pertinent PMHx: Problem list reviewed  Exam:  Respiratory: No Resp Distress  Assessment/Plan:  Self Isolate and rest at home. See instruction above.  Time spent during visit with patient: 8 minutes

## 2018-10-07 ENCOUNTER — Encounter (HOSPITAL_COMMUNITY): Payer: Self-pay | Admitting: Emergency Medicine

## 2018-10-07 ENCOUNTER — Other Ambulatory Visit: Payer: Self-pay

## 2018-10-07 ENCOUNTER — Emergency Department (HOSPITAL_COMMUNITY)
Admission: EM | Admit: 2018-10-07 | Discharge: 2018-10-08 | Disposition: A | Discharge status: Other Acute Inpatient Hospital | Payer: Self-pay | Attending: Emergency Medicine | Admitting: Emergency Medicine

## 2018-10-07 DIAGNOSIS — Z79899 Other long term (current) drug therapy: Secondary | ICD-10-CM | POA: Insufficient documentation

## 2018-10-07 DIAGNOSIS — F17290 Nicotine dependence, other tobacco product, uncomplicated: Secondary | ICD-10-CM | POA: Insufficient documentation

## 2018-10-07 DIAGNOSIS — F3113 Bipolar disorder, current episode manic without psychotic features, severe: Secondary | ICD-10-CM | POA: Insufficient documentation

## 2018-10-07 DIAGNOSIS — R45851 Suicidal ideations: Secondary | ICD-10-CM | POA: Insufficient documentation

## 2018-10-07 NOTE — ED Triage Notes (Signed)
PT PRESENTS WITH SUICIDAL THOUGHTS; HAD HANDFUL OF PILLS AND HAD NOTES WRITTEN TO FAMILY MEMBERS, RECENTLY HAD DRAMA AMONGST FAMILY MEMBERS

## 2018-10-08 ENCOUNTER — Other Ambulatory Visit: Payer: Self-pay

## 2018-10-08 ENCOUNTER — Inpatient Hospital Stay (HOSPITAL_COMMUNITY)
Admission: AD | Admit: 2018-10-08 | Discharge: 2018-10-11 | DRG: 881 | Disposition: A | Discharge status: Home / Self Care | Payer: No Typology Code available for payment source | Source: Intra-hospital | Attending: Psychiatry | Admitting: Psychiatry

## 2018-10-08 ENCOUNTER — Encounter (HOSPITAL_COMMUNITY): Payer: Self-pay

## 2018-10-08 DIAGNOSIS — D649 Anemia, unspecified: Secondary | ICD-10-CM | POA: Diagnosis present

## 2018-10-08 DIAGNOSIS — F410 Panic disorder [episodic paroxysmal anxiety] without agoraphobia: Secondary | ICD-10-CM | POA: Diagnosis present

## 2018-10-08 DIAGNOSIS — F329 Major depressive disorder, single episode, unspecified: Principal | ICD-10-CM | POA: Diagnosis present

## 2018-10-08 DIAGNOSIS — Z79899 Other long term (current) drug therapy: Secondary | ICD-10-CM

## 2018-10-08 DIAGNOSIS — F3110 Bipolar disorder, current episode manic without psychotic features, unspecified: Secondary | ICD-10-CM | POA: Diagnosis not present

## 2018-10-08 DIAGNOSIS — G4700 Insomnia, unspecified: Secondary | ICD-10-CM | POA: Diagnosis present

## 2018-10-08 DIAGNOSIS — F419 Anxiety disorder, unspecified: Secondary | ICD-10-CM | POA: Diagnosis not present

## 2018-10-08 DIAGNOSIS — Z818 Family history of other mental and behavioral disorders: Secondary | ICD-10-CM

## 2018-10-08 DIAGNOSIS — F17210 Nicotine dependence, cigarettes, uncomplicated: Secondary | ICD-10-CM | POA: Diagnosis present

## 2018-10-08 DIAGNOSIS — R45851 Suicidal ideations: Secondary | ICD-10-CM | POA: Diagnosis present

## 2018-10-08 HISTORY — DX: Major depressive disorder, single episode, unspecified: F32.9

## 2018-10-08 HISTORY — DX: Unspecified asthma, uncomplicated: J45.909

## 2018-10-08 HISTORY — DX: Depression, unspecified: F32.A

## 2018-10-08 LAB — SALICYLATE LEVEL: Salicylate Lvl: <7 mg/dL (ref 2.8–30.0)

## 2018-10-08 LAB — CBC
HCT: 34.7 % — ABNORMAL LOW (ref 36.0–46.0)
Hemoglobin: 11 g/dL — ABNORMAL LOW (ref 12.0–15.0)
MCH: 28.3 pg (ref 26.0–34.0)
MCHC: 31.7 g/dL (ref 30.0–36.0)
MCV: 89.2 fL (ref 80.0–100.0)
Platelets: 356 10*3/uL (ref 150–400)
RBC: 3.89 MIL/uL (ref 3.87–5.11)
RDW: 13.3 % (ref 11.5–15.5)
WBC: 15.3 10*3/uL — ABNORMAL HIGH (ref 4.0–10.5)
nRBC: 0 % (ref 0.0–0.2)

## 2018-10-08 LAB — COMPREHENSIVE METABOLIC PANEL
ALT: 41 U/L (ref 0–44)
AST: 51 U/L — ABNORMAL HIGH (ref 15–41)
Albumin: 3.7 g/dL (ref 3.5–5.0)
Alkaline Phosphatase: 76 U/L (ref 38–126)
Anion gap: 7 (ref 5–15)
BUN: 5 mg/dL — ABNORMAL LOW (ref 6–20)
CO2: 26 mmol/L (ref 22–32)
Calcium: 8.6 mg/dL — ABNORMAL LOW (ref 8.9–10.3)
Chloride: 106 mmol/L (ref 98–111)
Creatinine, Ser: 0.64 mg/dL (ref 0.44–1.00)
GFR calc Af Amer: >60 mL/min (ref >60)
GFR calc non Af Amer: >60 mL/min (ref >60)
Glucose, Bld: 109 mg/dL — ABNORMAL HIGH (ref 70–99)
Potassium: 3.7 mmol/L (ref 3.5–5.1)
Sodium: 139 mmol/L (ref 135–145)
Total Bilirubin: 0.6 mg/dL (ref 0.3–1.2)
Total Protein: 6.3 g/dL — ABNORMAL LOW (ref 6.5–8.1)

## 2018-10-08 LAB — ACETAMINOPHEN LEVEL: Acetaminophen (Tylenol), Serum: <10 ug/mL — ABNORMAL LOW (ref 10–30)

## 2018-10-08 LAB — RAPID URINE DRUG SCREEN, HOSP PERFORMED
Amphetamines: NOT DETECTED
Barbiturates: NOT DETECTED
Benzodiazepines: NOT DETECTED
Cocaine: NOT DETECTED
Opiates: NOT DETECTED
Tetrahydrocannabinol: POSITIVE — AB

## 2018-10-08 LAB — ETHANOL: Alcohol, Ethyl (B): <10 mg/dL (ref <10)

## 2018-10-08 LAB — I-STAT BETA HCG BLOOD, ED (MC, WL, AP ONLY): I-stat hCG, quantitative: <5 m[IU]/mL (ref <5)

## 2018-10-08 MED ORDER — ALUM & MAG HYDROXIDE-SIMETH 200-200-20 MG/5ML PO SUSP: 30.0000 mL | ORAL | Status: DC | PRN

## 2018-10-08 MED ORDER — LORATADINE 10 MG PO TABS
10.0000 mg | ORAL_TABLET | Freq: Every day | ORAL | Status: DC
  Administered 2018-10-08: 10 mg via ORAL
  Filled 2018-10-08: qty 1

## 2018-10-08 MED ORDER — MAGNESIUM HYDROXIDE 400 MG/5ML PO SUSP: 30.0000 mL | Freq: Every day | ORAL | Status: DC | PRN

## 2018-10-08 MED ORDER — PANTOPRAZOLE SODIUM 40 MG PO TBEC
40.0000 mg | DELAYED_RELEASE_TABLET | Freq: Every day | ORAL | Status: DC
  Administered 2018-10-08: 40 mg via ORAL
  Filled 2018-10-08: qty 1

## 2018-10-08 MED ORDER — NICOTINE 21 MG/24HR TD PT24
21.0000 mg | MEDICATED_PATCH | Freq: Every day | TRANSDERMAL | Status: DC
  Administered 2018-10-08 (×2): 21 mg via TRANSDERMAL
  Filled 2018-10-08 (×2): qty 1

## 2018-10-08 MED ORDER — ACETAMINOPHEN 325 MG PO TABS
650.0000 mg | ORAL_TABLET | Freq: Four times a day (QID) | ORAL | Status: DC | PRN
  Administered 2018-10-09 – 2018-10-10 (×3): 650 mg via ORAL
  Filled 2018-10-08 (×3): qty 2

## 2018-10-08 MED ORDER — SERTRALINE HCL 50 MG PO TABS
50.0000 mg | ORAL_TABLET | Freq: Every day | ORAL | Status: DC
  Administered 2018-10-08: 50 mg via ORAL
  Filled 2018-10-08 (×2): qty 1

## 2018-10-08 MED ORDER — TRAZODONE HCL 50 MG PO TABS: 50.0000 mg | ORAL_TABLET | Freq: Every evening | ORAL | Status: DC | PRN

## 2018-10-08 MED ORDER — TRAZODONE HCL 50 MG PO TABS
50.0000 mg | ORAL_TABLET | Freq: Every evening | ORAL | Status: DC | PRN
  Administered 2018-10-10 (×2): 50 mg via ORAL
  Filled 2018-10-08 (×3): qty 1

## 2018-10-08 MED ORDER — OXCARBAZEPINE 300 MG PO TABS
300.0000 mg | ORAL_TABLET | Freq: Two times a day (BID) | ORAL | Status: DC
  Administered 2018-10-08: 300 mg via ORAL
  Filled 2018-10-08: qty 1

## 2018-10-08 NOTE — ED Provider Notes (Signed)
[facility] EMERGENCY DEPARTMENT Provider Note   CSN: [id number] Arrival date & time: 10/07/18  2254    History   Chief Complaint Chief Complaint  Patient presents with  . Suicidal    HPI Mayling Tarvin is a 48 y.o. female.     Patient to ED with severe SI, plan to overdose. She states she had pills in her hand ready to ingest them, was writing letters to family members. She has history of depression, SI with attempt. Here voluntarily. She denies self injury prior to arrival.   The history is provided by the patient. No language interpreter was used.    Past Medical History:  Diagnosis Date  . Abnormal Pap smear    Unknown results>colpo>normal  . Anxiety   . Arthritis   . Bipolar 1 disorder [facility])     Patient Active Problem List   Diagnosis Date Noted  . Bipolar disorder, unspecified (HCC) 05/27/2018  . PTSD (post-traumatic stress disorder) 03/21/2018  . Constipation 04/14/2017  . Tendinopathy of right gluteus medius 04/05/2017  . Productive cough 03/06/2017  . 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 Manic behavior (HCC)   . Sprain of left ring finger 02/15/2017  . Chest pain 11/23/2016  . Ingrown toenail without infection 09/03/2016  . Right shoulder pain 03/08/2016  . Piriformis syndrome of right side 02/23/2016  . Right shoulder injury 02/17/2016  . HLD (hyperlipidemia) 11/12/2015  . Dysuria 05/26/2015  . Normocytic anemia 05/26/2015  . Pain of upper abdomen 05/04/2015  . 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 Labral tear of hip joint 12/27/2014  . Pharyngitis, chronic 08/11/2014  . Irregular menses 07/17/2013  . GERD (gastroesophageal reflux disease) 11/10/2010  . Carpal tunnel syndrome of left wrist 11/10/2010  . Severe bipolar I disorder, current or most recent episode mixed (HCC) 11/25/2008  . OTH ABNORMAL PAPANICOLAOU SMEAR CERVIX&CERV  HPV 11/25/2008  . DEPRESSION 10/21/2008    History reviewed. No pertinent surgical history.   OB History    Gravida  4   Para  2   Term  2   Preterm      AB  2   Living  2     SAB  2   TAB      Ectopic      Multiple      Live Births  2            Home Medications    Prior to Admission medications   Medication Sig Start Date End Date Taking? Authorizing Provider  ARIPiprazole ER (ABILIFY MAINTENA) 400 MG SRER injection Inject 2 mLs (400 mg total) into the muscle every 28 (twenty-eight) days. 05/11/18   Melony Overly T, PA-C  cephALEXin (KEFLEX) 500 MG capsule Take 1 capsule (500 mg total) by mouth 4 (four) times daily. For 7 days 09/06/18   Latrelle Dodrill, MD  Ginkgo Biloba (GNP GINGKO BILOBA EXTRACT PO) Take 1 tablet by mouth daily.    [provider]  hydrOXYzine (ATARAX/VISTARIL) 50 MG tablet Take 3 tablets (150 mg total) by mouth 3 (three) times daily as needed for anxiety. 04/20/18   Melony Overly T, PA-C  loratadine (CLARITIN) 10 MG tablet Take 1 tablet (10 mg total) by mouth daily. 09/25/18   Doreene Eland, MD  Oxcarbazepine (TRILEPTAL) 300 MG tablet Take 1 tablet (300 mg total) by mouth 2 (two) times daily. 09/17/18   Cherie Ouch, PA-C  pantoprazole (PROTONIX) 40 MG tablet Take 1 tablet (40 mg total) by mouth daily. 03/27/18   Micheal Likensainville, Christopher T, MD  sertraline (ZOLOFT) 50 MG tablet Take 1 tablet (50 mg total) by mouth daily. 07/30/18   Cherie OuchHurst, Teresa T, PA-C  traZODone (DESYREL) 50 MG tablet Take 1-2 tablets (50-100 mg total) by mouth at bedtime as needed for sleep. 09/20/18   Cherie OuchHurst, Teresa T, PA-C    Family History Family History  Problem Relation Age of Onset  . Diabetes Mother   . Hypertension Mother   . Heart disease Mother 3448  . Diabetes Maternal Grandmother   . Heart disease Maternal Grandmother   . Diabetes Maternal Grandfather   . Heart disease Maternal Grandfather   . Breast cancer Neg Hx     Social History Social  History   Tobacco Use  . Smoking status: Current Every Day Smoker    Packs/day: 0.00    Types: Cigarettes, E-cigarettes  . Smokeless tobacco: Never Used  Substance Use Topics  . Alcohol use: Yes    Comment: drinks wine occasionally  . Drug use: No     Allergies   Haldol [haloperidol lactate]; Risperidone and related; Tramadol; and Valproic acid   Review of Systems Review of Systems  Constitutional: Negative for chills and fever.  HENT: Negative.   Respiratory: Negative.   Cardiovascular: Negative.   Gastrointestinal: Negative.   Musculoskeletal: Negative.   Skin: Negative.   Neurological: Negative.   Psychiatric/Behavioral: Positive for dysphoric mood and suicidal ideas.     Physical Exam Updated Vital Signs BP (!) 149/104 (BP Location: Right Arm)   Pulse 100   Temp 98 F (36.7 C) (Oral)   Resp 20   Ht 5\' 4"  (1.626 m)   Wt 70.3 kg   LMP 09/24/2018   SpO2 100%   BMI 26.61 kg/m   Physical Exam Vitals signs and nursing note reviewed.  Constitutional:      Appearance: She is well-developed.  HENT:     Head: Normocephalic.  Neck:     Musculoskeletal: Normal range of motion and neck supple.  Cardiovascular:     Rate and Rhythm: Normal rate and regular rhythm.  Pulmonary:     Effort: Pulmonary effort is normal.     Breath sounds: Normal breath sounds. No wheezing, rhonchi or rales.  Abdominal:     General: Bowel sounds are normal.     Palpations: Abdomen is soft.     Tenderness: There is no abdominal tenderness. There is no guarding or rebound.  Musculoskeletal: Normal range of motion.  Skin:    General: Skin is warm and dry.     Findings: No rash.  Neurological:     Mental Status: She is alert and oriented to person, place, and time.  Psychiatric:        Attention and Perception: She does not perceive auditory or visual hallucinations.        Mood and Affect: Mood is depressed. Affect is flat.        Behavior: Behavior is slowed.        Thought  Content: Thought content includes suicidal ideation. Thought content includes suicidal plan.      ED Treatments / Results  Labs (all labs ordered are listed, but only abnormal results are displayed) Labs Reviewed  COMPREHENSIVE METABOLIC PANEL - Abnormal; Notable for the following components:      Result Value   Glucose, Bld 109 (*)    BUN 5 (*)    Calcium  8.6 (*)    Total Protein 6.3 (*)    AST 51 (*)    All other components within normal limits  ACETAMINOPHEN LEVEL - Abnormal; Notable for the following components:   Acetaminophen (Tylenol), Serum <10 (*)    All other components within normal limits  CBC - Abnormal; Notable for the following components:   WBC 15.3 (*)    Hemoglobin 11.0 (*)    HCT 34.7 (*)    All other components within normal limits  ETHANOL  SALICYLATE LEVEL  RAPID URINE DRUG SCREEN, HOSP PERFORMED  I-STAT BETA HCG BLOOD, ED ([facility], [facility], [facility] ONLY)    EKG None  Radiology No results found.  Procedures Procedures (including critical care time)  Medications Ordered in ED Medications - No data to display   Initial Impression / Assessment and Plan / ED Course  I have reviewed the triage vital signs and the nursing notes.  Pertinent labs & imaging results that were available during my care of the patient were reviewed by me and considered in my medical decision making (see chart for details).        Patient to ED with SI, plan to overdose, history of SI with attempt.   Medical clearance initiated. Will request TTS consultation to determine placement.   Patient has been medically cleared. She initially voiced desire to leave the hospital. IVC papers were filled out ready to file in the case that she attempts to leave the premises. (in office on [facility]).  Per TTS, patient meets inpatient criteria and placement is being sought.   Final Clinical Impressions(s) / ED Diagnoses   Final diagnoses:  None   1. SI 2. Depression  ED Discharge Orders     None       Danne Harbor 10/08/18 Terrall Laity, MD 10/08/18 [phone]

## 2018-10-08 NOTE — [facility] Counselor (Signed)
Clinician attempted to contact pt's nurse to set up cart however the call rolled over the to receptionist, Gabby. Joyice Faster attempted to connect clinician to pt's nurse however there was no answer. Clinician to check back.    Redmond Pulling, MS, [facility], [facility] Triage Specialist [phone]

## 2018-10-08 NOTE — Tx Team (Signed)
Initial Treatment Plan 10/08/2018 4:10 PM [name] OIT:[id number]    PATIENT STRESSORS: Marital or family conflict Substance abuse   PATIENT STRENGTHS: Capable of independent living General fund of knowledge   PATIENT IDENTIFIED PROBLEMS: "I drink a lot"  "I'm just in a really bad place"                   DISCHARGE CRITERIA:  Ability to meet basic life and health needs Adequate post-discharge living arrangements Improved stabilization in mood, thinking, and/or behavior  PRELIMINARY DISCHARGE PLAN: Outpatient therapy  PATIENT/FAMILY INVOLVEMENT: This treatment plan has been presented to and reviewed with the patient, Kelly Carr. The patient and family have been given the opportunity to ask questions and make suggestions.  Dewayne Shorter, RN 10/08/2018, 4:10 PM

## 2018-10-08 NOTE — ED Notes (Addendum)
Pt belongings along with valuables sent with [facility].  Pt d/c from ED and transported to [facility].

## 2018-10-08 NOTE — ED Notes (Signed)
Lunch has been ordered  

## 2018-10-08 NOTE — ED Notes (Signed)
Pt arrived to [facility] after showering. Pt calm, cooperative, quiet. Sitter present.
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
## 2018-10-08 NOTE — Progress Notes (Signed)
Pt. meets criteria for inpatient treatment per Nira Conn, NP.  No appropriate beds available at [facility]. Referred out to the following hospitals:  [facility]  [facility]  [facility]  [facility]  [facility]  [facility]  [facility]  [facility]  [facility]  [facility]  [facility]     Disposition CSW will continue to follow for placement.  Timmothy Euler. Kaylyn Lim, MSW, LCSW Disposition Clinical Social Work [phone] (cell) [phone] (office)

## 2018-10-08 NOTE — ED Notes (Signed)
Ordered bfast tray 

## 2018-10-08 NOTE — ED Provider Notes (Signed)
48 year old female presents today with SI. Pt is currently resting in exam bed in no acute distress. Pt will be transferred to [facility].  Vitals:   10/08/18 0617 10/08/18 1335  BP: (!) 111/44 139/80  Pulse: 77 92  Resp: 18 20  Temp: 98.3 F (36.8 C) 98 F (36.7 C)  SpO2: 99% 99%      Eyvonne Mechanic, PA-C 10/08/18 1424    Margarita Grizzle, MD 10/09/18 1038

## 2018-10-08 NOTE — Progress Notes (Signed)
Pt accepted to [facility], Bed 407-1   Nira Conn, NP is the accepting provider.  Nehemiah Massed, MD is the attending provider.  Call report to [phone]  [facility] ED notified.   Pt is Voluntary.  Pt may be transported by [facility]  Patient is scheduled  to arrive at [facility] @14 :00  Carney Bern T. Kaylyn Lim, MSW, LCSW Disposition Clinical Social Work [phone] (cell) [phone] (office)

## 2018-10-08 NOTE — ED Notes (Signed)
Informed by patient assessment, pt does indeed fit criteria for inpatient admission. Currently looking for placement
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
## 2018-10-08 NOTE — ED Notes (Signed)
Staffing office called, requested sitter, none available currently

## 2018-10-08 NOTE — ED Notes (Addendum)
Patient pulled RN aside to inform me she felt "staff doesn't want her here we wish she would have just killed herself." Assured patient that was far from the truth. She then tearfully said she wished she never came her and just went through with plan. Sitter at bedside consoling patient.

## 2018-10-09 DIAGNOSIS — F3110 Bipolar disorder, current episode manic without psychotic features, unspecified: Secondary | ICD-10-CM

## 2018-10-09 DIAGNOSIS — F419 Anxiety disorder, unspecified: Secondary | ICD-10-CM

## 2018-10-09 DIAGNOSIS — G4700 Insomnia, unspecified: Secondary | ICD-10-CM

## 2018-10-09 LAB — LIPID PANEL
Cholesterol: 215 mg/dL — ABNORMAL HIGH (ref 0–200)
HDL: 85 mg/dL (ref >40)
LDL Cholesterol: 113 mg/dL — ABNORMAL HIGH (ref 0–99)
Total CHOL/HDL Ratio: 2.5 RATIO
Triglycerides: 83 mg/dL (ref <150)
VLDL: 17 mg/dL (ref 0–40)

## 2018-10-09 LAB — TSH: TSH: 2.058 u[IU]/mL (ref 0.350–4.500)

## 2018-10-09 LAB — INFLUENZA PANEL BY PCR (TYPE A & B)
Influenza A By PCR: NEGATIVE
Influenza B By PCR: NEGATIVE

## 2018-10-09 LAB — HEMOGLOBIN A1C
Hgb A1c MFr Bld: 5 % (ref 4.8–5.6)
Mean Plasma Glucose: 96.8 mg/dL

## 2018-10-09 MED ORDER — ARIPIPRAZOLE ER 400 MG IM SRER
400.0000 mg | Freq: Once | INTRAMUSCULAR | Status: AC
  Administered 2018-10-09: 400 mg via INTRAMUSCULAR

## 2018-10-09 MED ORDER — OXCARBAZEPINE 150 MG PO TABS
150.0000 mg | ORAL_TABLET | Freq: Two times a day (BID) | ORAL | Status: DC
  Administered 2018-10-09 – 2018-10-10 (×3): 150 mg via ORAL
  Filled 2018-10-09 (×7): qty 1

## 2018-10-09 MED ORDER — NICOTINE 21 MG/24HR TD PT24
21.0000 mg | MEDICATED_PATCH | Freq: Every day | TRANSDERMAL | Status: DC
  Administered 2018-10-09 – 2018-10-11 (×3): 21 mg via TRANSDERMAL
  Filled 2018-10-09 (×5): qty 1

## 2018-10-09 MED ORDER — HYDROXYZINE HCL 25 MG PO TABS
25.0000 mg | ORAL_TABLET | Freq: Four times a day (QID) | ORAL | Status: DC | PRN
  Administered 2018-10-09 – 2018-10-10 (×3): 25 mg via ORAL
  Filled 2018-10-09: qty 10
  Filled 2018-10-09 (×3): qty 1

## 2018-10-09 MED ORDER — SERTRALINE HCL 50 MG PO TABS
50.0000 mg | ORAL_TABLET | Freq: Every day | ORAL | Status: DC
  Administered 2018-10-09 – 2018-10-11 (×3): 50 mg via ORAL
  Filled 2018-10-09 (×2): qty 1
  Filled 2018-10-09: qty 7
  Filled 2018-10-09 (×4): qty 1

## 2018-10-09 NOTE — Progress Notes (Signed)
Pt expressed feeling increasingly paranoid and feeling like people are talking about her. Pt expressed to Clinical research associate that she feels trapped in her room and are having racing thoughts. While pt was pacing in the hallway. Another nurse overheard the pt tell another pt that she was going to keep acting out in order to get more days in the hospital.

## 2018-10-09 NOTE — BHH Suicide Risk Assessment (Signed)
[facility] Admission Suicide Risk Assessment   Nursing information obtained from:  Patient Demographic factors:  Caucasian Current Mental Status:  NA Loss Factors:  Loss of significant relationship Historical Factors:  Family history of mental illness or substance abuse, Impulsivity Risk Reduction Factors:  Sense of responsibility to family, Positive social support  Total Time spent with patient: 30 minutes Principal Problem: <principal problem not specified> Diagnosis:  Active Problems:   MDD (major depressive disorder)  Subjective Data: Patient is seen and examined.  Patient is a 48 year old female with a past psychiatric history significant for bipolar disorder who presented to the [facility] emergency department on 10/08/2018 with suicidal ideation.  The patient stated that she had a history of bipolar disorder, and was going to overdose on medications.  She also stated that she had not had her medications in several months.  She was also focused on a "sore throat" that she was fearful was something much more significant.  She had last been seen by a primary care provider on 4/23 secondary to sore throat pain.  This is been going on for some time.  She was too paranoid to go to the outpatient clinic for lab test because of the coronavirus fears.  She was last seen by the tele-evaluation team.  She admitted the last time that she had had her Abilify and other medications was "several months ago.  Her last psychiatric evaluation was in December 2019, and was taking the long-acting Abilify injection, Trileptal, sertraline and was doing well.  Her drug screen was positive for marijuana, her beta hCG was negative.  Her white blood cell count is elevated at 15.3.  She is mildly anemic with a hemoglobin of 11 and a hematocrit of 34.7.  Her TSH was 2.058.  She was pressured, agitated and what appeared to be significantly manic.  She was admitted to the hospital for evaluation and stabilization.  Continued  Clinical Symptoms:  Alcohol Use Disorder Identification Test Final Score (AUDIT): 29 The "Alcohol Use Disorders Identification Test", Guidelines for Use in Primary Care, Second Edition.  World Science writer [facility]). Score between 0-7:  no or low risk or alcohol related problems. Score between 8-15:  moderate risk of alcohol related problems. Score between 16-19:  high risk of alcohol related problems. Score 20 or above:  warrants further diagnostic evaluation for alcohol dependence and treatment.   CLINICAL FACTORS:   Bipolar Disorder:   Mixed State Depression:   Anhedonia Comorbid alcohol abuse/dependence Hopelessness Impulsivity Insomnia Alcohol/Substance Abuse/Dependencies Unstable or Poor Therapeutic Relationship Previous Psychiatric Diagnoses and Treatments   Musculoskeletal: Strength & Muscle Tone: within normal limits Gait & Station: normal Patient leans: N/A  Psychiatric Specialty Exam: Physical Exam  Nursing note and vitals reviewed. Constitutional: She is oriented to person, place, and time. She appears well-developed and well-nourished.  HENT:  Head: Normocephalic and atraumatic.  Respiratory: Effort normal.  Neurological: She is alert and oriented to person, place, and time.    ROS  Blood pressure 135/90, pulse (!) 101, temperature 98 F (36.7 C), temperature source Oral, height 5\' 5"  (1.651 m), weight 68.9 kg, last menstrual period 09/24/2018.Body mass index is 25.29 kg/m.  General Appearance: Disheveled  Eye Contact:  Fair  Speech:  Pressured  Volume:  Increased  Mood:  Anxious, Depressed, Dysphoric and Irritable  Affect:  Congruent  Thought Process:  Coherent and Descriptions of Associations: Circumstantial  Orientation:  Full (Time, Place, and Person)  Thought Content:  Rumination  Suicidal Thoughts:  No  Homicidal Thoughts:  No  Memory:  Immediate;   Fair Recent;   Fair Remote;   Fair  Judgement:  Impaired  Insight:  Lacking  Psychomotor  Activity:  Increased  Concentration:  Concentration: Fair and Attention Span: Fair  Recall:  [employer]  Fund of Knowledge:  Fair  Language:  Fair  Akathisia:  Negative  Handed:  Right  AIMS (if indicated):     Assets:  Desire for Improvement Resilience  ADL's:  Intact  Cognition:  WNL  Sleep:  Number of Hours: 5.25      COGNITIVE FEATURES THAT CONTRIBUTE TO RISK:  None    SUICIDE RISK:   Mild:  Suicidal ideation of limited frequency, intensity, duration, and specificity.  There are no identifiable plans, no associated intent, mild dysphoria and related symptoms, good self-control (both objective and subjective assessment), few other risk factors, and identifiable protective factors, including available and accessible social support.  PLAN OF CARE: Patient is seen and examined.  Patient is a 48 year old female with the above-stated past psychiatric history who was admitted to the hospital secondary to worsening bipolar symptoms.  She also has an elevated white blood cell count, low-grade fever, and a sore throat.  We will swab her for strep, and also get influenza a and B.  She will be restarted on her Abilify long-acting injection.  She will get 400 mg IM x1 today.  She stated she had previously been taking Trileptal 300 mg p.o. twice daily, but had been off it for several months.  I will restart her at 150 mg p.o. twice daily.  Her Zoloft will be restarted at 50 mg p.o. daily as well as the trazodone at 50 mg p.o. nightly as needed insomnia.  Once her culture material comes back and we know what we might be dealing with we will treated accordingly.  She will be placed on droplet precautions initially.  She will be encouraged to attend groups.  She will be integrated into the milieu, and we work on compliance with psychiatric medications during the course the hospitalization.  I certify that inpatient services furnished can reasonably be expected to improve the patient's condition.   Antonieta PertGreg  Lawson  Carlyon, MD 10/09/2018, 1:28 PM

## 2018-10-09 NOTE — Progress Notes (Signed)
Patient ID: Kelly Carr, female   DOB: 06-07-1970, 48 y.o.   MRN: [id number]  D: Patient pleasant on approach some thought blocking noted tonight. Mood continues to be depressed. No active suicidal ideations noted.  A: Staff will monitor on q 15 minute checks, follow treatment plan, and give medications as ordered. R: Cooperative on the unit.
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
## 2018-10-09 NOTE — Progress Notes (Signed)
Pt presents with a flat affect and anxious mood. Pt reported ongoing depression this morning but expressed feeling better today. Pt denies SI/HI. Pt verbally contracts for safety. Pt noted to be hyper-verbal and hyper-religous on approach. Pt endorses AVH prior to coming in to the hospital after mixing up her medications. Pt denies any active hallucinations today. Pt expressed feeling sad due to previous conflict with her family and now she feels guilty for telling them she was going to kill herself.   Medications reviewed with pt. Verbal support provided. Pt encouraged to attend groups. 15 minute checks performed for safety. Pt placed on droplet precautions due to c/o sore throat and coughing. Pt swabbed for flu and strep.   Pt compliant with tx plan
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
## 2018-10-10 LAB — CBC WITH DIFFERENTIAL/PLATELET
Abs Immature Granulocytes: 0.04 10*3/uL (ref 0.00–0.07)
Basophils Absolute: 0 10*3/uL (ref 0.0–0.1)
Basophils Relative: 0 %
Eosinophils Absolute: 0.1 10*3/uL (ref 0.0–0.5)
Eosinophils Relative: 1 %
HCT: 37 % (ref 36.0–46.0)
Hemoglobin: 11.7 g/dL — ABNORMAL LOW (ref 12.0–15.0)
Immature Granulocytes: 0 %
Lymphocytes Relative: 28 %
Lymphs Abs: 2.9 10*3/uL (ref 0.7–4.0)
MCH: 29.1 pg (ref 26.0–34.0)
MCHC: 31.6 g/dL (ref 30.0–36.0)
MCV: 92 fL (ref 80.0–100.0)
Monocytes Absolute: 0.9 10*3/uL (ref 0.1–1.0)
Monocytes Relative: 9 %
Neutro Abs: 6.3 10*3/uL (ref 1.7–7.7)
Neutrophils Relative %: 62 %
Platelets: 355 10*3/uL (ref 150–400)
RBC: 4.02 MIL/uL (ref 3.87–5.11)
RDW: 13.6 % (ref 11.5–15.5)
WBC: 10.3 10*3/uL (ref 4.0–10.5)
nRBC: 0 % (ref 0.0–0.2)

## 2018-10-10 MED ORDER — IBUPROFEN 400 MG PO TABS
400.0000 mg | ORAL_TABLET | Freq: Four times a day (QID) | ORAL | Status: DC | PRN
  Administered 2018-10-10: 400 mg via ORAL
  Filled 2018-10-10: qty 1

## 2018-10-10 MED ORDER — OXCARBAZEPINE 300 MG PO TABS
300.0000 mg | ORAL_TABLET | Freq: Two times a day (BID) | ORAL | Status: DC
  Administered 2018-10-10: 300 mg via ORAL
  Filled 2018-10-10 (×4): qty 1

## 2018-10-10 MED ORDER — OXCARBAZEPINE 300 MG PO TABS
300.0000 mg | ORAL_TABLET | ORAL | Status: AC
  Administered 2018-10-10: 300 mg via ORAL
  Filled 2018-10-10: qty 1
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
## 2018-10-10 NOTE — Progress Notes (Signed)
[facility] MD Progress Note  10/10/2018 12:49 PM Kelly Carr  MRN:  [id number] Subjective:  Patient is a 48 year old female with a past psychiatric history significant for bipolar disorder who presented to the [facility] emergency department on 10/08/2018 with suicidal ideation. The patient stated that she had a history of bipolar disorder, and was going to overdose on medications.   Objective: Patient is seen and examined.  Patient is a 48 year old female with the above-stated past psychiatric history seen in follow-up.  She is doing slightly better today, but still manic.  She remains pressured and tangential.  She tries to make an excuse that she "talks all the time anyway".  She has tolerated the start of her Abilify long-acting injection.  She is less focused on her sore throat and coronavirus.  Her influenza a and B were both negative.  Her strep is negative so far.  She is afebrile.  Her blood pressure stable at 135/87, she is mildly tachycardic at 105.  Her temperature this a.m. is 98.3.  She slept 6 hours last night.  Her laboratories revealed an elevated AST and ALT at 51 and 41 respectively.  These are up from 34.  Drug screen was positive for marijuana.  CG was negative.  Principal Problem: <principal problem not specified> Diagnosis: Active Problems:   MDD (major depressive disorder)  Total Time spent with patient: 15 minutes  Past Psychiatric History: An H&P  Past Medical History:  Past Medical History:  Diagnosis Date  . Abnormal Pap smear    Unknown results>colpo>normal  . Anxiety   . Arthritis   . Asthma   . Bipolar 1 disorder (HCC)   . Depression     Past Surgical History:  Procedure Laterality Date  . NO PAST SURGERIES     Family History:  Family History  Problem Relation Age of Onset  . Diabetes Mother   . Hypertension Mother   . Heart disease Mother 28  . Schizophrenia Mother   . Diabetes Maternal Grandmother   . Heart disease Maternal Grandmother   . Diabetes  Maternal Grandfather   . Heart disease Maternal Grandfather   . Bipolar disorder Cousin   . Bipolar disorder Nephew   . Depression Daughter    Family Psychiatric  History: See admission H&P Social History:  Social History   Substance and Sexual Activity  Alcohol Use Yes   Comment: "a lot"     Social History   Substance and Sexual Activity  Drug Use Not Currently    Social History   Socioeconomic History  . Marital status: Single    Spouse name: Not on file  . Number of children: Not on file  . Years of education: Not on file  . Highest education level: Not on file  Occupational History  . Not on file  Social Needs  . Financial resource strain: Not on file  . Food insecurity:    Worry: Not on file    Inability: Not on file  . Transportation needs:    Medical: Not on file    Non-medical: Not on file  Tobacco Use  . Smoking status: Current Every Day Smoker    Packs/day: 1.50    Types: Cigarettes, E-cigarettes  . Smokeless tobacco: Never Used  Substance and Sexual Activity  . Alcohol use: Yes    Comment: "a lot"  . Drug use: Not Currently  . Sexual activity: Yes    Partners: Male    Birth control/protection: None  Lifestyle  .  Physical activity:    Days per week: Not on file    Minutes per session: Not on file  . Stress: Not on file  Relationships  . Social connections:    Talks on phone: Not on file    Gets together: Not on file    Attends religious service: Not on file    Active member of club or organization: Not on file    Attends meetings of clubs or organizations: Not on file    Relationship status: Not on file  Other Topics Concern  . Not on file  Social History Narrative  . Not on file   Additional Social History:                         Sleep: Fair  Appetite:  Fair  Current Medications: Current Facility-Administered Medications  Medication Dose Route Frequency Provider Last Rate Last Dose  . acetaminophen (TYLENOL) tablet 650  mg  650 mg Oral Q6H PRN Denzil Magnuson, NP   650 mg at 10/10/18 1009  . alum & mag hydroxide-simeth (MAALOX/MYLANTA) 200-200-20 MG/5ML suspension 30 mL  30 mL Oral Q4H PRN Denzil Magnuson, NP      . hydrOXYzine (ATARAX/VISTARIL) tablet 25 mg  25 mg Oral Q6H PRN Antonieta Pert, MD   25 mg at 10/10/18 0141  . magnesium hydroxide (MILK OF MAGNESIA) suspension 30 mL  30 mL Oral Daily PRN Denzil Magnuson, NP      . nicotine (NICODERM CQ - dosed in mg/24 hours) patch 21 mg  21 mg Transdermal Daily Nira Conn A, NP   21 mg at 10/10/18 0800  . Oxcarbazepine (TRILEPTAL) tablet 300 mg  300 mg Oral BID Antonieta Pert, MD      . sertraline (ZOLOFT) tablet 50 mg  50 mg Oral Daily Antonieta Pert, MD   50 mg at 10/10/18 0800  . traZODone (DESYREL) tablet 50 mg  50 mg Oral QHS PRN Jackelyn Poling, NP   50 mg at 10/10/18 0141    Lab Results:  Results for orders placed or performed during the hospital encounter of 10/08/18 (from the past 48 hour(s))  Lipid panel     Status: Abnormal   Collection Time: 10/09/18  7:32 AM  Result Value Ref Range   Cholesterol 215 (H) 0 - 200 mg/dL   Triglycerides 83 <161 mg/dL   HDL 85 >09 mg/dL   Total CHOL/HDL Ratio 2.5 RATIO   VLDL 17 0 - 40 mg/dL   LDL Cholesterol 604 (H) 0 - 99 mg/dL    Comment:        Total Cholesterol/HDL:CHD Risk Coronary Heart Disease Risk Table                     Men   Women  1/2 Average Risk   3.4   3.3  Average Risk       5.0   4.4  2 X Average Risk   9.6   7.1  3 X Average Risk  23.4   11.0        Use the calculated Patient Ratio above and the CHD Risk Table to determine the patient's CHD Risk.        ATP III CLASSIFICATION (LDL):  <100     mg/dL   Optimal  540-981  mg/dL   Near or Above  Optimal  130-159  mg/dL   Borderline  505-697  mg/dL   High  >948     mg/dL   Very High Performed at [facility], [address] [place], [place] [address]   Hemoglobin A1c     Status: None    Collection Time: 10/09/18  7:32 AM  Result Value Ref Range   Hgb A1c MFr Bld 5.0 4.8 - 5.6 %    Comment: (NOTE) Pre diabetes:          5.7%-6.4% Diabetes:              >6.4% Glycemic control for   <7.0% adults with diabetes    Mean Plasma Glucose 96.8 mg/dL    Comment: Performed at [facility], [address], [place], [place] [address]  TSH     Status: None   Collection Time: 10/09/18  7:32 AM  Result Value Ref Range   TSH 2.058 0.350 - 4.500 uIU/mL    Comment: Performed by a 3rd Generation assay with a functional sensitivity of <=0.01 uIU/mL. Performed at [facility], [address] [place], [place] [address]   Culture, group A strep     Status: None (Preliminary result)   Collection Time: 10/09/18 12:59 PM  Result Value Ref Range   Specimen Description      THROAT Performed at [facility], [address], [place], [place] [address]    Special Requests      NONE Performed at [facility], [address], [place], [place] [address]    Culture      CULTURE REINCUBATED FOR BETTER GROWTH Performed at [facility], [address] [place], [place] [address]    Report Status PENDING   Influenza panel by PCR (type A & B)     Status: None   Collection Time: 10/09/18 12:59 PM  Result Value Ref Range   Influenza A By PCR NEGATIVE NEGATIVE   Influenza B By PCR NEGATIVE NEGATIVE    Comment: (NOTE) The Xpert Xpress Flu assay is intended as an aid in the diagnosis of  influenza and should not be used as a sole basis for treatment.  This  assay is FDA approved for nasopharyngeal swab specimens only. Nasal  washings and aspirates are unacceptable for Xpert Xpress Flu testing. Performed at [facility], [address] [place], [place] [address]   CBC with Differential/Platelet     Status: Abnormal   Collection Time: 10/10/18  6:42 AM  Result Value Ref Range   WBC 10.3 4.0 - 10.5 K/uL    RBC 4.02 3.87 - 5.11 MIL/uL   Hemoglobin 11.7 (L) 12.0 - 15.0 g/dL   HCT 25.4 98.2 - 64.1 %   MCV 92.0 80.0 - 100.0 fL   MCH 29.1 26.0 - 34.0 pg   MCHC 31.6 30.0 - 36.0 g/dL   RDW 58.3 09.4 - 07.6 %   Platelets 355 150 - 400 K/uL   nRBC 0.0 0.0 - 0.2 %   Neutrophils Relative % 62 %   Neutro Abs 6.3 1.7 - 7.7 K/uL   Lymphocytes Relative 28 %   Lymphs Abs 2.9 0.7 - 4.0 K/uL   Monocytes Relative 9 %   Monocytes Absolute 0.9 0.1 - 1.0 K/uL   Eosinophils Relative 1 %   Eosinophils Absolute 0.1 0.0 - 0.5 K/uL   Basophils Relative 0 %   Basophils Absolute 0.0 0.0 - 0.1 K/uL  Immature Granulocytes 0 %   Abs Immature Granulocytes 0.04 0.00 - 0.07 K/uL    Comment: Performed at [facility], [address] [place], [place] [id number]    Blood Alcohol level:  Lab Results  Component Value Date   ETH <10 10/08/2018   ETH <10 03/20/2018    Metabolic Disorder Labs: Lab Results  Component Value Date   HGBA1C 5.0 10/09/2018   MPG 96.8 10/09/2018   MPG 96.8 03/21/2018   Lab Results  Component Value Date   PROLACTIN 39.5 (H) 04/12/2018   PROLACTIN 2.0 12/05/2011   Lab Results  Component Value Date   CHOL 215 (H) 10/09/2018   TRIG 83 10/09/2018   HDL 85 10/09/2018   CHOLHDL 2.5 10/09/2018   VLDL 17 10/09/2018   LDLCALC 113 (H) 10/09/2018   LDLCALC 118 (H) 03/21/2018    Physical Findings: AIMS: Facial and Oral Movements Muscles of Facial Expression: None, normal Lips and Perioral Area: None, normal Jaw: None, normal Tongue: None, normal,Extremity Movements Upper (arms, wrists, hands, fingers): None, normal Lower (legs, knees, ankles, toes): None, normal, Trunk Movements Neck, shoulders, hips: None, normal, Overall Severity Severity of abnormal movements (highest score from questions above): None, normal Incapacitation due to abnormal movements: None, normal Patient's awareness of abnormal movements (rate only patient's report): No Awareness, Dental  Status Current problems with teeth and/or dentures?: No Does patient usually wear dentures?: No  CIWA:    COWS:     Musculoskeletal: Strength & Muscle Tone: within normal limits Gait & Station: normal Patient leans: N/A  Psychiatric Specialty Exam: Physical Exam  Nursing note and vitals reviewed. Constitutional: She is oriented to person, place, and time. She appears well-developed and well-nourished.  HENT:  Head: Normocephalic and atraumatic.  Respiratory: Effort normal.  Neurological: She is alert and oriented to person, place, and time.    ROS  Blood pressure 135/87, pulse (!) 105, temperature 98.3 F (36.8 C), temperature source Oral, height 5\' 5"  (1.651 m), weight 68.9 kg, last menstrual period 09/24/2018.Body mass index is 25.29 kg/m.  General Appearance: Casual  Eye Contact:  Good  Speech:  Pressured  Volume:  Increased  Mood:  Euphoric  Affect:  Labile  Thought Process:  Coherent and Descriptions of Associations: Tangential  Orientation:  Full (Time, Place, and Person)  Thought Content:  Tangential  Suicidal Thoughts:  No  Homicidal Thoughts:  No  Memory:  Immediate;   Fair Recent;   Fair Remote;   Fair  Judgement:  Impaired  Insight:  Fair  Psychomotor Activity:  Increased  Concentration:  Concentration: Poor and Attention Span: Poor  Recall:  [employer]Fair  Fund of Knowledge:  Fair  Language:  Fair  Akathisia:  Negative  Handed:  Right  AIMS (if indicated):     Assets:  Desire for Improvement Resilience  ADL's:  Intact  Cognition:  WNL  Sleep:  Number of Hours: 6     Treatment Plan Summary: Daily contact with patient to assess and evaluate symptoms and progress in treatment, Medication management and Plan : Patient is seen and examined.  Patient is a 48 year old female with the above-stated past psychiatric history who is seen in follow-up.   Diagnosis: #1 bipolar disorder, most recently manic, severe without psychotic features, #2 pharyngitis  Patient  is a bit improved, but still significantly manic.  I am going to increase her Trileptal up to 300 mg p.o. twice daily.  No change in the sertraline or Abilify long-acting injection at this point.  Hopefully she will begin to stabilize.  No other changes in her medications.  We will monitor her liver function enzymes. 1.  Patient received the Abilify long-acting injection on 10/09/2018.  This is for mood stability. 2.  Continue hydroxyzine 25 mg p.o. every 6 hours as needed anxiety or itching. 3.  Increase Trileptal to 300 mg p.o. twice daily for mood stability. 4.  Continue Zoloft 50 mg p.o. daily for depression and anxiety. 5.  Continue trazodone 50 mg p.o. nightly as needed insomnia. 6.  Monitor liver function enzymes 7.  Monitor results of strep swab. 8.  Disposition planning-in progress.  Antonieta Pert, MD 10/10/2018, 12:49 PM

## 2018-10-10 NOTE — Tx Team (Signed)
Interdisciplinary Treatment and Diagnostic Plan Update  10/10/2018 Time of Session:  Kelly Carr MRN: [id number]  Principal Diagnosis: <principal problem not specified>  Secondary Diagnoses: Active Problems:   MDD (major depressive disorder)   Current Medications:  Current Facility-Administered Medications  Medication Dose Route Frequency Provider Last Rate Last Dose  . acetaminophen (TYLENOL) tablet 650 mg  650 mg Oral Q6H PRN Denzil Magnusonhomas, Lashunda, NP   650 mg at 10/09/18 2126  . alum & mag hydroxide-simeth (MAALOX/MYLANTA) 200-200-20 MG/5ML suspension 30 mL  30 mL Oral Q4H PRN Denzil Magnusonhomas, Lashunda, NP      . hydrOXYzine (ATARAX/VISTARIL) tablet 25 mg  25 mg Oral Q6H PRN Antonieta Pertlary, Greg Lawson, MD   25 mg at 10/10/18 0141  . magnesium hydroxide (MILK OF MAGNESIA) suspension 30 mL  30 mL Oral Daily PRN Denzil Magnusonhomas, Lashunda, NP      . nicotine (NICODERM CQ - dosed in mg/24 hours) patch 21 mg  21 mg Transdermal Daily Nira ConnBerry, Jason A, NP   21 mg at 10/10/18 0800  . OXcarbazepine (TRILEPTAL) tablet 150 mg  150 mg Oral BID Antonieta Pertlary, Greg Lawson, MD   150 mg at 10/10/18 0800  . sertraline (ZOLOFT) tablet 50 mg  50 mg Oral Daily Antonieta Pertlary, Greg Lawson, MD   50 mg at 10/10/18 0800  . traZODone (DESYREL) tablet 50 mg  50 mg Oral QHS PRN Nira ConnBerry, Jason A, NP   50 mg at 10/10/18 0141   PTA Medications: Medications Prior to Admission  Medication Sig Dispense Refill Last Dose  . ARIPiprazole ER (ABILIFY MAINTENA) 400 MG SRER injection Inject 2 mLs (400 mg total) into the muscle every 28 (twenty-eight) days. (Patient not taking: Reported on 10/08/2018) 1 each 2 Not Taking at Unknown time  . cephALEXin (KEFLEX) 500 MG capsule Take 1 capsule (500 mg total) by mouth 4 (four) times daily. For 7 days (Patient not taking: Reported on 10/08/2018) 28 capsule 0 Completed Course at Unknown time  . hydrOXYzine (ATARAX/VISTARIL) 50 MG tablet Take 3 tablets (150 mg total) by mouth 3 (three) times daily as needed for anxiety. (Patient not  taking: Reported on 10/08/2018) 270 tablet 0 Not Taking at Unknown time  . loratadine (CLARITIN) 10 MG tablet Take 1 tablet (10 mg total) by mouth daily. 30 tablet 1 Past Month at Unknown time  . Oxcarbazepine (TRILEPTAL) 300 MG tablet Take 1 tablet (300 mg total) by mouth 2 (two) times daily. 60 tablet 1 Past Month at Unknown time  . pantoprazole (PROTONIX) 40 MG tablet Take 1 tablet (40 mg total) by mouth daily. 30 tablet 0 Past Month at Unknown time  . sertraline (ZOLOFT) 50 MG tablet Take 1 tablet (50 mg total) by mouth daily. 90 tablet 0 Past Month at Unknown time  . traZODone (DESYREL) 50 MG tablet Take 1-2 tablets (50-100 mg total) by mouth at bedtime as needed for sleep. 60 tablet 1 Past Month at Unknown time    Patient Stressors: Marital or family conflict Substance abuse  Patient Strengths: Capable of independent living General fund of knowledge  Treatment Modalities: Medication Management, Group therapy, Case management,  1 to 1 session with clinician, Psychoeducation, Recreational therapy.   Physician Treatment Plan for Primary Diagnosis: <principal problem not specified> Long Term Goal(s): Improvement in symptoms so as ready for discharge Improvement in symptoms so as ready for discharge   Short Term Goals: Ability to identify changes in lifestyle to reduce recurrence of condition will improve Ability to verbalize feelings will improve Ability to disclose and  discuss suicidal ideas Ability to demonstrate self-control will improve Ability to identify and develop effective coping behaviors will improve Ability to maintain clinical measurements within normal limits will improve Compliance with prescribed medications will improve Ability to identify triggers associated with substance abuse/mental health issues will improve Ability to identify changes in lifestyle to reduce recurrence of condition will improve Ability to verbalize feelings will improve Ability to disclose and  discuss suicidal ideas Ability to demonstrate self-control will improve Ability to identify and develop effective coping behaviors will improve Ability to maintain clinical measurements within normal limits will improve Compliance with prescribed medications will improve Ability to identify triggers associated with substance abuse/mental health issues will improve  Medication Management: Evaluate patient's response, side effects, and tolerance of medication regimen.  Therapeutic Interventions: 1 to 1 sessions, Unit Group sessions and Medication administration.  Evaluation of Outcomes: Progressing  Physician Treatment Plan for Secondary Diagnosis: Active Problems:   MDD (major depressive disorder)  Long Term Goal(s): Improvement in symptoms so as ready for discharge Improvement in symptoms so as ready for discharge   Short Term Goals: Ability to identify changes in lifestyle to reduce recurrence of condition will improve Ability to verbalize feelings will improve Ability to disclose and discuss suicidal ideas Ability to demonstrate self-control will improve Ability to identify and develop effective coping behaviors will improve Ability to maintain clinical measurements within normal limits will improve Compliance with prescribed medications will improve Ability to identify triggers associated with substance abuse/mental health issues will improve Ability to identify changes in lifestyle to reduce recurrence of condition will improve Ability to verbalize feelings will improve Ability to disclose and discuss suicidal ideas Ability to demonstrate self-control will improve Ability to identify and develop effective coping behaviors will improve Ability to maintain clinical measurements within normal limits will improve Compliance with prescribed medications will improve Ability to identify triggers associated with substance abuse/mental health issues will improve     Medication  Management: Evaluate patient's response, side effects, and tolerance of medication regimen.  Therapeutic Interventions: 1 to 1 sessions, Unit Group sessions and Medication administration.  Evaluation of Outcomes: Progressing   RN Treatment Plan for Primary Diagnosis: <principal problem not specified> Long Term Goal(s): Knowledge of disease and therapeutic regimen to maintain health will improve  Short Term Goals: Ability to participate in decision making will improve, Ability to verbalize feelings will improve, Ability to disclose and discuss suicidal ideas and Ability to identify and develop effective coping behaviors will improve  Medication Management: RN will administer medications as ordered by provider, will assess and evaluate patient's response and provide education to patient for prescribed medication. RN will report any adverse and/or side effects to prescribing provider.  Therapeutic Interventions: 1 on 1 counseling sessions, Psychoeducation, Medication administration, Evaluate responses to treatment, Monitor vital signs and CBGs as ordered, Perform/monitor CIWA, COWS, AIMS and Fall Risk screenings as ordered, Perform wound care treatments as ordered.  Evaluation of Outcomes: Progressing   LCSW Treatment Plan for Primary Diagnosis: <principal problem not specified> Long Term Goal(s): Safe transition to appropriate next level of care at discharge, Engage patient in therapeutic group addressing interpersonal concerns.  Short Term Goals: Engage patient in aftercare planning with referrals and resources  Therapeutic Interventions: Assess for all discharge needs, 1 to 1 time with Social worker, Explore available resources and support systems, Assess for adequacy in community support network, Educate family and significant other(s) on suicide prevention, Complete Psychosocial Assessment, Interpersonal group therapy.  Evaluation of Outcomes: Progressing  Progress in  Treatment: Attending groups: No. Participating in groups: No. Taking medication as prescribed: Yes. Toleration medication: Yes. Family/Significant other contact made: No, will contact:  patient declined consent for collateral contacts Patient understands diagnosis: Yes. Discussing patient identified problems/goals with staff: Yes. Medical problems stabilized or resolved: Yes. Denies suicidal/homicidal ideation: Yes. Issues/concerns per patient self-inventory: No. Other:   New problem(s) identified:  None   New Short Term/Long Term Goal(s): medication stabilization, elimination of SI thoughts, development of comprehensive mental wellness plan.    Patient Goals:    Discharge Plan or Barriers: Patient plans to return home and continue to follow up with [facility] for medication management and Thalia Bloodgood for therapy services.   Reason for Continuation of Hospitalization: Depression Medication stabilization  Estimated Length of Stay: 1-2 days   Attendees: Patient: 10/10/2018 8:21 AM  Physician: Dr. Landry Mellow, MD 10/10/2018 8:21 AM  Nursing: Arlyss Repress.J, RN 10/10/2018 8:21 AM  RN Care Manager: 10/10/2018 8:21 AM  Social Worker: Baldo Daub, LCSWA 10/10/2018 8:21 AM  Recreational Therapist:  10/10/2018 8:21 AM  Other:  10/10/2018 8:21 AM  Other:  10/10/2018 8:21 AM  Other: 10/10/2018 8:21 AM    Scribe for Treatment Team: Maeola Sarah, LCSWA 10/10/2018 8:21 AM

## 2018-10-10 NOTE — Progress Notes (Signed)
D: Pt denies SI/HI/AVH. Pt is pleasant and cooperative. Pt irritable on approach, 1:1 time spent pt , pt appeared calm after talking. Pt seen pacing the unit therapeutically  .   A: Pt was offered support and encouragement. Pt was given scheduled medications. Pt was encourage to attend groups. Q 15 minute checks were done for safety.   R:Pt attends groups and interacts well with peers and staff. Pt is taking medication. Pt receptive to treatment and safety maintained on unit.   Problem: Education: Goal: Mental status will improve Outcome: Progressing   Problem: Activity: Goal: Sleeping patterns will improve Outcome: Progressing   Problem: Coping: Goal: Ability to verbalize frustrations and anger appropriately will improve Outcome: Progressing

## 2018-10-10 NOTE — Progress Notes (Cosign Needed)
DAR NOTE: Pt present with calm affect and pleasant  mood in the unit. Pt remains on droplet precaution, but as been requesting to mingle with peers in the day room. Pt has been up and down, could not sleep trazodone 50 mg and vistaril 25 mg given with good relief. Pt's safety ensured with 15 minute and environmental checks. Pt currently denies SI/HI and A/V hallucinations. Pt verbally agrees to seek staff if SI/HI or A/VH occurs and to consult with staff before acting on these thoughts. Will continue POC.

## 2018-10-10 NOTE — [facility] Group Notes (Signed)
Adult Psychoeducational Group Note  Date:  10/10/2018 Time:  9:24 PM  Group Topic/Focus:  Wrap-Up Group:   The focus of this group is to help patients review their daily goal of treatment and discuss progress on daily workbooks.  Participation Level:  Active  Participation Quality:  Appropriate  Affect:  Appropriate  Cognitive:  Appropriate  Insight: Appropriate  Engagement in Group:  Engaged  Modes of Intervention:  Discussion and Education  Additional Comments:  Pt attended and participated in wrap up group this evening and rated their day a 10/10. Pt completed their goal, to remain focused on their plan to not hurt themselves. Pt best learned coping skills are exercise, going to groups, coloring and having alone times.   Chrisandra Netters 10/10/2018, 9:24 PM

## 2018-10-11 ENCOUNTER — Telehealth (INDEPENDENT_AMBULATORY_CARE_PROVIDER_SITE_OTHER): Payer: Self-pay | Admitting: Family Medicine

## 2018-10-11 ENCOUNTER — Other Ambulatory Visit: Payer: Self-pay | Admitting: Physician Assistant

## 2018-10-11 ENCOUNTER — Other Ambulatory Visit: Payer: Self-pay

## 2018-10-11 DIAGNOSIS — R059 Cough, unspecified: Secondary | ICD-10-CM

## 2018-10-11 DIAGNOSIS — F3289 Other specified depressive episodes: Secondary | ICD-10-CM

## 2018-10-11 DIAGNOSIS — R05 Cough: Secondary | ICD-10-CM

## 2018-10-11 MED ORDER — ZIPRASIDONE MESYLATE 20 MG IM SOLR: 20.0000 mg | Freq: Four times a day (QID) | INTRAMUSCULAR | Status: DC | PRN

## 2018-10-11 MED ORDER — QUETIAPINE FUMARATE 100 MG PO TABS
100.0000 mg | ORAL_TABLET | ORAL | Status: DC
  Administered 2018-10-11: 100 mg via ORAL
  Filled 2018-10-11 (×2): qty 1

## 2018-10-11 MED ORDER — OXCARBAZEPINE 300 MG PO TABS
600.0000 mg | ORAL_TABLET | Freq: Two times a day (BID) | ORAL | Status: DC
  Administered 2018-10-11: 600 mg via ORAL
  Filled 2018-10-11 (×2): qty 2
  Filled 2018-10-11 (×2): qty 28
  Filled 2018-10-11: qty 2

## 2018-10-11 MED ORDER — OXCARBAZEPINE 600 MG PO TABS: 600.0000 mg | ORAL_TABLET | Freq: Two times a day (BID) | ORAL | 0 refills | Status: DC

## 2018-10-11 MED ORDER — TRAZODONE HCL 50 MG PO TABS: 50.0000 mg | ORAL_TABLET | Freq: Every evening | ORAL | 0 refills | Status: DC | PRN

## 2018-10-11 MED ORDER — NICOTINE 21 MG/24HR TD PT24: 21.0000 mg | MEDICATED_PATCH | Freq: Every day | TRANSDERMAL | 0 refills | Status: DC

## 2018-10-11 MED ORDER — TRAZODONE HCL 50 MG PO TABS
50.0000 mg | ORAL_TABLET | Freq: Every evening | ORAL | Status: DC | PRN
  Filled 2018-10-11: qty 7

## 2018-10-11 MED ORDER — HYDROXYZINE HCL 25 MG PO TABS: 25.0000 mg | ORAL_TABLET | Freq: Four times a day (QID) | ORAL | 0 refills | Status: DC | PRN

## 2018-10-11 MED ORDER — SERTRALINE HCL 50 MG PO TABS: 50.0000 mg | ORAL_TABLET | Freq: Every day | ORAL | 0 refills | Status: DC

## 2018-10-11 NOTE — Progress Notes (Signed)
[facility] Telemedicine Visit  Patient consented to have virtual visit. Method of visit: Video  Encounter participants: Patient: Kelly Carr - located at home Provider: Freddrick March - located at clinic Others (if applicable): N/A  Chief Complaint: cough, sore throat   HPI:  Cough/sore throat Patient with symptoms of COVID-19 and was asked to self-quarantine for 14 days.  She works in Engineering geologist at [employer] and was to be symptom free for 3 days.  She states she has still been having symptoms of sore throat, body aches and a dry cough.  She was discharged from [facility] today for a hospitalization for suicidal ideation.  She denies SI/HI since discharge and feels well from a mental health standpoint.  She was tested earlier today for flu and strep throat, both of which were negative.  She wants to know if she has COVID and desires testing at this time and was advised by her psychiatrist to follow up with our office.  She denies fevers but has had low grade temps of 99-100F.  Cough is nonproductive and she reports chills and sweats.  No shortness of breath or loss of smell/taste.  No h/o recent travel outside of the country.  She does not know if she has been in contact with a COVID positive but had been working at [employer] without PPE for some time did not have a mask or gloves.   ROS: per HPI  Pertinent PMHx: Bipolar I, depression, MDD   Exam:  General: pleasant 48 yo female, NAD  Respiratory: breathing comfortably, speaking in full sentences  Psych: appears to have normal mood and affect, no SI/HI  Assessment/Plan:  Cough/sore throat Desires COVID testing at this time due to ongoing symptoms.  Has already self-quarantined for 14 days. Current symptoms include cough, sore throat and body aches with negative flu and strep testing. Advised that she call [facility] Dept to have this done and phone number provided.  Patient will follow up  with our clinic if symptoms worsen.  Red flags reviewed.   Time spent during visit with patient: 15 minutes

## 2018-10-11 NOTE — Plan of Care (Signed)
Discharge note  Patient verbalizes readiness for discharge. Follow up plan explained, AVS, Transition record and SRA given. Prescriptions and teaching provided. Belongings returned and signed for. Suicide safety plan completed and signed. Patient verbalizes understanding. Patient denies SI/HI and assures this writer she will seek assistance should that change. Patient discharged to lobby where she was taking an uber.  Problem: Education: Goal: Knowledge of [facility] Education information/materials will improve Outcome: Adequate for Discharge Goal: Emotional status will improve Outcome: Adequate for Discharge Goal: Mental status will improve Outcome: Adequate for Discharge Goal: Verbalization of understanding the information provided will improve Outcome: Adequate for Discharge   Problem: Activity: Goal: Interest or engagement in activities will improve Outcome: Adequate for Discharge Goal: Sleeping patterns will improve Outcome: Adequate for Discharge   Problem: Coping: Goal: Ability to verbalize frustrations and anger appropriately will improve Outcome: Adequate for Discharge Goal: Ability to demonstrate self-control will improve Outcome: Adequate for Discharge   Problem: Health Behavior/Discharge Planning: Goal: Identification of resources available to assist in meeting health care needs will improve Outcome: Adequate for Discharge Goal: Compliance with treatment plan for underlying cause of condition will improve Outcome: Adequate for Discharge   Problem: Physical Regulation: Goal: Ability to maintain clinical measurements within normal limits will improve Outcome: Adequate for Discharge   Problem: Safety: Goal: Periods of time without injury will increase Outcome: Adequate for Discharge   Problem: Education: Goal: Ability to state activities that reduce stress will improve Outcome: Adequate for Discharge   Problem: Coping: Goal: Ability to identify and  develop effective coping behavior will improve Outcome: Adequate for Discharge   Problem: Self-Concept: Goal: Ability to identify factors that promote anxiety will improve Outcome: Adequate for Discharge Goal: Level of anxiety will decrease Outcome: Adequate for Discharge Goal: Ability to modify response to factors that promote anxiety will improve Outcome: Adequate for Discharge   Problem: Education: Goal: Utilization of techniques to improve thought processes will improve Outcome: Adequate for Discharge Goal: Knowledge of the prescribed therapeutic regimen will improve Outcome: Adequate for Discharge   Problem: Activity: Goal: Interest or engagement in leisure activities will improve Outcome: Adequate for Discharge Goal: Imbalance in normal sleep/wake cycle will improve Outcome: Adequate for Discharge   Problem: Coping: Goal: Coping ability will improve Outcome: Adequate for Discharge Goal: Will verbalize feelings Outcome: Adequate for Discharge   Problem: Health Behavior/Discharge Planning: Goal: Ability to make decisions will improve Outcome: Adequate for Discharge Goal: Compliance with therapeutic regimen will improve Outcome: Adequate for Discharge   Problem: Role Relationship: Goal: Will demonstrate positive changes in social behaviors and relationships Outcome: Adequate for Discharge   Problem: Safety: Goal: Ability to disclose and discuss suicidal ideas will improve Outcome: Adequate for Discharge Goal: Ability to identify and utilize support systems that promote safety will improve Outcome: Adequate for Discharge   Problem: Self-Concept: Goal: Will verbalize positive feelings about self Outcome: Adequate for Discharge Goal: Level of anxiety will decrease Outcome: Adequate for Discharge   Problem: Education: Goal: Ability to make informed decisions regarding treatment will improve Outcome: Adequate for Discharge   Problem: Coping: Goal: Coping ability  will improve Outcome: Adequate for Discharge   Problem: Health Behavior/Discharge Planning: Goal: Identification of resources available to assist in meeting health care needs will improve Outcome: Adequate for Discharge   Problem: Medication: Goal: Compliance with prescribed medication regimen will improve Outcome: Adequate for Discharge   Problem: Self-Concept: Goal: Ability to disclose and discuss suicidal ideas will improve Outcome: Adequate for Discharge Goal: Will  verbalize positive feelings about self Outcome: Adequate for Discharge

## 2018-10-11 NOTE — Progress Notes (Signed)
  [facility] Adult Case Management Discharge Plan :  Will you be returning to the same living situation after discharge:  Yes,  patient reports she is returning home At discharge, do you have transportation home?: Yes,  patient reports her son or daughter will pick her up at discharge Do you have the ability to pay for your medications: No.  Release of information consent forms completed and in the chart;  Patient's signature needed at discharge.  Patient to Follow up at: Follow-up Information    [facility] Follow up on 10/19/2018.   Specialty:  Behavioral Health Why:  Medication management appointment with Melony Overly is Friday, 5/15 at 11:00a.  The appointment will be held over the phone.  Office will call you 2 days prior to appointment.  Contact information: [address] [place] [address] [phone]        Misty Stanley Shelton-Therapist Follow up.   Why:  Patient states she would like to schedule her therapy appointment.  Please call and schedule a TeleMed therapy appointment after discharge.  Contact information: [address], [address] [place] [address] Ph: [phone] Fx: Currently Unavailable          Next level of care provider has access to [facility] Link:yes  Safety Planning and Suicide Prevention discussed: Yes,  with the patient   Have you used any form of tobacco in the last 30 days? (Cigarettes, Smokeless Tobacco, Cigars, and/or Pipes): Yes  Has patient been referred to the Quitline?: Patient refused referral  Patient has been referred for addiction treatment: N/A  Maeola Sarah, LCSWA 10/11/2018, 9:11 AM

## 2018-10-11 NOTE — Discharge Summary (Signed)
Physician Discharge Summary Note  Patient:  Kelly Carr is an 48 y.o., female MRN:  [id number] DOB:  1970-08-14 Patient phone:  [phone] (home)  Patient address:   [address] [place] [address],  Total Time spent with patient: 15 minutes  Date of Admission:  10/08/2018 Date of Discharge: 10/11/18  Reason for Admission:  suicidal ideation  Principal Problem: <principal problem not specified> Discharge Diagnoses: Active Problems:   MDD (major depressive disorder)   Past Psychiatric History: Per admission H&P: Patient has previous admissions for bipolar disorder.  She has been followed by an outpatient and treated with long-acting Abilify, Trileptal, sertraline and trazodone.  Past Medical History:  Past Medical History:  Diagnosis Date  . Abnormal Pap smear    Unknown results>colpo>normal  . Anxiety   . Arthritis   . Asthma   . Bipolar 1 disorder (HCC)   . Depression     Past Surgical History:  Procedure Laterality Date  . NO PAST SURGERIES     Family History:  Family History  Problem Relation Age of Onset  . Diabetes Mother   . Hypertension Mother   . Heart disease Mother 9  . Schizophrenia Mother   . Diabetes Maternal Grandmother   . Heart disease Maternal Grandmother   . Diabetes Maternal Grandfather   . Heart disease Maternal Grandfather   . Bipolar disorder Cousin   . Bipolar disorder Nephew   . Depression Daughter    Family Psychiatric  History: Denies Social History:  Social History   Substance and Sexual Activity  Alcohol Use Yes   Comment: "a lot"     Social History   Substance and Sexual Activity  Drug Use Not Currently    Social History   Socioeconomic History  . Marital status: Single    Spouse name: Not on file  . Number of children: Not on file  . Years of education: Not on file  . Highest education level: Not on file  Occupational History  . Not on file  Social Needs  . Financial resource strain: Not on file  .  Food insecurity:    Worry: Not on file    Inability: Not on file  . Transportation needs:    Medical: Not on file    Non-medical: Not on file  Tobacco Use  . Smoking status: Current Every Day Smoker    Packs/day: 1.50    Types: Cigarettes, E-cigarettes  . Smokeless tobacco: Never Used  Substance and Sexual Activity  . Alcohol use: Yes    Comment: "a lot"  . Drug use: Not Currently  . Sexual activity: Yes    Partners: Male    Birth control/protection: None  Lifestyle  . Physical activity:    Days per week: Not on file    Minutes per session: Not on file  . Stress: Not on file  Relationships  . Social connections:    Talks on phone: Not on file    Gets together: Not on file    Attends religious service: Not on file    Active member of club or organization: Not on file    Attends meetings of clubs or organizations: Not on file    Relationship status: Not on file  Other Topics Concern  . Not on file  Social History Narrative  . Not on file    Hospital Course:  From admission H&P: Patient is a 48 year old female with a past psychiatric history significant for bipolar disorder who presented to the [facility] emergency department on 10/08/2018 with suicidal ideation. The patient stated that she had a history of bipolar disorder, and was going to overdose on medications. She also stated that she had not had her medications in several months. She was also focused on a "sore throat" that she was fearful was something much more significant. She had last been seen by a primary care provider on 4/23 secondary to sore throat pain. This is been going on for some time. She was too paranoid to go to the outpatient clinic for lab test because of the coronavirus fears. She was last seen by the tele-evaluation team. She admitted the last time that she had had her Abilify and other medications was "several months ago. Her last psychiatric evaluation was in December 2019, and was taking the  long-acting Abilify injection, Trileptal, sertraline and was doing well. Her drug screen was positive for marijuana, her beta hCG was negative. Her white blood cell count is elevated at 15.3. She is mildly anemic with a hemoglobin of 11 and a hematocrit of 34.7. Her TSH was 2.058. She was pressured, agitated and what appeared to be significantly manic. She was admitted to the hospital for evaluation and stabilization.  [name] was admitted for suicidal ideation. Abilify, Zoloft, trazodone, and Vistaril were restarted. Trileptal was started. She received Abilify Maintena 400 mg IM on 10/09/18. She participated in group therapy on the unit. She complained of a sore throat. Influenza A and B both negative. Strep was negative. She was afebrile. She remained on the [facility] unit for 3 days. She stabilized with medication and therapy. She was discharged on the medications listed below. She has shown improvement with improved mood, affect, sleep, appetite, and interaction. She denies any SI/HI/AVH and contracts for safety. She agrees to follow up at [facility] and with therapist Thalia Bloodgood (see below). Patient is provided with prescriptions for medications upon discharge. Her family is picking her up for discharge home.  Physical Findings: AIMS: Facial and Oral Movements Muscles of Facial Expression: None, normal Lips and Perioral Area: None, normal Jaw: None, normal Tongue: None, normal,Extremity Movements Upper (arms, wrists, hands, fingers): None, normal Lower (legs, knees, ankles, toes): None, normal, Trunk Movements Neck, shoulders, hips: None, normal, Overall Severity Severity of abnormal movements (highest score from questions above): None, normal Incapacitation due to abnormal movements: None, normal Patient's awareness of abnormal movements (rate only patient's report): No Awareness, Dental Status Current problems with teeth and/or dentures?: No Does patient usually wear  dentures?: No  CIWA:    COWS:     Musculoskeletal: Strength & Muscle Tone: within normal limits Gait & Station: normal Patient leans: N/A  Psychiatric Specialty Exam: Physical Exam  Nursing note and vitals reviewed. Constitutional: She is oriented to person, place, and time. She appears well-developed and well-nourished.  Cardiovascular: Normal rate.  Respiratory: Effort normal.  Neurological: She is alert and oriented to person, place, and time.    Review of Systems  Constitutional: Negative.   Psychiatric/Behavioral: Positive for depression (improving) and substance abuse (UDS +THC). Negative for hallucinations and suicidal ideas. The patient is not nervous/anxious and does not have insomnia.     Blood pressure 133/84, pulse 90, temperature 98 F (36.7 C), temperature source Oral, height 5\' 5"  (1.651 m), weight 68.9 kg, last menstrual period 09/24/2018.Body mass index is 25.29 kg/m.  See MD's discharge SRA     Have you used any form of tobacco in the last 30 days? (Cigarettes, Smokeless Tobacco, Cigars, and/or  Pipes): Yes  Has this patient used any form of tobacco in the last 30 days? (Cigarettes, Smokeless Tobacco, Cigars, and/or Pipes) Yes, a prescription for an FDA-approved medication for tobacco cessation was offered at discharge.  Blood Alcohol level:  Lab Results  Component Value Date   ETH <10 10/08/2018   ETH <10 03/20/2018    Metabolic Disorder Labs:  Lab Results  Component Value Date   HGBA1C 5.0 10/09/2018   MPG 96.8 10/09/2018   MPG 96.8 03/21/2018   Lab Results  Component Value Date   PROLACTIN 39.5 (H) 04/12/2018   PROLACTIN 2.0 12/05/2011   Lab Results  Component Value Date   CHOL 215 (H) 10/09/2018   TRIG 83 10/09/2018   HDL 85 10/09/2018   CHOLHDL 2.5 10/09/2018   VLDL 17 10/09/2018   LDLCALC 113 (H) 10/09/2018   LDLCALC 118 (H) 03/21/2018    See Psychiatric Specialty Exam and Suicide Risk Assessment completed by Attending Physician prior  to discharge.  Discharge destination:  Home  Is patient on multiple antipsychotic therapies at discharge:  No   Has Patient had three or more failed trials of antipsychotic monotherapy by history:  No  Recommended Plan for Multiple Antipsychotic Therapies: NA  Discharge Instructions    Discharge instructions   Complete by:  As directed    Patient is instructed to take all prescribed medications as recommended. Report any side effects or adverse reactions to your outpatient psychiatrist. Patient is instructed to abstain from alcohol and illegal drugs while on prescription medications. In the event of worsening symptoms, patient is instructed to call the crisis hotline, 911, or go to the nearest emergency department for evaluation and treatment.     Allergies as of 10/11/2018      Reactions   Haldol [haloperidol Lactate] Other (See Comments)   Syncope   Risperidone And Related Other (See Comments)   Zones out   Tramadol Itching   Valproic Acid       Medication List    STOP taking these medications   cephALEXin 500 MG capsule Commonly known as:  Keflex   loratadine 10 MG tablet Commonly known as:  CLARITIN   pantoprazole 40 MG tablet Commonly known as:  PROTONIX     TAKE these medications     Indication  ARIPiprazole ER 400 MG Srer injection Commonly known as:  ABILIFY MAINTENA Inject 2 mLs (400 mg total) into the muscle every 28 (twenty-eight) days.  Indication:  MIXED BIPOLAR AFFECTIVE DISORDER   hydrOXYzine 25 MG tablet Commonly known as:  ATARAX/VISTARIL Take 1 tablet (25 mg total) by mouth every 6 (six) hours as needed for itching or anxiety. What changed:    medication strength  how much to take  when to take this  reasons to take this  Indication:  Feeling Anxious   nicotine 21 mg/24hr patch Commonly known as:  NICODERM CQ - dosed in mg/24 hours Place 1 patch (21 mg total) onto the skin daily. For smoking cessation Start taking on:  Oct 12, 2018   Indication:  Nicotine Addiction   oxcarbazepine 600 MG tablet Commonly known as:  TRILEPTAL Take 1 tablet (600 mg total) by mouth 2 (two) times daily. For mood What changed:    medication strength  how much to take  additional instructions  Indication:  Mood   sertraline 50 MG tablet Commonly known as:  ZOLOFT Take 1 tablet (50 mg total) by mouth daily.  Indication:  Posttraumatic Stress Disorder   traZODone 50  MG tablet Commonly known as:  DESYREL Take 1 tablet (50 mg total) by mouth at bedtime as needed for sleep. What changed:  how much to take  Indication:  Trouble Sleeping      Follow-up Information    [facility] Follow up on 10/19/2018.   Specialty:  Behavioral Health Why:  Medication management appointment with Melony Overlyeresa Hurst is Friday, 5/15 at 11:00a.  The appointment will be held over the phone.  Office will call you 2 days prior to appointment.  Contact information: [address] [place] [address] [phone]        Misty StanleyLisa Shelton-Therapist Follow up.   Why:  Patient states she would like to schedule her therapy appointment.  Please call and schedule a TeleMed therapy appointment after discharge.  Contact information: [address], [address] [place] [address] Ph: [phone] Fx: Currently Unavailable          Follow-up recommendations: Activity as tolerated. Diet as recommended by primary care physician. Keep all scheduled follow-up appointments as recommended.   Comments:   Patient is instructed to take all prescribed medications as recommended. Report any side effects or adverse reactions to your outpatient psychiatrist. Patient is instructed to abstain from alcohol and illegal drugs while on prescription medications. In the event of worsening symptoms, patient is instructed to call the crisis hotline, 911, or go to the nearest emergency department for evaluation and treatment.  Signed: Aldean BakerJanet E Shatora Weatherbee,  NP 10/11/2018, 9:55 AM

## 2018-10-11 NOTE — BHH Suicide Risk Assessment (Signed)
[facility] Discharge Suicide Risk Assessment   Principal Problem: <principal problem not specified> Discharge Diagnoses: Active Problems:   MDD (major depressive disorder)   Total Time spent with patient: 15 minutes  Musculoskeletal: Strength & Muscle Tone: within normal limits Gait & Station: normal Patient leans: N/A  Psychiatric Specialty Exam: Review of Systems  All other systems reviewed and are negative.   Blood pressure 133/84, pulse 90, temperature 98 F (36.7 C), temperature source Oral, height 5\' 5"  (1.651 m), weight 68.9 kg, last menstrual period 09/24/2018.Body mass index is 25.29 kg/m.  General Appearance: Casual  Eye Contact::  Good  Speech:  Normal Rate409  Volume:  Normal  Mood:  Irritable  Affect:  Labile  Thought Process:  Coherent and Descriptions of Associations: Intact  Orientation:  Full (Time, Place, and Person)  Thought Content:  Logical  Suicidal Thoughts:  No  Homicidal Thoughts:  No  Memory:  Immediate;   Fair Recent;   Fair Remote;   Fair  Judgement:  Intact  Insight:  Fair  Psychomotor Activity:  Increased  Concentration:  Fair  Recall:  [employer] of Knowledge:Fair  Language: Fair  Akathisia:  Negative  Handed:  Right  AIMS (if indicated):     Assets:  Desire for Improvement Social Support  Sleep:  Number of Hours: 5.75  Cognition: WNL  ADL's:  Intact   Mental Status Per Nursing Assessment::   On Admission:  NA  Demographic Factors:  Divorced or widowed, Caucasian, Low socioeconomic status and Unemployed  Loss Factors: NA  Historical Factors: Impulsivity  Risk Reduction Factors:   NA  Continued Clinical Symptoms:  Bipolar Disorder:   Mixed State  Cognitive Features That Contribute To Risk:  None    Suicide Risk:  Minimal: No identifiable suicidal ideation.  Patients presenting with no risk factors but with morbid ruminations; may be classified as minimal risk based on the severity of the depressive symptoms  Follow-up  Information    [facility] Follow up on 10/19/2018.   Specialty:  Behavioral Health Why:  Medication management appointment with Melony Overly is Friday, 5/15 at 11:00a.  The appointment will be held over the phone.  Office will call you 2 days prior to appointment.  Contact information: [address] [place] [address] [phone]        Misty Stanley Shelton-Therapist Follow up.   Why:  Patient states she would like to schedule her therapy appointment.  Please call and schedule a TeleMed therapy appointment after discharge.  Contact information: [address], [address] [place] [address] Ph: [phone] Fx: Currently Unavailable          Plan Of Care/Follow-up recommendations:  Activity:  ad lib  Antonieta Pert, MD 10/11/2018, 7:50 AM

## 2018-10-12 LAB — CULTURE, GROUP A STREP (THRC)

## 2018-10-14 ENCOUNTER — Other Ambulatory Visit: Payer: Self-pay

## 2018-10-14 ENCOUNTER — Encounter (HOSPITAL_COMMUNITY): Payer: Self-pay | Admitting: Emergency Medicine

## 2018-10-14 ENCOUNTER — Emergency Department (HOSPITAL_COMMUNITY): Payer: Self-pay

## 2018-10-14 ENCOUNTER — Emergency Department (HOSPITAL_COMMUNITY)
Admission: EM | Admit: 2018-10-14 | Discharge: 2018-10-14 | Disposition: A | Discharge status: Home / Self Care | Payer: Self-pay | Attending: Emergency Medicine | Admitting: Emergency Medicine

## 2018-10-14 DIAGNOSIS — R079 Chest pain, unspecified: Secondary | ICD-10-CM | POA: Insufficient documentation

## 2018-10-14 DIAGNOSIS — F17290 Nicotine dependence, other tobacco product, uncomplicated: Secondary | ICD-10-CM | POA: Insufficient documentation

## 2018-10-14 DIAGNOSIS — J45909 Unspecified asthma, uncomplicated: Secondary | ICD-10-CM | POA: Insufficient documentation

## 2018-10-14 DIAGNOSIS — S0285XA Fracture of orbit, unspecified, initial encounter for closed fracture: Secondary | ICD-10-CM

## 2018-10-14 DIAGNOSIS — Y999 Unspecified external cause status: Secondary | ICD-10-CM | POA: Insufficient documentation

## 2018-10-14 DIAGNOSIS — Y939 Activity, unspecified: Secondary | ICD-10-CM | POA: Insufficient documentation

## 2018-10-14 DIAGNOSIS — R102 Pelvic and perineal pain: Secondary | ICD-10-CM | POA: Insufficient documentation

## 2018-10-14 DIAGNOSIS — S0232XA Fracture of orbital floor, left side, initial encounter for closed fracture: Secondary | ICD-10-CM | POA: Insufficient documentation

## 2018-10-14 DIAGNOSIS — Y929 Unspecified place or not applicable: Secondary | ICD-10-CM | POA: Insufficient documentation

## 2018-10-14 DIAGNOSIS — S0012XA Contusion of left eyelid and periocular area, initial encounter: Secondary | ICD-10-CM | POA: Insufficient documentation

## 2018-10-14 DIAGNOSIS — S0011XA Contusion of right eyelid and periocular area, initial encounter: Secondary | ICD-10-CM | POA: Insufficient documentation

## 2018-10-14 DIAGNOSIS — S0083XA Contusion of other part of head, initial encounter: Secondary | ICD-10-CM

## 2018-10-14 DIAGNOSIS — F17210 Nicotine dependence, cigarettes, uncomplicated: Secondary | ICD-10-CM | POA: Insufficient documentation

## 2018-10-14 DIAGNOSIS — Z79899 Other long term (current) drug therapy: Secondary | ICD-10-CM | POA: Insufficient documentation

## 2018-10-14 LAB — COMPREHENSIVE METABOLIC PANEL
ALT: 34 U/L (ref 0–44)
AST: 35 U/L (ref 15–41)
Albumin: 3.9 g/dL (ref 3.5–5.0)
Alkaline Phosphatase: 84 U/L (ref 38–126)
Anion gap: 11 (ref 5–15)
BUN: 7 mg/dL (ref 6–20)
CO2: 24 mmol/L (ref 22–32)
Calcium: 9.2 mg/dL (ref 8.9–10.3)
Chloride: 102 mmol/L (ref 98–111)
Creatinine, Ser: 0.58 mg/dL (ref 0.44–1.00)
GFR calc Af Amer: >60 mL/min (ref >60)
GFR calc non Af Amer: >60 mL/min (ref >60)
Glucose, Bld: 105 mg/dL — ABNORMAL HIGH (ref 70–99)
Potassium: 3.8 mmol/L (ref 3.5–5.1)
Sodium: 137 mmol/L (ref 135–145)
Total Bilirubin: 0.8 mg/dL (ref 0.3–1.2)
Total Protein: 7 g/dL (ref 6.5–8.1)

## 2018-10-14 LAB — RAPID URINE DRUG SCREEN, HOSP PERFORMED
Amphetamines: NOT DETECTED
Barbiturates: NOT DETECTED
Benzodiazepines: NOT DETECTED
Cocaine: NOT DETECTED
Opiates: NOT DETECTED
Tetrahydrocannabinol: POSITIVE — AB

## 2018-10-14 LAB — CBC
HCT: 38 % (ref 36.0–46.0)
Hemoglobin: 11.8 g/dL — ABNORMAL LOW (ref 12.0–15.0)
MCH: 28.1 pg (ref 26.0–34.0)
MCHC: 31.1 g/dL (ref 30.0–36.0)
MCV: 90.5 fL (ref 80.0–100.0)
Platelets: 318 10*3/uL (ref 150–400)
RBC: 4.2 MIL/uL (ref 3.87–5.11)
RDW: 13.2 % (ref 11.5–15.5)
WBC: 10.9 10*3/uL — ABNORMAL HIGH (ref 4.0–10.5)
nRBC: 0 % (ref 0.0–0.2)

## 2018-10-14 LAB — ETHANOL: Alcohol, Ethyl (B): <10 mg/dL (ref <10)

## 2018-10-14 LAB — I-STAT BETA HCG BLOOD, ED (MC, WL, AP ONLY): I-stat hCG, quantitative: <5 m[IU]/mL (ref <5)

## 2018-10-14 MED ORDER — IOHEXOL 300 MG/ML  SOLN
100.0000 mL | Freq: Once | INTRAMUSCULAR | Status: AC | PRN
  Administered 2018-10-14: 100 mL via INTRAVENOUS

## 2018-10-14 MED ORDER — KETOROLAC TROMETHAMINE 30 MG/ML IJ SOLN
15.0000 mg | Freq: Once | INTRAMUSCULAR | Status: AC
  Administered 2018-10-14: 15 mg via INTRAVENOUS
  Filled 2018-10-14: qty 1

## 2018-10-14 MED ORDER — FENTANYL CITRATE (PF) 100 MCG/2ML IJ SOLN
50.0000 ug | Freq: Once | INTRAMUSCULAR | Status: AC
  Administered 2018-10-14: 50 ug via INTRAVENOUS
  Filled 2018-10-14: qty 2
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
## 2018-10-14 NOTE — ED Notes (Signed)
Patient transported to CT 
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
## 2018-10-14 NOTE — ED Notes (Signed)
Patient verbalizes understanding of discharge instructions. Opportunity for questioning and answers were provided. Armband removed by staff, pt discharged from ED via wheelchair to home.  

## 2018-10-14 NOTE — ED Notes (Signed)
Patient removed all monitoring equipment, continues to ramble and seems very fidgety.

## 2018-10-15 ENCOUNTER — Other Ambulatory Visit: Payer: Self-pay

## 2018-10-15 ENCOUNTER — Encounter: Payer: Self-pay | Admitting: Emergency Medicine

## 2018-10-15 ENCOUNTER — Emergency Department: Payer: Self-pay

## 2018-10-15 ENCOUNTER — Emergency Department
Admission: EM | Admit: 2018-10-15 | Discharge: 2018-10-15 | Disposition: A | Discharge status: Home / Self Care | Payer: Self-pay | Attending: Emergency Medicine | Admitting: Emergency Medicine

## 2018-10-15 ENCOUNTER — Emergency Department
Admission: EM | Admit: 2018-10-15 | Discharge: 2018-10-16 | Disposition: A | Discharge status: Other Acute Inpatient Hospital | Payer: No Typology Code available for payment source | Attending: Student in an Organized Health Care Education/Training Program | Admitting: Student in an Organized Health Care Education/Training Program

## 2018-10-15 DIAGNOSIS — F17210 Nicotine dependence, cigarettes, uncomplicated: Secondary | ICD-10-CM | POA: Insufficient documentation

## 2018-10-15 DIAGNOSIS — F319 Bipolar disorder, unspecified: Secondary | ICD-10-CM | POA: Insufficient documentation

## 2018-10-15 DIAGNOSIS — J45909 Unspecified asthma, uncomplicated: Secondary | ICD-10-CM | POA: Insufficient documentation

## 2018-10-15 DIAGNOSIS — Z1159 Encounter for screening for other viral diseases: Secondary | ICD-10-CM | POA: Insufficient documentation

## 2018-10-15 DIAGNOSIS — Z79899 Other long term (current) drug therapy: Secondary | ICD-10-CM | POA: Insufficient documentation

## 2018-10-15 DIAGNOSIS — F17290 Nicotine dependence, other tobacco product, uncomplicated: Secondary | ICD-10-CM | POA: Insufficient documentation

## 2018-10-15 DIAGNOSIS — R0789 Other chest pain: Secondary | ICD-10-CM | POA: Insufficient documentation

## 2018-10-15 DIAGNOSIS — R451 Restlessness and agitation: Secondary | ICD-10-CM

## 2018-10-15 DIAGNOSIS — F22 Delusional disorders: Secondary | ICD-10-CM | POA: Insufficient documentation

## 2018-10-15 LAB — CBC
HCT: 32.8 % — ABNORMAL LOW (ref 36.0–46.0)
HCT: 33.9 % — ABNORMAL LOW (ref 36.0–46.0)
Hemoglobin: 10.3 g/dL — ABNORMAL LOW (ref 12.0–15.0)
Hemoglobin: 11 g/dL — ABNORMAL LOW (ref 12.0–15.0)
MCH: 28 pg (ref 26.0–34.0)
MCH: 28.6 pg (ref 26.0–34.0)
MCHC: 31.4 g/dL (ref 30.0–36.0)
MCHC: 32.4 g/dL (ref 30.0–36.0)
MCV: 89.1 fL (ref 80.0–100.0)
MCV: 88.1 fL (ref 80.0–100.0)
Platelets: 301 10*3/uL (ref 150–400)
Platelets: 338 10*3/uL (ref 150–400)
RBC: 3.68 MIL/uL — ABNORMAL LOW (ref 3.87–5.11)
RBC: 3.85 MIL/uL — ABNORMAL LOW (ref 3.87–5.11)
RDW: 13.2 % (ref 11.5–15.5)
RDW: 13.2 % (ref 11.5–15.5)
WBC: 9.2 10*3/uL (ref 4.0–10.5)
WBC: 10 10*3/uL (ref 4.0–10.5)
nRBC: 0 % (ref 0.0–0.2)
nRBC: 0 % (ref 0.0–0.2)

## 2018-10-15 LAB — COMPREHENSIVE METABOLIC PANEL
ALT: 30 U/L (ref 0–44)
ALT: 34 U/L (ref 0–44)
AST: 31 U/L (ref 15–41)
AST: 37 U/L (ref 15–41)
Albumin: 3.7 g/dL (ref 3.5–5.0)
Albumin: 4.1 g/dL (ref 3.5–5.0)
Alkaline Phosphatase: 65 U/L (ref 38–126)
Alkaline Phosphatase: 73 U/L (ref 38–126)
Anion gap: 9 (ref 5–15)
Anion gap: 8 (ref 5–15)
BUN: 6 mg/dL (ref 6–20)
BUN: 7 mg/dL (ref 6–20)
CO2: 25 mmol/L (ref 22–32)
CO2: 25 mmol/L (ref 22–32)
Calcium: 8.4 mg/dL — ABNORMAL LOW (ref 8.9–10.3)
Calcium: 8.9 mg/dL (ref 8.9–10.3)
Chloride: 105 mmol/L (ref 98–111)
Chloride: 106 mmol/L (ref 98–111)
Creatinine, Ser: 0.48 mg/dL (ref 0.44–1.00)
Creatinine, Ser: 0.47 mg/dL (ref 0.44–1.00)
GFR calc Af Amer: >60 mL/min (ref >60)
GFR calc Af Amer: >60 mL/min (ref >60)
GFR calc non Af Amer: >60 mL/min (ref >60)
GFR calc non Af Amer: >60 mL/min (ref >60)
Glucose, Bld: 95 mg/dL (ref 70–99)
Glucose, Bld: 95 mg/dL (ref 70–99)
Potassium: 3.3 mmol/L — ABNORMAL LOW (ref 3.5–5.1)
Potassium: 3.6 mmol/L (ref 3.5–5.1)
Sodium: 139 mmol/L (ref 135–145)
Sodium: 139 mmol/L (ref 135–145)
Total Bilirubin: 0.5 mg/dL (ref 0.3–1.2)
Total Bilirubin: 0.7 mg/dL (ref 0.3–1.2)
Total Protein: 6.3 g/dL — ABNORMAL LOW (ref 6.5–8.1)
Total Protein: 7.2 g/dL (ref 6.5–8.1)

## 2018-10-15 LAB — TROPONIN I: Troponin I: <0.03 ng/mL (ref <0.03)

## 2018-10-15 LAB — ETHANOL: Alcohol, Ethyl (B): <10 mg/dL (ref <10)

## 2018-10-15 LAB — POCT PREGNANCY, URINE: Preg Test, Ur: NEGATIVE

## 2018-10-15 LAB — ACETAMINOPHEN LEVEL: Acetaminophen (Tylenol), Serum: <10 ug/mL — ABNORMAL LOW (ref 10–30)

## 2018-10-15 LAB — SALICYLATE LEVEL: Salicylate Lvl: <7 mg/dL (ref 2.8–30.0)

## 2018-10-15 MED ORDER — ZIPRASIDONE MESYLATE 20 MG IM SOLR
20.0000 mg | Freq: Once | INTRAMUSCULAR | Status: AC
  Administered 2018-10-15: 20 mg via INTRAMUSCULAR
  Filled 2018-10-15: qty 20

## 2018-10-15 MED ORDER — LORAZEPAM 2 MG PO TABS
2.0000 mg | ORAL_TABLET | Freq: Once | ORAL | Status: AC
  Administered 2018-10-15: 2 mg via ORAL
  Filled 2018-10-15: qty 1
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
## 2018-10-15 NOTE — ED Notes (Signed)
Report to include Situation, Background, Assessment, and Recommendations received from [facility]. Patient sleeping, in no acute distress. Q15 minute rounds and Psychologist, counselling presence for their safety.
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
## 2018-10-15 NOTE — ED Notes (Signed)
Pt requesting SW consult for finding a way to get back to her home. Charge RN directed primary RN to call SW who states they will speak with nurse case manager and return call.
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
## 2018-10-15 NOTE — ED Notes (Signed)

## 2018-10-16 ENCOUNTER — Other Ambulatory Visit: Payer: Self-pay

## 2018-10-16 ENCOUNTER — Inpatient Hospital Stay
Admission: AD | Admit: 2018-10-16 | Discharge: 2018-10-22 | DRG: 885 | Disposition: A | Discharge status: Home / Self Care | Payer: No Typology Code available for payment source | Attending: Psychiatry | Admitting: Psychiatry

## 2018-10-16 DIAGNOSIS — N393 Stress incontinence (female) (male): Secondary | ICD-10-CM | POA: Diagnosis present

## 2018-10-16 DIAGNOSIS — F17210 Nicotine dependence, cigarettes, uncomplicated: Secondary | ICD-10-CM | POA: Diagnosis present

## 2018-10-16 DIAGNOSIS — F3112 Bipolar disorder, current episode manic without psychotic features, moderate: Secondary | ICD-10-CM | POA: Diagnosis present

## 2018-10-16 DIAGNOSIS — E785 Hyperlipidemia, unspecified: Secondary | ICD-10-CM | POA: Diagnosis present

## 2018-10-16 DIAGNOSIS — K5900 Constipation, unspecified: Secondary | ICD-10-CM | POA: Diagnosis present

## 2018-10-16 DIAGNOSIS — F312 Bipolar disorder, current episode manic severe with psychotic features: Secondary | ICD-10-CM

## 2018-10-16 DIAGNOSIS — S0230XA Fracture of orbital floor, unspecified side, initial encounter for closed fracture: Secondary | ICD-10-CM | POA: Diagnosis present

## 2018-10-16 DIAGNOSIS — K219 Gastro-esophageal reflux disease without esophagitis: Secondary | ICD-10-CM | POA: Diagnosis present

## 2018-10-16 DIAGNOSIS — S0083XA Contusion of other part of head, initial encounter: Secondary | ICD-10-CM | POA: Diagnosis present

## 2018-10-16 LAB — URINE DRUG SCREEN, QUALITATIVE (ARMC ONLY)
Amphetamines, Ur Screen: NOT DETECTED
Barbiturates, Ur Screen: NOT DETECTED
Benzodiazepine, Ur Scrn: NOT DETECTED
Cannabinoid 50 Ng, Ur ~~LOC~~: POSITIVE — AB
Cocaine Metabolite,Ur ~~LOC~~: NOT DETECTED
MDMA (Ecstasy)Ur Screen: NOT DETECTED
Methadone Scn, Ur: NOT DETECTED
Opiate, Ur Screen: NOT DETECTED
Phencyclidine (PCP) Ur S: NOT DETECTED
Tricyclic, Ur Screen: NOT DETECTED

## 2018-10-16 LAB — SARS CORONAVIRUS 2 BY RT PCR (HOSPITAL ORDER, PERFORMED IN ~~LOC~~ HOSPITAL LAB): SARS Coronavirus 2: NEGATIVE

## 2018-10-16 MED ORDER — HYDROXYZINE HCL 50 MG PO TABS
50.0000 mg | ORAL_TABLET | Freq: Four times a day (QID) | ORAL | Status: DC | PRN
  Administered 2018-10-17: 50 mg via ORAL
  Filled 2018-10-16: qty 1

## 2018-10-16 MED ORDER — LORAZEPAM 2 MG/ML IJ SOLN: 2.0000 mg | Freq: Every day | INTRAMUSCULAR | Status: DC | PRN

## 2018-10-16 MED ORDER — HYDROXYZINE HCL 25 MG PO TABS: 25.0000 mg | ORAL_TABLET | Freq: Four times a day (QID) | ORAL | Status: DC | PRN

## 2018-10-16 MED ORDER — OXCARBAZEPINE 300 MG PO TABS
600.0000 mg | ORAL_TABLET | Freq: Two times a day (BID) | ORAL | Status: DC
  Administered 2018-10-17 – 2018-10-21 (×9): 600 mg via ORAL
  Filled 2018-10-16 (×9): qty 2

## 2018-10-16 MED ORDER — HYDROXYZINE HCL 25 MG PO TABS: 50.0000 mg | ORAL_TABLET | Freq: Four times a day (QID) | ORAL | Status: DC | PRN

## 2018-10-16 MED ORDER — MAGNESIUM HYDROXIDE 400 MG/5ML PO SUSP
30.0000 mL | Freq: Every day | ORAL | Status: DC | PRN
  Administered 2018-10-19 – 2018-10-21 (×3): 30 mL via ORAL
  Filled 2018-10-16 (×3): qty 30

## 2018-10-16 MED ORDER — NICOTINE 21 MG/24HR TD PT24
21.0000 mg | MEDICATED_PATCH | Freq: Every day | TRANSDERMAL | Status: DC
  Administered 2018-10-17 – 2018-10-22 (×6): 21 mg via TRANSDERMAL
  Filled 2018-10-16 (×6): qty 1

## 2018-10-16 MED ORDER — ARIPIPRAZOLE ER 400 MG IM SRER: 400.0000 mg | INTRAMUSCULAR | Status: DC

## 2018-10-16 MED ORDER — NICOTINE 21 MG/24HR TD PT24
21.0000 mg | MEDICATED_PATCH | Freq: Every day | TRANSDERMAL | Status: DC
  Administered 2018-10-16: 21 mg via TRANSDERMAL
  Filled 2018-10-16: qty 1

## 2018-10-16 MED ORDER — SERTRALINE HCL 50 MG PO TABS
50.0000 mg | ORAL_TABLET | Freq: Every day | ORAL | Status: DC
  Administered 2018-10-16: 50 mg via ORAL
  Filled 2018-10-16: qty 1

## 2018-10-16 MED ORDER — ACETAMINOPHEN 325 MG PO TABS
650.0000 mg | ORAL_TABLET | Freq: Four times a day (QID) | ORAL | Status: DC | PRN
  Administered 2018-10-18 – 2018-10-21 (×4): 650 mg via ORAL
  Filled 2018-10-16 (×4): qty 2

## 2018-10-16 MED ORDER — ALUM & MAG HYDROXIDE-SIMETH 200-200-20 MG/5ML PO SUSP
30.0000 mL | ORAL | Status: DC | PRN
  Administered 2018-10-21: 30 mL via ORAL
  Filled 2018-10-16: qty 30

## 2018-10-16 MED ORDER — LORAZEPAM 2 MG PO TABS
2.0000 mg | ORAL_TABLET | Freq: Every day | ORAL | Status: DC | PRN
  Administered 2018-10-16: 2 mg via ORAL
  Filled 2018-10-16: qty 1

## 2018-10-16 MED ORDER — ZIPRASIDONE MESYLATE 20 MG IM SOLR
20.0000 mg | Freq: Once | INTRAMUSCULAR | Status: AC
  Administered 2018-10-16: 20 mg via INTRAMUSCULAR
  Filled 2018-10-16: qty 20

## 2018-10-16 MED ORDER — LORAZEPAM 1 MG PO TABS
1.0000 mg | ORAL_TABLET | ORAL | Status: AC | PRN
  Administered 2018-10-17: 1 mg via ORAL
  Filled 2018-10-16: qty 1

## 2018-10-16 MED ORDER — OXCARBAZEPINE 300 MG PO TABS
600.0000 mg | ORAL_TABLET | Freq: Two times a day (BID) | ORAL | Status: DC
  Administered 2018-10-16 (×2): 600 mg via ORAL
  Filled 2018-10-16 (×2): qty 2

## 2018-10-16 MED ORDER — ACETAMINOPHEN 500 MG PO TABS
ORAL_TABLET | ORAL | Status: AC
  Filled 2018-10-16: qty 2

## 2018-10-16 MED ORDER — IBUPROFEN 600 MG PO TABS
600.0000 mg | ORAL_TABLET | Freq: Four times a day (QID) | ORAL | Status: DC | PRN
  Administered 2018-10-17 (×2): 600 mg via ORAL
  Filled 2018-10-16 (×3): qty 1

## 2018-10-16 MED ORDER — TRAZODONE HCL 50 MG PO TABS
50.0000 mg | ORAL_TABLET | Freq: Every evening | ORAL | Status: DC | PRN
  Administered 2018-10-17 – 2018-10-21 (×4): 50 mg via ORAL
  Filled 2018-10-16 (×4): qty 1

## 2018-10-16 MED ORDER — ACETAMINOPHEN 500 MG PO TABS
1000.0000 mg | ORAL_TABLET | Freq: Once | ORAL | Status: AC
  Administered 2018-10-16: 1000 mg via ORAL
  Filled 2018-10-16: qty 2

## 2018-10-16 MED ORDER — ZIPRASIDONE MESYLATE 20 MG IM SOLR: 20.0000 mg | INTRAMUSCULAR | Status: DC | PRN

## 2018-10-16 MED ORDER — ACETAMINOPHEN 500 MG PO TABS
1000.0000 mg | ORAL_TABLET | Freq: Four times a day (QID) | ORAL | Status: DC | PRN
  Administered 2018-10-20: 1000 mg via ORAL
  Filled 2018-10-16 (×2): qty 2

## 2018-10-16 MED ORDER — OLANZAPINE 5 MG PO TBDP: 10.0000 mg | ORAL_TABLET | Freq: Three times a day (TID) | ORAL | Status: DC | PRN

## 2018-10-16 MED ORDER — ARIPIPRAZOLE 5 MG PO TABS
5.0000 mg | ORAL_TABLET | Freq: Once | ORAL | Status: DC
  Administered 2018-10-16: 5 mg via ORAL
  Filled 2018-10-16 (×2): qty 1

## 2018-10-16 MED ORDER — ARIPIPRAZOLE 5 MG PO TABS
5.0000 mg | ORAL_TABLET | Freq: Every day | ORAL | Status: DC
  Administered 2018-10-17: 5 mg via ORAL
  Filled 2018-10-16: qty 1

## 2018-10-16 MED ORDER — SERTRALINE HCL 25 MG PO TABS
50.0000 mg | ORAL_TABLET | Freq: Every day | ORAL | Status: DC
  Administered 2018-10-17 – 2018-10-22 (×6): 50 mg via ORAL
  Filled 2018-10-16 (×6): qty 2

## 2018-10-16 MED ORDER — TRAZODONE HCL 50 MG PO TABS: 50.0000 mg | ORAL_TABLET | Freq: Every evening | ORAL | Status: DC | PRN

## 2018-10-16 MED ORDER — ACETAMINOPHEN 500 MG PO TABS
1000.0000 mg | ORAL_TABLET | Freq: Once | ORAL | Status: AC
  Administered 2018-10-16: 1000 mg via ORAL

## 2018-10-16 NOTE — ED Notes (Signed)
Hourly rounding reveals patient sleeping in room. No complaints, stable, in no acute distress. Q15 minute rounds and monitoring via Rover and Officer to continue.  

## 2018-10-16 NOTE — ED Notes (Signed)
She can be heard through the ED - yelling - screaming  - "tell that doctor that ordered that shot to order me a drink - I know how mental hospitals work - I am going to need a straight jacket cause ain't no damn shot gonna stop me  - Fuck this place - I will go to [facility] next time"

## 2018-10-16 NOTE — ED Notes (Signed)
Pt given a cup of ice
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
## 2018-10-16 NOTE — BH Assessment (Signed)
Patient is to be admitted to [facility] by Dr. Viviano Simas.  Attending Physician will be Dr. Toni Amend.   Patient has been assigned to room 315, by [facility] Charge Nurse Lillette Boxer   ER staff is aware of the admission:  Misty Stanley, ER Secretary    Dr. Darnelle Catalan, ER MD   Amy B., Patient's Nurse   Sharmon Leyden, Patient Access.

## 2018-10-16 NOTE — ED Notes (Signed)
Gave two cups of water to patient per her request. She keeps asking for the social worker and the Doctor.

## 2018-10-16 NOTE — ED Notes (Signed)
Pt asleep, breakfast tray placed on sink in rm.  
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
## 2018-10-16 NOTE — ED Notes (Signed)
She can still be heard talking loudly from inside her room   - pt does still have a ponytail holder in place - unclear why it was not removed in triage
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
## 2018-10-16 NOTE — ED Notes (Signed)
Pt given PRN Ativan PO per patient request. Pt remains cooperative. Maintained on 15 minute checks and observation by security for safety.

## 2018-10-16 NOTE — ED Notes (Signed)
She has now come to the door requesting chap stick  - moisturizer in a paper medicine cup to be provided

## 2018-10-16 NOTE — ED Notes (Signed)
Pt was given the phone at 5:15.

## 2018-10-16 NOTE — ED Notes (Signed)
Hourly rounding reveals patient in room. No complaints, stable, in no acute distress. Q15 minute rounds and monitoring via Rover and Officer to continue.   

## 2018-10-16 NOTE — ED Notes (Signed)
BEHAVIORAL HEALTH ROUNDING Patient sleeping: No. Patient alert and oriented: yes Behavior appropriate:   ; If no, describe:  Nutrition and fluids offered: yes Toileting and hygiene offered: Yes  Sitter present: q15 minute observations and security monitoring Law enforcement present: Yes  ODS  

## 2018-10-16 NOTE — ED Notes (Signed)

## 2018-10-16 NOTE — ED Notes (Signed)
ED  Is the patient under IVC or is there intent for IVC: Yes.   Is the patient medically cleared: Yes.   Is there vacancy in the ED BHU: Yes.   Unit closed Is the population mix appropriate for patient: Yes.   Is the patient awaiting placement in inpatient or outpatient setting:   Has the patient had a psychiatric consult:  Consult pending  Survey of unit performed for contraband, proper placement and condition of furniture, tampering with fixtures in bathroom, shower, and each patient room: Yes.  ; Findings:  APPEARANCE/BEHAVIOR cooperative NEURO ASSESSMENT Orientation: oriented x3  Denies pain Hallucinations:  Speech: Normal  - loud to yelling at times Gait: normal RESPIRATORY ASSESSMENT Even  Unlabored respirations  CARDIOVASCULAR ASSESSMENT Pulses equal   regular rate  Skin warm and dry   GASTROINTESTINAL ASSESSMENT no GI complaint EXTREMITIES Full ROM  PLAN OF CARE Provide calm/safe environment. Vital signs assessed twice daily. ED BHU Assessment once each 12-hour shift. Collaborate with TTS when available  or as condition indicates. Assure the ED provider has rounded once each shift. Provide and encourage hygiene. Provide redirection as needed. Assess for escalating behavior; address immediately and inform ED provider.  Assess family dynamic and appropriateness for visitation as needed: Yes.  ; If necessary, describe findings:  Educate the patient/family about BHU procedures/visitation: Yes.  ; If necessary, describe findings:

## 2018-10-16 NOTE — ED Notes (Signed)
Pt talking loudly in the hallway   Appropriate to stimulation  No verbalized needs or concerns at this time  NAD assessed  Continue to monitor
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
## 2018-10-16 NOTE — ED Notes (Signed)
IVC/  PENDING  PLACEMENT 

## 2018-10-16 NOTE — ED Notes (Signed)
Report to include Situation, Background, Assessment, and Recommendations received from Amy B. RN. Patient alert and oriented, warm and dry, in no acute distress. Patient denies SI, HI, AVH and pain. Patient made aware of Q15 minute rounds and Rover and Officer presence for their safety. Patient instructed to come to me with needs or concerns.  

## 2018-10-16 NOTE — ED Provider Notes (Signed)
-----------------------------------------   7:06 AM on 10/16/2018 -----------------------------------------   Blood pressure 95/60, pulse 70, temperature 98.2 F (36.8 C), temperature source Oral, resp. rate 16, height 1.626 m (5\' 4" ), weight 72.6 kg, last menstrual period 09/24/2018, SpO2 98 %.  The patient is calm and cooperative at this time.  There have been no acute events since the last update.  Awaiting disposition plan from Behavioral Medicine team.   Loleta Rose, MD 10/16/18 [phone]

## 2018-10-17 ENCOUNTER — Other Ambulatory Visit: Payer: Self-pay | Admitting: Family Medicine

## 2018-10-17 DIAGNOSIS — S0230XA Fracture of orbital floor, unspecified side, initial encounter for closed fracture: Secondary | ICD-10-CM

## 2018-10-17 HISTORY — DX: Fracture of orbital floor, unspecified side, initial encounter for closed fracture: S02.30XA

## 2018-10-17 MED ORDER — IBUPROFEN 600 MG PO TABS
800.0000 mg | ORAL_TABLET | Freq: Three times a day (TID) | ORAL | Status: DC | PRN
  Administered 2018-10-19 – 2018-10-22 (×5): 800 mg via ORAL
  Filled 2018-10-17 (×5): qty 1

## 2018-10-17 MED ORDER — ARIPIPRAZOLE 10 MG PO TABS
20.0000 mg | ORAL_TABLET | Freq: Every day | ORAL | Status: DC
  Administered 2018-10-18 – 2018-10-22 (×5): 20 mg via ORAL
  Filled 2018-10-17 (×5): qty 2

## 2018-10-17 MED ORDER — OXYCODONE HCL 5 MG PO TABS
5.0000 mg | ORAL_TABLET | Freq: Four times a day (QID) | ORAL | Status: DC | PRN
  Administered 2018-10-17 – 2018-10-19 (×5): 5 mg via ORAL
  Filled 2018-10-17 (×5): qty 1

## 2018-10-17 NOTE — Progress Notes (Signed)
Patient complained of generalized pain 7/10 , administered PrN  Advil 600 mg  Po for pain noted.Kelly Carr

## 2018-10-17 NOTE — Progress Notes (Signed)
Recreation Therapy Notes  Date: 10/17/2018  Time: 9:30 am  Location: Craft room  Behavioral response: Appropriate  Intervention Topic: Coping-Skill  Discussion/Intervention:  Group content on today was focused on coping skills. The group defined what coping skills are and when they can be used. Individuals described how they normally cope with thing and the coping skills they normally use. Patients expressed why it is important to cope with things and how not coping with things can affect you. The group participated in the intervention "My coping box" and made coping boxes while adding coping skills they could use in the future to the box.   Clinical Observations/Feedback:  Patient came to group and defined coping skills as ways to deal with anger. She identified music, singing and playing the piano as coping skills she uses. Participant explained that it is important to cope with things so there can be a release. She reflected on her previous suicide attempt expressing how she was not coping with things and she felt hopeless. Individual was social with staff while engaging in the intervention. [name] LRT/CTRS         Kelly Carr 10/17/2018 12:39 PM

## 2018-10-17 NOTE — BHH Group Notes (Signed)
LCSW Group Therapy Note  10/17/2018 1:00 PM  Type of Therapy/Topic:  Group Therapy:  Emotion Regulation  Participation Level:  Active   Description of Group:   The purpose of this group is to assist patients in learning to regulate negative emotions and experience positive emotions. Patients will be guided to discuss ways in which they have been vulnerable to their negative emotions. These vulnerabilities will be juxtaposed with experiences of positive emotions or situations, and patients will be challenged to use positive emotions to combat negative ones. Special emphasis will be placed on coping with negative emotions in conflict situations, and patients will process healthy conflict resolution skills.  Therapeutic Goals: 1. Patient will identify two positive emotions or experiences to reflect on in order to balance out negative emotions 2. Patient will label two or more emotions that they find the most difficult to experience 3. Patient will demonstrate positive conflict resolution skills through discussion and/or role plays  Summary of Patient Progress: Patient was present in group and attentive. Patient engaged in group discussion and reports how feelings of lies, hopelessness, anxiety and disrespect have made her feel vulnerable.  Patient engaged in discussion on how she has utilized laughter, reflection and music as coping mechanisms. Patient talked of feeling vulnerable and rejected due to personality differences and difference of opinions. CSW encouraged patient to utilize coping mechanisms.    Therapeutic Modalities:   Cognitive Behavioral Therapy Feelings Identification Dialectical Behavioral Therapy  Penni Homans, MSW, LCSW 10/17/2018 12:43 PM
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
## 2018-10-17 NOTE — Tx Team (Addendum)
Interdisciplinary Treatment and Diagnostic Plan Update  10/17/2018 Time of Session: 230PM Kelly Carr MRN: [id number]  Principal Diagnosis: <principal problem not specified>  Secondary Diagnoses: Active Problems:   Bipolar 1 disorder, manic, moderate (HCC)   Current Medications:  Current Facility-Administered Medications  Medication Dose Route Frequency Provider Last Rate Last Dose  . acetaminophen (TYLENOL) tablet 1,000 mg  1,000 mg Oral Q6H PRN [name], [name], MD      . acetaminophen (TYLENOL) tablet 650 mg  650 mg Oral Q6H PRN Mariel Craft, MD      . alum & mag hydroxide-simeth (MAALOX/MYLANTA) 200-200-20 MG/5ML suspension 30 mL  30 mL Oral Q4H PRN Mariel Craft, MD      . Melene Muller ON 10/18/2018] ARIPiprazole (ABILIFY) tablet 20 mg  20 mg Oral Daily [name], [name], MD      . Melene Muller ON 11/06/2018] ARIPiprazole ER (ABILIFY MAINTENA) injection 400 mg  400 mg Intramuscular Q28 days Mariel Craft, MD      . hydrOXYzine (ATARAX/VISTARIL) tablet 50 mg  50 mg Oral Q6H PRN Mariel Craft, MD      . ibuprofen (ADVIL) tablet 600 mg  600 mg Oral Q6H PRN Mariel Craft, MD   600 mg at 10/17/18 1257  . magnesium hydroxide (MILK OF MAGNESIA) suspension 30 mL  30 mL Oral Daily PRN Mariel Craft, MD      . nicotine (NICODERM CQ - dosed in mg/24 hours) patch 21 mg  21 mg Transdermal Daily Mariel Craft, MD   21 mg at 10/17/18 0858  . OLANZapine zydis (ZYPREXA) disintegrating tablet 10 mg  10 mg Oral Q8H PRN Mariel Craft, MD       And  . ziprasidone (GEODON) injection 20 mg  20 mg Intramuscular PRN Mariel Craft, MD      . Oxcarbazepine (TRILEPTAL) tablet 600 mg  600 mg Oral BID Mariel Craft, MD   600 mg at 10/17/18 0857  . sertraline (ZOLOFT) tablet 50 mg  50 mg Oral Daily Mariel Craft, MD   50 mg at 10/17/18 0857  . traZODone (DESYREL) tablet 50 mg  50 mg Oral QHS PRN,MR X 1 Mariel Craft, MD       PTA Medications: Medications Prior to Admission  Medication Sig  Dispense Refill Last Dose  . ARIPiprazole ER (ABILIFY MAINTENA) 400 MG SRER injection Inject 2 mLs (400 mg total) into the muscle every 28 (twenty-eight) days. (Patient not taking: Reported on 10/08/2018) 1 each 2 Not Taking at Unknown time  . hydrOXYzine (ATARAX/VISTARIL) 25 MG tablet Take 1 tablet (25 mg total) by mouth every 6 (six) hours as needed for itching or anxiety. 30 tablet 0 prn at prn  . nicotine (NICODERM CQ - DOSED IN MG/24 HOURS) 21 mg/24hr patch Place 1 patch (21 mg total) onto the skin daily. For smoking cessation 28 patch 0 unknown at unknown  . Oxcarbazepine (TRILEPTAL) 600 MG tablet Take 1 tablet (600 mg total) by mouth 2 (two) times daily. For mood 60 tablet 0 unknown at unknown  . sertraline (ZOLOFT) 50 MG tablet TAKE 1 TABLET BY MOUTH EVERY DAY 90 tablet 0 unknown at unknown  . traZODone (DESYREL) 50 MG tablet Take 1 tablet (50 mg total) by mouth at bedtime as needed for sleep. 30 tablet 0 prn at prn    Patient Stressors: Health problems Marital or family conflict Substance abuse  Patient Strengths: Ability for insight Capable of independent living Psychologist, counselling  means  Treatment Modalities: Medication Management, Group therapy, Case management,  1 to 1 session with clinician, Psychoeducation, Recreational therapy.   Physician Treatment Plan for Primary Diagnosis: <principal problem not specified> Long Term Goal(s):     Short Term Goals:    Medication Management: Evaluate patient's response, side effects, and tolerance of medication regimen.  Therapeutic Interventions: 1 to 1 sessions, Unit Group sessions and Medication administration.  Evaluation of Outcomes: Progressing  Physician Treatment Plan for Secondary Diagnosis: Active Problems:   Bipolar 1 disorder, manic, moderate (HCC)  Long Term Goal(s):     Short Term Goals:       Medication Management: Evaluate patient's response, side effects, and tolerance of medication  regimen.  Therapeutic Interventions: 1 to 1 sessions, Unit Group sessions and Medication administration.  Evaluation of Outcomes: Progressing   RN Treatment Plan for Primary Diagnosis: <principal problem not specified> Long Term Goal(s): Knowledge of disease and therapeutic regimen to maintain health will improve  Short Term Goals: Ability to demonstrate self-control, Ability to participate in decision making will improve and Ability to identify and develop effective coping behaviors will improve  Medication Management: RN will administer medications as ordered by provider, will assess and evaluate patient's response and provide education to patient for prescribed medication. RN will report any adverse and/or side effects to prescribing provider.  Therapeutic Interventions: 1 on 1 counseling sessions, Psychoeducation, Medication administration, Evaluate responses to treatment, Monitor vital signs and CBGs as ordered, Perform/monitor CIWA, COWS, AIMS and Fall Risk screenings as ordered, Perform wound care treatments as ordered.  Evaluation of Outcomes: Progressing   LCSW Treatment Plan for Primary Diagnosis: <principal problem not specified> Long Term Goal(s): Safe transition to appropriate next level of care at discharge, Engage patient in therapeutic group addressing interpersonal concerns.  Short Term Goals: Engage patient in aftercare planning with referrals and resources, Increase social support, Facilitate acceptance of mental health diagnosis and concerns and Increase skills for wellness and recovery  Therapeutic Interventions: Assess for all discharge needs, 1 to 1 time with Social worker, Explore available resources and support systems, Assess for adequacy in community support network, Educate family and significant other(s) on suicide prevention, Complete Psychosocial Assessment, Interpersonal group therapy.  Evaluation of Outcomes: Progressing   Progress in  Treatment: Attending groups: Yes. Participating in groups: Yes. Taking medication as prescribed: Yes. Toleration medication: Yes. Family/Significant other contact made: No, will contact:  pts daughter Patient understands diagnosis: Yes. Discussing patient identified problems/goals with staff: Yes. Medical problems stabilized or resolved: Yes. Denies suicidal/homicidal ideation: Yes. Issues/concerns per patient self-inventory: No. Other: N/A  New problem(s) identified: No, Describe:  none  New Short Term/Long Term Goal(s):  medication management for mood stabilization;  development of comprehensive mental wellness/sobriety plan.   Patient Goals:  "Have a plan and stick to it"  Discharge Plan or Barriers: SPE pamphlet, Mobile Crisis information, and AA/NA information provided to patient for additional community support and resources. Pt has an appointment with [facility] on 5/15 at 11AM.   Reason for Continuation of Hospitalization: Depression Medication stabilization  Estimated Length of Stay: 5-7 days  Recreational Therapy: Patient Stressors: Family, Relationship Patient Goal: Patient will successfully identify 2 ways of making healthy decisions post d/c within 5 recreation therapy group sessions  Attendees: Patient: Kelly Carr 10/17/2018 4:00 PM  Physician: Dr Toni Amendlapacs MD 10/17/2018 4:00 PM  Nursing: Floreen ComberShataraPowell RN 10/17/2018 4:00 PM  RN Care Manager: 10/17/2018 4:00 PM  Social Worker: Zollie Scalelivia Moton LCSW 10/17/2018 4:00 PM  Recreational Therapist: Danella DeisShay  Lizania Bouchard CTRS LRT 10/17/2018 4:00 PM  Other: Penni Homans LCSW 10/17/2018 4:00 PM  Other: Lowella Dandy LCSW 10/17/2018 4:00 PM  Other: 10/17/2018 4:00 PM    Scribe for Treatment Team: Charlann Lange Moton, LCSW 10/17/2018 4:00 PM

## 2018-10-17 NOTE — Progress Notes (Signed)
Recreation Therapy Notes  INPATIENT RECREATION THERAPY ASSESSMENT  Patient Details Name: Kelly Carr MRN: [id number] DOB: 01-31-1971 Today's Date: 10/17/2018       Information Obtained From: Patient  Able to Participate in Assessment/Interview: Yes  Patient Presentation: Responsive  Reason for Admission (Per Patient): Active Symptoms  Patient Stressors: Relationship, Family  Coping Skills:   Music, Prayer, Other (Comment), Hot Bath/Shower, Exercise(Walk)  Leisure Interests (2+):  Music - Singing, Sports - Dance, Social - Friends, Social - Family  Frequency of Recreation/Participation: Chief Executive Officer of Community Resources:     [employer]:     Current Use:    If no, Barriers?:    Expressed Interest in [employer] Information:    [place] of Residence:  [place]  Patient Main Form of Transportation: Set designer  Patient Strengths:  Helping others, patience  Patient Identified Areas of Improvement:  Put me first  Patient Goal for Hospitalization:  Have a plan and stick to it. Follow up with outside providers.  Current SI (including self-harm):  No  Current HI:  No  Current AVH: No  Staff Intervention Plan: Group Attendance, Collaborate with Interdisciplinary Treatment Team  Consent to Intern Participation: N/A  Elohim Brune 10/17/2018, 3:46 PM
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
## 2018-10-17 NOTE — [facility] Suicide Risk Assessment (Signed)
[facility]   Nursing information obtained from:  Patient Demographic factors:  Caucasian Current Mental Status:  NA Loss Factors:  Loss of significant relationship Historical Factors:  Family history of mental illness or substance abuse, Impulsivity Risk Reduction Factors:  Sense of responsibility to family, Positive social support  Total Time spent with patient: 1 hour Principal Problem: Bipolar 1 disorder, manic, moderate (HCC) Diagnosis:  Principal Problem:   Bipolar 1 disorder, manic, moderate (HCC) Active Problems:   HLD (hyperlipidemia)   Orbital floor (blow-out) closed fracture (HCC)  Subjective Data: Patient seen and chart reviewed.  Patient has bipolar disorder recently with mania as prominent symptom.  Denies any suicidal or homicidal thoughts at all.  No evidence of recent suicidality.  Currently appears to not be completely psychotic but still to be agitated and a little disorganized  Continued Clinical Symptoms:  Alcohol Use Disorder Identification Test Final Score (AUDIT): 9 The "Alcohol Use Disorders Identification Test", Guidelines for Use in Primary Care, Second Edition.  World Science writerHealth Organization [facility](WHO). Score between 0-7:  no or low risk or alcohol related problems. Score between 8-15:  moderate risk of alcohol related problems. Score between 16-19:  high risk of alcohol related problems. Score 20 or above:  warrants further diagnostic evaluation for alcohol dependence and treatment.   CLINICAL FACTORS:   Bipolar Disorder:   Mixed State   Musculoskeletal: Strength & Muscle Tone: within normal limits Gait & Station: normal Patient leans: N/A  Psychiatric Specialty Exam: Physical Exam  Nursing note and vitals reviewed. Constitutional: She appears well-developed and well-nourished.  HENT:  Head: Normocephalic and atraumatic.  Eyes: Pupils are equal, round, and reactive to light. Conjunctivae are normal.  Neck: Normal range of  motion.  Cardiovascular: Regular rhythm and normal heart sounds.  Respiratory: Effort normal. No respiratory distress.  GI: Soft.  Musculoskeletal: Normal range of motion.  Neurological: She is alert.  Skin: Skin is warm and dry.  Psychiatric: Her affect is labile. Her speech is tangential. She is agitated. She is not aggressive. Thought content is paranoid. Cognition and memory are impaired. She expresses impulsivity. She expresses no homicidal and no suicidal ideation.    Review of Systems  Constitutional: Negative.   HENT: Negative.   Eyes: Negative.   Respiratory: Negative.   Cardiovascular: Negative.   Gastrointestinal: Negative.   Musculoskeletal: Negative.   Skin: Negative.   Neurological: Negative.   Psychiatric/Behavioral: Negative for depression, hallucinations, substance abuse and suicidal ideas. The patient is nervous/anxious and has insomnia.     Blood pressure 122/79, pulse 86, temperature 98.8 F (37.1 C), temperature source Oral, resp. rate 18, height 5\' 5"  (1.651 m), weight 68.5 kg, last menstrual period 09/24/2018, SpO2 100 %.Body mass index is 25.13 kg/m.  General Appearance: Disheveled  Eye Contact:  Fair  Speech:  Normal Rate  Volume:  Increased  Mood:  Irritable  Affect:  Congruent  Thought Process:  Goal Directed  Orientation:  Full (Time, Place, and Person)  Thought Content:  Illogical, Rumination and Tangential  Suicidal Thoughts:  No  Homicidal Thoughts:  No  Memory:  Immediate;   Fair Recent;   Fair Remote;   Fair  Judgement:  Fair  Insight:  Fair  Psychomotor Activity:  Decreased  Concentration:  Concentration: Fair  Recall:  [employer]ervFair  Fund of Knowledge:  Fair  Language:  Fair  Akathisia:  No  Handed:  Right  AIMS (if indicated):     Assets:  Desire for Improvement Resilience  ADL's:  Intact  Cognition:  WNL  Sleep:  Number of Hours: 4      COGNITIVE FEATURES THAT CONTRIBUTE TO RISK:  Loss of executive function and Polarized thinking     SUICIDE RISK:   Minimal: No identifiable suicidal ideation.  Patients presenting with no risk factors but with morbid ruminations; may be classified as minimal risk based on the severity of the depressive symptoms  PLAN OF CARE: Patient continued on mood stabilizing medicine and antipsychotic.  Pain medicine provided.  Try to get a good night sleep for get her calm down and see if we can get her involved in psychoeducational groups and then make decisions about appropriate discharge planning  I certify that inpatient services furnished can reasonably be expected to improve the patient's condition.   Mordecai Rasmussen, MD 10/17/2018, 5:17 PM

## 2018-10-17 NOTE — Plan of Care (Signed)
Pt states she slept "well". Pt rates depression 2/10 and anxiety 7/10. Pt denies SI, HI and AVH. Pt has a goal "to take it easy". Pt was educated on care plan and verbalizes understanding. Torrie Mayers RN Problem: Education: Goal: Utilization of techniques to improve thought processes will improve Outcome: Progressing Goal: Knowledge of the prescribed therapeutic regimen will improve Outcome: Progressing   Problem: Activity: Goal: Interest or engagement in leisure activities will improve Outcome: Progressing Goal: Imbalance in normal sleep/wake cycle will improve Outcome: Progressing   Problem: Coping: Goal: Coping ability will improve Outcome: Progressing Goal: Will verbalize feelings Outcome: Progressing   Problem: Health Behavior/Discharge Planning: Goal: Ability to make decisions will improve Outcome: Progressing Goal: Compliance with therapeutic regimen will improve Outcome: Progressing   Problem: Safety: Goal: Ability to disclose and discuss suicidal ideas will improve Outcome: Progressing Goal: Ability to identify and utilize support systems that promote safety will improve Outcome: Progressing   Problem: Self-Concept: Goal: Will verbalize positive feelings about self Outcome: Progressing Goal: Level of anxiety will decrease Outcome: Not Progressing   Problem: Education: Goal: Ability to make informed decisions regarding treatment will improve Outcome: Progressing   Problem: Coping: Goal: Coping ability will improve Outcome: Progressing   Problem: Health Behavior/Discharge Planning: Goal: Identification of resources available to assist in meeting health care needs will improve Outcome: Progressing   Problem: Medication: Goal: Compliance with prescribed medication regimen will improve Outcome: Progressing   Problem: Self-Concept: Goal: Ability to disclose and discuss suicidal ideas will improve Outcome: Progressing Goal: Will verbalize positive feelings  about self Outcome: Progressing   Problem: Education: Goal: Knowledge of disease or condition will improve Outcome: Progressing Goal: Understanding of discharge needs will improve Outcome: Progressing   Problem: Health Behavior/Discharge Planning: Goal: Ability to identify changes in lifestyle to reduce recurrence of condition will improve Outcome: Progressing Goal: Identification of resources available to assist in meeting health care needs will improve Outcome: Progressing   Problem: Physical Regulation: Goal: Complications related to the disease process, condition or treatment will be avoided or minimized Outcome: Progressing

## 2018-10-18 NOTE — Progress Notes (Signed)
Patient's call light came on and I stepped in the room and heard the shower on. I asked the patient if she was okay or needed something and she told me to come in the bathroom. The pt was in the shower naked and asked me to come and "hold a cold compress on her eyes". I asked her if she was in pain and she said "no". I asked her what was wrong with her eyes and she said "I got soap in them and it stings". Her eyes were opened and I assessed them and found no redness or issues of concern. She said "If you don't come in here I'm just going to fall and break my leg". I told her "maybe you should go ahead and get out of the shower" and I handed her a towel. I stepped out of the bathroom because she was getting loud and angry as she glared at me. I asked her if she needed a prn medicine for her agitation and anxiety and she said "no". Torrie Mayers RN

## 2018-10-18 NOTE — Progress Notes (Signed)
[facility]  10/18/2018 4:33 PM Kelly Carr  MRN:  [id number] Subjective: Patient seen and chart reviewed.  Patient with bipolar disorder.  She has been compliant with medicine.  She says today she is feeling better.  During the interview she is hyperverbal and somewhat disorganized but not grossly psychotic.  Insight is partial at best but she is not threatening violent or suicidal.  Continues to have facial pain but responded well to current medications. Principal Problem: Bipolar 1 disorder, manic, moderate (HCC) Diagnosis: Principal Problem:   Bipolar 1 disorder, manic, moderate (HCC) Active Problems:   HLD (hyperlipidemia)   Orbital floor (blow-out) closed fracture (HCC)  Total Time spent with patient: 30 minutes  Past Psychiatric History: Patient has a history of bipolar disorder multiple recent hospitalizations  Past Medical History:  Past Medical History:  Diagnosis Date  . Abnormal Pap smear    Unknown results>colpo>normal  . Anxiety   . Arthritis   . Asthma   . Bipolar 1 disorder (HCC)   . Depression     Past Surgical History:  Procedure Laterality Date  . NO PAST SURGERIES     Family History:  Family History  Problem Relation Age of Onset  . Diabetes Mother   . Hypertension Mother   . Heart disease Mother [id number]  . Schizophrenia Mother   . Diabetes Maternal Grandmother   . Heart disease Maternal Grandmother   . Diabetes Maternal Grandfather   . Heart disease Maternal Grandfather   . Bipolar disorder Cousin   . Bipolar disorder Nephew   . Depression Daughter    Family Psychiatric  History: See previous Social History:  Social History   Substance and Sexual Activity  Alcohol Use Yes   Comment: "a lot"     Social History   Substance and Sexual Activity  Drug Use Not Currently    Social History   Socioeconomic History  . Marital status: Single    Spouse name: Not on file  . Number of children: Not on file  . Years of education: Not on  file  . Highest education level: Not on file  Occupational History  . Not on file  Social Needs  . Financial resource strain: Not on file  . Food insecurity:    Worry: Not on file    Inability: Not on file  . Transportation needs:    Medical: Not on file    Non-medical: Not on file  Tobacco Use  . Smoking status: Current Every Day Smoker    Packs/day: 1.50    Types: Cigarettes, E-cigarettes  . Smokeless tobacco: Never Used  Substance and Sexual Activity  . Alcohol use: Yes    Comment: "a lot"  . Drug use: Not Currently  . Sexual activity: Yes    Partners: Male    Birth control/protection: None  Lifestyle  . Physical activity:    Days per week: Not on file    Minutes per session: Not on file  . Stress: Not on file  Relationships  . Social connections:    Talks on phone: Not on file    Gets together: Not on file    Attends religious service: Not on file    Active member of club or organization: Not on file    Attends meetings of clubs or organizations: Not on file    Relationship status: Not on file  Other Topics Concern  . Not on file  Social History Narrative  . Not on file  Additional Social History:                         Sleep: Fair  Appetite:  Fair  Current Medications: Current Facility-Administered Medications  Medication Dose Route Frequency Provider Last Rate Last Dose  . acetaminophen (TYLENOL) tablet 1,000 mg  1,000 mg Oral Q6H PRN [name], [name], MD      . acetaminophen (TYLENOL) tablet 650 mg  650 mg Oral Q6H PRN Mariel Craft, MD      . alum & mag hydroxide-simeth (MAALOX/MYLANTA) 200-200-20 MG/5ML suspension 30 mL  30 mL Oral Q4H PRN Mariel Craft, MD      . ARIPiprazole (ABILIFY) tablet 20 mg  20 mg Oral Daily [name], [name], MD   20 mg at 10/18/18 1610  . [START ON 11/06/2018] ARIPiprazole ER (ABILIFY MAINTENA) injection 400 mg  400 mg Intramuscular Q28 days Mariel Craft, MD      . hydrOXYzine (ATARAX/VISTARIL) tablet 50 mg   50 mg Oral Q6H PRN Mariel Craft, MD   50 mg at 10/17/18 1729  . ibuprofen (ADVIL) tablet 800 mg  800 mg Oral Q8H PRN [name], [name], MD      . magnesium hydroxide (MILK OF MAGNESIA) suspension 30 mL  30 mL Oral Daily PRN Mariel Craft, MD      . nicotine (NICODERM CQ - dosed in mg/24 hours) patch 21 mg  21 mg Transdermal Daily Mariel Craft, MD   21 mg at 10/18/18 9604  . OLANZapine zydis (ZYPREXA) disintegrating tablet 10 mg  10 mg Oral Q8H PRN Mariel Craft, MD       And  . ziprasidone (GEODON) injection 20 mg  20 mg Intramuscular PRN Mariel Craft, MD      . Oxcarbazepine (TRILEPTAL) tablet 600 mg  600 mg Oral BID Mariel Craft, MD   600 mg at 10/18/18 1618  . oxyCODONE (Oxy IR/ROXICODONE) immediate release tablet 5 mg  5 mg Oral Q6H PRN [name], [name], MD   5 mg at 10/18/18 5409  . sertraline (ZOLOFT) tablet 50 mg  50 mg Oral Daily Mariel Craft, MD   50 mg at 10/18/18 8119  . traZODone (DESYREL) tablet 50 mg  50 mg Oral QHS PRN,MR X 1 Mariel Craft, MD   50 mg at 10/17/18 2239    Lab Results:  Results for orders placed or performed during the hospital encounter of 10/15/18 (from the past 48 hour(s))  SARS Coronavirus 2 [facility] order, Performed in [facility] hospital lab)     Status: None   Collection Time: 10/16/18  6:29 PM  Result Value Ref Range   SARS Coronavirus 2 NEGATIVE NEGATIVE    Comment: (NOTE) If result is NEGATIVE SARS-CoV-2 target nucleic acids are NOT DETECTED. The SARS-CoV-2 RNA is generally detectable in upper and lower  respiratory specimens during the acute phase of infection. The lowest  concentration of SARS-CoV-2 viral copies this assay can detect is 250  copies / mL. A negative result does not preclude SARS-CoV-2 infection  and should not be used as the sole basis for treatment or other  patient management decisions.  A negative result may occur with  improper specimen collection / handling, submission of specimen other  than  nasopharyngeal swab, presence of viral mutation(s) within the  areas targeted by this assay, and inadequate number of viral copies  (<250 copies / mL). A negative result must be  combined with clinical  observations, patient history, and epidemiological information. If result is POSITIVE SARS-CoV-2 target nucleic acids are DETECTED. The SARS-CoV-2 RNA is generally detectable in upper and lower  respiratory specimens dur ing the acute phase of infection.  Positive  results are indicative of active infection with SARS-CoV-2.  Clinical  correlation with patient history and other diagnostic information is  necessary to determine patient infection status.  Positive results do  not rule out bacterial infection or co-infection with other viruses. If result is PRESUMPTIVE POSTIVE SARS-CoV-2 nucleic acids MAY BE PRESENT.   A presumptive positive result was obtained on the submitted specimen  and confirmed on repeat testing.  While 2019 novel coronavirus  (SARS-CoV-2) nucleic acids may be present in the submitted sample  additional confirmatory testing may be necessary for epidemiological  and / or clinical management purposes  to differentiate between  SARS-CoV-2 and other Sarbecovirus currently known to infect humans.  If clinically indicated additional testing with an alternate test  methodology [phone]) is advised. The SARS-CoV-2 RNA is generally  detectable in upper and lower respiratory sp ecimens during the acute  phase of infection. The expected result is Negative. Fact Sheet for Patients:  BoilerBrush.com.cy Fact Sheet for Healthcare Providers: https://pope.com/ This test is not yet approved or cleared by the [place] FDA and has been authorized for detection and/or diagnosis of SARS-CoV-2 by FDA under an Emergency Use Authorization (EUA).  This EUA will remain in effect (meaning this test can be used) for the duration of  the COVID-19 declaration under Section 564(b)(1) of the Act, 21 U.S.C. section 360bbb-3(b)(1), unless the authorization is terminated or revoked sooner. Performed at [facility], [address] [place], [place] [address]     Blood Alcohol level:  Lab Results  Component Value Date   [facility] <10 10/15/2018   ETH <10 10/14/2018    Metabolic Disorder Labs: Lab Results  Component Value Date   HGBA1C 5.0 10/09/2018   MPG 96.8 10/09/2018   MPG 96.8 03/21/2018   Lab Results  Component Value Date   PROLACTIN 39.5 (H) 04/12/2018   PROLACTIN 2.0 12/05/2011   Lab Results  Component Value Date   CHOL 215 (H) 10/09/2018   TRIG 83 10/09/2018   HDL 85 10/09/2018   CHOLHDL 2.5 10/09/2018   VLDL 17 10/09/2018   LDLCALC 113 (H) 10/09/2018   LDLCALC 118 (H) 03/21/2018    Physical Findings: AIMS: Facial and Oral Movements Muscles of Facial Expression: None, normal Lips and Perioral Area: None, normal Jaw: None, normal Tongue: None, normal,Extremity Movements Upper (arms, wrists, hands, fingers): None, normal Lower (legs, knees, ankles, toes): None, normal, Trunk Movements Neck, shoulders, hips: None, normal, Overall Severity Severity of abnormal movements (highest score from questions above): None, normal Incapacitation due to abnormal movements: None, normal Patient's awareness of abnormal movements (rate only patient's report): No Awareness, Dental Status Current problems with teeth and/or dentures?: No Does patient usually wear dentures?: No  CIWA:  CIWA-Ar Total: 3 COWS:  COWS Total Score: 3  Musculoskeletal: Strength & Muscle Tone: within normal limits Gait & Station: normal Patient leans: N/A  Psychiatric Specialty Exam: Physical Exam  Nursing note and vitals reviewed. Constitutional: She appears well-developed and well-nourished.  HENT:  Head: Normocephalic and atraumatic.  Eyes: Pupils are equal, round, and reactive to light. Conjunctivae are normal.   Neck: Normal range of motion.  Cardiovascular: Regular rhythm and normal heart sounds.  Respiratory: Effort normal. No respiratory distress.  GI: Soft.  Musculoskeletal:  Normal range of motion.  Neurological: She is alert.  Skin: Skin is warm and dry.  Psychiatric: Her mood appears anxious. Her speech is rapid and/or pressured. She is agitated. She is not aggressive. Thought content is not paranoid. Cognition and memory are impaired. She expresses impulsivity. She expresses no homicidal and no suicidal ideation.    Review of Systems  Constitutional: Negative.   HENT: Negative.   Eyes: Negative.   Respiratory: Negative.   Cardiovascular: Negative.   Gastrointestinal: Negative.   Musculoskeletal: Negative.   Skin: Negative.   Neurological: Negative.   Psychiatric/Behavioral: Negative for depression, hallucinations, memory loss, substance abuse and suicidal ideas. The patient is nervous/anxious. The patient does not have insomnia.     Blood pressure 118/79, pulse 93, temperature 98.5 F (36.9 C), temperature source Oral, resp. rate 17, height 5\' 5"  (1.651 m), weight 68.5 kg, last menstrual period 09/24/2018, SpO2 100 %.Body mass index is 25.13 kg/m.  General Appearance: Casual  Eye Contact:  Good  Speech:  Clear and Coherent  Volume:  Increased  Mood:  Anxious  Affect:  Congruent  Thought Process:  Coherent  Orientation:  Full (Time, Place, and Person)  Thought Content:  Rumination and Tangential  Suicidal Thoughts:  No  Homicidal Thoughts:  No  Memory:  Immediate;   Fair Recent;   Fair Remote;   Fair  Judgement:  Impaired  Insight:  Shallow  Psychomotor Activity:  Increased  Concentration:  Concentration: Fair  Recall:  [employer] of Knowledge:  Fair  Language:  Fair  Akathisia:  No  Handed:  Right  AIMS (if indicated):     Assets:  Desire for Improvement Housing Resilience  ADL's:  Intact  Cognition:  WNL  Sleep:  Number of Hours: 5.5     Treatment Plan  Summary: Daily contact with patient to assess and evaluate symptoms and progress in treatment, Medication management and Plan Taking her medicine.  Not aggressive or agitated here.  Not grossly psychotic.  Plan is to continue oral medicine a little while longer and discharge her on the oral Abilify as well.  Probably stay here another 3 days for stabilization prior to discharge given the multiple recent hospitalizations  Mordecai Rasmussen, MD 10/18/2018, 4:33 PM

## 2018-10-18 NOTE — Progress Notes (Signed)
Patient alert and oriented x 4,, affect is flat but she brightens upon approach, patient expressed pain earlier during the shift was noted to have ecchymosis around her face, she was medicated for pain per standing order and she expressed relief. Patient's thoughts are organized and coherent, no paranoia or bizarre behavior during the shift, interacting appropriately with peers and staff and complaint with prescribed medication regimen. 15 minutes safety checks maintained will continue to monitor.

## 2018-10-18 NOTE — Progress Notes (Signed)
Recreation Therapy Notes   Date: 10/18/2018  Time: 9:30 am  Location: Craft room  Behavioral response: Appropriate  Intervention Topic: Emotions  Discussion/Intervention:  Group content on today was focused on emotions. The group identified what emotions are and why it is important to have emotions. Patients expressed some positive and negative emotions. Individuals gave some past experiences on how they normally dealt with emotions in the past. The group described some positive ways to deal with emotions in the future. Patients participated in the intervention "Name the Megan Salon" where individuals were given a chance to experience different emotions.    Clinical Observations/Feedback:  Patient came to group and defined emotions as body and soul. She identified joy as a common emotion she feels. Participant described that she tries to find joy in all situations. Patient stated life has made her sadness turn into anger. She explained a lot of her sadness comes from being lonely and being used by people she cares about. Individual was social with staff while engaging in the intervention. Shanda Cadotte LRT/CTRS         Ileene Allie 10/18/2018 12:22 PM

## 2018-10-18 NOTE — [facility] Suicide Risk Assessment (Signed)
[facility] INPATIENT:  Family/Significant Other Suicide Prevention Education  Suicide Prevention Education:  Education Completed; Lillia Dallas, daughter [phone] has been identified by the patient as the family member/significant other with whom the patient will be residing, and identified as the person(s) who will aid the patient in the event of a mental health crisis (suicidal ideations/suicide attempt).  With written consent from the patient, the family member/significant other has been provided the following suicide prevention education, prior to the and/or following the discharge of the patient.  The suicide prevention education provided includes the following:  Suicide risk factors  Suicide prevention and interventions  National Suicide Hotline telephone number  [facility] assessment telephone number  [facility] Emergency Assistance 911  [facility] and/or Residential Mobile Crisis Unit telephone number  Request made of family/significant other to:  Remove weapons (e.g., guns, rifles, knives), all items previously/currently identified as safety concern.    Remove drugs/medications (over-the-counter, prescriptions, illicit drugs), all items previously/currently identified as a safety concern.  The family member/significant other verbalizes understanding of the suicide prevention education information provided.  The family member/significant other agrees to remove the items of safety concern listed above.  Tiffany reported that her mom ended up in the hospital due to her last relationship and how it all ended and pt not taking all her medications like she was supposed to. Tiffany denies any concerns with pt returning home at discharge and denied knowledge of any guns/weapons in the home. Tiffany reported she does have SI/HI concerns due to pt expressing to her previously about wanting to harm herself and made statements about hurting the neighbors. Tiffany denied  pt having a specific plan of how to harm herself or hurt the neighbors. Tiffany expressed understanding of suicide education and emergency numbers to call.  Charlann Lange Hollye Pritt MSW LCSW 10/18/2018, 9:26 AM

## 2018-10-18 NOTE — Plan of Care (Signed)
Pt states sleep "wonderful". Pt denies SI. HI, AVH and depression. Pt rates anxiety 5/10. Pt has a goal "to stay focused". Pt was educated on care plan and verbalizes understanding, Pt denies needing and prn medication for anxiety. Torrie Mayers RN Problem: Education: Goal: Utilization of techniques to improve thought processes will improve Outcome: Not Progressing Goal: Knowledge of the prescribed therapeutic regimen will improve Outcome: Progressing   Problem: Activity: Goal: Interest or engagement in leisure activities will improve Outcome: Progressing Goal: Imbalance in normal sleep/wake cycle will improve Outcome: Progressing   Problem: Coping: Goal: Coping ability will improve Outcome: Not Progressing Goal: Will verbalize feelings Outcome: Progressing   Problem: Health Behavior/Discharge Planning: Goal: Ability to make decisions will improve Outcome: Not Progressing Goal: Compliance with therapeutic regimen will improve Outcome: Progressing   Problem: Safety: Goal: Ability to disclose and discuss suicidal ideas will improve Outcome: Progressing Goal: Ability to identify and utilize support systems that promote safety will improve Outcome: Progressing   Problem: Self-Concept: Goal: Will verbalize positive feelings about self Outcome: Not Progressing Goal: Level of anxiety will decrease Outcome: Not Progressing   Problem: Education: Goal: Ability to make informed decisions regarding treatment will improve Outcome: Not Progressing   Problem: Coping: Goal: Coping ability will improve Outcome: Not Progressing   Problem: Health Behavior/Discharge Planning: Goal: Identification of resources available to assist in meeting health care needs will improve Outcome: Progressing   Problem: Medication: Goal: Compliance with prescribed medication regimen will improve Outcome: Progressing   Problem: Self-Concept: Goal: Ability to disclose and discuss suicidal ideas will  improve Outcome: Progressing Goal: Will verbalize positive feelings about self Outcome: Not Progressing   Problem: Education: Goal: Knowledge of disease or condition will improve Outcome: Not Progressing Goal: Understanding of discharge needs will improve Outcome: Progressing   Problem: Health Behavior/Discharge Planning: Goal: Ability to identify changes in lifestyle to reduce recurrence of condition will improve Outcome: Not Progressing Goal: Identification of resources available to assist in meeting health care needs will improve Outcome: Progressing   Problem: Physical Regulation: Goal: Complications related to the disease process, condition or treatment will be avoided or minimized Outcome: Not Progressing   Problem: Safety: Goal: Ability to remain free from injury will improve Outcome: Not Progressing

## 2018-10-18 NOTE — Plan of Care (Signed)
  Problem: Coping: Goal: Coping ability will improve Outcome: Progressing  Patient appears to be coping effectively and using coping skills.

## 2018-10-18 NOTE — [facility] Group Notes (Signed)
Balance In Life 10/18/2018 1PM  Type of Therapy/Topic:  Group Therapy:  Balance in Life  Participation Level:  Active  Description of Group:   This group will address the concept of balance and how it feels and looks when one is unbalanced. Patients will be encouraged to process areas in their lives that are out of balance and identify reasons for remaining unbalanced. Facilitators will guide patients in utilizing problem-solving interventions to address and correct the stressor making their life unbalanced. Understanding and applying boundaries will be explored and addressed for obtaining and maintaining a balanced life. Patients will be encouraged to explore ways to assertively make their unbalanced needs known to significant others in their lives, using other group members and facilitator for support and feedback.  Therapeutic Goals: 1. Patient will identify two or more emotions or situations they have that consume much of in their lives. 2. Patient will identify signs/triggers that life has become out of balance:  3. Patient will identify two ways to set boundaries in order to achieve balance in their lives:  4. Patient will demonstrate ability to communicate their needs through discussion and/or role plays  Summary of Patient Progress: Pt had to be encouraged to allow others to speak as she divulged a great deal about the challenges that are causing her life to be imbalanced. Pt states when she returns home she is committed to learning how to prioritize task to avoid being imbalanced.    Therapeutic Modalities:   Cognitive Behavioral Therapy Solution-Focused Therapy Assertiveness Training  Weber Monnier Philip Aspen, LCSW

## 2018-10-19 ENCOUNTER — Ambulatory Visit: Payer: Self-pay | Admitting: Physician Assistant

## 2018-10-19 MED ORDER — HYDROXYZINE HCL 50 MG PO TABS: 50.0000 mg | ORAL_TABLET | Freq: Four times a day (QID) | ORAL | 0 refills | Status: DC | PRN

## 2018-10-19 MED ORDER — ARIPIPRAZOLE 20 MG PO TABS: 20.0000 mg | ORAL_TABLET | Freq: Every day | ORAL | 0 refills | Status: DC

## 2018-10-19 MED ORDER — OXCARBAZEPINE 600 MG PO TABS: 600.0000 mg | ORAL_TABLET | Freq: Two times a day (BID) | ORAL | 0 refills | Status: DC

## 2018-10-19 MED ORDER — TRAZODONE HCL 50 MG PO TABS: 50.0000 mg | ORAL_TABLET | Freq: Every evening | ORAL | 0 refills | Status: DC | PRN
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
## 2018-10-19 NOTE — Progress Notes (Signed)
Recreation Therapy Notes  Date: 10/19/2018  Time: 9:30 am  Location: Craft room  Behavioral response: Appropriate   Intervention Topic: Team work  Discussion/Intervention:  Group content on today was focused on teamwork. The group identified what teamwork is. Individuals described who is a part of their team. Patients expressed why they thought teamwork is important. The group stated reasons why they thought it was easier to work with a Comptroller team. Individuals discussed some positives and negatives of working with a team. Patients gave examples of past experiences they had while working with a team. The group participated in the intervention "What is That", where patients were given a chance to point out qualities they look for in a team mate and were able to work in teams with each other.   Clinical Observations/Feedback:  Patient came to group and stated it is important to work in a team because there are more minds and opinions. She identified time management, organization and loyalty as qualities a team should have. Individual was social with peers and staff while participating in the intervention. Kayren Holck LRT/CTRS              Bob Eastwood 10/19/2018 11:06 AM
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
## 2018-10-19 NOTE — B[facility] Group Notes (Signed)
LCSW Group Therapy Note  10/19/2018 12:22 PM  Type of Therapy and Topic:  Group Therapy:  Feelings around Relapse and Recovery  Participation Level:  Active   Description of Group:    Patients in this group will discuss emotions they experience before and after a relapse. They will process how experiencing these feelings, or avoidance of experiencing them, relates to having a relapse. Facilitator will guide patients to explore emotions they have related to recovery. Patients will be encouraged to process which emotions are more powerful. They will be guided to discuss the emotional reaction significant others in their lives may have to their relapse or recovery. Patients will be assisted in exploring ways to respond to the emotions of others without this contributing to a relapse.  Therapeutic Goals: 1. Patient will identify two or more emotions that lead to a relapse for them 2. Patient will identify two emotions that result when they relapse 3. Patient will identify two emotions related to recovery 4. Patient will demonstrate ability to communicate their needs through discussion and/or role plays   Summary of Patient Progress: Pt was appropriate and respectful in group. Pt was able to identify a relapse as "going back to the same patterns". Pt discussed it being okay to be low sometimes, but know that there will be a high again. Pt discussed risk factors as people and places and resiliency factors as religion, positive people, good supports, and therapy. Pt reported plans to cut off some of her family members to assist herself with staying in recovery and not returning to the hospital.    Therapeutic Modalities:   Cognitive Behavioral Therapy Solution-Focused Therapy Assertiveness Training Relapse Prevention Therapy   Iris Pert, MSW, LCSW Clinical Social Work 10/19/2018 12:22 PM

## 2018-10-19 NOTE — Plan of Care (Signed)
D- Patient alert and oriented. Patient presents in a pleasant mood on assessment stating that she slept "on and off" last night because of her pain, in which she rated her pain level a "45/10", and she did request pain medication from this Clinical research associate. Patient rated her depression a "2/10" and denied any anxiety, however, she stated "I'm hopeful and it's hard to be moving, but it has to be done". Patient denies SI, HI, AVH, at this time. Patient's goal for today is "taking it easy", in which she will "pray, read, rest, and chill" in order to reach her goal.  A- Scheduled medications administered to patient, per MD orders. Support and encouragement provided.  Routine safety checks conducted every 15 minutes.  Patient informed to notify staff with problems or concerns.  R- No adverse drug reactions noted. Patient contracts for safety at this time. Patient compliant with medications and treatment plan. Patient receptive, calm, and cooperative. Patient interacts well with others on the unit.  Patient remains safe at this time.  Problem: Education: Goal: Utilization of techniques to improve thought processes will improve Outcome: Progressing Goal: Knowledge of the prescribed therapeutic regimen will improve Outcome: Progressing   Problem: Activity: Goal: Interest or engagement in leisure activities will improve Outcome: Progressing Goal: Imbalance in normal sleep/wake cycle will improve Outcome: Progressing   Problem: Coping: Goal: Coping ability will improve Outcome: Progressing Goal: Will verbalize feelings Outcome: Progressing   Problem: Health Behavior/Discharge Planning: Goal: Ability to make decisions will improve Outcome: Progressing Goal: Compliance with therapeutic regimen will improve Outcome: Progressing   Problem: Safety: Goal: Ability to disclose and discuss suicidal ideas will improve Outcome: Progressing Goal: Ability to identify and utilize support systems that promote safety  will improve Outcome: Progressing   Problem: Self-Concept: Goal: Will verbalize positive feelings about self Outcome: Progressing Goal: Level of anxiety will decrease Outcome: Progressing   Problem: Education: Goal: Ability to make informed decisions regarding treatment will improve Outcome: Progressing   Problem: Coping: Goal: Coping ability will improve Outcome: Progressing   Problem: Medication: Goal: Compliance with prescribed medication regimen will improve Outcome: Progressing   Problem: Self-Concept: Goal: Ability to disclose and discuss suicidal ideas will improve Outcome: Progressing Goal: Will verbalize positive feelings about self Outcome: Progressing   Problem: Education: Goal: Knowledge of disease or condition will improve Outcome: Progressing Goal: Understanding of discharge needs will improve Outcome: Progressing   Problem: Health Behavior/Discharge Planning: Goal: Ability to identify changes in lifestyle to reduce recurrence of condition will improve Outcome: Progressing Goal: Identification of resources available to assist in meeting health care needs will improve Outcome: Progressing   Problem: Physical Regulation: Goal: Complications related to the disease process, condition or treatment will be avoided or minimized Outcome: Progressing   Problem: Safety: Goal: Ability to remain free from injury will improve Outcome: Progressing

## 2018-10-19 NOTE — Plan of Care (Signed)
  Problem: Education: Goal: Utilization of techniques to improve thought processes will improve Outcome: Progressing  Patient has more insight to improve her thought process.

## 2018-10-20 DIAGNOSIS — F3112 Bipolar disorder, current episode manic without psychotic features, moderate: Principal | ICD-10-CM

## 2018-10-20 MED ORDER — TRAZODONE HCL 100 MG PO TABS
100.0000 mg | ORAL_TABLET | Freq: Every day | ORAL | Status: DC
  Administered 2018-10-20 – 2018-10-21 (×2): 100 mg via ORAL
  Filled 2018-10-20 (×2): qty 1

## 2018-10-20 MED ORDER — PANTOPRAZOLE SODIUM 40 MG PO TBEC
40.0000 mg | DELAYED_RELEASE_TABLET | Freq: Every day | ORAL | Status: DC
  Administered 2018-10-20 – 2018-10-22 (×3): 40 mg via ORAL
  Filled 2018-10-20 (×3): qty 1

## 2018-10-20 NOTE — [facility] Group Notes (Signed)
LCSW Group Therapy Note  10/20/2018 1:15pm  Type of Therapy and Topic:  Group Therapy:  Cognitive Distortions  Participation Level:  Active   Description of Group:    Patients in this group will be introduced to the topic of cognitive distortions.  Patients will identify and describe cognitive distortions, describe the feelings these distortions create for them.  Patients will identify one or more situations in their personal life where they have cognitively distorted thinking and will verbalize challenging this cognitive distortion through positive thinking skills.  Patients will practice the skill of using positive affirmations to challenge cognitive distortions using affirmation cards.    Therapeutic Goals:  1. Patient will identify two or more cognitive distortions they have used 2. Patient will identify one or more emotions that stem from use of a cognitive distortion 3. Patient will demonstrate use of a positive affirmation to counter a cognitive distortion through discussion and/or role play. 4. Patient will describe one way cognitive distortions can be detrimental to wellness   Summary of Patient Progress: The patient reported that  she feels "much better." Patients were introduced to the topic of cognitive distortions. The patient was able to identify and describe cognitive distortions, described the feelings these distortions create for  her.  The patient shared she tends to use "magnification" unhealthy style often. Patient identified a situation in her personal life where she has cognitively distorted thinking and was able to verbalize and challenged this cognitive distortion through positive thinking skills. Patient was able to provide support and validation to other group members.     Therapeutic Modalities:   Cognitive Behavioral Therapy Motivational Interviewing   Ison Wichmann  [name], LCSW 10/20/2018 10:18 AM
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
## 2018-10-20 NOTE — Progress Notes (Addendum)
D - Patient was in the day room upon arrival to the unit. Patient was pleasant during assessment and medication administration. Patient denies SI/HI/AVH. Patient endorses anxiety and depression stating, "I am anxious about leaving here and going back to my home." Patient said she feels like she will be ready to get out of here and back up her home and find a new place to leave.   A - Patient compliant with medication administration per MD orders and procedures on the unit. Patient given education. Patient given support and encouragement to be active in her treatment plan. Patient informed to let staff know if there are any issues or problems on the unit.   R - Patient being monitored Q 15 minutes for safety per unit protocol. Patient remains safe on the unit.

## 2018-10-20 NOTE — Plan of Care (Signed)
Patient  aware of  Education , unit programing , able to verbalize understanding of information received. Mental and emotional status improved. Engaging in activities on the unit . Voice no concern around wake or sleep cycle  Aware of recourses   with placement .  Voice of no safety concerns . Continue to work on coping  and Tourist information centre manager .  Problem: Education: Goal: Utilization of techniques to improve thought processes will improve Outcome: Progressing Goal: Knowledge of the prescribed therapeutic regimen will improve Outcome: Progressing   Problem: Activity: Goal: Interest or engagement in leisure activities will improve Outcome: Progressing Goal: Imbalance in normal sleep/wake cycle will improve Outcome: Progressing   Problem: Coping: Goal: Coping ability will improve Outcome: Progressing Goal: Will verbalize feelings Outcome: Progressing   Problem: Health Behavior/Discharge Planning: Goal: Ability to make decisions will improve Outcome: Progressing Goal: Compliance with therapeutic regimen will improve Outcome: Progressing   Problem: Safety: Goal: Ability to disclose and discuss suicidal ideas will improve Outcome: Progressing Goal: Ability to identify and utilize support systems that promote safety will improve Outcome: Progressing   Problem: Self-Concept: Goal: Will verbalize positive feelings about self Outcome: Progressing Goal: Level of anxiety will decrease Outcome: Progressing   Problem: Education: Goal: Ability to make informed decisions regarding treatment will improve Outcome: Progressing   Problem: Coping: Goal: Coping ability will improve Outcome: Progressing   Problem: Health Behavior/Discharge Planning: Goal: Identification of resources available to assist in meeting health care needs will improve Outcome: Progressing   Problem: Medication: Goal: Compliance with prescribed medication regimen will improve Outcome:  Progressing   Problem: Self-Concept: Goal: Ability to disclose and discuss suicidal ideas will improve Outcome: Progressing Goal: Will verbalize positive feelings about self Outcome: Progressing   Problem: Education: Goal: Knowledge of disease or condition will improve Outcome: Progressing Goal: Understanding of discharge needs will improve Outcome: Progressing   Problem: Health Behavior/Discharge Planning: Goal: Ability to identify changes in lifestyle to reduce recurrence of condition will improve Outcome: Progressing Goal: Identification of resources available to assist in meeting health care needs will improve Outcome: Progressing   Problem: Physical Regulation: Goal: Complications related to the disease process, condition or treatment will be avoided or minimized Outcome: Progressing   Problem: Safety: Goal: Ability to remain free from injury will improve Outcome: Progressing

## 2018-10-20 NOTE — Plan of Care (Signed)
Patient compliant with medication administration per MD orders  Problem: Education: Goal: Knowledge of the prescribed therapeutic regimen will improve Outcome: Progressing   

## 2018-10-21 MED ORDER — OXCARBAZEPINE 300 MG PO TABS
300.0000 mg | ORAL_TABLET | Freq: Two times a day (BID) | ORAL | Status: DC
  Administered 2018-10-21 – 2018-10-22 (×2): 300 mg via ORAL
  Filled 2018-10-21 (×2): qty 1

## 2018-10-21 NOTE — Plan of Care (Signed)
D- Patient alert and oriented. Patient presents in a pleasant mood on assessment stating that she slept "amazing" last night and is continuing to endorse pain, rating it a "7/10" stating that her gums are numb. Patient also states that the "pain is still there, but I feel looser". Patient denies any signs/symptoms of depression and anxiety stating that "I'm just working through life, I'm good". Patient also denies SI, HI, AVH to this Clinical research associate. Patient's goal for today is "staying calm, cool, and collect", in which she will "pray" in order to accomplish her goal.  A- Scheduled medications administered to patient, per MD orders. Support and encouragement provided.  Routine safety checks conducted every 15 minutes.  Patient informed to notify staff with problems or concerns.  R- No adverse drug reactions noted. Patient contracts for safety at this time. Patient compliant with medications and treatment plan. Patient receptive, calm, and cooperative. Patient interacts well with others on the unit.  Patient remains safe at this time.  Problem: Education: Goal: Utilization of techniques to improve thought processes will improve Outcome: Progressing Goal: Knowledge of the prescribed therapeutic regimen will improve Outcome: Progressing   Problem: Activity: Goal: Interest or engagement in leisure activities will improve Outcome: Progressing Goal: Imbalance in normal sleep/wake cycle will improve Outcome: Progressing   Problem: Coping: Goal: Coping ability will improve Outcome: Progressing Goal: Will verbalize feelings Outcome: Progressing   Problem: Health Behavior/Discharge Planning: Goal: Ability to make decisions will improve Outcome: Progressing Goal: Compliance with therapeutic regimen will improve Outcome: Progressing   Problem: Safety: Goal: Ability to disclose and discuss suicidal ideas will improve Outcome: Progressing Goal: Ability to identify and utilize support systems that promote  safety will improve Outcome: Progressing   Problem: Self-Concept: Goal: Will verbalize positive feelings about self Outcome: Progressing Goal: Level of anxiety will decrease Outcome: Progressing   Problem: Education: Goal: Ability to make informed decisions regarding treatment will improve Outcome: Progressing   Problem: Coping: Goal: Coping ability will improve Outcome: Progressing   Problem: Health Behavior/Discharge Planning: Goal: Identification of resources available to assist in meeting health care needs will improve Outcome: Progressing   Problem: Medication: Goal: Compliance with prescribed medication regimen will improve Outcome: Progressing   Problem: Self-Concept: Goal: Ability to disclose and discuss suicidal ideas will improve Outcome: Progressing Goal: Will verbalize positive feelings about self Outcome: Progressing   Problem: Education: Goal: Knowledge of disease or condition will improve Outcome: Progressing Goal: Understanding of discharge needs will improve Outcome: Progressing   Problem: Health Behavior/Discharge Planning: Goal: Ability to identify changes in lifestyle to reduce recurrence of condition will improve Outcome: Progressing Goal: Identification of resources available to assist in meeting health care needs will improve Outcome: Progressing   Problem: Physical Regulation: Goal: Complications related to the disease process, condition or treatment will be avoided or minimized Outcome: Progressing   Problem: Safety: Goal: Ability to remain free from injury will improve Outcome: Progressing
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
## 2018-10-21 NOTE — Plan of Care (Signed)
  Problem: Coping: Goal: Coping ability will improve Outcome: Progressing Goal: Will verbalize feelings Outcome: Progressing  D: Patient has been tearful at times. Has complained of pain in her face and received Tylenol and Ibuprofen per prn order with relief. Also complained of stress incontinence and received extra clothes and towels so she could shower. Denies SI, HI and AV hallucinations. Nervous about going home and talked about the situation that brought her here. A: Continue to monitor for safety. R: Safety maintained.

## 2018-10-21 NOTE — [facility] Group Notes (Signed)
LCSW Group Therapy Note 10/21/2018 1:15pm  Type of Therapy and Topic: Group Therapy: Feelings Around Returning Home & Establishing a Supportive Framework and Supporting Oneself When Supports Not Available  Participation Level: Active  Description of Group:  Patients first processed thoughts and feelings about upcoming discharge. These included fears of upcoming changes, lack of change, new living environments, judgements and expectations from others and overall stigma of mental health issues. The group then discussed the definition of a supportive framework, what that looks and feels like, and how do to discern it from an unhealthy non-supportive network. The group identified different types of supports as well as what to do when your family/friends are less than helpful or unavailable  Therapeutic Goals  1. Patient will identify one healthy supportive network that they can use at discharge. 2. Patient will identify one factor of a supportive framework and how to tell it from an unhealthy network. 3. Patient able to identify one coping skill to use when they do not have positive supports from others. 4. Patient will demonstrate ability to communicate their needs through discussion and/or role plays.  Summary of Patient Progress:  The patient reported she feels "better." Pt engaged during group session. As patients processed their anxiety about discharge and described healthy supports patient shared she is ready to be discharge. She stated, "I'm ready to go home." She listed her therapist and psychiatrist as her main support.  Patients identified at least one self-care tool they were willing to use after discharge.   Therapeutic Modalities Cognitive Behavioral Therapy Motivational Interviewing   Elius Etheredge  [name], LCSW 10/21/2018 11:58 AM

## 2018-10-21 NOTE — Progress Notes (Signed)
D: Patient has been tearful at times. Has complained of pain in her face and received Tylenol and Ibuprofen per prn order with relief. Also complained of stress incontinence and received extra clothes and towels so she could shower. Denies SI, HI and AV hallucinations. Nervous about going home and talked about the situation that brought her here. A: Continue to monitor for safety. R: Safety maintained.

## 2018-10-22 MED ORDER — PANTOPRAZOLE SODIUM 40 MG PO TBEC: 40.0000 mg | DELAYED_RELEASE_TABLET | Freq: Every day | ORAL | 1 refills | Status: DC

## 2018-10-22 MED ORDER — OXCARBAZEPINE 600 MG PO TABS: 600.0000 mg | ORAL_TABLET | Freq: Two times a day (BID) | ORAL | 1 refills | Status: DC

## 2018-10-22 MED ORDER — ARIPIPRAZOLE ER 400 MG IM SRER: 400.0000 mg | INTRAMUSCULAR | 1 refills | Status: DC

## 2018-10-22 MED ORDER — TRAZODONE HCL 100 MG PO TABS: 100.0000 mg | ORAL_TABLET | Freq: Every day | ORAL | 1 refills | Status: DC

## 2018-10-22 MED ORDER — SERTRALINE HCL 50 MG PO TABS: 50.0000 mg | ORAL_TABLET | Freq: Every day | ORAL | 1 refills | Status: DC

## 2018-10-22 MED ORDER — HYDROXYZINE HCL 50 MG PO TABS: 50.0000 mg | ORAL_TABLET | Freq: Four times a day (QID) | ORAL | 1 refills | Status: DC | PRN

## 2018-10-22 MED ORDER — ARIPIPRAZOLE 20 MG PO TABS: 20.0000 mg | ORAL_TABLET | Freq: Every day | ORAL | 1 refills | Status: DC
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
## 2018-10-22 NOTE — Progress Notes (Signed)
Patient ID: Kelly Carr, female   DOB: 01-12-1971, 48 y.o.   MRN: [id number]  Discharge Note:  Patient denies SI/HI/AVH at this time. Discharge instructions, AVS, prescriptions, and transition record gone over with patient. Patient belongings returned to patient. Patient agrees to comply with medication management, follow-up visit, and outpatient therapy. Patient questions and concerns addressed and answered. Patient ambulatory off unit. Patient discharged to home via [employer] services.

## 2018-10-22 NOTE — Discharge Summary (Signed)
Physician Discharge Summary Note  Patient:  Kelly Carr is an 48 y.o., female MRN:  [id number] DOB:  1970-07-08 Patient phone:  [phone] (home)  Patient address:   [address] [place] [address],  Total Time spent with patient: 45 minutes  Date of Admission:  10/16/2018 Date of Discharge: Shinika 18, 2020  Reason for Admission: Admitted to the hospital because of agitated behavior reported bizarre behavior in the community poor self-care  Principal Problem: Bipolar 1 disorder, manic, moderate (HCC) Discharge Diagnoses: Principal Problem:   Bipolar 1 disorder, manic, moderate (HCC) Active Problems:   HLD (hyperlipidemia)   Orbital floor (blow-out) closed fracture (HCC)   Past Psychiatric History: Patient has a history of bipolar disorder and has had several recent admissions to the hospital  Past Medical History:  Past Medical History:  Diagnosis Date   Abnormal Pap smear    Unknown results>colpo>normal   Anxiety    Arthritis    Asthma    Bipolar 1 disorder (HCC)    Depression     Past Surgical History:  Procedure Laterality Date   NO PAST SURGERIES     Family History:  Family History  Problem Relation Age of Onset   Diabetes Mother    Hypertension Mother    Heart disease Mother 43   Schizophrenia Mother    Diabetes Maternal Grandmother    Heart disease Maternal Grandmother    Diabetes Maternal Grandfather    Heart disease Maternal Grandfather    Bipolar disorder Cousin    Bipolar disorder Nephew    Depression Daughter    Family Psychiatric  History: See previous Social History:  Social History   Substance and Sexual Activity  Alcohol Use Yes   Comment: "a lot"     Social History   Substance and Sexual Activity  Drug Use Not Currently    Social History   Socioeconomic History   Marital status: Single    Spouse name: Not on file   Number of children: Not on file   Years of education: Not on file   Highest  education level: Not on file  Occupational History   Not on file  Social Needs   Financial resource strain: Not on file   Food insecurity:    Worry: Not on file    Inability: Not on file   Transportation needs:    Medical: Not on file    Non-medical: Not on file  Tobacco Use   Smoking status: Current Every Day Smoker    Packs/day: 1.50    Types: Cigarettes, E-cigarettes   Smokeless tobacco: Never Used  Substance and Sexual Activity   Alcohol use: Yes    Comment: "a lot"   Drug use: Not Currently   Sexual activity: Yes    Partners: Male    Birth control/protection: None  Lifestyle   Physical activity:    Days per week: Not on file    Minutes per session: Not on file   Stress: Not on file  Relationships   Social connections:    Talks on phone: Not on file    Gets together: Not on file    Attends religious service: Not on file    Active member of club or organization: Not on file    Attends meetings of clubs or organizations: Not on file    Relationship status: Not on file  Other Topics Concern   Not on file  Social History Narrative   Not on file    Hospital Course:  Patient admitted to the hospital had symptoms of hypomania or mania.  Was continued on oral Abilify in addition to her other medicines previously prescribed for bipolar disorder.  It was thought that although she had recently gotten an Abilify long-acting injection she probably needed some time to transition to it.  Patient was initially hyperverbal and somewhat agitated on the unit but she calmed down and has shown appropriate behavior.  Not expressing psychotic thinking or behavior.  No evident delusions.  Denies suicidal or homicidal ideation.  Able to articulate a reasonable plan for the future.  Tolerating medicine and agreeable to follow-up treatment.  Her bruising on her face has significantly improved with no worsening of any of the injuries.  She is planning to follow-up with an orthopedist  for outpatient treatment.  Patient has received psychoeducation about substance abuse and medication management and agrees to follow-up with her provider in [place]  Physical Findings: AIMS: Facial and Oral Movements Muscles of Facial Expression: None, normal Lips and Perioral Area: None, normal Jaw: None, normal Tongue: None, normal,Extremity Movements Upper (arms, wrists, hands, fingers): None, normal Lower (legs, knees, ankles, toes): None, normal, Trunk Movements Neck, shoulders, hips: None, normal, Overall Severity Severity of abnormal movements (highest score from questions above): None, normal Incapacitation due to abnormal movements: None, normal Patient's awareness of abnormal movements (rate only patient's report): No Awareness, Dental Status Current problems with teeth and/or dentures?: No Does patient usually wear dentures?: No  CIWA:  CIWA-Ar Total: 3 COWS:  COWS Total Score: 3  Musculoskeletal: Strength & Muscle Tone: within normal limits Gait & Station: normal Patient leans: N/A  Psychiatric Specialty Exam: Physical Exam  Nursing note and vitals reviewed. Constitutional: She appears well-developed and well-nourished.  HENT:  Head: Normocephalic and atraumatic.  Eyes: Pupils are equal, round, and reactive to light. Conjunctivae are normal.  Neck: Normal range of motion.  Cardiovascular: Regular rhythm and normal heart sounds.  Respiratory: Effort normal.  GI: Soft.  Musculoskeletal: Normal range of motion.  Neurological: She is alert.  Skin: Skin is warm and dry.     Psychiatric: She has a normal mood and affect. Her speech is normal and behavior is normal. Judgment and thought content normal. Cognition and memory are normal.    Review of Systems  Constitutional: Negative.   HENT: Negative.   Eyes: Negative.   Respiratory: Negative.   Cardiovascular: Negative.   Gastrointestinal: Negative.   Musculoskeletal: Negative.   Skin: Negative.     Neurological: Negative.   Psychiatric/Behavioral: Negative.     Blood pressure 122/70, pulse 91, temperature 98.8 F (37.1 C), temperature source Oral, resp. rate 16, height  (1.651 m), weight 68.5 kg, last menstrual period 09/24/2018, SpO2 99 %.Body mass index is 25.13 kg/m.  General Appearance: Casual  Eye Contact:  Good  Speech:  Normal Rate  Volume:  Normal  Mood:  Euthymic  Affect:  Appropriate  Thought Process:  Goal Directed  Orientation:  Full (Time, Place, and Person)  Thought Content:  Logical  Suicidal Thoughts:  No  Homicidal Thoughts:  No  Memory:  Immediate;   Fair Recent;   Fair Remote;   Fair  Judgement:  Fair  Insight:  Fair  Psychomotor Activity:  Normal  Concentration:  Concentration: Fair  Recall:  [employer] of Knowledge:  Fair  Language:  Fair  Akathisia:  No  Handed:  Right  AIMS (if indicated):     Assets:  Desire for Improvement Housing Physical Health  ADL's:  Intact  Cognition:  WNL  Sleep:  Number of Hours: 4.25     Have you used any form of tobacco in the last 30 days? (Cigarettes, Smokeless Tobacco, Cigars, and/or Pipes): Yes  Has this patient used any form of tobacco in the last 30 days? (Cigarettes, Smokeless Tobacco, Cigars, and/or Pipes) Yes, Yes, A prescription for an FDA-approved tobacco cessation medication was offered at discharge and the patient refused  Blood Alcohol level:  Lab Results  Component Value Date   [facility] <10 10/15/2018   ETH <10 10/14/2018    Metabolic Disorder Labs:  Lab Results  Component Value Date   HGBA1C 5.0 10/09/2018   MPG 96.8 10/09/2018   MPG 96.8 03/21/2018   Lab Results  Component Value Date   PROLACTIN 39.5 (H) 04/12/2018   PROLACTIN 2.0 12/05/2011   Lab Results  Component Value Date   CHOL 215 (H) 10/09/2018   TRIG 83 10/09/2018   HDL 85 10/09/2018   CHOLHDL 2.5 10/09/2018   VLDL 17 10/09/2018   LDLCALC 113 (H) 10/09/2018   LDLCALC 118 (H) 03/21/2018    See Psychiatric  Specialty Exam and Suicide Risk Assessment completed by Attending Physician prior to discharge.  Discharge destination:  Home  Is patient on multiple antipsychotic therapies at discharge:  No   Has Patient had three or more failed trials of antipsychotic monotherapy by history:  No  Recommended Plan for Multiple Antipsychotic Therapies: NA  Discharge Instructions    Diet - low sodium heart healthy   Complete by:  As directed    Increase activity slowly   Complete by:  As directed      Allergies as of 10/22/2018      Reactions   Haldol [haloperidol Lactate] Other (See Comments)   Syncope   Risperidone And Related Other (See Comments)   Zones out   Tramadol Itching   Valproic Acid       Medication List    STOP taking these medications   nicotine 21 mg/24hr patch Commonly known as:  NICODERM CQ - dosed in mg/24 hours     TAKE these medications     Indication  ARIPiprazole 20 MG tablet Commonly known as:  ABILIFY Take 1 tablet (20 mg total) by mouth daily. What changed:  You were already taking a medication with the same name, and this prescription was added. Make sure you understand how and when to take each.  Indication:  MIXED BIPOLAR AFFECTIVE DISORDER   ARIPiprazole ER 400 MG Srer injection Commonly known as:  ABILIFY MAINTENA Inject 2 mLs (400 mg total) into the muscle every 28 (twenty-eight) days. Start taking on:  November 06, 2018 What changed:  Another medication with the same name was added. Make sure you understand how and when to take each.  Indication:  MIXED BIPOLAR AFFECTIVE DISORDER   hydrOXYzine 50 MG tablet Commonly known as:  ATARAX/VISTARIL Take 1 tablet (50 mg total) by mouth every 6 (six) hours as needed for itching or anxiety. What changed:    medication strength  how much to take  Indication:  Feeling Anxious   oxcarbazepine 600 MG tablet Commonly known as:  TRILEPTAL Take 1 tablet (600 mg total) by mouth 2 (two) times daily. What changed:   additional instructions  Indication:  Mood   pantoprazole 40 MG tablet Commonly known as:  PROTONIX Take 1 tablet (40 mg total) by mouth daily. Start taking on:  Oct 23, 2018  Indication:  Gastroesophageal Reflux Disease   sertraline  50 MG tablet Commonly known as:  ZOLOFT Take 1 tablet (50 mg total) by mouth daily.  Indication:  Major Depressive Disorder   traZODone 100 MG tablet Commonly known as:  DESYREL Take 1 tablet (100 mg total) by mouth at bedtime. What changed:    medication strength  how much to take  when to take this  reasons to take this  Indication:  Trouble Sleeping      Follow-up Information    [facility] Follow up on 11/09/2018.   Specialty:  Behavioral Health Why:  You have a medication management appointment with Melony Overlyeresa Hurst on 11/09/18 at 11AM. Please call [facility] at discharge to provide them with your phone number and to get instructions for appointment. Thank You! Contact information: [address] [place] [address] [phone]        [facility] BloodgoodLisa Shelton Follow up.   Why:  Please call and schedule an appointment with Thalia BloodgoodLisa Shelton within 3 days of discharge. Thank You! Contact information: [address] [place], [place] [address] Phone: [phone]          Follow-up recommendations:  Activity:  Activity as tolerated Diet:  Regular diet Other:  Follow-up with outpatient psychiatric care.  Comments: Patient was appropriate and agreeable to staying on medication following up with outpatient care and not abusing substances.  Signed: Mordecai RasmussenJohn Niklas Chretien, MD 10/22/2018, 9:33 AM

## 2018-10-22 NOTE — Tx Team (Signed)
Interdisciplinary Treatment and Diagnostic Plan Update  10/22/2018 Time of Session: 830AM Kelly Carr MRN: [id number]  Principal Diagnosis: Bipolar 1 disorder, manic, moderate (HCC)  Secondary Diagnoses: Principal Problem:   Bipolar 1 disorder, manic, moderate (HCC) Active Problems:   HLD (hyperlipidemia)   Orbital floor (blow-out) closed fracture (HCC)   Current Medications:  Current Facility-Administered Medications  Medication Dose Route Frequency Provider Last Rate Last Dose  . acetaminophen (TYLENOL) tablet 1,000 mg  1,000 mg Oral Q6H PRN [name], [name], MD   1,000 mg at 10/20/18 2115  . acetaminophen (TYLENOL) tablet 650 mg  650 mg Oral Q6H PRN Kelly Carr, Kelly M, MD   650 mg at 10/21/18 1610  . alum & mag hydroxide-simeth (MAALOX/MYLANTA) 200-200-20 MG/5ML suspension 30 mL  30 mL Oral Q4H PRN Kelly Carr, Kelly M, MD   30 mL at 10/21/18 2352  . ARIPiprazole (ABILIFY) tablet 20 mg  20 mg Oral Daily [name], [name], MD   20 mg at 10/22/18 [id number]  . [START ON 11/06/2018] ARIPiprazole ER (ABILIFY MAINTENA) injection 400 mg  400 mg Intramuscular Q28 days Kelly Carr, Kelly M, MD      . hydrOXYzine (ATARAX/VISTARIL) tablet 50 mg  50 mg Oral Q6H PRN Kelly Carr, Kelly M, MD   50 mg at 10/17/18 1729  . ibuprofen (ADVIL) tablet 800 mg  800 mg Oral Q8H PRN [name], [name], MD   800 mg at 10/22/18 0815  . magnesium hydroxide (MILK OF MAGNESIA) suspension 30 mL  30 mL Oral Daily PRN Kelly Carr, Kelly M, MD   30 mL at 10/21/18 0811  . nicotine (NICODERM CQ - dosed in mg/24 hours) patch 21 mg  21 mg Transdermal Daily Kelly Carr, Kelly M, MD   21 mg at 10/22/18 0813  . OLANZapine zydis (ZYPREXA) disintegrating tablet 10 mg  10 mg Oral Q8H PRN Kelly Carr, Kelly M, MD       And  . ziprasidone (GEODON) injection 20 mg  20 mg Intramuscular PRN Kelly Carr, Kelly M, MD      . Oxcarbazepine (TRILEPTAL) tablet 300 mg  300 mg Oral BID He, [name], MD   300 mg at 10/22/18 0813  . pantoprazole (PROTONIX) EC tablet 40 mg  40 mg Oral  Daily He, [name], MD   40 mg at 10/22/18 0813  . sertraline (ZOLOFT) tablet 50 mg  50 mg Oral Daily Kelly Carr, Kelly M, MD   50 mg at 10/22/18 [id number]  . traZODone (DESYREL) tablet 100 mg  100 mg Oral QHS He, [name], MD   100 mg at 10/21/18 2121  . traZODone (DESYREL) tablet 50 mg  50 mg Oral QHS PRN,MR X 1 Kelly Carr, Kelly M, MD   50 mg at 10/21/18 2355   PTA Medications: Medications Prior to Admission  Medication Sig Dispense Refill Last Dose  . ARIPiprazole ER (ABILIFY MAINTENA) 400 MG SRER injection Inject 2 mLs (400 mg total) into the muscle every 28 (twenty-eight) days. (Patient not taking: Reported on 10/08/2018) 1 each 2 Not Taking at Unknown time  . hydrOXYzine (ATARAX/VISTARIL) 25 MG tablet Take 1 tablet (25 mg total) by mouth every 6 (six) hours as needed for itching or anxiety. 30 tablet 0 prn at prn  . nicotine (NICODERM CQ - DOSED IN MG/24 HOURS) 21 mg/24hr patch Place 1 patch (21 mg total) onto the skin daily. For smoking cessation 28 patch 0 unknown at unknown  . Oxcarbazepine (TRILEPTAL) 600 MG tablet Take 1 tablet (600 mg total) by mouth 2 (two) times daily. For mood 60  tablet 0 unknown at unknown  . traZODone (DESYREL) 50 MG tablet Take 1 tablet (50 mg total) by mouth at bedtime as needed for sleep. 30 tablet 0 prn at prn  . [DISCONTINUED] sertraline (ZOLOFT) 50 MG tablet TAKE 1 TABLET BY MOUTH EVERY DAY 90 tablet 0 unknown at unknown    Patient Stressors: Health problems Marital or family conflict Substance abuse  Patient Strengths: Ability for insight Capable of independent living Psychologist, counselling means  Treatment Modalities: Medication Management, Group therapy, Case management,  1 to 1 session with clinician, Psychoeducation, Recreational therapy.   Physician Treatment Plan for Primary Diagnosis: Bipolar 1 disorder, manic, moderate (HCC) Long Term Goal(s): Improvement in symptoms so as ready for discharge Improvement in symptoms so as ready for discharge   Short  Term Goals: Ability to verbalize feelings will improve Ability to demonstrate self-control will improve Ability to identify and develop effective coping behaviors will improve Compliance with prescribed medications will improve  Medication Management: Evaluate patient's response, side effects, and tolerance of medication regimen.  Therapeutic Interventions: 1 to 1 sessions, Unit Group sessions and Medication administration.  Evaluation of Outcomes: Adequate for Discharge  Physician Treatment Plan for Secondary Diagnosis: Principal Problem:   Bipolar 1 disorder, manic, moderate (HCC) Active Problems:   HLD (hyperlipidemia)   Orbital floor (blow-out) closed fracture (HCC)  Long Term Goal(s): Improvement in symptoms so as ready for discharge Improvement in symptoms so as ready for discharge   Short Term Goals: Ability to verbalize feelings will improve Ability to demonstrate self-control will improve Ability to identify and develop effective coping behaviors will improve Compliance with prescribed medications will improve     Medication Management: Evaluate patient's response, side effects, and tolerance of medication regimen.  Therapeutic Interventions: 1 to 1 sessions, Unit Group sessions and Medication administration.  Evaluation of Outcomes: Adequate for Discharge   RN Treatment Plan for Primary Diagnosis: Bipolar 1 disorder, manic, moderate (HCC) Long Term Goal(s): Knowledge of disease and therapeutic regimen to maintain health will improve  Short Term Goals: Ability to demonstrate self-control, Ability to participate in decision making will improve and Ability to identify and develop effective coping behaviors will improve  Medication Management: RN will administer medications as ordered by provider, will assess and evaluate patient's response and provide education to patient for prescribed medication. RN will report any adverse and/or side effects to prescribing  provider.  Therapeutic Interventions: 1 on 1 counseling sessions, Psychoeducation, Medication administration, Evaluate responses to treatment, Monitor vital signs and CBGs as ordered, Perform/monitor CIWA, COWS, AIMS and Fall Risk screenings as ordered, Perform wound care treatments as ordered.  Evaluation of Outcomes: Adequate for Discharge   LCSW Treatment Plan for Primary Diagnosis: Bipolar 1 disorder, manic, moderate (HCC) Long Term Goal(s): Safe transition to appropriate next level of care at discharge, Engage patient in therapeutic group addressing interpersonal concerns.  Short Term Goals: Engage patient in aftercare planning with referrals and resources, Increase social support, Facilitate acceptance of mental health diagnosis and concerns and Increase skills for wellness and recovery  Therapeutic Interventions: Assess for all discharge needs, 1 to 1 time with Social worker, Explore available resources and support systems, Assess for adequacy in community support network, Educate family and significant other(s) on suicide prevention, Complete Psychosocial Assessment, Interpersonal group therapy.  Evaluation of Outcomes: Adequate for Discharge   Progress in Treatment: Attending groups: Yes. Participating in groups: Yes. Taking medication as prescribed: Yes. Toleration medication: Yes. Family/Significant other contact made: Yes, individual(s) contacted:  pts  daughter Patient understands diagnosis: Yes. Discussing patient identified problems/goals with staff: Yes. Medical problems stabilized or resolved: Yes. Denies suicidal/homicidal ideation: Yes. Issues/concerns per patient self-inventory: No. Other: N/A  New problem(s) identified: No, Describe:  none  New Short Term/Long Term Goal(s):  medication management for mood stabilization;  development of comprehensive mental wellness/sobriety plan.   Patient Goals:  "Have a plan and stick to it"  Discharge Plan or Barriers: SPE  pamphlet, Mobile Crisis information, and AA/NA information provided to patient for additional community support and resources. Pt has an appointment with [facility] on 5/15 at 11AM.   Reason for Continuation of Hospitalization: none  Estimated Length of Stay: Today 10/22/2018  Recreational Therapy: Patient Stressors: Family, Relationship Patient Goal: Patient will successfully identify 2 ways of making healthy decisions post d/c within 5 recreation therapy group sessions  Attendees: Patient: Kelly Carr 10/22/2018 9:34 AM  Physician:  10/22/2018 9:34 AM  Nursing:  10/22/2018 9:34 AM  RN Care Manager: 10/22/2018 9:34 AM  Social Worker: Zollie Scale Danelia Snodgrass LCSW 10/22/2018 9:34 AM  Recreational Therapist:  10/22/2018 9:34 AM  Other:  10/22/2018 9:34 AM  Other:  10/22/2018 9:34 AM  Other: 10/22/2018 9:34 AM    Scribe for Treatment Team: Charlann Lange Skyler Carel, LCSW 10/22/2018 9:34 AM

## 2018-10-22 NOTE — [facility] Suicide Risk Assessment (Signed)
[facility] Discharge Suicide Risk Assessment   Principal Problem: Bipolar 1 disorder, manic, moderate (HCC) Discharge Diagnoses: Principal Problem:   Bipolar 1 disorder, manic, moderate (HCC) Active Problems:   HLD (hyperlipidemia)   Orbital floor (blow-out) closed fracture (HCC)   Total Time spent with patient: 45 minutes  Musculoskeletal: Strength & Muscle Tone: within normal limits Gait & Station: normal Patient leans: N/A  Psychiatric Specialty Exam: Review of Systems  Constitutional: Negative.   HENT: Negative.   Eyes: Negative.   Respiratory: Negative.   Cardiovascular: Negative.   Gastrointestinal: Negative.   Musculoskeletal: Negative.   Skin: Negative.   Neurological: Negative.   Psychiatric/Behavioral: Negative for depression, hallucinations, memory loss, substance abuse and suicidal ideas. The patient is not nervous/anxious and does not have insomnia.     Blood pressure 122/70, pulse 91, temperature 98.8 F (37.1 C), temperature source Oral, resp. rate 16, height 5\' 5"  (1.651 m), weight 68.5 kg, last menstrual period 09/24/2018, SpO2 99 %.Body mass index is 25.13 kg/m.  General Appearance: Casual  Eye Contact::  Fair  Speech:  Normal Rate409  Volume:  Normal  Mood:  Euthymic  Affect:  Appropriate  Thought Process:  Coherent  Orientation:  Full (Time, Place, and Person)  Thought Content:  Logical  Suicidal Thoughts:  No  Homicidal Thoughts:  No  Memory:  Immediate;   Fair Recent;   Fair Remote;   Fair  Judgement:  Fair  Insight:  Fair  Psychomotor Activity:  Normal  Concentration:  Fair  Recall:  [employer] of Knowledge:Fair  Language: Fair  Akathisia:  No  Handed:  Right  AIMS (if indicated):     Assets:  Desire for Improvement Housing Resilience  Sleep:  Number of Hours: 4.25  Cognition: WNL  ADL's:  Intact   Mental Status Per Nursing Assessment::   On Admission:  NA  Demographic Factors:  Living alone  Loss Factors: Financial  problems/change in socioeconomic status  Historical Factors: Impulsivity  Risk Reduction Factors:   Religious beliefs about death and Positive therapeutic relationship  Continued Clinical Symptoms:  Bipolar Disorder:   Mixed State  Cognitive Features That Contribute To Risk:  None    Suicide Risk:  Minimal: No identifiable suicidal ideation.  Patients presenting with no risk factors but with morbid ruminations; may be classified as minimal risk based on the severity of the depressive symptoms  Follow-up Information    [facility] Follow up on 11/09/2018.   Specialty:  Behavioral Health Why:  You have a medication management appointment with Melony Overly on 11/09/18 at 11AM. Please call [facility] at discharge to provide them with your phone number and to get instructions for appointment. Thank You! Contact information: [address] [place] [address] [phone]        [facility] Bloodgood Follow up.   Why:  Please call and schedule an appointment with Thalia Bloodgood within 3 days of discharge. Thank You! Contact information: [address] [place], [place] [address] Phone: [phone]          Plan Of Care/Follow-up recommendations:  Activity:  Activity as tolerated Diet:  Regular diet Other:  Follow-up with outpatient care through your provider in [place].  Stay on psychiatric medication.  Do not abuse substances.  Mordecai Rasmussen, MD 10/22/2018, 9:24 AM

## 2018-10-22 NOTE — Progress Notes (Signed)
D: Patient denies SI, HI and AV hallucinations. Denies withdrawal symptoms. Is talking about plans for after discharge and how she is needing to cut some family members out of her life and appreciates her daughter being there for her. Got angry at someone on the phone and slammed the phone down. Calmed down and apologized. A: Continue to monitor for safety. R: Safety maintained.

## 2018-10-22 NOTE — Progress Notes (Signed)
  [facility] Adult Case Management Discharge Plan :  Will you be returning to the same living situation after discharge:  Yes,  home At discharge, do you have transportation home?: Yes,  taxi will be provided Do you have the ability to pay for your medications: Yes,  mental health  Release of information consent forms completed and in the chart;   Patient to Follow up at: Follow-up Information    Group, [facility] on 11/09/2018.   Specialty:  Behavioral Health Why:  You have a medication management appointment with Melony Overly on 11/09/18 at 11AM. Please call [facility] at discharge to provide them with your phone number and to get instructions for appointment. Thank You! Contact information: [address] [place] [address] [phone]        [facility] Follow up.   Why:  Please call and schedule an appointment with Thalia Bloodgood within 3 days of discharge. Thank You! Contact information: [address] [place], [place] [address] Phone: [phone]          Next level of care provider has access to [facility] Link:no  Safety Planning and Suicide Prevention discussed: Yes,  SPE completed with pts daughter  Have you used any form of tobacco in the last 30 days? (Cigarettes, Smokeless Tobacco, Cigars, and/or Pipes): Yes  Has patient been referred to the Quitline?: Patient refused referral  Patient has been referred for addiction treatment: N/A  Mechele Dawley, LCSW 10/22/2018, 9:08 AM

## 2018-10-22 NOTE — Progress Notes (Signed)
Recreation Therapy Notes  INPATIENT RECREATION TR PLAN  Patient Details Name: Kelly Carr MRN: [id number] DOB: 01/07/1971 Today's Date: 10/22/2018  Rec Therapy Plan Is patient appropriate for Therapeutic Recreation?: Yes Treatment times per week: at least 3  Estimated Length of Stay: 5-7 days TR Treatment/Interventions: Group participation (Comment)  Discharge Criteria Pt will be discharged from therapy if:: Discharged Treatment plan/goals/alternatives discussed and agreed upon by:: Patient/family  Discharge Summary Short term goals set: Patient will successfully identify 2 ways of making healthy decisions post d/c within 5 recreation therapy group sessions Short term goals met: Complete Progress toward goals comments: Groups attended Which groups?: Coping skills, Other (Comment)(Problem-Solving, Team work, Emotions ) Reason goals not met: N/A Therapeutic equipment acquired: N/A Reason patient discharged from therapy: Discharge from hospital Pt/family agrees with progress & goals achieved: Yes Date patient discharged from therapy: 10/22/18      10/22/2018, 11:46 AM  

## 2018-10-22 NOTE — Progress Notes (Signed)
Recreation Therapy Notes    Date: 10/22/2018  Time: 9:30 am  Location: Craft room  Behavioral response: Appropriate   Intervention Topic: Problem Solving  Discussion/Intervention:  Group content on today was focused on problem solving. The group described what problem solving is. Patients expressed how problems affect them and how they deal with problems. Individuals identified healthy ways to deal with problems. Patients explained what normally happens to them when they do not deal with problems. The group expressed reoccurring problems for them. The group participated in the intervention "Ways to Solve problems" where patients were given a chance to explore different ways to solve problems.   Clinical Observations/Feedback:  Patient came to group and stated problem solving is working through problems. She explained that she likes to talk things out to solve her problems. Participant expressed that solving problems keeps her healthy and responsible. Individual was social with peers and staff while participating in the intervention. Adden Strout LRT/CTRS         Malory Spurr 10/22/2018 11:26 AM

## 2018-10-30 ENCOUNTER — Emergency Department: Payer: Self-pay

## 2018-10-30 ENCOUNTER — Telehealth: Payer: Self-pay | Admitting: *Deleted

## 2018-10-30 ENCOUNTER — Other Ambulatory Visit: Payer: Self-pay

## 2018-10-30 ENCOUNTER — Emergency Department
Admission: EM | Admit: 2018-10-30 | Discharge: 2018-10-31 | Disposition: A | Discharge status: Home / Self Care | Payer: No Typology Code available for payment source | Attending: Emergency Medicine | Admitting: Emergency Medicine

## 2018-10-30 ENCOUNTER — Encounter: Payer: Self-pay | Admitting: Emergency Medicine

## 2018-10-30 DIAGNOSIS — F4310 Post-traumatic stress disorder, unspecified: Secondary | ICD-10-CM | POA: Diagnosis present

## 2018-10-30 DIAGNOSIS — F319 Bipolar disorder, unspecified: Secondary | ICD-10-CM | POA: Insufficient documentation

## 2018-10-30 DIAGNOSIS — Z046 Encounter for general psychiatric examination, requested by authority: Secondary | ICD-10-CM | POA: Insufficient documentation

## 2018-10-30 DIAGNOSIS — J45909 Unspecified asthma, uncomplicated: Secondary | ICD-10-CM | POA: Insufficient documentation

## 2018-10-30 DIAGNOSIS — F17210 Nicotine dependence, cigarettes, uncomplicated: Secondary | ICD-10-CM | POA: Insufficient documentation

## 2018-10-30 DIAGNOSIS — F23 Brief psychotic disorder: Secondary | ICD-10-CM

## 2018-10-30 DIAGNOSIS — Z20828 Contact with and (suspected) exposure to other viral communicable diseases: Secondary | ICD-10-CM | POA: Insufficient documentation

## 2018-10-30 DIAGNOSIS — F312 Bipolar disorder, current episode manic severe with psychotic features: Secondary | ICD-10-CM

## 2018-10-30 LAB — COMPREHENSIVE METABOLIC PANEL
ALT: 26 U/L (ref 0–44)
AST: 29 U/L (ref 15–41)
Albumin: 3.6 g/dL (ref 3.5–5.0)
Alkaline Phosphatase: 97 U/L (ref 38–126)
Anion gap: 5 (ref 5–15)
BUN: 9 mg/dL (ref 6–20)
CO2: 27 mmol/L (ref 22–32)
Calcium: 8.8 mg/dL — ABNORMAL LOW (ref 8.9–10.3)
Chloride: 107 mmol/L (ref 98–111)
Creatinine, Ser: 0.57 mg/dL (ref 0.44–1.00)
GFR calc Af Amer: >60 mL/min (ref >60)
GFR calc non Af Amer: >60 mL/min (ref >60)
Glucose, Bld: 91 mg/dL (ref 70–99)
Potassium: 3.7 mmol/L (ref 3.5–5.1)
Sodium: 139 mmol/L (ref 135–145)
Total Bilirubin: 0.5 mg/dL (ref 0.3–1.2)
Total Protein: 7 g/dL (ref 6.5–8.1)

## 2018-10-30 LAB — SALICYLATE LEVEL: Salicylate Lvl: <7 mg/dL (ref 2.8–30.0)

## 2018-10-30 LAB — SARS CORONAVIRUS 2 BY RT PCR (HOSPITAL ORDER, PERFORMED IN ~~LOC~~ HOSPITAL LAB): SARS Coronavirus 2: NEGATIVE

## 2018-10-30 LAB — CBC
HCT: 34 % — ABNORMAL LOW (ref 36.0–46.0)
Hemoglobin: 10.7 g/dL — ABNORMAL LOW (ref 12.0–15.0)
MCH: 28 pg (ref 26.0–34.0)
MCHC: 31.5 g/dL (ref 30.0–36.0)
MCV: 89 fL (ref 80.0–100.0)
Platelets: 459 10*3/uL — ABNORMAL HIGH (ref 150–400)
RBC: 3.82 MIL/uL — ABNORMAL LOW (ref 3.87–5.11)
RDW: 13.1 % (ref 11.5–15.5)
WBC: 13.3 10*3/uL — ABNORMAL HIGH (ref 4.0–10.5)
nRBC: 0 % (ref 0.0–0.2)

## 2018-10-30 LAB — ACETAMINOPHEN LEVEL: Acetaminophen (Tylenol), Serum: <10 ug/mL — ABNORMAL LOW (ref 10–30)

## 2018-10-30 LAB — ETHANOL: Alcohol, Ethyl (B): <10 mg/dL (ref <10)

## 2018-10-30 MED ORDER — LORAZEPAM 2 MG/ML IJ SOLN
2.0000 mg | Freq: Once | INTRAMUSCULAR | Status: AC
  Administered 2018-10-30: 2 mg via INTRAMUSCULAR
  Filled 2018-10-30: qty 1

## 2018-10-30 MED ORDER — HALOPERIDOL LACTATE 5 MG/ML IJ SOLN
10.0000 mg | Freq: Once | INTRAMUSCULAR | Status: AC
  Administered 2018-10-30: 10 mg via INTRAMUSCULAR
  Filled 2018-10-30: qty 2

## 2018-10-30 MED ORDER — DIPHENHYDRAMINE HCL 50 MG/ML IJ SOLN
50.0000 mg | Freq: Once | INTRAMUSCULAR | Status: AC
  Administered 2018-10-30: 50 mg via INTRAMUSCULAR
  Filled 2018-10-30: qty 1
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
## 2018-10-30 NOTE — ED Notes (Signed)
Patient's belongings: black hair tie with gold, black, and white ribbons; gold color ring with black colored stone, silver colored ring, silver colored owl, silver colored earrings, black stone bracelet, clear bead bracelet, silver colored braided bracelet, silver colored bangle bracelet, silver colored bracelet with teal, silver and black necklace, gold colored heart necklace with clear stone, silver colored necklace with silver ring attached; black shirt, sandals, jean shorts, black bra, underwear.
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
## 2018-10-30 NOTE — Telephone Encounter (Signed)
Pt called with questions about her knee.  Returned call x 2 but automated system said "call could not be completed at this time". Will await callback. Jone Baseman, CMA

## 2018-10-31 ENCOUNTER — Inpatient Hospital Stay
Admission: AD | Admit: 2018-10-31 | Discharge: 2018-11-14 | DRG: 885 | Disposition: A | Discharge status: Home / Self Care | Payer: No Typology Code available for payment source | Source: Intra-hospital | Attending: Psychiatry | Admitting: Psychiatry

## 2018-10-31 ENCOUNTER — Emergency Department: Payer: Self-pay

## 2018-10-31 DIAGNOSIS — Z9114 Patient's other noncompliance with medication regimen: Secondary | ICD-10-CM

## 2018-10-31 DIAGNOSIS — Z888 Allergy status to other drugs, medicaments and biological substances status: Secondary | ICD-10-CM

## 2018-10-31 DIAGNOSIS — Z9119 Patient's noncompliance with other medical treatment and regimen: Secondary | ICD-10-CM

## 2018-10-31 DIAGNOSIS — J45909 Unspecified asthma, uncomplicated: Secondary | ICD-10-CM | POA: Diagnosis present

## 2018-10-31 DIAGNOSIS — F1210 Cannabis abuse, uncomplicated: Secondary | ICD-10-CM | POA: Diagnosis present

## 2018-10-31 DIAGNOSIS — M7989 Other specified soft tissue disorders: Secondary | ICD-10-CM | POA: Diagnosis not present

## 2018-10-31 DIAGNOSIS — G4700 Insomnia, unspecified: Secondary | ICD-10-CM | POA: Diagnosis present

## 2018-10-31 DIAGNOSIS — Z885 Allergy status to narcotic agent status: Secondary | ICD-10-CM | POA: Diagnosis not present

## 2018-10-31 DIAGNOSIS — M25569 Pain in unspecified knee: Secondary | ICD-10-CM | POA: Diagnosis present

## 2018-10-31 DIAGNOSIS — G8929 Other chronic pain: Secondary | ICD-10-CM | POA: Diagnosis present

## 2018-10-31 DIAGNOSIS — Z79899 Other long term (current) drug therapy: Secondary | ICD-10-CM

## 2018-10-31 DIAGNOSIS — F419 Anxiety disorder, unspecified: Secondary | ICD-10-CM | POA: Diagnosis present

## 2018-10-31 DIAGNOSIS — Z91410 Personal history of adult physical and sexual abuse: Secondary | ICD-10-CM | POA: Diagnosis not present

## 2018-10-31 DIAGNOSIS — K219 Gastro-esophageal reflux disease without esophagitis: Secondary | ICD-10-CM | POA: Diagnosis present

## 2018-10-31 DIAGNOSIS — F3113 Bipolar disorder, current episode manic without psychotic features, severe: Secondary | ICD-10-CM | POA: Diagnosis not present

## 2018-10-31 DIAGNOSIS — F4310 Post-traumatic stress disorder, unspecified: Secondary | ICD-10-CM | POA: Diagnosis not present

## 2018-10-31 DIAGNOSIS — M1990 Unspecified osteoarthritis, unspecified site: Secondary | ICD-10-CM | POA: Diagnosis present

## 2018-10-31 DIAGNOSIS — Z91199 Patient's noncompliance with other medical treatment and regimen due to unspecified reason: Secondary | ICD-10-CM

## 2018-10-31 DIAGNOSIS — F17210 Nicotine dependence, cigarettes, uncomplicated: Secondary | ICD-10-CM | POA: Diagnosis present

## 2018-10-31 DIAGNOSIS — F17290 Nicotine dependence, other tobacco product, uncomplicated: Secondary | ICD-10-CM | POA: Diagnosis present

## 2018-10-31 DIAGNOSIS — Z818 Family history of other mental and behavioral disorders: Secondary | ICD-10-CM | POA: Diagnosis not present

## 2018-10-31 DIAGNOSIS — Z833 Family history of diabetes mellitus: Secondary | ICD-10-CM

## 2018-10-31 DIAGNOSIS — F312 Bipolar disorder, current episode manic severe with psychotic features: Principal | ICD-10-CM | POA: Diagnosis present

## 2018-10-31 DIAGNOSIS — Z8249 Family history of ischemic heart disease and other diseases of the circulatory system: Secondary | ICD-10-CM | POA: Diagnosis not present

## 2018-10-31 LAB — URINE DRUG SCREEN, QUALITATIVE (ARMC ONLY)
Amphetamines, Ur Screen: NOT DETECTED
Barbiturates, Ur Screen: NOT DETECTED
Benzodiazepine, Ur Scrn: POSITIVE — AB
Cannabinoid 50 Ng, Ur ~~LOC~~: POSITIVE — AB
Cocaine Metabolite,Ur ~~LOC~~: NOT DETECTED
MDMA (Ecstasy)Ur Screen: NOT DETECTED
Methadone Scn, Ur: NOT DETECTED
Opiate, Ur Screen: NOT DETECTED
Phencyclidine (PCP) Ur S: NOT DETECTED
Tricyclic, Ur Screen: POSITIVE — AB

## 2018-10-31 LAB — POCT PREGNANCY, URINE: Preg Test, Ur: NEGATIVE

## 2018-10-31 MED ORDER — HYDROXYZINE HCL 50 MG PO TABS
50.0000 mg | ORAL_TABLET | Freq: Four times a day (QID) | ORAL | Status: DC | PRN
  Administered 2018-10-31 – 2018-11-11 (×22): 50 mg via ORAL
  Filled 2018-10-31 (×24): qty 1

## 2018-10-31 MED ORDER — ZIPRASIDONE MESYLATE 20 MG IM SOLR
20.0000 mg | INTRAMUSCULAR | Status: AC | PRN
  Administered 2018-11-05: 20 mg via INTRAMUSCULAR
  Filled 2018-10-31: qty 20

## 2018-10-31 MED ORDER — ALUM & MAG HYDROXIDE-SIMETH 200-200-20 MG/5ML PO SUSP: 30.0000 mL | ORAL | Status: DC | PRN

## 2018-10-31 MED ORDER — OXCARBAZEPINE 300 MG PO TABS
600.0000 mg | ORAL_TABLET | Freq: Two times a day (BID) | ORAL | Status: DC
  Administered 2018-10-31 – 2018-11-01 (×2): 600 mg via ORAL
  Filled 2018-10-31 (×2): qty 2

## 2018-10-31 MED ORDER — ARIPIPRAZOLE 10 MG PO TABS
20.0000 mg | ORAL_TABLET | Freq: Every day | ORAL | Status: DC
  Administered 2018-10-31 – 2018-11-04 (×5): 20 mg via ORAL
  Filled 2018-10-31 (×5): qty 2

## 2018-10-31 MED ORDER — TRAZODONE HCL 100 MG PO TABS
100.0000 mg | ORAL_TABLET | Freq: Every day | ORAL | Status: DC
  Administered 2018-10-31 – 2018-11-13 (×14): 100 mg via ORAL
  Filled 2018-10-31 (×15): qty 1

## 2018-10-31 MED ORDER — LORAZEPAM 1 MG PO TABS
1.0000 mg | ORAL_TABLET | ORAL | Status: AC | PRN
  Administered 2018-11-02: 1 mg via ORAL
  Filled 2018-10-31: qty 1

## 2018-10-31 MED ORDER — OLANZAPINE 5 MG PO TBDP
10.0000 mg | ORAL_TABLET | Freq: Three times a day (TID) | ORAL | Status: DC | PRN
  Administered 2018-11-03 – 2018-11-10 (×6): 10 mg via ORAL
  Filled 2018-10-31 (×6): qty 2

## 2018-10-31 MED ORDER — PANTOPRAZOLE SODIUM 40 MG PO TBEC
40.0000 mg | DELAYED_RELEASE_TABLET | Freq: Every day | ORAL | Status: DC
  Administered 2018-10-31 – 2018-11-14 (×15): 40 mg via ORAL
  Filled 2018-10-31 (×15): qty 1

## 2018-10-31 MED ORDER — SERTRALINE HCL 25 MG PO TABS
50.0000 mg | ORAL_TABLET | Freq: Every day | ORAL | Status: DC
  Administered 2018-10-31 – 2018-11-07 (×8): 50 mg via ORAL
  Filled 2018-10-31 (×8): qty 2

## 2018-10-31 MED ORDER — MAGNESIUM HYDROXIDE 400 MG/5ML PO SUSP
30.0000 mL | Freq: Every day | ORAL | Status: DC | PRN
  Administered 2018-11-02: 30 mL via ORAL
  Filled 2018-10-31: qty 30

## 2018-10-31 MED ORDER — ACETAMINOPHEN 325 MG PO TABS
650.0000 mg | ORAL_TABLET | Freq: Four times a day (QID) | ORAL | Status: DC | PRN
  Administered 2018-10-31 – 2018-11-12 (×23): 650 mg via ORAL
  Filled 2018-10-31 (×24): qty 2

## 2018-10-31 MED ORDER — ARIPIPRAZOLE ER 400 MG IM SRER: 400.0000 mg | INTRAMUSCULAR | Status: DC

## 2018-10-31 NOTE — ED Notes (Signed)

## 2018-10-31 NOTE — ED Notes (Signed)
She is currently in the shower 

## 2018-10-31 NOTE — Progress Notes (Signed)
Admission Note:   Report was received on a 48 year old female who presents IVC in no acute distress for the treatment of bizarre behavior, it was reported that she had all of her belongings in a shopping cart and was going up and down the street and was referred from [facility] to come here. 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 Patient was calm and cooperative with admission process. Patient denies SI/HI/AVH. Per chart, patient has a past medical history of Asthma, Anxiety, and Arthritis. Skin was assessed and found to be clear of any abnormal marks apart from a healing bruise on her upper left thigh. Patient searched with Aundra Millet, RN and no contraband found and unit policies explained and understanding verbalized. Consents obtained. Food and fluids offered and accepted. Patientt had no additional questions or concerns at this time.
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
## 2018-10-31 NOTE — ED Notes (Signed)
Pt assisted to bathroom. Pt given wipes to clean self off as pt reports she is on her period. Pt given new briefs and pants at this time. Bed sheets changed by Maralyn Sago, NT.
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
## 2018-10-31 NOTE — ED Notes (Signed)
Pt transferred into ED BHU room 2   Patient assigned to appropriate care area. Plan of care discussed  Patient oriented to unit/care area: Informed that, for their safety, care areas are designed for safety and monitored by security cameras at all times; phone times explained to patient. Patient verbalizes understanding, and verbal contract for safety obtained.   Pt immediately asking for a shower   - shower items provided

## 2018-10-31 NOTE — Plan of Care (Signed)
Pt was oriented to the unit. Pt was educated on care plan and verbalizes understanding. Will continue to monitor and remain safe. Torrie Mayers RN Problem: Education: Goal: Ability to state activities that reduce stress will improve Outcome: Not Progressing   Problem: Self-Concept: Goal: Ability to identify factors that promote anxiety will improve Outcome: Not Progressing Goal: Level of anxiety will decrease Outcome: Not Progressing Goal: Ability to modify response to factors that promote anxiety will improve Outcome: Not Progressing   Problem: Activity: Goal: Interest or engagement in leisure activities will improve Outcome: Not Progressing Goal: Imbalance in normal sleep/wake cycle will improve Outcome: Not Progressing   Problem: Coping: Goal: Coping ability will improve Outcome: Not Progressing Goal: Will verbalize feelings Outcome: Not Progressing   Problem: Health Behavior/Discharge Planning: Goal: Ability to make decisions will improve Outcome: Not Progressing Goal: Compliance with therapeutic regimen will improve Outcome: Not Progressing   Problem: Safety: Goal: Ability to disclose and discuss suicidal ideas will improve Outcome: Not Progressing Goal: Ability to identify and utilize support systems that promote safety will improve Outcome: Not Progressing   Problem: Education: Goal: Will be free of psychotic symptoms Outcome: Not Progressing Goal: Knowledge of the prescribed therapeutic regimen will improve Outcome: Not Progressing   Problem: Health Behavior/Discharge Planning: Goal: Compliance with prescribed medication regimen will improve Outcome: Not Progressing   Problem: Self-Care: Goal: Ability to participate in self-care as condition permits will improve Outcome: Not Progressing

## 2018-10-31 NOTE — BH Assessment (Signed)
Patient is to be admitted to [facility] by Dr. Viviano Simas.  Attending Physician will be Dr. Toni Amend.   Patient has been assigned to room 316, by [facility] Charge Nurse Demetria.   ER staff is aware of the admission:  Dr. Roxan Hockey, ER MD   Amy T., Patient's Nurse   Sharmon Leyden, Patient Access

## 2018-10-31 NOTE — ED Notes (Signed)
Patient lying in bed and appears to be asleep, NAD observed and will continue to monitor 

## 2018-10-31 NOTE — ED Notes (Signed)
ED  Is the patient under IVC or is there intent for IVC: Yes.   Is the patient medically cleared: Yes.   Is there vacancy in the ED BHU: Yes.   Is the population mix appropriate for patient: Yes.   Is the patient awaiting placement in inpatient or outpatient setting: Yes. Awaiting transfer to [facility] Has the patient had a psychiatric consult: Yes.   Survey of unit performed for contraband, proper placement and condition of furniture, tampering with fixtures in bathroom, shower, and each patient room: Yes.  ; Findings:  APPEARANCE/BEHAVIOR Calm and cooperative NEURO ASSESSMENT Orientation: oriented x3  Denies pain Hallucinations: No.None noted (Hallucinations)  denies Speech: Normal Gait: normal RESPIRATORY ASSESSMENT Even  Unlabored respirations  CARDIOVASCULAR ASSESSMENT Pulses equal   regular rate  Skin warm and dry   GASTROINTESTINAL ASSESSMENT no GI complaint EXTREMITIES Full ROM  PLAN OF CARE Provide calm/safe environment. Vital signs assessed twice daily. ED BHU Assessment once each 12-hour shift. Collaborate with TTS daily or as condition indicates. Assure the ED provider has rounded once each shift. Provide and encourage hygiene. Provide redirection as needed. Assess for escalating behavior; address immediately and inform ED provider.  Assess family dynamic and appropriateness for visitation as needed: Yes.  ; If necessary, describe findings:  Educate the patient/family about BHU procedures/visitation: Yes.  ; If necessary, describe findings:

## 2018-10-31 NOTE — ED Notes (Signed)
BEHAVIORAL HEALTH ROUNDING Patient sleeping: No. Patient alert and oriented: yes Behavior appropriate: Yes.  ; If no, describe:  Nutrition and fluids offered: yes Toileting and hygiene offered: Yes  Sitter present: q15 minute observations and security  monitoring Law enforcement present: Yes  ODS  

## 2018-10-31 NOTE — ED Notes (Signed)
Patient in with xray
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
## 2018-10-31 NOTE — Tx Team (Signed)
Initial Treatment Plan 10/31/2018 5:59 PM Kimberlynn Wamble OFB:[id number]    PATIENT STRESSORS: Medication change or noncompliance Other: Covid 19 worries    PATIENT STRENGTHS: Physical Health Work skills   PATIENT IDENTIFIED PROBLEMS: pychosis    Anxiety   Depression                  DISCHARGE CRITERIA:  Improved stabilization in mood, thinking, and/or behavior Reduction of life-threatening or endangering symptoms to within safe limits Verbal commitment to aftercare and medication compliance  PRELIMINARY DISCHARGE PLAN: Outpatient therapy Return to previous living arrangement Return to previous work or school arrangements  PATIENT/FAMILY INVOLVEMENT: This treatment plan has been presented to and reviewed with the patient, Elica Dimascio.The patient has been given the opportunity to ask questions and make suggestions.  Chalmers Cater, RN 10/31/2018, 5:59 PM

## 2018-11-01 DIAGNOSIS — Z91199 Patient's noncompliance with other medical treatment and regimen due to unspecified reason: Secondary | ICD-10-CM

## 2018-11-01 DIAGNOSIS — F3113 Bipolar disorder, current episode manic without psychotic features, severe: Secondary | ICD-10-CM

## 2018-11-01 DIAGNOSIS — F1210 Cannabis abuse, uncomplicated: Secondary | ICD-10-CM

## 2018-11-01 DIAGNOSIS — Z9119 Patient's noncompliance with other medical treatment and regimen: Secondary | ICD-10-CM

## 2018-11-01 MED ORDER — ARIPIPRAZOLE ER 400 MG IM SRER
400.0000 mg | INTRAMUSCULAR | Status: DC
  Administered 2018-11-01: 400 mg via INTRAMUSCULAR
  Filled 2018-11-01: qty 2

## 2018-11-01 MED ORDER — TRAMADOL HCL 50 MG PO TABS: 50.0000 mg | ORAL_TABLET | Freq: Four times a day (QID) | ORAL | Status: DC | PRN

## 2018-11-01 MED ORDER — LITHIUM CARBONATE ER 300 MG PO TBCR
600.0000 mg | EXTENDED_RELEASE_TABLET | Freq: Every day | ORAL | Status: DC
  Administered 2018-11-01 – 2018-11-04 (×4): 600 mg via ORAL
  Filled 2018-11-01 (×7): qty 2

## 2018-11-01 MED ORDER — NICOTINE 21 MG/24HR TD PT24
21.0000 mg | MEDICATED_PATCH | Freq: Every day | TRANSDERMAL | Status: DC
  Administered 2018-11-01 – 2018-11-14 (×14): 21 mg via TRANSDERMAL
  Filled 2018-11-01 (×15): qty 1

## 2018-11-01 NOTE — [facility] Suicide Risk Assessment (Signed)
[facility] INPATIENT:  Family/Significant Other Suicide Prevention Education  Suicide Prevention Education:  Contact Attempts: Kelly Carr, daughter, [phone] has been identified by the patient as the family member/significant other with whom the patient will be residing, and identified as the person(s) who will aid the patient in the event of a mental health crisis.  With written consent from the patient, two attempts were made to provide suicide prevention education, prior to and/or following the patient's discharge.  We were unsuccessful in providing suicide prevention education.  A suicide education pamphlet was given to the patient to share with family/significant other.  Date and time of first attempt: 11/01/2018 at 10:29AM Date and time of second attempt: Second attempt is needed  Kelly Carr 11/01/2018, 10:28 AM

## 2018-11-01 NOTE — Plan of Care (Signed)
Pt states sleep as "amazing". Pt rates depression 0/10 and anxiety 4/10. Pt denies SI, HI and AVH. Pt has a goal to "relax". Pt was educated on care plan and verbalizes understanding. Torrie Mayers RN Problem: Education: Goal: Ability to state activities that reduce stress will improve Outcome: Progressing   Problem: Self-Concept: Goal: Ability to identify factors that promote anxiety will improve Outcome: Progressing Goal: Level of anxiety will decrease Outcome: Not Progressing Goal: Ability to modify response to factors that promote anxiety will improve Outcome: Not Progressing   Problem: Activity: Goal: Interest or engagement in leisure activities will improve Outcome: Progressing Goal: Imbalance in normal sleep/wake cycle will improve Outcome: Progressing   Problem: Coping: Goal: Coping ability will improve Outcome: Progressing Goal: Will verbalize feelings Outcome: Progressing   Problem: Health Behavior/Discharge Planning: Goal: Ability to make decisions will improve Outcome: Progressing Goal: Compliance with therapeutic regimen will improve Outcome: Progressing   Problem: Safety: Goal: Ability to disclose and discuss suicidal ideas will improve Outcome: Progressing Goal: Ability to identify and utilize support systems that promote safety will improve Outcome: Progressing   Problem: Education: Goal: Will be free of psychotic symptoms Outcome: Progressing Goal: Knowledge of the prescribed therapeutic regimen will improve Outcome: Progressing   Problem: Health Behavior/Discharge Planning: Goal: Compliance with prescribed medication regimen will improve Outcome: Progressing   Problem: Self-Care: Goal: Ability to participate in self-care as condition permits will improve Outcome: Progressing

## 2018-11-01 NOTE — Progress Notes (Signed)
Recreation Therapy Notes  Date: 11/01/2018  Time: 9:30 am  Location: Craft room  Behavioral response: Appropriate   Intervention Topic: Time Management   Discussion/Intervention:  Group content today was focused on time management. The group defined time management and identified healthy ways to manage time. Individuals expressed how much of the 24 hours they use in a day. Patients expressed how much time they use just for themselves personally. The group expressed how they have managed their time in the past. Individuals participated in the intervention "Managing Life" where they had a chance to see how much of the 24 hours they use and where it goes.  Clinical Observations/Feedback:  Patient came to group late and explained that time management is important to get things done and accomplish goals. She explained that she manages her time by using a monthly planner. Individual was social with staff and peers while participating in the intervention. Aul Mangieri LRT/CTRS         Aric Jost 11/01/2018 1:07 PM

## 2018-11-01 NOTE — [facility] Group Notes (Signed)
Balance In Life 11/01/2018 1PM  Type of Therapy/Topic:  Group Therapy:  Balance in Life  Participation Level:  Active  Description of Group:   This group will address the concept of balance and how it feels and looks when one is unbalanced. Patients will be encouraged to process areas in their lives that are out of balance and identify reasons for remaining unbalanced. Facilitators will guide patients in utilizing problem-solving interventions to address and correct the stressor making their life unbalanced. Understanding and applying boundaries will be explored and addressed for obtaining and maintaining a balanced life. Patients will be encouraged to explore ways to assertively make their unbalanced needs known to significant others in their lives, using other group members and facilitator for support and feedback.  Therapeutic Goals: 1. Patient will identify two or more emotions or situations they have that consume much of in their lives. 2. Patient will identify signs/triggers that life has become out of balance:  3. Patient will identify two ways to set boundaries in order to achieve balance in their lives:  4. Patient will demonstrate ability to communicate their needs through discussion and/or role plays  Summary of Patient Progress: Actively and appropriately engaged in the group. Patient was able to provide support and validation to other group members. Patient discussed with the group the need to establish healthy boundaries with her family members to find balance in life. Patient says she will work on improving communication with her family when she is discharged and returns home.    Therapeutic Modalities:   Cognitive Behavioral Therapy Solution-Focused Therapy Assertiveness Training  Aarik Blank Philip Aspen, LCSW
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
## 2018-11-02 MED ORDER — MELOXICAM 7.5 MG PO TABS
15.0000 mg | ORAL_TABLET | Freq: Every day | ORAL | Status: DC
  Administered 2018-11-02 – 2018-11-14 (×13): 15 mg via ORAL
  Filled 2018-11-02 (×13): qty 2

## 2018-11-02 NOTE — Plan of Care (Signed)
Patient is alert and oriented X 3, speech is not pressured today; patient can be labile, cursing at staff members when not given way. Patient has been instructed about clothing, not wearing short shorts on the unit or wearing off the shoulder tops. Patient received scrubs and a new t-shirt from staff. Patient likes to become involved in peers personal affairs on the unit which can lead to confusion and frustration from others. Patient also very vigilant at the nurses station asking for any medication available. This morning patient received tylenol citing hip pain,  two hours later patient came back and asked for more tylenol, RN informed patient it is too soon for the medication to be given. Patient then asked for milk of mag, stating " I had a BM yesterday, but it was hard." Patient is attending groups, and has to be reminded of rules on the unit. Safety checks to continue Q 15 minutes. Problem: Education: Goal: Ability to state activities that reduce stress will improve Outcome: Progressing   Problem: Activity: Goal: Interest or engagement in leisure activities will improve Outcome: Progressing Goal: Imbalance in normal sleep/wake cycle will improve Outcome: Progressing   Problem: Coping: Goal: Coping ability will improve Outcome: Progressing Goal: Will verbalize feelings Outcome: Progressing

## 2018-11-02 NOTE — Progress Notes (Signed)
Recreation Therapy Notes   Date: 11/02/2018  Time: 9:30 am  Location: Craft room  Behavioral response: Appropriate   Intervention Topic: Leisure   Discussion/Intervention:  Group content today was focused on leisure. The group defined what leisure is and some positive leisure activities they participate in. Individuals identified the difference between good and bad leisure. Participants expressed how they feel after participating in the leisure of their choice. The group discussed how they go about picking a leisure activity and if others are involved in their leisure activities. The patient stated how many leisure activities they too choose from and reasons why it is important to have leisure time. Individuals participated in the intervention "Exploration of Leisure" where they had a chance to identify new leisure activities as well as benefits of leisure.  Clinical Observations/Feedback:  Patient came to group and defined leisure as down time. She stated she likes to listen to music, read the bible and pray. Participant explained that it is hard to find time for leisure. Patient expressed that leisure is important to do something that makes you happy. Individual was social with staff and peers while participating in the intervention. Mckenley Birenbaum LRT/CTRS         Antonae Zbikowski 11/02/2018 1:01 PM

## 2018-11-02 NOTE — [facility] Suicide Risk Assessment (Signed)
[facility] INPATIENT:  Family/Significant Other Suicide Prevention Education  Suicide Prevention Education:  Education Completed; Cecile Sheerer, daughter, [phone] has been identified by the patient as the family member/significant other with whom the patient will be residing, and identified as the person(s) who will aid the patient in the event of a mental health crisis (suicidal ideations/suicide attempt).  With written consent from the patient, the family member/significant other has been provided the following suicide prevention education, prior to the and/or following the discharge of the patient.  The suicide prevention education provided includes the following:  Suicide risk factors  Suicide prevention and interventions  National Suicide Hotline telephone number  [facility] assessment telephone number  [facility] Emergency Assistance 911  [facility] and/or Residential Mobile Crisis Unit telephone number  Request made of family/significant other to:  Remove weapons (e.g., guns, rifles, knives), all items previously/currently identified as safety concern.    Remove drugs/medications (over-the-counter, prescriptions, illicit drugs), all items previously/currently identified as a safety concern.  The family member/significant other verbalizes understanding of the suicide prevention education information provided.  The family member/significant other agrees to remove the items of safety concern listed above.  Daughter reports that "she is not better".  Daughter reports that "she gets out and does the same old thing that she has been doing".  Daughter reports "I know it takes a while for medications to build up in her system but...".  Daughter reports that she is not aware of any weapons in the home.  Daughter reports that conversation has to be short due to being at work and not being able to talk at length.  CSW assessed for any concerns and daughter reports that  she wasn't sure, after CSW clarified what was meant by concerns, the daughter did not report any.    Harden Mo 11/02/2018, 3:04 PM

## 2018-11-02 NOTE — Plan of Care (Signed)
Patient was upset this evening at another patient. Patient was de-escalated and given education.   Problem: Self-Concept: Goal: Level of anxiety will decrease Outcome: Not Progressing   Problem: Activity: Goal: Interest or engagement in leisure activities will improve Outcome: Not Progressing

## 2018-11-02 NOTE — [facility] Group Notes (Signed)
[facility] Group Notes:  (Nursing/MHT/Case Management/Adjunct)  Date:  11/02/2018  Time:  3:36 PM  Type of Therapy:  Psychoeducational Skills  Participation Level:  Active  Participation Quality:  Intrusive, Monopolizing and Sharing  Affect:  Appropriate  Cognitive:  Alert  Insight:  Appropriate and Limited  Engagement in Group:  Distracting, Engaged and Monopolizing  Modes of Intervention:  Discussion, Education and Support  Summary of Progress/Problems: Pt. got up from group 3 times and when in the group, pt monopolized conversation.   Lynelle Sm[facility] 11/02/2018, 3:36 PM

## 2018-11-02 NOTE — Tx Team (Addendum)
Interdisciplinary Treatment and Diagnostic Plan Update  11/02/2018 Time of Session: 2:30PM Kelly Carr MRN: [id number]  Principal Diagnosis: Bipolar disorder with severe mania (HCC)  Secondary Diagnoses: Principal Problem:   Bipolar disorder with severe mania (HCC) Active Problems:   GERD (gastroesophageal reflux disease)   Noncompliance   Cannabis abuse   Current Medications:  Current Facility-Administered Medications  Medication Dose Route Frequency Provider Last Rate Last Dose  . acetaminophen (TYLENOL) tablet 650 mg  650 mg Oral Q6H PRN Mariel Craft, MD   650 mg at 11/02/18 0813  . alum & mag hydroxide-simeth (MAALOX/MYLANTA) 200-200-20 MG/5ML suspension 30 mL  30 mL Oral Q4H PRN Mariel Craft, MD      . ARIPiprazole (ABILIFY) tablet 20 mg  20 mg Oral Daily Mariel Craft, MD   20 mg at 11/02/18 8889  . ARIPiprazole ER (ABILIFY MAINTENA) injection 400 mg  400 mg Intramuscular Q28 days [name], [name], MD   400 mg at 11/01/18 1300  . hydrOXYzine (ATARAX/VISTARIL) tablet 50 mg  50 mg Oral Q6H PRN Mariel Craft, MD   50 mg at 11/02/18 1231  . lithium carbonate (LITHOBID) CR tablet 600 mg  600 mg Oral QHS [name], [name], MD   600 mg at 11/01/18 2134  . OLANZapine zydis (ZYPREXA) disintegrating tablet 10 mg  10 mg Oral Q8H PRN Mariel Craft, MD       And  . LORazepam (ATIVAN) tablet 1 mg  1 mg Oral PRN Mariel Craft, MD       And  . ziprasidone (GEODON) injection 20 mg  20 mg Intramuscular PRN Mariel Craft, MD      . magnesium hydroxide (MILK OF MAGNESIA) suspension 30 mL  30 mL Oral Daily PRN Mariel Craft, MD   30 mL at 11/02/18 1125  . meloxicam (MOBIC) tablet 15 mg  15 mg Oral Daily [name], [name], MD      . nicotine (NICODERM CQ - dosed in mg/24 hours) patch 21 mg  21 mg Transdermal Daily [name], [name], MD   21 mg at 11/02/18 [phone]  . pantoprazole (PROTONIX) EC tablet 40 mg  40 mg Oral Daily Mariel Craft, MD   40 mg at 11/02/18 5038  .  sertraline (ZOLOFT) tablet 50 mg  50 mg Oral Daily Mariel Craft, MD   50 mg at 11/02/18 8828  . traZODone (DESYREL) tablet 100 mg  100 mg Oral QHS Mariel Craft, MD   100 mg at 11/01/18 2134   PTA Medications: Medications Prior to Admission  Medication Sig Dispense Refill Last Dose  . ARIPiprazole (ABILIFY) 20 MG tablet Take 1 tablet (20 mg total) by mouth daily. 30 tablet 1 Past Week at Unknown time  . hydrOXYzine (ATARAX/VISTARIL) 50 MG tablet Take 1 tablet (50 mg total) by mouth every 6 (six) hours as needed for itching or anxiety. 60 tablet 1 Past Week at Unknown time  . oxcarbazepine (TRILEPTAL) 600 MG tablet Take 1 tablet (600 mg total) by mouth 2 (two) times daily. 60 tablet 1 Past Week at Unknown time  . pantoprazole (PROTONIX) 40 MG tablet Take 1 tablet (40 mg total) by mouth daily. 30 tablet 1 Past Week at Unknown time  . sertraline (ZOLOFT) 50 MG tablet Take 1 tablet (50 mg total) by mouth daily. 30 tablet 1 Past Week at Unknown time  . traZODone (DESYREL) 100 MG tablet Take 1 tablet (100 mg total) by mouth at bedtime. 30  tablet 1 Past Week at Unknown time  . [START ON 11/06/2018] ARIPiprazole ER (ABILIFY MAINTENA) 400 MG SRER injection Inject 2 mLs (400 mg total) into the muscle every 28 (twenty-eight) days. (Patient not taking: Reported on 10/30/2018) 1 each 1 Not Taking at Unknown time    Patient Stressors: Medication change or noncompliance Other: Covid 19 worries   Patient Strengths: Physical Health Work skills  Treatment Modalities: Medication Management, Group therapy, Case management,  1 to 1 session with clinician, Psychoeducation, Recreational therapy.   Physician Treatment Plan for Primary Diagnosis: Bipolar disorder with severe mania (HCC) Long Term Goal(s): Improvement in symptoms so as ready for discharge Improvement in symptoms so as ready for discharge   Short Term Goals: Ability to identify changes in lifestyle to reduce recurrence of condition will  improve Ability to verbalize feelings will improve Ability to demonstrate self-control will improve Compliance with prescribed medications will improve Ability to identify triggers associated with substance abuse/mental health issues will improve  Medication Management: Evaluate patient's response, side effects, and tolerance of medication regimen.  Therapeutic Interventions: 1 to 1 sessions, Unit Group sessions and Medication administration.  Evaluation of Outcomes: Progressing  Physician Treatment Plan for Secondary Diagnosis: Principal Problem:   Bipolar disorder with severe mania (HCC) Active Problems:   GERD (gastroesophageal reflux disease)   Noncompliance   Cannabis abuse  Long Term Goal(s): Improvement in symptoms so as ready for discharge Improvement in symptoms so as ready for discharge   Short Term Goals: Ability to identify changes in lifestyle to reduce recurrence of condition will improve Ability to verbalize feelings will improve Ability to demonstrate self-control will improve Compliance with prescribed medications will improve Ability to identify triggers associated with substance abuse/mental health issues will improve     Medication Management: Evaluate patient's response, side effects, and tolerance of medication regimen.  Therapeutic Interventions: 1 to 1 sessions, Unit Group sessions and Medication administration.  Evaluation of Outcomes: Progressing   RN Treatment Plan for Primary Diagnosis: Bipolar disorder with severe mania (HCC) Long Term Goal(s): Knowledge of disease and therapeutic regimen to maintain health will improve  Short Term Goals: Ability to verbalize frustration and anger appropriately will improve, Ability to demonstrate self-control, Ability to participate in decision making will improve, Ability to verbalize feelings will improve, Ability to disclose and discuss suicidal ideas and Ability to identify and develop effective coping behaviors  will improve  Medication Management: RN will administer medications as ordered by provider, will assess and evaluate patient's response and provide education to patient for prescribed medication. RN will report any adverse and/or side effects to prescribing provider.  Therapeutic Interventions: 1 on 1 counseling sessions, Psychoeducation, Medication administration, Evaluate responses to treatment, Monitor vital signs and CBGs as ordered, Perform/monitor CIWA, COWS, AIMS and Fall Risk screenings as ordered, Perform wound care treatments as ordered.  Evaluation of Outcomes: Progressing   LCSW Treatment Plan for Primary Diagnosis: Bipolar disorder with severe mania (HCC) Long Term Goal(s): Safe transition to appropriate next level of care at discharge, Engage patient in therapeutic group addressing interpersonal concerns.  Short Term Goals: Engage patient in aftercare planning with referrals and resources, Increase social support, Increase ability to appropriately verbalize feelings, Increase emotional regulation and Facilitate acceptance of mental health diagnosis and concerns  Therapeutic Interventions: Assess for all discharge needs, 1 to 1 time with Social worker, Explore available resources and support systems, Assess for adequacy in community support network, Educate family and significant other(s) on suicide prevention, Complete Psychosocial Assessment, Interpersonal  group therapy.  Evaluation of Outcomes: Progressing   Progress in Treatment: Attending groups: Yes. Participating in groups: Yes. Taking medication as prescribed: Yes. Toleration medication: Yes. Family/Significant other contact made: No, will contact:  CSW has made SPE attempts, however, has not been able to speak with someone. Patient understands diagnosis: Yes. Discussing patient identified problems/goals with staff: Yes. Medical problems stabilized or resolved: Yes. Denies suicidal/homicidal ideation:  Yes. Issues/concerns per patient self-inventory: No. Other: none  New problem(s) identified: No, Describe:  none  New Short Term/Long Term Goal(s): medication management for mood stabilization; elimination of SI thoughts; development of comprehensive mental wellness plan.  Patient Goals:  "get my ducks in a row"  Discharge Plan or Barriers: Patient reports that she plans on returning to her home.  Patient also reports that she has a one bedroom apartment that will be available at the end of May 2020.  Patient reports that [facility] and was scheduled for therapy on 11/09/2018.  Reason for Continuation of Hospitalization: Anxiety Depression Mania Medication stabilization  Estimated Length of Stay: 1-5 days  Recreational Therapy: Patient Stressors: N/A Patient Goal: Patient will successfully identify 2 ways of making healthy decisions post d/c within 5 recreation therapy group sessions  Attendees: Patient: Kelly Carr 11/02/2018 2:53 PM  Physician: Dr. Toni Amendlapacs, MD 11/02/2018 2:53 PM  Nursing:  11/02/2018 2:53 PM  RN Care Manager: 11/02/2018 2:53 PM  Social Worker: Penni HomansMichaela Stanfield, LCSW 11/02/2018 2:53 PM  Recreational Therapist: Hilbert BibleShay Exie Chrismer, CTRS, LRT 11/02/2018 2:53 PM  Other: Lowella Dandyarren Livingston, LCSW 11/02/2018 2:53 PM  Other: Iris Pertlivia Moton, LCSW  11/02/2018 2:53 PM  Other: 11/02/2018 2:53 PM    Scribe for Treatment Team: Harden MoMichaela J Stanfield, LCSW 11/02/2018 2:53 PM

## 2018-11-02 NOTE — [facility] Group Notes (Signed)
[facility] Group Notes:  (Nursing/MHT/Case Management/Adjunct)  Date:  11/02/2018  Time:  5:55 AM  Type of Therapy:  Group Therapy  Participation Level:  Active  Participation Quality:  Sharing  Affect:  Anxious and York Spaniel 1st took there time about giving her an ice pack.  Cognitive:  Alert  Insight:  Good  Engagement in Group:  Engaged  Modes of Intervention:  Support  Summary of Progress/Problems:  Kelly Carr 11/02/2018, 5:55 AM

## 2018-11-02 NOTE — Progress Notes (Signed)
Recreation Therapy Notes  INPATIENT RECREATION THERAPY ASSESSMENT  Patient Details Name: Kelly Carr MRN: [id number] DOB: [birth year] Today's Date: 11/02/2018       Information Obtained From: Patient  Able to Participate in Assessment/Interview: Yes  Patient Presentation: Responsive  Reason for Admission (Per Patient): Active Symptoms  Patient Stressors:    Coping Skills:   Music, Doctor, hospital, Read, Dance  Leisure Interests (2+):  Sports - Dance, Music - Listen(Cook)  Frequency of Recreation/Participation: Weekly  Awareness of Community Resources:     [employer]:     Current Use:    If no, Barriers?:    Expressed Interest in [employer] Information:    [place] of Residence:  [place]  Patient Main Form of Transportation: Set designer  Patient Strengths:  Caring  Patient Identified Areas of Improvement:  Take time for myself  Patient Goal for Hospitalization:  Focus on me  Current SI (including self-harm):  No  Current HI:  No  Current AVH: No  Staff Intervention Plan: Group Attendance, Collaborate with Interdisciplinary Treatment Team  Consent to Intern Participation: N/A  Kelly Carr 11/02/2018, 2:28 PM

## 2018-11-02 NOTE — Progress Notes (Signed)
[facility]  11/02/2018 4:30 PM Kelly Carr  MRN:  [id number] Subjective: Follow-up for this patient with bipolar disorder.  Patient seen and also attended treatment team.  Patient continues to show symptoms of mania.  She is hyperverbal agitated grandiose very disorganized.  Flight of ideas.  Very somatically focused also.  Complains constantly of the supposedly agonizing pain in her knee.  She had a knee x-ray just a couple days ago which was completely normal.  She walks around the unit with no sign of a limp and there is no swelling to be observed.  Although she is on lithium now and I am trying to minimize nonsteroidals we will give her a modest dose of Mobic every day in addition to the acetaminophen.  Patient would not take tramadol saying that she was allergic to it.  Still occasionally gets into spats with other patients but has not been physically violent. Principal Problem: Bipolar disorder with severe mania (HCC) Diagnosis: Principal Problem:   Bipolar disorder with severe mania (HCC) Active Problems:   GERD (gastroesophageal reflux disease)   Noncompliance   Cannabis abuse  Total Time spent with patient: 30 minutes  Past Psychiatric History: History of bipolar disorder with multiple hospitalizations recently and a failure to maintain any stability outside the hospital  Past Medical History:  Past Medical History:  Diagnosis Date  . Abnormal Pap smear    Unknown results>colpo>normal  . Anxiety   . Arthritis   . Asthma   . Bipolar 1 disorder (HCC)   . Depression     Past Surgical History:  Procedure Laterality Date  . NO PAST SURGERIES     Family History:  Family History  Problem Relation Age of Onset  . Diabetes Mother   . Hypertension Mother   . Heart disease Mother [id number]  . Schizophrenia Mother   . Diabetes Maternal Grandmother   . Heart disease Maternal Grandmother   . Diabetes Maternal Grandfather   . Heart disease Maternal Grandfather   . Bipolar  disorder Cousin   . Bipolar disorder Nephew   . Depression Daughter    Family Psychiatric  History: See previous Social History:  Social History   Substance and Sexual Activity  Alcohol Use Yes   Comment: "a lot"     Social History   Substance and Sexual Activity  Drug Use Not Currently    Social History   Socioeconomic History  . Marital status: Single    Spouse name: Not on file  . Number of children: Not on file  . Years of education: Not on file  . Highest education level: Not on file  Occupational History  . Not on file  Social Needs  . Financial resource strain: Not on file  . Food insecurity:    Worry: Not on file    Inability: Not on file  . Transportation needs:    Medical: Not on file    Non-medical: Not on file  Tobacco Use  . Smoking status: Current Every Day Smoker    Packs/day: 1.50    Types: Cigarettes, E-cigarettes  . Smokeless tobacco: Never Used  Substance and Sexual Activity  . Alcohol use: Yes    Comment: "a lot"  . Drug use: Not Currently  . Sexual activity: Yes    Partners: Male    Birth control/protection: None  Lifestyle  . Physical activity:    Days per week: Not on file    Minutes per session: Not on file  .  Stress: Not on file  Relationships  . Social connections:    Talks on phone: Not on file    Gets together: Not on file    Attends religious service: Not on file    Active member of club or organization: Not on file    Attends meetings of clubs or organizations: Not on file    Relationship status: Not on file  Other Topics Concern  . Not on file  Social History Narrative  . Not on file   Additional Social History:                         Sleep: Fair  Appetite:  Fair  Current Medications: Current Facility-Administered Medications  Medication Dose Route Frequency Provider Last Rate Last Dose  . acetaminophen (TYLENOL) tablet 650 mg  650 mg Oral Q6H PRN Mariel Craft, MD   650 mg at 11/02/18 0813  .  alum & mag hydroxide-simeth (MAALOX/MYLANTA) 200-200-20 MG/5ML suspension 30 mL  30 mL Oral Q4H PRN Mariel Craft, MD      . ARIPiprazole (ABILIFY) tablet 20 mg  20 mg Oral Daily Mariel Craft, MD   20 mg at 11/02/18 0802  . ARIPiprazole ER (ABILIFY MAINTENA) injection 400 mg  400 mg Intramuscular Q28 days Marijose Curington, [name], MD   400 mg at 11/01/18 1300  . hydrOXYzine (ATARAX/VISTARIL) tablet 50 mg  50 mg Oral Q6H PRN Mariel Craft, MD   50 mg at 11/02/18 1231  . lithium carbonate (LITHOBID) CR tablet 600 mg  600 mg Oral QHS Irl Bodie T, MD   600 mg at 11/01/18 2134  . OLANZapine zydis (ZYPREXA) disintegrating tablet 10 mg  10 mg Oral Q8H PRN Mariel Craft, MD       And  . LORazepam (ATIVAN) tablet 1 mg  1 mg Oral PRN Mariel Craft, MD       And  . ziprasidone (GEODON) injection 20 mg  20 mg Intramuscular PRN Mariel Craft, MD      . magnesium hydroxide (MILK OF MAGNESIA) suspension 30 mL  30 mL Oral Daily PRN Mariel Craft, MD   30 mL at 11/02/18 1125  . meloxicam (MOBIC) tablet 15 mg  15 mg Oral Daily Alayha Babineaux T, MD   15 mg at 11/02/18 1507  . nicotine (NICODERM CQ - dosed in mg/24 hours) patch 21 mg  21 mg Transdermal Daily Beverley Allender, [name], MD   21 mg at 11/02/18 2336  . pantoprazole (PROTONIX) EC tablet 40 mg  40 mg Oral Daily Mariel Craft, MD   40 mg at 11/02/18 1224  . sertraline (ZOLOFT) tablet 50 mg  50 mg Oral Daily Mariel Craft, MD   50 mg at 11/02/18 4975  . traZODone (DESYREL) tablet 100 mg  100 mg Oral QHS Mariel Craft, MD   100 mg at 11/01/18 2134    Lab Results: No results found for this or any previous visit (from the past 48 hour(s)).  Blood Alcohol level:  Lab Results  Component Value Date   ETH <10 10/30/2018   ETH <10 10/15/2018    Metabolic Disorder Labs: Lab Results  Component Value Date   HGBA1C 5.0 10/09/2018   MPG 96.8 10/09/2018   MPG 96.8 03/21/2018   Lab Results  Component Value Date   PROLACTIN 39.5 (H)  04/12/2018   PROLACTIN 2.0 12/05/2011   Lab Results  Component Value Date  CHOL 215 (H) 10/09/2018   TRIG 83 10/09/2018   HDL 85 10/09/2018   CHOLHDL 2.5 10/09/2018   VLDL 17 10/09/2018   LDLCALC 113 (H) 10/09/2018   LDLCALC 118 (H) 03/21/2018    Physical Findings: AIMS:  , ,  ,  ,    CIWA:    COWS:     Musculoskeletal: Strength & Muscle Tone: within normal limits Gait & Station: normal Patient leans: N/A  Psychiatric Specialty Exam: Physical Exam  Nursing note and vitals reviewed. Constitutional: She appears well-developed and well-nourished.  HENT:  Head: Normocephalic and atraumatic.  Eyes: Pupils are equal, round, and reactive to light. Conjunctivae are normal.  Neck: Normal range of motion.  Cardiovascular: Regular rhythm and normal heart sounds.  Respiratory: Effort normal. No respiratory distress.  GI: Soft.  Musculoskeletal: Normal range of motion.  Neurological: She is alert.  Skin: Skin is warm and dry.  Psychiatric: Her affect is labile. Her speech is rapid and/or pressured and tangential. She is agitated. She is not aggressive. Thought content is delusional. Cognition and memory are impaired. She expresses impulsivity.    Review of Systems  Constitutional: Negative.   HENT: Negative.   Eyes: Negative.   Respiratory: Negative.   Cardiovascular: Negative.   Gastrointestinal: Negative.   Musculoskeletal: Positive for joint pain.  Skin: Negative.   Neurological: Negative.   Psychiatric/Behavioral: Negative for depression, hallucinations, memory loss, substance abuse and suicidal ideas. The patient is nervous/anxious and has insomnia.     Blood pressure 137/71, pulse 84, temperature 98.3 F (36.8 C), temperature source Oral, resp. rate 18, height  (1.651 m), weight 72.6 kg, last menstrual period 10/30/2018, SpO2 100 %.Body mass index is 26.63 kg/m.  General Appearance: Casual  Eye Contact:  Minimal  Speech:  Slow  Volume:  Decreased  Mood:   Euphoric and Irritable  Affect:  Congruent  Thought Process:  Disorganized  Orientation:  Full (Time, Place, and Person)  Thought Content:  Illogical and Tangential  Suicidal Thoughts:  No  Homicidal Thoughts:  No  Memory:  Immediate;   Fair Recent;   Poor Remote;   Fair  Judgement:  Impaired  Insight:  Shallow  Psychomotor Activity:  Increased  Concentration:  Concentration: Poor  Recall:  [employer] of Knowledge:  Fair  Language:  Fair  Akathisia:  No  Handed:  Right  AIMS (if indicated):     Assets:  Desire for Improvement Physical Health Resilience  ADL's:  Impaired  Cognition:  Impaired,  Mild  Sleep:  Number of Hours: 6     Treatment Plan Summary: Daily contact with patient to assess and evaluate symptoms and progress in treatment, Medication management and Plan Continues to be manic.  Multiple hospitalizations now for dangerous behavior outside the hospital.  We are just now starting to get her on to lithium.  Blood level Monday.  Psychoeducation and supportive therapy with the patient.  Adding some Mobic for her pain.  Encouraged her to continue her group attendance.  Remind her to get a good night sleep.  Mordecai Rasmussen, MD 11/02/2018, 4:30 PM

## 2018-11-02 NOTE — Progress Notes (Signed)
D - Patient was in the day room upon arrival. Patient was irritable stating her anxiety was high because, "The doctors don't ever fucking listen to me in here. I am going home tomorrow!" Patient then got into a verbal argument with another patient. Patient was de-escalated and apologized for her actions. The patients were separated for the rest of the evening. Patient rated anxiety, depression and pain 10/10. Patient denies SI/HI/AVH.   A - Patient compliant with medication administration per MD orders. Patient given education. Patient given support and encouragement to be active in her treatment plan. Patient informed to let staff know if there are any issues or problems on the unit.   R - Patient being monitored Q 15 minutes for safety per unit protocol. Patient remains safe on the unit.

## 2018-11-02 NOTE — [facility] Group Notes (Signed)
LCSW Group Therapy Note  11/02/2018 12:33 PM  Type of Therapy and Topic:  Group Therapy:  Feelings around Relapse and Recovery  Participation Level:  Active   Description of Group:    Patients in this group will discuss emotions they experience before and after a relapse. They will process how experiencing these feelings, or avoidance of experiencing them, relates to having a relapse. Facilitator will guide patients to explore emotions they have related to recovery. Patients will be encouraged to process which emotions are more powerful. They will be guided to discuss the emotional reaction significant others in their lives may have to their relapse or recovery. Patients will be assisted in exploring ways to respond to the emotions of others without this contributing to a relapse.  Therapeutic Goals: 1. Patient will identify two or more emotions that lead to a relapse for them 2. Patient will identify two emotions that result when they relapse 3. Patient will identify two emotions related to recovery 4. Patient will demonstrate ability to communicate their needs through discussion and/or role plays   Summary of Patient Progress: Pt was appropriate and respectful in group. Pt was able to identify what a relapse means to her and what behaviors she is trying not to relapse into again. Pt discussed things that contribute to her relapse are her anger and self-worth and things that assist her with staying in a sobriety phase are positive supports and having someone to comfort her. Pt identified her therapist and prayer as coping strategies for her.    Therapeutic Modalities:   Cognitive Behavioral Therapy Solution-Focused Therapy Assertiveness Training Relapse Prevention Therapy   Iris Pert, MSW, LCSW Clinical Social Work 11/02/2018 12:33 PM

## 2018-11-03 NOTE — Plan of Care (Signed)
Patient was complaining about how high her anxiety was tonight. (See MAR)   Problem: Self-Concept: Goal: Level of anxiety will decrease Outcome: Not Progressing

## 2018-11-03 NOTE — Progress Notes (Signed)
[name] MD Progress Note  11/03/2018 1:40 PM Kelly Carr  MRN:  [id number] Subjective: Follow-up for this patient with bipolar disorder.  Patient continues to show pressured speech emotional lability disorganized thinking and grandiosity.  She is taking care of her ADLs appropriately however.  Neatly groomed.  For the most part she is behaving her self on the ward without a lot of agitation and no current violence.  Insight is still very limited. Principal Problem: Bipolar disorder with severe mania (HCC) Diagnosis: Principal Problem:   Bipolar disorder with severe mania (HCC) Active Problems:   GERD (gastroesophageal reflux disease)   Noncompliance   Cannabis abuse  Total Time spent with patient: 30 minutes  Past Psychiatric History: Patient has a history of bipolar disorder with multiple hospitalizations for mania just this year  Past Medical History:  Past Medical History:  Diagnosis Date  . Abnormal Pap smear    Unknown results>colpo>normal  . Anxiety   . Arthritis   . Asthma   . Bipolar 1 disorder (HCC)   . Depression     Past Surgical History:  Procedure Laterality Date  . NO PAST SURGERIES     Family History:  Family History  Problem Relation Age of Onset  . Diabetes Mother   . Hypertension Mother   . Heart disease Mother 77  . Schizophrenia Mother   . Diabetes Maternal Grandmother   . Heart disease Maternal Grandmother   . Diabetes Maternal Grandfather   . Heart disease Maternal Grandfather   . Bipolar disorder Cousin   . Bipolar disorder Nephew   . Depression Daughter    Family Psychiatric  History: See previous Social History:  Social History   Substance and Sexual Activity  Alcohol Use Yes   Comment: "a lot"     Social History   Substance and Sexual Activity  Drug Use Not Currently    Social History   Socioeconomic History  . Marital status: Single    Spouse name: Not on file  . Number of children: Not on file  . Years of education: Not on  file  . Highest education level: Not on file  Occupational History  . Not on file  Social Needs  . Financial resource strain: Not on file  . Food insecurity:    Worry: Not on file    Inability: Not on file  . Transportation needs:    Medical: Not on file    Non-medical: Not on file  Tobacco Use  . Smoking status: Current Every Day Smoker    Packs/day: 1.50    Types: Cigarettes, E-cigarettes  . Smokeless tobacco: Never Used  Substance and Sexual Activity  . Alcohol use: Yes    Comment: "a lot"  . Drug use: Not Currently  . Sexual activity: Yes    Partners: Male    Birth control/protection: None  Lifestyle  . Physical activity:    Days per week: Not on file    Minutes per session: Not on file  . Stress: Not on file  Relationships  . Social connections:    Talks on phone: Not on file    Gets together: Not on file    Attends religious service: Not on file    Active member of club or organization: Not on file    Attends meetings of clubs or organizations: Not on file    Relationship status: Not on file  Other Topics Concern  . Not on file  Social History Narrative  . Not on file  Additional Social History:                         Sleep: Fair  Appetite:  Fair  Current Medications: Current Facility-Administered Medications  Medication Dose Route Frequency Provider Last Rate Last Dose  . acetaminophen (TYLENOL) tablet 650 mg  650 mg Oral Q6H PRN [name], Sheila M, MD   650 mg at 11/03/18 0805  . alum & mag hydroxide-simeth (MAALOX/MYLANTA) 200-200-20 MG/5ML suspension 30 mL  30 mL Oral Q4H PRN [name], Sheila M, MD      . ARIPiprazole (ABILIFY) tablet 20 mg  20 mg Oral Daily [name], Sheila M, MD   20 mg at 11/03/18 [id number]  . ARIPiprazole ER (ABILIFY MAINTENA) injection 400 mg  400 mg Intramuscular Q28 days Severo Beber, [name], MD   400 mg at 11/01/18 1300  . hydrOXYzine (ATARAX/VISTARIL) tablet 50 mg  50 mg Oral Q6H PRN [name], Sheila M, MD   50 mg at 11/03/18 1246   . lithium carbonate (LITHOBID) CR tablet 600 mg  600 mg Oral QHS Yulissa Needham T, MD   600 mg at 11/02/18 2155  . magnesium hydroxide (MILK OF MAGNESIA) suspension 30 mL  30 mL Oral Daily PRN [name], Sheila M, MD   30 mL at 11/02/18 1125  . meloxicam (MOBIC) tablet 15 mg  15 mg Oral Daily Jalyric Kaestner, [name], MD   15 mg at 11/03/18 0804  . nicotine (NICODERM CQ - dosed in mg/24 hours) patch 21 mg  21 mg Transdermal Daily Willetta York, [name], MD   21 mg at 11/03/18 0806  . OLANZapine zydis (ZYPREXA) disintegrating tablet 10 mg  10 mg Oral Q8H PRN [name], Sheila M, MD       And  . ziprasidone (GEODON) injection 20 mg  20 mg Intramuscular PRN [name], Sheila M, MD      . pantoprazole (PROTONIX) EC tablet 40 mg  40 mg Oral Daily [name], Sheila M, MD   40 mg at 11/03/18 0805  . sertraline (ZOLOFT) tablet 50 mg  50 mg Oral Daily [name], Sheila M, MD   50 mg at 11/03/18 0804  . traZODone (DESYREL) tablet 100 mg  100 mg Oral QHS [name], Sheila M, MD   100 mg at 11/02/18 2155    Lab Results: No results found for this or any previous visit (from the past 48 hour(s)).  Blood Alcohol level:  Lab Results  Component Value Date   ETH <10 10/30/2018   ETH <10 10/15/2018    Metabolic Disorder Labs: Lab Results  Component Value Date   HGBA1C 5.0 10/09/2018   MPG 96.8 10/09/2018   MPG 96.8 03/21/2018   Lab Results  Component Value Date   PROLACTIN 39.5 (H) 04/12/2018   PROLACTIN 2.0 12/05/2011   Lab Results  Component Value Date   CHOL 215 (H) 10/09/2018   TRIG 83 10/09/2018   HDL 85 10/09/2018   CHOLHDL 2.5 10/09/2018   VLDL 17 10/09/2018   LDLCALC 113 (H) 10/09/2018   LDLCALC 118 (H) 03/21/2018    Physical Findings: AIMS:  , ,  ,  ,    CIWA:    COWS:     Musculoskeletal: Strength & Muscle Tone: within normal limits Gait & Station: normal Patient leans: N/A  Psychiatric Specialty Exam: Physical Exam  Nursing note and vitals reviewed. Constitutional: She appears well-developed and  well-nourished.  HENT:  Head: Normocephalic and atraumatic.  Eyes: Pupils are equal, round, and reactive to  light. Conjunctivae are normal.  Neck: Normal range of motion.  Cardiovascular: Regular rhythm and normal heart sounds.  Respiratory: Effort normal. No respiratory distress.  GI: Soft.  Musculoskeletal: Normal range of motion.  Neurological: She is alert.  Skin: Skin is warm and dry.  Psychiatric: Her affect is labile and inappropriate. Her speech is rapid and/or pressured and tangential. She is agitated. She is not aggressive. Thought content is not paranoid. Cognition and memory are impaired. She expresses impulsivity. She expresses no homicidal and no suicidal ideation.    Review of Systems  Constitutional: Negative.   HENT: Negative.   Eyes: Negative.   Respiratory: Negative.   Cardiovascular: Negative.   Gastrointestinal: Negative.   Musculoskeletal: Positive for joint pain.  Skin: Negative.   Neurological: Negative.   Psychiatric/Behavioral: Positive for memory loss. Negative for depression, hallucinations, substance abuse and suicidal ideas. The patient is not nervous/anxious and does not have insomnia.     Blood pressure 112/89, pulse 100, temperature 98 F (36.7 C), temperature source Oral, resp. rate 18, height  (1.651 m), weight 72.6 kg, last menstrual period 10/30/2018, SpO2 99 %.Body mass index is 26.63 kg/m.  General Appearance: Fairly Groomed  Eye Contact:  Fair  Speech:  Pressured  Volume:  Increased  Mood:  Euphoric and Irritable  Affect:  Inappropriate and Labile  Thought Process:  Disorganized  Orientation:  Full (Time, Place, and Person)  Thought Content:  Illogical, Rumination and Tangential  Suicidal Thoughts:  No  Homicidal Thoughts:  No  Memory:  Immediate;   Fair Recent;   Fair Remote;   Fair  Judgement:  Impaired  Insight:  Shallow  Psychomotor Activity:  Restlessness  Concentration:  Concentration: Fair  Recall:  [employer] of  Knowledge:  Fair  Language:  Fair  Akathisia:  No  Handed:  Right  AIMS (if indicated):     Assets:  Desire for Improvement  ADL's:  Intact  Cognition:  Impaired,  Mild  Sleep:  Number of Hours: 5     Treatment Plan Summary: Daily contact with patient to assess and evaluate symptoms and progress in treatment, Medication management and Plan Patient is continuing to show a little bit of improvement but still at least hypomanic with the potential to get much worse after discharge.  Supportive counseling and therapy and review of treatment plan.  No change to medicine for today.  Encouraged her with her attempts to stay calm and try to think logically about her future plan.  Mordecai Rasmussen, MD 11/03/2018, 1:40 PM

## 2018-11-03 NOTE — Progress Notes (Addendum)
The patient throughout the day has been very needy and attention seeking. She tries to start arguments with the staff and other patients. "no one makes my bed, no one cleans my room or takes my trash out, no one helps me" is just one statement I over heard her say. Another patient said that she call her a "bitch" in the hall. Over and over all day she has gad a negative attitude and very hateful.  She also complains of knee pain "10"  but went outside running around on the basketball court and sitting on the sidewalk.    Torrie Mayers RN

## 2018-11-03 NOTE — Plan of Care (Signed)
Pt denies D, A, SI, HI and AVH. Pt stated she slept "really good". Pt has a goal "to take it easy". Pt was educated on care plan and verbalizes understanding. Torrie Mayers RN Problem: Education: Goal: Ability to state activities that reduce stress will improve Outcome: Progressing   Problem: Self-Concept: Goal: Ability to identify factors that promote anxiety will improve Outcome: Progressing Goal: Level of anxiety will decrease Outcome: Progressing Goal: Ability to modify response to factors that promote anxiety will improve Outcome: Progressing   Problem: Activity: Goal: Interest or engagement in leisure activities will improve Outcome: Progressing Goal: Imbalance in normal sleep/wake cycle will improve Outcome: Progressing   Problem: Coping: Goal: Coping ability will improve Outcome: Progressing Goal: Will verbalize feelings Outcome: Progressing   Problem: Coping: Goal: Coping ability will improve Outcome: Progressing Goal: Will verbalize feelings Outcome: Progressing   Problem: Health Behavior/Discharge Planning: Goal: Ability to make decisions will improve Outcome: Progressing Goal: Compliance with therapeutic regimen will improve Outcome: Progressing

## 2018-11-03 NOTE — [facility] Group Notes (Signed)
LCSW Group Therapy Note  11/03/2018 1:15pm  Type of Therapy and Topic:  Group Therapy:  Cognitive Distortions  Participation Level:  Active   Description of Group:    Patients in this group will be introduced to the topic of cognitive distortions.  Patients will identify and describe cognitive distortions, describe the feelings these distortions create for them.  Patients will identify one or more situations in their personal life where they have cognitively distorted thinking and will verbalize challenging this cognitive distortion through positive thinking skills.  Patients will practice the skill of using positive affirmations to challenge cognitive distortions using affirmation cards.    Therapeutic Goals:  1. Patient will identify two or more cognitive distortions they have used 2. Patient will identify one or more emotions that stem from use of a cognitive distortion 3. Patient will demonstrate use of a positive affirmation to counter a cognitive distortion through discussion and/or role play. 4. Patient will describe one way cognitive distortions can be detrimental to wellness   Summary of Patient Progress: The patient reported that she feels "frustrated". Patients were introduced to the topic of cognitive distortions. The patient was able to identify and describe cognitive distortions and the feelings these distortions create for her. Patient identified a situation in her personal life where she has cognitively distorted thinking and verbalized and challenged this cognitive distortion through positive thinking skills. Patient was able to provide support and validation to other group members.     Therapeutic Modalities:   Cognitive Behavioral Therapy Motivational Interviewing   Finnegan Gatta  [name], LCSW 11/03/2018 12:39 PM

## 2018-11-03 NOTE — Progress Notes (Signed)
D - Patient was in the day room upon arrival. Patient was irritable stating her anxiety was high because, "Two of the patients in here are bothering another patient and picking on her for no reason." Situation was assessed and the patients were de-escalated. Patient continued to say her anxiety was high. (See MAR) Patient denies SI/HI/AVH. Patient rated her pain 8/10. See MAR.   A - Patient compliant with medication administration per MD orders. Patient given education. Patient given support and encouragement to be active in her treatment plan. Patient informed to let staff know if there are any issues or problems on the unit.   R - Patient being monitored Q 15 minutes for safety per unit protocol. Patient remains safe on the unit.

## 2018-11-04 MED ORDER — ARIPIPRAZOLE 10 MG PO TABS
30.0000 mg | ORAL_TABLET | Freq: Every day | ORAL | Status: DC
  Administered 2018-11-05 – 2018-11-14 (×10): 30 mg via ORAL
  Filled 2018-11-04 (×10): qty 3

## 2018-11-04 MED ORDER — HYDROCERIN EX CREA
TOPICAL_CREAM | Freq: Two times a day (BID) | CUTANEOUS | Status: DC
  Administered 2018-11-04 – 2018-11-14 (×10): via TOPICAL
  Filled 2018-11-04 (×2): qty 113

## 2018-11-04 NOTE — Progress Notes (Signed)
[facility] MD Progress Note  11/04/2018 1:04 PM Kelly Carr  MRN:  [id number] Subjective: Follow-up for this patient with bipolar disorder with mania.  Patient is hyperactive hyperverbal and intrusive much of the time.  Usually not obviously psychotic but when she sat down with me she goes off on tangents of grandiosity about various entrepreneurial and legal plan she has for the future which are all very unrealistic.  It turns out that patient was under the impression that we were discharging her tomorrow which was never the plan.  When I advised her that I was not planning to discharge her tomorrow she rapidly escalated and became very agitated.  Jumped to her feet shouting screaming threatening me with various lawsuits and that sort of thing.  Did not become physically violent.  Only physical complaint today is cracking skin on her feet.  No complaints of any medicine side effects Principal Problem: Bipolar disorder with severe mania (HCC) Diagnosis: Principal Problem:   Bipolar disorder with severe mania (HCC) Active Problems:   GERD (gastroesophageal reflux disease)   Noncompliance   Cannabis abuse  Total Time spent with patient: 30 minutes  Past Psychiatric History: Past history of bipolar disorder with recent extended manic episode  Past Medical History:  Past Medical History:  Diagnosis Date  . Abnormal Pap smear    Unknown results>colpo>normal  . Anxiety   . Arthritis   . Asthma   . Bipolar 1 disorder (HCC)   . Depression     Past Surgical History:  Procedure Laterality Date  . NO PAST SURGERIES     Family History:  Family History  Problem Relation Age of Onset  . Diabetes Mother   . Hypertension Mother   . Heart disease Mother 68  . Schizophrenia Mother   . Diabetes Maternal Grandmother   . Heart disease Maternal Grandmother   . Diabetes Maternal Grandfather   . Heart disease Maternal Grandfather   . Bipolar disorder Cousin   . Bipolar disorder Nephew   . Depression  Daughter    Family Psychiatric  History: See previous Social History:  Social History   Substance and Sexual Activity  Alcohol Use Yes   Comment: "a lot"     Social History   Substance and Sexual Activity  Drug Use Not Currently    Social History   Socioeconomic History  . Marital status: Single    Spouse name: Not on file  . Number of children: Not on file  . Years of education: Not on file  . Highest education level: Not on file  Occupational History  . Not on file  Social Needs  . Financial resource strain: Not on file  . Food insecurity:    Worry: Not on file    Inability: Not on file  . Transportation needs:    Medical: Not on file    Non-medical: Not on file  Tobacco Use  . Smoking status: Current Every Day Smoker    Packs/day: 1.50    Types: Cigarettes, E-cigarettes  . Smokeless tobacco: Never Used  Substance and Sexual Activity  . Alcohol use: Yes    Comment: "a lot"  . Drug use: Not Currently  . Sexual activity: Yes    Partners: Male    Birth control/protection: None  Lifestyle  . Physical activity:    Days per week: Not on file    Minutes per session: Not on file  . Stress: Not on file  Relationships  . Social connections:  Talks on phone: Not on file    Gets together: Not on file    Attends religious service: Not on file    Active member of club or organization: Not on file    Attends meetings of clubs or organizations: Not on file    Relationship status: Not on file  Other Topics Concern  . Not on file  Social History Narrative  . Not on file   Additional Social History:                         Sleep: Fair  Appetite:  Fair  Current Medications: Current Facility-Administered Medications  Medication Dose Route Frequency Provider Last Rate Last Dose  . acetaminophen (TYLENOL) tablet 650 mg  650 mg Oral Q6H PRN [name], Sheila M, MD   650 mg at 11/04/18 1150  . alum & mag hydroxide-simeth (MAALOX/MYLANTA) 200-200-20 MG/5ML  suspension 30 mL  30 mL Oral Q4H PRN [name], Sheila M, MD      . Melene Muller[START ON 11/05/2018] ARIPiprazole (ABILIFY) tablet 30 mg  30 mg Oral Daily [name], [name], MD      . ARIPiprazole ER (ABILIFY MAINTENA) injection 400 mg  400 mg Intramuscular Q28 days [name], [name], MD   400 mg at 11/01/18 1300  . hydrOXYzine (ATARAX/VISTARIL) tablet 50 mg  50 mg Oral Q6H PRN [name], Sheila M, MD   50 mg at 11/04/18 [id number]  . lithium carbonate (LITHOBID) CR tablet 600 mg  600 mg Oral QHS [name], [name], MD   600 mg at 11/03/18 2229  . magnesium hydroxide (MILK OF MAGNESIA) suspension 30 mL  30 mL Oral Daily PRN [name], Sheila M, MD   30 mL at 11/02/18 1125  . meloxicam (MOBIC) tablet 15 mg  15 mg Oral Daily [name], [name], MD   15 mg at 11/04/18 0826  . nicotine (NICODERM CQ - dosed in mg/24 hours) patch 21 mg  21 mg Transdermal Daily [name], [name], MD   21 mg at 11/04/18 0826  . OLANZapine zydis (ZYPREXA) disintegrating tablet 10 mg  10 mg Oral Q8H PRN [name], Sheila M, MD   10 mg at 11/04/18 1150   And  . ziprasidone (GEODON) injection 20 mg  20 mg Intramuscular PRN [name], Sheila M, MD      . pantoprazole (PROTONIX) EC tablet 40 mg  40 mg Oral Daily [name], Sheila M, MD   40 mg at 11/04/18 [id number]  . sertraline (ZOLOFT) tablet 50 mg  50 mg Oral Daily [name], Sheila M, MD   50 mg at 11/04/18 [id number]  . traZODone (DESYREL) tablet 100 mg  100 mg Oral QHS [name], Sheila M, MD   100 mg at 11/03/18 2228    Lab Results: No results found for this or any previous visit (from the past 48 hour(s)).  Blood Alcohol level:  Lab Results  Component Value Date   ETH <10 10/30/2018   ETH <10 10/15/2018    Metabolic Disorder Labs: Lab Results  Component Value Date   HGBA1C 5.0 10/09/2018   MPG 96.8 10/09/2018   MPG 96.8 03/21/2018   Lab Results  Component Value Date   PROLACTIN 39.5 (H) 04/12/2018   PROLACTIN 2.0 12/05/2011   Lab Results  Component Value Date   CHOL 215 (H) 10/09/2018   TRIG 83 10/09/2018   HDL  85 10/09/2018   CHOLHDL 2.5 10/09/2018   VLDL 17 10/09/2018   LDLCALC 113 (H) 10/09/2018  LDLCALC 118 (H) 03/21/2018    Physical Findings: AIMS:  , ,  ,  ,    CIWA:    COWS:     Musculoskeletal: Strength & Muscle Tone: within normal limits Gait & Station: normal Patient leans: N/A  Psychiatric Specialty Exam: Physical Exam  Nursing note and vitals reviewed. Constitutional: She appears well-developed and well-nourished.  HENT:  Head: Normocephalic and atraumatic.  Eyes: Pupils are equal, round, and reactive to light. Conjunctivae are normal.  Neck: Normal range of motion.  Cardiovascular: Regular rhythm and normal heart sounds.  Respiratory: Effort normal. No respiratory distress.  GI: Soft.  Musculoskeletal: Normal range of motion.  Neurological: She is alert.  Skin: Skin is warm and dry.  Psychiatric: Her affect is labile. Her affect is not blunt. Her speech is rapid and/or pressured and tangential. She is agitated. She is not aggressive. Thought content is paranoid and delusional. Cognition and memory are impaired. She expresses impulsivity.    Review of Systems  Constitutional: Negative.   HENT: Negative.   Eyes: Negative.   Respiratory: Negative.   Cardiovascular: Negative.   Gastrointestinal: Negative.   Musculoskeletal: Negative.   Skin: Negative.   Neurological: Negative.   Psychiatric/Behavioral: Negative for depression, hallucinations, memory loss, substance abuse and suicidal ideas. The patient is nervous/anxious. The patient does not have insomnia.     Blood pressure 140/74, pulse 81, temperature 97.8 F (36.6 C), temperature source Oral, resp. rate 18, height 5\' 5"  (1.651 m), weight 72.6 kg, last menstrual period 10/30/2018, SpO2 100 %.Body mass index is 26.63 kg/m.  General Appearance: Fairly Groomed  Eye Contact:  Good  Speech:  Pressured  Volume:  Increased  Mood:  Angry, Anxious and Irritable  Affect:  Inappropriate and Labile  Thought Process:   Disorganized  Orientation:  Full (Time, Place, and Person)  Thought Content:  Illogical, Delusions and Paranoid Ideation  Suicidal Thoughts:  No  Homicidal Thoughts:  No  Memory:  Immediate;   Fair Recent;   Fair Remote;   Fair  Judgement:  Impaired  Insight:  Shallow  Psychomotor Activity:  Increased  Concentration:  Concentration: Poor  Recall:  Poor  Fund of Knowledge:  Good  Language:  Good  Akathisia:  No  Handed:  Right  AIMS (if indicated):     Assets:  Desire for Improvement Physical Health Resilience  ADL's:  Impaired  Cognition:  Impaired,  Mild  Sleep:  Number of Hours: 4     Treatment Plan Summary: Daily contact with patient to assess and evaluate symptoms and progress in treatment, Medication management and Plan She continues to be manic and disorganized and psychotic in her thinking.  Keeps it under control a little bit when she is getting her way but as soon as she is not getting her way flies off the handle and becomes inappropriate.  We will check a lithium level tomorrow.  We will reassess medications to see if we then need to increase or change anything.  Tried to do some supportive counseling and letting her know that she would not be in the hospital "forever" as she feared.  Mordecai Rasmussen, MD 11/04/2018, 1:04 PM

## 2018-11-04 NOTE — Plan of Care (Signed)
Patient continues with agitation, restlessness, severe attention-seeking behaviors. Denies SI, but continues with self denigration, anxiety, spontaneous tearfulness. Labile emotionality persistent. Unable to stay focused in any discussion thus far. Problem: Activity: Goal: Interest or engagement in leisure activities will improve Outcome: Progressing   Problem: Coping: Goal: Will verbalize feelings Outcome: Progressing   Problem: Activity: Goal: Interes Problem: Education: Goal: Ability to state activities that reduce stress will improve Outcome: Not Progressing   Problem: Self-Concept: Goal: Ability to identify factors that promote anxiety will improve Outcome: Not Progressing   Problem: Self-Concept: Goal: Ability to modify response to factors that promote anxiety will improve Outcome: Not Progressing   Problem: Activity: Goal: Imbalance in normal sleep/wake cycle will improve Outcome: Not Progressing  t or engagement in leisure activities will improve Outcome: Progressing

## 2018-11-04 NOTE — Plan of Care (Signed)
D- Patient alert and oriented. Patient presents in a pleasant, but tearful mood on assessment stating that "I had to divorce my whole family last night except for my mother, but I'm still not going to call her until after the 12th". Patient started that she slept "like a baby" last night and still has complaints of pain in her right knee and tailbone, rating her pain a "10,000", in which she received her scheduled pain medication from this Clinical research associate. Patient denies any signs/symptoms of depression/anxiety, however she requested anxiety medication with her morning medication. Patient also denies SI, HI, AVH, at this time. Patient's goal for today is "patience", in which she will "walk it out" in order to accomplish her goal.  A- Scheduled medications administered to patient, per MD orders. Support and encouragement provided.  Routine safety checks conducted every 15 minutes.  Patient informed to notify staff with problems or concerns.  R- No adverse drug reactions noted. Patient contracts for safety at this time. Patient compliant with medications and treatment plan. Patient receptive, calm, and cooperative. Patient interacts well with others on the unit.  Patient remains safe at this time.  Problem: Education: Goal: Ability to state activities that reduce stress will improve Outcome: Progressing   Problem: Self-Concept: Goal: Ability to identify factors that promote anxiety will improve Outcome: Progressing Goal: Level of anxiety will decrease Outcome: Progressing Goal: Ability to modify response to factors that promote anxiety will improve Outcome: Progressing   Problem: Activity: Goal: Interest or engagement in leisure activities will improve Outcome: Progressing Goal: Imbalance in normal sleep/wake cycle will improve Outcome: Progressing   Problem: Coping: Goal: Coping ability will improve Outcome: Progressing Goal: Will verbalize feelings Outcome: Progressing   Problem: Health  Behavior/Discharge Planning: Goal: Ability to make decisions will improve Outcome: Progressing Goal: Compliance with therapeutic regimen will improve Outcome: Progressing   Problem: Safety: Goal: Ability to disclose and discuss suicidal ideas will improve Outcome: Progressing Goal: Ability to identify and utilize support systems that promote safety will improve Outcome: Progressing   Problem: Education: Goal: Will be free of psychotic symptoms Outcome: Progressing Goal: Knowledge of the prescribed therapeutic regimen will improve Outcome: Progressing   Problem: Health Behavior/Discharge Planning: Goal: Compliance with prescribed medication regimen will improve Outcome: Progressing   Problem: Self-Care: Goal: Ability to participate in self-care as condition permits will improve Outcome: Progressing

## 2018-11-04 NOTE — Progress Notes (Signed)
D- Patient alert and oriented but severely anxious. Mood lability is dramatic. Denies SI, HI, AVH, and pain. Quotes. Goal: get settled. Unable to remain focused on any task and becomes angry when unsuccessful. A- Scheduled medications administered to patient per MD orders. Support and encouragement provided.  Routine safety checks conducted every 15 minutes.  Patient informed to notify staff with problems or concerns. R- No adverse drug reactions noted. Patient contracts for safety at this time. Patient compliant with medications and treatment plan.  Patient remains safe at this time.

## 2018-11-04 NOTE — Progress Notes (Signed)
Pastoral Care Visit   11/04/18 1920  Clinical Encounter Type  Visited With Patient  Visit Type Initial;Psychological support  Referral From Patient  Consult/Referral To Chaplain  Spiritual Encounters  Spiritual Needs Emotional;Prayer  Stress Factors  Patient Stress Factors Family relationships;Financial concerns;Loss of control   Pt requested to meet w/ chap.  Chap met w/ pt in dining room.  Pt was hyperverbal and jumped from topic to topic.  Pt shared about family, work, abuse, finances, frustration w/ discharge plans, problems with other pts, and much more.  Most of pt's thoughts were scattered and disjointed.  Pt was pleasant and appropriate and even interacted with another female pt demonstrating compassion and concern for her. Chap listened and offered prayer for pt.  Pt was grateful for the visit and listening ear.    , Chaplain 

## 2018-11-04 NOTE — [facility] Group Notes (Signed)
[facility] Group Notes:  (Nursing/MHT/Case Management/Adjunct)  Date:  11/04/2018  Time:  9:46 PM  Type of Therapy:  Group Therapy  Participation Level:  Did Not Attend  Mayra Neer 11/04/2018, 9:46 PM

## 2018-11-04 NOTE — [facility] Group Notes (Signed)
Kelly Carr Group Therapy Note 11/04/2018 1:15pm  Type of Therapy and Topic: Group Therapy: Feelings Around Returning Home & Establishing a Supportive Framework and Supporting Oneself When Supports Not Available  Participation Level: Active  Description of Group:  Patients first processed thoughts and feelings about upcoming discharge. These included fears of upcoming changes, lack of change, new living environments, judgements and expectations from others and overall stigma of mental health issues. The group then discussed the definition of a supportive framework, what that looks and feels like, and how do to discern it from an unhealthy non-supportive network. The group identified different types of supports as well as what to do when your family/friends are less than helpful or unavailable  Therapeutic Goals  1. Patient will identify one healthy supportive network that they can use at discharge. 2. Patient will identify one factor of a supportive framework and how to tell it from an unhealthy network. 3. Patient able to identify one coping skill to use when they do not have positive supports from others. 4. Patient will demonstrate ability to communicate their needs through discussion and/or role plays.  Summary of Patient Progress:  The patient reported she feels "good." Pt engaged during group session. As patients processed their anxiety about discharge and described healthy supports patient shared she is not ready to be discharge. She stated, "I need more alone time."  Patients identified at least one self-care tool they were willing to use after discharge.   Therapeutic Modalities Cognitive Behavioral Therapy Motivational Interviewing   Kelly Carr  [name], Kelly Carr 11/04/2018 10:32 AM

## 2018-11-05 LAB — LITHIUM LEVEL: Lithium Lvl: 0.48 mmol/L — ABNORMAL LOW (ref 0.60–1.20)

## 2018-11-05 MED ORDER — LITHIUM CARBONATE ER 450 MG PO TBCR
900.0000 mg | EXTENDED_RELEASE_TABLET | Freq: Every day | ORAL | Status: DC
  Administered 2018-11-05 – 2018-11-13 (×9): 900 mg via ORAL
  Filled 2018-11-05 (×9): qty 2

## 2018-11-05 NOTE — Progress Notes (Signed)
[facility] MD Progress Note  11/05/2018 4:49 PM Kelly Carr  MRN:  [id number] Subjective: Patient seen chart reviewed.  Patient continues to demonstrate manic symptoms.  Racing thoughts.  Hyperactive behavior.  Grandiose thinking.  Hyperverbal and pressured.  Denies suicidal thoughts.  Shows poor insight into her illness and remains focused on unrealistic grandiose interpretations of her problems.  Lithium level came back under 5 today so we will increase the dose of that. Principal Problem: Bipolar disorder with severe mania (HCC) Diagnosis: Principal Problem:   Bipolar disorder with severe mania (HCC) Active Problems:   GERD (gastroesophageal reflux disease)   Noncompliance   Cannabis abuse  Total Time spent with patient: 30 minutes  Past Psychiatric History: History of bipolar disorder with current episode of mania going on for some months now  Past Medical History:  Past Medical History:  Diagnosis Date  . Abnormal Pap smear    Unknown results>colpo>normal  . Anxiety   . Arthritis   . Asthma   . Bipolar 1 disorder (HCC)   . Depression     Past Surgical History:  Procedure Laterality Date  . NO PAST SURGERIES     Family History:  Family History  Problem Relation Age of Onset  . Diabetes Mother   . Hypertension Mother   . Heart disease Mother 52  . Schizophrenia Mother   . Diabetes Maternal Grandmother   . Heart disease Maternal Grandmother   . Diabetes Maternal Grandfather   . Heart disease Maternal Grandfather   . Bipolar disorder Cousin   . Bipolar disorder Nephew   . Depression Daughter    Family Psychiatric  History: See previous Social History:  Social History   Substance and Sexual Activity  Alcohol Use Yes   Comment: "a lot"     Social History   Substance and Sexual Activity  Drug Use Not Currently    Social History   Socioeconomic History  . Marital status: Single    Spouse name: Not on file  . Number of children: Not on file  . Years of  education: Not on file  . Highest education level: Not on file  Occupational History  . Not on file  Social Needs  . Financial resource strain: Not on file  . Food insecurity:    Worry: Not on file    Inability: Not on file  . Transportation needs:    Medical: Not on file    Non-medical: Not on file  Tobacco Use  . Smoking status: Current Every Day Smoker    Packs/day: 1.50    Types: Cigarettes, E-cigarettes  . Smokeless tobacco: Never Used  Substance and Sexual Activity  . Alcohol use: Yes    Comment: "a lot"  . Drug use: Not Currently  . Sexual activity: Yes    Partners: Male    Birth control/protection: None  Lifestyle  . Physical activity:    Days per week: Not on file    Minutes per session: Not on file  . Stress: Not on file  Relationships  . Social connections:    Talks on phone: Not on file    Gets together: Not on file    Attends religious service: Not on file    Active member of club or organization: Not on file    Attends meetings of clubs or organizations: Not on file    Relationship status: Not on file  Other Topics Concern  . Not on file  Social History Narrative  . Not on file  Additional Social History:                         Sleep: Fair  Appetite:  Fair  Current Medications: Current Facility-Administered Medications  Medication Dose Route Frequency Provider Last Rate Last Dose  . acetaminophen (TYLENOL) tablet 650 mg  650 mg Oral Q6H PRN Mariel Craft, MD   650 mg at 11/05/18 1550  . alum & mag hydroxide-simeth (MAALOX/MYLANTA) 200-200-20 MG/5ML suspension 30 mL  30 mL Oral Q4H PRN Mariel Craft, MD      . ARIPiprazole (ABILIFY) tablet 30 mg  30 mg Oral Daily Kashawn Dirr, [name], MD   30 mg at 11/05/18 0830  . ARIPiprazole ER (ABILIFY MAINTENA) injection 400 mg  400 mg Intramuscular Q28 days Jonquil Stubbe, [name], MD   400 mg at 11/01/18 1300  . hydrocerin (EUCERIN) cream   Topical BID Josie Mesa T, MD      . hydrOXYzine  (ATARAX/VISTARIL) tablet 50 mg  50 mg Oral Q6H PRN Mariel Craft, MD   50 mg at 11/05/18 0830  . lithium carbonate (ESKALITH) CR tablet 900 mg  900 mg Oral QHS Maizee Reinhold T, MD      . magnesium hydroxide (MILK OF MAGNESIA) suspension 30 mL  30 mL Oral Daily PRN Mariel Craft, MD   30 mL at 11/02/18 1125  . meloxicam (MOBIC) tablet 15 mg  15 mg Oral Daily Ethelyn Cerniglia T, MD   15 mg at 11/05/18 0830  . nicotine (NICODERM CQ - dosed in mg/24 hours) patch 21 mg  21 mg Transdermal Daily Erynne Kealey, [name], MD   21 mg at 11/05/18 0834  . OLANZapine zydis (ZYPREXA) disintegrating tablet 10 mg  10 mg Oral Q8H PRN Mariel Craft, MD   10 mg at 11/04/18 2104  . pantoprazole (PROTONIX) EC tablet 40 mg  40 mg Oral Daily Mariel Craft, MD   40 mg at 11/05/18 0834  . sertraline (ZOLOFT) tablet 50 mg  50 mg Oral Daily Mariel Craft, MD   50 mg at 11/05/18 0831  . traZODone (DESYREL) tablet 100 mg  100 mg Oral QHS Mariel Craft, MD   100 mg at 11/04/18 2242    Lab Results:  Results for orders placed or performed during the hospital encounter of 10/31/18 (from the past 48 hour(s))  Lithium level     Status: Abnormal   Collection Time: 11/05/18  6:34 AM  Result Value Ref Range   Lithium Lvl 0.48 (L) 0.60 - 1.20 mmol/L    Comment: Performed at [facility], [address] [place], [place] [address]    Blood Alcohol level:  Lab Results  Component Value Date   [facility] <10 10/30/2018   ETH <10 10/15/2018    Metabolic Disorder Labs: Lab Results  Component Value Date   HGBA1C 5.0 10/09/2018   MPG 96.8 10/09/2018   MPG 96.8 03/21/2018   Lab Results  Component Value Date   PROLACTIN 39.5 (H) 04/12/2018   PROLACTIN 2.0 12/05/2011   Lab Results  Component Value Date   CHOL 215 (H) 10/09/2018   TRIG 83 10/09/2018   HDL 85 10/09/2018   CHOLHDL 2.5 10/09/2018   VLDL 17 10/09/2018   LDLCALC 113 (H) 10/09/2018   LDLCALC 118 (H) 03/21/2018    Physical Findings: AIMS:  , ,  ,   ,    CIWA:    COWS:     Musculoskeletal:  Strength & Muscle Tone: within normal limits Gait & Station: normal Patient leans: N/A  Psychiatric Specialty Exam: Physical Exam  Nursing note and vitals reviewed. Constitutional: She appears well-developed and well-nourished.  HENT:  Head: Normocephalic and atraumatic.  Eyes: Pupils are equal, round, and reactive to light. Conjunctivae are normal.  Neck: Normal range of motion.  Cardiovascular: Regular rhythm and normal heart sounds.  Respiratory: Effort normal. No respiratory distress.  GI: Soft.  Musculoskeletal: Normal range of motion.  Neurological: She is alert.  Skin: Skin is warm and dry.  Psychiatric: Her affect is labile and inappropriate. Her speech is rapid and/or pressured and tangential. She is agitated. Thought content is paranoid. Cognition and memory are impaired. She expresses impulsivity. She expresses no homicidal and no suicidal ideation.    Review of Systems  Constitutional: Negative.   HENT: Negative.   Eyes: Negative.   Respiratory: Negative.   Cardiovascular: Negative.   Gastrointestinal: Negative.   Musculoskeletal: Negative.   Skin: Negative.   Neurological: Negative.   Psychiatric/Behavioral: Negative for depression, hallucinations, memory loss, substance abuse and suicidal ideas. The patient is not nervous/anxious and does not have insomnia.     Blood pressure 133/78, pulse 79, temperature 97.8 F (36.6 C), temperature source Oral, resp. rate 16, height 5\' 5"  (1.651 m), weight 72.6 kg, last menstrual period 10/30/2018, SpO2 100 %.Body mass index is 26.63 kg/m.  General Appearance: Casual  Eye Contact:  Good  Speech:  Pressured  Volume:  Increased  Mood:  Irritable  Affect:  Congruent and Labile  Thought Process:  Disorganized  Orientation:  Full (Time, Place, and Person)  Thought Content:  Illogical and Paranoid Ideation  Suicidal Thoughts:  No  Homicidal Thoughts:  No  Memory:  Immediate;    Fair Recent;   Fair Remote;   Fair  Judgement:  Fair  Insight:  Lacking  Psychomotor Activity:  Increased  Concentration:  Concentration: Fair  Recall:  FiservFair  Fund of Knowledge:  Fair  Language:  Fair  Akathisia:  No  Handed:  Right  AIMS (if indicated):     Assets:  Manufacturing systems engineerCommunication Skills Housing Physical Health Resilience Social Support  ADL's:  Intact  Cognition:  Impaired,  Mild  Sleep:  Number of Hours: 4     Treatment Plan Summary: Daily contact with patient to assess and evaluate symptoms and progress in treatment, Medication management and Plan Still manic.  Poor insight.  Increasing lithium to 900 mg tonight.  Continue with Abilify which has recently been increased to 30 mg.  Work to make sure that the staff and treatment team realize that this is the patient's illness and we are trying to get her better.  She can be very disruptive on the unit and draw some negative attention to herself.  Commitment happening this week which will determine whether we can keep her longer for treatment  Mordecai RasmussenJohn Lauria Depoy, MD 11/05/2018, 4:49 PM

## 2018-11-05 NOTE — Progress Notes (Signed)
D- Patient alert and oriented. Persistent anxiety and distractibility slow her adaptation to new strategies for more effective coping. Denies SI, HI, AVH, and pain. Quotes "I divorced my family". Goal:  getting patience. Previous goal setting met with minimal success. A- Scheduled and prn medications administered to patient per MD orders. Support and encouragement provided.  Routine safety checks conducted every 15 minutes.  Patient informed to notify staff with problems or concerns. R- No adverse drug reactions noted. Patient contracts for safety at this time. Patient continues with unrelenting anxiety and hyperactivity. Patient remains safe at this time.

## 2018-11-05 NOTE — Plan of Care (Signed)
Patient is alert and oriented X 4. Patient denies SI, HI and AVH. Patient is animated, can be labile, enjoys being on the unit around peers. Patient causes other patients on the unit to become irritated due to becoming demanding of other patients. Safety checks to continue Q 15 minutes. Problem: Education: Goal: Ability to state activities that reduce stress will improve Outcome: Progressing   Problem: Self-Concept: Goal: Ability to identify factors that promote anxiety will improve Outcome: Progressing Goal: Level of anxiety will decrease Outcome: Progressing Goal: Ability to modify response to factors that promote anxiety will improve Outcome: Progressing   Problem: Activity: Goal: Interest or engagement in leisure activities will improve Outcome: Progressing Goal: Imbalance in normal sleep/wake cycle will improve Outcome: Progressing

## 2018-11-05 NOTE — Plan of Care (Signed)
Patient observed to continue with hyperactivity, high distractibility and attention seeking. Does not verbalize feelings  but does employ manipulative strategies to obtain her objective. Inconsistent interest in social connections. She demonstrates strong interest in art work that she shares with staff. Her ability to identify factors that lower anxiety is marginal.  Problem: Activity: Goal: Interest or engagement in leisure activities will improve Outcome: Progressing   Problem: Education: Goal: Will be free of psychotic symptoms Outcome: Progressing   Problem: Self-Concept: Goal: Level of anxiety will decrease Outcome: Not Progressing   Problem: Activity: Goal: Imbalance in normal sleep/wake cycle will improve Outcome: Not Progressing

## 2018-11-05 NOTE — [facility] Group Notes (Signed)
Overcoming Obstacles  11/05/2018 1PM  Type of Therapy and Topic:  Group Therapy:  Overcoming Obstacles  Participation Level:  Active    Description of Group:    In this group patients will be encouraged to explore what they see as obstacles to their own wellness and recovery. They will be guided to discuss their thoughts, feelings, and behaviors related to these obstacles. The group will process together ways to cope with barriers, with attention given to specific choices patients can make. Each patient will be challenged to identify changes they are motivated to make in order to overcome their obstacles. This group will be process-oriented, with patients participating in exploration of their own experiences as well as giving and receiving support and challenge from other group members.   Therapeutic Goals: 1. Patient will identify personal and current obstacles as they relate to admission. 2. Patient will identify barriers that currently interfere with their wellness or overcoming obstacles.  3. Patient will identify feelings, thought process and behaviors related to these barriers. 4. Patient will identify two changes they are willing to make to overcome these obstacles:      Summary of Patient Progress Pt had to be redirected numerous times when she got off topic. Pt became upset with facilitator when she was encouraged to practice social distancing and not sit so closely to female group member. Pt shared with group the discord between her and her immediate family. Pt says when she returns home her goal is"to do her."    Therapeutic Modalities:   Cognitive Behavioral Therapy Solution Focused Therapy Motivational Interviewing Relapse Prevention Therapy    Lowella Dandy, MSW, LCSW 11/05/2018 2:33 PM

## 2018-11-06 NOTE — [facility] Group Notes (Signed)
LCSW Group Therapy Note  11/06/2018 1:00 PM  Type of Therapy/Topic:  Group Therapy:  Feelings about Diagnosis  Participation Level:  Active   Description of Group:   This group will allow patients to explore their thoughts and feelings about diagnoses they have received. Patients will be guided to explore their level of understanding and acceptance of these diagnoses. Facilitator will encourage patients to process their thoughts and feelings about the reactions of others to their diagnosis and will guide patients in identifying ways to discuss their diagnosis with significant others in their lives. This group will be process-oriented, with patients participating in exploration of their own experiences, giving and receiving support, and processing challenge from other group members.   Therapeutic Goals: 1. Patient will demonstrate understanding of diagnosis as evidenced by identifying two or more symptoms of the disorder 2. Patient will be able to express two feelings regarding the diagnosis 3. Patient will demonstrate their ability to communicate their needs through discussion and/or role play  Summary of Patient Progress: Patient was present in group.  Patient arrived late.  Patient did appear to be upset at beginning of gorup as evidenced by leaving and slamming the door.  Patient later came back and apologized and shared that the MHT had upset her.  Patient displayed very tangential speech while in group and grandiose statements. At one point in group, CSW notes, the patient stood up with arms in the air and stated "look at me now, they left me for dead and I look good".  Statement was not appropriate for present conversation topic and was not related.  CSW attempted to redirect the patient several times.    Therapeutic Modalities:   Cognitive Behavioral Therapy Brief Therapy Feelings Identification   Penni Homans, MSW, LCSW 11/06/2018 2:32 PM

## 2018-11-06 NOTE — Plan of Care (Signed)
Patient was anxious and hyperactive this evening. Patient was compliant with medications and procedures on the unit.   Problem: Self-Concept: Goal: Level of anxiety will decrease Outcome: Not Progressing

## 2018-11-06 NOTE — Plan of Care (Signed)
Pt denies depression , anxiety, SI, HI, and AVH. Pt slept "very well " Pt has a goal to "take it easy". Pt was educated on care plan and verbalizes understanding. Torrie Mayers RN Problem: Education: Goal: Ability to state activities that reduce stress will improve Outcome: Progressing   Problem: Self-Concept: Goal: Ability to identify factors that promote anxiety will improve Outcome: Progressing Goal: Level of anxiety will decrease Outcome: Progressing Goal: Ability to modify response to factors that promote anxiety will improve Outcome: Progressing   Problem: Activity: Goal: Interest or engagement in leisure activities will improve Outcome: Progressing Goal: Imbalance in normal sleep/wake cycle will improve Outcome: Progressing   Problem: Coping: Goal: Coping ability will improve Outcome: Progressing Goal: Will verbalize feelings Outcome: Progressing   Problem: Health Behavior/Discharge Planning: Goal: Ability to make decisions will improve Outcome: Progressing Goal: Compliance with therapeutic regimen will improve Outcome: Progressing   Problem: Safety: Goal: Ability to disclose and discuss suicidal ideas will improve Outcome: Progressing Goal: Ability to identify and utilize support systems that promote safety will improve Outcome: Progressing   Problem: Education: Goal: Will be free of psychotic symptoms Outcome: Not Progressing Goal: Knowledge of the prescribed therapeutic regimen will improve Outcome: Progressing   Problem: Health Behavior/Discharge Planning: Goal: Compliance with prescribed medication regimen will improve Outcome: Progressing   Problem: Self-Care: Goal: Ability to participate in self-care as condition permits will improve Outcome: Progressing

## 2018-11-06 NOTE — Progress Notes (Signed)
D - Patient was in the day room upon arrival. Patient was pleasant this evening. Patient was hyperactive but there were not outbursts from her like she had last week. 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 Patient rated her pain 9/10. (See MAR)  A - Patient compliant with medication administration per MD orders. Patient given education. Patient given support and encouragement to be active in her treatment plan. Patient informed to let staff know if there are any issues or problems on the unit.   R - Patient being monitored Q 15 minutes for safety per unit protocol. Patient remains safe on the unit.

## 2018-11-06 NOTE — [facility] Counselor (Signed)
CSW spoke with primary therapist Thalia Bloodgood, [phone], in an effort to schedule the patient for an aftercare appointment.  Misty Stanley voiced concerns that the patient needs a higher level of care with [facility].  CSW reported that this has not been supported by patient at this time and she will need to provide permission and patient was pretty adamant that she continue with Misty Stanley for outpatient.   Misty Stanley appeared to become upset "she is a mental health patient and you all are listening to her when it comes to appointments".  CSW explained that patient has to provide permission for CSW to make referrals and share her information with anyone.   Misty Stanley often interrupted and talked over CSW.   CSW informed that she can staff this with psychiatrist, patient and treatment team and once permission is given make a referral to [facility].  CSW pointed out that pt reported, following a conversation with the primary therapist, that primary therapist would be making a referral to [facility].  CSW attempted to confirm and Misty Stanley, again appeared irritated and reported that she would "not be making the referral since it need to come from the hospital".    CSW will follow up with psychiatrist, pt and treatment team and make any appropriate referrals.   Penni Homans, MSW, LCSW 11/06/2018 4:10 PM

## 2018-11-06 NOTE — Progress Notes (Signed)
[facility] MD Progress Note  11/06/2018 5:48 PM Kelly Carr  MRN:  [id number] Subjective: Continues to be manic and agitated.  Today she had a fight with her adult children on the phone and then posted a big sign on her door saying that she was "divorced from my family".  She came in to talk with me this afternoon and talked for 20 or 30 minutes without pausing once never much holding the thought together making sense often repeating herself.  Talking a lot about finances great plans for the future's church is that she is going to found that sort of thing.  She is taking care of her basic ADLs.  She has not been violent or threatening today.  I did just increase her lithium dose which she seems to be tolerating okay. Principal Problem: Bipolar disorder with severe mania (HCC) Diagnosis: Principal Problem:   Bipolar disorder with severe mania (HCC) Active Problems:   GERD (gastroesophageal reflux disease)   Noncompliance   Cannabis abuse  Total Time spent with patient: 30 minutes  Past Psychiatric History: Patient has a past history of bipolar disorder with multiple manic admissions this year  Past Medical History:  Past Medical History:  Diagnosis Date  . Abnormal Pap smear    Unknown results>colpo>normal  . Anxiety   . Arthritis   . Asthma   . Bipolar 1 disorder (HCC)   . Depression     Past Surgical History:  Procedure Laterality Date  . NO PAST SURGERIES     Family History:  Family History  Problem Relation Age of Onset  . Diabetes Mother   . Hypertension Mother   . Heart disease Mother 14  . Schizophrenia Mother   . Diabetes Maternal Grandmother   . Heart disease Maternal Grandmother   . Diabetes Maternal Grandfather   . Heart disease Maternal Grandfather   . Bipolar disorder Cousin   . Bipolar disorder Nephew   . Depression Daughter    Family Psychiatric  History: Patient's description of her mother suggest that her mother had bipolar disorder as well Social History:   Social History   Substance and Sexual Activity  Alcohol Use Yes   Comment: "a lot"     Social History   Substance and Sexual Activity  Drug Use Not Currently    Social History   Socioeconomic History  . Marital status: Single    Spouse name: Not on file  . Number of children: Not on file  . Years of education: Not on file  . Highest education level: Not on file  Occupational History  . Not on file  Social Needs  . Financial resource strain: Not on file  . Food insecurity:    Worry: Not on file    Inability: Not on file  . Transportation needs:    Medical: Not on file    Non-medical: Not on file  Tobacco Use  . Smoking status: Current Every Day Smoker    Packs/day: 1.50    Types: Cigarettes, E-cigarettes  . Smokeless tobacco: Never Used  Substance and Sexual Activity  . Alcohol use: Yes    Comment: "a lot"  . Drug use: Not Currently  . Sexual activity: Yes    Partners: Male    Birth control/protection: None  Lifestyle  . Physical activity:    Days per week: Not on file    Minutes per session: Not on file  . Stress: Not on file  Relationships  . Social connections:  Talks on phone: Not on file    Gets together: Not on file    Attends religious service: Not on file    Active member of club or organization: Not on file    Attends meetings of clubs or organizations: Not on file    Relationship status: Not on file  Other Topics Concern  . Not on file  Social History Narrative  . Not on file   Additional Social History:                         Sleep: Fair  Appetite:  Fair  Current Medications: Current Facility-Administered Medications  Medication Dose Route Frequency Provider Last Rate Last Dose  . acetaminophen (TYLENOL) tablet 650 mg  650 mg Oral Q6H PRN Mariel Craft, MD   650 mg at 11/06/18 0636  . alum & mag hydroxide-simeth (MAALOX/MYLANTA) 200-200-20 MG/5ML suspension 30 mL  30 mL Oral Q4H PRN Mariel Craft, MD      .  ARIPiprazole (ABILIFY) tablet 30 mg  30 mg Oral Daily Vedant Shehadeh, [name], MD   30 mg at 11/06/18 0738  . ARIPiprazole ER (ABILIFY MAINTENA) injection 400 mg  400 mg Intramuscular Q28 days Helen Cuff, [name], MD   400 mg at 11/01/18 1300  . hydrocerin (EUCERIN) cream   Topical BID Bensyn Bornemann T, MD      . hydrOXYzine (ATARAX/VISTARIL) tablet 50 mg  50 mg Oral Q6H PRN Mariel Craft, MD   50 mg at 11/06/18 0636  . lithium carbonate (ESKALITH) CR tablet 900 mg  900 mg Oral QHS Derral Colucci, [name], MD   900 mg at 11/05/18 2253  . magnesium hydroxide (MILK OF MAGNESIA) suspension 30 mL  30 mL Oral Daily PRN Mariel Craft, MD   30 mL at 11/02/18 1125  . meloxicam (MOBIC) tablet 15 mg  15 mg Oral Daily Kenton Fortin, [name], MD   15 mg at 11/06/18 0738  . nicotine (NICODERM CQ - dosed in mg/24 hours) patch 21 mg  21 mg Transdermal Daily Kiaira Pointer, [name], MD   21 mg at 11/05/18 0834  . OLANZapine zydis (ZYPREXA) disintegrating tablet 10 mg  10 mg Oral Q8H PRN Mariel Craft, MD   10 mg at 11/04/18 2104  . pantoprazole (PROTONIX) EC tablet 40 mg  40 mg Oral Daily Mariel Craft, MD   40 mg at 11/06/18 0739  . sertraline (ZOLOFT) tablet 50 mg  50 mg Oral Daily Mariel Craft, MD   50 mg at 11/06/18 0740  . traZODone (DESYREL) tablet 100 mg  100 mg Oral QHS Mariel Craft, MD   100 mg at 11/05/18 2253    Lab Results:  Results for orders placed or performed during the hospital encounter of 10/31/18 (from the past 48 hour(s))  Lithium level     Status: Abnormal   Collection Time: 11/05/18  6:34 AM  Result Value Ref Range   Lithium Lvl 0.48 (L) 0.60 - 1.20 mmol/L    Comment: Performed at [facility], [address] [place], [place] [address]    Blood Alcohol level:  Lab Results  Component Value Date   [facility] <10 10/30/2018   ETH <10 10/15/2018    Metabolic Disorder Labs: Lab Results  Component Value Date   HGBA1C 5.0 10/09/2018   MPG 96.8 10/09/2018   MPG 96.8 03/21/2018   Lab Results   Component Value Date   PROLACTIN 39.5 (H)  04/12/2018   PROLACTIN 2.0 12/05/2011   Lab Results  Component Value Date   CHOL 215 (H) 10/09/2018   TRIG 83 10/09/2018   HDL 85 10/09/2018   CHOLHDL 2.5 10/09/2018   VLDL 17 10/09/2018   LDLCALC 113 (H) 10/09/2018   LDLCALC 118 (H) 03/21/2018    Physical Findings: AIMS:  , ,  ,  ,    CIWA:    COWS:     Musculoskeletal: Strength & Muscle Tone: within normal limits Gait & Station: normal Patient leans: N/A  Psychiatric Specialty Exam: Physical Exam  Nursing note and vitals reviewed. Constitutional: She appears well-developed and well-nourished.  HENT:  Head: Normocephalic and atraumatic.  Eyes: Pupils are equal, round, and reactive to light. Conjunctivae are normal.  Neck: Normal range of motion.  Cardiovascular: Regular rhythm and normal heart sounds.  Respiratory: Effort normal.  GI: Soft.  Musculoskeletal: Normal range of motion.  Neurological: She is alert.  Skin: Skin is warm and dry.  Psychiatric: Her affect is labile. Her speech is rapid and/or pressured and tangential. She is agitated. Thought content is delusional. Cognition and memory are impaired. She expresses impulsivity and inappropriate judgment.    Review of Systems  Constitutional: Negative.   HENT: Negative.   Eyes: Negative.   Respiratory: Negative.   Cardiovascular: Negative.   Gastrointestinal: Negative.   Musculoskeletal: Negative.   Skin: Negative.   Neurological: Negative.   Psychiatric/Behavioral: Negative.     Blood pressure (!) 163/78, pulse 99, temperature (!) 97.4 F (36.3 C), temperature source Oral, resp. rate 18, height 5\' 5"  (1.651 m), weight 72.6 kg, last menstrual period 10/30/2018, SpO2 100 %.Body mass index is 26.63 kg/m.  General Appearance: Casual  Eye Contact:  Fair  Speech:  Pressured  Volume:  Increased  Mood:  Euphoric  Affect:  Inappropriate and Labile  Thought Process:  Disorganized  Orientation:  Full (Time, Place,  and Person)  Thought Content:  Illogical, Rumination and Tangential  Suicidal Thoughts:  No  Homicidal Thoughts:  No  Memory:  Immediate;   Fair Recent;   Fair Remote;   Fair  Judgement:  Impaired  Insight:  Shallow  Psychomotor Activity:  Increased and Restlessness  Concentration:  Concentration: Poor  Recall:  Poor  Fund of Knowledge:  Fair  Language:  Fair  Akathisia:  No  Handed:  Right  AIMS (if indicated):     Assets:  Desire for Improvement Resilience  ADL's:  Impaired  Cognition:  WNL  Sleep:  Number of Hours: 1.75     Treatment Plan Summary: Daily contact with patient to assess and evaluate symptoms and progress in treatment, Medication management and Plan If anything she has been even more manic the last couple days.  She is up and active racing thoughts but not hostile.  I think that perhaps my being clear with her about how long she was going to stay in the hospital may have triggered her to get more agitated.  I have increased her lithium level.  She is at a higher antipsychotic dose.  May need to add something extra to help her sleep.  Encourage patient to try to slow her self down.  Tried to help her form insight.  Blood pressure is a little up today but has not been consistent so we will hold off on changing medicine yet.  Mordecai RasmussenJohn Cherrie Franca, MD 11/06/2018, 5:48 PM

## 2018-11-06 NOTE — Progress Notes (Signed)
Recreation Therapy Notes   Date: 11/06/2018  Time: 9:30 am  Location: Craft room  Behavioral response: Appropriate, Hyperverbal   Intervention Topic: Anger Management   Discussion/Intervention:  Group content on today was focused on anger management. The group defined anger and reasons they become angry. Individuals expressed negative way they have dealt with anger in the past. Patients stated some positive ways they could deal with anger in the future. The group described how anger can affect your health and daily plans. Individuals participated in the intervention "Score your anger" where they had a chance to answer questions about themselves and get a score of their anger.   Clinical Observations/Feedback:  Patient came to group and stated that she is passive a lot. She expresses that the relationship between her family makes her angry. Participant explained that she could improve her anger management by focusing on herself, breathing and asking for help. Individual was social with staff and peers while participating in the intervention. Kelly Carr LRT/CTRS         Kelly Carr 11/06/2018 11:30 AM

## 2018-11-07 MED ORDER — SERTRALINE HCL 25 MG PO TABS: 25.0000 mg | ORAL_TABLET | Freq: Every day | ORAL | Status: DC

## 2018-11-07 MED ORDER — ZIPRASIDONE MESYLATE 20 MG IM SOLR
20.0000 mg | Freq: Two times a day (BID) | INTRAMUSCULAR | Status: DC | PRN
  Administered 2018-11-07 – 2018-11-10 (×3): 20 mg via INTRAMUSCULAR
  Filled 2018-11-07 (×3): qty 20

## 2018-11-07 MED ORDER — ZOLPIDEM TARTRATE 5 MG PO TABS
5.0000 mg | ORAL_TABLET | Freq: Every day | ORAL | Status: DC
  Administered 2018-11-07 – 2018-11-09 (×2): 5 mg via ORAL
  Filled 2018-11-07 (×3): qty 1

## 2018-11-07 NOTE — Progress Notes (Signed)
D - Patient was in the day room upon arrival. Patient was pleasant this evening. Patient was hyperactive but there were not outbursts from her like she had last week. Patient presents with a hyperactive affect. Patient believes she is leaving tomorrow and says she feels ready.   A - Patient compliant with medication administration per MD orders. Patient given education. Patient given support and encouragement to be active in her treatment plan. Patient informed to let staff know if there are any issues or problems on the unit.   R - Patient being monitored Q 15 minutes for safety per unit protocol. Patient remains safe on the unit.

## 2018-11-07 NOTE — Plan of Care (Signed)
D- Patient alert and oriented. Patient presented in a hyperactive/agitated mood on assessment because she was upset that she wasn't going home today and stated that she was told she would be leaving today. Patient states that she slept "like a baby" last night and continues to endorse a pain level of "5/10" in her right knee, in which she only took her prescribed medication. Patient denies any signs/symptoms of depression/anxiety to this Clinical research associate. Patient denies SI, HI, AVH, at this time. Patient's goal for today is "staying focused on me", in which she will "breathe" in order to accomplish her goal.  A- Scheduled medications administered to patient, per MD orders. Support and encouragement provided.  Routine safety checks conducted every 15 minutes.  Patient informed to notify staff with problems or concerns.  R- No adverse drug reactions noted. Patient contracts for safety at this time. Patient compliant with medications and treatment plan. Patient receptive, calm, and cooperative. Patient interacts well with others on the unit.  Patient remains safe at this time.  Problem: Education: Goal: Ability to state activities that reduce stress will improve Outcome: Progressing   Problem: Self-Concept: Goal: Ability to identify factors that promote anxiety will improve Outcome: Progressing Goal: Level of anxiety will decrease Outcome: Progressing Goal: Ability to modify response to factors that promote anxiety will improve Outcome: Progressing   Problem: Activity: Goal: Interest or engagement in leisure activities will improve Outcome: Progressing Goal: Imbalance in normal sleep/wake cycle will improve Outcome: Progressing   Problem: Coping: Goal: Coping ability will improve Outcome: Progressing Goal: Will verbalize feelings Outcome: Progressing   Problem: Health Behavior/Discharge Planning: Goal: Ability to make decisions will improve Outcome: Progressing Goal: Compliance with therapeutic  regimen will improve Outcome: Progressing   Problem: Safety: Goal: Ability to disclose and discuss suicidal ideas will improve Outcome: Progressing Goal: Ability to identify and utilize support systems that promote safety will improve Outcome: Progressing   Problem: Education: Goal: Will be free of psychotic symptoms Outcome: Progressing Goal: Knowledge of the prescribed therapeutic regimen will improve Outcome: Progressing   Problem: Health Behavior/Discharge Planning: Goal: Compliance with prescribed medication regimen will improve Outcome: Progressing   Problem: Self-Care: Goal: Ability to participate in self-care as condition permits will improve Outcome: Progressing

## 2018-11-07 NOTE — Tx Team (Signed)
Interdisciplinary Treatment and Diagnostic Plan Update  11/07/2018 Time of Session: 8:30AM Kelly Carr MRN: [id number]  Principal Diagnosis: Bipolar disorder with severe mania (HCC)  Secondary Diagnoses: Principal Problem:   Bipolar disorder with severe mania (HCC) Active Problems:   GERD (gastroesophageal reflux disease)   Noncompliance   Cannabis abuse   Current Medications:  Current Facility-Administered Medications  Medication Dose Route Frequency Provider Last Rate Last Dose  . acetaminophen (TYLENOL) tablet 650 mg  650 mg Oral Q6H PRN Kelly Carr   650 mg at 11/07/18 [phone]  . alum & mag hydroxide-simeth (MAALOX/MYLANTA) 200-200-20 MG/5ML suspension 30 mL  30 mL Oral Q4H PRN Kelly Carr      . ARIPiprazole (ABILIFY) tablet 30 mg  30 mg Oral Daily Kelly Carr   30 mg at 11/07/18 0831  . ARIPiprazole ER (ABILIFY MAINTENA) injection 400 mg  400 mg Intramuscular Q28 days Kelly Carr   400 mg at 11/01/18 1300  . hydrocerin (EUCERIN) cream   Topical BID [name], Kelly Carr      . hydrOXYzine (ATARAX/VISTARIL) tablet 50 mg  50 mg Oral Q6H PRN Kelly Carr   50 mg at 11/07/18 [phone]  . lithium carbonate (ESKALITH) CR tablet 900 mg  900 mg Oral QHS [name], Kelly Carr   900 mg at 11/06/18 2241  . magnesium hydroxide (MILK OF MAGNESIA) suspension 30 mL  30 mL Oral Daily PRN Kelly Carr   30 mL at 11/02/18 1125  . meloxicam (MOBIC) tablet 15 mg  15 mg Oral Daily Kelly Carr   15 mg at 11/07/18 4782  . nicotine (NICODERM CQ - dosed in mg/24 hours) patch 21 mg  21 mg Transdermal Daily Kelly Carr   21 mg at 11/07/18 9562  . OLANZapine zydis (ZYPREXA) disintegrating tablet 10 mg  10 mg Oral Q8H PRN Kelly Carr   10 mg at 11/06/18 2243  . pantoprazole (PROTONIX) EC tablet 40 mg  40 mg Oral Daily Kelly Carr   40 mg at 11/07/18 1308  . sertraline (ZOLOFT) tablet 50 mg  50 mg Oral Daily Kelly Carr   50 mg  at 11/07/18 6578  . traZODone (DESYREL) tablet 100 mg  100 mg Oral QHS Kelly Carr   100 mg at 11/06/18 2240  . ziprasidone (GEODON) injection 20 mg  20 mg Intramuscular BID PRN Kelly Carr   20 mg at 11/07/18 0920   PTA Medications: Medications Prior to Admission  Medication Sig Dispense Refill Last Dose  . ARIPiprazole (ABILIFY) 20 MG tablet Take 1 tablet (20 mg total) by mouth daily. 30 tablet 1 Past Week at Unknown time  . hydrOXYzine (ATARAX/VISTARIL) 50 MG tablet Take 1 tablet (50 mg total) by mouth every 6 (six) hours as needed for itching or anxiety. 60 tablet 1 Past Week at Unknown time  . oxcarbazepine (TRILEPTAL) 600 MG tablet Take 1 tablet (600 mg total) by mouth 2 (two) times daily. 60 tablet 1 Past Week at Unknown time  . pantoprazole (PROTONIX) 40 MG tablet Take 1 tablet (40 mg total) by mouth daily. 30 tablet 1 Past Week at Unknown time  . sertraline (ZOLOFT) 50 MG tablet Take 1 tablet (50 mg total) by mouth daily. 30 tablet 1 Past Week at Unknown time  . traZODone (DESYREL) 100 MG tablet Take 1 tablet (100 mg total) by mouth at bedtime.  30 tablet 1 Past Week at Unknown time  . ARIPiprazole ER (ABILIFY MAINTENA) 400 MG SRER injection Inject 2 mLs (400 mg total) into the muscle every 28 (twenty-eight) days. (Patient not taking: Reported on 10/30/2018) 1 each 1 Not Taking at Unknown time    Patient Stressors: Medication change or noncompliance Other: Covid 19 worries   Patient Strengths: Physical Health Work skills  Treatment Modalities: Medication Management, Group therapy, Case management,  1 to 1 session with clinician, Psychoeducation, Recreational therapy.   Physician Treatment Plan for Primary Diagnosis: Bipolar disorder with severe mania (HCC) Long Term Goal(s): Improvement in symptoms so as ready for discharge Improvement in symptoms so as ready for discharge   Short Term Goals: Ability to identify changes in lifestyle to reduce recurrence of  condition will improve Ability to verbalize feelings will improve Ability to demonstrate self-control will improve Compliance with prescribed medications will improve Ability to identify triggers associated with substance abuse/mental health issues will improve  Medication Management: Evaluate patient's response, side effects, and tolerance of medication regimen.  Therapeutic Interventions: 1 to 1 sessions, Unit Group sessions and Medication administration.  Evaluation of Outcomes: Progressing  Physician Treatment Plan for Secondary Diagnosis: Principal Problem:   Bipolar disorder with severe mania (HCC) Active Problems:   GERD (gastroesophageal reflux disease)   Noncompliance   Cannabis abuse  Long Term Goal(s): Improvement in symptoms so as ready for discharge Improvement in symptoms so as ready for discharge   Short Term Goals: Ability to identify changes in lifestyle to reduce recurrence of condition will improve Ability to verbalize feelings will improve Ability to demonstrate self-control will improve Compliance with prescribed medications will improve Ability to identify triggers associated with substance abuse/mental health issues will improve     Medication Management: Evaluate patient's response, side effects, and tolerance of medication regimen.  Therapeutic Interventions: 1 to 1 sessions, Unit Group sessions and Medication administration.  Evaluation of Outcomes: Progressing   RN Treatment Plan for Primary Diagnosis: Bipolar disorder with severe mania (HCC) Long Term Goal(s): Knowledge of disease and therapeutic regimen to maintain health will improve  Short Term Goals: Ability to verbalize frustration and anger appropriately will improve, Ability to demonstrate self-control, Ability to participate in decision making will improve, Ability to verbalize feelings will improve, Ability to disclose and discuss suicidal ideas and Ability to identify and develop effective  coping behaviors will improve  Medication Management: RN will administer medications as ordered by provider, will assess and evaluate patient's response and provide education to patient for prescribed medication. RN will report any adverse and/or side effects to prescribing provider.  Therapeutic Interventions: 1 on 1 counseling sessions, Psychoeducation, Medication administration, Evaluate responses to treatment, Monitor vital signs and CBGs as ordered, Perform/monitor CIWA, COWS, AIMS and Fall Risk screenings as ordered, Perform wound care treatments as ordered.  Evaluation of Outcomes: Progressing   LCSW Treatment Plan for Primary Diagnosis: Bipolar disorder with severe mania (HCC) Long Term Goal(s): Safe transition to appropriate next level of care at discharge, Engage patient in therapeutic group addressing interpersonal concerns.  Short Term Goals: Engage patient in aftercare planning with referrals and resources, Increase social support, Increase ability to appropriately verbalize feelings, Increase emotional regulation and Facilitate acceptance of mental health diagnosis and concerns  Therapeutic Interventions: Assess for all discharge needs, 1 to 1 time with Social worker, Explore available resources and support systems, Assess for adequacy in community support network, Educate family and significant other(s) on suicide prevention, Complete Psychosocial Assessment, Interpersonal group therapy.  Evaluation of Outcomes: Progressing   Progress in Treatment: Attending groups: Yes. Participating in groups: Yes. Taking medication as prescribed: Yes. Toleration medication: Yes. Family/Significant other contact made: Yes, individual(s) contacted:  SPE completed with the patient's daughter.  Patient understands diagnosis: Yes. Discussing patient identified problems/goals with staff: Yes. Medical problems stabilized or resolved: Yes. Denies suicidal/homicidal ideation:  Yes. Issues/concerns per patient self-inventory: No. Other: none  New problem(s) identified: No, Describe:  none  New Short Term/Long Term Goal(s): medication management for mood stabilization; elimination of SI thoughts; development of comprehensive mental wellness plan.  Patient Goals:  "get my ducks in a row"  Discharge Plan or Barriers: Patient continues to reports plans to return to her home.  CSW has attempted on several occasions to schedule the patient with her current therapist for aftercare, however, her therapist Thalia Bloodgood has declined to schedule an appointment stating that she recommends the patient be referred to an CST team.  CSW has attempted to refer the patient, however, the patient continues to decline to sign a release allowing the referral. Patient has been referred for medication management appointment.    Reason for Continuation of Hospitalization: Anxiety Depression Mania Medication stabilization  Estimated Length of Stay: 1-5 days  Recreational Therapy: Patient Stressors: N/A Patient Goal: Patient will successfully identify 2 ways of making healthy decisions post d/c within 5 recreation therapy group sessions  Attendees: Patient: 11/07/2018 1:02 PM  Physician: Dr. Toni Amend, Carr 11/07/2018 1:02 PM  Nursing:  11/07/2018 1:02 PM  RN Care Manager: 11/07/2018 1:02 PM  Social Worker: Penni Homans, LCSW 11/07/2018 1:02 PM  Recreational Therapist:  11/07/2018 1:02 PM  Other:  11/07/2018 1:02 PM  Other:   11/07/2018 1:02 PM  Other: 11/07/2018 1:02 PM    Scribe for Treatment Team: Harden Mo, LCSW 11/07/2018 1:02 PM

## 2018-11-07 NOTE — Plan of Care (Signed)
Patient is med-seeking always saying her anxiety is really bad and needs something to help.   Problem: Self-Concept: Goal: Level of anxiety will decrease Outcome: Not Progressing

## 2018-11-07 NOTE — Progress Notes (Signed)
[facility] MD Progress Note  11/07/2018 4:57 PM Kelly Carr  MRN:  [id number] Subjective: Follow-up for this patient with mania.  Patient continues to be hyperverbal and disorganized in her thinking.  Grandiose in her thinking.  Inappropriate in her behavior.  Patient in speaking with me today made it clear that she thought she was being discharged.  She had no reason to think this.  I told her I did not think she was ready to be discharged at which point of course she became angry and started shouting and threatening me with lawyers again.  The charting indicates she is not sleeping well at night. Principal Problem: Bipolar disorder with severe mania (HCC) Diagnosis: Principal Problem:   Bipolar disorder with severe mania (HCC) Active Problems:   GERD (gastroesophageal reflux disease)   Noncompliance   Cannabis abuse  Total Time spent with patient: 30 minutes  Past Psychiatric History: Past history of bipolar disorder with multiple manic admissions  Past Medical History:  Past Medical History:  Diagnosis Date  . Abnormal Pap smear    Unknown results>colpo>normal  . Anxiety   . Arthritis   . Asthma   . Bipolar 1 disorder (HCC)   . Depression     Past Surgical History:  Procedure Laterality Date  . NO PAST SURGERIES     Family History:  Family History  Problem Relation Age of Onset  . Diabetes Mother   . Hypertension Mother   . Heart disease Mother 94  . Schizophrenia Mother   . Diabetes Maternal Grandmother   . Heart disease Maternal Grandmother   . Diabetes Maternal Grandfather   . Heart disease Maternal Grandfather   . Bipolar disorder Cousin   . Bipolar disorder Nephew   . Depression Daughter    Family Psychiatric  History: See previous Social History:  Social History   Substance and Sexual Activity  Alcohol Use Yes   Comment: "a lot"     Social History   Substance and Sexual Activity  Drug Use Not Currently    Social History   Socioeconomic History  .  Marital status: Single    Spouse name: Not on file  . Number of children: Not on file  . Years of education: Not on file  . Highest education level: Not on file  Occupational History  . Not on file  Social Needs  . Financial resource strain: Not on file  . Food insecurity:    Worry: Not on file    Inability: Not on file  . Transportation needs:    Medical: Not on file    Non-medical: Not on file  Tobacco Use  . Smoking status: Current Every Day Smoker    Packs/day: 1.50    Types: Cigarettes, E-cigarettes  . Smokeless tobacco: Never Used  Substance and Sexual Activity  . Alcohol use: Yes    Comment: "a lot"  . Drug use: Not Currently  . Sexual activity: Yes    Partners: Male    Birth control/protection: None  Lifestyle  . Physical activity:    Days per week: Not on file    Minutes per session: Not on file  . Stress: Not on file  Relationships  . Social connections:    Talks on phone: Not on file    Gets together: Not on file    Attends religious service: Not on file    Active member of club or organization: Not on file    Attends meetings of clubs or organizations: Not on  file    Relationship status: Not on file  Other Topics Concern  . Not on file  Social History Narrative  . Not on file   Additional Social History:                         Sleep: Poor  Appetite:  Fair  Current Medications: Current Facility-Administered Medications  Medication Dose Route Frequency Provider Last Rate Last Dose  . acetaminophen (TYLENOL) tablet 650 mg  650 mg Oral Q6H PRN [name], Sheila M, MD   650 mg at 11/07/18 [phone]  . alum & mag hydroxide-simeth (MAALOX/MYLANTA) 200-200-20 MG/5ML suspension 30 mL  30 mL Oral Q4H PRN [name], Sheila M, MD      . ARIPiprazole (ABILIFY) tablet 30 mg  30 mg Oral Daily Celines Femia, [name], MD   30 mg at 11/07/18 0831  . ARIPiprazole ER (ABILIFY MAINTENA) injection 400 mg  400 mg Intramuscular Q28 days Yadira Hada, [name], MD   400 mg at 11/01/18  1300  . hydrocerin (EUCERIN) cream   Topical BID Manas Hickling T, MD      . hydrOXYzine (ATARAX/VISTARIL) tablet 50 mg  50 mg Oral Q6H PRN [name], Sheila M, MD   50 mg at 11/07/18 1338  . lithium carbonate (ESKALITH) CR tablet 900 mg  900 mg Oral QHS Fionn Stracke T, MD   900 mg at 11/06/18 2241  . magnesium hydroxide (MILK OF MAGNESIA) suspension 30 mL  30 mL Oral Daily PRN [name], Sheila M, MD   30 mL at 11/02/18 1125  . meloxicam (MOBIC) tablet 15 mg  15 mg Oral Daily Quavion Boule, [name], MD   15 mg at 11/07/18 [id number]  . nicotine (NICODERM CQ - dosed in mg/24 hours) patch 21 mg  21 mg Transdermal Daily Dayveon Halley, [name], MD   21 mg at 11/07/18 [id number]  . OLANZapine zydis (ZYPREXA) disintegrating tablet 10 mg  10 mg Oral Q8H PRN [name], Sheila M, MD   10 mg at 11/06/18 2243  . pantoprazole (PROTONIX) EC tablet 40 mg  40 mg Oral Daily [name], Sheila M, MD   40 mg at 11/07/18 [id number]  . traZODone (DESYREL) tablet 100 mg  100 mg Oral QHS [name], Sheila M, MD   100 mg at 11/06/18 2240  . ziprasidone (GEODON) injection 20 mg  20 mg Intramuscular BID PRN Haliegh Khurana T, MD   20 mg at 11/07/18 0920  . zolpidem (AMBIEN) tablet 5 mg  5 mg Oral QHS Victorio Creeden T, MD        Lab Results: No results found for this or any previous visit (from the past 48 hour(s)).  Blood Alcohol level:  Lab Results  Component Value Date   ETH <10 10/30/2018   ETH <10 10/15/2018    Metabolic Disorder Labs: Lab Results  Component Value Date   HGBA1C 5.0 10/09/2018   MPG 96.8 10/09/2018   MPG 96.8 03/21/2018   Lab Results  Component Value Date   PROLACTIN 39.5 (H) 04/12/2018   PROLACTIN 2.0 12/05/2011   Lab Results  Component Value Date   CHOL 215 (H) 10/09/2018   TRIG 83 10/09/2018   HDL 85 10/09/2018   CHOLHDL 2.5 10/09/2018   VLDL 17 10/09/2018   LDLCALC 113 (H) 10/09/2018   LDLCALC 118 (H) 03/21/2018    Physical Findings: AIMS:  , ,  ,  ,    CIWA:    COWS:     Musculoskeletal: Strength &  Muscle Tone:  within normal limits Gait & Station: normal Patient leans: N/A  Psychiatric Specialty Exam: Physical Exam  Nursing note and vitals reviewed. Constitutional: She appears well-developed and well-nourished.  HENT:  Head: Normocephalic and atraumatic.  Eyes: Pupils are equal, round, and reactive to light. Conjunctivae are normal.  Neck: Normal range of motion.  Cardiovascular: Regular rhythm and normal heart sounds.  Respiratory: Effort normal. No respiratory distress.  GI: Soft.  Musculoskeletal: Normal range of motion.  Neurological: She is alert.  Skin: Skin is warm and dry.  Psychiatric: Her affect is labile and inappropriate. Her speech is rapid and/or pressured and tangential. She is agitated. She is not aggressive. Thought content is paranoid and delusional. Cognition and memory are impaired. She expresses impulsivity and inappropriate judgment. She is inattentive.    Review of Systems  Constitutional: Negative.   HENT: Negative.   Eyes: Negative.   Respiratory: Negative.   Cardiovascular: Negative.   Gastrointestinal: Negative.   Musculoskeletal: Negative.   Skin: Negative.   Neurological: Negative.   Psychiatric/Behavioral: Negative.     Blood pressure 135/77, pulse (!) 105, temperature 98.2 F (36.8 C), temperature source Oral, resp. rate 16, height 5\' 5"  (1.651 m), weight 72.6 kg, last menstrual period 10/30/2018, SpO2 99 %.Body mass index is 26.63 kg/m.  General Appearance: Casual  Eye Contact:  Fair  Speech:  Pressured  Volume:  Increased  Mood:  Angry, Euphoric and Irritable  Affect:  Congruent and Inappropriate  Thought Process:  Disorganized  Orientation:  Full (Time, Place, and Person)  Thought Content:  Illogical, Paranoid Ideation and Rumination  Suicidal Thoughts:  No  Homicidal Thoughts:  No  Memory:  Immediate;   Fair Recent;   Fair Remote;   Fair  Judgement:  Impaired  Insight:  Shallow  Psychomotor Activity:  Increased  Concentration:   Concentration: Fair  Recall:  [employer] of Knowledge:  Fair  Language:  Fair  Akathisia:  No  Handed:  Right  AIMS (if indicated):     Assets:  Desire for Improvement Resilience  ADL's:  Intact  Cognition:  Impaired,  Mild  Sleep:  Number of Hours: 4.25     Treatment Plan Summary: Daily contact with patient to assess and evaluate symptoms and progress in treatment, Medication management and Plan I reviewed with the patient that she is still having manic symptoms.  These are obvious to everyone except her.  Patient of course has no insight.  I am increasing her lithium and also after reviewing her medicine will discontinue the trazodone and Zoloft because they may be potentiating the mania.  I am going to add Ambien to help with the sleep at night.  All of these changes reviewed with the patient.  As of course noted her insight is limited.  Continue trying with psychoeducation and supportive counseling.  Mordecai Rasmussen, MD 11/07/2018, 4:57 PM

## 2018-11-07 NOTE — Progress Notes (Signed)
Patient has had multiple crying spells throughout the day stating to this writer that it's not fair that she can be held here and she feels like the MD is withholding her from getting medical treatment on her right knee. Patient also came up to this writer pushing on her right leg stating "I'm retaining fluids, look at this" pressing on her right leg. This Clinical research associate informed MD of patient's complaints.

## 2018-11-07 NOTE — [facility] Group Notes (Signed)
LCSW Group Therapy Note  11/07/2018 12:28 PM  Type of Therapy/Topic:  Group Therapy:  Emotion Regulation  Participation Level:  Did Not Attend   Description of Group:   The purpose of this group is to assist patients in learning to regulate negative emotions and experience positive emotions. Patients will be guided to discuss ways in which they have been vulnerable to their negative emotions. These vulnerabilities will be juxtaposed with experiences of positive emotions or situations, and patients will be challenged to use positive emotions to combat negative ones. Special emphasis will be placed on coping with negative emotions in conflict situations, and patients will process healthy conflict resolution skills.  Therapeutic Goals: 1. Patient will identify two positive emotions or experiences to reflect on in order to balance out negative emotions 2. Patient will label two or more emotions that they find the most difficult to experience 3. Patient will demonstrate positive conflict resolution skills through discussion and/or role plays  Summary of Patient Progress: x   Therapeutic Modalities:   Cognitive Behavioral Therapy Feelings Identification Dialectical Behavioral Therapy   Tamari Busic, MSW, LCSW Clinical Social Work 11/07/2018 12:28 PM    

## 2018-11-07 NOTE — Progress Notes (Signed)
Recreation Therapy Notes    Date: 11/07/2018  Time: 9:30 am  Location: Craft room  Behavioral response: Hyperverbal, Anxious, Restless, Tangential   Intervention Topic: Wellness  Discussion/Intervention:  Group content on today was focused on anger management. The group defined anger and reasons they become angry. Individuals expressed negative way they have dealt with anger in the past. Patients stated some positive ways they could deal with anger in the future. The group described how anger can affect your health and daily plans. Individuals participated in the intervention "Score your anger" where they had a chance to answer questions about themselves and get a score of their anger.   Clinical Observations/Feedback:  Patient came to group and explained wellness is feeling good and taking medication. She walked in and out of group several times due to unknown reasons. Participant could not focus on topic and had to be redirected a few times.  Jonne Rote LRT/CTRS         Samad Thon 11/07/2018 11:09 AM

## 2018-11-08 MED ORDER — CHLORHEXIDINE GLUCONATE 0.12% ORAL RINSE (MEDLINE KIT)
15.0000 mL | Freq: Two times a day (BID) | OROMUCOSAL | Status: DC
  Administered 2018-11-08 – 2018-11-12 (×6): 15 mL via OROMUCOSAL

## 2018-11-08 NOTE — B[facility] Counselor (Signed)
CSW called Thalia Bloodgood, [phone], and informed that patient is declining CST referral at this time.  Misty Stanley responded ok and that pt can call her at discharge to follow up.   Penni Homans, MSW, LCSW 11/08/2018 10:26 AM

## 2018-11-08 NOTE — Progress Notes (Signed)
D - Patient was in the day room upon arrival.Patient was pleasant this evening. Patient was hyperactive but there were not outbursts from her like she had last week. Patient presents with a hyperactive affect. Patient also presented with flight of ideas and tangential speech. Patient also had pressured speech.   A - Patient compliant with medication administration per MD orders. Patient given education. Patient given support and encouragement to be active in her treatment plan. Patient informed to let staff know if there are any issues or problems on the unit.   R - Patient being monitored Q 15 minutes for safety per unit protocol. Patient remains safe on the unit.

## 2018-11-08 NOTE — Progress Notes (Signed)
[facility]  11/08/2018 1:37 PM Kelly Carr  MRN:  [id number] Subjective: Patient seen chart reviewed.  Patient came in to talk with me today and once again displays hyper verbality grandiosity disorganized thinking.  However she did not lose her temper with me and was a little bit redirectable.  She continues to be hyperactive and sometimes a problem with her behavior on the ward but she is taking care of her ADLs okay.  Her insight is still limited.  She does however seem to be taking her medicine well.  She was complaining today of swelling in her right leg which is very minimal.  No redness seen.  No swelling anywhere else Principal Problem: Bipolar disorder with severe mania (HCC) Diagnosis: Principal Problem:   Bipolar disorder with severe mania (HCC) Active Problems:   GERD (gastroesophageal reflux disease)   Noncompliance   Cannabis abuse  Total Time spent with patient: 30 minutes  Past Psychiatric History: History of bipolar disorder with current episode apparently going on for months  Past Medical History:  Past Medical History:  Diagnosis Date  . Abnormal Pap smear    Unknown results>colpo>normal  . Anxiety   . Arthritis   . Asthma   . Bipolar 1 disorder (HCC)   . Depression     Past Surgical History:  Procedure Laterality Date  . NO PAST SURGERIES     Family History:  Family History  Problem Relation Age of Onset  . Diabetes Mother   . Hypertension Mother   . Heart disease Mother 248  . Schizophrenia Mother   . Diabetes Maternal Grandmother   . Heart disease Maternal Grandmother   . Diabetes Maternal Grandfather   . Heart disease Maternal Grandfather   . Bipolar disorder Cousin   . Bipolar disorder Nephew   . Depression Daughter    Family Psychiatric  History: See previous.  Probably positive for bipolar disorder in the family Social History:  Social History   Substance and Sexual Activity  Alcohol Use Yes   Comment: "a lot"     Social  History   Substance and Sexual Activity  Drug Use Not Currently    Social History   Socioeconomic History  . Marital status: Single    Spouse name: Not on file  . Number of children: Not on file  . Years of education: Not on file  . Highest education level: Not on file  Occupational History  . Not on file  Social Needs  . Financial resource strain: Not on file  . Food insecurity:    Worry: Not on file    Inability: Not on file  . Transportation needs:    Medical: Not on file    Non-medical: Not on file  Tobacco Use  . Smoking status: Current Every Day Smoker    Packs/day: 1.50    Types: Cigarettes, E-cigarettes  . Smokeless tobacco: Never Used  Substance and Sexual Activity  . Alcohol use: Yes    Comment: "a lot"  . Drug use: Not Currently  . Sexual activity: Yes    Partners: Male    Birth control/protection: None  Lifestyle  . Physical activity:    Days per week: Not on file    Minutes per session: Not on file  . Stress: Not on file  Relationships  . Social connections:    Talks on phone: Not on file    Gets together: Not on file    Attends religious service: Not on file  Active member of club or organization: Not on file    Attends meetings of clubs or organizations: Not on file    Relationship status: Not on file  Other Topics Concern  . Not on file  Social History Narrative  . Not on file   Additional Social History:                         Sleep: Fair  Appetite:  Poor  Current Medications: Current Facility-Administered Medications  Medication Dose Route Frequency Provider Last Rate Last Dose  . acetaminophen (TYLENOL) tablet 650 mg  650 mg Oral Q6H PRN Mariel Craft, MD   650 mg at 11/07/18 2246  . alum & mag hydroxide-simeth (MAALOX/MYLANTA) 200-200-20 MG/5ML suspension 30 mL  30 mL Oral Q4H PRN Mariel Craft, MD      . ARIPiprazole (ABILIFY) tablet 30 mg  30 mg Oral Daily Burdell Peed, [name], MD   30 mg at 11/08/18 0801  .  ARIPiprazole ER (ABILIFY MAINTENA) injection 400 mg  400 mg Intramuscular Q28 days Khyree Carillo, [name], MD   400 mg at 11/01/18 1300  . chlorhexidine (PERIDEX) 0.12 % solution 15 mL  15 mL Mouth/Throat BID Hadlee Burback T, MD      . hydrocerin (EUCERIN) cream   Topical BID Kristan Brummitt T, MD      . hydrOXYzine (ATARAX/VISTARIL) tablet 50 mg  50 mg Oral Q6H PRN Mariel Craft, MD   50 mg at 11/07/18 2246  . lithium carbonate (ESKALITH) CR tablet 900 mg  900 mg Oral QHS Mckaila Duffus T, MD   900 mg at 11/07/18 2246  . magnesium hydroxide (MILK OF MAGNESIA) suspension 30 mL  30 mL Oral Daily PRN Mariel Craft, MD   30 mL at 11/02/18 1125  . meloxicam (MOBIC) tablet 15 mg  15 mg Oral Daily Kaushal Vannice, [name], MD   15 mg at 11/08/18 0759  . nicotine (NICODERM CQ - dosed in mg/24 hours) patch 21 mg  21 mg Transdermal Daily Pinkie Manger, [name], MD   21 mg at 11/08/18 0801  . OLANZapine zydis (ZYPREXA) disintegrating tablet 10 mg  10 mg Oral Q8H PRN Mariel Craft, MD   10 mg at 11/06/18 2243  . pantoprazole (PROTONIX) EC tablet 40 mg  40 mg Oral Daily Mariel Craft, MD   40 mg at 11/08/18 0802  . traZODone (DESYREL) tablet 100 mg  100 mg Oral QHS Mariel Craft, MD   100 mg at 11/07/18 2246  . ziprasidone (GEODON) injection 20 mg  20 mg Intramuscular BID PRN Akyah Lagrange, [name], MD   20 mg at 11/07/18 0920  . zolpidem (AMBIEN) tablet 5 mg  5 mg Oral QHS Dannel Rafter, [name], MD   5 mg at 11/07/18 2246    Lab Results: No results found for this or any previous visit (from the past 48 hour(s)).  Blood Alcohol level:  Lab Results  Component Value Date   ETH <10 10/30/2018   ETH <10 10/15/2018    Metabolic Disorder Labs: Lab Results  Component Value Date   HGBA1C 5.0 10/09/2018   MPG 96.8 10/09/2018   MPG 96.8 03/21/2018   Lab Results  Component Value Date   PROLACTIN 39.5 (H) 04/12/2018   PROLACTIN 2.0 12/05/2011   Lab Results  Component Value Date   CHOL 215 (H) 10/09/2018   TRIG 83 10/09/2018    HDL 85 10/09/2018   CHOLHDL 2.5  10/09/2018   VLDL 17 10/09/2018   LDLCALC 113 (H) 10/09/2018   LDLCALC 118 (H) 03/21/2018    Physical Findings: AIMS:  , ,  ,  ,    CIWA:    COWS:     Musculoskeletal: Strength & Muscle Tone: within normal limits Gait & Station: normal Patient leans: N/A  Psychiatric Specialty Exam: Physical Exam  Nursing note and vitals reviewed. Constitutional: She appears well-developed and well-nourished.  HENT:  Head: Normocephalic and atraumatic.  Eyes: Pupils are equal, round, and reactive to light. Conjunctivae are normal.  Neck: Normal range of motion.  Cardiovascular: Regular rhythm and normal heart sounds.  Respiratory: Effort normal.  GI: Soft.  Musculoskeletal: Normal range of motion.  Neurological: She is alert.  Skin: Skin is warm and dry.  Psychiatric: Her affect is labile and inappropriate. Her speech is rapid and/or pressured and tangential. She is agitated. Thought content is paranoid. Cognition and memory are impaired. She expresses impulsivity and inappropriate judgment.    Review of Systems  Constitutional: Negative.   HENT: Negative.   Eyes: Negative.   Respiratory: Negative.   Cardiovascular: Negative.   Gastrointestinal: Negative.   Musculoskeletal: Negative.   Skin: Negative.   Neurological: Negative.   Psychiatric/Behavioral: Negative.     Blood pressure 135/73, pulse 92, temperature 98.2 F (36.8 C), temperature source Oral, resp. rate 16, height 5\' 5"  (1.651 m), weight 72.6 kg, last menstrual period 10/30/2018, SpO2 98 %.Body mass index is 26.63 kg/m.  General Appearance: Casual  Eye Contact:  Fair  Speech:  Pressured  Volume:  Increased  Mood:  Euphoric and Irritable  Affect:  Congruent  Thought Process:  Disorganized  Orientation:  Full (Time, Place, and Person)  Thought Content:  Illogical and Paranoid Ideation  Suicidal Thoughts:  No  Homicidal Thoughts:  No  Memory:  Immediate;   Fair Recent;    Fair Remote;   Fair  Judgement:  Fair  Insight:  Shallow  Psychomotor Activity:  Increased  Concentration:  Concentration: Poor  Recall:  Poor  Fund of Knowledge:  Fair  Language:  Fair  Akathisia:  No  Handed:  Right  AIMS (if indicated):     Assets:  Desire for Improvement Physical Health Resilience  ADL's:  Impaired  Cognition:  Impaired,  Mild  Sleep:  Number of Hours: 5.5     Treatment Plan Summary: Daily contact with patient to assess and evaluate symptoms and progress in treatment, Medication management and Plan Appears to be tolerating medicine at higher doses.  Politely listened to her tried to respond to some of her questions.  She is complaining of some sore in her mouth that supposedly needed an antiseptic mouthwash.  Not clear how realistic this is but it seems like a harmless endeavor and so I will go ahead and give it to her.  No other change to medicine today.  Tried to give the patient some hope that things would be getting better although it may take a while.  Still pretty manic.  Mordecai Rasmussen, MD 11/08/2018, 1:37 PM

## 2018-11-08 NOTE — [facility] Group Notes (Signed)
Balance In Life 11/08/2018 1PM  Type of Therapy/Topic:  Group Therapy:  Balance in Life  Participation Level:  Active  Description of Group:   This group will address the concept of balance and how it feels and looks when one is unbalanced. Patients will be encouraged to process areas in their lives that are out of balance and identify reasons for remaining unbalanced. Facilitators will guide patients in utilizing problem-solving interventions to address and correct the stressor making their life unbalanced. Understanding and applying boundaries will be explored and addressed for obtaining and maintaining a balanced life. Patients will be encouraged to explore ways to assertively make their unbalanced needs known to significant others in their lives, using other group members and facilitator for support and feedback.  Therapeutic Goals: 1. Patient will identify two or more emotions or situations they have that consume much of in their lives. 2. Patient will identify signs/triggers that life has become out of balance:  3. Patient will identify two ways to set boundaries in order to achieve balance in their lives:  4. Patient will demonstrate ability to communicate their needs through discussion and/or role plays  Summary of Patient Progress: Pt had to be redirected numerous times when she veered off topic. Pt discussed unhealthy relationship she has with her family members and identified this as an area in which she needs to find balance. Pt was receptive to receiving feedback from group members.   Therapeutic Modalities:   Cognitive Behavioral Therapy Solution-Focused Therapy Assertiveness Training  Shalon Salado Philip Aspen, LCSW

## 2018-11-08 NOTE — Progress Notes (Signed)
Recreation Therapy Notes   Date: 11/08/2018  Time: 9:30 am  Location: Craft Room  Behavioral response: Appropriate  Intervention Topic: Goals  Discussion/Intervention:  Group content on today was focused on goals. Patients described what goals are and how they define goals. Individuals expressed how they go about setting goals and reaching them. The group identified how important goals are and if they make short term goals to reach long term goals. Patients described how many goals they work on at a time and what affects them not reaching their goal. Individuals described how much time they put into planning and obtaining their goals. The group participated in the intervention "My Goal Board" and made personal goal boards to help them achieve their goal. Clinical Observations/Feedback:  Patient came to group and defined goals as things to be accomplished and work towards. She explained that she works on goals by starting small and working toward something bigger. Participant expressed that her goal is to get back in school and graduate. Individual was social with peers and staff while participating in the intervention. Latishia Suitt LRT/CTRS           Thi Sisemore 11/08/2018 1:07 PM

## 2018-11-08 NOTE — Plan of Care (Signed)
Pt alert and oriented x 4. Pt is labile and was upset by a phone call this evening. Pt stated her mother was upsetting her. Pt also got visibly upset when she spoke about her daughter.Both times pt was able to calm herself down by talking to this RN. Pt denies SI, HI, and AVH. Pt is hyper-verbal and very active on unit. Pt needed minimal redirection for being intrusive with other patients, but was responsive to redirection. Pt was appropriate in milieu. Pt took medications as reflected in MAR. Pt refused Ambien and stated she does not take it outside hospital and only wanted her trazodone.Pt received PRN medication for knee pain. Pt stated this was affective. Pt cooperative. No distress noted or needs expressed. Will continue to monitor.

## 2018-11-08 NOTE — BHH Counselor (Signed)
CSW met with the patient to discuss conversation with the patients EAP therapist, [name], who reports that she recommends that the patient be referred to CST due to the increase in hospitalizations in the past 30 days. Patient expressed confusion and appeared irritated as the patient had said that Lattie Haw would make any referrals, which was different than anything that Lattie Haw had said to this CSW.  CSW and patient called Lattie Haw for clarification.  Lattie Haw stated that she would not make the referral and that she was "not allowed to since I haven't seen you in 30 days".  CSW spoke with Lattie Haw one on one and expressed concern for patient not signing release for CST referral as requested, despite the patient reporting that she was open to it during the phone conference.    Once CSW attempted to return to the patient, the patient was in the hallway talking aggressively with a nurse and MHT.  CSW waited until the conversation appeared to be deescalated before approaching, in an effort to not escalate the situation further by having too many people involved.   Once CSW spoke with the patient, the patient became loud, angry and aggressive and was upset that CSW spoke with Lattie Haw one on one.  CSW pointed out that a release had been signed and before stepping out the room, CSW asked pt if it was okay and pt provided permission.    Patient remained upset and CSW was unable to deescalate.    CSW notes that the patient mumbled "b*tch" under her breath several times under her breath and gave the CSW the middle finger throughout the day following this interaction.  Pt did approach CSW later and apologized for yelling, name calling and middle fingers, however, resumed the behavior shortly after becoming upset at this CSW again when I again began to discuss CST.   Patient is declining to sign for CST referral at this time.   Assunta Curtis, MSW, LCSW 11/07/2018 10:25 AM

## 2018-11-08 NOTE — Plan of Care (Signed)
Patient said she had a really bad day. When assessed about her day patient had flight of ideas with pressured speech.   Problem: Self-Concept: Goal: Level of anxiety will decrease Outcome: Not Progressing

## 2018-11-08 NOTE — Plan of Care (Signed)
Patient is alert and oriented, can be very labile, has moments of rapid and pressured speech, very talkative, jumps form topic to topic. Patient is hyper vigilant around the nurses station. Patient intervenes in other patients problems or situations. Patient must be redirected on rules and regulation on BMU. Patient is easily angered when not given way. Patient calls staff members "Bitches" when not given way. Safety checks to continue Q 15 minutes. Problem: Education: Goal: Ability to state activities that reduce stress will improve Outcome: Progressing   Problem: Self-Concept: Goal: Ability to identify factors that promote anxiety will improve Outcome: Progressing Goal: Level of anxiety will decrease Outcome: Progressing Goal: Ability to modify response to factors that promote anxiety will improve Outcome: Progressing

## 2018-11-08 NOTE — Plan of Care (Signed)
  Problem: Education: Goal: Ability to state activities that reduce stress will improve 11/08/2018 2347 by Gaetana Michaelis, RN Outcome: Progressing 11/08/2018 2341 by Gaetana Michaelis, RN Outcome: Progressing   Problem: Self-Concept: Goal: Ability to identify factors that promote anxiety will improve 11/08/2018 2347 by Gaetana Michaelis, RN Outcome: Progressing 11/08/2018 2341 by Gaetana Michaelis, RN Outcome: Progressing Goal: Level of anxiety will decrease 11/08/2018 2347 by Gaetana Michaelis, RN Outcome: Progressing 11/08/2018 2341 by Gaetana Michaelis, RN Outcome: Progressing Goal: Ability to modify response to factors that promote anxiety will improve 11/08/2018 2347 by Gaetana Michaelis, RN Outcome: Progressing 11/08/2018 2341 by Gaetana Michaelis, RN Outcome: Progressing   Problem: Activity: Goal: Interest or engagement in leisure activities will improve 11/08/2018 2347 by Gaetana Michaelis, RN Outcome: Progressing 11/08/2018 2341 by Gaetana Michaelis, RN Outcome: Progressing Goal: Imbalance in normal sleep/wake cycle will improve 11/08/2018 2347 by Gaetana Michaelis, RN Outcome: Progressing 11/08/2018 2341 by Gaetana Michaelis, RN Outcome: Progressing   Problem: Coping: Goal: Coping ability will improve 11/08/2018 2347 by Gaetana Michaelis, RN Outcome: Progressing 11/08/2018 2341 by Gaetana Michaelis, RN Outcome: Progressing Goal: Will verbalize feelings 11/08/2018 2347 by Gaetana Michaelis, RN Outcome: Progressing 11/08/2018 2341 by Gaetana Michaelis, RN Outcome: Progressing   Problem: Health Behavior/Discharge Planning: Goal: Ability to make decisions will improve 11/08/2018 2347 by Gaetana Michaelis, RN Outcome: Progressing 11/08/2018 2341 by Gaetana Michaelis, RN Outcome: Progressing Goal: Compliance with therapeutic regimen will improve 11/08/2018 2347 by Gaetana Michaelis, RN Outcome: Progressing 11/08/2018 2341 by Gaetana Michaelis, RN Outcome: Progressing   Problem:  Safety: Goal: Ability to disclose and discuss suicidal ideas will improve 11/08/2018 2347 by Gaetana Michaelis, RN Outcome: Progressing 11/08/2018 2341 by Gaetana Michaelis, RN Outcome: Progressing Goal: Ability to identify and utilize support systems that promote safety will improve 11/08/2018 2347 by Gaetana Michaelis, RN Outcome: Progressing 11/08/2018 2341 by Gaetana Michaelis, RN Outcome: Progressing   Problem: Education: Goal: Will be free of psychotic symptoms 11/08/2018 2347 by Gaetana Michaelis, RN Outcome: Progressing 11/08/2018 2341 by Gaetana Michaelis, RN Outcome: Progressing Goal: Knowledge of the prescribed therapeutic regimen will improve 11/08/2018 2347 by Gaetana Michaelis, RN Outcome: Progressing 11/08/2018 2341 by Gaetana Michaelis, RN Outcome: Progressing   Problem: Health Behavior/Discharge Planning: Goal: Compliance with prescribed medication regimen will improve 11/08/2018 2347 by Gaetana Michaelis, RN Outcome: Progressing 11/08/2018 2341 by Gaetana Michaelis, RN Outcome: Progressing   Problem: Self-Care: Goal: Ability to participate in self-care as condition permits will improve 11/08/2018 2347 by Gaetana Michaelis, RN Outcome: Progressing 11/08/2018 2341 by Gaetana Michaelis, RN Outcome: Progressing

## 2018-11-09 ENCOUNTER — Ambulatory Visit: Payer: Self-pay | Admitting: Physician Assistant

## 2018-11-09 NOTE — BHH Group Notes (Signed)
CSW met with patient who requested that her family no longer be contacted.  Patient reported that she was upset and crying because Dr. Weber Cooks has not let her go home.  Patient reports that she wants to go home to care for her cat, pay bills, talk with her landlord and make her court dates. She appeared to be in a manic state. Speech was rapid and tangential.  Patient redirected herself often.    Assunta Curtis, MSW, LCSW 11/09/2018 3:41 PM

## 2018-11-09 NOTE — Progress Notes (Signed)
Recreation Therapy Notes   Date: 11/09/2018  Time: 9:30 am  Location: Outside  Behavioral response: Appropriate  Intervention Topic: Relaxation  Discussion/Intervention:  Group content today was focused on relaxation. The group defined relaxation and identified healthy ways to relax. Individuals expressed how much time they spend relaxing. Patients expressed how much their life would be if they did not make time for themselves to relax. The group stated ways they could improve their relaxation techniques in the future.  Individuals participated in the intervention "Time to Relax" where they had a chance to experience different relaxation techniques.  Clinical Observations/Feedback:  Patient came to group and defined relaxation as being outside in nature. She stated that things she does to relax are taking a bubble bath, deep breathing and listening to music. Participant expressed that she would participate in guided imagery again in the future. Individual was social with peers and staff while participating in the intervention. Yurani Fettes LRT/CTRS         Tavita Eastham 11/09/2018 11:23 AM

## 2018-11-09 NOTE — BHH Group Notes (Signed)
LCSW Group Therapy Note  11/09/2018 1:00 PM  Type of Therapy and Topic:  Group Therapy:  Feelings around Relapse and Recovery  Participation Level:  Minimal   Description of Group:    Patients in this group will discuss emotions they experience before and after a relapse. They will process how experiencing these feelings, or avoidance of experiencing them, relates to having a relapse. Facilitator will guide patients to explore emotions they have related to recovery. Patients will be encouraged to process which emotions are more powerful. They will be guided to discuss the emotional reaction significant others in their lives may have to their relapse or recovery. Patients will be assisted in exploring ways to respond to the emotions of others without this contributing to a relapse.  Therapeutic Goals: 1. Patient will identify two or more emotions that lead to a relapse for them 2. Patient will identify two emotions that result when they relapse 3. Patient will identify two emotions related to recovery 4. Patient will demonstrate ability to communicate their needs through discussion and/or role plays   Summary of Patient Progress: Patient attended group, however, left shortly after it started crying. Patient did not return.   Therapeutic Modalities:   Cognitive Behavioral Therapy Solution-Focused Therapy Assertiveness Training Relapse Prevention Therapy   Penni Homans, MSW, LCSW 11/09/2018 12:48 PM

## 2018-11-09 NOTE — Progress Notes (Signed)
[facility] MD Progress Note  11/09/2018 4:55 PM Kelly Carr  MRN:  [id number] Subjective: Follow-up patient with bipolar disorder.  Patient has been in a manic episode for months now.  She has had several hospitalizations back to back.  Most of them short because she can hold it together adequately for short periods of time in the hospital but outside the hospital reverts to manic and frequently psychotic behavior.  Patient came to see me today and as usual talk nonstop for quite a while rambling all over the place.  Some grandiosity.  It became clear that once again she was either convince she was being discharged or was trying to convince me of that.  I told her that I was glad that she had plans for the future but I was still not planning to discharge her today at which point as I expected she became extremely angry shouting stomping around the room throwing pieces of paper.  I have a lithium level ordered for tomorrow Principal Problem: Bipolar disorder with severe mania ([place]) Diagnosis: Principal Problem:   Bipolar disorder with severe mania ([place]) Active Problems:   GERD (gastroesophageal reflux disease)   Noncompliance   Cannabis abuse  Total Time spent with patient: 30 minutes  Past Psychiatric History: Past history of bipolar disorder which evidently had been quiesced sent for a long period of time but now seems to be in an extended period of mania  Past Medical History:  Past Medical History:  Diagnosis Date  . Abnormal Pap smear    Unknown results>colpo>normal  . Anxiety   . Arthritis   . Asthma   . Bipolar 1 disorder ([place])   . Depression     Past Surgical History:  Procedure Laterality Date  . NO PAST SURGERIES     Family History:  Family History  Problem Relation Age of Onset  . Diabetes Mother   . Hypertension Mother   . Heart disease Mother 37  . Schizophrenia Mother   . Diabetes Maternal Grandmother   . Heart disease Maternal Grandmother   . Diabetes Maternal  Grandfather   . Heart disease Maternal Grandfather   . Bipolar disorder Cousin   . Bipolar disorder Nephew   . Depression Daughter    Family Psychiatric  History: Positive for bipolar disorder in close relatives Social History:  Social History   Substance and Sexual Activity  Alcohol Use Yes   Comment: "a lot"     Social History   Substance and Sexual Activity  Drug Use Not Currently    Social History   Socioeconomic History  . Marital status: Single    Spouse name: Not on file  . Number of children: Not on file  . Years of education: Not on file  . Highest education level: Not on file  Occupational History  . Not on file  Social Needs  . Financial resource strain: Not on file  . Food insecurity:    Worry: Not on file    Inability: Not on file  . Transportation needs:    Medical: Not on file    Non-medical: Not on file  Tobacco Use  . Smoking status: Current Every Day Smoker    Packs/day: 1.50    Types: Cigarettes, E-cigarettes  . Smokeless tobacco: Never Used  Substance and Sexual Activity  . Alcohol use: Yes    Comment: "a lot"  . Drug use: Not Currently  . Sexual activity: Yes    Partners: Male    Birth control/protection:  None  Lifestyle  . Physical activity:    Days per week: Not on file    Minutes per session: Not on file  . Stress: Not on file  Relationships  . Social connections:    Talks on phone: Not on file    Gets together: Not on file    Attends religious service: Not on file    Active member of club or organization: Not on file    Attends meetings of clubs or organizations: Not on file    Relationship status: Not on file  Other Topics Concern  . Not on file  Social History Narrative  . Not on file   Additional Social History:                         Sleep: Fair  Appetite:  Fair  Current Medications: Current Facility-Administered Medications  Medication Dose Route Frequency Provider Last Rate Last Dose  .  acetaminophen (TYLENOL) tablet 650 mg  650 mg Oral Q6H PRN Lavella Hammock, MD   650 mg at 11/09/18 1039  . alum & mag hydroxide-simeth (MAALOX/MYLANTA) 200-200-20 MG/5ML suspension 30 mL  30 mL Oral Q4H PRN Lavella Hammock, MD      . ARIPiprazole (ABILIFY) tablet 30 mg  30 mg Oral Daily [name], Madie Reno, MD   30 mg at 11/09/18 0753  . ARIPiprazole ER (ABILIFY MAINTENA) injection 400 mg  400 mg Intramuscular Q28 days [name], Madie Reno, MD   400 mg at 11/01/18 1300  . chlorhexidine gluconate (MEDLINE KIT) (PERIDEX) 0.12 % solution 15 mL  15 mL Mouth/Throat BID [name], [name], MD   15 mL at 11/09/18 0913  . hydrocerin (EUCERIN) cream   Topical BID [name], [name], MD      . hydrOXYzine (ATARAX/VISTARIL) tablet 50 mg  50 mg Oral Q6H PRN Lavella Hammock, MD   50 mg at 11/09/18 1339  . lithium carbonate (ESKALITH) CR tablet 900 mg  900 mg Oral QHS [name], Madie Reno, MD   900 mg at 11/08/18 2235  . magnesium hydroxide (MILK OF MAGNESIA) suspension 30 mL  30 mL Oral Daily PRN Lavella Hammock, MD   30 mL at 11/02/18 1125  . meloxicam (MOBIC) tablet 15 mg  15 mg Oral Daily [name], Madie Reno, MD   15 mg at 11/09/18 0753  . nicotine (NICODERM CQ - dosed in mg/24 hours) patch 21 mg  21 mg Transdermal Daily [name], Madie Reno, MD   21 mg at 11/09/18 0753  . OLANZapine zydis (ZYPREXA) disintegrating tablet 10 mg  10 mg Oral Q8H PRN Lavella Hammock, MD   10 mg at 11/09/18 1439  . pantoprazole (PROTONIX) EC tablet 40 mg  40 mg Oral Daily Lavella Hammock, MD   40 mg at 11/09/18 0757  . traZODone (DESYREL) tablet 100 mg  100 mg Oral QHS Lavella Hammock, MD   100 mg at 11/08/18 2235  . ziprasidone (GEODON) injection 20 mg  20 mg Intramuscular BID PRN [name], Madie Reno, MD   20 mg at 11/07/18 0920  . zolpidem (AMBIEN) tablet 5 mg  5 mg Oral QHS [name], Madie Reno, MD   5 mg at 11/07/18 2246    Lab Results: No results found for this or any previous visit (from the past 48 hour(s)).  Blood Alcohol level:  Lab Results   Component Value Date   ETH <10 10/30/2018   ETH <10 10/15/2018  Metabolic Disorder Labs: Lab Results  Component Value Date   HGBA1C 5.0 10/09/2018   MPG 96.8 10/09/2018   MPG 96.8 03/21/2018   Lab Results  Component Value Date   PROLACTIN 39.5 (H) 04/12/2018   PROLACTIN 2.0 12/05/2011   Lab Results  Component Value Date   CHOL 215 (H) 10/09/2018   TRIG 83 10/09/2018   HDL 85 10/09/2018   CHOLHDL 2.5 10/09/2018   VLDL 17 10/09/2018   LDLCALC 113 (H) 10/09/2018   LDLCALC 118 (H) 03/21/2018    Physical Findings: AIMS:  , ,  ,  ,    CIWA:    COWS:     Musculoskeletal: Strength & Muscle Tone: within normal limits Gait & Station: normal Patient leans: N/A  Psychiatric Specialty Exam: Physical Exam  Nursing note and vitals reviewed. Constitutional: She appears well-developed and well-nourished.  HENT:  Head: Normocephalic and atraumatic.  Eyes: Pupils are equal, round, and reactive to light. Conjunctivae are normal.  Neck: Normal range of motion.  Cardiovascular: Regular rhythm and normal heart sounds.  Respiratory: Effort normal. No respiratory distress.  GI: Soft.  Musculoskeletal: Normal range of motion.       Feet:  Neurological: She is alert.  Skin: Skin is warm and dry.  Psychiatric: Her affect is labile. Her speech is rapid and/or pressured and tangential. She is agitated. Thought content is delusional. Cognition and memory are impaired. She expresses impulsivity.    Review of Systems  Constitutional: Negative.   HENT: Negative.   Eyes: Negative.   Respiratory: Negative.   Cardiovascular: Negative.   Gastrointestinal: Negative.   Musculoskeletal: Negative.   Skin: Negative.   Neurological: Negative.   Psychiatric/Behavioral: Negative for depression, hallucinations, memory loss, substance abuse and suicidal ideas. The patient is nervous/anxious and has insomnia.     Blood pressure (!) 147/77, pulse 94, temperature 98.3 F (36.8 C), temperature  source Oral, resp. rate 18, height '5\' 5"'  (1.651 m), weight 72.6 kg, last menstrual period 10/30/2018, SpO2 100 %.Body mass index is 26.63 kg/m.  General Appearance: Casual  Eye Contact:  Fair  Speech:  Pressured  Volume:  Increased  Mood:  Angry and Irritable  Affect:  Inappropriate and Labile  Thought Process:  Disorganized  Orientation:  Full (Time, Place, and Person)  Thought Content:  Illogical, Rumination and Tangential  Suicidal Thoughts:  No  Homicidal Thoughts:  No  Memory:  Immediate;   Fair Recent;   Fair Remote;   Fair  Judgement:  Impaired  Insight:  Shallow  Psychomotor Activity:  Restlessness  Concentration:  Concentration: Poor  Recall:  [employer] of Knowledge:  Fair  Language:  Fair  Akathisia:  No  Handed:  Right  AIMS (if indicated):     Assets:  Agricultural consultant Physical Health Resilience  ADL's:  Intact  Cognition:  WNL  Sleep:  Number of Hours: 6     Treatment Plan Summary: Daily contact with patient to assess and evaluate symptoms and progress in treatment, Medication management and Plan I think she is overall getting a little bit better.  Most of the day she is less demonstrative and hyperactive in her behavior.  When she comes to see me she loses her temper but calms down pretty quickly.  Did not need any kind of emergency as needed today.  I have put in an order to check her lithium level and her basic chemistry panel tomorrow morning.  She is complaining about the small amount of edema  she notices on her right ankle.  It is not bilateral so I doubt that it is from her medicine.  She also continues to complain of her knee although I see no redness and swelling in that area and she appears to be able to walk on it without difficulty.  I told her I did not think she needed an MRI of the knee today.  I did offer to provide TED hose for the swelling.  There is nothing red or inflamed about the leg so I do not think it is  likely to be a blood clot either.  Kelly Berthold, MD 11/09/2018, 4:55 PM

## 2018-11-09 NOTE — Plan of Care (Signed)
Patient is alert and oriented, patient has flight of ideas, Rapid pressured speech, and irritability. Patient attends groups, eating well and sleeping well. Patient shows interest and engages in leisure activities. Patient states, " I just want to stay positive, focus on my walks and breathing." Patient takes medications appropriately, and asks for medications when anxiety becomes overwhelming. Patient denies SI, HI and AVH. Safety checks Q 15 minutes. Problem: Education: Goal: Ability to state activities that reduce stress will improve Outcome: Not Progressing   Problem: Self-Concept: Goal: Ability to identify factors that promote anxiety will improve Outcome: Not Progressing Goal: Level of anxiety will decrease Outcome: Not Progressing Goal: Ability to modify response to factors that promote anxiety will improve Outcome: Not Progressing   Problem: Activity: Goal: Interest or engagement in leisure activities will improve Outcome: Not Progressing Goal: Imbalance in normal sleep/wake cycle will improve Outcome: Not Progressing   Problem: Activity: Goal: Interest or engagement in leisure activities will improve Outcome: Not Progressing Goal: Imbalance in normal sleep/wake cycle will improve Outcome: Not Progressing   Problem: Coping: Goal: Coping ability will improve Outcome: Not Progressing Goal: Will verbalize feelings Outcome: Not Progressing

## 2018-11-10 LAB — BASIC METABOLIC PANEL
Anion gap: 7 (ref 5–15)
BUN: 14 mg/dL (ref 6–20)
CO2: 27 mmol/L (ref 22–32)
Calcium: 9.1 mg/dL (ref 8.9–10.3)
Chloride: 107 mmol/L (ref 98–111)
Creatinine, Ser: 0.68 mg/dL (ref 0.44–1.00)
GFR calc Af Amer: >60 mL/min (ref >60)
GFR calc non Af Amer: >60 mL/min (ref >60)
Glucose, Bld: 106 mg/dL — ABNORMAL HIGH (ref 70–99)
Potassium: 4.7 mmol/L (ref 3.5–5.1)
Sodium: 141 mmol/L (ref 135–145)

## 2018-11-10 LAB — LITHIUM LEVEL: Lithium Lvl: 0.94 mmol/L (ref 0.60–1.20)

## 2018-11-10 MED ORDER — ADULT MULTIVITAMIN LIQUID CH
15.0000 mL | Freq: Every day | ORAL | Status: DC
  Administered 2018-11-11 – 2018-11-14 (×4): 15 mL via ORAL
  Filled 2018-11-10 (×5): qty 15

## 2018-11-10 MED ORDER — ZOLPIDEM TARTRATE 5 MG PO TABS
10.0000 mg | ORAL_TABLET | Freq: Every day | ORAL | Status: DC
  Administered 2018-11-10 – 2018-11-13 (×4): 10 mg via ORAL
  Filled 2018-11-10 (×4): qty 2

## 2018-11-10 NOTE — Plan of Care (Signed)
Patient is crying and threatening self harm and complaining of severe anxiety, and requesting a shot of Geodon IM, to calm self , patient is assessed and Geodon 20 mg  IM administered right gluteal area and tolerated well. Will reassess patient later.

## 2018-11-10 NOTE — Progress Notes (Signed)
Patient is calm  And resting in bed no further complains, medication is effective noted.

## 2018-11-10 NOTE — Plan of Care (Signed)
Patient is seeing in the community room socializing with peers, but has been concerned about her discharge, presenting in a distressed manner and expressing  discontent about staying here, coupled with angry phone calls she received from her family early in the day. Education and support is provided, patient verbally contract for safety and denies any SI/HI/AVH, and maintaining safety , 15 minutes checks is in progress.   Problem: Education: Goal: Ability to state activities that reduce stress will improve Outcome: Progressing   Problem: Self-Concept: Goal: Ability to identify factors that promote anxiety will improve Outcome: Progressing Goal: Level of anxiety will decrease Outcome: Progressing Goal: Ability to modify response to factors that promote anxiety will improve Outcome: Progressing   Problem: Activity: Goal: Interest or engagement in leisure activities will improve Outcome: Progressing Goal: Imbalance in normal sleep/wake cycle will improve Outcome: Progressing   Problem: Coping: Goal: Coping ability will improve Outcome: Progressing Goal: Will verbalize feelings Outcome: Progressing   Problem: Health Behavior/Discharge Planning: Goal: Ability to make decisions will improve Outcome: Progressing Goal: Compliance with therapeutic regimen will improve Outcome: Progressing   Problem: Safety: Goal: Ability to disclose and discuss suicidal ideas will improve Outcome: Progressing Goal: Ability to identify and utilize support systems that promote safety will improve Outcome: Progressing   Problem: Education: Goal: Will be free of psychotic symptoms Outcome: Progressing Goal: Knowledge of the prescribed therapeutic regimen will improve Outcome: Progressing   Problem: Health Behavior/Discharge Planning: Goal: Compliance with prescribed medication regimen will improve Outcome: Progressing   Problem: Self-Care: Goal: Ability to participate in self-care as condition  permits will improve Outcome: Progressing

## 2018-11-10 NOTE — Plan of Care (Signed)
Patient alert and oriented, able to verbalize feelings to staff and shows the ability to rationalize her anxiety and use calming techniques to redirect her frustrations and anxiety.

## 2018-11-10 NOTE — [facility] Group Notes (Signed)
LCSW Group Therapy Note  11/10/2018 1:15pm  Type of Therapy and Topic:  Group Therapy:  Healthy Self Image and Positive Change  Participation Level:  Active   Description of Group:  In this group, patients will compare and contrast their current "I am...." statements to the visions they identify as desirable for their lives.  Patients discuss fears and how they can make positive changes in their cognitions that will positively impact their behaviors.  Facilitator played a motivational 3-minute speech and patients were left with the task of thinking about what "I am...." statements they can start using in their lives immediately.  Therapeutic Goals: 1. Patient will state their current self-perception as expressed in an "I Am" statement 2. Patient will contrast this with their desired vision for their live 3. Patient will identify 3 fears that negatively impact their behavior 4. Patient will discuss cognitive distortions that stem from their fears 5. Patient will verbalize statements that challenge their cognitive distortions  Summary of Patient Progress:  The patient scored her mood at a 5(10 best.) The patient stated, "I am loyal" Patient discussed his fears and how she can make positive changes in their cognitions that will positively impact her behaviors. Patient was able to discuss and process cognitive distortions that stem from her  fears. Patient actively and appropriately engaged in the group. Patient was able to provide support and validation to other group members. Patient practiced active listening when interacting with the facilitator and other group members.     Therapeutic Modalities Cognitive Behavioral Therapy Motivational Interviewing  Shayne Deerman  [name], LCSW 11/10/2018 12:53 PM

## 2018-11-10 NOTE — Progress Notes (Signed)
Patient alert and oriented. Patient voiced no major complaints to this writer other than some anxiety. Patient denies SI/HI/AVH and pain at this time. Patient also denies any signs/symptoms of depression. Patient's goal for today is to take her medicine and attend groups so that she can go home soon. Took and tolerated scheduled medications administered to patient, without any adverse reactions noted. Support and encouragement provided.  Routine safety checks conducted every 15 minutes.  Patient informed to notify staff with problems or concerns.
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
## 2018-11-11 NOTE — Plan of Care (Signed)
Patient is alert and oriented , present in the milieu with a steady gait. Reports that she slept good last night without the use of sleep medication. Appetite is good,"Girl I be eating good here, I have gained some weight since I first came." Patient has some lower  bilateral pitting edema +1 noted. "It may be from my medication, that was one of the side effects of my blood pressure medicine." Patient became tearful on the telephone this afternoon when speaking to her mother. After she hung up the phone, patient informed nursing staff that she did not want to receive anymore calls from her mother. Patient denies SI/HI/AVH and pain at this time. Will continue to monitor.

## 2018-11-11 NOTE — Plan of Care (Addendum)
Pt. States she has been learning to meditate and walk away from things that are angering to her as coping skills. Pt. Reports overall reduction in her anxieties she experiences. Pt. Participation around the unit is mostly appropriate this evening with staff and peers. Pt. Needs direction and encouragement to take medications in a timely manor. Pt. Is mostly compliant with medications. Pt. Denies si/hi/avh, able to contract for safety. Pt. Continues to present with some pressured and rapid speech and labile mood, but overall seems to have improved some and is more redirectable during conversation.    Problem: Education: Goal: Ability to state activities that reduce stress will improve Outcome: Progressing   Problem: Self-Concept: Goal: Level of anxiety will decrease Outcome: Progressing   Problem: Activity: Goal: Interest or engagement in leisure activities will improve Outcome: Progressing   Problem: Coping: Goal: Coping ability will improve Outcome: Progressing Goal: Will verbalize feelings Outcome: Progressing   Problem: Health Behavior/Discharge Planning: Goal: Compliance with therapeutic regimen will improve Outcome: Progressing   Problem: Safety: Goal: Ability to disclose and discuss suicidal ideas will improve Outcome: Progressing   Problem: Education: Goal: Will be free of psychotic symptoms Outcome: Progressing   Problem: Health Behavior/Discharge Planning: Goal: Compliance with prescribed medication regimen will improve Outcome: Progressing

## 2018-11-11 NOTE — [facility] Group Notes (Signed)
LCSW Group Therapy Note 11/11/2018 1:15pm  Type of Therapy and Topic: Group Therapy: Feelings Around Returning Home & Establishing a Supportive Framework and Supporting Oneself When Supports Not Available  Participation Level: Minimal  Description of Group:  Patients first processed thoughts and feelings about upcoming discharge. These included fears of upcoming changes, lack of change, new living environments, judgements and expectations from others and overall stigma of mental health issues. The group then discussed the definition of a supportive framework, what that looks and feels like, and how do to discern it from an unhealthy non-supportive network. The group identified different types of supports as well as what to do when your family/friends are less than helpful or unavailable  Therapeutic Goals  1. Patient will identify one healthy supportive network that they can use at discharge. 2. Patient will identify one factor of a supportive framework and how to tell it from an unhealthy network. 3. Patient able to identify one coping skill to use when they do not have positive supports from others. 4. Patient will demonstrate ability to communicate their needs through discussion and/or role plays.  Summary of Patient Progress:  During the introduction,the patient reported that she was feeling "amazing" earlier today until she spoke with her mother. The patient started sharing with the group specific details about her past trauma, although the CSW tried to redirect her in several occassions, she refused to change subject, became upset and left the group. CSW explained to patients why it was important not to discuss personal details about past trauma in front of the group members.   Therapeutic Modalities Cognitive Behavioral Therapy Motivational Interviewing   Kelly Carr  [name], LCSW 11/11/2018 11:54 AM

## 2018-11-11 NOTE — Progress Notes (Signed)
D: Pt during assessments is tangential in her thought content. Pt. Speech is clear, but still pressured and rapid during conversation as the conversations continue. Pt. Mood is very labile overall. Pt. Requires redirection from working her self up and getting agitated, but more redirectable during conversations.   A: Q x 15 minute observation checks were completed for safety. Patient was provided with education. Patient was given/offered medications per orders. Patient  was encourage to attend groups, participate in unit activities and continue with plan of care. Pt. Chart and plans of care reviewed. Pt. Given support and encouragement.   R: Patient is complaint with medication and unit procedures with direction and encouragement. Pt. Given ted hose per MD orders and given patient education. Pt. Refuses ice per MD orders.             Precautionary checks every 15 minutes for safety maintained, room free of safety hazards, patient sustains no injury or falls during this shift. Will endorse care to next shift.

## 2018-11-11 NOTE — Progress Notes (Signed)
[facility] MD Progress Note  11/11/2018 8:32 AM Kelly Carr  MRN:  [id number] Subjective:   Patient seen for follow-up visit, she reports that she did sleep well last night with the Ambien escalation in dose.  Her lithium level was therapeutic.  She is now alert and fully oriented and generally cooperative very much lobbying for discharge, denies current thoughts of harming self or others. Does tend to ramble and the longer she is engaged in more pressured her speech become so again she is a bit self agitating but overall showing improvement day to day according to records. Principal Problem: Bipolar disorder with severe mania ([place]) Diagnosis: Principal Problem:   Bipolar disorder with severe mania ([place]) Active Problems:   GERD (gastroesophageal reflux disease)   Noncompliance   Cannabis abuse  Total Time spent with patient: 20 minutes  Past Psychiatric History: BPAD  Past Medical History:  Past Medical History:  Diagnosis Date  . Abnormal Pap smear    Unknown results>colpo>normal  . Anxiety   . Arthritis   . Asthma   . Bipolar 1 disorder ([place])   . Depression     Past Surgical History:  Procedure Laterality Date  . NO PAST SURGERIES     Family History:  Family History  Problem Relation Age of Onset  . Diabetes Mother   . Hypertension Mother   . Heart disease Mother 17  . Schizophrenia Mother   . Diabetes Maternal Grandmother   . Heart disease Maternal Grandmother   . Diabetes Maternal Grandfather   . Heart disease Maternal Grandfather   . Bipolar disorder Cousin   . Bipolar disorder Nephew   . Depression Daughter    Family Psychiatric  History: no new data Social History:  Social History   Substance and Sexual Activity  Alcohol Use Yes   Comment: "a lot"     Social History   Substance and Sexual Activity  Drug Use Not Currently    Social History   Socioeconomic History  . Marital status: Single    Spouse name: Not on file  . Number of children: Not on file   . Years of education: Not on file  . Highest education level: Not on file  Occupational History  . Not on file  Social Needs  . Financial resource strain: Not on file  . Food insecurity:    Worry: Not on file    Inability: Not on file  . Transportation needs:    Medical: Not on file    Non-medical: Not on file  Tobacco Use  . Smoking status: Current Every Day Smoker    Packs/day: 1.50    Types: Cigarettes, E-cigarettes  . Smokeless tobacco: Never Used  Substance and Sexual Activity  . Alcohol use: Yes    Comment: "a lot"  . Drug use: Not Currently  . Sexual activity: Yes    Partners: Male    Birth control/protection: None  Lifestyle  . Physical activity:    Days per week: Not on file    Minutes per session: Not on file  . Stress: Not on file  Relationships  . Social connections:    Talks on phone: Not on file    Gets together: Not on file    Attends religious service: Not on file    Active member of club or organization: Not on file    Attends meetings of clubs or organizations: Not on file    Relationship status: Not on file  Other Topics Concern  .  Not on file  Social History Narrative  . Not on file   Additional Social History:                         Sleep: Good  Appetite:  Good  Current Medications: Current Facility-Administered Medications  Medication Dose Route Frequency Provider Last Rate Last Dose  . acetaminophen (TYLENOL) tablet 650 mg  650 mg Oral Q6H PRN Lavella Hammock, MD   650 mg at 11/10/18 1516  . alum & mag hydroxide-simeth (MAALOX/MYLANTA) 200-200-20 MG/5ML suspension 30 mL  30 mL Oral Q4H PRN Lavella Hammock, MD      . ARIPiprazole (ABILIFY) tablet 30 mg  30 mg Oral Daily [name], Madie Reno, MD   30 mg at 11/11/18 6629  . ARIPiprazole ER (ABILIFY MAINTENA) injection 400 mg  400 mg Intramuscular Q28 days [name], Madie Reno, MD   400 mg at 11/01/18 1300  . chlorhexidine gluconate (MEDLINE KIT) (PERIDEX) 0.12 % solution 15 mL  15 mL  Mouth/Throat BID [name], [name], MD   15 mL at 11/11/18 0827  . hydrocerin (EUCERIN) cream   Topical BID [name], [name], MD      . hydrOXYzine (ATARAX/VISTARIL) tablet 50 mg  50 mg Oral Q6H PRN Lavella Hammock, MD   50 mg at 11/09/18 1339  . lithium carbonate (ESKALITH) CR tablet 900 mg  900 mg Oral QHS [name], [name], MD   900 mg at 11/10/18 2142  . magnesium hydroxide (MILK OF MAGNESIA) suspension 30 mL  30 mL Oral Daily PRN Lavella Hammock, MD   30 mL at 11/02/18 1125  . meloxicam (MOBIC) tablet 15 mg  15 mg Oral Daily [name], Madie Reno, MD   15 mg at 11/11/18 4765  . multivitamin liquid 15 mL  15 mL Oral Daily Johnn Hai, MD   15 mL at 11/11/18 0827  . nicotine (NICODERM CQ - dosed in mg/24 hours) patch 21 mg  21 mg Transdermal Daily [name], Madie Reno, MD   21 mg at 11/11/18 0829  . OLANZapine zydis (ZYPREXA) disintegrating tablet 10 mg  10 mg Oral Q8H PRN Lavella Hammock, MD   10 mg at 11/10/18 1515  . pantoprazole (PROTONIX) EC tablet 40 mg  40 mg Oral Daily Lavella Hammock, MD   40 mg at 11/11/18 4650  . traZODone (DESYREL) tablet 100 mg  100 mg Oral QHS Lavella Hammock, MD   100 mg at 11/10/18 2142  . ziprasidone (GEODON) injection 20 mg  20 mg Intramuscular BID PRN [name], Madie Reno, MD   20 mg at 11/10/18 0513  . zolpidem (AMBIEN) tablet 10 mg  10 mg Oral QHS Johnn Hai, MD   10 mg at 11/10/18 2142    Lab Results:  Results for orders placed or performed during the hospital encounter of 10/31/18 (from the past 48 hour(s))  Lithium level     Status: None   Collection Time: 11/10/18  6:45 AM  Result Value Ref Range   Lithium Lvl 0.94 0.60 - 1.20 mmol/L    Comment: Performed at [facility], [address] [place], [place] [address]  Basic metabolic panel     Status: Abnormal   Collection Time: 11/10/18  6:45 AM  Result Value Ref Range   Sodium 141 135 - 145 mmol/L   Potassium 4.7 3.5 - 5.1 mmol/L   Chloride 107 98 - 111 mmol/L   CO2 27 22 - 32  mmol/L   Glucose,  Bld 106 (H) 70 - 99 mg/dL   BUN 14 6 - 20 mg/dL   Creatinine, Ser 0.68 0.44 - 1.00 mg/dL   Calcium 9.1 8.9 - 10.3 mg/dL   GFR calc non Af Amer >60 >60 mL/min   GFR calc Af Amer >60 >60 mL/min   Anion gap 7 5 - 15    Comment: Performed at [facility], [address] [place], [place] [address]    Blood Alcohol level:  Lab Results  Component Value Date   [facility] <10 10/30/2018   ETH <10 14/97/0263    Metabolic Disorder Labs: Lab Results  Component Value Date   HGBA1C 5.0 10/09/2018   MPG 96.8 10/09/2018   MPG 96.8 03/21/2018   Lab Results  Component Value Date   PROLACTIN 39.5 (H) 04/12/2018   PROLACTIN 2.0 12/05/2011   Lab Results  Component Value Date   CHOL 215 (H) 10/09/2018   TRIG 83 10/09/2018   HDL 85 10/09/2018   CHOLHDL 2.5 10/09/2018   VLDL 17 10/09/2018   LDLCALC 113 (H) 10/09/2018   LDLCALC 118 (H) 03/21/2018    Physical Findings: AIMS:  , ,  ,  ,    CIWA:    COWS:     Musculoskeletal: Strength & Muscle Tone: within normal limits Gait & Station: normal Patient leans: N/A  Psychiatric Specialty Exam: Physical Exam  ROS  Blood pressure (!) 145/89, pulse 88, temperature 98.2 F (36.8 C), temperature source Oral, resp. rate 18, height '5\' 5"'  (1.651 m), weight 72.6 kg, last menstrual period 10/30/2018, SpO2 99 %.Body mass index is 26.63 kg/m.  General Appearance: Casual  Eye Contact:  Good  Speech:  Clear and Coherent and Pressured  Volume:  Normal  Mood:  hypomanic at intervals  Affect:  Appropriate  Thought Process:  Coherent, Linear and Descriptions of Associations: Intact  Orientation:  Full (Time, Place, and Person)  Thought Content:  Logical and Tangential  Suicidal Thoughts:  No  Homicidal Thoughts:  No  Memory:  Immediate;   Good  Judgement:  Good  Insight:  Good  Psychomotor Activity:  Normal  Concentration:  Concentration: Good  Recall:  Good  Fund of Knowledge:  Good  Language:  Good  Akathisia:  Negative  Handed:   Right  AIMS (if indicated):     Assets:  Leisure Time Physical Health Resilience  ADL's:  Intact  Cognition:  WNL  Sleep:  Number of Hours: 6     Treatment Plan Summary: Daily contact with patient to assess and evaluate symptoms and progress in treatment, Medication management and Plan Continue current milieu therapy cognitive-based therapy continue lithium and oral aripiprazole overlap with long-acting injectable aripiprazole  Johnn Hai, MD 11/11/2018, 8:32 AM

## 2018-11-12 MED ORDER — ACETAMINOPHEN 325 MG PO TABS
975.0000 mg | ORAL_TABLET | Freq: Three times a day (TID) | ORAL | Status: DC | PRN
  Administered 2018-11-12 – 2018-11-14 (×3): 975 mg via ORAL
  Filled 2018-11-12 (×3): qty 3

## 2018-11-12 NOTE — Progress Notes (Addendum)
Recreation Therapy Notes   Date: 11/12/2018  Time: 9:30 am  Location: Craft room  Behavioral response: Appropriate  Intervention Topic: Stress  Discussion/Intervention:  Group content on today was focused on stress. The group defined stress and way to cope with stress. Participants expressed how they know when they are stresses out. Individuals described the different ways they have to cope with stress. The group stated reasons why it is important to cope with stress. Patient explained what good stress is and some examples. The group participated in the intervention "Exploring stress". Individuals were separated into two group and answered questions related to stress.  Clinical Observations/Feedback:  Patient came to group and defined stress as a sense of being overwhelmed. She identified negative coping skills she normally uses are crying and being withdrawn. Participant explained some positive coping skills she can use in place of the negative ones are cooking, praying, singing and dancing. Patient expressed when she is stressed, she lacks sleep and is angry a lot. Individual was social with peers and staff while participating in the intervention. Teresia Myint LRT/CTRS         Raymond Bhardwaj 11/12/2018 12:13 PM

## 2018-11-12 NOTE — BHH Counselor (Signed)
CSW was stopped by the patient as CSW was checking in with another pt.  Pt reported that she needed help with paying her rent since daughter has told her that she has been given an eviction notice.  Patient made a point to mention that she is receiving a check from her employer.  Patient mentioned needing a doctors note for her landlord and CSW recommended patient ask Dr. Weber Cooks.  CSW informed patient that the approval for funds is not guaranteed but is a case by case basis.  CSW informed that she can reach out to [facility], however, pt would need to obtain her lease agreement and copy of utilities.  Patient continued to talk and stated that she was several months behind.  CSW pointed out that [facility] may not cover past months rent and due to owing other months patient may not be approved but CSW can check.  Patient claimed that she did not say she was behind.  CSW discussed with the patient if daughter lived in the home as well as this may affect [employer] being able to pay any funds. Patient reports that daughter does not live with her.  CSW informed that she would speak with [facility] and follow up.   CSW was stopped in the hallway by the patient, following patient checking in with another patient.  Patient told CSW "don't worry about it, give the funds to someone else who needs it more".   Assunta Curtis, MSW, LCSW 11/12/2018 8:45 AM

## 2018-11-12 NOTE — BHH Group Notes (Signed)
LCSW Group Therapy Note   11/12/2018 12:50 PM   Type of Therapy and Topic:  Group Therapy:  Overcoming Obstacles   Participation Level:  Active   Description of Group:    In this group patients will be encouraged to explore what they see as obstacles to their own wellness and recovery. They will be guided to discuss their thoughts, feelings, and behaviors related to these obstacles. The group will process together ways to cope with barriers, with attention given to specific choices patients can make. Each patient will be challenged to identify changes they are motivated to make in order to overcome their obstacles. This group will be process-oriented, with patients participating in exploration of their own experiences as well as giving and receiving support and challenge from other group members.   Therapeutic Goals: 1. Patient will identify personal and current obstacles as they relate to admission. 2. Patient will identify barriers that currently interfere with their wellness or overcoming obstacles.  3. Patient will identify feelings, thought process and behaviors related to these barriers. 4. Patient will identify two changes they are willing to make to overcome these obstacles:      Summary of Patient Progress Pt was appropriate and respectful in group. Pt was able to identify a current obstacle as being admitted to the unit. Pt reported how it is a trigger for her to be on the unit and how she needs to discharge to handle legal and medical issues. Pt reported that her family typically is a barrier for her because they tell her to snap out of it and do not understand her mental illness. Pt reported finding support groups and better support systems as a way to overcome her obstacles.     Therapeutic Modalities:   Cognitive Behavioral Therapy Solution Focused Therapy Motivational Interviewing Relapse Prevention Therapy  Evalina Field, MSW, LCSW Clinical Social Work 11/12/2018 12:50  PM

## 2018-11-12 NOTE — Progress Notes (Signed)
[facility] MD Progress Note  11/12/2018 5:37 PM Kelly Carr  MRN:  [id number] Subjective: Patient seen.  Patient still gets very hyperverbal and can get loose in her thinking during conversation and still seems to have some grandiosity but she is less prone to being angry.  Did not shout at me today.  Did not threaten me.  Made requests in a polite manner.  Her lithium level looks like it is gotten up into the appropriate range without any side effects.  Tolerating antipsychotics fine.  Still complaining of some chronic pain and wanting some adjustments to her Tylenol but nothing more serious than that today. Principal Problem: Bipolar disorder with severe mania ([place]) Diagnosis: Principal Problem:   Bipolar disorder with severe mania ([place]) Active Problems:   GERD (gastroesophageal reflux disease)   Noncompliance   Cannabis abuse  Total Time spent with patient: 30 minutes  Past Psychiatric History: Past history of bipolar disorder with current extended mania  Past Medical History:  Past Medical History:  Diagnosis Date  . Abnormal Pap smear    Unknown results>colpo>normal  . Anxiety   . Arthritis   . Asthma   . Bipolar 1 disorder ([place])   . Depression     Past Surgical History:  Procedure Laterality Date  . NO PAST SURGERIES     Family History:  Family History  Problem Relation Age of Onset  . Diabetes Mother   . Hypertension Mother   . Heart disease Mother 9  . Schizophrenia Mother   . Diabetes Maternal Grandmother   . Heart disease Maternal Grandmother   . Diabetes Maternal Grandfather   . Heart disease Maternal Grandfather   . Bipolar disorder Cousin   . Bipolar disorder Nephew   . Depression Daughter    Family Psychiatric  History: See previous.  Family history positive for mania Social History:  Social History   Substance and Sexual Activity  Alcohol Use Yes   Comment: "a lot"     Social History   Substance and Sexual Activity  Drug Use Not Currently     Social History   Socioeconomic History  . Marital status: Single    Spouse name: Not on file  . Number of children: Not on file  . Years of education: Not on file  . Highest education level: Not on file  Occupational History  . Not on file  Social Needs  . Financial resource strain: Not on file  . Food insecurity:    Worry: Not on file    Inability: Not on file  . Transportation needs:    Medical: Not on file    Non-medical: Not on file  Tobacco Use  . Smoking status: Current Every Day Smoker    Packs/day: 1.50    Types: Cigarettes, E-cigarettes  . Smokeless tobacco: Never Used  Substance and Sexual Activity  . Alcohol use: Yes    Comment: "a lot"  . Drug use: Not Currently  . Sexual activity: Yes    Partners: Male    Birth control/protection: None  Lifestyle  . Physical activity:    Days per week: Not on file    Minutes per session: Not on file  . Stress: Not on file  Relationships  . Social connections:    Talks on phone: Not on file    Gets together: Not on file    Attends religious service: Not on file    Active member of club or organization: Not on file    Attends meetings  of clubs or organizations: Not on file    Relationship status: Not on file  Other Topics Concern  . Not on file  Social History Narrative  . Not on file   Additional Social History:                         Sleep: Fair  Appetite:  Fair  Current Medications: Current Facility-Administered Medications  Medication Dose Route Frequency Provider Last Rate Last Dose  . acetaminophen (TYLENOL) tablet 1,000 mg  1,000 mg Oral Q8H PRN Raelee Rossmann T, MD      . alum & mag hydroxide-simeth (MAALOX/MYLANTA) 200-200-20 MG/5ML suspension 30 mL  30 mL Oral Q4H PRN Lavella Hammock, MD      . ARIPiprazole (ABILIFY) tablet 30 mg  30 mg Oral Daily Tiaja Hagan, Madie Reno, MD   30 mg at 11/12/18 0815  . ARIPiprazole ER (ABILIFY MAINTENA) injection 400 mg  400 mg Intramuscular Q28 days Breezy Hertenstein, Madie Reno, MD   400 mg at 11/01/18 1300  . chlorhexidine gluconate (MEDLINE KIT) (PERIDEX) 0.12 % solution 15 mL  15 mL Mouth/Throat BID Janayia Burggraf, Madie Reno, MD   15 mL at 11/12/18 0814  . hydrocerin (EUCERIN) cream   Topical BID Arron Mcnaught T, MD      . hydrOXYzine (ATARAX/VISTARIL) tablet 50 mg  50 mg Oral Q6H PRN Lavella Hammock, MD   50 mg at 11/11/18 1322  . lithium carbonate (ESKALITH) CR tablet 900 mg  900 mg Oral QHS Marq Rebello, Madie Reno, MD   900 mg at 11/11/18 2214  . magnesium hydroxide (MILK OF MAGNESIA) suspension 30 mL  30 mL Oral Daily PRN Lavella Hammock, MD   30 mL at 11/02/18 1125  . meloxicam (MOBIC) tablet 15 mg  15 mg Oral Daily Lela Murfin, Madie Reno, MD   15 mg at 11/12/18 0814  . multivitamin liquid 15 mL  15 mL Oral Daily Johnn Hai, MD   15 mL at 11/12/18 0815  . nicotine (NICODERM CQ - dosed in mg/24 hours) patch 21 mg  21 mg Transdermal Daily Aylen Rambert, Madie Reno, MD   21 mg at 11/12/18 0813  . OLANZapine zydis (ZYPREXA) disintegrating tablet 10 mg  10 mg Oral Q8H PRN Lavella Hammock, MD   10 mg at 11/10/18 1515  . pantoprazole (PROTONIX) EC tablet 40 mg  40 mg Oral Daily Lavella Hammock, MD   40 mg at 11/12/18 1610  . traZODone (DESYREL) tablet 100 mg  100 mg Oral QHS Lavella Hammock, MD   100 mg at 11/11/18 2214  . ziprasidone (GEODON) injection 20 mg  20 mg Intramuscular BID PRN Lando Alcalde, Madie Reno, MD   20 mg at 11/10/18 0513  . zolpidem (AMBIEN) tablet 10 mg  10 mg Oral QHS Johnn Hai, MD   10 mg at 11/11/18 2214    Lab Results: No results found for this or any previous visit (from the past 76 hour(s)).  Blood Alcohol level:  Lab Results  Component Value Date   ETH <10 10/30/2018   ETH <10 96/09/5407    Metabolic Disorder Labs: Lab Results  Component Value Date   HGBA1C 5.0 10/09/2018   MPG 96.8 10/09/2018   MPG 96.8 03/21/2018   Lab Results  Component Value Date   PROLACTIN 39.5 (H) 04/12/2018   PROLACTIN 2.0 12/05/2011   Lab Results  Component Value Date   CHOL 215  (H) 10/09/2018   TRIG 83 10/09/2018  HDL 85 10/09/2018   CHOLHDL 2.5 10/09/2018   VLDL 17 10/09/2018   LDLCALC 113 (H) 10/09/2018   LDLCALC 118 (H) 03/21/2018    Physical Findings: AIMS:  , ,  ,  ,    CIWA:    COWS:     Musculoskeletal: Strength & Muscle Tone: within normal limits Gait & Station: normal Patient leans: N/A  Psychiatric Specialty Exam: Physical Exam  Nursing note and vitals reviewed. Constitutional: She appears well-developed and well-nourished.  HENT:  Head: Normocephalic and atraumatic.  Eyes: Pupils are equal, round, and reactive to light. Conjunctivae are normal.  Neck: Normal range of motion.  Cardiovascular: Regular rhythm and normal heart sounds.  Respiratory: Effort normal.  GI: Soft.  Musculoskeletal: Normal range of motion.  Neurological: She is alert.  Skin: Skin is warm and dry.  Psychiatric: Her affect is labile. Her affect is not blunt. Her speech is rapid and/or pressured. She is agitated. She is not aggressive. Thought content is not paranoid. Cognition and memory are impaired. She expresses impulsivity. She expresses no homicidal and no suicidal ideation.    Review of Systems  Constitutional: Negative.   HENT: Negative.   Eyes: Negative.   Respiratory: Negative.   Cardiovascular: Negative.   Gastrointestinal: Negative.   Musculoskeletal: Negative.   Skin: Negative.   Neurological: Negative.   Psychiatric/Behavioral: Negative for depression, hallucinations, memory loss, substance abuse and suicidal ideas. The patient is nervous/anxious and has insomnia.     Blood pressure 124/75, pulse 83, temperature 98 F (36.7 C), temperature source Oral, resp. rate 18, height '5\' 5"'  (1.651 m), weight 72.6 kg, last menstrual period 10/30/2018, SpO2 98 %.Body mass index is 26.63 kg/m.  General Appearance: Fairly Groomed  Eye Contact:  Good  Speech:  Clear and Coherent and Pressured  Volume:  Increased  Mood:  Irritable  Affect:  Labile  Thought  Process:  Disorganized  Orientation:  Full (Time, Place, and Person)  Thought Content:  Paranoid Ideation and Rumination  Suicidal Thoughts:  No  Homicidal Thoughts:  No  Memory:  Immediate;   Fair Recent;   Fair Remote;   Fair  Judgement:  Impaired  Insight:  Shallow  Psychomotor Activity:  Decreased  Concentration:  Concentration: Fair  Recall:  [employer] of Knowledge:  Fair  Language:  Fair  Akathisia:  No  Handed:  Right  AIMS (if indicated):     Assets:  Desire for Improvement Resilience  ADL's:  Impaired  Cognition:  Impaired,  Mild  Sleep:  Number of Hours: 6     Treatment Plan Summary: Daily contact with patient to assess and evaluate symptoms and progress in treatment, Medication management and Plan Patient looks like she is starting to show some improvement although she is still disorganized and grandiose in her thinking.  At least she is less angry.  Tolerating medicine and seems to be accepting the need for treatment better.  I have changed her Tylenol based on her request to be able to get 1000 mg at a time but warned her of not excessively using acetaminophen.  Still keep it only at every 8 hours if it is going to be that dosage.  Encouraged her continued group attendance.  Alethia Berthold, MD 11/12/2018, 5:37 PM

## 2018-11-12 NOTE — Tx Team (Signed)
Interdisciplinary Treatment and Diagnostic Plan Update  11/12/2018 Time of Session: 8:30AM Kelly Carr MRN: [id number]  Principal Diagnosis: Bipolar disorder with severe mania ([place])  Secondary Diagnoses: Principal Problem:   Bipolar disorder with severe mania ([place]) Active Problems:   GERD (gastroesophageal reflux disease)   Noncompliance   Cannabis abuse   Current Medications:  Current Facility-Administered Medications  Medication Dose Route Frequency Provider Last Rate Last Dose  . acetaminophen (TYLENOL) tablet 650 mg  650 mg Oral Q6H PRN Lavella Hammock, MD   650 mg at 11/12/18 [phone]  . alum & mag hydroxide-simeth (MAALOX/MYLANTA) 200-200-20 MG/5ML suspension 30 mL  30 mL Oral Q4H PRN Lavella Hammock, MD      . ARIPiprazole (ABILIFY) tablet 30 mg  30 mg Oral Daily [name], Madie Reno, MD   30 mg at 11/12/18 0815  . ARIPiprazole ER (ABILIFY MAINTENA) injection 400 mg  400 mg Intramuscular Q28 days [name], Madie Reno, MD   400 mg at 11/01/18 1300  . chlorhexidine gluconate (MEDLINE KIT) (PERIDEX) 0.12 % solution 15 mL  15 mL Mouth/Throat BID [name], Madie Reno, MD   15 mL at 11/12/18 0814  . hydrocerin (EUCERIN) cream   Topical BID [name], [name], MD      . hydrOXYzine (ATARAX/VISTARIL) tablet 50 mg  50 mg Oral Q6H PRN Lavella Hammock, MD   50 mg at 11/11/18 1322  . lithium carbonate (ESKALITH) CR tablet 900 mg  900 mg Oral QHS [name], Madie Reno, MD   900 mg at 11/11/18 2214  . magnesium hydroxide (MILK OF MAGNESIA) suspension 30 mL  30 mL Oral Daily PRN Lavella Hammock, MD   30 mL at 11/02/18 1125  . meloxicam (MOBIC) tablet 15 mg  15 mg Oral Daily [name], Madie Reno, MD   15 mg at 11/12/18 0814  . multivitamin liquid 15 mL  15 mL Oral Daily Johnn Hai, MD   15 mL at 11/12/18 0815  . nicotine (NICODERM CQ - dosed in mg/24 hours) patch 21 mg  21 mg Transdermal Daily [name], Madie Reno, MD   21 mg at 11/12/18 0813  . OLANZapine zydis (ZYPREXA) disintegrating tablet 10 mg  10 mg Oral Q8H PRN  Lavella Hammock, MD   10 mg at 11/10/18 1515  . pantoprazole (PROTONIX) EC tablet 40 mg  40 mg Oral Daily Lavella Hammock, MD   40 mg at 11/12/18 3154  . traZODone (DESYREL) tablet 100 mg  100 mg Oral QHS Lavella Hammock, MD   100 mg at 11/11/18 2214  . ziprasidone (GEODON) injection 20 mg  20 mg Intramuscular BID PRN [name], Madie Reno, MD   20 mg at 11/10/18 0513  . zolpidem (AMBIEN) tablet 10 mg  10 mg Oral QHS Johnn Hai, MD   10 mg at 11/11/18 2214   PTA Medications: Medications Prior to Admission  Medication Sig Dispense Refill Last Dose  . ARIPiprazole (ABILIFY) 20 MG tablet Take 1 tablet (20 mg total) by mouth daily. 30 tablet 1 Past Week at Unknown time  . hydrOXYzine (ATARAX/VISTARIL) 50 MG tablet Take 1 tablet (50 mg total) by mouth every 6 (six) hours as needed for itching or anxiety. 60 tablet 1 Past Week at Unknown time  . oxcarbazepine (TRILEPTAL) 600 MG tablet Take 1 tablet (600 mg total) by mouth 2 (two) times daily. 60 tablet 1 Past Week at Unknown time  . pantoprazole (PROTONIX) 40 MG tablet Take 1 tablet (40 mg total) by mouth daily. [place]  tablet 1 Past Week at Unknown time  . sertraline (ZOLOFT) 50 MG tablet Take 1 tablet (50 mg total) by mouth daily. 30 tablet 1 Past Week at Unknown time  . traZODone (DESYREL) 100 MG tablet Take 1 tablet (100 mg total) by mouth at bedtime. 30 tablet 1 Past Week at Unknown time  . ARIPiprazole ER (ABILIFY MAINTENA) 400 MG SRER injection Inject 2 mLs (400 mg total) into the muscle every 28 (twenty-eight) days. (Patient not taking: Reported on 10/30/2018) 1 each 1 Not Taking at Unknown time    Patient Stressors: Medication change or noncompliance Other: Covid 19 worries   Patient Strengths: Physical Health Work skills  Treatment Modalities: Medication Management, Group therapy, Case management,  1 to 1 session with clinician, Psychoeducation, Recreational therapy.   Physician Treatment Plan for Primary Diagnosis: Bipolar disorder with  severe mania ([place]) Long Term Goal(s): Improvement in symptoms so as ready for discharge Improvement in symptoms so as ready for discharge   Short Term Goals: Ability to identify changes in lifestyle to reduce recurrence of condition will improve Ability to verbalize feelings will improve Ability to demonstrate self-control will improve Compliance with prescribed medications will improve Ability to identify triggers associated with substance abuse/mental health issues will improve  Medication Management: Evaluate patient's response, side effects, and tolerance of medication regimen.  Therapeutic Interventions: 1 to 1 sessions, Unit Group sessions and Medication administration.  Evaluation of Outcomes: Progressing  Physician Treatment Plan for Secondary Diagnosis: Principal Problem:   Bipolar disorder with severe mania ([place]) Active Problems:   GERD (gastroesophageal reflux disease)   Noncompliance   Cannabis abuse  Long Term Goal(s): Improvement in symptoms so as ready for discharge Improvement in symptoms so as ready for discharge   Short Term Goals: Ability to identify changes in lifestyle to reduce recurrence of condition will improve Ability to verbalize feelings will improve Ability to demonstrate self-control will improve Compliance with prescribed medications will improve Ability to identify triggers associated with substance abuse/mental health issues will improve     Medication Management: Evaluate patient's response, side effects, and tolerance of medication regimen.  Therapeutic Interventions: 1 to 1 sessions, Unit Group sessions and Medication administration.  Evaluation of Outcomes: Progressing   RN Treatment Plan for Primary Diagnosis: Bipolar disorder with severe mania ([place]) Long Term Goal(s): Knowledge of disease and therapeutic regimen to maintain health will improve  Short Term Goals: Ability to verbalize frustration and anger appropriately will improve,  Ability to demonstrate self-control, Ability to participate in decision making will improve, Ability to verbalize feelings will improve, Ability to disclose and discuss suicidal ideas and Ability to identify and develop effective coping behaviors will improve  Medication Management: RN will administer medications as ordered by provider, will assess and evaluate patient's response and provide education to patient for prescribed medication. RN will report any adverse and/or side effects to prescribing provider.  Therapeutic Interventions: 1 on 1 counseling sessions, Psychoeducation, Medication administration, Evaluate responses to treatment, Monitor vital signs and CBGs as ordered, Perform/monitor CIWA, COWS, AIMS and Fall Risk screenings as ordered, Perform wound care treatments as ordered.  Evaluation of Outcomes: Progressing   LCSW Treatment Plan for Primary Diagnosis: Bipolar disorder with severe mania ([place]) Long Term Goal(s): Safe transition to appropriate next level of care at discharge, Engage patient in therapeutic group addressing interpersonal concerns.  Short Term Goals: Engage patient in aftercare planning with referrals and resources, Increase social support, Increase ability to appropriately verbalize feelings, Increase emotional regulation and Facilitate acceptance  of mental health diagnosis and concerns  Therapeutic Interventions: Assess for all discharge needs, 1 to 1 time with Social worker, Explore available resources and support systems, Assess for adequacy in community support network, Educate family and significant other(s) on suicide prevention, Complete Psychosocial Assessment, Interpersonal group therapy.  Evaluation of Outcomes: Progressing   Progress in Treatment: Attending groups: Yes. Participating in groups: Yes. Taking medication as prescribed: Yes. Toleration medication: Yes. Family/Significant other contact made: Yes, individual(s) contacted:  SPE completed with  the patient's daughter.  Patient understands diagnosis: Yes. Discussing patient identified problems/goals with staff: Yes. Medical problems stabilized or resolved: Yes. Denies suicidal/homicidal ideation: Yes. Issues/concerns per patient self-inventory: No. Other: none  New problem(s) identified: No, Describe:  none  New Short Term/Long Term Goal(s): medication management for mood stabilization; elimination of SI thoughts; development of comprehensive mental wellness plan.  Patient Goals:  "get my ducks in a row"  Discharge Plan or Barriers: Patient continues to reports plans to return to her home.  Patient has reported that she has received an eviction notice.  Patient originally asked for assistance from [facility] for rent, however, once CSW informed her of the necessary paperwork and attempted to clarify the patient's statement that she owed "some backpay", pt declined the service.  CSW will clarify with the patient what her living situation plans are.  Patient remains manic.  She continues to disrupt group and require redirection.  CSW has attempted several times to schedule aftercare appointment with the patient's EAP therapist, [name], however, the therapist declines and states that the patient can call her once she is discharged.  Lattie Haw continues to recommend a CST referral, however, the patient declines to sign a release for a CST referral.  CSW has informed Lattie Haw of this.  Medication management appointment has been canceled due to patient being in the hospital and will need to be rescheduled.   Reason for Continuation of Hospitalization: Anxiety Depression Mania Medication stabilization  Estimated Length of Stay: 1-5 days  Recreational Therapy: Patient Stressors: N/A Patient Goal: Patient will successfully identify 2 ways of making healthy decisions post d/c within 5 recreation therapy group sessions  Attendees: Patient: 11/12/2018 9:40 AM  Physician: Dr.  Weber Cooks, MD 11/12/2018 9:40 AM  Nursing:  11/12/2018 9:40 AM  RN Care Manager: 11/12/2018 9:40 AM  Social Worker: Assunta Curtis, LCSW 11/12/2018 9:40 AM  Recreational Therapist:  11/12/2018 9:40 AM  Other:  11/12/2018 9:40 AM  Other:   11/12/2018 9:40 AM  Other: 11/12/2018 9:40 AM    Scribe for Treatment Team: Rozann Lesches, LCSW 11/12/2018 9:40 AM

## 2018-11-12 NOTE — Plan of Care (Signed)
Patient is alert and oriented x 4, ambulating the unit with a steady gait. Showing the ability to use her coping skills. Patient became upset yesterday during group. Patient spoke with this nurse regarding a situation that happened during group. Patient very tearful and was able to calm her self down using her coping skills. Denies SI/HI/AVH and pain. Compliant with treatment plan. Milieu remains safe with q 15 minute safety checks.

## 2018-11-13 NOTE — Progress Notes (Signed)
[facility] MD Progress Note  11/13/2018 4:58 PM Kelly Carr  MRN:  [id number] Subjective: Patient seen and chart reviewed.  Her behavior seems to have improved quite a bit.  Although she was still ramble on when allowed to speak freely and uninterrupted it is not difficult to interrupt her and redirect her.  She is less grandiose and is much more realistic.  Had conversations with her family today and instead of cursing out thumb seems to have taken some of their concerns to heart.  No new physical complaints.  Not screaming or cursing at me or any other staff. Principal Problem: Bipolar disorder with severe mania ([place]) Diagnosis: Principal Problem:   Bipolar disorder with severe mania ([place]) Active Problems:   GERD (gastroesophageal reflux disease)   Noncompliance   Cannabis abuse  Total Time spent with patient: 30 minutes  Past Psychiatric History: History of bipolar disorder multiple hospitalizations  Past Medical History:  Past Medical History:  Diagnosis Date  . Abnormal Pap smear    Unknown results>colpo>normal  . Anxiety   . Arthritis   . Asthma   . Bipolar 1 disorder ([place])   . Depression     Past Surgical History:  Procedure Laterality Date  . NO PAST SURGERIES     Family History:  Family History  Problem Relation Age of Onset  . Diabetes Mother   . Hypertension Mother   . Heart disease Mother 67  . Schizophrenia Mother   . Diabetes Maternal Grandmother   . Heart disease Maternal Grandmother   . Diabetes Maternal Grandfather   . Heart disease Maternal Grandfather   . Bipolar disorder Cousin   . Bipolar disorder Nephew   . Depression Daughter    Family Psychiatric  History: Positive for bipolar disorder in close relatives Social History:  Social History   Substance and Sexual Activity  Alcohol Use Yes   Comment: "a lot"     Social History   Substance and Sexual Activity  Drug Use Not Currently    Social History   Socioeconomic History  . Marital status:  Single    Spouse name: Not on file  . Number of children: Not on file  . Years of education: Not on file  . Highest education level: Not on file  Occupational History  . Not on file  Social Needs  . Financial resource strain: Not on file  . Food insecurity:    Worry: Not on file    Inability: Not on file  . Transportation needs:    Medical: Not on file    Non-medical: Not on file  Tobacco Use  . Smoking status: Current Every Day Smoker    Packs/day: 1.50    Types: Cigarettes, E-cigarettes  . Smokeless tobacco: Never Used  Substance and Sexual Activity  . Alcohol use: Yes    Comment: "a lot"  . Drug use: Not Currently  . Sexual activity: Yes    Partners: Male    Birth control/protection: None  Lifestyle  . Physical activity:    Days per week: Not on file    Minutes per session: Not on file  . Stress: Not on file  Relationships  . Social connections:    Talks on phone: Not on file    Gets together: Not on file    Attends religious service: Not on file    Active member of club or organization: Not on file    Attends meetings of clubs or organizations: Not on file  Relationship status: Not on file  Other Topics Concern  . Not on file  Social History Narrative  . Not on file   Additional Social History:                         Sleep: Fair  Appetite:  Fair  Current Medications: Current Facility-Administered Medications  Medication Dose Route Frequency Provider Last Rate Last Dose  . acetaminophen (TYLENOL) tablet 975 mg  975 mg Oral Q8H PRN , Madie Reno, MD   975 mg at 11/12/18 1842  . alum & mag hydroxide-simeth (MAALOX/MYLANTA) 200-200-20 MG/5ML suspension 30 mL  30 mL Oral Q4H PRN Lavella Hammock, MD      . ARIPiprazole (ABILIFY) tablet 30 mg  30 mg Oral Daily , Madie Reno, MD   30 mg at 11/13/18 0744  . ARIPiprazole ER (ABILIFY MAINTENA) injection 400 mg  400 mg Intramuscular Q28 days , Madie Reno, MD   400 mg at 11/01/18 1300  .  chlorhexidine gluconate (MEDLINE KIT) (PERIDEX) 0.12 % solution 15 mL  15 mL Mouth/Throat BID , Madie Reno, MD   15 mL at 11/12/18 0814  . hydrocerin (EUCERIN) cream   Topical BID ,  T, MD      . hydrOXYzine (ATARAX/VISTARIL) tablet 50 mg  50 mg Oral Q6H PRN Lavella Hammock, MD   50 mg at 11/11/18 1322  . lithium carbonate (ESKALITH) CR tablet 900 mg  900 mg Oral QHS , Madie Reno, MD   900 mg at 11/12/18 2156  . magnesium hydroxide (MILK OF MAGNESIA) suspension 30 mL  30 mL Oral Daily PRN Lavella Hammock, MD   30 mL at 11/02/18 1125  . meloxicam (MOBIC) tablet 15 mg  15 mg Oral Daily , Madie Reno, MD   15 mg at 11/13/18 0744  . multivitamin liquid 15 mL  15 mL Oral Daily n Hai, MD   15 mL at 11/13/18 0745  . nicotine (NICODERM CQ - dosed in mg/24 hours) patch 21 mg  21 mg Transdermal Daily , Madie Reno, MD   21 mg at 11/13/18 0650  . OLANZapine zydis (ZYPREXA) disintegrating tablet 10 mg  10 mg Oral Q8H PRN Lavella Hammock, MD   10 mg at 11/10/18 1515  . pantoprazole (PROTONIX) EC tablet 40 mg  40 mg Oral Daily Lavella Hammock, MD   40 mg at 11/13/18 0745  . traZODone (DESYREL) tablet 100 mg  100 mg Oral QHS Lavella Hammock, MD   100 mg at 11/12/18 2157  . ziprasidone (GEODON) injection 20 mg  20 mg Intramuscular BID PRN , Madie Reno, MD   20 mg at 11/10/18 0513  . zolpidem (AMBIEN) tablet 10 mg  10 mg Oral QHS n Hai, MD   10 mg at 11/12/18 2157    Lab Results: No results found for this or any previous visit (from the past 40 hour(s)).  Blood Alcohol level:  Lab Results  Component Value Date   ETH <10 10/30/2018   ETH <10 38/33/3832    Metabolic Disorder Labs: Lab Results  Component Value Date   HGBA1C 5.0 10/09/2018   MPG 96.8 10/09/2018   MPG 96.8 03/21/2018   Lab Results  Component Value Date   PROLACTIN 39.5 (H) 04/12/2018   PROLACTIN 2.0 12/05/2011   Lab Results  Component Value Date   CHOL 215 (H) 10/09/2018   TRIG 83  10/09/2018   HDL 85 10/09/2018  CHOLHDL 2.5 10/09/2018   VLDL 17 10/09/2018   LDLCALC 113 (H) 10/09/2018   LDLCALC 118 (H) 03/21/2018    Physical Findings: AIMS:  , ,  ,  ,    CIWA:    COWS:     Musculoskeletal: Strength & Muscle Tone: within normal limits Gait & Station: normal Patient leans: N/A  Psychiatric Specialty Exam: Physical Exam  Nursing note and vitals reviewed. Constitutional: She appears well-developed and well-nourished.  HENT:  Head: Normocephalic and atraumatic.  Eyes: Pupils are equal, round, and reactive to light. Conjunctivae are normal.  Neck: Normal range of motion.  Cardiovascular: Regular rhythm and normal heart sounds.  Respiratory: Effort normal.  GI: Soft.  Musculoskeletal: Normal range of motion.  Neurological: She is alert.  Skin: Skin is warm and dry.  Psychiatric: She has a normal mood and affect. Her behavior is normal. Thought content normal. Her speech is rapid and/or pressured. Cognition and memory are normal. She expresses impulsivity.    Review of Systems  Constitutional: Negative.   HENT: Negative.   Eyes: Negative.   Respiratory: Negative.   Cardiovascular: Negative.   Gastrointestinal: Negative.   Musculoskeletal: Negative.   Skin: Negative.   Neurological: Negative.   Psychiatric/Behavioral: Negative.     Blood pressure 122/76, pulse 87, temperature 98 F (36.7 C), temperature source Oral, resp. rate 18, height 5' 5" (1.651 m), weight 72.6 kg, last menstrual period 10/30/2018, SpO2 100 %.Body mass index is 26.63 kg/m.  General Appearance: Casual  Eye Contact:  Good  Speech:  Clear and Coherent  Volume:  Normal  Mood:  Euphoric  Affect:  Congruent  Thought Process:  Goal Directed  Orientation:  Full (Time, Place, and Person)  Thought Content:  Logical  Suicidal Thoughts:  No  Homicidal Thoughts:  No  Memory:  Immediate;   Fair Recent;   Fair Remote;   Fair  Judgement:  Fair  Insight:  Fair  Psychomotor Activity:   Normal  Concentration:  Concentration: Fair  Recall:  [employer] of Knowledge:  Fair  Language:  Fair  Akathisia:  No  Handed:  Right  AIMS (if indicated):     Assets:  Communication Skills Desire for Improvement Physical Health Resilience  ADL's:  Intact  Cognition:  WNL  Sleep:  Number of Hours: 5.5     Treatment Plan Summary: Daily contact with patient to assess and evaluate symptoms and progress in treatment, Medication management and Plan Finally looking much better.  Probably be able to discharge her on Thursday.  Reviewed medication usage and side effects.  Emphasized to her the need to stay well-hydrated while on lithium.  Alethia Berthold, MD 11/13/2018, 4:58 PM

## 2018-11-13 NOTE — [facility] Group Notes (Signed)
Feelings Around Diagnosis 11/13/2018 1PM  Type of Therapy/Topic:  Group Therapy:  Feelings about Diagnosis  Participation Level:  Active   Description of Group:   This group will allow patients to explore their thoughts and feelings about diagnoses they have received. Patients will be guided to explore their level of understanding and acceptance of these diagnoses. Facilitator will encourage patients to process their thoughts and feelings about the reactions of others to their diagnosis and will guide patients in identifying ways to discuss their diagnosis with significant others in their lives. This group will be process-oriented, with patients participating in exploration of their own experiences, giving and receiving support, and processing challenge from other group members.   Therapeutic Goals: 1. Patient will demonstrate understanding of diagnosis as evidenced by identifying two or more symptoms of the disorder 2. Patient will be able to express two feelings regarding the diagnosis 3. Patient will demonstrate their ability to communicate their needs through discussion and/or role play  Summary of Patient Progress:  Actively and appropriately engaged in the group. Patient was able to provide support and validation to other group members. Patient interacted appropriately with other group members and required no redirection. Patient discussed with group members how her family does not understand her mental health diagnosis and how she is working on improving the dynamics of her relationship with her children.     Therapeutic Modalities:   Cognitive Behavioral Therapy Brief Therapy Feelings Identification    Kelly Rack, LCSW 11/13/2018 2:46 PM

## 2018-11-13 NOTE — Plan of Care (Signed)
Pt states she slept "okay". Pt rates depression and anxiety 0/10. Pt denies SI, HI and AVH. Pt was educated on care plan and verbalizes understanding. Collier Bullock RN Problem: Education: Goal: Ability to state activities that reduce stress will improve Outcome: Progressing   Problem: Self-Concept: Goal: Ability to identify factors that promote anxiety will improve Outcome: Progressing Goal: Level of anxiety will decrease Outcome: Progressing Goal: Ability to modify response to factors that promote anxiety will improve Outcome: Progressing   Problem: Activity: Goal: Interest or engagement in leisure activities will improve Outcome: Progressing Goal: Imbalance in normal sleep/wake cycle will improve Outcome: Progressing   Problem: Coping: Goal: Coping ability will improve Outcome: Progressing Goal: Will verbalize feelings Outcome: Progressing   Problem: Health Behavior/Discharge Planning: Goal: Ability to make decisions will improve Outcome: Progressing Goal: Compliance with therapeutic regimen will improve Outcome: Progressing   Problem: Safety: Goal: Ability to disclose and discuss suicidal ideas will improve Outcome: Progressing Goal: Ability to identify and utilize support systems that promote safety will improve Outcome: Progressing   Problem: Education: Goal: Will be free of psychotic symptoms Outcome: Not Progressing Goal: Knowledge of the prescribed therapeutic regimen will improve Outcome: Progressing   Problem: Health Behavior/Discharge Planning: Goal: Compliance with prescribed medication regimen will improve Outcome: Progressing   Problem: Self-Care: Goal: Ability to participate in self-care as condition permits will improve Outcome: Progressing

## 2018-11-13 NOTE — Plan of Care (Signed)
Patient didn't request her anxiety medication this evening stating, "I am doing really good not needing it as much as I use to."   Problem: Self-Concept: Goal: Level of anxiety will decrease Outcome: Progressing

## 2018-11-13 NOTE — Progress Notes (Signed)
D - Patient was in the day room upon arrival.Patient was pleasant this evening. Patient was hyperactive but there were not outbursts from her this evening. Patient denies SI/HI/AVH, pain, anxiety and depression with this Probation officer. Patient stated, "I don't even need my anxiety medication today. I have been working hard to not need it using my coping skills." Patient present with a better affect this week than she had last time this writer saw her. Patient had less pressured speech and was more logical than last week.   A - Patient compliant with medication administration per MD orders. Patient given education. Patient given support and encouragement to be active in her treatment plan. Patient informed to let staff know if there are any issues or problems on the unit.   R - Patient being monitored Q 15 minutes for safety per unit protocol. Patient remains safe on the unit.

## 2018-11-13 NOTE — Progress Notes (Signed)
Recreation Therapy Notes  Date: 11/13/2018  Time: 9:30 am  Location: Craft room  Behavioral response: Appropriate  Intervention Topic: Happiness  Discussion/Intervention:  Group content today was focused on Happiness. The group defined happiness and described where happiness comes from. Individuals identified what makes them happy and how they go about making others happy. Patients expressed things that stop them from being happy and ways they can improve their happiness. The group stated reasons why it is important to be happy. The group participated in the intervention "My Happiness", where they had a chance to identify and express things that make them happy. Clinical Observations/Feedback:  Patient came to group and defined happiness as emotions. She identified her kids, eating chocolate/food and money are things that make her happy. Participant explained happiness comes from what makes you feel good. Patient expressed she is responsible for her own happiness and it has a lot to do with her environment. Individual was social with peers and staff while participating in the intervention. Kurtiss Wence LRT/CTRS         Hurshell Dino 11/13/2018 11:36 AM

## 2018-11-14 ENCOUNTER — Ambulatory Visit: Payer: Self-pay | Admitting: Psychiatry

## 2018-11-14 MED ORDER — ARIPIPRAZOLE 30 MG PO TABS: 30.0000 mg | ORAL_TABLET | Freq: Every day | ORAL | 1 refills | Status: DC

## 2018-11-14 MED ORDER — TRAZODONE HCL 100 MG PO TABS: 100.0000 mg | ORAL_TABLET | Freq: Every day | ORAL | 1 refills | Status: DC

## 2018-11-14 MED ORDER — PANTOPRAZOLE SODIUM 40 MG PO TBEC: 40.0000 mg | DELAYED_RELEASE_TABLET | Freq: Every day | ORAL | 1 refills | Status: DC

## 2018-11-14 MED ORDER — ARIPIPRAZOLE ER 400 MG IM SRER: 400.0000 mg | INTRAMUSCULAR | 1 refills | Status: DC

## 2018-11-14 MED ORDER — CHLORHEXIDINE GLUCONATE 0.12% ORAL RINSE (MEDLINE KIT): 15.0000 mL | Freq: Two times a day (BID) | OROMUCOSAL | 1 refills | Status: DC

## 2018-11-14 MED ORDER — MELOXICAM 15 MG PO TABS: 15.0000 mg | ORAL_TABLET | Freq: Every day | ORAL | 0 refills | Status: DC

## 2018-11-14 MED ORDER — LITHIUM CARBONATE ER 450 MG PO TBCR: 900.0000 mg | EXTENDED_RELEASE_TABLET | Freq: Every day | ORAL | 1 refills | Status: DC

## 2018-11-14 MED ORDER — ZOLPIDEM TARTRATE 10 MG PO TABS: 10.0000 mg | ORAL_TABLET | Freq: Every day | ORAL | 1 refills | Status: DC

## 2018-11-14 NOTE — BHH Suicide Risk Assessment (Signed)
[facility] Discharge Suicide Risk Assessment   Principal Problem: Bipolar disorder with severe mania ([place]) Discharge Diagnoses: Principal Problem:   Bipolar disorder with severe mania ([place]) Active Problems:   GERD (gastroesophageal reflux disease)   Noncompliance   Cannabis abuse   Total Time spent with patient: 45 minutes  Musculoskeletal: Strength & Muscle Tone: within normal limits Gait & Station: normal Patient leans: N/A  Psychiatric Specialty Exam: Review of Systems  Constitutional: Negative.   HENT: Negative.   Eyes: Negative.   Respiratory: Negative.   Cardiovascular: Negative.   Gastrointestinal: Negative.   Musculoskeletal: Negative.   Skin: Negative.   Neurological: Negative.   Psychiatric/Behavioral: Negative.     Blood pressure (!) 141/61, pulse 97, temperature 98.5 F (36.9 C), temperature source Oral, resp. rate 18, height 5\' 5"  (1.651 m), weight 72.6 kg, last menstrual period 10/30/2018, SpO2 100 %.Body mass index is 26.63 kg/m.  General Appearance: Fairly Groomed  Engineer, water::  Good  Speech:  Normal Rate409  Volume:  Normal  Mood:  Euthymic  Affect:  Congruent  Thought Process:  Coherent  Orientation:  Full (Time, Place, and Person)  Thought Content:  Logical  Suicidal Thoughts:  No  Homicidal Thoughts:  No  Memory:  Immediate;   Fair Recent;   Fair Remote;   Fair  Judgement:  Fair  Insight:  Fair  Psychomotor Activity:  Normal  Concentration:  Fair  Recall:  [employer] of [place]  Language: Fair  Akathisia:  No  Handed:  Right  AIMS (if indicated):     Assets:  Communication Skills Desire for Improvement Financial Resources/Insurance Housing Physical Health Resilience Social Support  Sleep:  Number of Hours: 6.5  Cognition: WNL  ADL's:  Intact   Mental Status Per Nursing Assessment::   On Admission:  NA  Demographic Factors:  Caucasian and Living alone  Loss Factors: Financial problems/change in socioeconomic  status  Historical Factors: Impulsivity  Risk Reduction Factors:   Sense of responsibility to family, Religious beliefs about death, Positive social support and Positive therapeutic relationship  Continued Clinical Symptoms:  Bipolar Disorder:   Mixed State  Cognitive Features That Contribute To Risk:  Polarized thinking    Suicide Risk:  Minimal: No identifiable suicidal ideation.  Patients presenting with no risk factors but with morbid ruminations; may be classified as minimal risk based on the severity of the depressive symptoms  Follow-up Information    Group, [facility]. Go on 12/12/2018.   Specialty:  Behavioral Health Why:  Patient has follow up on 12/12/2018 at 11:30AM with [name] for medication management.  Contact information: [address] [place] [address] [phone]           Plan Of Care/Follow-up recommendations:  Activity:  Activity as tolerated Diet:  Regular diet Other:  Patient has appointment to follow-up at [facility] in [place].  She understands the importance of staying on medication and not relapsing into substance abuse.  Patient currently is calm with euthymic affect no suicidal ideation and appears stable and no longer meeting commitment criteria  Alethia Berthold, MD 11/14/2018, 12:10 PM

## 2018-11-14 NOTE — Progress Notes (Signed)
Recreation Therapy Notes  INPATIENT RECREATION TR PLAN  Patient Details Name: Kelly Carr MRN: [id number] DOB: 1970/06/23 Today's Date: 11/14/2018  Rec Therapy Plan Is patient appropriate for Therapeutic Recreation?: Yes Treatment times per week: At least 3 Estimated Length of Stay: 5-7 days TR Treatment/Interventions: Group participation (Comment)  Discharge Criteria Pt will be discharged from therapy if:: Discharged Treatment plan/goals/alternatives discussed and agreed upon by:: Patient/family  Discharge Summary Short term goals set: Patient will successfully identify 2 ways of making healthy decisions post d/c within 5 recreation therapy group sessions Short term goals met: Complete Progress toward goals comments: Groups attended Which groups?: Self-esteem, Wellness, Goal setting, Anger management, Stress management, Other (Comment)(Relaxation, Happiness) Reason goals not met: N/A Therapeutic equipment acquired: N/A Reason patient discharged from therapy: Discharge from hospital Pt/family agrees with progress & goals achieved: Yes Date patient discharged from therapy: 11/14/18   Cherrell Maybee 11/14/2018, 11:27 AM

## 2018-11-14 NOTE — Progress Notes (Signed)
Patient ID: Kelly Carr, female   DOB: 10-25-1970, 48 y.o.   MRN: [id number]   Discharge Note:  Patient denies SI/HI/AVH at this time. Discharge instructions, AVS, prescriptions, and transition record gone over with patient. Patient agrees to comply with medication management, follow-up visit, and outpatient therapy. Patient belongings returned to patient. Patient questions and concerns addressed and answered. Patient ambulatory off unit. Patient discharged to home with friend.

## 2018-11-14 NOTE — Discharge Summary (Signed)
Physician Discharge Summary Note  Patient:  Kelly Carr is an 48 y.o., female MRN:  [id number] DOB:  17-Mar-1971 Patient phone:  [phone] (home)  Patient address:   [address] [place] [address],  Total Time spent with patient: 45 minutes  Date of Admission:  10/31/2018 Date of Discharge: November 14, 2018  Reason for Admission: Admitted because of continued manic psychotic behavior in the community  Principal Problem: Bipolar disorder with severe mania [facility]) Discharge Diagnoses: Principal Problem:   Bipolar disorder with severe mania ([place]) Active Problems:   GERD (gastroesophageal reflux disease)   Noncompliance   Cannabis abuse   Past Psychiatric History: History of bipolar disorder.  Extended mania.  Multiple hospitalizations because of resumption of psychotic symptoms  Past Medical History:  Past Medical History:  Diagnosis Date  . Abnormal Pap smear    Unknown results>colpo>normal  . Anxiety   . Arthritis   . Asthma   . Bipolar 1 disorder ([place])   . Depression     Past Surgical History:  Procedure Laterality Date  . NO PAST SURGERIES     Family History:  Family History  Problem Relation Age of Onset  . Diabetes Mother   . Hypertension Mother   . Heart disease Mother 35  . Schizophrenia Mother   . Diabetes Maternal Grandmother   . Heart disease Maternal Grandmother   . Diabetes Maternal Grandfather   . Heart disease Maternal Grandfather   . Bipolar disorder Cousin   . Bipolar disorder Nephew   . Depression Daughter    Family Psychiatric  History: Positive for bipolar disorder Social History:  Social History   Substance and Sexual Activity  Alcohol Use Yes   Comment: "a lot"     Social History   Substance and Sexual Activity  Drug Use Not Currently    Social History   Socioeconomic History  . Marital status: Single    Spouse name: Not on file  . Number of children: Not on file  . Years of education: Not on file  . Highest  education level: Not on file  Occupational History  . Not on file  Social Needs  . Financial resource strain: Not on file  . Food insecurity:    Worry: Not on file    Inability: Not on file  . Transportation needs:    Medical: Not on file    Non-medical: Not on file  Tobacco Use  . Smoking status: Current Every Day Smoker    Packs/day: 1.50    Types: Cigarettes, E-cigarettes  . Smokeless tobacco: Never Used  Substance and Sexual Activity  . Alcohol use: Yes    Comment: "a lot"  . Drug use: Not Currently  . Sexual activity: Yes    Partners: Male    Birth control/protection: None  Lifestyle  . Physical activity:    Days per week: Not on file    Minutes per session: Not on file  . Stress: Not on file  Relationships  . Social connections:    Talks on phone: Not on file    Gets together: Not on file    Attends religious service: Not on file    Active member of club or organization: Not on file    Attends meetings of clubs or organizations: Not on file    Relationship status: Not on file  Other Topics Concern  . Not on file  Social History Narrative  . Not on file    Hospital Course: Patient admitted to  the psychiatric ward.  Continues to show manic symptoms.  She is able to get it under control for brief periods of time but quickly resumes hyper verbality grandiosity disorganized thinking.  Patient's medications were adjusted.  Continue oral Abilify but also started lithium.  Patient tolerated medication and had a blood level at the time of discharge close to 1.  Gradually showed improvement in her mania.  Became less delusional and less disorganized.  Able to discuss outpatient treatment more realistically.  Able to stay on topic and not lose her temper.  Has reconciled apparently to some extent with her family.  Patient received counseling at length about bipolar and appropriate treatment and appropriate use of lithium.  Patient is agreeable at this point to follow-up with her  outpatient doctors in [place]  Physical Findings: AIMS:  , ,  ,  ,    CIWA:    COWS:     Musculoskeletal: Strength & Muscle Tone: within normal limits Gait & Station: normal Patient leans: N/A  Psychiatric Specialty Exam: Physical Exam  Nursing note and vitals reviewed. Constitutional: She appears well-developed and well-nourished.  HENT:  Head: Normocephalic and atraumatic.  Eyes: Pupils are equal, round, and reactive to light. Conjunctivae are normal.  Neck: Normal range of motion.  Cardiovascular: Regular rhythm and normal heart sounds.  Respiratory: Effort normal.  GI: Soft.  Musculoskeletal: Normal range of motion.  Neurological: She is alert.  Skin: Skin is warm and dry.  Psychiatric: She has a normal mood and affect. Her speech is normal and behavior is normal. Judgment and thought content normal. Her speech is not delayed. Cognition and memory are normal.    Review of Systems  Constitutional: Negative.   HENT: Negative.   Eyes: Negative.   Respiratory: Negative.   Cardiovascular: Negative.   Gastrointestinal: Negative.   Musculoskeletal: Negative.   Skin: Negative.   Neurological: Negative.   Psychiatric/Behavioral: Negative.     Blood pressure (!) 141/61, pulse 97, temperature 98.5 F (36.9 C), temperature source Oral, resp. rate 18, height '5\' 5"'  (1.651 m), weight 72.6 kg, last menstrual period 10/30/2018, SpO2 100 %.Body mass index is 26.63 kg/m.  General Appearance: Casual  Eye Contact:  Good  Speech:  Clear and Coherent  Volume:  Normal  Mood:  Euthymic  Affect:  Congruent  Thought Process:  Goal Directed  Orientation:  Full (Time, Place, and Person)  Thought Content:  Logical  Suicidal Thoughts:  No  Homicidal Thoughts:  No  Memory:  Immediate;   Fair Recent;   Fair Remote;   Fair  Judgement:  Fair  Insight:  Fair  Psychomotor Activity:  Decreased  Concentration:  Concentration: Fair  Recall:  [employer] of Knowledge:  Fair  Language:   Fair  Akathisia:  No  Handed:  Right  AIMS (if indicated):     Assets:  Desire for Improvement Housing Physical Health Resilience  ADL's:  Intact  Cognition:  WNL  Sleep:  Number of Hours: 6.5     Have you used any form of tobacco in the last 30 days? (Cigarettes, Smokeless Tobacco, Cigars, and/or Pipes): No  Has this patient used any form of tobacco in the last 30 days? (Cigarettes, Smokeless Tobacco, Cigars, and/or Pipes) Yes, Yes, A prescription for an FDA-approved tobacco cessation medication was offered at discharge and the patient refused  Blood Alcohol level:  Lab Results  Component Value Date   [facility] <10 10/30/2018   ETH <10 05/29/8249    Metabolic Disorder  Labs:  Lab Results  Component Value Date   HGBA1C 5.0 10/09/2018   MPG 96.8 10/09/2018   MPG 96.8 03/21/2018   Lab Results  Component Value Date   PROLACTIN 39.5 (H) 04/12/2018   PROLACTIN 2.0 12/05/2011   Lab Results  Component Value Date   CHOL 215 (H) 10/09/2018   TRIG 83 10/09/2018   HDL 85 10/09/2018   CHOLHDL 2.5 10/09/2018   VLDL 17 10/09/2018   LDLCALC 113 (H) 10/09/2018   LDLCALC 118 (H) 03/21/2018    See Psychiatric Specialty Exam and Suicide Risk Assessment completed by Attending Physician prior to discharge.  Discharge destination:  Home  Is patient on multiple antipsychotic therapies at discharge:  No   Has Patient had three or more failed trials of antipsychotic monotherapy by history:  No  Recommended Plan for Multiple Antipsychotic Therapies: NA  Discharge Instructions    Diet - low sodium heart healthy   Complete by:  As directed    Increase activity slowly   Complete by:  As directed      Allergies as of 11/14/2018      Reactions   Haldol [haloperidol Lactate] Other (See Comments)   Syncope   Risperidone And Related Other (See Comments)   Zones out   Tramadol Itching   Valproic Acid       Medication List    STOP taking these medications   hydrOXYzine 50 MG  tablet Commonly known as:  ATARAX/VISTARIL   oxcarbazepine 600 MG tablet Commonly known as:  TRILEPTAL   sertraline 50 MG tablet Commonly known as:  ZOLOFT     TAKE these medications     Indication  ARIPiprazole 30 MG tablet Commonly known as:  ABILIFY Take 1 tablet (30 mg total) by mouth daily. Start taking on:  November 15, 2018 What changed:    medication strength  how much to take  Indication:  MIXED BIPOLAR AFFECTIVE DISORDER   ARIPiprazole ER 400 MG Srer injection Commonly known as:  ABILIFY MAINTENA Inject 2 mLs (400 mg total) into the muscle every 28 (twenty-eight) days. Start taking on:  November 29, 2018 What changed:  Another medication with the same name was changed. Make sure you understand how and when to take each.  Indication:  MIXED BIPOLAR AFFECTIVE DISORDER   chlorhexidine gluconate (MEDLINE KIT) 0.12 % solution Commonly known as:  PERIDEX Use as directed 15 mLs in the mouth or throat 2 (two) times daily.  Indication:  Gum Inflammation   lithium carbonate 450 MG CR tablet Commonly known as:  ESKALITH Take 2 tablets (900 mg total) by mouth at bedtime.  Indication:  Manic-Depression   meloxicam 15 MG tablet Commonly known as:  MOBIC Take 1 tablet (15 mg total) by mouth daily. Start taking on:  November 15, 2018  Indication:  Joint Damage causing Pain and Loss of Function   pantoprazole 40 MG tablet Commonly known as:  PROTONIX Take 1 tablet (40 mg total) by mouth daily.  Indication:  Gastroesophageal Reflux Disease   traZODone 100 MG tablet Commonly known as:  DESYREL Take 1 tablet (100 mg total) by mouth at bedtime.  Indication:  Trouble Sleeping   zolpidem 10 MG tablet Commonly known as:  AMBIEN Take 1 tablet (10 mg total) by mouth at bedtime.  Indication:  Trouble Sleeping      Follow-up Information    [facility], [facility]. Go on 12/12/2018.   Specialty:  Behavioral Health Why:  Patient has follow up on 12/12/2018 at  11:30AM with [name] for medication management.  Contact information: [place] [address] [phone]           Follow-up recommendations:  Activity:  Activity as tolerated Diet:  Regular diet Other:  [facility]  Comments: Prescriptions given for up to 49-monthsupply.  Psychoeducation about the use of medicine.  Encouragement to stay well-hydrated while staying on lithium.  Patient is no longer meeting commitment criteria and is agreeable to the plan  Signed: JAlethia Berthold MD 11/14/2018, 5:20 PM

## 2018-11-14 NOTE — Progress Notes (Signed)
D- Patient alert and oriented. Patient presents in a pleasant mood on assessment reporting that she slept good last night and the only complaint she had to voice was of mouth pain, rating it a "5/10". Patient requested medication from this writer. Patient denies SI, HI, AVH, at this time. Patient also denies any signs/symptoms of depression/anxiety. Patient's goal for today is "staying focused on going home", in which she will "use my coping skills" in order to accomplish this goal.  A- Scheduled medications administered to patient, per MD orders. Support and encouragement provided.  Routine safety checks conducted every 15 minutes.  Patient informed to notify staff with problems or concerns.  R- No adverse drug reactions noted. Patient contracts for safety at this time. Patient compliant with medications and treatment plan. Patient receptive, calm, and cooperative. Patient interacts well with others on the unit.  Patient remains safe at this time.

## 2018-11-14 NOTE — Progress Notes (Addendum)
  [facility] Adult Case Management Discharge Plan :  Will you be returning to the same living situation after discharge:  Yes,  pt is returning home At discharge, do you have transportation home?: Yes,  pt reports a peer will pick her up Do you have the ability to pay for your medications: Yes,  Medicare  Release of information consent forms completed and in the chart;  Patient's signature needed at discharge.  Patient to Follow up at: Follow-up Information    [facility]. Go on 12/12/2018.   Specialty:  Behavioral Health Why:  Patient has follow up on 12/12/2018 at 11:30AM with [name] for medication management.  Contact information: [place] [address] [phone]  Please follow up with your outpatient provider, [name], she asked that you call to schedule follow up once you have been discharged.             Next level of care provider has access to [place] and Suicide Prevention discussed: Yes,  SPE completed with the patient's daughter  Have you used any form of tobacco in the last 30 days? (Cigarettes, Smokeless Tobacco, Cigars, and/or Pipes): No  Has patient been referred to the Quitline?: N/A patient is not a smoker  Patient has been referred for addiction treatment: [place], LCSW 11/14/2018, 9:07 AM

## 2018-11-14 NOTE — Progress Notes (Signed)
Recreation Therapy Notes  Date: 11/14/2018  Time: 9:30 am  Location: Craft room  Behavioral response: Appropriate  Intervention Topic: Self-esteem  Discussion/Intervention:  Group content today was focused on self-esteem. Patient defined self-esteem and where it comes form. The group described reasons self-esteem is important. Individuals stated things that impact self-esteem and positive ways to improve self-esteem. The group participated in the intervention "Affirmation Frame" where patients were able to create a frame of positive affirmation they could use outside the hospital.  Clinical Observations/Feedback:  Patient came to group and defined self-esteem as a reflection of self. She explained that her self-esteem is normally affected by the things she does and the way she is normally treated.  Participant explained that her losing weight while being in the hospital has improved her self-esteem. Individual was social with peers and staff while participating in the intervention. Towanna Avery LRT/CTRS         Avaleigh Decuir 11/14/2018 11:11 AM

## 2018-11-14 NOTE — Plan of Care (Signed)
Patient is comfortable and maintaining safety in the unit, educate patient to keep social distance with peers, patient mood is good and affect is good , tolerating are medications with out any side effects , approached patient for assessment, when she said I feel amazing today, patient is in good spirit `also stated that she will be discharged by Thursday, compression stockings is encouraged , patient denies any SI/HI AVH  patient  denies depression or anxiety , encouraged coping skills , patient is monitored every 15 minutes for safety no distress.   Problem: Education: Goal: Ability to state activities that reduce stress will improve Outcome: Progressing   Problem: Self-Concept: Goal: Ability to identify factors that promote anxiety will improve Outcome: Progressing Goal: Level of anxiety will decrease Outcome: Progressing Goal: Ability to modify response to factors that promote anxiety will improve Outcome: Progressing   Problem: Activity: Goal: Interest or engagement in leisure activities will improve Outcome: Progressing Goal: Imbalance in normal sleep/wake cycle will improve Outcome: Progressing   Problem: Coping: Goal: Coping ability will improve Outcome: Progressing Goal: Will verbalize feelings Outcome: Progressing   Problem: Health Behavior/Discharge Planning: Goal: Ability to make decisions will improve Outcome: Progressing Goal: Compliance with therapeutic regimen will improve Outcome: Progressing   Problem: Safety: Goal: Ability to disclose and discuss suicidal ideas will improve Outcome: Progressing Goal: Ability to identify and utilize support systems that promote safety will improve Outcome: Progressing   Problem: Education: Goal: Will be free of psychotic symptoms Outcome: Progressing Goal: Knowledge of the prescribed therapeutic regimen will improve Outcome: Progressing   Problem: Health Behavior/Discharge Planning: Goal: Compliance with prescribed  medication regimen will improve Outcome: Progressing   Problem: Self-Care: Goal: Ability to participate in self-care as condition permits will improve Outcome: Progressing

## 2018-11-15 ENCOUNTER — Telehealth: Payer: Self-pay | Admitting: Radiology

## 2018-11-15 ENCOUNTER — Telehealth: Payer: Self-pay | Admitting: Physician Assistant

## 2018-11-15 NOTE — Telephone Encounter (Signed)
Pt just release from hospital. Need to speak with nurse ASAP concerning med regimen hospital verses TH regimen.

## 2018-11-15 NOTE — Telephone Encounter (Signed)
Spoke with patient about colpo which Dr Harolyn Rutherford suggested back in November of 2019. Patient has lost insurance so I gave her information for Rolena Infante for the [facility] program to see about financial assistance for procedure. Patient understands to call back with information regarding having the procedure done .

## 2018-11-15 NOTE — Telephone Encounter (Signed)
Ok

## 2018-11-23 ENCOUNTER — Telehealth: Payer: Self-pay | Admitting: Physician Assistant

## 2018-11-23 NOTE — Telephone Encounter (Signed)
LM on 11/23/18 at 2:05 pm, that I had called and will try again later.

## 2018-11-23 NOTE — Telephone Encounter (Signed)
3:50 pm.  I spoke w/ Raymond Gurney at [facility].  She told me that there is an ongoing investigation concerning Kelly Carr's wellbeing and wanted to know if I had any concerns.  My only concern is her lack of transportation.  Because of that, she misses appointments for medication management, but most importantly she is missing appointments for the Abilify Maintena injections.  If she could make it to the office to have those shots monthly, I believe she would be doing much better mentally and hopefully not need to be admitted to the hospital at all, or at least not as often.

## 2018-11-23 NOTE — Telephone Encounter (Signed)
Raymond Gurney from [place] [facility] [employer] called. Ask you to return call @ [phone]

## 2018-11-26 ENCOUNTER — Telehealth: Payer: Self-pay

## 2018-11-26 NOTE — Telephone Encounter (Signed)
Patient would like to come on Wed. Instead of Thurs for her injection.  Please advise if this is okay, she will call back tomorrow to confirm

## 2018-11-26 NOTE — Telephone Encounter (Signed)
Yes that's fine if someone can get her scheduled

## 2018-11-28 ENCOUNTER — Ambulatory Visit: Payer: Self-pay

## 2018-11-29 ENCOUNTER — Ambulatory Visit: Payer: Self-pay

## 2018-11-30 ENCOUNTER — Other Ambulatory Visit: Payer: Self-pay

## 2018-11-30 ENCOUNTER — Ambulatory Visit (INDEPENDENT_AMBULATORY_CARE_PROVIDER_SITE_OTHER): Payer: Self-pay

## 2018-11-30 DIAGNOSIS — F319 Bipolar disorder, unspecified: Secondary | ICD-10-CM

## 2018-11-30 NOTE — Progress Notes (Signed)
Pt here for monthly Abilify Maintena 400 mg injection, last one was while she was admitted on the inpatient unit. Pt received injection in left gluteus max. Tolerated well, no complaints. 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 She has lost a significant amount of weight and looks great, she mentioned she's exercising and eating healthier. Pt has follow up with provider soon. Pt will need to set up next appt for July in 4 weeks around July 24th. Pt aware.    Lot [id number] Exp Feb 2022

## 2018-12-12 ENCOUNTER — Encounter: Payer: Self-pay | Admitting: Physician Assistant

## 2018-12-12 ENCOUNTER — Ambulatory Visit (INDEPENDENT_AMBULATORY_CARE_PROVIDER_SITE_OTHER): Payer: Self-pay | Admitting: Physician Assistant

## 2018-12-12 DIAGNOSIS — F3110 Bipolar disorder, current episode manic without psychotic features, unspecified: Secondary | ICD-10-CM

## 2018-12-12 DIAGNOSIS — F4310 Post-traumatic stress disorder, unspecified: Secondary | ICD-10-CM

## 2018-12-12 DIAGNOSIS — F411 Generalized anxiety disorder: Secondary | ICD-10-CM

## 2018-12-12 DIAGNOSIS — G4700 Insomnia, unspecified: Secondary | ICD-10-CM

## 2018-12-12 MED ORDER — TRAZODONE HCL 100 MG PO TABS: 100.0000 mg | ORAL_TABLET | Freq: Every evening | ORAL | 1 refills | Status: DC | PRN

## 2018-12-12 MED ORDER — HYDROXYZINE HCL 25 MG PO TABS: 25.0000 mg | ORAL_TABLET | Freq: Three times a day (TID) | ORAL | 1 refills | Status: DC | PRN
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
## 2018-12-27 ENCOUNTER — Ambulatory Visit (INDEPENDENT_AMBULATORY_CARE_PROVIDER_SITE_OTHER): Payer: Self-pay

## 2018-12-27 ENCOUNTER — Other Ambulatory Visit: Payer: Self-pay

## 2018-12-27 DIAGNOSIS — F3163 Bipolar disorder, current episode mixed, severe, without psychotic features: Secondary | ICD-10-CM

## 2018-12-28 ENCOUNTER — Telehealth: Payer: Self-pay

## 2018-12-28 NOTE — Telephone Encounter (Signed)
Pt. Did not answer so I left her a VM to return my call.

## 2018-12-28 NOTE — Telephone Encounter (Signed)
Pt was at the office for her injection inquiring about samples of trazodone and vistaril. Explained those medications are generic and we do not sample. Pt says she can't afford, asked her if she checked on [place] and pt said yes and she still couldn't afford. After pt left nurse pulled up trazodone 100 mg #30 which runs $4-6 dollars at [facility] and Vistaril 25 mg #60 is $5-7 at [facility] or around $8 at [facility]. Will contact patient to let her know very cheap through [facility].   Albina Billet, please contact patient with information to assist her. If she needs updated rx's sent to different pharmacy let me know.

## 2018-12-28 NOTE — Progress Notes (Signed)
Pt here for monthly Abilify Maintena 400 mg injection in left deltoid. Tolerated well, no complaints. Pt appears to be doing well. Pt will need to set up next injection for 28 days, around August 20th.  LOT [id number] EXP FEB 2022

## 2018-12-31 NOTE — Telephone Encounter (Signed)
Pt. Returned call and thinks this is a great option. Please send updated Rx to [facility] on [address]. In [place].

## 2019-01-01 ENCOUNTER — Other Ambulatory Visit: Payer: Self-pay

## 2019-01-01 NOTE — Telephone Encounter (Signed)
Will submit both Rx's for her

## 2019-01-03 ENCOUNTER — Telehealth: Payer: Self-pay | Admitting: Physician Assistant

## 2019-01-03 MED ORDER — HYDROXYZINE HCL 25 MG PO TABS: 25.0000 mg | ORAL_TABLET | Freq: Three times a day (TID) | ORAL | 1 refills | Status: DC | PRN

## 2019-01-03 MED ORDER — TRAZODONE HCL 100 MG PO TABS: 100.0000 mg | ORAL_TABLET | Freq: Every evening | ORAL | 1 refills | Status: DC | PRN

## 2019-01-03 NOTE — Telephone Encounter (Signed)
I spoke with Kelly Carr at above phone number, who wanted to make sure there are no concerns that I have for Kelly Carr.  My only concern has been lack of transportation for her which has prevented her from getting the monthly Abilify maintena.  That seems to have resolved however as the patient has been in for the past couple of months for the injection.  When I did an E visit about 3 weeks ago, she was doing really well.  Kelly Carr states she talked with her yesterday and the patient is staying with a friend, is using that friends vehicle as needed and is working about 30 hours a week at [employer].  She seems to be doing great.  Kelly Carr gave her information concerning [facility] in [place], if Flordia chooses to go to a local place for her psychiatric care and monthly injection.  For now, Kelly Carr states that she wants to continue care at [facility].  I will discuss with Kelly Carr at her next visit.  I would hate to lose her as a patient however I completely understand if she needs to go to a local provider and I want what is best for her.  The most important thing is for her to continue the monthly injections.  She has responded very well and this will help keep her out of the hospital.

## 2019-01-03 NOTE — Telephone Encounter (Signed)
Milus Glazier with [facility] is working with Alianny and would like to speak with you about her case.  Please call her [phone]

## 2019-01-17 ENCOUNTER — Ambulatory Visit: Payer: Self-pay

## 2019-01-18 ENCOUNTER — Other Ambulatory Visit: Payer: Self-pay

## 2019-01-18 ENCOUNTER — Ambulatory Visit (INDEPENDENT_AMBULATORY_CARE_PROVIDER_SITE_OTHER): Payer: Self-pay | Admitting: Family Medicine

## 2019-01-18 VITALS — BP 118/60 | HR 68

## 2019-01-18 DIAGNOSIS — Z20822 Contact with and (suspected) exposure to covid-19: Secondary | ICD-10-CM

## 2019-01-18 DIAGNOSIS — N926 Irregular menstruation, unspecified: Secondary | ICD-10-CM

## 2019-01-18 DIAGNOSIS — N3000 Acute cystitis without hematuria: Secondary | ICD-10-CM

## 2019-01-18 DIAGNOSIS — R399 Unspecified symptoms and signs involving the genitourinary system: Secondary | ICD-10-CM

## 2019-01-18 DIAGNOSIS — Z20828 Contact with and (suspected) exposure to other viral communicable diseases: Secondary | ICD-10-CM

## 2019-01-18 DIAGNOSIS — N390 Urinary tract infection, site not specified: Secondary | ICD-10-CM | POA: Insufficient documentation

## 2019-01-18 LAB — POCT URINALYSIS DIP (MANUAL ENTRY)
Bilirubin, UA: NEGATIVE
Blood, UA: NEGATIVE
Glucose, UA: NEGATIVE mg/dL
Ketones, POC UA: NEGATIVE mg/dL
Nitrite, UA: NEGATIVE
Protein Ur, POC: NEGATIVE mg/dL
Spec Grav, UA: 1.02 (ref 1.010–1.025)
Urobilinogen, UA: 0.2 E.U./dL
pH, UA: 7 (ref 5.0–8.0)

## 2019-01-18 LAB — POCT UA - MICROSCOPIC ONLY

## 2019-01-18 LAB — POCT URINE PREGNANCY: Preg Test, Ur: NEGATIVE

## 2019-01-18 MED ORDER — CEPHALEXIN 500 MG PO CAPS: 500.0000 mg | ORAL_CAPSULE | Freq: Four times a day (QID) | ORAL | 0 refills | Status: AC

## 2019-01-18 NOTE — Progress Notes (Signed)
   Subjective:    Patient ID: Kelly Carr, female    DOB: 20-May-1971, 48 y.o.   MRN: [id number]   CC: "UTI Symptoms"  HPI: Patient presenting with 4 days of burning with urination, frequency and urinary urgency. Pain with urination is worse towards her emptying her bladder. Is a sexually active individual, has same steady partner, no suspicion of STI. No discharge, foul vaginal odor or dyspareunia. Denies external vaginal itch/burn. Has not tried anything that helps her symptoms. Denies fevers, HA, chest pain, SOB, N, V, constipation and diarrhea.   Smoking status reviewed: daily smoker  Review of Systems: see HPI   Objective:  BP 118/60   Pulse 68   LMP 11/28/2018 (Exact Date)   SpO2 99%  Vitals and nursing note reviewed  General: well nourished, in no acute distress Neck: supple, non-tender, no LAD Cardiac: RRR, clear S1 and S2, no murmurs appreciated Respiratory: CTA bilaterally, no increased work of breathing Abdomen: soft, nontender to palpation, Bowel sounds present Skin: warm and dry, no rashes noted Neuro: alert and oriented, no focal deficits  Assessment & Plan:   UTI (urinary tract infection) Patient UA results pending but have +LE, symptoms suggestive of UA -Instructed patient to clean bottom from front to back -Encouraged her to urinate after sex -Keflex QID x5 days -Told to return should she develop fever, malaise, abdominal pains   Return if symptoms worsen or fail to improve.   Dr. Milus Banister [facility], PGY-2

## 2019-01-18 NOTE — Patient Instructions (Signed)
Thank you for coming in to see [place] today! Please see below to review our plan for today's visit:  1. Go to old [facility]) at [address] [place] 2. Take Keflex 4 times daily for 5 days.    Please call the clinic at [phone] if your symptoms worsen or you have any concerns. It was our pleasure to serve you!    Dr. Milus Banister [facility]

## 2019-01-18 NOTE — Assessment & Plan Note (Signed)
Patient UA results pending but have +LE, symptoms suggestive of UA -Instructed patient to clean bottom from front to back -Encouraged her to urinate after sex -Keflex QID x5 days -Told to return should she develop fever, malaise, abdominal pains

## 2019-01-18 NOTE — Addendum Note (Signed)
Addended by: [place] Pink on: 01/18/2019 11:23 AM   Modules accepted: Orders

## 2019-01-20 LAB — URINE CULTURE

## 2019-01-20 LAB — NOVEL CORONAVIRUS, NAA: SARS-CoV-2, NAA: NOT DETECTED

## 2019-01-30 ENCOUNTER — Ambulatory Visit (INDEPENDENT_AMBULATORY_CARE_PROVIDER_SITE_OTHER): Payer: Self-pay

## 2019-01-30 ENCOUNTER — Other Ambulatory Visit: Payer: Self-pay

## 2019-01-30 DIAGNOSIS — F3112 Bipolar disorder, current episode manic without psychotic features, moderate: Secondary | ICD-10-CM

## 2019-01-30 NOTE — Progress Notes (Signed)
Pt here for her monthly Abilify Maintena 400 mg IM left gluteal. Tolerated well, no complaints. Pt doing well, still working at [employer]. Pt due for next injection on 02/27/2019.  LOT [id number] EXP Feb 2022

## 2019-02-15 ENCOUNTER — Ambulatory Visit (INDEPENDENT_AMBULATORY_CARE_PROVIDER_SITE_OTHER): Payer: Self-pay | Admitting: Family Medicine

## 2019-02-15 ENCOUNTER — Other Ambulatory Visit: Payer: Self-pay

## 2019-02-15 ENCOUNTER — Encounter: Payer: Self-pay | Admitting: Family Medicine

## 2019-02-15 VITALS — BP 100/62 | HR 82

## 2019-02-15 DIAGNOSIS — M25511 Pain in right shoulder: Secondary | ICD-10-CM

## 2019-02-15 MED ORDER — CYCLOBENZAPRINE HCL 10 MG PO TABS: 10.0000 mg | ORAL_TABLET | Freq: Three times a day (TID) | ORAL | 0 refills | Status: AC | PRN

## 2019-02-15 NOTE — Patient Instructions (Signed)
It was a pleasure to see you today! Thank you for choosing [facility] for your primary care. Kelly Carr was seen for shoulder pain. Come back to the clinic in 2 weeks of this is not improving.  Today we discussed your right shoulder pain, it looks like you are having either muscle spasm or had a minor muscle injury.  Do not think this is an emergency situation and should be able to calm down over the next week or 2 with some ice, rest if possible, and ibuprofen.  Were also going to send in 10 days of Flexeril to help calm down the spasming effect that you are having.  This is not to be a long-term medication.  Please take this the first dose at home at night because it can make some people drowsy and you want to know how it affects you.   Please bring all your medications to every doctors visit   Sign up for My Chart to have easy access to your labs results, and communication with your Primary care physician.     Please check-out at the front desk before leaving the clinic.     Best,  Dr. Sherene Sires FAMILY MEDICINE RESIDENT - PGY3 02/15/2019 3:12 PM

## 2019-02-19 NOTE — Progress Notes (Signed)
    Subjective:  Kelly Carr is a 48 y.o. female who presents to the [facility] today with a chief complaint of acute right shoulder pain.   HPI: Right shoulder pain Patient with new right shoulder pain over the last couple days, says that she has had no recent injuries and no specific moment when she thinks that it got hurt.  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   Objective:  Physical Exam: BP 100/62   Pulse 82   SpO2 97%   Gen: NAD, conversing comfortably CV: RRR with no murmurs appreciated Pulm: NWOB, CTAB with no crackles, wheezes, or rhonchi MSK: **Right shoulder with no bony dysfunction noted, no erythema or sign of infection.  Hypertonicity in the muscles indicating muscular injury versus muscle spasm.   Skin: warm, dry Neuro: grossly normal, moves all extremities Psych: Normal affect and thought content  No results found for this or any previous visit (from the past 72 hour(s)).   Assessment/Plan:  Right shoulder pain Patient with new right shoulder pain over the last couple days, says that she has had no recent injuries and no specific moment when she thinks that it got hurt.  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  Physical exam is reassuring for no bony injury or specific rotator cuff injury, appears to be hypertonicity in right shoulder muscles.  Patient instructed to try NSAIDs over-the-counter as well as ice and a short course of Flexeril.  We specifically went over this would not be a long-term medication.  She should start this medication at home in case it has drowsy effects.   Sherene Sires, DO FAMILY MEDICINE RESIDENT - PGY3 02/19/2019 8:36 AM

## 2019-02-19 NOTE — Assessment & Plan Note (Signed)
Patient with new right shoulder pain over the last couple days, says that she has had no recent injuries and no specific moment when she thinks that it got hurt.  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  Physical exam is reassuring for no bony injury or specific rotator cuff injury, appears to be hypertonicity in right shoulder muscles.  Patient instructed to try NSAIDs over-the-counter as well as ice and a short course of Flexeril.  We specifically went over this would not be a long-term medication.  She should start this medication at home in case it has drowsy effects.

## 2019-02-22 ENCOUNTER — Ambulatory Visit: Payer: Self-pay | Admitting: Physician Assistant

## 2019-02-27 ENCOUNTER — Ambulatory Visit: Payer: Self-pay

## 2019-02-27 ENCOUNTER — Ambulatory Visit (INDEPENDENT_AMBULATORY_CARE_PROVIDER_SITE_OTHER): Payer: Self-pay

## 2019-02-27 ENCOUNTER — Other Ambulatory Visit: Payer: Self-pay

## 2019-02-27 DIAGNOSIS — F3112 Bipolar disorder, current episode manic without psychotic features, moderate: Secondary | ICD-10-CM

## 2019-02-28 NOTE — Progress Notes (Signed)
Pt here for her monthly Abilify Maintena 400 mg IM left gluteal. Tolerated well, no complaints. Pt doing well. Pt due for next injection on 03/27/2019.  LOT [id number] EXP Feb 2022

## 2019-03-22 ENCOUNTER — Ambulatory Visit: Payer: Self-pay | Admitting: Physician Assistant

## 2019-03-22 ENCOUNTER — Other Ambulatory Visit: Payer: Self-pay

## 2019-03-26 ENCOUNTER — Other Ambulatory Visit: Payer: Self-pay

## 2019-03-26 ENCOUNTER — Ambulatory Visit (INDEPENDENT_AMBULATORY_CARE_PROVIDER_SITE_OTHER): Payer: Self-pay

## 2019-03-26 DIAGNOSIS — F3110 Bipolar disorder, current episode manic without psychotic features, unspecified: Secondary | ICD-10-CM

## 2019-03-26 NOTE — Progress Notes (Signed)
Pt here for her monthly Abilify Maintena 400 mg IM left gluteal. Given by Perley Jain, CMA, observed by nurse.Tolerated well, no complaints. Pt doing well. Pt due for next injection on 04/23/2019  LOT [id number] EXP JUL 2022

## 2019-04-18 ENCOUNTER — Other Ambulatory Visit: Payer: Self-pay

## 2019-04-18 ENCOUNTER — Ambulatory Visit: Payer: Self-pay | Admitting: Physician Assistant

## 2019-04-22 ENCOUNTER — Other Ambulatory Visit: Payer: Self-pay

## 2019-04-22 ENCOUNTER — Ambulatory Visit (INDEPENDENT_AMBULATORY_CARE_PROVIDER_SITE_OTHER): Payer: Self-pay

## 2019-04-22 DIAGNOSIS — F3162 Bipolar disorder, current episode mixed, moderate: Secondary | ICD-10-CM

## 2019-04-22 NOTE — Progress Notes (Signed)
Pt here for her monthly Abilify Maintena 400 mg IM left gluteal. Tolerated well, no complaints. Pt doing very well, thoughts are good and sleeping well.Pt due for next injection on12/15/2020 also has her next office visit with Helene Kelp then.   LOT [id number] EXP FEB 2022

## 2019-05-21 ENCOUNTER — Ambulatory Visit: Payer: Self-pay

## 2019-05-21 ENCOUNTER — Encounter: Payer: Self-pay | Admitting: Physician Assistant

## 2019-05-21 ENCOUNTER — Ambulatory Visit (INDEPENDENT_AMBULATORY_CARE_PROVIDER_SITE_OTHER): Payer: Self-pay | Admitting: Physician Assistant

## 2019-05-21 DIAGNOSIS — F3160 Bipolar disorder, current episode mixed, unspecified: Secondary | ICD-10-CM

## 2019-05-21 DIAGNOSIS — F411 Generalized anxiety disorder: Secondary | ICD-10-CM
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
## 2019-05-24 ENCOUNTER — Other Ambulatory Visit: Payer: Self-pay

## 2019-05-24 ENCOUNTER — Ambulatory Visit (INDEPENDENT_AMBULATORY_CARE_PROVIDER_SITE_OTHER): Payer: Self-pay

## 2019-05-24 DIAGNOSIS — F3162 Bipolar disorder, current episode mixed, moderate: Secondary | ICD-10-CM

## 2019-05-24 NOTE — Progress Notes (Signed)
Pt here for her monthly Abilify Maintena 400 mg IM right gluteal.Tolerated well, no complaints. Pt doing very well, thoughts are good and sleeping well. Patient says she is moving in two weeks and very excited.Pt due for next injection on01/15/2021.  LOT [id number]

## 2019-06-28 ENCOUNTER — Ambulatory Visit (INDEPENDENT_AMBULATORY_CARE_PROVIDER_SITE_OTHER): Payer: Self-pay

## 2019-06-28 ENCOUNTER — Other Ambulatory Visit: Payer: Self-pay

## 2019-06-28 DIAGNOSIS — F3162 Bipolar disorder, current episode mixed, moderate: Secondary | ICD-10-CM

## 2019-06-28 NOTE — Progress Notes (Signed)
Pt here for her monthly Abilify Maintena 400 mg IM left gluteal.Tolerated well, no complaints. Pt doingverywell, thoughts are good and sleeping well.Pt due for next injection on02/15/2021.    LOT [id number] EXP 12/2020

## 2019-07-29 ENCOUNTER — Telehealth: Payer: Self-pay | Admitting: Physician Assistant

## 2019-07-29 ENCOUNTER — Other Ambulatory Visit: Payer: Self-pay

## 2019-07-29 NOTE — Telephone Encounter (Signed)
Can she get in any earlier for her shot?  If not, lets give her Seroquel 100 mg 1 qhs for 1 night then 2 qhs.

## 2019-07-29 NOTE — Telephone Encounter (Signed)
Trying to reach Gracilyn for sooner apt for injection and also get her pharmacy

## 2019-07-29 NOTE — Telephone Encounter (Signed)
Kelly Carr called to report that she is not sleeping.  Under a lot of pressure at work and having a lot of anxiety.  Is trying to catch her going into a episode before it happens.  Has appt with you 08/19/19 but can't wait until then.  Please call to discuss how she avert an episode until she gets in for appt.  Call after 1:00pm

## 2019-07-30 ENCOUNTER — Ambulatory Visit: Payer: Self-pay

## 2019-07-30 ENCOUNTER — Other Ambulatory Visit: Payer: Self-pay

## 2019-07-30 ENCOUNTER — Ambulatory Visit (INDEPENDENT_AMBULATORY_CARE_PROVIDER_SITE_OTHER): Payer: Self-pay | Admitting: Physician Assistant

## 2019-07-30 DIAGNOSIS — G4700 Insomnia, unspecified: Secondary | ICD-10-CM

## 2019-07-30 DIAGNOSIS — F3160 Bipolar disorder, current episode mixed, unspecified: Secondary | ICD-10-CM

## 2019-08-01 ENCOUNTER — Ambulatory Visit: Payer: Self-pay

## 2019-08-01 ENCOUNTER — Encounter: Payer: Self-pay | Admitting: Physician Assistant

## 2019-08-01 NOTE — Progress Notes (Signed)
[facility]  Patient ID: Kelly Carr,  MRN: [id number]  PCP: Leeroy Bock, DO  Date of Evaluation: 07/30/2019 Time spent:20 minutes  Chief Complaint:  Chief Complaint    Insomnia; Other      HISTORY/CURRENT STATUS: HPI here for monthly injection of Abilify Maintena.  Paidyn called yesterday reporting that she was not sleeping and we were concerned that she may be going into a manic phase.  She is just a few days overdue for her monthly injection of Abilify.  She reports that she has been under a lot of pressure at work, her hours have been cut from 40/week to 15/week.  States this can happen this time of year but it still causing her stress.  She was also having some car problems.  I had originally recommended adding Seroquel until she could get in for her injection but she preferred not to take that but get in for her shot as soon as possible.  States she had been doing well until the past few days.  She has been able to enjoy things, energy and motivation have been good.  She had been sleeping okay.  No suicidal or homicidal thoughts.  No increased energy with decreased need for sleep.  No impulsivity or risky behavior, no grandiosity, no increased libido or spending.  No gambling.  No hallucinations.  Denies dizziness, syncope, seizures, numbness, tingling, tremor, tics, unsteady gait, slurred speech, confusion. Denies muscle or joint pain, stiffness, or dystonia.  Individual Medical History/ Review of Systems: Changes? :No    Past medications for mental health diagnoses include: Gabapentin, Ativan, BuSpar, Wellbutrin, Latuda, trazodone, Cogentin, Geodon, Trileptal, Vraylar, prazosin, Vistaril, Abilify maintainena,Abilify  Allergies: Haldol [haloperidol lactate], Risperidone and related, Tramadol, and Valproic acid  Current Medications:  Current Outpatient Medications:  .  ARIPiprazole ER (ABILIFY MAINTENA) 400 MG SRER injection, Inject 2 mLs (400 mg total)  into the muscle every 28 (twenty-eight) days., Disp: 1 each, Rfl: 1 Medication Side Effects: none  Family Medical/ Social History: Changes? No  MENTAL HEALTH EXAM:  There were no vitals taken for this visit.There is no height or weight on file to calculate BMI.  General Appearance: Casual, Neat and Well Groomed  Eye Contact:  Good  Speech:  Clear and Coherent  Volume:  Normal  Mood:  Euthymic  Affect:  Appropriate  Thought Process:  Goal Directed and Descriptions of Associations: Intact  Orientation:  Full (Time, Place, and Person)  Thought Content: Logical   Suicidal Thoughts:  No  Homicidal Thoughts:  No  Memory:  WNL  Judgement:  Good  Insight:  Good  Psychomotor Activity:  Normal  Concentration:  Concentration: Good  Recall:  Good  Fund of Knowledge: Good  Language: Good  Assets:  Desire for Improvement  ADL's:  Intact  Cognition: WNL  Prognosis:  Good    DIAGNOSES:    ICD-10-CM   1. Mixed bipolar I disorder (HCC)  F31.60   2. Insomnia, unspecified type  G47.00     Receiving Psychotherapy: No    RECOMMENDATIONS:  I spent 20 minutes with her. Abilify Maintena 400 mg IM was given in left gluteal muscle.  She had no complaints, tolerated the injection well. She knows the signs and symptoms to watch for and if any of the symptoms of early mania occur, call immediately and we will do our best to prevent them from worsening. She has an appointment scheduled with me within the next few weeks and will keep that appointment.  Continue monthly Abilify Maintena injections.   [name], PA-C

## 2019-08-08 ENCOUNTER — Observation Stay (HOSPITAL_COMMUNITY)
Admission: RE | Admit: 2019-08-08 | Discharge: 2019-08-09 | Disposition: A | Discharge status: Home / Self Care | Payer: Self-pay | Attending: Psychiatry | Admitting: Psychiatry

## 2019-08-08 DIAGNOSIS — Z885 Allergy status to narcotic agent status: Secondary | ICD-10-CM | POA: Insufficient documentation

## 2019-08-08 DIAGNOSIS — Z79899 Other long term (current) drug therapy: Secondary | ICD-10-CM | POA: Insufficient documentation

## 2019-08-08 DIAGNOSIS — Z20822 Contact with and (suspected) exposure to covid-19: Secondary | ICD-10-CM | POA: Insufficient documentation

## 2019-08-08 DIAGNOSIS — Z818 Family history of other mental and behavioral disorders: Secondary | ICD-10-CM | POA: Insufficient documentation

## 2019-08-08 DIAGNOSIS — Z833 Family history of diabetes mellitus: Secondary | ICD-10-CM | POA: Insufficient documentation

## 2019-08-08 DIAGNOSIS — M1990 Unspecified osteoarthritis, unspecified site: Secondary | ICD-10-CM | POA: Insufficient documentation

## 2019-08-08 DIAGNOSIS — F319 Bipolar disorder, unspecified: Principal | ICD-10-CM | POA: Diagnosis present

## 2019-08-08 DIAGNOSIS — J45909 Unspecified asthma, uncomplicated: Secondary | ICD-10-CM | POA: Insufficient documentation

## 2019-08-08 DIAGNOSIS — F419 Anxiety disorder, unspecified: Secondary | ICD-10-CM | POA: Insufficient documentation

## 2019-08-08 DIAGNOSIS — Z888 Allergy status to other drugs, medicaments and biological substances status: Secondary | ICD-10-CM | POA: Insufficient documentation

## 2019-08-08 DIAGNOSIS — Z8249 Family history of ischemic heart disease and other diseases of the circulatory system: Secondary | ICD-10-CM | POA: Insufficient documentation

## 2019-08-08 LAB — RESPIRATORY PANEL BY RT PCR (FLU A&B, COVID)
Influenza A by PCR: NEGATIVE
Influenza B by PCR: NEGATIVE
SARS Coronavirus 2 by RT PCR: NEGATIVE
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
## 2019-08-09 ENCOUNTER — Encounter (HOSPITAL_COMMUNITY): Payer: Self-pay | Admitting: Behavioral Health

## 2019-08-09 ENCOUNTER — Other Ambulatory Visit: Payer: Self-pay

## 2019-08-09 DIAGNOSIS — F319 Bipolar disorder, unspecified: Secondary | ICD-10-CM

## 2019-08-09 LAB — HEPATIC FUNCTION PANEL
ALT: 18 U/L (ref 0–44)
AST: 19 U/L (ref 15–41)
Albumin: 3.7 g/dL (ref 3.5–5.0)
Alkaline Phosphatase: 58 U/L (ref 38–126)
Bilirubin, Direct: 0.1 mg/dL (ref 0.0–0.2)
Indirect Bilirubin: 0.5 mg/dL (ref 0.3–0.9)
Total Bilirubin: 0.6 mg/dL (ref 0.3–1.2)
Total Protein: 6.6 g/dL (ref 6.5–8.1)

## 2019-08-09 LAB — COMPREHENSIVE METABOLIC PANEL
ALT: 18 U/L (ref 0–44)
AST: 20 U/L (ref 15–41)
Albumin: 3.7 g/dL (ref 3.5–5.0)
Alkaline Phosphatase: 58 U/L (ref 38–126)
Anion gap: 7 (ref 5–15)
BUN: 10 mg/dL (ref 6–20)
CO2: 24 mmol/L (ref 22–32)
Calcium: 8.7 mg/dL — ABNORMAL LOW (ref 8.9–10.3)
Chloride: 107 mmol/L (ref 98–111)
Creatinine, Ser: 0.61 mg/dL (ref 0.44–1.00)
GFR calc Af Amer: >60 mL/min (ref >60)
GFR calc non Af Amer: >60 mL/min (ref >60)
Glucose, Bld: 105 mg/dL — ABNORMAL HIGH (ref 70–99)
Potassium: 3.7 mmol/L (ref 3.5–5.1)
Sodium: 138 mmol/L (ref 135–145)
Total Bilirubin: 0.5 mg/dL (ref 0.3–1.2)
Total Protein: 6.6 g/dL (ref 6.5–8.1)

## 2019-08-09 LAB — TSH: TSH: 1.966 u[IU]/mL (ref 0.350–4.500)

## 2019-08-09 LAB — LIPID PANEL
Cholesterol: 202 mg/dL — ABNORMAL HIGH (ref 0–200)
HDL: 50 mg/dL (ref >40)
LDL Cholesterol: 136 mg/dL — ABNORMAL HIGH (ref 0–99)
Total CHOL/HDL Ratio: 4 RATIO
Triglycerides: 80 mg/dL (ref <150)
VLDL: 16 mg/dL (ref 0–40)

## 2019-08-09 LAB — HEMOGLOBIN A1C
Hgb A1c MFr Bld: 5.2 % (ref 4.8–5.6)
Mean Plasma Glucose: 102.54 mg/dL

## 2019-08-09 LAB — ETHANOL: Alcohol, Ethyl (B): <10 mg/dL (ref <10)

## 2019-08-09 LAB — MAGNESIUM: Magnesium: 2.2 mg/dL (ref 1.7–2.4)

## 2019-08-09 MED ORDER — ACETAMINOPHEN 325 MG PO TABS: 650.0000 mg | ORAL_TABLET | Freq: Four times a day (QID) | ORAL | Status: DC | PRN

## 2019-08-09 NOTE — BH Assessment (Addendum)
[facility] Assessment Progress Note  Per Nehemiah Massed, MD, this pt does not require psychiatric hospitalization at this time.  Pt presents under IVC initiated by pt's son, which Dr Jama Flavors has rescinded.  Pt is to be discharged from the [facility] Observation Unit with recommendation to continue treatment with her current outpatient provider, Melony Overly, PA.  Pt's next appointment is scheduled for Monday, 08/19/2019 at 14:00.  This has been included in pt's discharge instructions.  Pt's nurse, Erin Fulling, has been notified.  Doylene Canning, MA Triage Specialist [phone]

## 2019-08-09 NOTE — H&P (Addendum)
[facility] Observation Unit Provider Admission PAA/H&P  Patient Identification: Arnell Blaney MRN:  [id number] Date of Evaluation:  08/09/2019 Chief Complaint:  IVC Principal Diagnosis: Bipolar disorder, unspecified (HCC) Diagnosis:  Principal Problem:   Bipolar disorder, unspecified (HCC)  History of Present Illness:   Hiral Jasinski is a 49 y.o. who presents to [facility] IVC'd by her son brought in by [employer].  Per IVC:  "patient threats to hurt people has not slept in 4 days, has Bipolar and has been having hallucinations.  Pt denies everything on the IVC. Pt reports her children are worried about her chaotic relationship with her partner who is suicidal. Pt reports she has been praying for her partner and as been playing music/singing to make herself feel better about the situation. Pt reports she is also stressed about work, job and is interviewing about a new job. Pt reports her son is upset she bought a car yesterday, and has been saying hurtful things to her and saying her car will get repossessed. Pt states, "my kids expect me to live the way they want but I am an adult". Pt denies any depressive symptoms. Pt denies SI, and HI. She reports a history of self harm, AVH and suicidal attempt when she was younger. Pt states she does not see a therapist, she sees a psychiatrist [name] at [facility]. Pt states she gets Abilify shots and her next appointment is on March 15th. Pt states she sleeps 5-7 hours a day and her appetite is good.   During evaluation pt was sitting; she is alert/oriented x 4; cooperative; and mood is anxious congruent with affect. Pt is speaking in a clear tone at moderate volume, and normal pace; with good eye contact. Her thought process is coherent and relevant; There is no indication that she is currently responding to internal/external stimuli or experiencing delusional thought content. Patient  has answered questions appropriately.     Associated  Signs/Symptoms: Depression Symptoms:  anxiety, (Hypo) Manic Symptoms:  Impulsivity, Anxiety Symptoms:  Excessive Worry, Psychotic Symptoms:  NA PTSD Symptoms: NA Total Time spent with patient: 30 minutes  Past Psychiatric History: Yes  Is the patient at risk to self? No.  Has the patient been a risk to self in the past 6 months? No.  Has the patient been a risk to self within the distant past? No.  Is the patient a risk to others? No.  Has the patient been a risk to others in the past 6 months? No.  Has the patient been a risk to others within the distant past? No.   Prior Inpatient Therapy: Prior Inpatient Therapy: Yes Prior Therapy Dates: (report to many to count) Prior Therapy Facilty/Provider(s): [facility] Reason for Treatment: mental health  Prior Outpatient Therapy: Prior Outpatient Therapy: Yes Prior Therapy Dates: report seen a week and half ago Prior Therapy Facilty/Provider(s): Melony Overly at [facility] Reason for Treatment: medication management  Does patient have an ACCT team?: No Does patient have Intensive In-House Services?  : No Does patient have [facility] services? : No Does patient have P4CC services?: No  Alcohol Screening:   Substance Abuse History in the last 12 months:  No. Consequences of Substance Abuse: NA Previous Psychotropic Medications: Yes  Psychological Evaluations: Yes  Past Medical History:  Past Medical History:  Diagnosis Date  . Abnormal Pap smear    Unknown results>colpo>normal  . Anxiety   . Arthritis   . Asthma   . Bipolar 1 disorder (HCC)   . Depression  Past Surgical History:  Procedure Laterality Date  . NO PAST SURGERIES     Family History:  Family History  Problem Relation Age of Onset  . Diabetes Mother   . Hypertension Mother   . Heart disease Mother 10  . Schizophrenia Mother   . Diabetes Maternal Grandmother   . Heart disease Maternal Grandmother   . Diabetes Maternal Grandfather   . Heart disease Maternal  Grandfather   . Bipolar disorder Cousin   . Bipolar disorder Nephew   . Depression Daughter    Family Psychiatric History: Unknown Tobacco Screening:   Social History:  Social History   Substance and Sexual Activity  Alcohol Use Not Currently     Social History   Substance and Sexual Activity  Drug Use Not Currently    Additional Social History: Marital status: Long term relationship    Pain Medications: see MAR Prescriptions: see MAR Over the Counter: see MAR History of alcohol / drug use?: Yes Name of Substance 1: Alcohol 1 - Last Use / Amount: 08/08/2019 Name of Substance 2: THC                Allergies:   Allergies  Allergen Reactions  . Haldol [Haloperidol Lactate] Other (See Comments)    Syncope   . Risperidone And Related Other (See Comments)    Zones out  . Tramadol Itching  . Valproic Acid    Lab Results:  Results for orders placed or performed during the hospital encounter of 08/08/19 (from the past 48 hour(s))  Respiratory Panel by RT PCR (Flu A&B, Covid) - Nasopharyngeal Swab     Status: None   Collection Time: 08/08/19  9:46 PM   Specimen: Nasopharyngeal Swab  Result Value Ref Range   SARS Coronavirus 2 by RT PCR NEGATIVE NEGATIVE    Comment: (NOTE) SARS-CoV-2 target nucleic acids are NOT DETECTED. The SARS-CoV-2 RNA is generally detectable in upper respiratoy specimens during the acute phase of infection. The lowest concentration of SARS-CoV-2 viral copies this assay can detect is 131 copies/mL. A negative result does not preclude SARS-Cov-2 infection and should not be used as the sole basis for treatment or other patient management decisions. A negative result may occur with  improper specimen collection/handling, submission of specimen other than nasopharyngeal swab, presence of viral mutation(s) within the areas targeted by this assay, and inadequate number of viral copies (<131 copies/mL). A negative result must be combined with  clinical observations, patient history, and epidemiological information. The expected result is Negative. Fact Sheet for Patients:  https://www.moore.com/ Fact Sheet for Healthcare Providers:  https://www.young.biz/ This test is not yet ap proved or cleared by the [place] FDA and  has been authorized for detection and/or diagnosis of SARS-CoV-2 by FDA under an Emergency Use Authorization (EUA). This EUA will remain  in effect (meaning this test can be used) for the duration of the COVID-19 declaration under Section 564(b)(1) of the Act, 21 U.S.C. section 360bbb-3(b)(1), unless the authorization is terminated or revoked sooner.    Influenza A by PCR NEGATIVE NEGATIVE   Influenza B by PCR NEGATIVE NEGATIVE    Comment: (NOTE) The Xpert Xpress SARS-CoV-2/FLU/RSV assay is intended as an aid in  the diagnosis of influenza from Nasopharyngeal swab specimens and  should not be used as a sole basis for treatment. Nasal washings and  aspirates are unacceptable for Xpert Xpress SARS-CoV-2/FLU/RSV  testing. Fact Sheet for Patients: https://www.moore.com/ Fact Sheet for Healthcare Providers: https://www.young.biz/ This test is not yet approved  or cleared by the [place] and  has been authorized for detection and/or diagnosis of SARS-CoV-2 by  FDA under an Emergency Use Authorization (EUA). This EUA will remain  in effect (meaning this test can be used) for the duration of the  Covid-19 declaration under Section 564(b)(1) of the Act, 21  U.S.C. section 360bbb-3(b)(1), unless the authorization is  terminated or revoked. Performed at [facility], [place] [address], [place],  [address]     Blood Alcohol level:  Lab Results  Component Value Date   ETH <10 10/30/2018   ETH <10 25/42/7062    Metabolic Disorder Labs:  Lab Results  Component Value Date   HGBA1C 5.0 10/09/2018    MPG 96.8 10/09/2018   MPG 96.8 03/21/2018   Lab Results  Component Value Date   PROLACTIN 39.5 (H) 04/12/2018   PROLACTIN 2.0 12/05/2011   Lab Results  Component Value Date   CHOL 215 (H) 10/09/2018   TRIG 83 10/09/2018   HDL 85 10/09/2018   CHOLHDL 2.5 10/09/2018   VLDL 17 10/09/2018   LDLCALC 113 (H) 10/09/2018   LDLCALC 118 (H) 03/21/2018    Current Medications: No current facility-administered medications for this encounter.   PTA Medications: Medications Prior to Admission  Medication Sig Dispense Refill Last Dose  . ARIPiprazole ER (ABILIFY MAINTENA) 400 MG SRER injection Inject 2 mLs (400 mg total) into the muscle every 28 (twenty-eight) days. 1 each 1     Musculoskeletal: Strength & Muscle Tone: within normal limits Gait & Station: normal Patient leans: N/A  Psychiatric Specialty Exam: Physical Exam  Constitutional: She is oriented to person, place, and time. She appears well-developed.  HENT:  Head: Normocephalic.  Eyes: Pupils are equal, round, and reactive to light.  Respiratory: Effort normal.  Musculoskeletal:        General: Normal range of motion.     Cervical back: Normal range of motion.  Neurological: She is alert and oriented to person, place, and time.  Skin: Skin is warm and dry.  Psychiatric: Her speech is normal and behavior is normal. Judgment and thought content normal. Her mood appears anxious. Cognition and memory are normal.    Review of Systems  Psychiatric/Behavioral: Negative for behavioral problems, confusion, decreased concentration, dysphoric mood, self-injury, sleep disturbance and suicidal ideas. The patient is nervous/anxious. The patient is not hyperactive.   All other systems reviewed and are negative.   Blood pressure 131/81, pulse (!) 127, temperature 98.3 F (36.8 C), temperature source Oral, resp. rate 18, SpO2 100 %.There is no height or weight on file to calculate BMI.  General Appearance: Casual  Eye Contact:  Good   Speech:  Normal Rate  Volume:  Normal  Mood:  Euthymic  Affect:  Congruent  Thought Process:  Coherent and Descriptions of Associations: Intact  Orientation:  Full (Time, Place, and Person)  Thought Content:  WDL  Suicidal Thoughts:  No  Homicidal Thoughts:  No  Memory:  Recent;   Good  Judgement:  Good  Insight:  Good  Psychomotor Activity:  Normal  Concentration:  Concentration: Good  Recall:  Good  Fund of Knowledge:  Good  Language:  Good  Akathisia:  No  Handed:  Right  AIMS (if indicated):     Assets:  Communication Skills Desire for Improvement Financial Resources/Insurance Housing Social Support Vocational/Educational  ADL's:  Intact  Cognition:  WNL  Sleep:       Disposition: Recommend overnight observation and stabilization Supportive therapy  provided about ongoing stressors.  Treatment Plan Summary: Daily contact with patient to assess and evaluate symptoms and progress in treatment and Medication management  Observation Level/Precautions:  15 minute checks Laboratory:  Chemistry Profile UDS Psychotherapy:   Medications:   Consultations:   Discharge Concerns:   Estimated LOS: Other:      Wandra Arthurs, NP 3/5/20211:02 AM

## 2019-08-09 NOTE — Progress Notes (Addendum)
CSW spoke with pt's daughter, Jonelle Sidle [phone]), to gain collateral information. Daughter states that her mother has been "acting out of character lately" - more easily agitated, appears "hyper", made threats to her boyfriend and reportedly spending excessive amounts of money. Daughter states that pt was hospitalized in a psychiatric hospital last year "around this time" as well.  CSW met with pt with Dr. Parke Poisson. Pt was calm, cooperative and was oriented x4. She speech was of a normal tone and speed. She voiced understanding that her children have been concerned about her and acknowledged that she dealt with more stressors lately but has been proactive in communicating with her outpatient [place] providers, and feels that she managing symptoms of bipolar disorder appropriately.   Kelly Camel, LCSW, [place] [facility]/TTS [phone]
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
## 2019-08-09 NOTE — Progress Notes (Signed)
Progress Note:   Pt is caucasian female  of 49 y.o. , DOB December 27, 1970, MRN  [id number]  presents IVC with aggressive behavior,  Hx of bipolar   Patient  reports good appetite, energy normal, and fair sleeping pattern  Pt denies SI, HI  And AVH. Pt denies pains. LBM 08/08/2019. Vitals signs WNL and being monitored.  Medication given as Rx, safety maintained with q15 minute checks.  Pt given personal phone to retrieve daughter's  phone number @ [phone]   Kelce Bouton K. Melvyn Neth MSN, RN, [facility] [phone]

## 2019-08-09 NOTE — Progress Notes (Signed)
Patient ID: Kelly Carr, adult   DOB: 1970-08-02, 49 y.o.   MRN: [id number] Pt A&O x 4, presents under IVC by her son, pt HI, making threats to other people.  Has not slept in 4 days, AVH noted.  Pt denies SI or HI.  Clam & cooperative, monitoring for safety, no distress noted.

## 2019-08-09 NOTE — Progress Notes (Signed)
[place] NOVEL CORONAVIRUS (COVID-19) DAILY CHECK-OFF SYMPTOMS - answer yes or no to each - every day NO YES  Have you had a fever in the past 24 hours?  . Fever (Temp > 37.80C / 100F) X   Have you had any of these symptoms in the past 24 hours? . New Cough .  Sore Throat  .  Shortness of Breath .  Difficulty Breathing .  Unexplained Body Aches   X   Have you had any one of these symptoms in the past 24 hours not related to allergies?   . Runny Nose .  Nasal Congestion .  Sneezing   X   If you have had runny nose, nasal congestion, sneezing in the past 24 hours, has it worsened?  X   EXPOSURES - check yes or no X   Have you traveled outside the state in the past 14 days?  X   Have you been in contact with someone with a confirmed diagnosis of COVID-19 or PUI in the past 14 days without wearing appropriate PPE?  X   Have you been living in the same home as a person with confirmed diagnosis of COVID-19 or a PUI (household contact)?    X   Have you been diagnosed with COVID-19?    X              What to do next: Answered NO to all: Answered YES to anything:   Proceed with unit schedule Follow the [facility] Inpatient Flowsheet.   Rileigh Kawashima K. Christophere Hillhouse MSN, RN, [facility] [phone] 

## 2019-08-09 NOTE — Plan of Care (Signed)
[facility] Observation Crisis Plan  Reason for Crisis Plan:  Crisis Stabilization   Plan of Care:  Referral for Inpatient Hospitalization  Family Support:      Current Living Environment:  Living Arrangements: Spouse/significant other(report she lives with her boyfriend)  Insurance:   Hospital Account    Name Acct ID Class Status Primary Coverage   [place], [place] [id number] BEHAVIORAL HEALTH OBSERVATION Open None        Guarantor Account (for Hospital Account [id number])    Name Relation to Pt Service Area Active? Acct Type   Charlsie Merles [facility] Yes Behavioral Health   Address Phone       [address] [place], [place] [address] [phone](H)          Coverage Information (for Hospital Account [id number])    Not on file      Legal Guardian:  Legal Guardian: Other:(self)  Primary Care Provider:  Leeroy Bock, DO  Current Outpatient Providers:  Aggie Cosier [facility]st  Psychiatrist:     Counselor/Therapist:  Name of Therapist: Melony Overly at [facility] with Medications:  No  Additional Information:   Tasia Catchings 3/5/20212:19 AM

## 2019-08-09 NOTE — Discharge Instructions (Signed)
For your behavioral health needs, you are advised to continue treatment with your current outpatient provider, Melony Overly, [place].  Your next appointment is scheduled for Monday, August 19, 2019 at 2:00 pm:       [facility]      [address]      [place], [place] [address]      [phone]

## 2019-08-13 MED ORDER — ACETAMINOPHEN 325 MG PO TABS
650.00 | ORAL_TABLET | ORAL | Status: DC
Start: ? — End: 2019-08-13

## 2019-08-13 MED ORDER — NICOTINE 21 MG/24HR TD PT24
1.00 | MEDICATED_PATCH | TRANSDERMAL | Status: DC
Start: 2019-08-16 — End: 2019-08-13

## 2019-08-13 MED ORDER — ONDANSETRON 4 MG PO TBDP
4.00 | ORAL_TABLET | ORAL | Status: DC
Start: ? — End: 2019-08-13

## 2019-08-13 MED ORDER — DIPHENHYDRAMINE HCL (SLEEP) 25 MG PO TABS
50.00 | ORAL_TABLET | ORAL | Status: DC
Start: ? — End: 2019-08-13

## 2019-08-14 DIAGNOSIS — F17200 Nicotine dependence, unspecified, uncomplicated: Secondary | ICD-10-CM | POA: Diagnosis present

## 2019-08-15 ENCOUNTER — Telehealth: Payer: Self-pay | Admitting: Physician Assistant

## 2019-08-15 MED ORDER — GENERIC EXTERNAL MEDICATION
Status: DC
Start: ? — End: 2019-08-15

## 2019-08-15 MED ORDER — LORAZEPAM 1 MG PO TABS
1.00 | ORAL_TABLET | ORAL | Status: DC
Start: 2019-08-15 — End: 2019-08-15

## 2019-08-15 NOTE — Telephone Encounter (Signed)
Pt called 3/10 to report she is in [place] @ [facility] She is safe. Will advise when discharged.

## 2019-08-15 NOTE — Telephone Encounter (Signed)
Noted. I'm sorry to hear she had to be admitted again.

## 2019-08-19 ENCOUNTER — Ambulatory Visit: Payer: Self-pay | Admitting: Physician Assistant

## 2019-08-20 NOTE — Telephone Encounter (Signed)
Ok, that's good.

## 2019-08-22 ENCOUNTER — Telehealth: Payer: Self-pay | Admitting: Physician Assistant
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
## 2019-08-22 NOTE — Telephone Encounter (Signed)
Left detailed message on SW voicemail

## 2019-08-22 NOTE — Telephone Encounter (Signed)
Kelly Carr, at your convenience, call the SW and ask them to send [place] ROI and if she or the dr wants to talk to me or give me more info, then I'll call, or they can fax records.

## 2019-08-27 ENCOUNTER — Ambulatory Visit (INDEPENDENT_AMBULATORY_CARE_PROVIDER_SITE_OTHER): Payer: Self-pay

## 2019-08-27 ENCOUNTER — Other Ambulatory Visit: Payer: Self-pay

## 2019-08-27 DIAGNOSIS — F3162 Bipolar disorder, current episode mixed, moderate: Secondary | ICD-10-CM

## 2019-08-27 NOTE — Progress Notes (Signed)
Kelly Carr came in today for her Abilify Maintena injection she receives every 30 days. She tolerated it with no complications. She should return around 04/23/20201.  LOT      [id number] [id number]       July 2022

## 2019-08-29 ENCOUNTER — Other Ambulatory Visit: Payer: Self-pay

## 2019-08-29 ENCOUNTER — Ambulatory Visit (INDEPENDENT_AMBULATORY_CARE_PROVIDER_SITE_OTHER): Payer: Self-pay | Admitting: Physician Assistant

## 2019-08-29 ENCOUNTER — Encounter: Payer: Self-pay | Admitting: Physician Assistant

## 2019-08-29 DIAGNOSIS — F411 Generalized anxiety disorder: Secondary | ICD-10-CM

## 2019-08-29 DIAGNOSIS — F3110 Bipolar disorder, current episode manic without psychotic features, unspecified: Secondary | ICD-10-CM

## 2019-08-29 DIAGNOSIS — F4310 Post-traumatic stress disorder, unspecified: Secondary | ICD-10-CM

## 2019-08-29 MED ORDER — LORAZEPAM 1 MG PO TABS: 0.5000 mg | ORAL_TABLET | Freq: Two times a day (BID) | ORAL | 0 refills | Status: DC | PRN
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
## 2019-09-20 ENCOUNTER — Emergency Department: Admit: 2019-09-21 | Primary: Family Medicine

## 2019-09-20 DIAGNOSIS — S93601A Unspecified sprain of right foot, initial encounter: Secondary | ICD-10-CM
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
## 2019-09-21 ENCOUNTER — Inpatient Hospital Stay
Admit: 2019-09-21 | Discharge: 2019-09-21 | Disposition: A | Discharge status: Home / Self Care | Attending: Emergency Medicine

## 2019-09-21 MED ORDER — LORAZEPAM 1 MG TAB
1 mg | ORAL | Status: AC
Start: 2019-09-21 — End: 2019-09-20
  Administered 2019-09-21: 02:00:00 via ORAL

## 2019-09-21 MED ORDER — NAPROXEN 250 MG TAB
250 mg | ORAL | Status: AC
Start: 2019-09-21 — End: 2019-09-20
  Administered 2019-09-21: 02:00:00 via ORAL

## 2019-09-21 MED FILL — NAPROXEN 250 MG TAB: 250 mg | ORAL | Qty: 2

## 2019-09-21 MED FILL — LORAZEPAM 1 MG TAB: 1 mg | ORAL | Qty: 1

## 2019-09-21 NOTE — ED Notes (Addendum)
I have reviewed discharge instructions with the patient.  The patient verbalized understanding.Patient armband removed and shredded  Patient discharged denies any SI/ HI. Patient reports no transportation at this time. Offered to get patient a LYFT, patient reports no physical address to go. Gave patient information to [facility], and surrounding shelters.

## 2019-09-21 NOTE — ED Notes (Signed)
I have reviewed discharge instructions with the patient.  The patient verbalized understanding.Patient armband removed and shredded  Patient discharged denies any SI/ HI. Patient reports no transportation at this time. Offered to get patient a LYFT, patient reports no physical address to go. Gave patient information to [facility], and surrounding shelters.

## 2019-09-23 ENCOUNTER — Encounter: Payer: Self-pay | Admitting: Emergency Medicine

## 2019-09-23 ENCOUNTER — Other Ambulatory Visit: Payer: Self-pay

## 2019-09-23 ENCOUNTER — Inpatient Hospital Stay
Admission: EM | Admit: 2019-09-23 | Discharge: 2019-09-25 | DRG: 918 | Disposition: A | Discharge status: Other Acute Inpatient Hospital | Payer: Self-pay | Attending: Internal Medicine | Admitting: Internal Medicine

## 2019-09-23 DIAGNOSIS — E785 Hyperlipidemia, unspecified: Secondary | ICD-10-CM | POA: Diagnosis present

## 2019-09-23 DIAGNOSIS — Z833 Family history of diabetes mellitus: Secondary | ICD-10-CM

## 2019-09-23 DIAGNOSIS — E876 Hypokalemia: Secondary | ICD-10-CM | POA: Diagnosis present

## 2019-09-23 DIAGNOSIS — K219 Gastro-esophageal reflux disease without esophagitis: Secondary | ICD-10-CM | POA: Diagnosis present

## 2019-09-23 DIAGNOSIS — I959 Hypotension, unspecified: Secondary | ICD-10-CM | POA: Diagnosis present

## 2019-09-23 DIAGNOSIS — Z818 Family history of other mental and behavioral disorders: Secondary | ICD-10-CM

## 2019-09-23 DIAGNOSIS — Z8249 Family history of ischemic heart disease and other diseases of the circulatory system: Secondary | ICD-10-CM

## 2019-09-23 DIAGNOSIS — E669 Obesity, unspecified: Secondary | ICD-10-CM | POA: Diagnosis present

## 2019-09-23 DIAGNOSIS — F3160 Bipolar disorder, current episode mixed, unspecified: Secondary | ICD-10-CM | POA: Diagnosis present

## 2019-09-23 DIAGNOSIS — Z20822 Contact with and (suspected) exposure to covid-19: Secondary | ICD-10-CM | POA: Diagnosis present

## 2019-09-23 DIAGNOSIS — F3163 Bipolar disorder, current episode mixed, severe, without psychotic features: Secondary | ICD-10-CM | POA: Diagnosis present

## 2019-09-23 DIAGNOSIS — F4310 Post-traumatic stress disorder, unspecified: Secondary | ICD-10-CM | POA: Diagnosis present

## 2019-09-23 DIAGNOSIS — M1990 Unspecified osteoarthritis, unspecified site: Secondary | ICD-10-CM | POA: Diagnosis present

## 2019-09-23 DIAGNOSIS — F419 Anxiety disorder, unspecified: Secondary | ICD-10-CM | POA: Diagnosis present

## 2019-09-23 DIAGNOSIS — B179 Acute viral hepatitis, unspecified: Secondary | ICD-10-CM | POA: Diagnosis present

## 2019-09-23 DIAGNOSIS — F17210 Nicotine dependence, cigarettes, uncomplicated: Secondary | ICD-10-CM | POA: Diagnosis present

## 2019-09-23 DIAGNOSIS — Z6828 Body mass index (BMI) 28.0-28.9, adult: Secondary | ICD-10-CM

## 2019-09-23 DIAGNOSIS — Z9151 Personal history of suicidal behavior: Secondary | ICD-10-CM | POA: Diagnosis present

## 2019-09-23 DIAGNOSIS — F312 Bipolar disorder, current episode manic severe with psychotic features: Secondary | ICD-10-CM | POA: Diagnosis present

## 2019-09-23 DIAGNOSIS — T391X2A Poisoning by 4-Aminophenol derivatives, intentional self-harm, initial encounter: Principal | ICD-10-CM | POA: Diagnosis present

## 2019-09-23 LAB — CBC WITH DIFFERENTIAL/PLATELET
Abs Immature Granulocytes: 0.03 10*3/uL (ref 0.00–0.07)
Basophils Absolute: 0 10*3/uL (ref 0.0–0.1)
Basophils Relative: 0 %
Eosinophils Absolute: 0 10*3/uL (ref 0.0–0.5)
Eosinophils Relative: 0 %
HCT: 35.4 % — ABNORMAL LOW (ref 36.0–46.0)
Hemoglobin: 11.3 g/dL — ABNORMAL LOW (ref 12.0–15.0)
Immature Granulocytes: 0 %
Lymphocytes Relative: 19 %
Lymphs Abs: 1.6 10*3/uL (ref 0.7–4.0)
MCH: 28.1 pg (ref 26.0–34.0)
MCHC: 31.9 g/dL (ref 30.0–36.0)
MCV: 88.1 fL (ref 80.0–100.0)
Monocytes Absolute: 0.8 10*3/uL (ref 0.1–1.0)
Monocytes Relative: 9 %
Neutro Abs: 6.2 10*3/uL (ref 1.7–7.7)
Neutrophils Relative %: 72 %
Platelets: 287 10*3/uL (ref 150–400)
RBC: 4.02 MIL/uL (ref 3.87–5.11)
RDW: 13.5 % (ref 11.5–15.5)
WBC: 8.6 10*3/uL (ref 4.0–10.5)
nRBC: 0 % (ref 0.0–0.2)

## 2019-09-23 LAB — BASIC METABOLIC PANEL
Anion gap: 10 (ref 5–15)
BUN: 11 mg/dL (ref 6–20)
CO2: 25 mmol/L (ref 22–32)
Calcium: 8.8 mg/dL — ABNORMAL LOW (ref 8.9–10.3)
Chloride: 104 mmol/L (ref 98–111)
Creatinine, Ser: 0.7 mg/dL (ref 0.44–1.00)
GFR calc Af Amer: >60 mL/min (ref >60)
GFR calc non Af Amer: >60 mL/min (ref >60)
Glucose, Bld: 128 mg/dL — ABNORMAL HIGH (ref 70–99)
Potassium: 3.6 mmol/L (ref 3.5–5.1)
Sodium: 139 mmol/L (ref 135–145)

## 2019-09-23 LAB — ACETAMINOPHEN LEVEL
Acetaminophen (Tylenol), Serum: 232 ug/mL (ref 10–30)
Acetaminophen (Tylenol), Serum: 133 ug/mL — ABNORMAL HIGH (ref 10–30)

## 2019-09-23 LAB — SALICYLATE LEVEL: Salicylate Lvl: <7 mg/dL — ABNORMAL LOW (ref 7.0–30.0)

## 2019-09-23 LAB — HEPATIC FUNCTION PANEL
ALT: 41 U/L (ref 0–44)
AST: 39 U/L (ref 15–41)
Albumin: 3.9 g/dL (ref 3.5–5.0)
Alkaline Phosphatase: 68 U/L (ref 38–126)
Bilirubin, Direct: <0.1 mg/dL (ref 0.0–0.2)
Total Bilirubin: 0.5 mg/dL (ref 0.3–1.2)
Total Protein: 6.7 g/dL (ref 6.5–8.1)

## 2019-09-23 LAB — PROTIME-INR
INR: 1 (ref 0.8–1.2)
Prothrombin Time: 13.3 seconds (ref 11.4–15.2)

## 2019-09-23 LAB — RESPIRATORY PANEL BY RT PCR (FLU A&B, COVID)
Influenza A by PCR: NEGATIVE
Influenza B by PCR: NEGATIVE
SARS Coronavirus 2 by RT PCR: NEGATIVE

## 2019-09-23 MED ORDER — ONDANSETRON HCL 4 MG/2ML IJ SOLN
4.0000 mg | Freq: Four times a day (QID) | INTRAMUSCULAR | Status: DC | PRN
  Filled 2019-09-23: qty 2

## 2019-09-23 MED ORDER — ACETYLCYSTEINE 20 % IN SOLN
70.0000 mg/kg | RESPIRATORY_TRACT | Status: DC
  Administered 2019-09-23 – 2019-09-24 (×5): 5240 mg via ORAL
  Filled 2019-09-23 (×7): qty 30

## 2019-09-23 MED ORDER — ONDANSETRON HCL 4 MG/2ML IJ SOLN
4.0000 mg | Freq: Once | INTRAMUSCULAR | Status: AC
  Administered 2019-09-23: 4 mg via INTRAVENOUS
  Filled 2019-09-23: qty 2

## 2019-09-23 MED ORDER — ACETYLCYSTEINE 20 % IN SOLN
140.0000 mg/kg | Freq: Once | RESPIRATORY_TRACT | Status: AC
  Administered 2019-09-23: 10480 mg via ORAL
  Filled 2019-09-23: qty 60

## 2019-09-23 MED ORDER — SODIUM CHLORIDE 0.9 % IV SOLN: INTRAVENOUS | Status: DC

## 2019-09-23 MED ORDER — SODIUM CHLORIDE 0.9 % IV BOLUS
1000.0000 mL | Freq: Once | INTRAVENOUS | Status: AC
  Administered 2019-09-23: 1000 mL via INTRAVENOUS

## 2019-09-23 NOTE — Progress Notes (Signed)
[place] for Acetylcysteine(NAC) IV/PO Indication:  APAP overdose  Allergies  Allergen Reactions  . Haldol [Haloperidol Lactate] Other (See Comments)    Syncope   . Risperidone And Related Other (See Comments)    Zones out  . Tramadol Itching  . Valproic Acid     Patient Measurements: Height: 5\' 4"  (162.6 cm) Weight: 74.8 kg (165 lb) IBW/kg (Calculated) : 54.7 Adjusted Body Weight:    Vital Signs: Temp: 97.7 F (36.5 C) (04/19 1442) Temp Source: Oral (04/19 1442) BP: 118/71 (04/19 1930) Pulse Rate: 56 (04/19 1930) Intake/Output from previous day: No intake/output data recorded. Intake/Output from this shift: No intake/output data recorded.  Labs: Recent Labs    09/23/19 1513  WBC 8.6  HGB 11.3*  HCT 35.4*  PLT 287  CREATININE 0.70  ALBUMIN 3.9  PROT 6.7  AST 39  ALT 41  ALKPHOS 68  BILITOT 0.5  BILIDIR <0.1  IBILI NOT CALCULATED   Estimated Creatinine Clearance (by C-G formula based on SCr of 0.7 mg/dL) Female: 30 mL/min Female: 106.7 mL/min   Microbiology: Recent Results (from the past 720 hour(s))  Respiratory Panel by RT PCR (Flu A&B, Covid) - Nasopharyngeal Swab     Status: None   Collection Time: 09/23/19  5:14 PM   Specimen: Nasopharyngeal Swab  Result Value Ref Range Status   SARS Coronavirus 2 by RT PCR NEGATIVE NEGATIVE Final    Comment: (NOTE) SARS-CoV-2 target nucleic acids are NOT DETECTED. The SARS-CoV-2 RNA is generally detectable in upper respiratoy specimens during the acute phase of infection. The lowest concentration of SARS-CoV-2 viral copies this assay can detect is 131 copies/mL. A negative result does not preclude SARS-Cov-2 infection and should not be used as the sole basis for treatment or other patient management decisions. A negative result may occur with  improper specimen collection/handling, submission of specimen other than nasopharyngeal swab, presence of viral mutation(s)  within the areas targeted by this assay, and inadequate number of viral copies (<131 copies/mL). A negative result must be combined with clinical observations, patient history, and epidemiological information. The expected result is Negative. Fact Sheet for Patients:  PinkCheek.be Fact Sheet for Healthcare Providers:  GravelBags.it This test is not yet ap proved or cleared by the [place] FDA and  has been authorized for detection and/or diagnosis of SARS-CoV-2 by FDA under an Emergency Use Authorization (EUA). This EUA will remain  in effect (meaning this test can be used) for the duration of the COVID-19 declaration under Section 564(b)(1) of the Act, 21 U.S.C. section 360bbb-3(b)(1), unless the authorization is terminated or revoked sooner.    Influenza A by PCR NEGATIVE NEGATIVE Final   Influenza B by PCR NEGATIVE NEGATIVE Final    Comment: (NOTE) The Xpert Xpress SARS-CoV-2/FLU/RSV assay is intended as an aid in  the diagnosis of influenza from Nasopharyngeal swab specimens and  should not be used as a sole basis for treatment. Nasal washings and  aspirates are unacceptable for Xpert Xpress SARS-CoV-2/FLU/RSV  testing. Fact Sheet for Patients: PinkCheek.be Fact Sheet for Healthcare Providers: GravelBags.it This test is not yet approved or cleared by the [place] FDA and  has been authorized for detection and/or diagnosis of SARS-CoV-2 by  FDA under an Emergency Use Authorization (EUA). This EUA will remain  in effect (meaning this test can be used) for the duration of the  Covid-19 declaration under Section 564(b)(1) of the Act, 21  U.S.C. section 360bbb-3(b)(1), unless the authorization is  terminated or revoked. Performed at [facility], [address], [place], [place] [address]     Medical History: Past Medical History:  Diagnosis  Date  . Abnormal Pap smear    Unknown results>colpo>normal  . Anxiety   . Arthritis   . Asthma   . Bipolar 1 disorder (HCC)   . Depression     Medications:  Scheduled:  . acetylcysteine  70 mg/kg Oral Q4H   Infusions:  . sodium chloride 75 mL/hr at 09/23/19 1912    Assessment: 49 yo F who ingested APAP (whole bottle of prescription tylenol which was prescribed for arthritis ) at about 1100. Poison control recommendations per RN note: 4 hr post ingestion acetaminophen level & CMP -EKG now -If tylenol level is > 75 at 4 hour mark, repeat in 3 hours and poison control will guide [place] from there  Plan:  4/19 1513 APAP level= 232, salicylate <7.0, INR 1.0  ASt 39  ALT 41 Will order AST/ALT and APAP level 22 hours after start of NAC tx as per protocol. Currently MD has ordered NAC ORALLY 140mg /kg x 1 loading dose  then 70 mg/kg every 4 hours. (per order set it says x 17 doses but not currently ordered that way). F/u with # doses .  PEr RN note, since APAP level 4 hrs after ingestion was >75 then will recheck level in 3 hours  4/19 1838 APAP level= 133.   Per RN note:Poison control called to check in with patient. Their suggestions are to continue with at least 5 dose of acetylcysteine orally before considering stopping and to do acteaminophen level, ast/alt levels at 24 hours post first blood draw. Will notify MD.   5/19 A 09/23/2019,8:30 PM

## 2019-09-23 NOTE — Progress Notes (Signed)
MEDICATION RELATED CONSULT NOTE - INITIAL   Pharmacy Consult for Acetylcysteine(NAC) IV/PO Indication:  APAP overdose  Allergies  Allergen Reactions  . Haldol [Haloperidol Lactate] Other (See Comments)    Syncope   . Risperidone And Related Other (See Comments)    Zones out  . Tramadol Itching  . Valproic Acid     Patient Measurements: Height: 5\' 4"  (162.6 cm) Weight: 74.8 kg (165 lb) IBW/kg (Calculated) : 54.7 Adjusted Body Weight:    Vital Signs: Temp: 97.7 F (36.5 C) (04/19 1442) Temp Source: Oral (04/19 1442) BP: 110/67 (04/19 1630) Pulse Rate: 70 (04/19 1630) Intake/Output from previous day: No intake/output data recorded. Intake/Output from this shift: No intake/output data recorded.  Labs: Recent Labs    09/23/19 1513  WBC 8.6  HGB 11.3*  HCT 35.4*  PLT 287  CREATININE 0.70  ALBUMIN 3.9  PROT 6.7  AST 39  ALT 41  ALKPHOS 68  BILITOT 0.5  BILIDIR <0.1  IBILI NOT CALCULATED   Estimated Creatinine Clearance (by C-G formula based on SCr of 0.7 mg/dL) Female: 87 mL/min Female: 106.7 mL/min   Microbiology: No results found for this or any previous visit (from the past 720 hour(s)).  Medical History: Past Medical History:  Diagnosis Date  . Abnormal Pap smear    Unknown results>colpo>normal  . Anxiety   . Arthritis   . Asthma   . Bipolar 1 disorder (HCC)   . Depression     Medications:  Scheduled:  . acetylcysteine  70 mg/kg Oral Q4H   Infusions:    Assessment: 49 yo F who ingested APAP (whole bottle of prescription tylenol which was prescribed for arthritis ) at about 1100. Poison control recommendations per RN note: 4 hr post ingestion acetaminophen level & CMP -EKG now -If tylenol level is > 75 at 4 hour mark, repeat in 3 hours and poison control will guide 49 from there  Plan:  Baseline labs obtained.  4/19 1513 APAP level= 232, salicylate <7.0, INR 1.0  ASt 39  ALT 41 Will order AST/ALT and APAP level 22 hours after start of  NAC tx. Currently MD has ordered NAC ORALLY 140mg /kg x 1 loading dose  then 70 mg/kg every 4 hours. (per order set it says x 17 doses but not currently ordered that way). F/u with # doses .  PEr RN note, since APAP level 4 hrs after ingestion was >75 then will recheck level in 3 hours  [place] A 09/23/2019,5:22 PM

## 2019-09-23 NOTE — ED Notes (Signed)
Poison control called to check in with patient. Their suggestions are to continue with at least 5 dose of acetylcysteine orally before considering stopping and to do acteaminophen level, ast/alt levels at 24 hours post first blood draw. Will notify MD.

## 2019-09-23 NOTE — ED Notes (Signed)
IVC/  PENDING  MEDICAL  BED AT  [facility]

## 2019-09-23 NOTE — ED Notes (Signed)
[facility]  CALLED  PER  DR  ROBINSON  MD 

## 2019-09-23 NOTE — ED Provider Notes (Signed)
[facility] Emergency Department Provider Note    First MD Initiated Contact with Patient 09/23/19 1501     (approximate)  I have reviewed the triage vital signs and the nursing notes.   HISTORY  Chief Complaint Ingestion  Level V caveat:  AMS - ingestion  HPI Kelly Carr is a 49 y.o. adult presents to the ER for evaluation of intentional overdose.  Patient has a history of bipolar disorder depression.  Per EMS intentionally took a bottle of Tylenol as well as 1 carbamazepine and one Ativan.  Patient is somewhat drowsy and uncooperative with exam and history taking.  After multiple questions she states that she thinks that she took the medication somewhere around 11:00 to noon.  This was an attempt to harm herself.    Past Medical History:  Diagnosis Date  . Abnormal Pap smear    Unknown results>colpo>normal  . Anxiety   . Arthritis   . Asthma   . Bipolar 1 disorder (HCC)   . Depression    Family History  Problem Relation Age of Onset  . Diabetes Mother   . Hypertension Mother   . Heart disease Mother 33  . Schizophrenia Mother   . Diabetes Maternal Grandmother   . Heart disease Maternal Grandmother   . Diabetes Maternal Grandfather   . Heart disease Maternal Grandfather   . Bipolar disorder Cousin   . Bipolar disorder Nephew   . Depression Daughter    Past Surgical History:  Procedure Laterality Date  . NO PAST SURGERIES     Patient Active Problem List   Diagnosis Date Noted  . Acetaminophen overdose, intentional self-harm, initial encounter (HCC) 09/23/2019  . Hypotension 09/23/2019  . Bipolar 1 disorder (HCC) 08/09/2019  . UTI (urinary tract infection) 01/18/2019  . Noncompliance 11/01/2018  . Cannabis abuse 11/01/2018  . Severe manic bipolar 1 disorder with psychotic behavior (HCC) 10/31/2018  . Bipolar disorder with severe mania (HCC) 10/31/2018  . Orbital floor (blow-out) closed fracture (HCC) 10/17/2018  . Bipolar 1  disorder, manic, moderate (HCC) 10/16/2018  . MDD (major depressive disorder) 10/08/2018  . Bipolar disorder, unspecified (HCC) 05/27/2018  . PTSD (post-traumatic stress disorder) 03/21/2018  . Constipation 04/14/2017  . Tendinopathy of right gluteus medius 04/05/2017  . Productive cough 03/06/2017  . 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 Manic behavior (HCC)   . Sprain of left ring finger 02/15/2017  . Chest pain 11/23/2016  . Ingrown toenail without infection 09/03/2016  . Right shoulder pain 03/08/2016  . Piriformis syndrome of right side 02/23/2016  . Right shoulder injury 02/17/2016  . HLD (hyperlipidemia) 11/12/2015  . Dysuria 05/26/2015  . Normocytic anemia 05/26/2015  . Pain of upper abdomen 05/04/2015  . 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 Labral tear of hip joint 12/27/2014  . Pharyngitis, chronic 08/11/2014  . Irregular menses 07/17/2013  . GERD (gastroesophageal reflux disease) 11/10/2010  . Carpal tunnel syndrome of left wrist 11/10/2010  . Severe bipolar I disorder, current or most recent episode mixed (HCC) 11/25/2008  . OTH ABNORMAL PAPANICOLAOU SMEAR CERVIX&CERV HPV 11/25/2008  . DEPRESSION 10/21/2008      Prior to Admission medications   Medication Sig Start Date End Date Taking? Authorizing Provider  LORazepam (ATIVAN) 1 MG tablet Take 0.5-1 tablets (0.5-1 mg total) by mouth 2 (two) times daily as needed for anxiety. 08/29/19  Yes Hurst, Glade Nurse, PA-C  oxcarbazepine (TRILEPTAL) 600 MG  tablet Take by mouth. 08/23/19  Yes [provider]  polyethylene glycol powder (GLYCOLAX/MIRALAX) 17 GM/SCOOP powder Take by mouth.   Yes [provider]  SM NICOTINE POLACRILEX 2 MG gum  08/23/19  Yes [provider]    Allergies Haldol [haloperidol lactate], Risperidone and related, Tramadol, and Valproic acid    Social History Social History     Tobacco Use  . Smoking status: Current Some Day Smoker    Packs/day: 0.50    Types: Cigarettes  . Smokeless tobacco: Never Used  Substance Use Topics  . Alcohol use: Yes    Alcohol/week: 1.0 - 2.0 standard drinks    Types: 1 - 2 Glasses of wine per week  . Drug use: Not Currently    Types: Marijuana    Comment: last was about a month ago    Review of Systems Patient denies headaches, rhinorrhea, blurry vision, numbness, shortness of breath, chest pain, edema, cough, abdominal pain, nausea, vomiting, diarrhea, dysuria, fevers, rashes or hallucinations unless otherwise stated above in HPI. ____________________________________________   PHYSICAL EXAM:  VITAL SIGNS: Vitals:   09/23/19 2100 09/23/19 2130  BP: 111/75 107/67  Pulse: 60 (!) 55  Resp: 19 15  Temp:    SpO2: 93% 93%    Constitutional: Drowsy, protecting airway. uncooperative with exam.  Eyes: Conjunctivae are normal.  Head: Atraumatic. Nose: No congestion/rhinnorhea. Mouth/Throat: Mucous membranes are moist.   Neck: No stridor. Painless ROM.  Cardiovascular: Normal rate, regular rhythm. Grossly normal heart sounds.  Good peripheral circulation. Respiratory: Normal respiratory effort.  No retractions. Lungs CTAB. Gastrointestinal: Soft and nontender. No distention. No abdominal bruits. No CVA tenderness. Genitourinary: deferred Musculoskeletal: No lower extremity tenderness nor edema.  No joint effusions. Neurologic:  Normal speech and language. No gross focal neurologic deficits are appreciated. No facial droop Skin:  Skin is warm, dry, superficial abrasion to left wrist.  No rash noted. Psychiatric: withdrawn, drowsy, uncooperative with exam ____________________________________________   LABS (all labs ordered are listed, but only abnormal results are displayed)  Results for orders placed or performed during the hospital encounter of 09/23/19 (from the past 24 hour(s))  Acetaminophen level     Status:  Abnormal   Collection Time: 09/23/19  3:13 PM  Result Value Ref Range   Acetaminophen (Tylenol), Serum 232 (HH) 10 - 30 ug/mL  Basic metabolic panel     Status: Abnormal   Collection Time: 09/23/19  3:13 PM  Result Value Ref Range   Sodium 139 135 - 145 mmol/L   Potassium 3.6 3.5 - 5.1 mmol/L   Chloride 104 98 - 111 mmol/L   CO2 25 22 - 32 mmol/L   Glucose, Bld 128 (H) 70 - 99 mg/dL   BUN 11 6 - 20 mg/dL   Creatinine, Ser 1.60 0.44 - 1.00 mg/dL   Calcium 8.8 (L) 8.9 - 10.3 mg/dL   GFR calc non Af Amer >60 >60 mL/min   GFR calc Af Amer >60 >60 mL/min   Anion gap 10 5 - 15  Hepatic function panel     Status: None   Collection Time: 09/23/19  3:13 PM  Result Value Ref Range   Total Protein 6.7 6.5 - 8.1 g/dL   Albumin 3.9 3.5 - 5.0 g/dL   AST 39 15 - 41 U/L   ALT 41 0 - 44 U/L   Alkaline Phosphatase 68 38 - 126 U/L   Total Bilirubin 0.5 0.3 - 1.2 mg/dL   Bilirubin, Direct <1.0 0.0 - 0.2  mg/dL   Indirect Bilirubin NOT CALCULATED 0.3 - 0.9 mg/dL  CBC with Differential     Status: Abnormal   Collection Time: 09/23/19  3:13 PM  Result Value Ref Range   WBC 8.6 4.0 - 10.5 K/uL   RBC 4.02 3.87 - 5.11 MIL/uL   Hemoglobin 11.3 (L) 12.0 - 15.0 g/dL   HCT 35.4 (L) 36.0 - 46.0 %   MCV 88.1 80.0 - 100.0 fL   MCH 28.1 26.0 - 34.0 pg   MCHC 31.9 30.0 - 36.0 g/dL   RDW 13.5 11.5 - 15.5 %   Platelets 287 150 - 400 K/uL   nRBC 0.0 0.0 - 0.2 %   Neutrophils Relative % 72 %   Neutro Abs 6.2 1.7 - 7.7 K/uL   Lymphocytes Relative 19 %   Lymphs Abs 1.6 0.7 - 4.0 K/uL   Monocytes Relative 9 %   Monocytes Absolute 0.8 0.1 - 1.0 K/uL   Eosinophils Relative 0 %   Eosinophils Absolute 0.0 0.0 - 0.5 K/uL   Basophils Relative 0 %   Basophils Absolute 0.0 0.0 - 0.1 K/uL   Immature Granulocytes 0 %   Abs Immature Granulocytes 0.03 0.00 - 0.07 K/uL  Protime-INR     Status: None   Collection Time: 09/23/19  3:13 PM  Result Value Ref Range   Prothrombin Time 13.3 11.4 - 15.2 seconds   INR 1.0  0.8 - 1.2  Salicylate level     Status: Abnormal   Collection Time: 09/23/19  3:13 PM  Result Value Ref Range   Salicylate Lvl <5.7 (L) 7.0 - 30.0 mg/dL  Respiratory Panel by RT PCR (Flu A&B, Covid) - Nasopharyngeal Swab     Status: None   Collection Time: 09/23/19  5:14 PM   Specimen: Nasopharyngeal Swab  Result Value Ref Range   SARS Coronavirus 2 by RT PCR NEGATIVE NEGATIVE   Influenza A by PCR NEGATIVE NEGATIVE   Influenza B by PCR NEGATIVE NEGATIVE  Acetaminophen level     Status: Abnormal   Collection Time: 09/23/19  6:38 PM  Result Value Ref Range   Acetaminophen (Tylenol), Serum 133 (H) 10 - 30 ug/mL   ____________________________________________  EKG My review and personal interpretation at Time: 14:59   Indication: ams  Rate: 65  Rhythm: sinus Axis: normal Other: normal intervals, no stemi ____________________________________________  RADIOLOGY  I personally reviewed all radiographic images ordered to evaluate for the above acute complaints and reviewed radiology reports and findings.  These findings were personally discussed with the patient.  Please see medical record for radiology report.  ____________________________________________   PROCEDURES  Procedure(s) performed:  .Critical Care Performed by: Merlyn Lot, MD Authorized by: Merlyn Lot, MD   Critical care provider statement:    Critical care time (minutes):  35   Critical care time was exclusive of:  Separately billable procedures and treating other patients   Critical care was necessary to treat or prevent imminent or life-threatening deterioration of the following conditions:  CNS failure or compromise   Critical care was time spent personally by me on the following activities:  Development of treatment plan with patient or surrogate, discussions with consultants, evaluation of patient's response to treatment, examination of patient, obtaining history from patient or surrogate, ordering and  performing treatments and interventions, ordering and review of laboratory studies, ordering and review of radiographic studies, pulse oximetry, re-evaluation of patient's condition and review of old charts      Critical Care performed: yes ____________________________________________  INITIAL IMPRESSION / ASSESSMENT AND PLAN / ED COURSE  Pertinent labs & imaging results that were available during my care of the patient were reviewed by me and considered in my medical decision making (see chart for details).   DDX: Psychosis, delirium, medication effect, noncompliance, polysubstance abuse, Si, Hi, depression   Jourdin Moncrieffe is a 49 y.o. who presents to the ED with intentional Tylenol ingestion as described above.  Patient be placed under IVC.  Laurell Josephs be sent for the but differential.  She is drowsy from what I suspect is the Ativan and possible carbamazepine but also reportedly 1 pill each.  She is protecting her airway.The patient will be placed on continuous pulse oximetry and telemetry for monitoring.  Laboratory evaluation will be sent to evaluate for the above complaints.     Clinical Course as of Sep 22 2244  Mon Sep 23, 2019  1613 Patient does have evidence of toxic Tylenol ingestion.  Assuming that she ingested at 11 her Tylenol level is above the nomogram for which I will order N-acetylcysteine.  Will order IV fluids.  Will consult with tertiary care center for transfer.   [PR]  1633 Discussed case with Dr. Raford Pitcher hepatology at [facility].  Patient would not be a transplant candidate as she is suicidal.  Furthermore they do not have a bed availability.  At this point I now have her LFTs for INR as well as LFTs are all normal.  She is receiving N-acetylcysteine.  Will discuss with hospitalist at this facility for admission and further medical management.   [PR]  1654 Case discussed with hospitalist.  Poison control notified.  Will be admitted for medical management with  psychiatric consultation.   [PR]    Clinical Course User Index [PR] Willy Eddy, MD    The patient was evaluated in Emergency Department today for the symptoms described in the history of present illness. He/she was evaluated in the context of the global COVID-19 pandemic, which necessitated consideration that the patient might be at risk for infection with the SARS-CoV-2 virus that causes COVID-19. Institutional protocols and algorithms that pertain to the evaluation of patients at risk for COVID-19 are in a state of rapid change based on information released by regulatory bodies including the CDC and federal and state organizations. These policies and algorithms were followed during the patient's care in the ED.  As part of my medical decision making, I reviewed the following data within the electronic MEDICAL RECORD NUMBER Nursing notes reviewed and incorporated, Labs reviewed, notes from prior ED visits and [place] Controlled Substance Database   ____________________________________________   FINAL CLINICAL IMPRESSION(S) / ED DIAGNOSES  Final diagnoses:  Acetaminophen overdose, intentional self-harm, initial encounter (HCC)      NEW MEDICATIONS STARTED DURING THIS VISIT:  New Prescriptions   No medications on file     Note:  This document was prepared using Dragon voice recognition software and may include unintentional dictation errors.    Willy Eddy, MD 09/23/19 2246

## 2019-09-23 NOTE — ED Triage Notes (Signed)
Pt arrival from home via [facility] due to tylenol ingestion. EMS states that the patient took her whole bottle of prescription tylenol which was prescribed for arthritis in addition to one carbamazepine and one ativan. Pt confirms taking these pills and also states she attempted to cut herself on her left wrist. Pt has mild abrasions, but none that cut through the skin.   Pt states that she is having thoughts of hurting herself and that she was trying to kill herself in taking the medication. Pt is currently drowsy and states she's feeling nauseas.

## 2019-09-23 NOTE — ED Notes (Signed)
Pt not dressed out at this time due to patient being drowsy and not medically cleared at this time. This RN concerned with her stability to walk to bathroom to change. Pt belongs gathered and placed beside bed, patient in hallway bed and being visualized at all times by staff.

## 2019-09-23 NOTE — ED Notes (Signed)
Poison control called and their suggestions are as follows: -activated charcoal if within an hour of ingestion -4 hr post ingestion acetaminophen level & CMP -EKG now -If tylenol level is > 75 at 4 hour mark, repeat in 3 hours and poison control will guide [place] from there.

## 2019-09-23 NOTE — H&P (Addendum)
History and Physical:    Kelly Carr   WER:[id number] DOB: 1970/10/08 DOA: 09/23/2019  Referring MD/provider: Norton Blizzard, MD PCP: Richarda Osmond, DO   Patient coming from: Home  Chief Complaint: Tylenol overdose  History of Present Illness:   Kelly Carr is an 49 y.o. adult with medical history significant for depression, bipolar disorder, anxiety, asthma, who was brought to the hospital after she took several pills of Tylenol and then attempt to kill herself.  She said it was a big bottle of arthritis Tylenol she took all but 10 tablets in the bottle.  Reportedly, she also took 1 carbamazepine and was not able to she did not mention this to me.  She felt and in her life was the best thing to do under the circumstances.  She said she has issues with depression and had been prescribed medications about 6 months ago.  However, she says she has not been medically adherent and had not taken her medicines "for months".  She said she has 2 children, an 80 year old and a 71 year old.  She also has a brother and a mother.  When I asked her whether she was still suicidal, she did not answer. She feels nauseous and slightly dizzy.  Otherwise, she has no other complaints.  No headache, confusion, loss of consciousness, vomiting, abdominal pain, diarrhea, cough, palpitations, shortness of breath or chest pain  ED Course:  The patient was hypotensive with BP of 85/55 otherwise vital signs were unremarkable.  She was given Zofran, 1 L of normal saline and started on oral acetylcysteine.  Psychiatrist was consulted by ED physician, Dr. Quentin Cornwall.   ROS:   ROS all other systems reviewed were negative  Past Medical History:   Past Medical History:  Diagnosis Date  . Abnormal Pap smear    Unknown results>colpo>normal  . Anxiety   . Arthritis   . Asthma   . Bipolar 1 disorder ([place])   . Depression     Past Surgical History:   Past Surgical History:  Procedure Laterality Date   . NO PAST SURGERIES      Social History:   Social History   Socioeconomic History  . Marital status: Single    Spouse name: Not on file  . Number of children: Not on file  . Years of education: Not on file  . Highest education level: Not on file  Occupational History  . Not on file  Tobacco Use  . Smoking status: Current Some Day Smoker    Packs/day: 0.50    Types: Cigarettes  . Smokeless tobacco: Never Used  Substance and Sexual Activity  . Alcohol use: Yes    Alcohol/week: 1.0 - 2.0 standard drinks    Types: 1 - 2 Glasses of wine per week  . Drug use: Not Currently    Types: Marijuana    Comment: last was about a month ago  . Sexual activity: Yes    Partners: Male    Birth control/protection: None  Other Topics Concern  . Not on file  Social History Narrative  . Not on file   Social Determinants of Health   Financial Resource Strain:   . Difficulty of Paying Living Expenses:   Food Insecurity:   . Worried About Charity fundraiser in the Last Year:   . Arboriculturist in the Last Year:   Transportation Needs:   . Film/video editor (Medical):   Marland Kitchen Lack of Transportation (Non-Medical):   Physical Activity:   .  Days of Exercise per Week:   . Minutes of Exercise per Session:   Stress:   . Feeling of Stress :   Social Connections:   . Frequency of Communication with Friends and Family:   . Frequency of Social Gatherings with Friends and Family:   . Attends Religious Services:   . Active Member of Clubs or Organizations:   . Attends Banker Meetings:   Marland Kitchen Marital Status:   Intimate Partner Violence:   . Fear of Current or Ex-Partner:   . Emotionally Abused:   Marland Kitchen Physically Abused:   . Sexually Abused:     Allergies   Haldol [haloperidol lactate], Risperidone and related, Tramadol, and Valproic acid  Family history:   Family History  Problem Relation Age of Onset  . Diabetes Mother   . Hypertension Mother   . Heart disease Mother  15  . Schizophrenia Mother   . Diabetes Maternal Grandmother   . Heart disease Maternal Grandmother   . Diabetes Maternal Grandfather   . Heart disease Maternal Grandfather   . Bipolar disorder Cousin   . Bipolar disorder Nephew   . Depression Daughter     Current Medications:   Prior to Admission medications   Medication Sig Start Date End Date Taking? Authorizing Provider  ARIPiprazole ER (ABILIFY MAINTENA) 400 MG SRER injection Inject 2 mLs (400 mg total) into the muscle every 28 (twenty-eight) days. 11/29/18   [name], [name], MD  LORazepam (ATIVAN) 1 MG tablet Take 0.5-1 tablets (0.5-1 mg total) by mouth 2 (two) times daily as needed for anxiety. 08/29/19   Cherie Ouch, PA-C    Physical Exam:   Vitals:   09/23/19 1630 09/23/19 1700 09/23/19 1730 09/23/19 1800  BP: 110/67 111/72 101/66 100/61  Pulse: 70 78 (!) 54 63  Resp: 16 17 15 15   Temp:      TempSrc:      SpO2: 100% 100% 100% 98%  Weight:      Height:         Physical Exam: Blood pressure 100/61, pulse 63, temperature 97.7 F (36.5 C), temperature source Oral, resp. rate 15, height 5\' 4"  (1.626 m), weight 74.8 kg, SpO2 98 %. Gen: No acute distress. Head: Normocephalic, atraumatic. Eyes: Pupils equal, round and reactive to light. Extraocular movements intact.  Sclerae nonicteric.  Mouth: Moist mucous membranes Neck: Supple, no thyromegaly, no lymphadenopathy, no jugular venous distention. Chest: Lungs are clear to auscultation with good air movement. No rales, rhonchi or wheezes.  CV: Heart sounds are regular with an S1, S2. No murmurs, rubs or gallops.  Abdomen: Soft, nontender, nondistended with normal active bowel sounds. No palpable masses. Extremities: Extremities are without clubbing, or cyanosis. No edema. Pedal pulses 2+.  Skin: Warm and dry. No rashes, lesions or wounds Neuro: Alert and oriented times 3; grossly nonfocal.  Psych: Insight is good and judgment is appropriate. Mood and affect  normal.   Data Review:    Labs: Basic Metabolic Panel: Recent Labs  Lab 09/23/19 1513  NA 139  K 3.6  CL 104  CO2 25  GLUCOSE 128*  BUN 11  CREATININE 0.70  CALCIUM 8.8*   Liver Function Tests: Recent Labs  Lab 09/23/19 1513  AST 39  ALT 41  ALKPHOS 68  BILITOT 0.5  PROT 6.7  ALBUMIN 3.9   No results for input(s): LIPASE, AMYLASE in the last 168 hours. No results for input(s): AMMONIA in the last 168 hours. CBC: Recent Labs  Lab  09/23/19 1513  WBC 8.6  NEUTROABS 6.2  HGB 11.3*  HCT 35.4*  MCV 88.1  PLT 287   Cardiac Enzymes: No results for input(s): CKTOTAL, CKMB, CKMBINDEX, TROPONINI in the last 168 hours.  BNP (last 3 results) No results for input(s): PROBNP in the last 8760 hours. CBG: No results for input(s): GLUCAP in the last 168 hours.  Urinalysis    Component Value Date/Time   COLORURINE YELLOW 07/31/2017 1114   APPEARANCEUR HAZY (A) 07/31/2017 1114   APPEARANCEUR Clear 11/19/2013 0126   LABSPEC 1.017 07/31/2017 1114   LABSPEC 1.019 11/19/2013 0126   PHURINE 5.0 07/31/2017 1114   GLUCOSEU NEGATIVE 07/31/2017 1114   GLUCOSEU Negative 11/19/2013 0126   HGBUR NEGATIVE 07/31/2017 1114   BILIRUBINUR negative 01/18/2019 0940   BILIRUBINUR negative 04/12/2018 1155   BILIRUBINUR Negative 11/19/2013 0126   KETONESUR negative 01/18/2019 0940   KETONESUR NEGATIVE 07/31/2017 1114   PROTEINUR negative 01/18/2019 0940   PROTEINUR Negative 04/12/2018 1155   PROTEINUR NEGATIVE 07/31/2017 1114   UROBILINOGEN 0.2 01/18/2019 0940   UROBILINOGEN 0.2 07/23/2012 1213   NITRITE Negative 01/18/2019 0940   NITRITE negative 04/12/2018 1155   NITRITE NEGATIVE 07/31/2017 1114   LEUKOCYTESUR Moderate (2+) (A) 01/18/2019 0940   LEUKOCYTESUR Trace 11/19/2013 0126      Radiographic Studies: No results found.  EKG: Independently reviewed.  Normal sinus rhythm with sinus arrhythmia   Assessment/Plan:   Principal Problem:   Acetaminophen overdose,  intentional self-harm, initial encounter (HCC) Active Problems:   Hypotension   Severe bipolar I disorder, current or most recent episode mixed (HCC)   Intentional acetaminophen overdose/suicide attempt in a patient with bipolar disorder, anxiety and depression: Admit to MedSurg with cardiac monitoring.  Patient has been started on oral acetylcysteine.  Dr. Roxan Hockey, ED physician, said he had notified poison control and were in agreement with the current treatment plan (Tylenol overdose protocol).  Monitor liver enzymes and INR.  Psychiatrist has been consulted for further evaluation.  Patient has been placed under involuntary commitment.  She has also been placed on suicide precautions and one-to-one watch.  Hypotension: BP has improved.  Continue IV fluids for hydration.   Body mass index is 28.32 kg/m.  Other information:   DVT prophylaxis: SCDs Code Status: Full code. Family Communication: None at the bedside Disposition Plan: Likely transfer to behavioral unit at discharge Consults called: Psychiatrist Admission status: Inpatient  The medical decision making on this patient was of high complexity and the patient is at high risk for clinical deterioration, therefore this is a level 3 visit.  Time spent 71 minutes  Lakshya Mcgillicuddy [facility]   How to contact the [facility] Attending or Consulting provider 7A - 7P or covering provider during after hours 7P -7A, for this patient?   1. Check the care team in [facility] and look for a) attending/consulting TRH provider listed and b) the [facility] team listed 2. Log into [email or web address] and use [place]'s universal password to access. If you do not have the password, please contact the hospital operator. 3. Locate the [facility] provider you are looking for under [facility] and page to a number that you can be directly reached. 4. If you still have difficulty reaching the provider, please page the [facility] (Director on Call) for the Hospitalists  listed on amion for assistance.  09/23/2019, 6:37 PM

## 2019-09-24 LAB — COMPREHENSIVE METABOLIC PANEL
ALT: 221 U/L — ABNORMAL HIGH (ref 0–44)
AST: 226 U/L — ABNORMAL HIGH (ref 15–41)
Albumin: 3 g/dL — ABNORMAL LOW (ref 3.5–5.0)
Alkaline Phosphatase: 60 U/L (ref 38–126)
Anion gap: 7 (ref 5–15)
BUN: 11 mg/dL (ref 6–20)
CO2: 24 mmol/L (ref 22–32)
Calcium: 8.3 mg/dL — ABNORMAL LOW (ref 8.9–10.3)
Chloride: 108 mmol/L (ref 98–111)
Creatinine, Ser: 0.51 mg/dL (ref 0.44–1.00)
GFR calc Af Amer: >60 mL/min (ref >60)
GFR calc non Af Amer: >60 mL/min (ref >60)
Glucose, Bld: 87 mg/dL (ref 70–99)
Potassium: 3.2 mmol/L — ABNORMAL LOW (ref 3.5–5.1)
Sodium: 139 mmol/L (ref 135–145)
Total Bilirubin: 0.8 mg/dL (ref 0.3–1.2)
Total Protein: 5.7 g/dL — ABNORMAL LOW (ref 6.5–8.1)

## 2019-09-24 LAB — URINE DRUG SCREEN, QUALITATIVE (ARMC ONLY)
Amphetamines, Ur Screen: NOT DETECTED
Barbiturates, Ur Screen: NOT DETECTED
Benzodiazepine, Ur Scrn: NOT DETECTED
Cannabinoid 50 Ng, Ur ~~LOC~~: POSITIVE — AB
Cocaine Metabolite,Ur ~~LOC~~: NOT DETECTED
MDMA (Ecstasy)Ur Screen: NOT DETECTED
Methadone Scn, Ur: NOT DETECTED
Opiate, Ur Screen: NOT DETECTED
Phencyclidine (PCP) Ur S: NOT DETECTED
Tricyclic, Ur Screen: NOT DETECTED

## 2019-09-24 LAB — URINALYSIS, COMPLETE (UACMP) WITH MICROSCOPIC
Bacteria, UA: NONE SEEN
Bilirubin Urine: NEGATIVE
Glucose, UA: NEGATIVE mg/dL
Hgb urine dipstick: NEGATIVE
Ketones, ur: 5 mg/dL — AB
Leukocytes,Ua: NEGATIVE
Nitrite: NEGATIVE
Protein, ur: NEGATIVE mg/dL
Specific Gravity, Urine: >1.046 — ABNORMAL HIGH (ref 1.005–1.030)
pH: 5 (ref 5.0–8.0)

## 2019-09-24 LAB — MAGNESIUM: Magnesium: 2.4 mg/dL (ref 1.7–2.4)

## 2019-09-24 LAB — PREGNANCY, URINE: Preg Test, Ur: NEGATIVE

## 2019-09-24 LAB — CBC WITH DIFFERENTIAL/PLATELET
Abs Immature Granulocytes: 0.04 10*3/uL (ref 0.00–0.07)
Basophils Absolute: 0 10*3/uL (ref 0.0–0.1)
Basophils Relative: 0 %
Eosinophils Absolute: 0.1 10*3/uL (ref 0.0–0.5)
Eosinophils Relative: 1 %
HCT: 32.7 % — ABNORMAL LOW (ref 36.0–46.0)
Hemoglobin: 10.9 g/dL — ABNORMAL LOW (ref 12.0–15.0)
Immature Granulocytes: 0 %
Lymphocytes Relative: 19 %
Lymphs Abs: 2 10*3/uL (ref 0.7–4.0)
MCH: 28.6 pg (ref 26.0–34.0)
MCHC: 33.3 g/dL (ref 30.0–36.0)
MCV: 85.8 fL (ref 80.0–100.0)
Monocytes Absolute: 0.7 10*3/uL (ref 0.1–1.0)
Monocytes Relative: 7 %
Neutro Abs: 7.7 10*3/uL (ref 1.7–7.7)
Neutrophils Relative %: 73 %
Platelets: 257 10*3/uL (ref 150–400)
RBC: 3.81 MIL/uL — ABNORMAL LOW (ref 3.87–5.11)
RDW: 13.7 % (ref 11.5–15.5)
WBC: 10.7 10*3/uL — ABNORMAL HIGH (ref 4.0–10.5)
nRBC: 0 % (ref 0.0–0.2)

## 2019-09-24 LAB — ALT: ALT: 209 U/L — ABNORMAL HIGH (ref 0–44)

## 2019-09-24 LAB — PROTIME-INR
INR: 1.2 (ref 0.8–1.2)
Prothrombin Time: 15.5 seconds — ABNORMAL HIGH (ref 11.4–15.2)

## 2019-09-24 LAB — AST: AST: 140 U/L — ABNORMAL HIGH (ref 15–41)

## 2019-09-24 LAB — PHOSPHORUS: Phosphorus: 2.3 mg/dL — ABNORMAL LOW (ref 2.5–4.6)

## 2019-09-24 LAB — HIV ANTIBODY (ROUTINE TESTING W REFLEX): HIV Screen 4th Generation wRfx: NONREACTIVE

## 2019-09-24 LAB — ACETAMINOPHEN LEVEL: Acetaminophen (Tylenol), Serum: <10 ug/mL — ABNORMAL LOW (ref 10–30)

## 2019-09-24 MED ORDER — POTASSIUM CHLORIDE CRYS ER 20 MEQ PO TBCR
40.0000 meq | EXTENDED_RELEASE_TABLET | Freq: Once | ORAL | Status: AC
  Administered 2019-09-24: 40 meq via ORAL
  Filled 2019-09-24: qty 2

## 2019-09-24 NOTE — ED Notes (Signed)
All pt belongings placed at nursing desk. Pt assisted to toilet.  Alert and oriented. Tearful.

## 2019-09-24 NOTE — ED Notes (Signed)
Pt assisted to toilet 

## 2019-09-24 NOTE — ED Notes (Signed)
Pt give breakfast tray
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
## 2019-09-24 NOTE — ED Notes (Signed)
Ready bed @ [id number], patient going to room 160.

## 2019-09-24 NOTE — ED Notes (Signed)
Pt denies any SI at this time and is able to contract for safety.

## 2019-09-24 NOTE — Consult Note (Signed)
[facility] Face-to-Face Psychiatry Consult   Reason for Consult: suicide attempt, hx bipolar disorder Referring Physician: Floor MD Patient Identification: Kelly Carr MRN:  [id number] Principal Diagnosis: Severe bipolar I disorder, current or most recent episode mixed (HCC) Diagnosis:  Principal Problem:   Severe bipolar I disorder, current or most recent episode mixed (HCC) Active Problems:   Acetaminophen overdose, intentional self-harm, initial encounter (HCC)   Hypotension   Total Time spent with patient: 20 minutes  Subjective:  Per Floor MD and ED Kelly Carr is a 49 y.o. adult patient  with medical history significant for depression, bipolar disorder, anxiety, asthma, who was brought to the hospital after she took several pills of Tylenol and then attempt to kill herself.  She said it was a big bottle of arthritis Tylenol she took all but 10 tablets in the bottle.  Reportedly, she also took 1 carbamazepine and was not able to she did not mention this to me.  She felt and in her life was the best thing to do under the circumstances.  She said she has issues with depression and had been prescribed medications about 6 months ago.  However, she says she has not been medically adherent and had not taken her medicines "for months".  She said she has 2 children, an 47 year old and a 40 year old.  She also has a brother and a mother.  When I asked her whether she was still suicidal, she did not answer. She feels nauseous and slightly dizzy.  Otherwise, she has no other complaints.  No headache, confusion, loss of consciousness, vomiting, abdominal pain, diarrhea, cough, palpitations, shortness of breath or chest pain  ED Course:  The patient was hypotensive with BP of 85/55 otherwise vital signs were unremarkable.  She was given Zofran, 1 L of normal saline and started on oral acetylcysteine.  Psychiatrist was consulted by ED physician, Dr. Roxan Hockey. HPI:   Past Psychiatric History:  Risk to  Self:   Risk to Others:   Prior Inpatient Therapy:   Prior Outpatient Therapy:    Past Medical History:  Past Medical History:  Diagnosis Date  . Abnormal Pap smear    Unknown results>colpo>normal  . Anxiety   . Arthritis   . Asthma   . Bipolar 1 disorder (HCC)   . Depression     Past Surgical History:  Procedure Laterality Date  . NO PAST SURGERIES     Family History:  Family History  Problem Relation Age of Onset  . Diabetes Mother   . Hypertension Mother   . Heart disease Mother 69  . Schizophrenia Mother   . Diabetes Maternal Grandmother   . Heart disease Maternal Grandmother   . Diabetes Maternal Grandfather   . Heart disease Maternal Grandfather   . Bipolar disorder Cousin   . Bipolar disorder Nephew   . Depression Daughter    Family Psychiatric  History: Social History:  Social History   Substance and Sexual Activity  Alcohol Use Yes  . Alcohol/week: 1.0 - 2.0 standard drinks  . Types: 1 - 2 Glasses of wine per week     Social History   Substance and Sexual Activity  Drug Use Not Currently  . Types: Marijuana   Comment: last was about a month ago    Social History   Socioeconomic History  . Marital status: Single    Spouse name: Not on file  . Number of children: Not on file  . Years of education: Not on file  . Highest education  level: Not on file  Occupational History  . Not on file  Tobacco Use  . Smoking status: Current Some Day Smoker    Packs/day: 0.50    Types: Cigarettes  . Smokeless tobacco: Never Used  Substance and Sexual Activity  . Alcohol use: Yes    Alcohol/week: 1.0 - 2.0 standard drinks    Types: 1 - 2 Glasses of wine per week  . Drug use: Not Currently    Types: Marijuana    Comment: last was about a month ago  . Sexual activity: Yes    Partners: Male    Birth control/protection: None  Other Topics Concern  . Not on file  Social History Narrative  . Not on file   Social Determinants of Health   Financial  Resource Strain:   . Difficulty of Paying Living Expenses:   Food Insecurity:   . Worried About Programme researcher, broadcasting/film/video in the Last Year:   . Barista in the Last Year:   Transportation Needs:   . Freight forwarder (Medical):   Marland Kitchen Lack of Transportation (Non-Medical):   Physical Activity:   . Days of Exercise per Week:   . Minutes of Exercise per Session:   Stress:   . Feeling of Stress :   Social Connections:   . Frequency of Communication with Friends and Family:   . Frequency of Social Gatherings with Friends and Family:   . Attends Religious Services:   . Active Member of Clubs or Organizations:   . Attends Banker Meetings:   Marland Kitchen Marital Status:    Additional Social History:    Allergies:   Allergies  Allergen Reactions  . Haldol [Haloperidol Lactate] Other (See Comments)    Syncope   . Risperidone And Related Other (See Comments)    Zones out  . Tramadol Itching  . Valproic Acid     Labs:  Results for orders placed or performed during the hospital encounter of 09/23/19 (from the past 48 hour(s))  Acetaminophen level     Status: Abnormal   Collection Time: 09/23/19  3:13 PM  Result Value Ref Range   Acetaminophen (Tylenol), Serum 232 (HH) 10 - 30 ug/mL    Comment: CRITICAL RESULT CALLED TO, READ BACK BY AND VERIFIED WITH CHRISSY BRAND AT 1559 ON 09/23/2019 [facility]. (NOTE) Therapeutic concentrations vary significantly. A range of 10-30 ug/mL  may be an effective concentration for many patients. However, some  are best treated at concentrations outside of this range. Acetaminophen concentrations >150 ug/mL at 4 hours after ingestion  and >50 ug/mL at 12 hours after ingestion are often associated with  toxic reactions. Performed at [facility], [address] [place], [place] [address]   Basic metabolic panel     Status: Abnormal   Collection Time: 09/23/19  3:13 PM  Result Value Ref Range   Sodium 139 135 - 145 mmol/L   Potassium  3.6 3.5 - 5.1 mmol/L   Chloride 104 98 - 111 mmol/L   CO2 25 22 - 32 mmol/L   Glucose, Bld 128 (H) 70 - 99 mg/dL    Comment: Glucose reference range applies only to samples taken after fasting for at least 8 hours.   BUN 11 6 - 20 mg/dL   Creatinine, Ser 6.04 0.44 - 1.00 mg/dL   Calcium 8.8 (L) 8.9 - 10.3 mg/dL   GFR calc non Af Amer >60 >60 mL/min   GFR calc Af Amer >60 >60 mL/min  Anion gap 10 5 - 15    Comment: Performed at [facility], [address] [place], [place] [address]  Hepatic function panel     Status: None   Collection Time: 09/23/19  3:13 PM  Result Value Ref Range   Total Protein 6.7 6.5 - 8.1 g/dL   Albumin 3.9 3.5 - 5.0 g/dL   AST 39 15 - 41 U/L   ALT 41 0 - 44 U/L   Alkaline Phosphatase 68 38 - 126 U/L   Total Bilirubin 0.5 0.3 - 1.2 mg/dL   Bilirubin, Direct <6.0<0.1 0.0 - 0.2 mg/dL   Indirect Bilirubin NOT CALCULATED 0.3 - 0.9 mg/dL    Comment: Performed at [facility], [address] [place], [place] [address]  CBC with Differential     Status: Abnormal   Collection Time: 09/23/19  3:13 PM  Result Value Ref Range   WBC 8.6 4.0 - 10.5 K/uL   RBC 4.02 3.87 - 5.11 MIL/uL   Hemoglobin 11.3 (L) 12.0 - 15.0 g/dL   HCT 81.135.4 (L) 91.436.0 - 78.246.0 %   MCV 88.1 80.0 - 100.0 fL   MCH 28.1 26.0 - 34.0 pg   MCHC 31.9 30.0 - 36.0 g/dL   RDW 95.613.5 21.311.5 - 08.615.5 %   Platelets 287 150 - 400 K/uL   nRBC 0.0 0.0 - 0.2 %   Neutrophils Relative % 72 %   Neutro Abs 6.2 1.7 - 7.7 K/uL   Lymphocytes Relative 19 %   Lymphs Abs 1.6 0.7 - 4.0 K/uL   Monocytes Relative 9 %   Monocytes Absolute 0.8 0.1 - 1.0 K/uL   Eosinophils Relative 0 %   Eosinophils Absolute 0.0 0.0 - 0.5 K/uL   Basophils Relative 0 %   Basophils Absolute 0.0 0.0 - 0.1 K/uL   Immature Granulocytes 0 %   Abs Immature Granulocytes 0.03 0.00 - 0.07 K/uL    Comment: Performed at [facility], [address] [place], [place] [address]  Protime-INR     Status: None   Collection Time:  09/23/19  3:13 PM  Result Value Ref Range   Prothrombin Time 13.3 11.4 - 15.2 seconds   INR 1.0 0.8 - 1.2    Comment: (NOTE) INR goal varies based on device and disease states. Performed at [facility], [address] [place], [place] [address]   Salicylate level     Status: Abnormal   Collection Time: 09/23/19  3:13 PM  Result Value Ref Range   Salicylate Lvl <7.0 (L) 7.0 - 30.0 mg/dL    Comment: Performed at [facility], [address] [place], [place] [address]  Respiratory Panel by RT PCR (Flu A&B, Covid) - Nasopharyngeal Swab     Status: None   Collection Time: 09/23/19  5:14 PM   Specimen: Nasopharyngeal Swab  Result Value Ref Range   SARS Coronavirus 2 by RT PCR NEGATIVE NEGATIVE    Comment: (NOTE) SARS-CoV-2 target nucleic acids are NOT DETECTED. The SARS-CoV-2 RNA is generally detectable in upper respiratoy specimens during the acute phase of infection. The lowest concentration of SARS-CoV-2 viral copies this assay can detect is 131 copies/mL. A negative result does not preclude SARS-Cov-2 infection and should not be used as the sole basis for treatment or other patient management decisions. A negative result may occur with  improper specimen collection/handling, submission of specimen other than nasopharyngeal swab, presence of viral mutation(s) within the areas targeted by this assay, and inadequate number  of viral copies (<131 copies/mL). A negative result must be combined with clinical observations, patient history, and epidemiological information. The expected result is Negative. Fact Sheet for Patients:  https://www.moore.com/ Fact Sheet for Healthcare Providers:  https://www.young.biz/ This test is not yet ap proved or cleared by the [place] FDA and  has been authorized for detection and/or diagnosis of SARS-CoV-2 by FDA under an Emergency Use Authorization (EUA). This EUA will remain  in  effect (meaning this test can be used) for the duration of the COVID-19 declaration under Section 564(b)(1) of the Act, 21 U.S.C. section 360bbb-3(b)(1), unless the authorization is terminated or revoked sooner.    Influenza A by PCR NEGATIVE NEGATIVE   Influenza B by PCR NEGATIVE NEGATIVE    Comment: (NOTE) The Xpert Xpress SARS-CoV-2/FLU/RSV assay is intended as an aid in  the diagnosis of influenza from Nasopharyngeal swab specimens and  should not be used as a sole basis for treatment. Nasal washings and  aspirates are unacceptable for Xpert Xpress SARS-CoV-2/FLU/RSV  testing. Fact Sheet for Patients: https://www.moore.com/ Fact Sheet for Healthcare Providers: https://www.young.biz/ This test is not yet approved or cleared by the [place] FDA and  has been authorized for detection and/or diagnosis of SARS-CoV-2 by  FDA under an Emergency Use Authorization (EUA). This EUA will remain  in effect (meaning this test can be used) for the duration of the  Covid-19 declaration under Section 564(b)(1) of the Act, 21  U.S.C. section 360bbb-3(b)(1), unless the authorization is  terminated or revoked. Performed at [facility], [address], [place], [place] [address]   Acetaminophen level     Status: Abnormal   Collection Time: 09/23/19  6:38 PM  Result Value Ref Range   Acetaminophen (Tylenol), Serum 133 (H) 10 - 30 ug/mL    Comment: (NOTE) Therapeutic concentrations vary significantly. A range of 10-30 ug/mL  may be an effective concentration for many patients. However, some  are best treated at concentrations outside of this range. Acetaminophen concentrations >150 ug/mL at 4 hours after ingestion  and >50 ug/mL at 12 hours after ingestion are often associated with  toxic reactions. Performed at [facility], [address] [place], [place] [address]   HIV Antibody (routine testing w rflx)     Status: None    Collection Time: 09/23/19  6:38 PM  Result Value Ref Range   HIV Screen 4th Generation wRfx NON REACTIVE NON REACTIVE    Comment: Performed at [facility], [address] [place], [place] [address]  Urinalysis, Complete w Microscopic     Status: Abnormal   Collection Time: 09/24/19  1:12 AM  Result Value Ref Range   Color, Urine AMBER (A) YELLOW    Comment: BIOCHEMICALS MAY BE AFFECTED BY COLOR   APPearance CLEAR (A) CLEAR   Specific Gravity, Urine >1.046 (H) 1.005 - 1.030   pH 5.0 5.0 - 8.0   Glucose, UA NEGATIVE NEGATIVE mg/dL   Hgb urine dipstick NEGATIVE NEGATIVE   Bilirubin Urine NEGATIVE NEGATIVE   Ketones, ur 5 (A) NEGATIVE mg/dL   Protein, ur NEGATIVE NEGATIVE mg/dL   Nitrite NEGATIVE NEGATIVE   Leukocytes,Ua NEGATIVE NEGATIVE   RBC / HPF 0-5 0 - 5 RBC/hpf   WBC, UA 0-5 0 - 5 WBC/hpf   Bacteria, UA NONE SEEN NONE SEEN   Squamous Epithelial / LPF 0-5 0 - 5   Mucus PRESENT     Comment: Performed at [facility], [address], [place], [place]  [address]  Urine Drug Screen, Qualitative     Status: Abnormal   Collection Time: 09/24/19  1:12 AM  Result Value Ref Range   Tricyclic, Ur Screen NONE DETECTED NONE DETECTED   Amphetamines, Ur Screen NONE DETECTED NONE DETECTED   MDMA (Ecstasy)Ur Screen NONE DETECTED NONE DETECTED   Cocaine Metabolite,Ur [place] NONE DETECTED NONE DETECTED   Opiate, Ur Screen NONE DETECTED NONE DETECTED   Phencyclidine (PCP) Ur S NONE DETECTED NONE DETECTED   Cannabinoid 50 Ng, Ur [place] POSITIVE (A) NONE DETECTED   Barbiturates, Ur Screen NONE DETECTED NONE DETECTED   Benzodiazepine, Ur Scrn NONE DETECTED NONE DETECTED   Methadone Scn, Ur NONE DETECTED NONE DETECTED    Comment: (NOTE) Tricyclics + metabolites, urine    Cutoff 1000 ng/mL Amphetamines + metabolites, urine  Cutoff 1000 ng/mL MDMA (Ecstasy), urine              Cutoff 500 ng/mL Cocaine Metabolite, urine          Cutoff 300 ng/mL Opiate + metabolites, urine        Cutoff  300 ng/mL Phencyclidine (PCP), urine         Cutoff 25 ng/mL Cannabinoid, urine                 Cutoff 50 ng/mL Barbiturates + metabolites, urine  Cutoff 200 ng/mL Benzodiazepine, urine              Cutoff 200 ng/mL Methadone, urine                   Cutoff 300 ng/mL The urine drug screen provides only a preliminary, unconfirmed analytical test result and should not be used for non-medical purposes. Clinical consideration and professional judgment should be applied to any positive drug screen result due to possible interfering substances. A more specific alternate chemical method must be used in order to obtain a confirmed analytical result. Gas chromatography / mass spectrometry (GC/MS) is the preferred confirmat ory method. Performed at [facility], [address] [place], [place] [address]   Pregnancy, urine     Status: None   Collection Time: 09/24/19  1:12 AM  Result Value Ref Range   Preg Test, Ur NEGATIVE NEGATIVE    Comment: Performed at [facility], [address] [place], [place] [address]  CBC with Differential/Platelet     Status: Abnormal   Collection Time: 09/24/19  5:31 AM  Result Value Ref Range   WBC 10.7 (H) 4.0 - 10.5 K/uL   RBC 3.81 (L) 3.87 - 5.11 MIL/uL   Hemoglobin 10.9 (L) 12.0 - 15.0 g/dL   HCT 53.6 (L) 64.4 - 03.4 %   MCV 85.8 80.0 - 100.0 fL   MCH 28.6 26.0 - 34.0 pg   MCHC 33.3 30.0 - 36.0 g/dL   RDW 74.2 59.5 - 63.8 %   Platelets 257 150 - 400 K/uL   nRBC 0.0 0.0 - 0.2 %   Neutrophils Relative % 73 %   Neutro Abs 7.7 1.7 - 7.7 K/uL   Lymphocytes Relative 19 %   Lymphs Abs 2.0 0.7 - 4.0 K/uL   Monocytes Relative 7 %   Monocytes Absolute 0.7 0.1 - 1.0 K/uL   Eosinophils Relative 1 %   Eosinophils Absolute 0.1 0.0 - 0.5 K/uL   Basophils Relative 0 %   Basophils Absolute 0.0 0.0 - 0.1 K/uL   Immature Granulocytes 0 %   Abs Immature Granulocytes 0.04 0.00 - 0.07 K/uL  Comment: Performed at [facility], [address] [place], [place] [address]  Protime-INR     Status: Abnormal   Collection Time: 09/24/19  5:31 AM  Result Value Ref Range   Prothrombin Time 15.5 (H) 11.4 - 15.2 seconds   INR 1.2 0.8 - 1.2    Comment: (NOTE) INR goal varies based on device and disease states. Performed at [facility], [address] [place], [place] [address]   Comprehensive metabolic panel     Status: Abnormal   Collection Time: 09/24/19  5:31 AM  Result Value Ref Range   Sodium 139 135 - 145 mmol/L   Potassium 3.2 (L) 3.5 - 5.1 mmol/L   Chloride 108 98 - 111 mmol/L   CO2 24 22 - 32 mmol/L   Glucose, Bld 87 70 - 99 mg/dL    Comment: Glucose reference range applies only to samples taken after fasting for at least 8 hours.   BUN 11 6 - 20 mg/dL   Creatinine, Ser 1.09 0.44 - 1.00 mg/dL   Calcium 8.3 (L) 8.9 - 10.3 mg/dL   Total Protein 5.7 (L) 6.5 - 8.1 g/dL   Albumin 3.0 (L) 3.5 - 5.0 g/dL   AST 323 (H) 15 - 41 U/L   ALT 221 (H) 0 - 44 U/L   Alkaline Phosphatase 60 38 - 126 U/L   Total Bilirubin 0.8 0.3 - 1.2 mg/dL   GFR calc non Af Amer >60 >60 mL/min   GFR calc Af Amer >60 >60 mL/min   Anion gap 7 5 - 15    Comment: Performed at [facility], [address] [place], [place] [address]  Magnesium     Status: None   Collection Time: 09/24/19  5:31 AM  Result Value Ref Range   Magnesium 2.4 1.7 - 2.4 mg/dL    Comment: Performed at [facility], [address] [place], [place] [address]  Phosphorus     Status: Abnormal   Collection Time: 09/24/19  5:31 AM  Result Value Ref Range   Phosphorus 2.3 (L) 2.5 - 4.6 mg/dL    Comment: Performed at [facility], [address], [place], [place] [address]    Current Facility-Administered Medications  Medication Dose Route Frequency Provider Last Rate Last Admin  . 0.9 %  sodium chloride infusion   Intravenous Continuous Lurene Shadow, MD 75 mL/hr at 09/23/19 1912 New Bag at 09/23/19 1912  . acetylcysteine  (MUCOMYST) 20 % nebulizer / oral solution 5,240 mg  70 mg/kg Oral Q4H Lurene Shadow, MD   5,240 mg at 09/24/19 0641  . ondansetron (ZOFRAN) injection 4 mg  4 mg Intravenous Q6H PRN Lurene Shadow, MD        Musculoskeletal: Psychiatric Specialty Exam: Physical Exam  Review of Systems  Blood pressure 119/75, pulse 81, temperature 97.7 F (36.5 C), temperature source Oral, resp. rate 15, height 5\' 4"  (1.626 m), weight 74.8 kg, SpO2 93 %.Body mass index is 28.32 kg/m.  General Appearance: Casual  Eye Contact:  Good  Speech:  Normal Rate  Volume:  Normal  Mood:  Depressed and Hopeless  Affect:  Congruent  Thought Process:  Coherent and Goal Directed  Orientation:  Full (Time, Place, and Person)  Thought Content:  Logical  Suicidal Thoughts:  Yes.  with intent/plan  Homicidal Thoughts:  No  Memory:  Immediate;   Good  Judgement:  Impaired  Insight:  Fair  Psychomotor Activity:  Normal  Concentration:  Concentration: Good  Recall:  Good  Fund of Knowledge:  Good  Language:  Good  Akathisia:  No  Handed:  Ambidextrous  AIMS (if indicated):     Assets:  Desire for Improvement Vocational/Educational  ADL's:  Intact  Cognition:  WNL  Sleep:      I spoke with the patient about the recent event as well as her past history.  I also spoke with her physician and nurse regarding this hospitalization and potential future care on an inpatient basis.  The patient describes a clear and intended suicide attempt in which she repeatedly took multiple handfuls of acetaminophen followed by attempts at taking some of her other pills with a clear intent and plan to kill herself.  She states that this was not an impulsive act but actually after a ongoing assessment of other options and decided that suicide was her best option.  Prior to this event she describes a fairly standard manic episode with just a couple hours of sleep times several days.  She feels that this was likely due to mostly to the  social stresses but that she also had been less compliant with her antimanic medications.  She has good outpatient follow-up with a psychiatrist and had talked with her but apparently she did not seek out any help prior to the overdose attempt.  Since the hospitalization she has not found that there would have been a better solution or that it is avoidable.  The multiple stresses include family ex-husband and her mother's dementia all of which remain unchanged.  She does see the value of getting back to her work and her education however she does not see a path to accomplish that going forward.  She does not have a specific suicidal plan in the hospital however she said that she saw no reason why she would not immediately take pills if she were discharged from the hospital.  Treatment Plan Summary: Medication management on an inpatient basis.  Her current treatment with oxycarbazine is appropriate and is likely to be effective for further control of her mania.  As mentioned above it is more likely that noncompliance was the cause versus a breakthrough from appropriate treatment as an outpatient.  She is receiving lorazepam to control anxiety.  This is a fine short-term solution however it may be more advantageous to switch to a longer acting antianxiety agent to reduce the on-off effect of anxiolytic medications.  Clonazepam would likely thus be preferable.  She is currently with a sitter and I believe this is appropriate and should be continued until she can be discharged medically.  Disposition: Recommend psychiatric Inpatient admission when medically cleared. Due to the above safety issues and hx bipolar disorder, inpatient treatment for her psychiatric condition is warranted.   Alesia Morin, MD 09/24/2019 12:09 PM

## 2019-09-24 NOTE — Progress Notes (Signed)
Progress Note    Kelly Carr  ZWC:[id number] DOB: Nov 10, 1970  DOA: 09/23/2019 PCP: Leeroy Bock, DO      Brief Narrative:    Medical records reviewed and are as summarized below:  Kelly Carr is an 49 y.o. adult  medical history significant for depression, bipolar disorder, anxiety, asthma, who was brought to the hospital after she took several pills of Tylenol and then attempt to kill herself.  She said it was a big bottle of arthritis Tylenol she took all but 10 tablets in the bottle.  Reportedly, she also took 1 carbamazepine and was not able to she did not mention this to me.  She felt and in her life was the best thing to do under the circumstances.  She said she has issues with depression and had been prescribed medications about 6 months ago.  However, she says she has not been medically adherent and had not taken her medicines "for months".  She said she has 2 children, an 10 year old and a 11 year old.  She also has a brother and a mother.    She was admitted to the hospital for acetaminophen overdose and suicide attempt.  She was treated with acetylcysteine per acetaminophen overdose protocol.  She was hypotensive on admission and she was also treated with IV fluids.  She had hypokalemia that was repleted.  She was seen in consultation by the psychiatrist who recommended transfer to behavioral unit once patient is medically stable.      Assessment/Plan:   Principal Problem:   Severe bipolar I disorder, current or most recent episode mixed (HCC) Active Problems:   Hypotension   Acetaminophen overdose, intentional self-harm, initial encounter (HCC)    Intentional acetaminophen overdose/suicide attempt in a patient with bipolar disorder, anxiety and depression:  She has completed treatment with acetylcysteine per protocol.  Poison control and pharmacist have signed off the case.  Appreciate psychiatrist input.  Plan to transfer to behavioral health unit  tomorrow.  Patient is agreeable with this plan.  Of note, patient is under involuntary commitment.  Continue one-to-one sitter at bedside  Hypotension: BP has improved.  Discontinue IV fluids and monitor BP closely  Hypokalemia: Replete potassium and monitor level  Elevated liver enzymes: This is likely due to acetaminophen overdose.  Levels are improving.  Continue to monitor.  Diarrhea: Likely due to acetaminophen overdose.  Monitor for resolution  Body mass index is 28.32 kg/m.   Family Communication/Anticipated D/C date and plan/Code Status   DVT prophylaxis: Lovenox Code Status: Full code Family Communication: None.  Patient does not want her family to be notified about her condition. Disposition Plan:    Status is: Inpatient  Remains inpatient appropriate because:Inpatient level of care appropriate due to severity of illness   Dispo: The patient is from: Home              Anticipated d/c is to: Behavioral health unit              Anticipated d/c date is: 1 day              Patient currently is not medically stable to d/c.            Subjective:   She complains of diarrhea.  She said she had about 4 watery stools this morning.  She has mild abdominal pain and she vomited last night.  She was more talkative today and was trying to talk about the problems she's having and why  she tried to end her life.  Objective:    Vitals:   09/24/19 0527 09/24/19 0648 09/24/19 0800 09/24/19 0900  BP: 118/66 (!) 105/52 126/75 119/75  Pulse: 91 72 78 81  Resp: 17 17 16 15   Temp:      TempSrc:      SpO2: 96% 98% 98% 93%  Weight:      Height:       No data found.  No intake or output data in the 24 hours ending 09/24/19 1722 Filed Weights   09/23/19 1443  Weight: 74.8 kg    Exam:  GEN: NAD SKIN: No rash EYES: EOMI ENT: MMM CV: RRR PULM: CTA B ABD: soft, ND, NT, +BS CNS: AAO x 3, non focal EXT: No edema or tenderness   Data Reviewed:   I have  personally reviewed following labs and imaging studies:  Labs: Labs show the following:   Basic Metabolic Panel: Recent Labs  Lab 09/23/19 1513 09/24/19 0531  NA 139 139  K 3.6 3.2*  CL 104 108  CO2 25 24  GLUCOSE 128* 87  BUN 11 11  CREATININE 0.70 0.51  CALCIUM 8.8* 8.3*  MG  --  2.4  PHOS  --  2.3*   GFR Estimated Creatinine Clearance (by C-G formula based on SCr of 0.51 mg/dL) Female: 87 mL/min Female: 106.7 mL/min Liver Function Tests: Recent Labs  Lab 09/23/19 1513 09/24/19 0531 09/24/19 1418  AST 39 226* 140*  ALT 41 221* 209*  ALKPHOS 68 60  --   BILITOT 0.5 0.8  --   PROT 6.7 5.7*  --   ALBUMIN 3.9 3.0*  --    No results for input(s): LIPASE, AMYLASE in the last 168 hours. No results for input(s): AMMONIA in the last 168 hours. Coagulation profile Recent Labs  Lab 09/23/19 1513 09/24/19 0531  INR 1.0 1.2    CBC: Recent Labs  Lab 09/23/19 1513 09/24/19 0531  WBC 8.6 10.7*  NEUTROABS 6.2 7.7  HGB 11.3* 10.9*  HCT 35.4* 32.7*  MCV 88.1 85.8  PLT 287 257   Cardiac Enzymes: No results for input(s): CKTOTAL, CKMB, CKMBINDEX, TROPONINI in the last 168 hours. BNP (last 3 results) No results for input(s): PROBNP in the last 8760 hours. CBG: No results for input(s): GLUCAP in the last 168 hours. D-Dimer: No results for input(s): DDIMER in the last 72 hours. Hgb A1c: No results for input(s): HGBA1C in the last 72 hours. Lipid Profile: No results for input(s): CHOL, HDL, LDLCALC, TRIG, CHOLHDL, LDLDIRECT in the last 72 hours. Thyroid function studies: No results for input(s): TSH, T4TOTAL, T3FREE, THYROIDAB in the last 72 hours.  Invalid input(s): FREET3 Anemia work up: No results for input(s): VITAMINB12, FOLATE, FERRITIN, TIBC, IRON, RETICCTPCT in the last 72 hours. Sepsis Labs: Recent Labs  Lab 09/23/19 1513 09/24/19 0531  WBC 8.6 10.7*    Microbiology Recent Results (from the past 240 hour(s))  Respiratory Panel by RT PCR (Flu  A&B, Covid) - Nasopharyngeal Swab     Status: None   Collection Time: 09/23/19  5:14 PM   Specimen: Nasopharyngeal Swab  Result Value Ref Range Status   SARS Coronavirus 2 by RT PCR NEGATIVE NEGATIVE Final    Comment: (NOTE) SARS-CoV-2 target nucleic acids are NOT DETECTED. The SARS-CoV-2 RNA is generally detectable in upper respiratoy specimens during the acute phase of infection. The lowest concentration of SARS-CoV-2 viral copies this assay can detect is 131 copies/mL. A negative result does not preclude  SARS-Cov-2 infection and should not be used as the sole basis for treatment or other patient management decisions. A negative result may occur with  improper specimen collection/handling, submission of specimen other than nasopharyngeal swab, presence of viral mutation(s) within the areas targeted by this assay, and inadequate number of viral copies (<131 copies/mL). A negative result must be combined with clinical observations, patient history, and epidemiological information. The expected result is Negative. Fact Sheet for Patients:  https://www.moore.com/ Fact Sheet for Healthcare Providers:  https://www.young.biz/ This test is not yet ap proved or cleared by the [place] FDA and  has been authorized for detection and/or diagnosis of SARS-CoV-2 by FDA under an Emergency Use Authorization (EUA). This EUA will remain  in effect (meaning this test can be used) for the duration of the COVID-19 declaration under Section 564(b)(1) of the Act, 21 U.S.C. section 360bbb-3(b)(1), unless the authorization is terminated or revoked sooner.    Influenza A by PCR NEGATIVE NEGATIVE Final   Influenza B by PCR NEGATIVE NEGATIVE Final    Comment: (NOTE) The Xpert Xpress SARS-CoV-2/FLU/RSV assay is intended as an aid in  the diagnosis of influenza from Nasopharyngeal swab specimens and  should not be used as a sole basis for treatment. Nasal washings  and  aspirates are unacceptable for Xpert Xpress SARS-CoV-2/FLU/RSV  testing. Fact Sheet for Patients: https://www.moore.com/ Fact Sheet for Healthcare Providers: https://www.young.biz/ This test is not yet approved or cleared by the [place] FDA and  has been authorized for detection and/or diagnosis of SARS-CoV-2 by  FDA under an Emergency Use Authorization (EUA). This EUA will remain  in effect (meaning this test can be used) for the duration of the  Covid-19 declaration under Section 564(b)(1) of the Act, 21  U.S.C. section 360bbb-3(b)(1), unless the authorization is  terminated or revoked. Performed at [facility], [address] [place], [place] [address]     Procedures and diagnostic studies:  No results found.  Medications:    Continuous Infusions: . sodium chloride 75 mL/hr at 09/24/19 1257     LOS: 1 day   Kelly Carr  [facility]     09/24/2019, 5:22 PM

## 2019-09-24 NOTE — ED Notes (Signed)
Pt given sandwich tray and glass of water.

## 2019-09-24 NOTE — ED Notes (Signed)
Pt gave Clinical research associate her jewelry. Pt jewelry to include: 3 rings One silver bracelet One beaded bracelet One necklace Pair of heart earrings Choker necklace Pt also has duffel bag of clothes Iphone Purse

## 2019-09-24 NOTE — Progress Notes (Signed)
[place] for Acetylcysteine(NAC) IV/PO Indication:  APAP overdose  Allergies  Allergen Reactions  . Haldol [Haloperidol Lactate] Other (See Comments)    Syncope   . Risperidone And Related Other (See Comments)    Zones out  . Tramadol Itching  . Valproic Acid     Patient Measurements: Height: 5\' 4"  (162.6 cm) Weight: 74.8 kg (165 lb) IBW/kg (Calculated) : 54.7 Adjusted Body Weight:    Vital Signs: BP: 119/75 (04/20 0900) Pulse Rate: 81 (04/20 0900) Intake/Output from previous day: No intake/output data recorded. Intake/Output from this shift: No intake/output data recorded.  Labs: Recent Labs    09/23/19 1513 09/24/19 0531 09/24/19 1418  WBC 8.6 10.7*  --   HGB 11.3* 10.9*  --   HCT 35.4* 32.7*  --   PLT 287 257  --   CREATININE 0.70 0.51  --   MG  --  2.4  --   PHOS  --  2.3*  --   ALBUMIN 3.9 3.0*  --   PROT 6.7 5.7*  --   AST 39 226* 140*  ALT 41 221* 209*  ALKPHOS 68 60  --   BILITOT 0.5 0.8  --   BILIDIR <0.1  --   --   IBILI NOT CALCULATED  --   --    Estimated Creatinine Clearance (by C-G formula based on SCr of 0.51 mg/dL) Female: 58 mL/min Female: 106.7 mL/min   Microbiology: Recent Results (from the past 720 hour(s))  Respiratory Panel by RT PCR (Flu A&B, Covid) - Nasopharyngeal Swab     Status: None   Collection Time: 09/23/19  5:14 PM   Specimen: Nasopharyngeal Swab  Result Value Ref Range Status   SARS Coronavirus 2 by RT PCR NEGATIVE NEGATIVE Final    Comment: (NOTE) SARS-CoV-2 target nucleic acids are NOT DETECTED. The SARS-CoV-2 RNA is generally detectable in upper respiratoy specimens during the acute phase of infection. The lowest concentration of SARS-CoV-2 viral copies this assay can detect is 131 copies/mL. A negative result does not preclude SARS-Cov-2 infection and should not be used as the sole basis for treatment or other patient management decisions. A negative result may occur with   improper specimen collection/handling, submission of specimen other than nasopharyngeal swab, presence of viral mutation(s) within the areas targeted by this assay, and inadequate number of viral copies (<131 copies/mL). A negative result must be combined with clinical observations, patient history, and epidemiological information. The expected result is Negative. Fact Sheet for Patients:  PinkCheek.be Fact Sheet for Healthcare Providers:  GravelBags.it This test is not yet ap proved or cleared by the [place] FDA and  has been authorized for detection and/or diagnosis of SARS-CoV-2 by FDA under an Emergency Use Authorization (EUA). This EUA will remain  in effect (meaning this test can be used) for the duration of the COVID-19 declaration under Section 564(b)(1) of the Act, 21 U.S.C. section 360bbb-3(b)(1), unless the authorization is terminated or revoked sooner.    Influenza A by PCR NEGATIVE NEGATIVE Final   Influenza B by PCR NEGATIVE NEGATIVE Final    Comment: (NOTE) The Xpert Xpress SARS-CoV-2/FLU/RSV assay is intended as an aid in  the diagnosis of influenza from Nasopharyngeal swab specimens and  should not be used as a sole basis for treatment. Nasal washings and  aspirates are unacceptable for Xpert Xpress SARS-CoV-2/FLU/RSV  testing. Fact Sheet for Patients: PinkCheek.be Fact Sheet for Healthcare Providers: GravelBags.it This test is not yet approved or  cleared by the [place] and  has been authorized for detection and/or diagnosis of SARS-CoV-2 by  FDA under an Emergency Use Authorization (EUA). This EUA will remain  in effect (meaning this test can be used) for the duration of the  Covid-19 declaration under Section 564(b)(1) of the Act, 21  U.S.C. section 360bbb-3(b)(1), unless the authorization is  terminated or revoked. Performed at  [facility], [address], [place], [place] [address]     Medical History: Past Medical History:  Diagnosis Date  . Abnormal Pap smear    Unknown results>colpo>normal  . Anxiety   . Arthritis   . Asthma   . Bipolar 1 disorder (HCC)   . Depression     Medications:  Scheduled:  . acetylcysteine  70 mg/kg Oral Q4H   Infusions:  . sodium chloride 75 mL/hr at 09/24/19 1257    Assessment: 49 yo F who ingested APAP (whole bottle of prescription tylenol which was prescribed for arthritis ) at about 1100. Poison control recommendations per RN note: 4 hr post ingestion acetaminophen level & CMP -EKG now -If tylenol level is > 75 at 4 hour mark, repeat in 3 hours and poison control will guide [place] from there  Plan:  4/19 1513 APAP level= 232, salicylate <7.0, INR 1.0  ASt 39  ALT 41 Will order AST/ALT and APAP level 22 hours after start of NAC tx as per protocol. Currently MD has ordered NAC ORALLY 140mg /kg x 1 loading dose  then 70 mg/kg every 4 hours. (per order set it says x 17 doses but not currently ordered that way). F/u with # doses .  PEr RN note, since APAP level 4 hrs after ingestion was >75 then will recheck level in 3 hours  4/19 1838 APAP level= 133.   Per RN note:Poison control called to check in with patient. Their suggestions are to continue with at least 5 dose of acetylcysteine orally before considering stopping and to do acteaminophen level, ast/alt levels at 24 hours post first blood draw. Will notify MD.   4/20 1500 APAP level <10, AST/ALT starting to trend down from peak as seen above  Spoke with 5/20 RN at poison control, ok to stop N-acetylcysteine dosing (after loading dose + 4 doses @ q4h).   Poison control signs off at this time.  Pharmacy will also sign off at this time.  Verlon Au, PharmD, BCPS Clinical Pharmacist 09/24/2019 3:12 PM

## 2019-09-25 ENCOUNTER — Encounter: Payer: Self-pay | Admitting: Psychiatry

## 2019-09-25 ENCOUNTER — Inpatient Hospital Stay
Admission: AD | Admit: 2019-09-25 | Discharge: 2019-09-30 | DRG: 885 | Disposition: A | Discharge status: Home / Self Care | Payer: No Typology Code available for payment source | Source: Intra-hospital | Attending: Psychiatry | Admitting: Psychiatry

## 2019-09-25 ENCOUNTER — Telehealth: Payer: Self-pay | Admitting: Physician Assistant

## 2019-09-25 ENCOUNTER — Other Ambulatory Visit: Payer: Self-pay

## 2019-09-25 DIAGNOSIS — F419 Anxiety disorder, unspecified: Secondary | ICD-10-CM | POA: Diagnosis present

## 2019-09-25 DIAGNOSIS — Z915 Personal history of self-harm: Secondary | ICD-10-CM

## 2019-09-25 DIAGNOSIS — F3163 Bipolar disorder, current episode mixed, severe, without psychotic features: Secondary | ICD-10-CM | POA: Diagnosis not present

## 2019-09-25 DIAGNOSIS — Z818 Family history of other mental and behavioral disorders: Secondary | ICD-10-CM

## 2019-09-25 DIAGNOSIS — Z8249 Family history of ischemic heart disease and other diseases of the circulatory system: Secondary | ICD-10-CM

## 2019-09-25 DIAGNOSIS — Z888 Allergy status to other drugs, medicaments and biological substances status: Secondary | ICD-10-CM | POA: Diagnosis not present

## 2019-09-25 DIAGNOSIS — F17210 Nicotine dependence, cigarettes, uncomplicated: Secondary | ICD-10-CM | POA: Diagnosis present

## 2019-09-25 DIAGNOSIS — Z833 Family history of diabetes mellitus: Secondary | ICD-10-CM

## 2019-09-25 DIAGNOSIS — F3160 Bipolar disorder, current episode mixed, unspecified: Secondary | ICD-10-CM | POA: Diagnosis present

## 2019-09-25 DIAGNOSIS — J45909 Unspecified asthma, uncomplicated: Secondary | ICD-10-CM | POA: Diagnosis present

## 2019-09-25 LAB — COMPREHENSIVE METABOLIC PANEL
ALT: 153 U/L — ABNORMAL HIGH (ref 0–44)
AST: 74 U/L — ABNORMAL HIGH (ref 15–41)
Albumin: 2.8 g/dL — ABNORMAL LOW (ref 3.5–5.0)
Alkaline Phosphatase: 57 U/L (ref 38–126)
Anion gap: 4 — ABNORMAL LOW (ref 5–15)
BUN: 6 mg/dL (ref 6–20)
CO2: 22 mmol/L (ref 22–32)
Calcium: 7.9 mg/dL — ABNORMAL LOW (ref 8.9–10.3)
Chloride: 112 mmol/L — ABNORMAL HIGH (ref 98–111)
Creatinine, Ser: 0.54 mg/dL (ref 0.44–1.00)
GFR calc Af Amer: >60 mL/min (ref >60)
GFR calc non Af Amer: >60 mL/min (ref >60)
Glucose, Bld: 121 mg/dL — ABNORMAL HIGH (ref 70–99)
Potassium: 3.6 mmol/L (ref 3.5–5.1)
Sodium: 138 mmol/L (ref 135–145)
Total Bilirubin: 0.2 mg/dL — ABNORMAL LOW (ref 0.3–1.2)
Total Protein: 5.2 g/dL — ABNORMAL LOW (ref 6.5–8.1)

## 2019-09-25 LAB — PROTIME-INR
INR: 1.1 (ref 0.8–1.2)
Prothrombin Time: 14.4 seconds (ref 11.4–15.2)

## 2019-09-25 MED ORDER — MAGNESIUM HYDROXIDE 400 MG/5ML PO SUSP: 30.0000 mL | Freq: Every day | ORAL | Status: DC | PRN

## 2019-09-25 MED ORDER — ACETAMINOPHEN 325 MG PO TABS: 650.0000 mg | ORAL_TABLET | Freq: Four times a day (QID) | ORAL | Status: DC | PRN

## 2019-09-25 MED ORDER — ALUM & MAG HYDROXIDE-SIMETH 200-200-20 MG/5ML PO SUSP: 30.0000 mL | ORAL | Status: DC | PRN

## 2019-09-25 MED ORDER — OXCARBAZEPINE 300 MG PO TABS
600.0000 mg | ORAL_TABLET | Freq: Two times a day (BID) | ORAL | Status: DC
  Administered 2019-09-25: 600 mg via ORAL
  Filled 2019-09-25 (×2): qty 2

## 2019-09-25 NOTE — Progress Notes (Signed)
RN administered medication to this pt. Pt stated that she wanted a different RN. RN asked pt why, pt stating that she feels that RN is not attentive enough to her. RN informed her that she has a Comptroller at bedside 24/7 to attend to her and RN has come in to meet pt needs as well. RN alerted charge RN that pt wants a different RN. Charge states that there is not another RN that can pick up this pt at this time. Staff is just waiting for this pt to transfer to inpatient psych. WCTM.

## 2019-09-25 NOTE — BH Assessment (Signed)
Patient is to be admitted to [facility] by Dr. Toni Amend.  Attending Physician will be. Cindy Hazy, MD.   Patient has been assigned to room 304, by [facility] Charge Nurse Maryelizabeth Kaufmann, RN.   Intake Paper Work has been signed and placed on patient chart.  ER staff is aware of the admission: 1. Martie Lee, ER Secretary  2. Victorino Dike Patient's Charge Nurse  3. THO Patient Access.
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
## 2019-09-25 NOTE — Progress Notes (Signed)
Report given to RN Matt at 2020. Pt will be transferring to Rm 322. Registration informed. Pt and her 1:1 sitter were informed of the transfer. Pt left at 2120 with Security and 1:1 sitter. IV access removed. AVS and 4part IVC paperwork were sent with the pt. Pt denied suicidal ideation and verbally contacted for safety. Pt reported that she would notify staff if any thoughts or plans too hurt herself or others as appropriate. Pt displayed a normal, mood congruent affect at time of discharge. Staff will continue to monitor for safety, mood, and response to treatment.

## 2019-09-25 NOTE — Progress Notes (Signed)
Pt continues to call out for RN to step into room. RN has been in room several times to address pt's needs and give pt updates regarding POC. Pt is ambulatory and 1:1 sitter at bedside. WCTM.
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
## 2019-09-25 NOTE — Progress Notes (Signed)
Nutrition Brief Note  Patient identified on the Malnutrition Screening Tool (MST) Report  49 y.o. adult  medical history significant for depression, bipolar disorder, anxiety and asthma who is admitted after suicide attempt  Wt Readings from Last 15 Encounters:  09/23/19 74.8 kg  10/31/18 72.6 kg  10/30/18 72.6 kg  10/16/18 68.5 kg  10/15/18 72.6 kg  10/15/18 70.3 kg  10/08/18 68.9 kg  10/07/18 70.3 kg  06/13/18 67.1 kg  04/12/18 66.2 kg  04/06/18 64.9 kg  03/20/18 60.8 kg  09/28/17 60.3 kg  09/14/17 59 kg  09/13/17 60.3 kg    Body mass index is 28.32 kg/m. Patient meets criteria for obesity based on current BMI.   Current diet order is regular, patient is consuming approximately 100% of meals at this time. Labs and medications reviewed.   No nutrition interventions warranted at this time. If nutrition issues arise, please consult RD.   Betsey Holiday MS, RD, LDN Please refer to [facility] for RD and/or RD on-call/weekend/after hours pager

## 2019-09-25 NOTE — Telephone Encounter (Signed)
Pt was admitted into [facility] for Suicide attempt. Pt wants to know if [name] can call her today about her aftercare. Hospital also wants to speak to you. They want to know what you think about her staying at [facility] or them moving her to [facility].

## 2019-09-25 NOTE — Progress Notes (Signed)
RN to room with another RN as witness because pt very verbally aggressive toward this RN. Pt yelling at RN and flipped RN off, stating that she was offended by care given here. RN escalated to charge RN.

## 2019-09-25 NOTE — Plan of Care (Signed)
Patient new to the unit tonight  Problem: Education: Goal: Knowledge of [facility] Education information/materials will improve Outcome: Not Progressing Goal: Emotional status will improve Outcome: Not Progressing Goal: Mental status will improve Outcome: Not Progressing Goal: Verbalization of understanding the information provided will improve Outcome: Not Progressing   Problem: Safety: Goal: Periods of time without injury will increase Outcome: Not Progressing   Problem: Education: Goal: Ability to make informed decisions regarding treatment will improve Outcome: Not Progressing   Problem: Health Behavior/Discharge Planning: Goal: Identification of resources available to assist in meeting health care needs will improve Outcome: Not Progressing

## 2019-09-25 NOTE — Telephone Encounter (Signed)
Pt called back asking you to call her cell # [phone]

## 2019-09-25 NOTE — Discharge Summary (Signed)
Physician Discharge Summary  Patient ID: Kelly Carr MRN: [id number] DOB/AGE: Jan 16, 1971 49 y.o.  Admit date: 09/23/2019 Discharge date: 09/25/2019  Admission Diagnoses: Intentional acetaminophen overdose Suicide attempt Hypotension secondary to overdose Hypokalemia Acute hepatitis Diarrhea Discharge Diagnoses:  Principal Problem:   Severe bipolar I disorder, current or most recent episode mixed (HCC) Active Problems:   Acetaminophen overdose, intentional self-harm, initial encounter (HCC)   Hypotension   Discharged Condition: good  Hospital Course: Patient is a 49 year old female with history of depression bipolar, anxiety and asthma who was brought to the hospital after taking 10 tablets of Tylenol in attempt of suicide. She had elevated liver function panel.  After spoke with poison control, patient was placed on acetylcysteine.  She has completed the course.  Tylenol level had dropped down, liver function has been improved.  She has been seen by psychiatry, she has been accepted to go to psychiatry unit.  Consults: psychiatry  Significant Diagnostic Studies: Acetaminophen level, liver function  Treatments: IV fluids and acetylcysteine.  Discharge Exam: Blood pressure 127/69, pulse 78, temperature 98.2 F (36.8 C), temperature source Oral, resp. rate 16, height 5\' 4"  (1.626 m), weight 74.8 kg, SpO2 100 %. General appearance: alert, cooperative and no distress Resp: clear to auscultation bilaterally Cardio: regular rate and rhythm, S1, S2 normal, no murmur, click, rub or gallop GI: soft, non-tender; bowel sounds normal; no masses,  no organomegaly Extremities: extremities normal, atraumatic, no cyanosis or edema  Disposition: Discharge disposition: 65-Discharged/transferred to Psychiatric Hospital or Psychiatric Unit/Distinct Part of Hospital       Discharge Instructions    Diet - low sodium heart healthy   Complete by: As directed    Increase activity slowly    Complete by: As directed      Allergies as of 09/25/2019      Reactions   Haldol [haloperidol Lactate] Other (See Comments)   Syncope   Risperidone And Related Other (See Comments)   Zones out   Tramadol Itching   Valproic Acid       Medication List    TAKE these medications   LORazepam 1 MG tablet Commonly known as: ATIVAN Take 0.5-1 tablets (0.5-1 mg total) by mouth 2 (two) times daily as needed for anxiety.   oxcarbazepine 600 MG tablet Commonly known as: TRILEPTAL Take by mouth.   polyethylene glycol powder 17 GM/SCOOP powder Commonly known as: GLYCOLAX/MIRALAX Take by mouth.   SM Nicotine Polacrilex 2 MG gum Generic drug: nicotine polacrilex        Signed: 09/27/2019 09/25/2019, 4:07 PM

## 2019-09-26 DIAGNOSIS — F3163 Bipolar disorder, current episode mixed, severe, without psychotic features: Secondary | ICD-10-CM

## 2019-09-26 MED ORDER — WHITE PETROLATUM EX OINT
TOPICAL_OINTMENT | CUTANEOUS | Status: AC
  Filled 2019-09-26: qty 5

## 2019-09-26 MED ORDER — HYDROCERIN EX CREA
TOPICAL_CREAM | Freq: Two times a day (BID) | CUTANEOUS | Status: DC
  Administered 2019-09-28: 1 via TOPICAL
  Filled 2019-09-26: qty 113

## 2019-09-26 MED ORDER — LIDOCAINE-BENZALKONIUM 2.5-0.13 % EX LIQD
10.0000 mL | Freq: Two times a day (BID) | CUTANEOUS | Status: DC
  Administered 2019-09-28 – 2019-09-29 (×2): 10 mL via TOPICAL

## 2019-09-26 MED ORDER — OXCARBAZEPINE 300 MG PO TABS
600.0000 mg | ORAL_TABLET | Freq: Two times a day (BID) | ORAL | Status: DC
  Administered 2019-09-26 – 2019-09-30 (×8): 600 mg via ORAL
  Filled 2019-09-26 (×8): qty 2

## 2019-09-26 MED ORDER — NICOTINE 21 MG/24HR TD PT24
21.0000 mg | MEDICATED_PATCH | Freq: Every day | TRANSDERMAL | Status: DC
  Administered 2019-09-27 – 2019-09-30 (×4): 21 mg via TRANSDERMAL
  Filled 2019-09-26 (×5): qty 1

## 2019-09-26 MED ORDER — BACITRACIN-NEOMYCIN-POLYMYXIN OINTMENT TUBE
TOPICAL_OINTMENT | Freq: Two times a day (BID) | CUTANEOUS | Status: DC
  Administered 2019-09-28: 1 via TOPICAL
  Filled 2019-09-26: qty 14.17

## 2019-09-26 MED ORDER — LORAZEPAM 1 MG PO TABS
1.0000 mg | ORAL_TABLET | Freq: Two times a day (BID) | ORAL | Status: DC | PRN
  Administered 2019-09-26 – 2019-09-29 (×4): 1 mg via ORAL
  Filled 2019-09-26 (×4): qty 1

## 2019-09-26 MED ORDER — ARIPIPRAZOLE ER 400 MG IM SRER
400.0000 mg | INTRAMUSCULAR | Status: DC
  Administered 2019-09-27: 400 mg via INTRAMUSCULAR
  Filled 2019-09-26: qty 2

## 2019-09-26 NOTE — Plan of Care (Signed)
The patient is calm and cooperative.  Problem: Education: Goal: Emotional status will improve Outcome: Progressing Goal: Mental status will improve Outcome: Progressing   
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
## 2019-09-26 NOTE — Progress Notes (Signed)
BRIEF PHARMACY NOTE   This patient attended and participated in Medication Management Group counseling led by [facility] staff pharmacist.  This interactive class reviews basic information about prescription medications and education on personal responsibility in medication management.  The class also includes general knowledge of 3 main classes of behavioral medications, including antipsychotics, antidepressants, and mood stabilizers.     Patient behavior was appropriate for group setting.   Educational materials sourced from:  "Medication Do's and Don'ts" from [email or web address]   "Mental Health Medications" from [facility] [email or web address]     Albina Billet ,PharmD 09/26/2019 , 3:04 PM

## 2019-09-26 NOTE — [facility] Suicide Risk Assessment (Signed)
[facility] Admission Suicide Risk Assessment   Nursing information obtained from:  Patient Demographic factors:  NA Current Mental Status:  NA Loss Factors:  NA Historical Factors:  Prior suicide attempts Risk Reduction Factors:  NA  Total Time spent with patient: 1 hour Principal Problem: Bipolar affective disorder, current episode mixed ([place]) Diagnosis:  Principal Problem:   Bipolar affective disorder, current episode mixed ([place])  Subjective Data: Patient seen chart reviewed.  49 year old woman with a history of bipolar disorder.  Came to the hospital after taking an overdose of acetaminophen with suicidal intent.  Patient has medically cleared from the overdose but continues to be very depressed down and sad negative.  She now denies any suicidal intent or wish to die but still feels hopeless.  She is not psychotic and has good insight and is cooperative with treatment.  Continued Clinical Symptoms:  Alcohol Use Disorder Identification Test Final Score (AUDIT): 4 The "Alcohol Use Disorders Identification Test", Guidelines for Use in Primary Care, Second Edition.  World Pharmacologist [facility]). Score between 0-7:  no or low risk or alcohol related problems. Score between 8-15:  moderate risk of alcohol related problems. Score between 16-19:  high risk of alcohol related problems. Score 20 or above:  warrants further diagnostic evaluation for alcohol dependence and treatment.   CLINICAL FACTORS:   Bipolar Disorder:   Mixed State   Musculoskeletal: Strength & Muscle Tone: within normal limits Gait & Station: normal Patient leans: N/A  Psychiatric Specialty Exam: Physical Exam  Nursing note and vitals reviewed. Constitutional: She appears well-developed and well-nourished.  HENT:  Head: Normocephalic and atraumatic.  Eyes: Pupils are equal, round, and reactive to light. Conjunctivae are normal.  Cardiovascular: Regular rhythm and normal heart sounds.  Respiratory: Effort normal.   GI: Soft.  Musculoskeletal:        General: Normal range of motion.     Cervical back: Normal range of motion.  Neurological: She is alert.  Skin: Skin is warm and dry.  Psychiatric: Her speech is normal. She is slowed. Cognition and memory are normal. She expresses impulsivity. She exhibits a depressed mood. She expresses suicidal ideation. She expresses no suicidal plans.    Review of Systems  Constitutional: Negative.   HENT: Negative.   Eyes: Negative.   Respiratory: Negative.   Cardiovascular: Negative.   Gastrointestinal: Negative.   Musculoskeletal: Negative.   Skin: Negative.   Neurological: Negative.   Psychiatric/Behavioral: Positive for dysphoric mood, sleep disturbance and suicidal ideas. The patient is nervous/anxious.     Blood pressure 133/73, pulse 74, temperature 98.4 F (36.9 C), temperature source Oral, resp. rate 17, height 5\' 4"  (1.626 m), weight 74.8 kg, SpO2 98 %.Body mass index is 28.31 kg/m.  General Appearance: Casual  Eye Contact:  Good  Speech:  Slow  Volume:  Decreased  Mood:  Depressed  Affect:  Constricted  Thought Process:  Coherent  Orientation:  Full (Time, Place, and Person)  Thought Content:  Logical  Suicidal Thoughts:  Yes.  without intent/plan  Homicidal Thoughts:  No  Memory:  Immediate;   Fair Recent;   Fair Remote;   Fair  Judgement:  Fair  Insight:  Good  Psychomotor Activity:  Decreased  Concentration:  Concentration: Fair  Recall:  [employer] of Knowledge:  Fair  Language:  Fair  Akathisia:  No  Handed:  Right  AIMS (if indicated):     Assets:  Desire for Improvement Physical Health Social Support  ADL's:  Intact  Cognition:  WNL  Sleep:  Number of Hours: 6.5      COGNITIVE FEATURES THAT CONTRIBUTE TO RISK:  Polarized thinking    SUICIDE RISK:   Mild:  Suicidal ideation of limited frequency, intensity, duration, and specificity.  There are no identifiable plans, no associated intent, mild dysphoria and related  symptoms, good self-control (both objective and subjective assessment), few other risk factors, and identifiable protective factors, including available and accessible social support.  PLAN OF CARE: Patient continues to have passive suicidal ideation but is agreeable to treatment plan.  Continue 15-minute checks.  Restart medication.  Engage in individual and group therapy and assessment.  Work with patient on safe discharge planning.  I certify that inpatient services furnished can reasonably be expected to improve the patient's condition.   Mordecai Rasmussen, MD 09/26/2019, 11:35 AM

## 2019-09-26 NOTE — Tx Team (Addendum)
Interdisciplinary Treatment and Diagnostic Plan Update  09/26/2019 Time of Session: 9am Kelly Carr MRN: [id number]  Principal Diagnosis: Bipolar affective disorder, current episode mixed [facility])  Secondary Diagnoses: Principal Problem:   Bipolar affective disorder, current episode mixed ([place])   Current Medications:  Current Facility-Administered Medications  Medication Dose Route Frequency Provider Last Rate Last Admin  . acetaminophen (TYLENOL) tablet 650 mg  650 mg Oral Q6H PRN [name], [name], MD      . alum & mag hydroxide-simeth (MAALOX/MYLANTA) 200-200-20 MG/5ML suspension 30 mL  30 mL Oral Q4H PRN [name], Madie Reno, MD      . Derrill Memo ON 09/27/2019] ARIPiprazole ER (ABILIFY MAINTENA) injection 400 mg  400 mg Intramuscular Q28 days [name], Madie Reno, MD      . hydrocerin (EUCERIN) cream   Topical BID [name], [name], MD      . Lidocaine-Benzalkonium 2.5-0.13 % LIQD 10 mL  10 mL Topical BID [name], [name], MD      . LORazepam (ATIVAN) tablet 1 mg  1 mg Oral BID PRN [name], [name], MD      . magnesium hydroxide (MILK OF MAGNESIA) suspension 30 mL  30 mL Oral Daily PRN [name], [name], MD      . neomycin-bacitracin-polymyxin (NEOSPORIN) ointment   Topical BID [name], [name], MD      . nicotine (NICODERM CQ - dosed in mg/24 hours) patch 21 mg  21 mg Transdermal Daily [name], Madie Reno, MD   Stopped at 09/26/19 0831  . Oxcarbazepine (TRILEPTAL) tablet 600 mg  600 mg Oral BID [name], Madie Reno, MD       PTA Medications: Medications Prior to Admission  Medication Sig Dispense Refill Last Dose  . LORazepam (ATIVAN) 1 MG tablet Take 0.5-1 tablets (0.5-1 mg total) by mouth 2 (two) times daily as needed for anxiety. 30 tablet 0   . oxcarbazepine (TRILEPTAL) 600 MG tablet Take by mouth.     . polyethylene glycol powder (GLYCOLAX/MIRALAX) 17 GM/SCOOP powder Take by mouth.     . SM NICOTINE POLACRILEX 2 MG gum        Patient Stressors:    Patient Strengths:    Treatment Modalities: Medication  Management, Group therapy, Case management,  1 to 1 session with clinician, Psychoeducation, Recreational therapy.   Physician Treatment Plan for Primary Diagnosis: Bipolar affective disorder, current episode mixed ([place]) Long Term Goal(s): Improvement in symptoms so as ready for discharge Improvement in symptoms so as ready for discharge   Short Term Goals: Ability to verbalize feelings will improve Ability to disclose and discuss suicidal ideas Ability to demonstrate self-control will improve Ability to maintain clinical measurements within normal limits will improve Compliance with prescribed medications will improve  Medication Management: Evaluate patient's response, side effects, and tolerance of medication regimen.  Therapeutic Interventions: 1 to 1 sessions, Unit Group sessions and Medication administration.  Evaluation of Outcomes: Not Met  Physician Treatment Plan for Secondary Diagnosis: Principal Problem:   Bipolar affective disorder, current episode mixed ([place])  Long Term Goal(s): Improvement in symptoms so as ready for discharge Improvement in symptoms so as ready for discharge   Short Term Goals: Ability to verbalize feelings will improve Ability to disclose and discuss suicidal ideas Ability to demonstrate self-control will improve Ability to maintain clinical measurements within normal limits will improve Compliance with prescribed medications will improve     Medication Management: Evaluate patient's response, side effects, and tolerance of medication regimen.  Therapeutic Interventions: 1 to 1 sessions,  Unit Group sessions and Medication administration.  Evaluation of Outcomes: Not Met   RN Treatment Plan for Primary Diagnosis: Bipolar affective disorder, current episode mixed ([place]) Long Term Goal(s): Knowledge of disease and therapeutic regimen to maintain health will improve  Short Term Goals: Ability to demonstrate self-control, Ability to participate in  decision making will improve, Ability to verbalize feelings will improve, Ability to disclose and discuss suicidal ideas, Ability to identify and develop effective coping behaviors will improve and Compliance with prescribed medications will improve  Medication Management: RN will administer medications as ordered by provider, will assess and evaluate patient's response and provide education to patient for prescribed medication. RN will report any adverse and/or side effects to prescribing provider.  Therapeutic Interventions: 1 on 1 counseling sessions, Psychoeducation, Medication administration, Evaluate responses to treatment, Monitor vital signs and CBGs as ordered, Perform/monitor CIWA, COWS, AIMS and Fall Risk screenings as ordered, Perform wound care treatments as ordered.  Evaluation of Outcomes: Not Met   LCSW Treatment Plan for Primary Diagnosis: Bipolar affective disorder, current episode mixed ([place]) Long Term Goal(s): Safe transition to appropriate next level of care at discharge, Engage patient in therapeutic group addressing interpersonal concerns.  Short Term Goals: Engage patient in aftercare planning with referrals and resources  Therapeutic Interventions: Assess for all discharge needs, 1 to 1 time with Social worker, Explore available resources and support systems, Assess for adequacy in community support network, Educate family and significant other(s) on suicide prevention, Complete Psychosocial Assessment, Interpersonal group therapy.  Evaluation of Outcomes: Not Met   Progress in Treatment: Attending groups: No. Participating in groups: No. Taking medication as prescribed: Yes. Toleration medication: Yes. Family/Significant other contact made: Yes, individual(s) contacted:  with pt; declined family contact Patient understands diagnosis: Yes. Discussing patient identified problems/goals with staff: Yes. Medical problems stabilized or resolved: No. Denies  suicidal/homicidal ideation: No. Issues/concerns per patient self-inventory: No. Other: NA  New problem(s) identified: No, Describe:  None reported  New Short Term/Long Term Goal(s):Attend outpatient treatment, develop healthy coping methods and take medication as prescribed.  Patient Goals:  "Get back on meds"  Discharge Plan or Barriers: Pt will return home and follow up with outpatient provider.  Reason for Continuation of Hospitalization: Anxiety Depression Medication stabilization  Estimated Length of Stay:1-7 days  Recreational Therapy: Patient Stressors: N/A Patient Goal: Patient will engage in groups without prompting or encouragement from LRT x3 group sessions within 5 recreation therapy group sessions  Attendees: Patient:Kelly Carr 09/26/2019 11:47 AM  Physician: Alethia Berthold 09/26/2019 11:47 AM  Nursing: Collier Bullock 09/26/2019 11:47 AM  RN Care Manager: 09/26/2019 11:47 AM  Social Worker: Anise Salvo 09/26/2019 11:47 AM  Recreational Therapist: Isaias Sakai Jannetta Massey 09/26/2019 11:47 AM  Other:  09/26/2019 11:47 AM  Other:  09/26/2019 11:47 AM  Other: 09/26/2019 11:47 AM    Scribe for Treatment Team: Yvette Rack, LCSW 09/26/2019 11:47 AM

## 2019-09-26 NOTE — [facility] Suicide Risk Assessment (Signed)
[facility] INPATIENT:  Family/Significant Other Suicide Prevention Education  Suicide Prevention Education:  Patient Refusal for Family/Significant Other Suicide Prevention Education: The patient Kelly Carr has refused to provide written consent for family/significant other to be provided Family/Significant Other Suicide Prevention Education during admission and/or prior to discharge.  Physician notified.  Kelly Carr T Gracianna Vink 09/26/2019, 11:43 AM

## 2019-09-26 NOTE — [facility] Group Notes (Signed)
[facility] Group Notes:  (Nursing/MHT/Case Management/Adjunct)  Date:  09/26/2019  Time:  2:40 PM  Type of Therapy:  Community Meeting  Participation Level:  Active  Participation Quality:  Appropriate, Attentive and Supportive  Affect:  Appropriate  Cognitive:  Alert and Appropriate  Insight:  Appropriate  Engagement in Group:  Engaged  Modes of Intervention:  Discussion, Education and Support  Summary of Progress/Problems:  Kelly Carr 09/26/2019, 2:40 PM

## 2019-09-26 NOTE — [facility] Group Notes (Signed)
LCSW Group Therapy Note  09/26/2019 1:00 PM  Type of Therapy/Topic:  Group Therapy:  Balance in Life  Participation Level:  Active  Description of Group:    This group will address the concept of balance and how it feels and looks when one is unbalanced. Patients will be encouraged to process areas in their lives that are out of balance and identify reasons for remaining unbalanced. Facilitators will guide patients in utilizing problem-solving interventions to address and correct the stressor making their life unbalanced. Understanding and applying boundaries will be explored and addressed for obtaining and maintaining a balanced life. Patients will be encouraged to explore ways to assertively make their unbalanced needs known to significant others in their lives, using other group members and facilitator for support and feedback.  Therapeutic Goals: 1. Patient will identify two or more emotions or situations they have that consume much of in their lives. 2. Patient will identify signs/triggers that life has become out of balance:  3. Patient will identify two ways to set boundaries in order to achieve balance in their lives:  4. Patient will demonstrate ability to communicate their needs through discussion and/or role plays  Summary of Patient Progress: Patient was present at group.  Patient shared how she uses the beach as a relaxing place.  She also reports that she engages in deep breathing.    Therapeutic Modalities:   Cognitive Behavioral Therapy Solution-Focused Therapy Assertiveness Training  Penni Homans MSW, [place] 09/26/2019 2:29 PM

## 2019-09-26 NOTE — Progress Notes (Signed)
F - Stabilize mood.  D - The patient awakened for breakfast, accepted morning medications, and remained visible in the milieu.  Mood was appropriate to the situation.  The patient did not report any significant concerns on her PSI-A (Anxiety a 5).   Medications were accepted as ordered and PRN Ativan was given at patient request.  Food and fluid intake adequate.    A - Medications provided as ordered.  Kelly Carr was cooperative and pleasant, displaying a stable mood.  No concerns to note.   R - Continue with treatment.

## 2019-09-26 NOTE — Progress Notes (Signed)
Recreation Therapy Notes  Date: 09/26/2019  Time: 9:30 am  Location: Room 21   Behavioral response: Appropriate   Intervention Topic: Animal Assisted Therapy   Discussion/Intervention:  Animal Assisted Therapy took place today during group.  Animal Assisted Therapy is the planned inclusion of an animal in a patient's treatment plan. The patients were able to engage in therapy with an animal during group. Participants were educated on what a service dog is and the different between a support dog and a service dog. Patient were informed on the many animal needs there are and how their needs are similar. Individuals were enlightened on the process to get a service animal or support animal. Patients got the opportunity to pet the animal and were offered emotional support from the animal and staff.  Clinical Observations/Feedback:  Patient came to group late due to being with peer support.  Patient came to group and was on topic and was focused on what peers and staff had to say. Participant shared their experiences and history with animals. Individual was social with peers, staff and animal while participating in group. She left group early due to unknown reasons.  Kelly Carr LRT/CTRS         Kelly Carr 09/26/2019 11:15 AM

## 2019-09-26 NOTE — Telephone Encounter (Signed)
Left patient a voice mail

## 2019-09-26 NOTE — Telephone Encounter (Signed)
Thanks for calling her Traci.  Can I do anything to help her?

## 2019-09-26 NOTE — Progress Notes (Signed)
Recreation Therapy Notes  INPATIENT RECREATION THERAPY ASSESSMENT  Patient Details Name: Kelly Carr MRN: [id number] DOB: [birth year] Today's Date: 09/26/2019       Information Obtained From: Patient  Able to Participate in Assessment/Interview: Yes  Patient Presentation: Responsive  Reason for Admission (Per Patient): Active Symptoms, Suicidal Ideation, Suicide Attempt, Med Non-Compliance  Patient Stressors: Family, Relationship  Coping Skills:   Music, Exercise, Prayer, Read, Dance  Leisure Interests (2+):  Exercise - Walking, Exercise - Running, Individual - Reading, Music - Singing  Frequency of Recreation/Participation: Weekly  Awareness of Community Resources:     [employer]:     Current Use:    If no, Barriers?:    Expressed Interest in [employer] Information:    [place] of Residence:  Film/video editor  Patient Main Form of Transportation: Set designer  Patient Strengths:  Compassion, giving  Patient Identified Areas of Improvement:  Putting self-first  Patient Goal for Hospitalization:  Get back on my medication  Current SI (including self-harm):  No  Current HI:  No  Current AVH: No  Staff Intervention Plan: Group Attendance, Collaborate with Interdisciplinary Treatment Team  Consent to Intern Participation: N/A  Kelly Carr 09/26/2019, 3:19 PM

## 2019-09-27 ENCOUNTER — Ambulatory Visit: Payer: Self-pay | Admitting: Physician Assistant

## 2019-09-27 LAB — COMPREHENSIVE METABOLIC PANEL
ALT: 131 U/L — ABNORMAL HIGH (ref 0–44)
AST: 44 U/L — ABNORMAL HIGH (ref 15–41)
Albumin: 3.7 g/dL (ref 3.5–5.0)
Alkaline Phosphatase: 60 U/L (ref 38–126)
Anion gap: 7 (ref 5–15)
BUN: 7 mg/dL (ref 6–20)
CO2: 26 mmol/L (ref 22–32)
Calcium: 8.7 mg/dL — ABNORMAL LOW (ref 8.9–10.3)
Chloride: 106 mmol/L (ref 98–111)
Creatinine, Ser: 0.48 mg/dL (ref 0.44–1.00)
GFR calc Af Amer: >60 mL/min (ref >60)
GFR calc non Af Amer: >60 mL/min (ref >60)
Glucose, Bld: 106 mg/dL — ABNORMAL HIGH (ref 70–99)
Potassium: 3.7 mmol/L (ref 3.5–5.1)
Sodium: 139 mmol/L (ref 135–145)
Total Bilirubin: 0.5 mg/dL (ref 0.3–1.2)
Total Protein: 6.4 g/dL — ABNORMAL LOW (ref 6.5–8.1)

## 2019-09-27 NOTE — [facility] Group Notes (Signed)
Overcoming Obstacles  09/27/2019 1PM  Type of Therapy and Topic:  Group Therapy:  Overcoming Obstacles  Participation Level:  Active    Description of Group:    In this group patients will be encouraged to explore what they see as obstacles to their own wellness and recovery. They will be guided to discuss their thoughts, feelings, and behaviors related to these obstacles. The group will process together ways to cope with barriers, with attention given to specific choices patients can make. Each patient will be challenged to identify changes they are motivated to make in order to overcome their obstacles. This group will be process-oriented, with patients participating in exploration of their own experiences as well as giving and receiving support and challenge from other group members.   Therapeutic Goals: 1. Patient will identify personal and current obstacles as they relate to admission. 2. Patient will identify barriers that currently interfere with their wellness or overcoming obstacles.  3. Patient will identify feelings, thought process and behaviors related to these barriers. 4. Patient will identify two changes they are willing to make to overcome these obstacles:      Summary of Patient Progress Actively and appropriately engaged in the group. Patient was able to provide support and validation to other group members.Patient practiced active listening when interacting with the facilitator and other group members.Patient demonstrated good insight and respected boundaries during session. Patient identified discord with her family as an obstacle she is working on Nurse, children's.      Therapeutic Modalities:   Cognitive Behavioral Therapy Solution Focused Therapy Motivational Interviewing Relapse Prevention Therapy    Kelly Carr, MSW, LCSW 09/27/2019 2:40 PM

## 2019-09-27 NOTE — Plan of Care (Signed)
Patient stated she was feeling better and that she had a good day on the unit  Problem: Education: Goal: Emotional status will improve Outcome: Progressing Goal: Mental status will improve Outcome: Progressing

## 2019-09-27 NOTE — Progress Notes (Signed)
Recreation Therapy Notes  Date: 09/27/2019  Time: 9:30 am  Location: Craft Room  Behavioral response: Appropriate   Intervention Topic: Communication   Discussion/Intervention:  Group content today was focused on communication. The group defined communication and ways to communicate with others. Individuals stated reason why communication is important and some reasons to communicate with others. Patients expressed if they thought they were good at communicating with others and ways they could improve their communication skills. The group identified important parts of communication and some experiences they have had in the past with communication. The group participated in the intervention "What is that?", where they had a chance to test out their communication skills and identify ways to improve their communication techniques.  Clinical Observations/Feedback:  Patient came to group and expressed that communication is important so others can know what you are going through. She stated that without proper communication a misunderstanding could occur. Participant identified aggressive communication as yelling and fighting. Individual was social with peers and staff while participating in the intervention.   Yolande Skoda LRT/CTRS        Kelly Carr 09/27/2019 12:08 PM

## 2019-09-27 NOTE — Telephone Encounter (Signed)
Thanks

## 2019-09-27 NOTE — Plan of Care (Signed)
D- Patient alert and oriented. Patient presents in a pleasant mood on assessment stating that she slept good last night and had no complaints to voice to this Clinical research associate. Patient denies SI, HI, AVH, and pain at this time. Patient also denies any signs/symptoms of depression/anxiety, stating that she is "blessed to be alive, blessed to be here". Patient's goal for today is "staying focused on getting well and going home and getting back to work", in which she will "pray, read, go to groups, meditate", in order to achieve her goal.   A- Scheduled medications administered to patient, per MD orders. Support and encouragement provided.  Routine safety checks conducted every 15 minutes.  Patient informed to notify staff with problems or concerns.  R- No adverse drug reactions noted. Patient contracts for safety at this time. Patient compliant with medications and treatment plan. Patient receptive, calm, and cooperative. Patient interacts well with others on the unit.  Patient remains safe at this time.  Problem: Education: Goal: Knowledge of [place] [facility] Education information/materials will improve Outcome: Progressing Goal: Emotional status will improve Outcome: Progressing Goal: Mental status will improve Outcome: Progressing Goal: Verbalization of understanding the information provided will improve Outcome: Progressing   Problem: Safety: Goal: Periods of time without injury will increase Outcome: Progressing   Problem: Education: Goal: Ability to make informed decisions regarding treatment will improve Outcome: Progressing   Problem: Health Behavior/Discharge Planning: Goal: Identification of resources available to assist in meeting health care needs will improve Outcome: Progressing

## 2019-09-27 NOTE — Progress Notes (Signed)
Patient was in her room upon arrival to the unit. Patient pleasant during assessment denying SI/HI/AVH and pain. Patient endorses anxiety and depression but stated they are getting better. Patient compliant with medication administration per MD orders. Patient given education, support and encouragement to be active in her treatment plan. Patient observed by this Clinical research associate interacting appropriately with staff and peers on the unit. Pt being monitored Q 15 minutes for safety per unit protocol. Pt remains safe on the unit.

## 2019-09-27 NOTE — Progress Notes (Signed)
[facility] MD Progress Note  09/27/2019 12:05 PM Kelly Carr  MRN:  [id number] Subjective: Patient seen chart reviewed.  Follow-up for this patient with bipolar disorder with mixed depressive symptoms.  Patient tells me today she is feeling much better.  She got her Abilify injection.  She is taking her Trileptal.  She slept well last night.  Patient denies any suicidal or homicidal thoughts.  Appears to be lucid and taking care of her ADLs.  She is somewhat anxious still and especially anxious about her plan for the future.  No new physical problems or complaints. Principal Problem: Bipolar affective disorder, current episode mixed ([place]) Diagnosis: Principal Problem:   Bipolar affective disorder, current episode mixed ([place])  Total Time spent with patient: 30 minutes  Past Psychiatric History: Patient has a history of bipolar disorder with both manic and depressive symptoms including psychosis in the past and behavior that has put her at significant danger.  Past Medical History:  Past Medical History:  Diagnosis Date  . Abnormal Pap smear    Unknown results>colpo>normal  . Anxiety   . Arthritis   . Asthma   . Bipolar 1 disorder ([place])   . Depression     Past Surgical History:  Procedure Laterality Date  . NO PAST SURGERIES     Family History:  Family History  Problem Relation Age of Onset  . Diabetes Mother   . Hypertension Mother   . Heart disease Mother 8  . Schizophrenia Mother   . Diabetes Maternal Grandmother   . Heart disease Maternal Grandmother   . Diabetes Maternal Grandfather   . Heart disease Maternal Grandfather   . Bipolar disorder Cousin   . Bipolar disorder Nephew   . Depression Daughter    Family Psychiatric  History: See previous Social History:  Social History   Substance and Sexual Activity  Alcohol Use Yes  . Alcohol/week: 1.0 - 2.0 standard drinks  . Types: 1 - 2 Glasses of wine per week     Social History   Substance and Sexual Activity  Drug  Use Not Currently  . Types: Marijuana   Comment: last was about a month ago    Social History   Socioeconomic History  . Marital status: Single    Spouse name: Not on file  . Number of children: Not on file  . Years of education: Not on file  . Highest education level: Not on file  Occupational History  . Not on file  Tobacco Use  . Smoking status: Current Some Day Smoker    Packs/day: 0.50    Types: Cigarettes  . Smokeless tobacco: Never Used  Substance and Sexual Activity  . Alcohol use: Yes    Alcohol/week: 1.0 - 2.0 standard drinks    Types: 1 - 2 Glasses of wine per week  . Drug use: Not Currently    Types: Marijuana    Comment: last was about a month ago  . Sexual activity: Yes    Partners: Male    Birth control/protection: None  Other Topics Concern  . Not on file  Social History Narrative  . Not on file   Social Determinants of Health   Financial Resource Strain:   . Difficulty of Paying Living Expenses:   Food Insecurity:   . Worried About Charity fundraiser in the Last Year:   . Arboriculturist in the Last Year:   Transportation Needs:   . Film/video editor (Medical):   Marland Kitchen Lack  of Transportation (Non-Medical):   Physical Activity:   . Days of Exercise per Week:   . Minutes of Exercise per Session:   Stress:   . Feeling of Stress :   Social Connections:   . Frequency of Communication with Friends and Family:   . Frequency of Social Gatherings with Friends and Family:   . Attends Religious Services:   . Active Member of Clubs or Organizations:   . Attends Banker Meetings:   Marland Kitchen Marital Status:    Additional Social History:                         Sleep: Fair  Appetite:  Fair  Current Medications: Current Facility-Administered Medications  Medication Dose Route Frequency Provider Last Rate Last Admin  . acetaminophen (TYLENOL) tablet 650 mg  650 mg Oral Q6H PRN Asa Baudoin T, MD      . alum & mag hydroxide-simeth  (MAALOX/MYLANTA) 200-200-20 MG/5ML suspension 30 mL  30 mL Oral Q4H PRN Britnay Magnussen T, MD      . ARIPiprazole ER (ABILIFY MAINTENA) injection 400 mg  400 mg Intramuscular Q28 days Kelilah Hebard, [name], MD   400 mg at 09/27/19 1045  . hydrocerin (EUCERIN) cream   Topical BID Hernan Turnage, [name], MD   Stopped at 09/26/19 1414  . Lidocaine-Benzalkonium 2.5-0.13 % LIQD 10 mL  10 mL Topical BID Jarius Dieudonne, [name], MD   Stopped at 09/26/19 1414  . LORazepam (ATIVAN) tablet 1 mg  1 mg Oral BID PRN Mabelle Mungin, [name], MD   1 mg at 09/26/19 2105  . magnesium hydroxide (MILK OF MAGNESIA) suspension 30 mL  30 mL Oral Daily PRN Havilah Topor T, MD      . neomycin-bacitracin-polymyxin (NEOSPORIN) ointment   Topical BID Kataleena Holsapple, [name], MD   Stopped at 09/26/19 1414  . nicotine (NICODERM CQ - dosed in mg/24 hours) patch 21 mg  21 mg Transdermal Daily Arma Reining, [name], MD   21 mg at 09/27/19 6606  . Oxcarbazepine (TRILEPTAL) tablet 600 mg  600 mg Oral BID Beatris Belen, [name], MD   600 mg at 09/27/19 3016    Lab Results:  Results for orders placed or performed during the hospital encounter of 09/25/19 (from the past 48 hour(s))  Comprehensive metabolic panel     Status: Abnormal   Collection Time: 09/27/19  7:15 AM  Result Value Ref Range   Sodium 139 135 - 145 mmol/L   Potassium 3.7 3.5 - 5.1 mmol/L   Chloride 106 98 - 111 mmol/L   CO2 26 22 - 32 mmol/L   Glucose, Bld 106 (H) 70 - 99 mg/dL    Comment: Glucose reference range applies only to samples taken after fasting for at least 8 hours.   BUN 7 6 - 20 mg/dL   Creatinine, Ser 0.10 0.44 - 1.00 mg/dL   Calcium 8.7 (L) 8.9 - 10.3 mg/dL   Total Protein 6.4 (L) 6.5 - 8.1 g/dL   Albumin 3.7 3.5 - 5.0 g/dL   AST 44 (H) 15 - 41 U/L   ALT 131 (H) 0 - 44 U/L   Alkaline Phosphatase 60 38 - 126 U/L   Total Bilirubin 0.5 0.3 - 1.2 mg/dL   GFR calc non Af Amer >60 >60 mL/min   GFR calc Af Amer >60 >60 mL/min   Anion gap 7 5 - 15    Comment: Performed at [facility],  [address] [place], [place] [address]    Blood Alcohol level:  Lab Results  Component Value Date   ETH <10 08/09/2019   ETH <10 10/30/2018    Metabolic Disorder Labs: Lab Results  Component Value Date   HGBA1C 5.2 08/09/2019   MPG 102.54 08/09/2019   MPG 96.8 10/09/2018   Lab Results  Component Value Date   PROLACTIN 39.5 (H) 04/12/2018   PROLACTIN 2.0 12/05/2011   Lab Results  Component Value Date   CHOL 202 (H) 08/09/2019   TRIG 80 08/09/2019   HDL 50 08/09/2019   CHOLHDL 4.0 08/09/2019   VLDL 16 08/09/2019   LDLCALC 136 (H) 08/09/2019   LDLCALC 113 (H) 10/09/2018    Physical Findings: AIMS:  , ,  ,  ,    CIWA:    COWS:     Musculoskeletal: Strength & Muscle Tone: within normal limits Gait & Station: normal Patient leans: N/A  Psychiatric Specialty Exam: Physical Exam  Nursing note and vitals reviewed. Constitutional: She appears well-developed and well-nourished.  HENT:  Head: Normocephalic and atraumatic.  Eyes: Pupils are equal, round, and reactive to light. Conjunctivae are normal.  Cardiovascular: Regular rhythm and normal heart sounds.  Respiratory: Effort normal.  GI: Soft.  Musculoskeletal:        General: Normal range of motion.     Cervical back: Normal range of motion.  Neurological: She is alert.  Skin: Skin is warm and dry.  Psychiatric: Her mood appears anxious. Her speech is delayed. She is slowed. Thought content is not paranoid. Cognition and memory are normal. She expresses impulsivity. She expresses no homicidal and no suicidal ideation.    Review of Systems  Constitutional: Negative.   HENT: Negative.   Eyes: Negative.   Respiratory: Negative.   Cardiovascular: Negative.   Gastrointestinal: Negative.   Musculoskeletal: Negative.   Skin: Negative.   Neurological: Negative.   Psychiatric/Behavioral: The patient is nervous/anxious.     Blood pressure 136/77, pulse 86, temperature 98 F (36.7 C), temperature source Oral,  resp. rate 17, height 5\' 4"  (1.626 m), weight 74.8 kg, SpO2 100 %.Body mass index is 28.31 kg/m.  General Appearance: Casual  Eye Contact:  Fair  Speech:  Clear and Coherent  Volume:  Normal  Mood:  Euthymic  Affect:  Congruent  Thought Process:  Coherent  Orientation:  Full (Time, Place, and Person)  Thought Content:  Logical  Suicidal Thoughts:  No  Homicidal Thoughts:  No  Memory:  Immediate;   Fair Recent;   Fair Remote;   Fair  Judgement:  Fair  Insight:  Fair  Psychomotor Activity:  Normal  Concentration:  Concentration: Fair  Recall:  of Knowledge:  Fair  Language:  Fair  Akathisia:  No  Handed:  Right  AIMS (if indicated):     Assets:  Desire for Improvement Resilience  ADL's:  Intact  Cognition:  WNL  Sleep:  Number of Hours: 5     Treatment Plan Summary: Daily contact with patient to assess and evaluate symptoms and progress in treatment, Medication management and Plan Patient was anxious about discharge today because of some agitation on the ward but is still stabilizing.  I gave her supportive therapy and encouragement.  Continue current Trileptal dose.  Continue involvement on the unit.  I told her it would be safest to plan for Monday as a likely discharge date.  She was satisfied with this.  No other change for today.  Saturday, MD 09/27/2019,  12:05 PM

## 2019-09-28 NOTE — Plan of Care (Signed)
Patient denies SI / HI /AVH. Patient is discharged focus and and working on placement into a shelter. Patient has reached out to [facility] contact this morning. Patient is medication adherent and without somatic complaint.    Problem: Education: Goal: Knowledge of [facility] Education information/materials will improve Outcome: Progressing Goal: Mental status will improve Outcome: Progressing Goal: Verbalization of understanding the information provided will improve Outcome: Progressing

## 2019-09-28 NOTE — Progress Notes (Signed)
[facility] MD Progress Note  09/28/2019 3:30 PM Kelly Carr  MRN:  [id number]   Kelly Carr is a52yo F with history of bipolar disorder, who was admitted with mixed depressive symptoms.   Patient seen.  Chart reviewed. Patient discussed with nursing; no overnight events reported.  Subjective:  Patient reports "I feel very good". Reports she slept the whole night. Her mood is good, she is not depressed, her anxiety is minimal. She denies suicidal or homicidal thoughts. She reports she participates in groups, journal and makes plans plans for the future. "I reconsidered my life. I am planning to take small steps at a time. I am planning to finish some things here and I`d like to move out of the area, my dream is to move back to [place]". She reports she feels safe for the discharge on Monday and that she will follow with her outpatient psychiatrist at [facility].      Principal Problem: Bipolar affective disorder, current episode mixed ([place]) Diagnosis: Principal Problem:   Bipolar affective disorder, current episode mixed ([place])  Total Time spent with patient: 20 minutes  Past Psychiatric History: see H&P  Past Medical History:  Past Medical History:  Diagnosis Date  . Abnormal Pap smear    Unknown results>colpo>normal  . Anxiety   . Arthritis   . Asthma   . Bipolar 1 disorder ([place])   . Depression     Past Surgical History:  Procedure Laterality Date  . NO PAST SURGERIES     Family History:  Family History  Problem Relation Age of Onset  . Diabetes Mother   . Hypertension Mother   . Heart disease Mother 6  . Schizophrenia Mother   . Diabetes Maternal Grandmother   . Heart disease Maternal Grandmother   . Diabetes Maternal Grandfather   . Heart disease Maternal Grandfather   . Bipolar disorder Cousin   . Bipolar disorder Nephew   . Depression Daughter    Family Psychiatric  History: see H&P Social History:  Social History   Substance and Sexual Activity  Alcohol Use Yes  .  Alcohol/week: 1.0 - 2.0 standard drinks  . Types: 1 - 2 Glasses of wine per week     Social History   Substance and Sexual Activity  Drug Use Not Currently  . Types: Marijuana   Comment: last was about a month ago    Social History   Socioeconomic History  . Marital status: Single    Spouse name: Not on file  . Number of children: Not on file  . Years of education: Not on file  . Highest education level: Not on file  Occupational History  . Not on file  Tobacco Use  . Smoking status: Current Some Day Smoker    Packs/day: 0.50    Types: Cigarettes  . Smokeless tobacco: Never Used  Substance and Sexual Activity  . Alcohol use: Yes    Alcohol/week: 1.0 - 2.0 standard drinks    Types: 1 - 2 Glasses of wine per week  . Drug use: Not Currently    Types: Marijuana    Comment: last was about a month ago  . Sexual activity: Yes    Partners: Male    Birth control/protection: None  Other Topics Concern  . Not on file  Social History Narrative  . Not on file   Social Determinants of Health   Financial Resource Strain:   . Difficulty of Paying Living Expenses:   Food Insecurity:   . Worried  About Running Out of Food in the Last Year:   . Ran Out of Food in the Last Year:   Transportation Needs:   . Lack of Transportation (Medical):   Marland Kitchen Lack of Transportation (Non-Medical):   Physical Activity:   . Days of Exercise per Week:   . Minutes of Exercise per Session:   Stress:   . Feeling of Stress :   Social Connections:   . Frequency of Communication with Friends and Family:   . Frequency of Social Gatherings with Friends and Family:   . Attends Religious Services:   . Active Member of Clubs or Organizations:   . Attends Banker Meetings:   Marland Kitchen Marital Status:    Additional Social History:                         Sleep: Good  Appetite:  Good  Current Medications: Current Facility-Administered Medications  Medication Dose Route Frequency  Provider Last Rate Last Admin  . acetaminophen (TYLENOL) tablet 650 mg  650 mg Oral Q6H PRN [name], [name], MD      . alum & mag hydroxide-simeth (MAALOX/MYLANTA) 200-200-20 MG/5ML suspension 30 mL  30 mL Oral Q4H PRN [name], [name], MD      . ARIPiprazole ER (ABILIFY MAINTENA) injection 400 mg  400 mg Intramuscular Q28 days [name], [name], MD   400 mg at 09/27/19 1045  . hydrocerin (EUCERIN) cream   Topical BID [name], [name], MD   1 application at 09/28/19 0757  . Lidocaine-Benzalkonium 2.5-0.13 % LIQD 10 mL  10 mL Topical BID [name], [name], MD   10 mL at 09/28/19 0800  . LORazepam (ATIVAN) tablet 1 mg  1 mg Oral BID PRN [name], [name], MD   1 mg at 09/27/19 2146  . magnesium hydroxide (MILK OF MAGNESIA) suspension 30 mL  30 mL Oral Daily PRN [name], [name], MD      . neomycin-bacitracin-polymyxin (NEOSPORIN) ointment   Topical BID [name], [name], MD   1 application at 09/28/19 0757  . nicotine (NICODERM CQ - dosed in mg/24 hours) patch 21 mg  21 mg Transdermal Daily [name], [name], MD   21 mg at 09/28/19 0756  . Oxcarbazepine (TRILEPTAL) tablet 600 mg  600 mg Oral BID [name], [name], MD   600 mg at 09/28/19 0756    Lab Results:  Results for orders placed or performed during the hospital encounter of 09/25/19 (from the past 48 hour(s))  Comprehensive metabolic panel     Status: Abnormal   Collection Time: 09/27/19  7:15 AM  Result Value Ref Range   Sodium 139 135 - 145 mmol/L   Potassium 3.7 3.5 - 5.1 mmol/L   Chloride 106 98 - 111 mmol/L   CO2 26 22 - 32 mmol/L   Glucose, Bld 106 (H) 70 - 99 mg/dL    Comment: Glucose reference range applies only to samples taken after fasting for at least 8 hours.   BUN 7 6 - 20 mg/dL   Creatinine, Ser 0.25 0.44 - 1.00 mg/dL   Calcium 8.7 (L) 8.9 - 10.3 mg/dL   Total Protein 6.4 (L) 6.5 - 8.1 g/dL   Albumin 3.7 3.5 - 5.0 g/dL   AST 44 (H) 15 - 41 U/L   ALT 131 (H) 0 - 44 U/L   Alkaline Phosphatase 60 38 - 126 U/L   Total Bilirubin 0.5  0.3 - 1.2 mg/dL  GFR calc non Af Amer >60 >60 mL/min   GFR calc Af Amer >60 >60 mL/min   Anion gap 7 5 - 15    Comment: Performed at [facility], [address] [place], [place] [address]    Blood Alcohol level:  Lab Results  Component Value Date   [facility] <10 08/09/2019   ETH <10 10/30/2018    Metabolic Disorder Labs: Lab Results  Component Value Date   HGBA1C 5.2 08/09/2019   MPG 102.54 08/09/2019   MPG 96.8 10/09/2018   Lab Results  Component Value Date   PROLACTIN 39.5 (H) 04/12/2018   PROLACTIN 2.0 12/05/2011   Lab Results  Component Value Date   CHOL 202 (H) 08/09/2019   TRIG 80 08/09/2019   HDL 50 08/09/2019   CHOLHDL 4.0 08/09/2019   VLDL 16 08/09/2019   LDLCALC 136 (H) 08/09/2019   LDLCALC 113 (H) 10/09/2018    Physical Findings: AIMS:  , ,  ,  ,    CIWA:    COWS:     Musculoskeletal: Strength & Muscle Tone: within normal limits Gait & Station: normal Patient leans: N/A  Psychiatric Specialty Exam: Physical Exam  Review of Systems  Blood pressure 136/77, pulse 91, temperature 98 F (36.7 C), temperature source Oral, resp. rate 18, height 5\' 4"  (1.626 m), weight 74.8 kg, SpO2 98 %.Body mass index is 28.31 kg/m.  General Appearance: Fairly Groomed  Eye Contact:  Good  Speech:  Normal Rate  Volume:  Normal  Mood:  Euthymic  Affect:  Congruent and Full Range  Thought Process:  Coherent, Goal Directed and Linear  Orientation:  Full (Time, Place, and Person)  Thought Content:  Logical  Suicidal Thoughts:  No  Homicidal Thoughts:  No  Memory:  Immediate;   Fair Recent;   Fair Remote;   Fair  Judgement:  Fair  Insight:  Fair  Psychomotor Activity:  Normal  Concentration:  Concentration: Fair and Attention Span: Fair  Recall:  of Knowledge:  Fair  Language:  Good  Akathisia:  No  Handed:  Right  AIMS (if indicated):     Assets:  Communication Skills Desire for Improvement Housing Physical Health  ADL's:  Intact   Cognition:  WNL  Sleep:  Number of Hours: 6     Treatment Plan Summary: Daily contact with patient to assess and evaluate symptoms and progress in treatment and Medication management  Continue inpatient psych admission; 15-minute checks; daily contact with patient to assess and evaluate symptoms and progress in treatment; psychoeducation. Patient continues to report mood improvement. No med side effects. Will continue current Trileptal dose. Planning discharge on Monday with outpatient MH f/u at [facility].   [facility], MD 09/28/2019, 3:30 PM

## 2019-09-28 NOTE — Progress Notes (Signed)
Patient quiet and reserved. Can be tearful at times as she tries to process family dynamics. Reports needing and wanting therapy and reports signing up with [facility] services today in hopes that they can help her.  She engages well with peers on the unit and attends group.  Patient received prescribed medication and tolerated without incident. Patient informed to contact staff with any questions or concerns and remains safe on the unit with 15 minute safety checks at this time.

## 2019-09-28 NOTE — Plan of Care (Signed)
  Problem: Education: Goal: Emotional status will improve Outcome: Progressing Goal: Mental status will improve Outcome: Progressing Goal: Verbalization of understanding the information provided will improve Outcome: Progressing   Problem: Education: Goal: Ability to make informed decisions regarding treatment will improve Outcome: Progressing   Problem: Health Behavior/Discharge Planning: Goal: Identification of resources available to assist in meeting health care needs will improve Outcome: Progressing

## 2019-09-28 NOTE — [facility] Group Notes (Signed)
LCSW Group Therapy Note  09/28/2019 5:04 PM  Type of Therapy/Topic:  Group Therapy:  Balance in Life  Participation Level:  Active  Description of Group:    This group will address the concept of balance and how it feels and looks when one is unbalanced. Patients will be encouraged to process areas in their lives that are out of balance and identify reasons for remaining unbalanced. Facilitators will guide patients in utilizing problem-solving interventions to address and correct the stressor making their life unbalanced. Understanding and applying boundaries will be explored and addressed for obtaining and maintaining a balanced life. Patients will be encouraged to explore ways to assertively make their unbalanced needs known to significant others in their lives, using other group members and facilitator for support and feedback.  Therapeutic Goals: 1. Patient will identify two or more emotions or situations they have that consume much of in their lives. 2. Patient will identify signs/triggers that life has become out of balance:  3. Patient will identify two ways to set boundaries in order to achieve balance in their lives:  4. Patient will demonstrate ability to communicate their needs through discussion and/or role plays  Summary of Patient Progress:  The pt identified areas in life that were unbalanced. Pt provided examples of how they can improve areas on the wellness wheel that yields feelings of dissatisfaction. The patient was able to acknowledge the areas that are going well.    Therapeutic Modalities:   Cognitive Behavioral Therapy Solution-Focused Therapy Assertiveness Training  Mont Stonewall Doss MSW, LCSWA Clinical Social Work 09/28/2019 5:04 PM  

## 2019-09-29 NOTE — Progress Notes (Signed)
[facility] MD Progress Note  09/29/2019 12:03 PM Caprina Preisler  MRN:  [id number]   Ms Bolte is a15yo F with history of bipolar disorder, who was admitted with mixed depressive symptoms.   Patient seen.  Chart reviewed. Patient discussed with nursing; no overnight events reported.  Subjective:  Patient reports being in good mood. Denies feeling depressed, suicidal, homicidal.  She participates in groups, does journaling a lot. She denies side effects from medications. She denies hallucinations and does not express delusions. She reports she feels safe for the discharge tomorrow and will follow with her outpatient psychiatrist.      Principal Problem: Bipolar affective disorder, current episode mixed ([place]) Diagnosis: Principal Problem:   Bipolar affective disorder, current episode mixed ([place])  Total Time spent with patient: 20 minutes  Past Psychiatric History: see H&P  Past Medical History:  Past Medical History:  Diagnosis Date  . Abnormal Pap smear    Unknown results>colpo>normal  . Anxiety   . Arthritis   . Asthma   . Bipolar 1 disorder ([place])   . Depression     Past Surgical History:  Procedure Laterality Date  . NO PAST SURGERIES     Family History:  Family History  Problem Relation Age of Onset  . Diabetes Mother   . Hypertension Mother   . Heart disease Mother 51  . Schizophrenia Mother   . Diabetes Maternal Grandmother   . Heart disease Maternal Grandmother   . Diabetes Maternal Grandfather   . Heart disease Maternal Grandfather   . Bipolar disorder Cousin   . Bipolar disorder Nephew   . Depression Daughter    Family Psychiatric  History: see H&P Social History:  Social History   Substance and Sexual Activity  Alcohol Use Yes  . Alcohol/week: 1.0 - 2.0 standard drinks  . Types: 1 - 2 Glasses of wine per week     Social History   Substance and Sexual Activity  Drug Use Not Currently  . Types: Marijuana   Comment: last was about a month ago    Social  History   Socioeconomic History  . Marital status: Single    Spouse name: Not on file  . Number of children: Not on file  . Years of education: Not on file  . Highest education level: Not on file  Occupational History  . Not on file  Tobacco Use  . Smoking status: Current Some Day Smoker    Packs/day: 0.50    Types: Cigarettes  . Smokeless tobacco: Never Used  Substance and Sexual Activity  . Alcohol use: Yes    Alcohol/week: 1.0 - 2.0 standard drinks    Types: 1 - 2 Glasses of wine per week  . Drug use: Not Currently    Types: Marijuana    Comment: last was about a month ago  . Sexual activity: Yes    Partners: Male    Birth control/protection: None  Other Topics Concern  . Not on file  Social History Narrative  . Not on file   Social Determinants of Health   Financial Resource Strain:   . Difficulty of Paying Living Expenses:   Food Insecurity:   . Worried About Charity fundraiser in the Last Year:   . Arboriculturist in the Last Year:   Transportation Needs:   . Film/video editor (Medical):   Marland Kitchen Lack of Transportation (Non-Medical):   Physical Activity:   . Days of Exercise per Week:   . Minutes  of Exercise per Session:   Stress:   . Feeling of Stress :   Social Connections:   . Frequency of Communication with Friends and Family:   . Frequency of Social Gatherings with Friends and Family:   . Attends Religious Services:   . Active Member of Clubs or Organizations:   . Attends Banker Meetings:   Marland Kitchen Marital Status:    Additional Social History:                         Sleep: Good  Appetite:  Good  Current Medications: Current Facility-Administered Medications  Medication Dose Route Frequency Provider Last Rate Last Admin  . acetaminophen (TYLENOL) tablet 650 mg  650 mg Oral Q6H PRN [name], [name], MD      . alum & mag hydroxide-simeth (MAALOX/MYLANTA) 200-200-20 MG/5ML suspension 30 mL  30 mL Oral Q4H PRN [name], [name],  MD      . ARIPiprazole ER (ABILIFY MAINTENA) injection 400 mg  400 mg Intramuscular Q28 days [name], [name], MD   400 mg at 09/27/19 1045  . hydrocerin (EUCERIN) cream   Topical BID [name], [name], MD   Given at 09/29/19 [phone]  . Lidocaine-Benzalkonium 2.5-0.13 % LIQD 10 mL  10 mL Topical BID [name], [name], MD   10 mL at 09/29/19 7371  . LORazepam (ATIVAN) tablet 1 mg  1 mg Oral BID PRN [name], [name], MD   1 mg at 09/28/19 2107  . magnesium hydroxide (MILK OF MAGNESIA) suspension 30 mL  30 mL Oral Daily PRN [name], [name], MD      . neomycin-bacitracin-polymyxin (NEOSPORIN) ointment   Topical BID [name], [name], MD   Given at 09/28/19 1707  . nicotine (NICODERM CQ - dosed in mg/24 hours) patch 21 mg  21 mg Transdermal Daily [name], [name], MD   21 mg at 09/29/19 0626  . Oxcarbazepine (TRILEPTAL) tablet 600 mg  600 mg Oral BID [name], [name], MD   600 mg at 09/29/19 0818    Lab Results:  No results found for this or any previous visit (from the past 48 hour(s)).  Blood Alcohol level:  Lab Results  Component Value Date   ETH <10 08/09/2019   ETH <10 10/30/2018    Metabolic Disorder Labs: Lab Results  Component Value Date   HGBA1C 5.2 08/09/2019   MPG 102.54 08/09/2019   MPG 96.8 10/09/2018   Lab Results  Component Value Date   PROLACTIN 39.5 (H) 04/12/2018   PROLACTIN 2.0 12/05/2011   Lab Results  Component Value Date   CHOL 202 (H) 08/09/2019   TRIG 80 08/09/2019   HDL 50 08/09/2019   CHOLHDL 4.0 08/09/2019   VLDL 16 08/09/2019   LDLCALC 136 (H) 08/09/2019   LDLCALC 113 (H) 10/09/2018    Physical Findings: AIMS:  , ,  ,  ,    CIWA:    COWS:     Musculoskeletal: Strength & Muscle Tone: within normal limits Gait & Station: normal Patient leans: N/A  Psychiatric Specialty Exam: Physical Exam   Review of Systems   Blood pressure 112/65, pulse 86, temperature 97.8 F (36.6 C), temperature source Oral, resp. rate 18, height 5\' 4"  (1.626 m), weight 74.8  kg, SpO2 97 %.Body mass index is 28.31 kg/m.  General Appearance: Fairly Groomed  Eye Contact:  Good  Speech:  Normal Rate  Volume:  Normal  Mood:  Euthymic  Affect:  Congruent and Full Range  Thought Process:  Coherent, Goal Directed and Linear  Orientation:  Full (Time, Place, and Person)  Thought Content:  Logical  Suicidal Thoughts:  No  Homicidal Thoughts:  No  Memory:  Immediate;   Fair Recent;   Fair Remote;   Fair  Judgement:  Fair  Insight:  Fair  Psychomotor Activity:  Normal  Concentration:  Concentration: Fair and Attention Span: Fair  Recall:  [employer] of Knowledge:  Fair  Language:  Good  Akathisia:  No  Handed:  Right  AIMS (if indicated):     Assets:  Communication Skills Desire for Improvement Housing Physical Health  ADL's:  Intact  Cognition:  WNL  Sleep:  Number of Hours: 7     Treatment Plan Summary: Daily contact with patient to assess and evaluate symptoms and progress in treatment and Medication management  Continue inpatient psych admission; 15-minute checks; daily contact with patient to assess and evaluate symptoms and progress in treatment; psychoeducation. Patient continues to report mood improvement. No med side effects. Will continue current Trileptal dose. Planning discharge tomorrow with outpatient MH f/u.   Thalia Party, MD 09/29/2019, 12:03 PM

## 2019-09-29 NOTE — BHH Group Notes (Signed)
LCSW Group Therapy Notes  Date and Time:09/06/19 1:00PM  Type of Therapy and Topic: Group Therapy: Healthy Vs. Unhealthy Coping Strategies  Participation Level: [facility] PARTICIPATION LEVEL: Active  Description of Group:  In this group, patients will be encouraged to explore their healthy and unhealthy coping strategics. Coping strategies are actions that we take to deal with stress, problems, or uncomfortable emotions in our daily lives. Each patient will be challenged to read some scenarios and discuss the unhealthy and healthy coping strategies within those scenarios. Also, each patient will be challenged to describe current healthy and unhealthy strategies that they use in their own lives and discuss the outcomes and barriers to those strategies. This group will be process-oriented, with patients participating in exploration of their own experiences as well as giving and receiving support and challenge from other group members.  Therapeutic Goals: 1. Patient will identify personal healthy and unhealthy coping strategies. 2. Patient will identify healthy and unhealthy coping strategies, in others, through scenarios.  3. Patient will identify expected outcomes of healthy and unhealthy coping strategies. 4. Patient will identify barriers to using healthy coping strategies.   Summary of Patient Progress:  The patient stated that current healthy supports in her life are separating herself from negative influences while current unhealthy supports include dating the wrong people.  The patient expressed a willingness to add focusing on her career and goals as support(s) to help in her recovery journey.   Therapeutic Modalities:  Cognitive Behavioral Therapy Solution Focused Therapy Motivational Interviewing   Teresita Madura, MSW, [employer] Clinical Social Worker

## 2019-09-29 NOTE — Plan of Care (Signed)
D- Patient alert and oriented. Patient presented in a pleasant mood on assessment reporting that she slept good and had no complaints to voice to this Clinical research associate. Patient denied any signs/symptoms of depression/anxiety, stating "I blew off some steam, so now I'm good, just circumstances". Patient also denied SI, HI, AVH, and pain at this time. Patient's goal for today is "patience, staying focused on going home", in which she will "read, pray", in order to achieve her goal.  A- Scheduled medications administered to patient, per MD orders. Support and encouragement provided.  Routine safety checks conducted every 15 minutes.  Patient informed to notify staff with problems or concerns.  R- No adverse drug reactions noted. Patient contracts for safety at this time. Patient compliant with medications and treatment plan. Patient receptive, calm, and cooperative. Patient interacts well with others on the unit.  Patient remains safe at this time.  Problem: Education: Goal: Knowledge of [place] [facility] Education information/materials will improve Outcome: Progressing Goal: Emotional status will improve Outcome: Progressing Goal: Mental status will improve Outcome: Progressing Goal: Verbalization of understanding the information provided will improve Outcome: Progressing   Problem: Safety: Goal: Periods of time without injury will increase Outcome: Progressing   Problem: Education: Goal: Ability to make informed decisions regarding treatment will improve Outcome: Progressing   Problem: Health Behavior/Discharge Planning: Goal: Identification of resources available to assist in meeting health care needs will improve Outcome: Progressing

## 2019-09-30 DIAGNOSIS — F3163 Bipolar disorder, current episode mixed, severe, without psychotic features: Secondary | ICD-10-CM

## 2019-09-30 MED ORDER — OXCARBAZEPINE 600 MG PO TABS: 600.0000 mg | ORAL_TABLET | Freq: Two times a day (BID) | ORAL | 1 refills | Status: DC

## 2019-09-30 MED ORDER — LORAZEPAM 1 MG PO TABS: 1.0000 mg | ORAL_TABLET | Freq: Two times a day (BID) | ORAL | 1 refills | Status: DC | PRN

## 2019-09-30 MED ORDER — ARIPIPRAZOLE ER 400 MG IM SRER: 400.0000 mg | INTRAMUSCULAR | 2 refills | Status: DC

## 2019-09-30 MED ORDER — HYDROCERIN EX CREA: 1.0000 "application " | TOPICAL_CREAM | Freq: Two times a day (BID) | CUTANEOUS | 0 refills | Status: DC

## 2019-09-30 MED ORDER — OXCARBAZEPINE 600 MG PO TABS: 600.0000 mg | ORAL_TABLET | Freq: Two times a day (BID) | ORAL | 0 refills | Status: DC

## 2019-09-30 NOTE — Progress Notes (Signed)
Patient ID: Kelly Carr, adult   DOB: 07-22-1970, 49 y.o.   MRN: [id number]   Discharge Note:  Patient denies SI/HI/AVH at this time. Discharge instructions, AVS, prescriptions, seven-day supply, and transition record gone over with patient. Patient agrees to comply with medication management, follow-up visit, and outpatient therapy. Patient belongings returned to patient. Patient questions and concerns addressed and answered. Patient ambulatory off unit. Patient discharged to home via [employer], transportation services.

## 2019-09-30 NOTE — Progress Notes (Signed)
D- Patient alert and oriented. Patient presents in a pleasant mood on assessment stating that she slept good last night and had no complaints to voice to this Clinical research associate. Patient denies SI, HI, AVH, and pain at this time. Patient also denies any signs/symptoms of depression/anxiety reporting "I feel great". Patient's goal for today is to "stay focused on a brighter future, building good relationships, going home", in which she will "pray, take meds reaching out to [facility]", in order to achieve her goal.  A- Scheduled medications administered to patient, per MD orders. Support and encouragement provided.  Routine safety checks conducted every 15 minutes.  Patient informed to notify staff with problems or concerns.  R- No adverse drug reactions noted. Patient contracts for safety at this time. Patient compliant with medications and treatment plan. Patient receptive, calm, and cooperative. Patient interacts well with others on the unit.  Patient remains safe at this time.

## 2019-09-30 NOTE — Plan of Care (Signed)
  Problem: Education: Goal: Knowledge of [place] [facility] Education information/materials will improve Outcome: Progressing Goal: Emotional status will improve Outcome: Progressing Goal: Mental status will improve Outcome: Progressing Goal: Verbalization of understanding the information provided will improve Outcome: Progressing   
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
## 2019-09-30 NOTE — B[facility] Group Notes (Signed)
B[facility] Group Notes:  (Nursing/MHT/Case Management/Adjunct)  Date:  09/30/2019  Time:  9:49 AM  Type of Therapy:  Community meeting   Participation Level:  Active  Participation Quality:  Appropriate, Attentive, Sharing and Supportive  Affect:  Appropriate  Cognitive:  Alert and Appropriate  Insight:  Appropriate and Good  Engagement in Group:  Engaged and Supportive  Modes of Intervention:  Discussion, Orientation and Support  Summary of Progress/Problems:  Kelly Carr 09/30/2019, 9:49 AM
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
## 2019-09-30 NOTE — B[facility] Group Notes (Signed)
LCSW Group Therapy Note   09/30/2019 1:00 PM  Type of Therapy and Topic:  Group Therapy:  Overcoming Obstacles   Participation Level:  Active   Description of Group:    In this group patients will be encouraged to explore what they see as obstacles to their own wellness and recovery. They will be guided to discuss their thoughts, feelings, and behaviors related to these obstacles. The group will process together ways to cope with barriers, with attention given to specific choices patients can make. Each patient will be challenged to identify changes they are motivated to make in order to overcome their obstacles. This group will be process-oriented, with patients participating in exploration of their own experiences as well as giving and receiving support and challenge from other group members.   Therapeutic Goals: 1. Patient will identify personal and current obstacles as they relate to admission. 2. Patient will identify barriers that currently interfere with their wellness or overcoming obstacles.  3. Patient will identify feelings, thought process and behaviors related to these barriers. 4. Patient will identify two changes they are willing to make to overcome these obstacles:      Summary of Patient Progress Patient was present in group.  Patient shared how she has been triggered by not taking her medications, lack of consistency and being around her triggers in general.  She was able to identify how she can address these in the future.  Patient was an active and receptive participant in group and group discussions.     Therapeutic Modalities:   Cognitive Behavioral Therapy Solution Focused Therapy Motivational Interviewing Relapse Prevention Therapy  Penni Homans, MSW, LCSW 09/30/2019 12:25 PM

## 2019-09-30 NOTE — Progress Notes (Signed)
  [facility] Adult Case Management Discharge Plan :  Will you be returning to the same living situation after discharge:  Yes,  pt reports that she plans to return home.  At discharge, do you have transportation home?: Yes,  CSW will assist with transportation home. Do you have the ability to pay for your medications: No.  Release of information consent forms completed and in the chart;  Patient's signature needed at discharge.  Patient to Follow up at: Follow-up Information    [facility] Follow up on 10/03/2019.   Why: You are scheduled for a zoom appointment with Unk Pinto, peer support specialist on Thursday, Anyela 29th at 7am. Thank you. Contact information: [address] [place] [address] [phone]           Next level of care provider has access to [facility] Link:no  Safety Planning and Suicide Prevention discussed: Yes,  SPE completed with patient.  Patient declined family/collateral contact.  Have you used any form of tobacco in the last 30 days? (Cigarettes, Smokeless Tobacco, Cigars, and/or Pipes): Yes  Has patient been referred to the Quitline?: Patient refused referral  Patient has been referred for addiction treatment: Pt. refused referral  Harden Mo, LCSW 09/30/2019, 9:25 AM
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
## 2019-10-21 ENCOUNTER — Telehealth: Payer: Self-pay

## 2019-10-21 NOTE — Telephone Encounter (Signed)
No need to see her before paperwork can be done. Only reason would be to know recent hospitalizations and outcomes of those, since her LOV.

## 2019-10-21 NOTE — Telephone Encounter (Signed)
Received paperwork from [facility] Disability for patient, patient is not currently scheduled with an apt. Patient was admitted to the hospital in [place].   Tried to reach patient about filling out paperwork but unable to reach patient or leave a message.    Do you need to see her before we fill out the paperwork?

## 2019-10-21 NOTE — Telephone Encounter (Signed)
Reviewed with wait to hear from patient on scheduling apt.

## 2019-10-31 ENCOUNTER — Ambulatory Visit: Payer: Self-pay | Admitting: Physician Assistant

## 2019-10-31 ENCOUNTER — Ambulatory Visit: Payer: Self-pay

## 2019-11-01 ENCOUNTER — Other Ambulatory Visit: Payer: Self-pay

## 2019-11-01 ENCOUNTER — Ambulatory Visit (INDEPENDENT_AMBULATORY_CARE_PROVIDER_SITE_OTHER): Payer: Self-pay

## 2019-11-01 DIAGNOSIS — F3130 Bipolar disorder, current episode depressed, mild or moderate severity, unspecified: Secondary | ICD-10-CM

## 2019-11-01 NOTE — Progress Notes (Signed)
Pt here for her injection of Abilify Maintena 400 mg, received in left gluteal IM.Tolerated well, no complaints. Patient has been in and out of the hospital since [place] and just recently got out of jail for failure to appear. She seemed to be in good spirits today, has gotten new phone so we should be able to contact her easier. She's in the process of moving. She set up apt with [name] as she left today. Her last injection was on 08/27/2019. Says she has been out of work since March, I did receive paperwork to complete but unable to until she see's [name] at a visit. Patient aware. Patient scheduled next injection for 28 days.  LOT [id number]

## 2019-11-14 ENCOUNTER — Encounter: Payer: Self-pay | Admitting: Physician Assistant

## 2019-11-14 ENCOUNTER — Ambulatory Visit (INDEPENDENT_AMBULATORY_CARE_PROVIDER_SITE_OTHER): Payer: Self-pay | Admitting: Physician Assistant

## 2019-11-14 ENCOUNTER — Other Ambulatory Visit: Payer: Self-pay

## 2019-11-14 DIAGNOSIS — F411 Generalized anxiety disorder: Secondary | ICD-10-CM

## 2019-11-14 DIAGNOSIS — F4310 Post-traumatic stress disorder, unspecified: Secondary | ICD-10-CM

## 2019-11-14 DIAGNOSIS — F3160 Bipolar disorder, current episode mixed, unspecified: Secondary | ICD-10-CM

## 2019-11-14 DIAGNOSIS — G4700 Insomnia, unspecified: Secondary | ICD-10-CM

## 2019-11-14 MED ORDER — HYDROXYZINE HCL 10 MG PO TABS: 10.0000 mg | ORAL_TABLET | Freq: Three times a day (TID) | ORAL | 0 refills | Status: DC | PRN

## 2019-11-14 NOTE — Progress Notes (Signed)
[facility]  Patient ID: Kelly Carr,  MRN: [id number]  PCP: Richarda Osmond, DO  Date of Evaluation: 11/14/2019 Time spent:30 minutes  Chief Complaint:  Chief Complaint    Follow-up      HISTORY/CURRENT STATUS: HPI for medication management.  Kelly Carr has been through a lot since her last visit on 08/29/2019. ODed on Tylenol on 09/23/2019.  "I felt so low and empty, I've lost my family, my kids won't speak to me, I talk to them through my brother."  She was in the behavioral health for about a week.  She still having a lot of stress because of the circumstances but states she is thankful guide prevented the suicide attempt from being successful.  She now feels more hope.  She is taking a break from school but is trying to take care of herself, "not everybody else for a change."  She was started on Trileptal but it made her too drowsy so she stopped it.  She was in jail 2 1/2 weeks, in May. [facility], for failure to appear for [facility], after the incident in [place].  See previous records.  "That gave me a new appreciation of having my long time and privacy.  I never want to go through that ever again."  Is moving to a different apt to be away from her boyfriend's mother.   Energy and motivation are good.  Appetite is normal.  She did lose some weight when she was in jail.  Not on purpose though.  She denies suicidal or homicidal thoughts now.  She still has a lot of anxiety.  She was given Ativan, she states after having left the hospital which has helped with the anxiety.  She has not taken it frequently.  Patient denies increased energy with decreased need for sleep, no increased talkativeness, no racing thoughts, no impulsivity or risky behaviors, no increased spending, no increased libido, no grandiosity.  No increased irritability, no hallucinations.  Denies dizziness, syncope, seizures, numbness, tingling, tremor, tics, unsteady gait, slurred speech,  confusion. Denies muscle or joint pain, stiffness, or dystonia.  Individual Medical History/ Review of Systems: Changes? :Yes see above  Past medications for mental health diagnoses include: Gabapentin, Ativan, BuSpar, Wellbutrin, Latuda, trazodone, Cogentin, Geodon, Trileptal, Vraylar, prazosin, Vistaril, Abilify maintainena,Abilify  Allergies: Haldol [haloperidol lactate], Risperidone and related, Tramadol, and Valproic acid  Current Medications:  Current Outpatient Medications:  .  ARIPiprazole ER (ABILIFY MAINTENA) 400 MG SRER injection, Inject 2 mLs (400 mg total) into the muscle every 28 (twenty-eight) days., Disp: 1 each, Rfl: 2 .  hydrocerin (EUCERIN) CREA, Apply 1 application topically 2 (two) times daily., Disp: 113 g, Rfl: 0 .  LORazepam (ATIVAN) 1 MG tablet, Take 1 tablet (1 mg total) by mouth 2 (two) times daily as needed for anxiety. (Patient taking differently: Take 0.5-1 mg by mouth 2 (two) times daily as needed for anxiety. ), Disp: 30 tablet, Rfl: 1 .  hydrOXYzine (ATARAX/VISTARIL) 10 MG tablet, Take 1 tablet (10 mg total) by mouth 3 (three) times daily as needed., Disp: 30 tablet, Rfl: 0 .  oxcarbazepine (TRILEPTAL) 600 MG tablet, Take 1 tablet (600 mg total) by mouth 2 (two) times daily. (Patient not taking: Reported on 11/14/2019), Disp: 60 tablet, Rfl: 1 Medication Side Effects: none  Family Medical/ Social History: Changes? Yes see HPI  MENTAL HEALTH EXAM:  There were no vitals taken for this visit.There is no height or weight on file to calculate BMI.  General Appearance: Casual,  Neat and Well Groomed  Eye Contact:  Good  Speech:  Clear and Coherent and Normal Rate  Volume:  Normal  Mood:  Euthymic  Affect:  Appropriate  Thought Process:  Goal Directed and Descriptions of Associations: Intact  Orientation:  Full (Time, Place, and Person)  Thought Content: Logical   Suicidal Thoughts:  No  Homicidal Thoughts:  No  Memory:  WNL  Judgement:  Good  Insight:   Good  Psychomotor Activity:  Normal  Concentration:  Concentration: Good  Recall:  Good  Fund of Knowledge: Good  Language: Good  Assets:  Desire for Improvement  ADL's:  Intact  Cognition: WNL  Prognosis:  Good    DIAGNOSES:    ICD-10-CM   1. Mixed bipolar I disorder (HCC)  F31.60   2. Generalized anxiety disorder  F41.1   3. PTSD (post-traumatic stress disorder)  F43.10   4. Insomnia, unspecified type  G47.00     Receiving Psychotherapy: Yes  group therapy    RECOMMENDATIONS:  PDMP was reviewed.  I do not want to give her any Ativan because of the suicide attempt.  I will prescribe hydroxyzine. Continue Abilify Maintena 400 mg monthly. Start hydroxyzine 10 mg, 1 p.o. 3 times daily as needed. Continue therapy. Goes back to work Friday 11/22/2019 Return in 6 weeks.  Melony Overly, PA-C

## 2019-11-20 NOTE — Addendum Note (Signed)
Addended by: Melony Overly T on: 11/20/2019 05:05 PM   Modules accepted: Level of Service

## 2019-11-27 ENCOUNTER — Other Ambulatory Visit: Payer: Self-pay

## 2019-11-27 ENCOUNTER — Ambulatory Visit (INDEPENDENT_AMBULATORY_CARE_PROVIDER_SITE_OTHER): Payer: Self-pay

## 2019-11-27 DIAGNOSIS — F3160 Bipolar disorder, current episode mixed, unspecified: Secondary | ICD-10-CM

## 2019-11-27 DIAGNOSIS — F319 Bipolar disorder, unspecified: Secondary | ICD-10-CM

## 2019-11-27 NOTE — Progress Notes (Signed)
Pt here for her injection of Abilify Maintena 400 mg, received inleftgluteal IM.Tolerated well, no complaints. She's back to work and it's going well. Her last injection was on 11/01/2019. Patient needs to be scheduled in 28 days week of July 19th.   LOT [id number]  [facility] 2023

## 2019-12-26 ENCOUNTER — Ambulatory Visit (INDEPENDENT_AMBULATORY_CARE_PROVIDER_SITE_OTHER): Payer: Self-pay

## 2019-12-26 ENCOUNTER — Other Ambulatory Visit: Payer: Self-pay

## 2019-12-26 ENCOUNTER — Ambulatory Visit (INDEPENDENT_AMBULATORY_CARE_PROVIDER_SITE_OTHER): Payer: Self-pay | Admitting: Physician Assistant

## 2019-12-26 ENCOUNTER — Encounter: Payer: Self-pay | Admitting: Physician Assistant

## 2019-12-26 DIAGNOSIS — R5383 Other fatigue: Secondary | ICD-10-CM

## 2019-12-26 DIAGNOSIS — F3162 Bipolar disorder, current episode mixed, moderate: Secondary | ICD-10-CM

## 2019-12-26 DIAGNOSIS — F319 Bipolar disorder, unspecified: Secondary | ICD-10-CM

## 2019-12-26 NOTE — Progress Notes (Signed)
[facility]  Patient ID: Kelly Carr,  MRN: [id number]  PCP: Kelly Bock, DO  Date of Evaluation: 12/26/2019 Time spent:20 minutes  Chief Complaint:  Chief Complaint    Follow-up      HISTORY/CURRENT STATUS: HPI for medication management.  States her mood has been very stable since the last visit.  She is able to enjoy things but her energy is really low.  For the past month or so she has been extremely tired.  She wants to take a nap during the day and feels like she does not have the energy to do things like she used to.  She is sleeping well.  Still does not feel rested when she gets up though.  She still has menses but no heavier than normal.  Motivation is good when she can get up the energy to do things. She is not isolating.  Not crying easily.  Denies suicidal or homicidal thoughts.  She is still working at [employer], and is working about 35 hours/week now.   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    Kelly Carr is not having as much anxiety as she did.  She never got the hydroxyzine filled because she is already so tired she did not want anything to add to it.  She has not needed the Ativan in a while.  "I feel like I am doing really good with that and just in general."  Patient denies increased energy with decreased need for sleep, no increased talkativeness, no racing thoughts, no impulsivity or risky behaviors, no increased spending, no increased libido, no grandiosity.  No increased irritability, no hallucinations, no paranoia.   She has had no legal trouble since her last visit.  She asked for a note to take to court tomorrow where she has a hearing concern in the misdemeanor offense, see past notes, when she left the scene after discussion with Hydrographic surveyor.  Request and no stating that she is under my care for the bipolar disorder, just so the attorney and judge understands she is getting psychiatric  care.  Denies dizziness, syncope, seizures, numbness, tingling, tremor, tics, unsteady gait, slurred speech, confusion. Denies muscle or joint pain, stiffness, or dystonia.  Individual Medical History/ Review of Systems: Changes? :Yes see above  Past medications for mental health diagnoses include: Gabapentin, Ativan, BuSpar, Wellbutrin, Latuda, trazodone, Cogentin, Geodon, Trileptal, Vraylar, prazosin, Vistaril, Abilify maintainena,Abilify  Allergies: Haldol [haloperidol lactate], Risperidone and related, Tramadol, and Valproic acid  Current Medications:  Current Outpatient Medications:  .  ARIPiprazole ER (ABILIFY MAINTENA) 400 MG SRER injection, Inject 2 mLs (400 mg total) into the muscle every 28 (twenty-eight) days., Disp: 1 each, Rfl: 2 .  hydrocerin (EUCERIN) CREA, Apply 1 application topically 2 (two) times daily., Disp: 113 g, Rfl: 0 .  hydrOXYzine (ATARAX/VISTARIL) 10 MG tablet, Take 1 tablet (10 mg total) by mouth 3 (three) times daily as needed. (Patient not taking: Reported on 12/26/2019), Disp: 30 tablet, Rfl: 0 .  LORazepam (ATIVAN) 1 MG tablet, Take 1 tablet (1 mg total) by mouth 2 (two) times daily as needed for anxiety. (Patient not taking: Reported on 12/26/2019), Disp: 30 tablet, Rfl: 1 Medication Side Effects: none  Family Medical/ Social History: Changes? Yes see HPI  MENTAL HEALTH EXAM:  There were no vitals taken for this visit.There is no height or weight on file to calculate BMI.  General Appearance: Casual, Neat and Well Groomed  Eye Contact:  Good  Speech:  Clear and Coherent and Normal Rate  Volume:  Normal  Mood:  Euthymic  Affect:  Appropriate  Thought Process:  Goal Directed and Descriptions of Associations: Intact  Orientation:  Full (Time, Place, and Person)  Thought Content: Logical   Suicidal Thoughts:  No  Homicidal Thoughts:  No  Memory:  WNL  Judgement:  Good  Insight:  Good  Psychomotor Activity:  Normal  Concentration:  Concentration:  Good  Recall:  Good  Fund of Knowledge: Good  Language: Good  Assets:  Desire for Improvement  ADL's:  Intact  Cognition: WNL  Prognosis:  Good   Labs from [place] 2021 show mildly decreased hemoglobin and hematocrit.  DIAGNOSES:    ICD-10-CM   1. Bipolar I disorder (HCC)  F31.9   2. Fatigue, unspecified type  R53.83     Receiving Psychotherapy: Yes  group therapy    RECOMMENDATIONS:  PDMP was reviewed.  We discussed the fatigue.  I do not think it is related to depression and she is not having other symptoms of that.  3 months ago, she was mildly anemic, I recommend that she contact her PCP to discuss the fatigue and have labs drawn if appropriate.  Kelly Carr will call and make an appointment. Continue Abilify Maintena 400 mg monthly.  Injection was given today per Kelly Ober, LPN. Continue therapy. Note on prescription paper was handwritten and given to her, stating "to whom it may concern: [name] is under my care for bipolar disorder.  She is doing well and is stable on Abilify monthly injections.  Has been stable for at least 4 months.  Sincerely Melony Overly, PA-C." Return in 2 months.  Melony Overly, PA-C

## 2019-12-27 NOTE — Progress Notes (Signed)
Pt here for herinjection ofAbilify Maintena 400 mg, received inleftglutealIM.Tolerated well, no complaints. Patient complaining of feeling fatigued and tired for awhile. Patient scheduled with [name] after injection, advised her to discuss with her. Her last injection was on 11/27/2019. Patient needs to be scheduled in 28 days week of August 19th. LOT [id number]  EXPJUL 2022 LOT [id number]

## 2020-01-22 ENCOUNTER — Ambulatory Visit (INDEPENDENT_AMBULATORY_CARE_PROVIDER_SITE_OTHER): Payer: Self-pay

## 2020-01-22 ENCOUNTER — Other Ambulatory Visit: Payer: Self-pay

## 2020-01-22 DIAGNOSIS — F3162 Bipolar disorder, current episode mixed, moderate: Secondary | ICD-10-CM

## 2020-01-22 DIAGNOSIS — F411 Generalized anxiety disorder: Secondary | ICD-10-CM

## 2020-01-22 MED ORDER — HYDROXYZINE HCL 10 MG PO TABS: 10.0000 mg | ORAL_TABLET | Freq: Three times a day (TID) | ORAL | 0 refills | Status: DC | PRN

## 2020-01-22 NOTE — Progress Notes (Signed)
Pt here for herinjection ofAbilify Maintena 400 mg, received inrightglutealIM.Tolerated well, no complaints. Patient had complained last month about feeling tired and fatigued, [name] advised patient to follow up with her PCP since her Fe level was low at her last hospitalization. Patient did start taking vitamins and hopes that will help. We discussed her recent anxiety and she feels she may go ahead and get the Rx for hydroxyzine that [name] had previously submitted to her pharmacy in June, said she's having increased anxiety and agrees to try. Sent Rx again to her pharmacy since it's never been filled to make sure they received it. Advised to call back if anxiety worsens or has any side effects with medication. Patientneeds to bescheduledin28 days week of September 15th.   EXPJUL 2023 LOT [id number]

## 2020-02-14 ENCOUNTER — Other Ambulatory Visit: Payer: Self-pay

## 2020-02-14 ENCOUNTER — Emergency Department
Admission: EM | Admit: 2020-02-14 | Discharge: 2020-02-14 | Disposition: A | Discharge status: Home / Self Care | Payer: Self-pay | Attending: Emergency Medicine | Admitting: Emergency Medicine

## 2020-02-14 ENCOUNTER — Encounter: Payer: Self-pay | Admitting: Emergency Medicine

## 2020-02-14 DIAGNOSIS — Z5321 Procedure and treatment not carried out due to patient leaving prior to being seen by health care provider: Secondary | ICD-10-CM | POA: Insufficient documentation

## 2020-02-14 DIAGNOSIS — R55 Syncope and collapse: Secondary | ICD-10-CM | POA: Insufficient documentation

## 2020-02-14 LAB — URINALYSIS, COMPLETE (UACMP) WITH MICROSCOPIC
Bacteria, UA: NONE SEEN
Bilirubin Urine: NEGATIVE
Glucose, UA: NEGATIVE mg/dL
Hgb urine dipstick: NEGATIVE
Ketones, ur: NEGATIVE mg/dL
Leukocytes,Ua: NEGATIVE
Nitrite: NEGATIVE
Protein, ur: NEGATIVE mg/dL
Specific Gravity, Urine: 1.01 (ref 1.005–1.030)
pH: 7 (ref 5.0–8.0)

## 2020-02-14 LAB — CBC
HCT: 37.1 % (ref 36.0–46.0)
Hemoglobin: 12.3 g/dL (ref 12.0–15.0)
MCH: 29.1 pg (ref 26.0–34.0)
MCHC: 33.2 g/dL (ref 30.0–36.0)
MCV: 87.9 fL (ref 80.0–100.0)
Platelets: 252 10*3/uL (ref 150–400)
RBC: 4.22 MIL/uL (ref 3.87–5.11)
RDW: 13.5 % (ref 11.5–15.5)
WBC: 9 10*3/uL (ref 4.0–10.5)
nRBC: 0 % (ref 0.0–0.2)

## 2020-02-14 LAB — BASIC METABOLIC PANEL
Anion gap: 7 (ref 5–15)
BUN: 10 mg/dL (ref 6–20)
CO2: 28 mmol/L (ref 22–32)
Calcium: 8.9 mg/dL (ref 8.9–10.3)
Chloride: 102 mmol/L (ref 98–111)
Creatinine, Ser: 0.58 mg/dL (ref 0.44–1.00)
GFR calc Af Amer: >60 mL/min (ref >60)
GFR calc non Af Amer: >60 mL/min (ref >60)
Glucose, Bld: 95 mg/dL (ref 70–99)
Potassium: 3.9 mmol/L (ref 3.5–5.1)
Sodium: 137 mmol/L (ref 135–145)

## 2020-02-14 LAB — POCT PREGNANCY, URINE: Preg Test, Ur: NEGATIVE

## 2020-02-14 NOTE — ED Notes (Signed)
No answer when called 

## 2020-02-14 NOTE — ED Triage Notes (Signed)
First nurse note- pt here for intermittent syncope like episodes over past month or two. Sent today from urgent care. Has not had evaluated yet.

## 2020-02-14 NOTE — ED Notes (Signed)
No answer for room 

## 2020-02-14 NOTE — ED Notes (Signed)
No answer

## 2020-02-14 NOTE — ED Triage Notes (Addendum)
Pt here for near syncope type episodes about once a week for two months.  Pt ambulatory. Gets headache when sx onset.  Will feel like things go black and gets nauseated but does not actually pass out.  Unlabored. No fever. Has missed her last period, unsure if pregnant.

## 2020-02-19 ENCOUNTER — Other Ambulatory Visit: Payer: Self-pay

## 2020-02-19 ENCOUNTER — Ambulatory Visit (INDEPENDENT_AMBULATORY_CARE_PROVIDER_SITE_OTHER): Payer: Self-pay | Admitting: Family Medicine

## 2020-02-19 VITALS — BP 90/50 | HR 76 | Wt 158.0 lb

## 2020-02-19 DIAGNOSIS — R55 Syncope and collapse: Secondary | ICD-10-CM

## 2020-02-19 NOTE — Patient Instructions (Addendum)
Syncope work-up: Overall, I am most suspicious of these episodes being related to anxiety or your heart.  It is also possible this is related to your Abilify.  For now, we are going to take a look at your thyroid level and I am going to refer you to cardiology.  You should get a call in the next week or 2 to set up an appointment with cardiology.  The cardiologist may be interested in doing a long-term event monitor or getting an echocardiogram.  I will let them decide.  I recommend he also discussed this with your psychiatrist as it is possible that your symptoms can be related to your Abilify injections.  If we do not find another cause of these episodes I would be tempted to blame the Abilify.  I encourage you to stay well-hydrated and try to look for patterns if these episodes continue to occur.

## 2020-02-19 NOTE — Progress Notes (Signed)
    SUBJECTIVE:   CHIEF COMPLAINT / HPI:   Presyncope/syncope Kelly Carr reports that she has been having episodes of seeing black and passing out for the past 6 weeks.  This is happened 4 times in the past 6 weeks.  During a typical episode, she is usually moving forward working, exerting herself in a minor way and she will feel her heart start racing.  Her vision will turn black and she gets very weak.  On 3 occasions, she has been able to set herself down on the floor or find a seat until she recovers.  On one occasion, she did pass out and wake up on the floor.  She denies tongue biting, urinary incontinence, fecal incontinence, chest pain.  She notes that she is very tired after these events but is able to remember them for the most part.  She does have a history of anxiety with panic attacks all the this feels distinctly different from her panic attacks per her report.  She has no previous medical history of neurologic or cardiac issues.  Her current psychiatric medicine includes Abilify Depo injections.  She has been on the same Abilify dose for years without any recent changes.  She has not experienced any significant injury from this.  She has been taking a lot of precautions regarding driving and has been driving as little as possible.  PERTINENT  PMH / PSH: Bipolar 1 disorder, anxiety with panic attacks  OBJECTIVE:   BP (!) 90/50   Pulse 76   Wt 158 lb (71.7 kg)   BMI 27.12 kg/m    Orthostatic vitals: Lying down: BP: 92/46; HR 77 Seated: BP: 90/60; HR 65 Standing: BP: 92/78; HR 64  General: Alert and cooperative and appears to be in no acute distress HEENT: Neck non-tender without lymphadenopathy, masses or thyromegaly Cardio: Normal S1 and S2, no S3 or S4. Rhythm is regular. No murmurs or rubs.   Pulm: Clear to auscultation bilaterally, no crackles, wheezing, or diminished breath sounds. Normal respiratory effort  Extremities: No peripheral edema. Warm/ well perfused.   Strong radial pulse. Neuro: Cranial nerves grossly intact  ASSESSMENT/PLAN:   Syncope Unclear etiology at this time.  The differential includes anxiety, cardiac cause, medication adverse effects, dehydration.  Her history is not concerning for neurological causes of syncope, very low suspicion for epilepsy.  Low suspicion for vasovagal syncope.  Previous work-up in the ED showed an EKG notable only for mild bradycardia, CMP and CBC were normal at that time.  Echocardiogram from 2 years ago showed no abnormalities.  She has no history of cardiac or neurological issues.  No evidence of orthostatic hypotension in the clinic today although does appear that her blood pressure is chronically low with systolics in the 90s.  She was informed that this is most likely related to anxiety, her heart for medication side effect.  She is encouraged to stay well-hydrated and to avoid driving while we pursue work-up.  She has a follow-up appointment with her psychiatrist tomorrow and was encouraged to discuss her present work-up for syncope because we suspect Abilify may be contributing. -Stay hydrated -Follow-up TSH -Ambulatory referral to cardiology placed     Kelly Mo, MD [facility]

## 2020-02-19 NOTE — Assessment & Plan Note (Signed)
Unclear etiology at this time.  The differential includes anxiety, cardiac cause, medication adverse effects, dehydration.  Her history is not concerning for neurological causes of syncope, very low suspicion for epilepsy.  Low suspicion for vasovagal syncope.  Previous work-up in the ED showed an EKG notable only for mild bradycardia, CMP and CBC were normal at that time.  Echocardiogram from 2 years ago showed no abnormalities.  She has no history of cardiac or neurological issues.  No evidence of orthostatic hypotension in the clinic today although does appear that her blood pressure is chronically low with systolics in the 90s.  She was informed that this is most likely related to anxiety, her heart for medication side effect.  She is encouraged to stay well-hydrated and to avoid driving while we pursue work-up.  She has a follow-up appointment with her psychiatrist tomorrow and was encouraged to discuss her present work-up for syncope because we suspect Abilify may be contributing. -Stay hydrated -Follow-up TSH -Ambulatory referral to cardiology placed

## 2020-02-20 ENCOUNTER — Telehealth: Payer: Self-pay | Admitting: Physician Assistant

## 2020-02-20 ENCOUNTER — Telehealth (INDEPENDENT_AMBULATORY_CARE_PROVIDER_SITE_OTHER): Payer: Self-pay | Admitting: Physician Assistant

## 2020-02-20 ENCOUNTER — Encounter: Payer: Self-pay | Admitting: Physician Assistant

## 2020-02-20 DIAGNOSIS — R55 Syncope and collapse: Secondary | ICD-10-CM

## 2020-02-20 DIAGNOSIS — F411 Generalized anxiety disorder: Secondary | ICD-10-CM

## 2020-02-20 DIAGNOSIS — F3162 Bipolar disorder, current episode mixed, moderate: Secondary | ICD-10-CM

## 2020-02-20 LAB — TSH: TSH: 1.21 u[IU]/mL (ref 0.450–4.500)

## 2020-02-20 NOTE — Telephone Encounter (Signed)
Ms. caysie, minnifield are scheduled for a virtual visit with your provider today.    Just as we do with appointments in the office, we must obtain your consent to participate.  Your consent will be active for this visit and any virtual visit you may have with one of our providers in the next 365 days.    If you have a MyChart account, I can also send a copy of this consent to you electronically.  All virtual visits are billed to your insurance company just like a traditional visit in the office.  As this is a virtual visit, video technology does not allow for your provider to perform a traditional examination.  This may limit your provider's ability to fully assess your condition.  If your provider identifies any concerns that need to be evaluated in person or the need to arrange testing such as labs, EKG, etc, we will make arrangements to do so.    Although advances in technology are sophisticated, we cannot ensure that it will always work on either your end or our end.  If the connection with a video visit is poor, we may have to switch to a telephone visit.  With either a video or telephone visit, we are not always able to ensure that we have a secure connection.   I need to obtain your verbal consent now.   Are you willing to proceed with your visit today?   Nilani Choung has provided verbal consent on 02/20/2020 for a virtual visit (video or telephone).   Melony Overly, PA-C 02/20/2020  10:00 AM

## 2020-02-20 NOTE — Progress Notes (Signed)
[facility] Med Check  Patient ID: Kelly Carr,  MRN: [id number]  PCP: Leeroy Bock, DO  Date of Evaluation: 02/20/2020 Time spent:20 minutes  Chief Complaint:  Chief Complaint    Follow-up     Virtual Visit via Telehealth  I connected with patient by a video enabled telemedicine application with their informed consent, and verified patient privacy and that I am speaking with the correct person using two identifiers.  I am private, in my office and the patient is at home.  I discussed the limitations, risks, security and privacy concerns of performing an evaluation and management service by video and the availability of in person appointments. I also discussed with the patient that there may be a patient responsible charge related to this service. The patient expressed understanding and agreed to proceed.   I discussed the assessment and treatment plan with the patient. The patient was provided an opportunity to ask questions and all were answered. The patient agreed with the plan and demonstrated an understanding of the instructions.   The patient was advised to call back or seek an in-person evaluation if the symptoms worsen or if the condition fails to improve as anticipated.  I provided 20 minutes of non-face-to-face time during this encounter.  HISTORY/CURRENT STATUS: HPI for routine med check.  Kelly Carr is seen via video today because she is unable to drive.  She has been having "blackout spells" for the past several weeks.  She saw her PCP yesterday and was told not to drive.  These feelings of dizziness and blackouts come with no warning at all.  She has been able to work still at her job at [employer], but has missed 1 or 2 days because of these episodes.  Is being referred to cardiology for work-up.  She feels like the anxiety is well controlled.  She does use the hydroxyzine when needed but has not needed it very often at all.  Her PCP did ask about anxiety, stating  she may need to get on something to prevent it, if that is what is causing the episode she is having.  Mayrani reports stable mood.  She is due for her monthly Abilify maintaina within the next few days. She is able to enjoy things.  Her energy and motivation are at her baseline.  She is not isolating.  Not crying easily.  Hygiene is normal.  Memory and concentration are good.  Sleeps well most of the time.  No suicidal or homicidal thoughts.   Patient denies increased energy with decreased need for sleep, no increased talkativeness, no racing thoughts, no impulsivity or risky behaviors, no increased spending, no increased libido, no grandiosity.  No increased irritability, no hallucinations, no paranoia.   She does not bring up any of the legal issues that she has had in the past.  See previous notes.  Denies seizures, numbness, tingling, tremor, tics, unsteady gait, slurred speech, confusion. Denies muscle or joint pain, stiffness, or dystonia.  Individual Medical History/ Review of Systems: Changes? :Yes  has dizziness and Saw Dr yesterday, will be getting cardiac work-up  Past medications for mental health diagnoses include: Gabapentin caused lethargy, Ativan, BuSpar, Wellbutrin, Latuda, trazodone, Cogentin, Geodon, Trileptal, Vraylar, prazosin, Vistaril, Abilify maintainena,Abilify  Allergies: Haldol [haloperidol lactate], Risperidone and related, Tramadol, and Valproic acid  Current Medications:  Current Outpatient Medications:  .  ARIPiprazole ER (ABILIFY MAINTENA) 400 MG SRER injection, Inject 2 mLs (400 mg total) into the muscle every 28 (twenty-eight) days., Disp: 1 each, Rfl:  2 .  hydrOXYzine (ATARAX/VISTARIL) 10 MG tablet, Take 1 tablet (10 mg total) by mouth 3 (three) times daily as needed., Disp: 30 tablet, Rfl: 0 .  hydrocerin (EUCERIN) CREA, Apply 1 application topically 2 (two) times daily., Disp: 113 g, Rfl: 0 Medication Side Effects: none  Family Medical/ Social History:  Changes? Yes see HPI  MENTAL HEALTH EXAM:  There were no vitals taken for this visit.There is no height or weight on file to calculate BMI.  General Appearance: Casual, Neat and Well Groomed  Eye Contact:  Good  Speech:  Clear and Coherent and Normal Rate  Volume:  Normal  Mood:  Euthymic  Affect:  Appropriate  Thought Process:  Goal Directed and Descriptions of Associations: Intact  Orientation:  Full (Time, Place, and Person)  Thought Content: Logical   Suicidal Thoughts:  No  Homicidal Thoughts:  No  Memory:  WNL  Judgement:  Good  Insight:  Good  Psychomotor Activity:  Normal  Concentration:  Concentration: Good  Recall:  Good  Fund of Knowledge: Good  Language: Good  Assets:  Desire for Improvement  ADL's:  Intact  Cognition: WNL  Prognosis:  Good    DIAGNOSES:    ICD-10-CM   1. Bipolar 1 disorder, mixed, moderate (HCC)  F31.62   2. Generalized anxiety disorder  F41.1   3. Syncope, unspecified syncope type  R55     Receiving Psychotherapy: Yes  group therapy    RECOMMENDATIONS:  PDMP was reviewed.  I provided 20 minutes of nonface-to-face time during this encounter. She will let me know if the anxiety worsens.  She knows what it feels like to have severe anxiety whether it is generalized or panic attacks, so she will watch for those symptoms.  Right now she is not having any problems and when she does have mild anxiety the hydroxyzine takes care of it. Continue Abilify maintain a 400 mg IM q. 28 days.  She is due for an injection and will call today and make an appointment within the next week. Continue hydroxyzine 10 mg, 1 p.o. 3 times daily as needed. Continue therapy. Return in approximately 5 weeks.  Hopefully she can schedule the Abilify injection and the appointment with me at the same time.  Melony Overly, PA-C

## 2020-02-26 ENCOUNTER — Ambulatory Visit: Payer: Self-pay

## 2020-02-27 ENCOUNTER — Ambulatory Visit: Payer: Self-pay

## 2020-02-28 ENCOUNTER — Other Ambulatory Visit: Payer: Self-pay

## 2020-02-28 ENCOUNTER — Ambulatory Visit (INDEPENDENT_AMBULATORY_CARE_PROVIDER_SITE_OTHER): Payer: Self-pay

## 2020-02-28 DIAGNOSIS — F3162 Bipolar disorder, current episode mixed, moderate: Secondary | ICD-10-CM

## 2020-02-28 NOTE — Progress Notes (Signed)
Pt here for herinjection ofAbilify Maintena 400 mg, received inrightglutealIM.Tolerated well, no complaints.Patient states having no issues and instructed to schedule next injection in 28 days.   LOT Ais152oc EXP JUL 2023

## 2020-03-03 ENCOUNTER — Telehealth: Payer: Self-pay | Admitting: *Deleted

## 2020-03-03 MED ORDER — MEGESTROL ACETATE 40 MG PO TABS: 40.0000 mg | ORAL_TABLET | Freq: Two times a day (BID) | ORAL | 1 refills | Status: DC

## 2020-03-03 NOTE — Telephone Encounter (Signed)
Had Dr Macon Large review message from patient. Per Dr Macon Large, we can send in megace for the bleeding. Pt had abnormal pap in 2019 and does need to follow up and can referred to [facility] due to her not having insurance to be further evaluated. Pt verbalizes and understands and is willing to be seen at [facility]. Will send in megace to [facility].

## 2020-03-03 NOTE — Telephone Encounter (Signed)
-----   Message from Tereso Newcomer, MD sent at 03/03/2020  1:36 PM EDT ----- Regarding: RE: AUB She needs evaluation.  Had abnormal pap in 2019 without any follow up.  Can be referred to [facility] given insurance status.    As for current bleeding, can prescribe Megace 40 daily, can increase to 80 daily or more if needed to control bleeding.  But emphasize the concern given abnormal pap smear, also will need ultrasound and endometrial biopsy.    Vela Prose ----- Message ----- From: Clovis Riley, RN Sent: 03/03/2020   1:26 PM EDT To: Tereso Newcomer, MD Subject: FW: AUB                                        You last saw her in 2019, please help advise : )  ----- Message ----- From: Rozann Lesches, NT Sent: 03/03/2020   9:57 AM EDT To: Clovis Riley, RN Subject: AUB                                            Patient called concerned about length of time she has had her cycle this month, started on 02/19/20 and still continuing. Not extremely heavy but having to wear pads and change. Does not have insurance and wanted to know if there was something that she can do

## 2020-03-03 NOTE — Telephone Encounter (Signed)
-----   Message from Rozann Lesches, NT sent at 03/03/2020  9:57 AM EDT ----- Regarding: AUB Patient called concerned about length of time she has had her cycle this month, started on 02/19/20 and still continuing. Not extremely heavy but having to wear pads and change. Does not have insurance and wanted to know if there was something that she can do

## 2020-03-03 NOTE — Telephone Encounter (Signed)
Erroneous

## 2020-03-04 ENCOUNTER — Telehealth: Payer: Self-pay

## 2020-03-04 NOTE — Telephone Encounter (Signed)
Left message requesting return call regarding referral to [facility] per [place] [facility].

## 2020-03-11 ENCOUNTER — Encounter: Payer: Self-pay | Admitting: *Deleted

## 2020-03-11 ENCOUNTER — Ambulatory Visit (INDEPENDENT_AMBULATORY_CARE_PROVIDER_SITE_OTHER): Payer: Self-pay | Admitting: Physician Assistant

## 2020-03-11 ENCOUNTER — Encounter: Payer: Self-pay | Admitting: Physician Assistant

## 2020-03-11 ENCOUNTER — Other Ambulatory Visit: Payer: Self-pay

## 2020-03-11 ENCOUNTER — Telehealth: Payer: Self-pay | Admitting: Radiology

## 2020-03-11 VITALS — BP 112/66 | HR 83 | Ht 64.0 in | Wt 159.0 lb

## 2020-03-11 DIAGNOSIS — R55 Syncope and collapse: Secondary | ICD-10-CM

## 2020-03-11 DIAGNOSIS — F319 Bipolar disorder, unspecified: Secondary | ICD-10-CM

## 2020-03-11 NOTE — Patient Instructions (Addendum)
Medication Instructions:  Your physician recommends that you continue on your current medications as directed. Please refer to the Current Medication list given to you today.   *If you need a refill on your cardiac medications before your next appointment, please call your pharmacy*   Lab Work: None ordered  If you have labs (blood work) drawn today and your tests are completely normal, you will receive your results only by: Marland Kitchen MyChart Message (if you have MyChart) OR . A paper copy in the mail If you have any lab test that is abnormal or we need to change your treatment, we will call you to review the results.   Testing/Procedures: Your physician has recommended that you wear an event monitor.  This will be mailed to your home.  Event monitors are medical devices that record the heart's electrical activity. Doctors most often [place] these monitors to diagnose arrhythmias. Arrhythmias are problems with the speed or rhythm of the heartbeat. The monitor is a small, portable device. You can wear one while you do your normal daily activities. This is usually used to diagnose what is causing palpitations/syncope (passing out).  .    Follow-Up: At [facility], you and your health needs are our priority.  As part of our continuing mission to provide you with exceptional heart care, we have created designated Provider Care Teams.  These Care Teams include your primary Cardiologist (physician) and Advanced Practice Providers (APPs -  Physician Assistants and Nurse Practitioners) who all work together to provide you with the care you need, when you need it.  We recommend signing up for the patient portal called "MyChart".  Sign up information is provided on this After Visit Summary.  MyChart is used to connect with patients for Virtual Visits (Telemedicine).  Patients are able to view lab/test results, encounter notes, upcoming appointments, etc.  Non-urgent messages can be sent to your provider as  well.   To learn more about what you can do with MyChart, go to [email or web address].    Your next appointment:   AS NEEDED   The format for your next appointment:     Provider:      Other Instructions  [employer] Cardiac Event Monitor Instructions Your physician has requested you wear your cardiac event monitor for _____ days, (1-30). [employer] may call or text to confirm a shipping address. The monitor will be sent to a land address via UPS. [employer] will not ship a monitor to a PO BOX. It typically takes 3-5 days to receive your monitor after it has been enrolled. [employer] will assist with USPS tracking if your package is delayed. The telephone number for [employer] is [phone]. Once you have received your monitor, please review the enclosed instructions. Instruction tutorials can also be viewed under help and settings on the enclosed cell phone. Your monitor has already been registered assigning a specific monitor serial # to you.  Applying the monitor Remove cell phone from case and turn it on. The cell phone works as IT consultant and needs to be within [employer] of you at all times. The cell phone will need to be charged on a daily basis. We recommend you plug the cell phone into the enclosed charger at your bedside table every night.  Monitor batteries: You will receive two monitor batteries labelled #1 and #2. These are your recorders. Plug battery #2 onto the second connection on the enclosed charger. Keep one battery on the charger at all times. This will keep the monitor  battery deactivated. It will also keep it fully charged for when you need to switch your monitor batteries. A small light will be blinking on the battery emblem when it is charging. The light on the battery emblem will remain on when the battery is fully charged.  Open package of a Monitor strip. Insert battery #1 into black hood on strip and gently squeeze monitor battery onto  connection as indicated in instruction booklet. Set aside while preparing skin.  Choose location for your strip, vertical or horizontal, as indicated in the instruction booklet. Shave to remove all hair from location. There cannot be any lotions, oils, powders, or colognes on skin where monitor is to be applied. Wipe skin clean with enclosed Saline wipe. Dry skin completely.  Peel paper labeled #1 off the back of the Monitor strip exposing the adhesive. Place the monitor on the chest in the vertical or horizontal position shown in the instruction booklet. One arrow on the monitor strip must be pointing upward. Carefully remove paper labeled #2, attaching remainder of strip to your skin. Try not to create any folds or wrinkles in the strip as you apply it.  Firmly press and release the circle in the center of the monitor battery. You will hear a small beep. This is turning the monitor battery on. The heart emblem on the monitor battery will light up every 5 seconds if the monitor battery in turned on and connected to the patient securely. Do not push and hold the circle down as this turns the monitor battery off. The cell phone will locate the monitor battery. A screen will appear on the cell phone checking the connection of your monitor strip. This may read poor connection initially but change to good connection within the next minute. Once your monitor accepts the connection you will hear a series of 3 beeps followed by a climbing crescendo of beeps. A screen will appear on the cell phone showing the two monitor strip placement options. Touch the picture that demonstrates where you applied the monitor strip.  Your monitor strip and battery are waterproof. You are able to shower, bathe, or swim with the monitor on. They just ask you do not submerge deeper than 3 feet underwater. We recommend removing the monitor if you are swimming in a lake, river, or ocean.  Your monitor battery will need  to be switched to a fully charged monitor battery approximately once a week. The cell phone will alert you of an action which needs to be made.  On the cell phone, tap for details to reveal connection status, monitor battery status, and cell phone battery status. The green dots indicates your monitor is in good status. A red dot indicates there is something that needs your attention.  To record a symptom, click the circle on the monitor battery. In 30-60 seconds a list of symptoms will appear on the cell phone. Select your symptom and tap save. Your monitor will record a sustained or significant arrhythmia regardless of you clicking the button. Some patients do not feel the heart rhythm irregularities. [employer] will notify us of any serious or critical events.  Refer to instruction booklet for instructions on switching batteries, changing strips, the Do not disturb or Pause features, or any additional questions.  Call [employer] at [phone], to confirm your monitor is transmitting and record your baseline. They will answer any questions you may have regarding the monitor instructions at that time.  Returning the monitor to [employer] Place all equipment  back into blue box. Peel off strip of paper to expose adhesive and close box securely. There is a prepaid UPS shipping label on this box. Drop in a UPS drop box, or at a UPS facility like [employer]. You may also contact [employer] to arrange UPS to pick up monitor package at your home.

## 2020-03-11 NOTE — Progress Notes (Signed)
Cardiology Office Note    Date:  03/11/2020   ID:  Kelly Carr, DOB 09-15-1970, MRN [id number]  PCP:  Kelly Bock, DO  Cardiologist:  Dr. Mayford Carr  Chief Complaint: Syncope  History of Present Illness:   Kelly Carr is a 49 y.o. adult with history of depression, bipolar disorder and prior suicidal seen for unexplained syncope.  Evaluated by Dr. Mayford Carr Kelly Carr 2019 for chest pain.  Follow-up stress test and echocardiogram was reassuring.  Patient presented for evaluation of unexplained dizziness/syncope/blackout spell for past couple of months.  Few episodes has been witnessed by boyfriend and daughter.  No prodromal symptoms.  No seizure-like activity.  Patient reports loss of consciousness for about 20 minutes.  She denies chest pain, shortness of breath, orthopnea, PND, lower extremity edema or melena.  2-3 episodes of syncope per month.  Last episode yesterday after getting out of shower.  No injury.  She was evaluated by PCP last month.  Orthostatic was negative during office visit.  Encourage hydration.  TSH normal.  Patient is under a lot of stress due to family issue.  Patient was also evaluated by her psychiatrist last month and reported severe anxiety.   Past Medical History:  Diagnosis Date  . Abnormal Pap smear    Unknown results>colpo>normal  . Anxiety   . Arthritis   . Asthma   . Bipolar 1 disorder (HCC)   . Depression     Past Surgical History:  Procedure Laterality Date  . NO PAST SURGERIES      Current Medications: Prior to Admission medications   Medication Sig Start Date End Date Taking? Authorizing Provider  ARIPiprazole ER (ABILIFY MAINTENA) 400 MG SRER injection Inject 2 mLs (400 mg total) into the muscle every 28 (twenty-eight) days. 10/25/19   [name], [name], MD  hydrocerin (EUCERIN) CREA Apply 1 application topically 2 (two) times daily. 09/30/19   [name], [name], MD  hydrOXYzine (ATARAX/VISTARIL) 10 MG tablet Take 1 tablet (10 mg total)  by mouth 3 (three) times daily as needed. 01/22/20   Cherie Ouch, PA-C  megestrol (MEGACE) 40 MG tablet Take 1 tablet (40 mg total) by mouth 2 (two) times daily. Can increase to two tablets twice a day in the event of heavy bleeding 03/03/20   Anyanwu, Jethro Bastos, MD    Allergies:   Haldol [haloperidol lactate], Risperidone and related, Tramadol, and Valproic acid   Social History   Socioeconomic History  . Marital status: Single    Spouse name: Not on file  . Number of children: Not on file  . Years of education: Not on file  . Highest education level: Not on file  Occupational History  . Not on file  Tobacco Use  . Smoking status: Current Some Day Smoker    Packs/day: 1.50    Types: E-cigarettes  . Smokeless tobacco: Never Used  Vaping Use  . Vaping Use: Every day  Substance and Sexual Activity  . Alcohol use: Not Currently  . Drug use: Not Currently    Types: Marijuana    Comment: rare pot use  . Sexual activity: Yes    Partners: Male    Birth control/protection: None  Other Topics Concern  . Not on file  Social History Narrative  . Not on file   Social Determinants of Health   Financial Resource Strain:   . Difficulty of Paying Living Expenses: Not on file  Food Insecurity:   . Worried About Programme researcher, broadcasting/film/video in  the Last Year: Not on file  . Ran Out of Food in the Last Year: Not on file  Transportation Needs:   . Lack of Transportation (Medical): Not on file  . Lack of Transportation (Non-Medical): Not on file  Physical Activity:   . Days of Exercise per Week: Not on file  . Minutes of Exercise per Session: Not on file  Stress:   . Feeling of Stress : Not on file  Social Connections:   . Frequency of Communication with Friends and Family: Not on file  . Frequency of Social Gatherings with Friends and Family: Not on file  . Attends Religious Services: Not on file  . Active Member of Clubs or Organizations: Not on file  . Attends Banker  Meetings: Not on file  . Marital Status: Not on file     Family History:  The patient's family history includes Bipolar disorder in her cousin and nephew; Depression in her daughter; Diabetes in her maternal grandfather, maternal grandmother, and mother; Heart disease in her maternal grandfather and maternal grandmother; Heart disease (age of onset: 29) in her mother; Hypertension in her mother; Schizophrenia in her mother.   ROS:   Please see the history of present illness.    ROS All other systems reviewed and are negative.   PHYSICAL EXAM:   VS:  BP 112/66   Pulse 83   Ht 5\' 4"  (1.626 m)   Wt 159 lb (72.1 kg)   SpO2 99%   BMI 27.29 kg/m    GEN: Well nourished, well developed, in no acute distress  HEENT: normal  Neck: no JVD, carotid bruits, or masses Cardiac: RRR; no murmurs, rubs, or gallops,no edema  Respiratory:  clear to auscultation bilaterally, normal work of breathing GI: soft, nontender, nondistended, + BS MS: no deformity or atrophy  Skin: warm and dry, no rash Neuro:  Alert and Oriented x 3, Strength and sensation are intact Psych: euthymic mood, full affect  Wt Readings from Last 3 Encounters:  03/11/20 159 lb (72.1 kg)  02/19/20 158 lb (71.7 kg)  02/14/20 154 lb (69.9 kg)      Studies/Labs Reviewed:   EKG:  EKG is not  ordered today.  The ekg done 02/14/2020 demonstrates sinus bradycardia at rate of 53 bpm  Recent Labs: 09/24/2019: Magnesium 2.4 09/27/2019: ALT 131 02/14/2020: BUN 10; Creatinine, Ser 0.58; Hemoglobin 12.3; Platelets 252; Potassium 3.9; Sodium 137 02/19/2020: TSH 1.210   Lipid Panel    Component Value Date/Time   CHOL 202 (H) 08/09/2019 0713   TRIG 80 08/09/2019 0713   HDL 50 08/09/2019 0713   CHOLHDL 4.0 08/09/2019 0713   VLDL 16 08/09/2019 0713   LDLCALC 136 (H) 08/09/2019 0713    Additional studies/ records that were reviewed today include:   Echocardiogram: 09/2017 Study Conclusions   - Left ventricle: The cavity size was  normal. Wall thickness was  normal. Systolic function was normal. The estimated ejection  fraction was in the range of 55% to 60%. Wall motion was normal;  there were no regional wall motion abnormalities. Left  ventricular diastolic function parameters were normal.   Impressions:   - Normal study.   Stress test 09/2017  Nuclear stress EF: 63%.  Blood pressure demonstrated a normal response to exercise.  No T wave inversion was noted during stress.  There was no ST segment deviation noted during stress.  This is a low risk study.   No significant reversible ischemia. Possible mild septal attenuation  artifact. LVEF 63% with normal wall motion. This is a low risk study.     ASSESSMENT & PLAN:   1. Syncope, unspecified - No clear etiology.  Previously witnessed by boyfriend and daughter per patient.  No prodromal symptoms.  TSH normal.  She has been evaluated by her psychiatrics.  No seizure-like activity.  Not orthostatic at PCP visit. -Prior stress test and echocardiogram reassuring in 2019.  Her cardiac exam is normal. -Get 30-days event monitor to complete work-up. - Advised not to drive for 28-months - If recurrent episode, may consider neurology referral per PCP    Medication Adjustments/Labs and Tests Ordered: Current medicines are reviewed at length with the patient today.  Concerns regarding medicines are outlined above.  Medication changes, Labs and Tests ordered today are listed in the Patient Instructions below. Patient Instructions  Medication Instructions:  Your physician recommends that you continue on your current medications as directed. Please refer to the Current Medication list given to you today.   *If you need a refill on your cardiac medications before your next appointment, please call your pharmacy*   Lab Work: None ordered  If you have labs (blood work) drawn today and your tests are completely normal, you will receive your results only  by: Marland Kitchen MyChart Message (if you have MyChart) OR . A paper copy in the mail If you have any lab test that is abnormal or we need to change your treatment, we will call you to review the results.   Testing/Procedures: Your physician has recommended that you wear an event monitor.  This will be mailed to your home.  Event monitors are medical devices that record the heart's electrical activity. Doctors most often [place] these monitors to diagnose arrhythmias. Arrhythmias are problems with the speed or rhythm of the heartbeat. The monitor is a small, portable device. You can wear one while you do your normal daily activities. This is usually used to diagnose what is causing palpitations/syncope (passing out).  .    Follow-Up: At [facility], you and your health needs are our priority.  As part of our continuing mission to provide you with exceptional heart care, we have created designated Provider Care Teams.  These Care Teams include your primary Cardiologist (physician) and Advanced Practice Providers (APPs -  Physician Assistants and Nurse Practitioners) who all work together to provide you with the care you need, when you need it.  We recommend signing up for the patient portal called "MyChart".  Sign up information is provided on this After Visit Summary.  MyChart is used to connect with patients for Virtual Visits (Telemedicine).  Patients are able to view lab/test results, encounter notes, upcoming appointments, etc.  Non-urgent messages can be sent to your provider as well.   To learn more about what you can do with MyChart, go to [email or web address].    Your next appointment:   AS NEEDED   The format for your next appointment:     Provider:      Other Instructions      Leonides Schanz Manson Passey, [place]  03/11/2020 10:38 AM    [facility] [address] [place], [place], [place]  [address] Phone: [phone]; Fax: [phone]

## 2020-03-11 NOTE — Telephone Encounter (Signed)
Enrolled patient for a 30 day [employer] Event Monitor to be mailed to patients home  

## 2020-03-16 ENCOUNTER — Other Ambulatory Visit: Payer: Self-pay

## 2020-03-16 DIAGNOSIS — Z20822 Contact with and (suspected) exposure to covid-19: Secondary | ICD-10-CM

## 2020-03-20 LAB — NOVEL CORONAVIRUS, NAA: SARS-CoV-2, NAA: NOT DETECTED

## 2020-03-27 ENCOUNTER — Ambulatory Visit (INDEPENDENT_AMBULATORY_CARE_PROVIDER_SITE_OTHER): Payer: Self-pay

## 2020-03-27 ENCOUNTER — Other Ambulatory Visit: Payer: Self-pay

## 2020-03-27 DIAGNOSIS — F3162 Bipolar disorder, current episode mixed, moderate: Secondary | ICD-10-CM

## 2020-03-29 ENCOUNTER — Telehealth: Payer: Self-pay

## 2020-03-29 ENCOUNTER — Other Ambulatory Visit: Payer: Self-pay | Admitting: Physician Assistant

## 2020-03-29 MED ORDER — BUSPIRONE HCL 15 MG PO TABS: ORAL_TABLET | ORAL | 1 refills | Status: DC

## 2020-03-29 NOTE — Telephone Encounter (Signed)
Rx was sent  

## 2020-03-29 NOTE — Progress Notes (Signed)
Pt here for hermonthly injection ofAbilify Maintena 400 mg, injection given in left upperglutealarea intramuscular.Tolerated well, no complaints.Patient reports her anxiety has increased and asking if she can go back on Buspirone, informed her I would discuss with [name] and follow up with her if agreed. She uses [facility] on [address] [place]. She will be due back in 28 days for her next injection. She is scheduled with Melony Overly, PA-C for follow up on 04/10/2020.  LOT [id number] EXP JUL 2023

## 2020-03-29 NOTE — Telephone Encounter (Signed)
Patient asking if she could go back on Buspirone for her anxiety, she reports the hydroxyzine helps some but she's wanting something more for maintenance.    Uses [facility] [address], [place]

## 2020-04-10 ENCOUNTER — Encounter: Payer: Self-pay | Admitting: Physician Assistant

## 2020-04-10 ENCOUNTER — Telehealth (INDEPENDENT_AMBULATORY_CARE_PROVIDER_SITE_OTHER): Payer: Self-pay | Admitting: Physician Assistant

## 2020-04-10 DIAGNOSIS — F4310 Post-traumatic stress disorder, unspecified: Secondary | ICD-10-CM

## 2020-04-10 DIAGNOSIS — F319 Bipolar disorder, unspecified: Secondary | ICD-10-CM

## 2020-04-10 DIAGNOSIS — F411 Generalized anxiety disorder: Secondary | ICD-10-CM

## 2020-04-10 MED ORDER — BUSPIRONE HCL 15 MG PO TABS: 15.0000 mg | ORAL_TABLET | Freq: Two times a day (BID) | ORAL | 3 refills | Status: DC

## 2020-04-10 NOTE — Progress Notes (Signed)
[facility] Med Check  Patient ID: Kelly Carr,  MRN: [id number]  PCP: Kelly Bock, DO  Date of Evaluation: 04/10/2020 Time spent:20 minutes  Chief Complaint:  Chief Complaint    Follow-up     Virtual Visit via Telehealth  I connected with patient by a video enabled telemedicine application with their informed consent, and verified patient privacy and that I am speaking with the correct person using two identifiers.  I am private, in my office and the patient is at home.  I discussed the limitations, risks, security and privacy concerns of performing an evaluation and management service by video and the availability of in person appointments. I also discussed with the patient that there may be a patient responsible charge related to this service. The patient expressed understanding and agreed to proceed.   I discussed the assessment and treatment plan with the patient. The patient was provided an opportunity to ask questions and all were answered. The patient agreed with the plan and demonstrated an understanding of the instructions.   The patient was advised to call back or seek an in-person evaluation if the symptoms worsen or if the condition fails to improve as anticipated.  I provided 20 minutes of non-face-to-face time during this encounter.  HISTORY/CURRENT STATUS: HPI for routine med check.  Lost her job at [employer] earlier this week. They said she was out of work too much.  She's looking for a job.  She has 1 more court date to go when she was charged with resisting arrest, which she states she did not do.  Right now she is in anger management classes.  She is on her fifth class out of 8 required classes.  She also has to do community service for 30 hours.  She is not sure what will be required of her after this next court date.  Her daughter has moved back in with her.  She is sad because she does not have a relationship with her son like she wants to but otherwise  things are better.  She feels like the anxiety is better controlled.  Since the last visit, she was started on BuSpar after reporting to her nurse that she was having more generalized anxiety on a routine basis.  She had taken BuSpar in the past and felt like it would help again.  It has helped quite a bit already.  The hydroxyzine has not been helping at all.  Kelly Carr reports stable mood.  She is diligent about having her monthly Abilify maintaina injections.  States she is able to enjoy things.  Her energy and motivation are at her baseline.  She is not isolating.  Not crying easily.  Hygiene is normal.  Memory and concentration are good.  Sleeps well most of the time.  No suicidal or homicidal thoughts.   Patient denies increased energy with decreased need for sleep, no increased talkativeness, no racing thoughts, no impulsivity or risky behaviors, no increased spending, no increased libido, no grandiosity.  No increased irritability, no hallucinations, no paranoia.   Denies seizures, numbness, tingling, tremor, tics, unsteady gait, slurred speech, confusion. Denies muscle or joint pain, stiffness, or dystonia.  Individual Medical History/ Review of Systems: Changes? :No    Past medications for mental health diagnoses include: Gabapentin caused lethargy, Ativan, BuSpar, Wellbutrin, Latuda, trazodone, Cogentin, Geodon, Trileptal, Vraylar, prazosin, Vistaril, Abilify maintainena,Abilify  Allergies: Haldol [haloperidol lactate], Risperidone and related, Tramadol, and Valproic acid  Current Medications:  Current Outpatient Medications:    ARIPiprazole  ER (ABILIFY MAINTENA) 400 MG SRER injection, Inject 2 mLs (400 mg total) into the muscle every 28 (twenty-eight) days., Disp: 1 each, Rfl: 2   busPIRone (BUSPAR) 15 MG tablet, Take 1 tablet (15 mg total) by mouth 2 (two) times daily., Disp: 180 tablet, Rfl: 3 Medication Side Effects: none  Family Medical/ Social History: Changes? Yes see  HPI  MENTAL HEALTH EXAM:  There were no vitals taken for this visit.There is no height or weight on file to calculate BMI.  General Appearance: Casual, Neat and Well Groomed  Eye Contact:  Good  Speech:  Clear and Coherent and Normal Rate  Volume:  Normal  Mood:  Euthymic  Affect:  Appropriate  Thought Process:  Goal Directed and Descriptions of Associations: Intact  Orientation:  Full (Time, Place, and Person)  Thought Content: Logical   Suicidal Thoughts:  No  Homicidal Thoughts:  No  Memory:  WNL  Judgement:  Good  Insight:  Good  Psychomotor Activity:  Normal  Concentration:  Concentration: Good and Attention Span: Good  Recall:  Good  Fund of Knowledge: Good  Language: Good  Assets:  Desire for Improvement  ADL's:  Intact  Cognition: WNL  Prognosis:  Good    DIAGNOSES:    ICD-10-CM   1. Generalized anxiety disorder  F41.1   2. Bipolar I disorder (HCC)  F31.9   3. PTSD (post-traumatic stress disorder)  F43.10     Receiving Psychotherapy: Yes  group therapy    RECOMMENDATIONS:  PDMP was reviewed.  I provided 20 minutes of nonface-to-face time during this encounter. Continue BuSpar 15 mg, 1 p.o. twice daily.  (She is currently on 10 mg twice daily and even though it has not been 1 week at this dose, it is fine to go ahead and increase because she is not having any side effects from it."   Continue Abilify Maintena 400 mg IM q. 28 days. Continue group therapy. Return in approximately 6 weeks.   Kelly Overly, PA-C

## 2020-04-22 ENCOUNTER — Ambulatory Visit (INDEPENDENT_AMBULATORY_CARE_PROVIDER_SITE_OTHER): Payer: Self-pay

## 2020-04-22 ENCOUNTER — Other Ambulatory Visit: Payer: Self-pay

## 2020-04-22 DIAGNOSIS — F3162 Bipolar disorder, current episode mixed, moderate: Secondary | ICD-10-CM

## 2020-04-23 NOTE — Progress Notes (Signed)
Patient arrived for her monthly Abilify Maintena 400 mg injection, last injection was on 03/27/2020. Patient has no complaints or concerns today, reports doing well. 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 She appeared excited and looking forward to that. She received her 400 mg injection in her upper left gluteal area intramuscular. Tolerated well. No complaints. Reminded her to set up her next injection in 28 days, around 05/21/2020. Patient's injection is supplied and stored at the office.  LOT [id number] EXP JUL 2023

## 2020-04-25 ENCOUNTER — Other Ambulatory Visit: Payer: Self-pay

## 2020-04-25 ENCOUNTER — Ambulatory Visit (HOSPITAL_COMMUNITY)
Admission: EM | Admit: 2020-04-25 | Discharge: 2020-04-25 | Disposition: A | Discharge status: Home / Self Care | Payer: Self-pay | Attending: Family Medicine | Admitting: Family Medicine

## 2020-04-25 ENCOUNTER — Encounter (HOSPITAL_COMMUNITY): Payer: Self-pay | Admitting: *Deleted

## 2020-04-25 DIAGNOSIS — J029 Acute pharyngitis, unspecified: Secondary | ICD-10-CM | POA: Insufficient documentation

## 2020-04-25 DIAGNOSIS — B349 Viral infection, unspecified: Secondary | ICD-10-CM | POA: Insufficient documentation

## 2020-04-25 DIAGNOSIS — F17210 Nicotine dependence, cigarettes, uncomplicated: Secondary | ICD-10-CM | POA: Insufficient documentation

## 2020-04-25 DIAGNOSIS — R062 Wheezing: Secondary | ICD-10-CM | POA: Insufficient documentation

## 2020-04-25 DIAGNOSIS — R6883 Chills (without fever): Secondary | ICD-10-CM | POA: Insufficient documentation

## 2020-04-25 DIAGNOSIS — R519 Headache, unspecified: Secondary | ICD-10-CM | POA: Insufficient documentation

## 2020-04-25 DIAGNOSIS — R0981 Nasal congestion: Secondary | ICD-10-CM | POA: Insufficient documentation

## 2020-04-25 DIAGNOSIS — R059 Cough, unspecified: Secondary | ICD-10-CM | POA: Insufficient documentation

## 2020-04-25 DIAGNOSIS — Z20822 Contact with and (suspected) exposure to covid-19: Secondary | ICD-10-CM | POA: Insufficient documentation

## 2020-04-25 DIAGNOSIS — R5383 Other fatigue: Secondary | ICD-10-CM | POA: Insufficient documentation

## 2020-04-25 DIAGNOSIS — J45909 Unspecified asthma, uncomplicated: Secondary | ICD-10-CM | POA: Insufficient documentation

## 2020-04-25 DIAGNOSIS — R52 Pain, unspecified: Secondary | ICD-10-CM | POA: Insufficient documentation

## 2020-04-25 LAB — RESPIRATORY PANEL BY RT PCR (FLU A&B, COVID)
Influenza A by PCR: NEGATIVE
Influenza B by PCR: NEGATIVE
SARS Coronavirus 2 by RT PCR: NEGATIVE

## 2020-04-25 MED ORDER — CETIRIZINE-PSEUDOEPHEDRINE ER 5-120 MG PO TB12: 1.0000 | ORAL_TABLET | Freq: Every day | ORAL | 0 refills | Status: DC

## 2020-04-25 MED ORDER — PREDNISONE 10 MG (21) PO TBPK: ORAL_TABLET | Freq: Every day | ORAL | 0 refills | Status: AC

## 2020-04-25 MED ORDER — FLUTICASONE PROPIONATE 50 MCG/ACT NA SUSP: 2.0000 | Freq: Every day | NASAL | 2 refills | Status: DC

## 2020-04-25 NOTE — Discharge Instructions (Signed)
I have sent in Zyrtec D for you to take daily  I have sent in flonase for you to use 2 sprays in each nostril for congestion  I have sent in a prednisone taper for you to take for 6 days. 6 tablets on day one, 5 tablets on day two, 4 tablets on day three, 3 tablets on day four, 2 tablets on day five, and 1 tablet on day six.  Your COVID and Flu tests are pending.  You should self quarantine until the test results are back.    Take Tylenol or ibuprofen as needed for fever or discomfort.  Rest and keep yourself hydrated.    Follow-up with your primary care provider if your symptoms are not improving.

## 2020-04-25 NOTE — ED Provider Notes (Signed)
[facility]   [id number] 04/25/20 Arrival Time: 1550   CC: COVID symptoms  SUBJECTIVE: History from: patient.  Kelly Carr is a 49 y.o. adult who presents with abrupt onset of nasal congestion, PND, sore throat, body aches and persistent dry cough for 3 days. Reports that her daughter was sick but cannot recall with what. Denies sick exposure to COVID, flu or strep. Denies recent travel. Has negative history of Covid. Has not completed Covid vaccines. Has not taken OTC medications for this. There are no aggravating or alleviating factors. Denies previous symptoms in the past. Denies fever, sinus pain, rhinorrhea, SOB, wheezing, chest pain, nausea, changes in bowel or bladder habits.    ROS: As per HPI.  All other pertinent ROS negative.     Past Medical History:  Diagnosis Date  . Abnormal Pap smear    Unknown results>colpo>normal  . Anxiety   . Arthritis   . Asthma   . Bipolar 1 disorder (HCC)   . Depression    Past Surgical History:  Procedure Laterality Date  . NO PAST SURGERIES     Allergies  Allergen Reactions  . Haldol [Haloperidol Lactate] Other (See Comments)    Syncope   . Risperidone And Related Other (See Comments)    Zones out  . Tramadol Itching  . Valproic Acid    No current facility-administered medications on file prior to encounter.   Current Outpatient Medications on File Prior to Encounter  Medication Sig Dispense Refill  . ARIPiprazole ER (ABILIFY MAINTENA) 400 MG SRER injection Inject 2 mLs (400 mg total) into the muscle every 28 (twenty-eight) days. 1 each 2  . busPIRone (BUSPAR) 15 MG tablet Take 1 tablet (15 mg total) by mouth 2 (two) times daily. 180 tablet 3   Social History   Socioeconomic History  . Marital status: Single    Spouse name: Not on file  . Number of children: Not on file  . Years of education: Not on file  . Highest education level: Not on file  Occupational History  . Not on file  Tobacco Use  . Smoking  status: Current Some Day Smoker    Packs/day: 0.10    Types: E-cigarettes  . Smokeless tobacco: Never Used  Vaping Use  . Vaping Use: Every day  Substance and Sexual Activity  . Alcohol use: Not Currently  . Drug use: Not Currently    Types: Marijuana    Comment: rare pot use  . Sexual activity: Yes    Partners: Male    Birth control/protection: None  Other Topics Concern  . Not on file  Social History Narrative  . Not on file   Social Determinants of Health   Financial Resource Strain:   . Difficulty of Paying Living Expenses: Not on file  Food Insecurity:   . Worried About Programme researcher, broadcasting/film/video in the Last Year: Not on file  . Ran Out of Food in the Last Year: Not on file  Transportation Needs:   . Lack of Transportation (Medical): Not on file  . Lack of Transportation (Non-Medical): Not on file  Physical Activity:   . Days of Exercise per Week: Not on file  . Minutes of Exercise per Session: Not on file  Stress:   . Feeling of Stress : Not on file  Social Connections:   . Frequency of Communication with Friends and Family: Not on file  . Frequency of Social Gatherings with Friends and Family: Not on file  . Attends  Religious Services: Not on file  . Active Member of Clubs or Organizations: Not on file  . Attends Banker Meetings: Not on file  . Marital Status: Not on file  Intimate Partner Violence:   . Fear of Current or Ex-Partner: Not on file  . Emotionally Abused: Not on file  . Physically Abused: Not on file  . Sexually Abused: Not on file   Family History  Problem Relation Age of Onset  . Diabetes Mother   . Hypertension Mother   . Heart disease Mother 32  . Schizophrenia Mother   . Diabetes Maternal Grandmother   . Heart disease Maternal Grandmother   . Diabetes Maternal Grandfather   . Heart disease Maternal Grandfather   . Bipolar disorder Cousin   . Bipolar disorder Nephew   . Depression Daughter     OBJECTIVE:  Vitals:    04/25/20 1636 04/25/20 1640  BP:  117/77  Pulse:  69  Resp:  18  Temp:  98.7 F (37.1 C)  TempSrc:  Oral  SpO2:  99%  Weight: 160 lb (72.6 kg)   Height: 5\' 4"  (1.626 m)      General appearance: alert; appears fatigued, but nontoxic; speaking in full sentences and tolerating own secretions HEENT: NCAT; Ears: EACs clear, TMs pearly gray; Eyes: PERRL.  EOM grossly intact. Sinuses: nontender; Nose: nares patent with rhinorrhea, Throat: oropharynx erythematous, cobblestoning present, tonsils non erythematous or enlarged, uvula midline  Neck: supple with LAD Lungs: unlabored respirations, symmetrical air entry; cough: absent; no respiratory distress; mild wheezing in bilateral lower lobes Heart: regular rate and rhythm. Radial pulses 2+ symmetrical bilaterally Skin: warm and dry Psychological: alert and cooperative; normal mood and affect  LABS:  No results found for this or any previous visit (from the past 24 hour(s)).   ASSESSMENT & PLAN:  1. Viral illness   2. Cough   3. Body aches   4. Chills   5. Other fatigue   6. Nonintractable headache, unspecified chronicity pattern, unspecified headache type   7. Wheezing   8. Nasal congestion   9. Sore throat     Meds ordered this encounter  Medications  . predniSONE (STERAPRED UNI-PAK 21 TAB) 10 MG (21) TBPK tablet    Sig: Take by mouth daily for 6 days. Take 6 tablets on day 1, 5 tablets on day 2, 4 tablets on day 3, 3 tablets on day 4, 2 tablets on day 5, 1 tablet on day 6    Dispense:  21 tablet    Refill:  0    Order Specific Question:   Supervising Provider    Answer:   Merrilee Jansky  . cetirizine-pseudoephedrine (ZYRTEC-D) 5-120 MG tablet    Sig: Take 1 tablet by mouth daily.    Dispense:  30 tablet    Refill:  0    Order Specific Question:   Supervising Provider    Answer:   [id number] Merrilee Jansky  . fluticasone (FLONASE) 50 MCG/ACT nasal spray    Sig: Place 2 sprays into both nostrils daily.     Dispense:  9.9 mL    Refill:  2    Order Specific Question:   Supervising Provider    Answer:   [id number] Merrilee Jansky    Will at least for bronchitis until labs are back Prescribed steroid taper Prescribed zyrtec D Prescribed flonase Work note provided  COVID and flu testing ordered.  It will take between 1-2 days for test  results.  Someone will contact you regarding abnormal results.    Patient should remain in quarantine until they have received Covid results.  If negative you may resume normal activities (go back to work/school) while practicing hand hygiene, social distance, and mask wearing.  If positive, patient should remain in quarantine for 10 days from symptom onset AND greater than 72 hours after symptoms resolution (absence of fever without the use of fever-reducing medication and improvement in respiratory symptoms), whichever is longer Get plenty of rest and push fluids Use OTC zyrtec for nasal congestion, runny nose, and/or sore throat Use OTC flonase for nasal congestion and runny nose Use medications daily for symptom relief Use OTC medications like ibuprofen or tylenol as needed fever or pain Call or go to the ED if you have any new or worsening symptoms such as fever, worsening cough, shortness of breath, chest tightness, chest pain, turning blue, changes in mental status.  Reviewed expectations re: course of current medical issues. Questions answered. Outlined signs and symptoms indicating need for more acute intervention. Patient verbalized understanding. After Visit Summary given.         Moshe Cipro, NP 04/25/20 1728

## 2020-04-25 NOTE — ED Triage Notes (Signed)
PT reports for past 3 days having sore throat ,body aches and congestion.

## 2020-05-13 ENCOUNTER — Emergency Department
Admission: EM | Admit: 2020-05-13 | Discharge: 2020-05-14 | Disposition: A | Discharge status: Other Acute Inpatient Hospital | Payer: Self-pay | Attending: Emergency Medicine | Admitting: Emergency Medicine

## 2020-05-13 ENCOUNTER — Other Ambulatory Visit: Payer: Self-pay

## 2020-05-13 DIAGNOSIS — K219 Gastro-esophageal reflux disease without esophagitis: Secondary | ICD-10-CM | POA: Diagnosis present

## 2020-05-13 DIAGNOSIS — F23 Brief psychotic disorder: Secondary | ICD-10-CM

## 2020-05-13 DIAGNOSIS — F3163 Bipolar disorder, current episode mixed, severe, without psychotic features: Secondary | ICD-10-CM

## 2020-05-13 DIAGNOSIS — F3160 Bipolar disorder, current episode mixed, unspecified: Secondary | ICD-10-CM | POA: Diagnosis present

## 2020-05-13 DIAGNOSIS — R462 Strange and inexplicable behavior: Secondary | ICD-10-CM | POA: Insufficient documentation

## 2020-05-13 DIAGNOSIS — F1210 Cannabis abuse, uncomplicated: Secondary | ICD-10-CM | POA: Diagnosis present

## 2020-05-13 DIAGNOSIS — J45909 Unspecified asthma, uncomplicated: Secondary | ICD-10-CM | POA: Insufficient documentation

## 2020-05-13 DIAGNOSIS — Z20822 Contact with and (suspected) exposure to covid-19: Secondary | ICD-10-CM | POA: Insufficient documentation

## 2020-05-13 LAB — RESP PANEL BY RT-PCR (FLU A&B, COVID) ARPGX2
Influenza A by PCR: NEGATIVE
Influenza B by PCR: NEGATIVE
SARS Coronavirus 2 by RT PCR: NEGATIVE

## 2020-05-13 LAB — COMPREHENSIVE METABOLIC PANEL
ALT: 33 U/L (ref 0–44)
AST: 46 U/L — ABNORMAL HIGH (ref 15–41)
Albumin: 4.1 g/dL (ref 3.5–5.0)
Alkaline Phosphatase: 73 U/L (ref 38–126)
Anion gap: 9 (ref 5–15)
BUN: 9 mg/dL (ref 6–20)
CO2: 25 mmol/L (ref 22–32)
Calcium: 9.1 mg/dL (ref 8.9–10.3)
Chloride: 104 mmol/L (ref 98–111)
Creatinine, Ser: 0.74 mg/dL (ref 0.44–1.00)
GFR, Estimated: >60 mL/min (ref >60)
Glucose, Bld: 117 mg/dL — ABNORMAL HIGH (ref 70–99)
Potassium: 3.3 mmol/L — ABNORMAL LOW (ref 3.5–5.1)
Sodium: 138 mmol/L (ref 135–145)
Total Bilirubin: 0.7 mg/dL (ref 0.3–1.2)
Total Protein: 7.4 g/dL (ref 6.5–8.1)

## 2020-05-13 LAB — LIPID PANEL
Cholesterol: 207 mg/dL — ABNORMAL HIGH (ref 0–200)
HDL: 61 mg/dL (ref >40)
LDL Cholesterol: 135 mg/dL — ABNORMAL HIGH (ref 0–99)
Total CHOL/HDL Ratio: 3.4 RATIO
Triglycerides: 56 mg/dL (ref <150)
VLDL: 11 mg/dL (ref 0–40)

## 2020-05-13 LAB — ACETAMINOPHEN LEVEL: Acetaminophen (Tylenol), Serum: <10 ug/mL — ABNORMAL LOW (ref 10–30)

## 2020-05-13 LAB — URINE DRUG SCREEN, QUALITATIVE (ARMC ONLY)
Amphetamines, Ur Screen: NOT DETECTED
Barbiturates, Ur Screen: NOT DETECTED
Benzodiazepine, Ur Scrn: NOT DETECTED
Cannabinoid 50 Ng, Ur ~~LOC~~: POSITIVE — AB
Cocaine Metabolite,Ur ~~LOC~~: NOT DETECTED
MDMA (Ecstasy)Ur Screen: NOT DETECTED
Methadone Scn, Ur: NOT DETECTED
Opiate, Ur Screen: NOT DETECTED
Phencyclidine (PCP) Ur S: NOT DETECTED
Tricyclic, Ur Screen: NOT DETECTED

## 2020-05-13 LAB — CBC
HCT: 37.1 % (ref 36.0–46.0)
Hemoglobin: 12.4 g/dL (ref 12.0–15.0)
MCH: 28.6 pg (ref 26.0–34.0)
MCHC: 33.4 g/dL (ref 30.0–36.0)
MCV: 85.7 fL (ref 80.0–100.0)
Platelets: 335 10*3/uL (ref 150–400)
RBC: 4.33 MIL/uL (ref 3.87–5.11)
RDW: 13 % (ref 11.5–15.5)
WBC: 15.5 10*3/uL — ABNORMAL HIGH (ref 4.0–10.5)
nRBC: 0 % (ref 0.0–0.2)

## 2020-05-13 LAB — PREGNANCY, URINE: Preg Test, Ur: NEGATIVE

## 2020-05-13 LAB — HEMOGLOBIN A1C
Hgb A1c MFr Bld: 5.3 % (ref 4.8–5.6)
Mean Plasma Glucose: 105.41 mg/dL

## 2020-05-13 LAB — ETHANOL: Alcohol, Ethyl (B): <10 mg/dL (ref <10)

## 2020-05-13 LAB — SALICYLATE LEVEL: Salicylate Lvl: <7 mg/dL — ABNORMAL LOW (ref 7.0–30.0)

## 2020-05-13 MED ORDER — ACETAMINOPHEN 500 MG PO TABS
ORAL_TABLET | ORAL | Status: AC
  Administered 2020-05-13: 1000 mg via ORAL
  Filled 2020-05-13: qty 2

## 2020-05-13 MED ORDER — DROPERIDOL 2.5 MG/ML IJ SOLN
10.0000 mg | Freq: Once | INTRAMUSCULAR | Status: AC
  Administered 2020-05-13: 10 mg via INTRAMUSCULAR
  Filled 2020-05-13: qty 4

## 2020-05-13 MED ORDER — ACETAMINOPHEN 500 MG PO TABS
1000.0000 mg | ORAL_TABLET | Freq: Once | ORAL | Status: AC
  Administered 2020-05-13: 1000 mg via ORAL

## 2020-05-13 MED ORDER — ACETAMINOPHEN 500 MG PO TABS: 1000.0000 mg | ORAL_TABLET | Freq: Once | ORAL | Status: AC

## 2020-05-13 MED ORDER — ZIPRASIDONE MESYLATE 20 MG IM SOLR
10.0000 mg | Freq: Once | INTRAMUSCULAR | Status: AC
  Administered 2020-05-13: 10 mg via INTRAMUSCULAR

## 2020-05-13 MED ORDER — OXCARBAZEPINE 300 MG PO TABS
600.0000 mg | ORAL_TABLET | Freq: Two times a day (BID) | ORAL | Status: DC
  Administered 2020-05-13 – 2020-05-14 (×3): 600 mg via ORAL
  Filled 2020-05-13 (×3): qty 2

## 2020-05-13 NOTE — ED Notes (Signed)
Hourly rounding completed at this time, patient currently asleep in room. No complaints, stable, and in no acute distress. Q15 minute rounds and monitoring via Rover and Officer to continue. 

## 2020-05-13 NOTE — ED Notes (Signed)
INVOLUNTARY/awaiting placement °
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
## 2020-05-13 NOTE — ED Notes (Signed)
Pt is asleep and under blanket, lights dimmed and door pulled closed. Will continue to monitor per [place] guidelines

## 2020-05-13 NOTE — ED Notes (Signed)
Hourly rounding completed at this time, patient currently awake in room. No complaints, stable, and in no acute distress. Q15 minute rounds and monitoring via Rover and Officer to continue. °

## 2020-05-13 NOTE — ED Notes (Signed)
[employer] removes pt cuffs at this time, pt calm and cooperative

## 2020-05-13 NOTE — BH Assessment (Signed)
Patient is to be admitted to [facility] by Dr. Toni Amend.  Attending Physician will be Dr. Neale Burly.   Patient has been assigned to room 309, by [facility] Charge Nurse [place].   Intake Paper Work has been signed and placed on patient chart.  ER staff is aware of the admission:  Lynden Ang, ER Secretary    Dr. Don Perking, ER MD   Thayer Ohm, Patient's Nurse   [name], Patient Access.  Pt tentatively slated to transport to [facility] after 9AM 05/14/20 contingent on pt's behavioral status remaining stabilized.
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
## 2020-05-13 NOTE — ED Notes (Signed)
Pt sitting in room crying loudly, unclear why and stops when spoken to. Will continue to monitor.

## 2020-05-13 NOTE — ED Notes (Signed)
Pt continues to yell and curse at staff, pt restless and aggressive toward staff and officers. MD aware as pt can be heard down hall and at desk. Order to follow

## 2020-05-13 NOTE — ED Notes (Signed)
Pt did go to restroom with staff but knocked urine back into toilet after collecting so sample could not be collected, returned to room and states that staff cannot have her urine to test

## 2020-05-13 NOTE — ED Notes (Signed)
Pt requesting tylenol for generalized body pain. Verbal order for 1g tylenol from MD Ellin Fitzgibbons placed at this time
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
## 2020-05-13 NOTE — ED Notes (Signed)
Pt is continuously yelling and cursing staff. Pt was dressed out by Adonis Housekeeper, RN and officers. Pt belongings include:  Black/red shoes Pants Underwear Black sweatshirt Glasses Black bracelet 2 rings

## 2020-05-13 NOTE — ED Notes (Signed)
Hourly rounding completed at this time, patient currently awake in restroom. No complaints, stable, and in no acute distress. Q15 minute rounds and monitoring via Rover and Officer to continue. 

## 2020-05-13 NOTE — ED Notes (Signed)
Report received from Annie, RN including situation, background, assessment and recommendations. Patient sleeping, respirations regular and unlabored. Q15 minute rounds and security camera observation to continue. Will assess patient once awake. 

## 2020-05-13 NOTE — ED Notes (Signed)
Patient transferred from Triage to room 21 after dressing out and screening for contraband. Report received from Sue Lush, RN including situation, background, assessment and recommendations. Pt oriented to [employer] including Q15 minute rounds as well as Psychologist, counselling for their protection. Patient is alert and oriented, warm and dry in no acute distress. Patient denies SI, HI, and AVH. Pt. Encouraged to let this nurse know if needs arise.
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
## 2020-05-13 NOTE — ED Notes (Signed)
Pt yells, " you know what, I am [employer] Almighty"

## 2020-05-13 NOTE — BH Assessment (Signed)
Pt currently under review with [facility] BMU 
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
## 2020-05-14 ENCOUNTER — Other Ambulatory Visit: Payer: Self-pay

## 2020-05-14 ENCOUNTER — Encounter: Payer: Self-pay | Admitting: Psychiatry

## 2020-05-14 ENCOUNTER — Inpatient Hospital Stay
Admission: AD | Admit: 2020-05-14 | Discharge: 2020-05-20 | DRG: 885 | Payer: No Typology Code available for payment source | Source: Intra-hospital | Attending: Behavioral Health | Admitting: Behavioral Health

## 2020-05-14 DIAGNOSIS — M25531 Pain in right wrist: Secondary | ICD-10-CM | POA: Diagnosis present

## 2020-05-14 DIAGNOSIS — F17290 Nicotine dependence, other tobacco product, uncomplicated: Secondary | ICD-10-CM | POA: Diagnosis present

## 2020-05-14 DIAGNOSIS — G4700 Insomnia, unspecified: Secondary | ICD-10-CM | POA: Diagnosis present

## 2020-05-14 DIAGNOSIS — F1210 Cannabis abuse, uncomplicated: Secondary | ICD-10-CM | POA: Diagnosis present

## 2020-05-14 DIAGNOSIS — F17200 Nicotine dependence, unspecified, uncomplicated: Secondary | ICD-10-CM | POA: Diagnosis present

## 2020-05-14 DIAGNOSIS — R45850 Homicidal ideations: Secondary | ICD-10-CM | POA: Diagnosis present

## 2020-05-14 DIAGNOSIS — F4310 Post-traumatic stress disorder, unspecified: Secondary | ICD-10-CM | POA: Diagnosis present

## 2020-05-14 DIAGNOSIS — Z9151 Personal history of suicidal behavior: Secondary | ICD-10-CM | POA: Diagnosis not present

## 2020-05-14 DIAGNOSIS — M25539 Pain in unspecified wrist: Secondary | ICD-10-CM

## 2020-05-14 DIAGNOSIS — F3163 Bipolar disorder, current episode mixed, severe, without psychotic features: Principal | ICD-10-CM | POA: Diagnosis present

## 2020-05-14 DIAGNOSIS — Z20822 Contact with and (suspected) exposure to covid-19: Secondary | ICD-10-CM | POA: Diagnosis present

## 2020-05-14 MED ORDER — ALUM & MAG HYDROXIDE-SIMETH 200-200-20 MG/5ML PO SUSP: 30.0000 mL | ORAL | Status: DC | PRN

## 2020-05-14 MED ORDER — FLUTICASONE PROPIONATE 50 MCG/ACT NA SUSP
2.0000 | Freq: Every day | NASAL | Status: DC
  Administered 2020-05-14 – 2020-05-20 (×3): 2 via NASAL
  Filled 2020-05-14: qty 16

## 2020-05-14 MED ORDER — ZIPRASIDONE MESYLATE 20 MG IM SOLR: 20.0000 mg | Freq: Three times a day (TID) | INTRAMUSCULAR | Status: DC | PRN

## 2020-05-14 MED ORDER — LORATADINE 10 MG PO TABS
10.0000 mg | ORAL_TABLET | Freq: Every day | ORAL | Status: DC
  Administered 2020-05-14 – 2020-05-20 (×7): 10 mg via ORAL
  Filled 2020-05-14 (×7): qty 1

## 2020-05-14 MED ORDER — ACETAMINOPHEN 325 MG PO TABS
650.0000 mg | ORAL_TABLET | Freq: Four times a day (QID) | ORAL | Status: DC | PRN
  Administered 2020-05-14 – 2020-05-15 (×3): 650 mg via ORAL
  Filled 2020-05-14 (×3): qty 2

## 2020-05-14 MED ORDER — OXCARBAZEPINE 300 MG PO TABS
600.0000 mg | ORAL_TABLET | Freq: Two times a day (BID) | ORAL | Status: DC
  Administered 2020-05-14 – 2020-05-18 (×8): 600 mg via ORAL
  Filled 2020-05-14 (×8): qty 2

## 2020-05-14 MED ORDER — NICOTINE 7 MG/24HR TD PT24
7.0000 mg | MEDICATED_PATCH | Freq: Every day | TRANSDERMAL | Status: DC
  Administered 2020-05-14 – 2020-05-20 (×7): 7 mg via TRANSDERMAL
  Filled 2020-05-14 (×8): qty 1

## 2020-05-14 MED ORDER — ZIPRASIDONE HCL 20 MG PO CAPS
20.0000 mg | ORAL_CAPSULE | Freq: Four times a day (QID) | ORAL | Status: DC | PRN
  Administered 2020-05-17 – 2020-05-19 (×4): 20 mg via ORAL
  Filled 2020-05-14 (×5): qty 1

## 2020-05-14 MED ORDER — MAGNESIUM HYDROXIDE 400 MG/5ML PO SUSP
30.0000 mL | Freq: Every day | ORAL | Status: DC | PRN
  Administered 2020-05-17 – 2020-05-19 (×2): 30 mL via ORAL
  Filled 2020-05-14 (×2): qty 30

## 2020-05-14 MED ORDER — ACETAMINOPHEN 500 MG PO TABS
1000.0000 mg | ORAL_TABLET | Freq: Once | ORAL | Status: AC
  Administered 2020-05-14: 1000 mg via ORAL

## 2020-05-14 MED ORDER — HYDROXYZINE HCL 50 MG PO TABS
50.0000 mg | ORAL_TABLET | Freq: Three times a day (TID) | ORAL | Status: DC | PRN
  Administered 2020-05-14 – 2020-05-20 (×13): 50 mg via ORAL
  Filled 2020-05-14 (×14): qty 1
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
## 2020-05-14 NOTE — ED Notes (Signed)
Report called and given to receiving nurse. Security called for escort to lower level BH. Clothing and belongings taken down stairs with patient.

## 2020-05-14 NOTE — Progress Notes (Signed)
Patient stated that she would take her Nicotine patch and Flonase later on.

## 2020-05-14 NOTE — Plan of Care (Signed)
New admission.  Problem: Education: Goal: Knowledge of [facility] Education information/materials will improve Outcome: Not Progressing Goal: Emotional status will improve Outcome: Not Progressing Goal: Mental status will improve Outcome: Not Progressing Goal: Verbalization of understanding the information provided will improve Outcome: Not Progressing   Problem: Safety: Goal: Periods of time without injury will increase Outcome: Not Progressing   Problem: Coping: Goal: Coping ability will improve Outcome: Not Progressing Goal: Will verbalize feelings Outcome: Not Progressing   Problem: Self-Concept: Goal: Level of anxiety will decrease Outcome: Not Progressing   Problem: Coping: Goal: Ability to identify and develop effective coping behavior will improve Outcome: Not Progressing
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
## 2020-05-14 NOTE — ED Notes (Signed)
Pt provided with juice and snack.
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
## 2020-05-14 NOTE — ED Notes (Signed)
Hourly rounding completed at this time, patient currently awake in room. No complaints, stable, and in no acute distress. Q15 minute rounds and monitoring via Rover and Officer to continue. °

## 2020-05-14 NOTE — ED Notes (Signed)
Pt again crying and yelling in room, pt now crying because other pt has glasses on and she does not. Pt very labile with mood. Pt yelling at this nurse while taking tylenol and collecting vitals, pt not allowing this nurse to speak and respond. Pt then asked to not speak so loud and pt claims she is not yelling and is crying and it does not matter what is going on in her room and states she does not care who hears her. This nurse then goes to exit room and pt gets up and comes toward nurse in an aggressive manor but stops self. Pt yelling continuously and attempts to slam door shut as nurse shutting door. As nurse is walking away from room pt yells, "fuck you man." and continues crying and complaining of care in ER.

## 2020-05-14 NOTE — ED Notes (Signed)
Hourly rounding completed at this time, patient currently asleep in room. No complaints, stable, and in no acute distress. Q15 minute rounds and monitoring via Rover and Officer to continue. 
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
## 2020-05-14 NOTE — ED Notes (Signed)
Snack was given to patient at this time 

## 2020-05-14 NOTE — ED Notes (Signed)
Pt provided with snack by new officer in quad. Will continue to monitor behavior
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
## 2020-05-14 NOTE — ED Notes (Signed)
Pt complains of hand pain, requests tylenol, Dr. Don Perking notified.

## 2020-05-14 NOTE — ED Notes (Signed)
Pt again in hallway yelling about how unfair rules are. Pt continues to yell and cry and calling staff "pieces of shit" along with other obscenities.

## 2020-05-14 NOTE — Tx Team (Signed)
Initial Treatment Plan 05/14/2020 2:41 PM Jorge Slauson VPX:[id number]    PATIENT STRESSORS: Financial difficulties Marital or family conflict Medication change or noncompliance   PATIENT STRENGTHS: Ability for insight Wellsite geologist fund of knowledge Motivation for treatment/growth   PATIENT IDENTIFIED PROBLEMS: Depression  Anxiety  Relationship issues  Unemployed               DISCHARGE CRITERIA:  Ability to meet basic life and health needs Improved stabilization in mood, thinking, and/or behavior Need for constant or close observation no longer present Reduction of life-threatening or endangering symptoms to within safe limits  PRELIMINARY DISCHARGE PLAN: Outpatient therapy Placement in alternative living arrangements  PATIENT/FAMILY INVOLVEMENT: This treatment plan has been presented to and reviewed with the patient, Kelly Carr. The patient has been given the opportunity to ask questions and make suggestions.  Jala Dundon, RN 05/14/2020, 2:41 PM

## 2020-05-14 NOTE — Progress Notes (Signed)
Recreation Therapy Notes  INPATIENT RECREATION THERAPY ASSESSMENT  Patient Details Name: Kelly Carr MRN: [id number] DOB: 1971/03/12 Today's Date: 05/14/2020       Information Obtained From: Patient  Able to Participate in Assessment/Interview: Yes  Patient Presentation: Responsive,Alert,Hyperverbal  Reason for Admission (Per Patient): Active Symptoms  Patient Stressors: Family,Relationship  Coping Skills:   Chiropodist (Comment) (Drink wine, Clean)  Leisure Interests (2+):  Sports - Dance,Music - Listen,Individual - Writing  Frequency of Recreation/Participation: Weekly  Awareness of Community Resources:  Yes  Community Resources:  Horticulturist, commercial (Comment) ([facility])  Current Use: Yes  If no, Barriers?:    Expressed Interest in [employer] Information:    [place] of Residence:  Film/video editor  Patient Main Form of Transportation: Set designer  Patient Strengths:  Arts development officer; Honest  Patient Identified Areas of Improvement:  Put me first  Patient Goal for Hospitalization:  To get healthy again.  Current SI (including self-harm):  No  Current HI:  No  Current AVH: No  Staff Intervention Plan: Group Attendance,Collaborate with Interdisciplinary Treatment Team  Consent to Intern Participation: N/A  Fardeen Steinberger 05/14/2020, 3:12 PM

## 2020-05-14 NOTE — ED Notes (Addendum)
Hourly rounding completed at this time, patient currently awake in room. No complaints, stable, and in no acute distress. Q15 minute rounds and monitoring via Rover and Officer to continue. °
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
## 2020-05-15 ENCOUNTER — Inpatient Hospital Stay: Payer: No Typology Code available for payment source

## 2020-05-15 MED ORDER — IBUPROFEN 600 MG PO TABS
800.0000 mg | ORAL_TABLET | Freq: Three times a day (TID) | ORAL | Status: DC | PRN
  Administered 2020-05-15 – 2020-05-20 (×12): 800 mg via ORAL
  Filled 2020-05-15 (×13): qty 1

## 2020-05-15 MED ORDER — LOPERAMIDE HCL 2 MG PO CAPS: 2.0000 mg | ORAL_CAPSULE | ORAL | Status: DC | PRN

## 2020-05-15 NOTE — Progress Notes (Signed)
Data: Patient is appropriate and cooperative to assessment. Patient denies SI/HI and AVH. Patient has complaints of anxiety and a pain rating of 10/10. Patient states pain is to bilateral wrists and headache. Patient presents with labile mood. Patient continues to interact with staff poorly. Patient spend majority of evening on phone.   Action:  Q x 15 minute observation checks were completed for safety. Patient was provided with education on medications. Patient was offered support and encouragement. Patient was given scheduled medications. Patient  was encourage to attend groups, participate in unit activities and continue with plan of care.     Response: Patient is not receptive to treatment. Safety maintained on unit with q-15 minute safety rounds.

## 2020-05-15 NOTE — Tx Team (Signed)
Interdisciplinary Treatment and Diagnostic Plan Update  05/15/2020 Time of Session: 9:00AM Kelly Carr MRN: [id number]  Principal Diagnosis: Bipolar 1 disorder, mixed, severe ([place])  Secondary Diagnoses: Principal Problem:   Bipolar 1 disorder, mixed, severe ([place]) Active Problems:   PTSD (post-traumatic stress disorder)   Cannabis abuse   Nicotine use disorder   Current Medications:  Current Facility-Administered Medications  Medication Dose Route Frequency Provider Last Rate Last Admin  . acetaminophen (TYLENOL) tablet 650 mg  650 mg Oral Q6H PRN [name], Madie Reno, MD   650 mg at 05/15/20 0408  . alum & mag hydroxide-simeth (MAALOX/MYLANTA) 200-200-20 MG/5ML suspension 30 mL  30 mL Oral Q4H PRN [name], [name], MD      . fluticasone (FLONASE) 50 MCG/ACT nasal spray 2 spray  2 spray Each Nare Daily [name], Madie Reno, MD   2 spray at 05/14/20 1448  . hydrOXYzine (ATARAX/VISTARIL) tablet 50 mg  50 mg Oral TID PRN [name], Madie Reno, MD   50 mg at 05/15/20 0522  . loratadine (CLARITIN) tablet 10 mg  10 mg Oral Daily [name], [name], MD   10 mg at 05/15/20 0800  . magnesium hydroxide (MILK OF MAGNESIA) suspension 30 mL  30 mL Oral Daily PRN [name], [name], MD      . nicotine (NICODERM CQ - dosed in mg/24 hr) patch 7 mg  7 mg Transdermal Daily Salley Scarlet, MD   7 mg at 05/15/20 0800  . Oxcarbazepine (TRILEPTAL) tablet 600 mg  600 mg Oral BID [name], Madie Reno, MD   600 mg at 05/15/20 0801  . ziprasidone (GEODON) capsule 20 mg  20 mg Oral Q6H PRN Salley Scarlet, MD      . ziprasidone (GEODON) injection 20 mg  20 mg Intramuscular Q8H PRN Salley Scarlet, MD       PTA Medications: Medications Prior to Admission  Medication Sig Dispense Refill Last Dose  . ARIPiprazole ER (ABILIFY MAINTENA) 400 MG SRER injection Inject 2 mLs (400 mg total) into the muscle every 28 (twenty-eight) days. 1 each 2   . busPIRone (BUSPAR) 15 MG tablet Take 1 tablet (15 mg total) by mouth 2 (two) times daily. 180  tablet 3   . cetirizine-pseudoephedrine (ZYRTEC-D) 5-120 MG tablet Take 1 tablet by mouth daily. 30 tablet 0   . fluticasone (FLONASE) 50 MCG/ACT nasal spray Place 2 sprays into both nostrils daily. 9.9 mL 2     Patient Stressors: Financial difficulties Marital or family conflict Medication change or noncompliance  Patient Strengths: Ability for insight Curator fund of knowledge Motivation for treatment/growth  Treatment Modalities: Medication Management, Group therapy, Case management,  1 to 1 session with clinician, Psychoeducation, Recreational therapy.   Physician Treatment Plan for Primary Diagnosis: Bipolar 1 disorder, mixed, severe ([place]) Long Term Goal(s): Improvement in symptoms so as ready for discharge Improvement in symptoms so as ready for discharge   Short Term Goals: Ability to identify changes in lifestyle to reduce recurrence of condition will improve Ability to verbalize feelings will improve Ability to disclose and discuss suicidal ideas Ability to demonstrate self-control will improve Ability to identify and develop effective coping behaviors will improve Compliance with prescribed medications will improve Ability to identify triggers associated with substance abuse/mental health issues will improve Ability to identify changes in lifestyle to reduce recurrence of condition will improve Ability to verbalize feelings will improve Ability to disclose and discuss suicidal ideas Ability to demonstrate self-control will improve Ability to identify and  develop effective coping behaviors will improve Compliance with prescribed medications will improve Ability to identify triggers associated with substance abuse/mental health issues will improve  Medication Management: Evaluate patient's response, side effects, and tolerance of medication regimen.  Therapeutic Interventions: 1 to 1 sessions, Unit Group sessions and Medication  administration.  Evaluation of Outcomes: Not Met  Physician Treatment Plan for Secondary Diagnosis: Principal Problem:   Bipolar 1 disorder, mixed, severe (HCC) Active Problems:   PTSD (post-traumatic stress disorder)   Cannabis abuse   Nicotine use disorder  Long Term Goal(s): Improvement in symptoms so as ready for discharge Improvement in symptoms so as ready for discharge   Short Term Goals: Ability to identify changes in lifestyle to reduce recurrence of condition will improve Ability to verbalize feelings will improve Ability to disclose and discuss suicidal ideas Ability to demonstrate self-control will improve Ability to identify and develop effective coping behaviors will improve Compliance with prescribed medications will improve Ability to identify triggers associated with substance abuse/mental health issues will improve Ability to identify changes in lifestyle to reduce recurrence of condition will improve Ability to verbalize feelings will improve Ability to disclose and discuss suicidal ideas Ability to demonstrate self-control will improve Ability to identify and develop effective coping behaviors will improve Compliance with prescribed medications will improve Ability to identify triggers associated with substance abuse/mental health issues will improve     Medication Management: Evaluate patient's response, side effects, and tolerance of medication regimen.  Therapeutic Interventions: 1 to 1 sessions, Unit Group sessions and Medication administration.  Evaluation of Outcomes: Not Met   RN Treatment Plan for Primary Diagnosis: Bipolar 1 disorder, mixed, severe ([place]) Long Term Goal(s): Knowledge of disease and therapeutic regimen to maintain health will improve  Short Term Goals: Ability to remain free from injury will improve, Ability to verbalize frustration and anger appropriately will improve, Ability to demonstrate self-control, Ability to participate in  decision making will improve, Ability to verbalize feelings will improve, Ability to disclose and discuss suicidal ideas, Ability to identify and develop effective coping behaviors will improve and Compliance with prescribed medications will improve  Medication Management: RN will administer medications as ordered by provider, will assess and evaluate patient's response and provide education to patient for prescribed medication. RN will report any adverse and/or side effects to prescribing provider.  Therapeutic Interventions: 1 on 1 counseling sessions, Psychoeducation, Medication administration, Evaluate responses to treatment, Monitor vital signs and CBGs as ordered, Perform/monitor CIWA, COWS, AIMS and Fall Risk screenings as ordered, Perform wound care treatments as ordered.  Evaluation of Outcomes: Not Met   LCSW Treatment Plan for Primary Diagnosis: Bipolar 1 disorder, mixed, severe ([place]) Long Term Goal(s): Safe transition to appropriate next level of care at discharge, Engage patient in therapeutic group addressing interpersonal concerns.  Short Term Goals: Engage patient in aftercare planning with referrals and resources, Increase social support, Increase ability to appropriately verbalize feelings, Increase emotional regulation, Facilitate acceptance of mental health diagnosis and concerns, Identify triggers associated with mental health/substance abuse issues and Increase skills for wellness and recovery  Therapeutic Interventions: Assess for all discharge needs, 1 to 1 time with Social worker, Explore available resources and support systems, Assess for adequacy in community support network, Educate family and significant other(s) on suicide prevention, Complete Psychosocial Assessment, Interpersonal group therapy.  Evaluation of Outcomes: Not Met   Progress in Treatment: Attending groups: No. Participating in groups: No. Taking medication as prescribed: Yes. Toleration medication:  Yes. Family/Significant other contact made: No,  will contact:  when given consent by patient. Patient understands diagnosis: Yes. Discussing patient identified problems/goals with staff: Yes. Medical problems stabilized or resolved: Yes. Denies suicidal/homicidal ideation: Yes. Issues/concerns per patient self-inventory: No. Other: None.  New problem(s) identified: No, Describe:  None.  New Short Term/Long Term Goal(s): medication management for mood stabilization; elimination of HI/SI thoughts; development of comprehensive mental wellness/sobriety plan.  Patient Goals: To work on "myself."   Discharge Plan or Barriers: CSW will assist patient in development of aftercare plans as appropriate.  Reason for Continuation of Hospitalization: Aggression Anxiety Homicidal ideation [place] Medication stabilization  Estimated Length of Stay: 1-7 days  Attendees: Patient: Kelly Carr 05/15/2020 10:47 AM  Physician: Selina Cooley, MD 05/15/2020 10:47 AM  Nursing: Vivianne Spence, RN 05/15/2020 10:47 AM  RN Care Manager: 05/15/2020 10:47 AM  Social Worker: Chalmers Guest. Guerry Bruin, MSW, [place], LCAS 05/15/2020 10:47 AM  Recreational Therapist: Devin Going, LRT  05/15/2020 10:47 AM  Other: Assunta Curtis, MSW, LCSW 05/15/2020 10:47 AM  Other:  05/15/2020 10:47 AM  Other: 05/15/2020 10:47 AM    Scribe for Treatment Team: Shirl Harris, LCSW 05/15/2020 10:47 AM
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
## 2020-05-15 NOTE — Plan of Care (Signed)
  Problem: Education: Goal: Knowledge of [facility] Education information/materials will improve Outcome: Not Progressing Goal: Emotional status will improve Outcome: Not Progressing   

## 2020-05-15 NOTE — Progress Notes (Signed)
Pt is alert and oriented to person, place, time and situation. Pt denies suicidal and homicidal ideation, reports depression and anxiety. Pt has been easily angry and irritable, hostile, yells profanity at various staff members unprovoked, later returns and apologizes. 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 Pt is noted to be on the phone stating, "This day shift crew today is an absolute nightmare, I can't wait until the next shift." Pt's mood is labile, tearful one moment, reporting to nurses the MHT, "made me come ask my nurse for ginger ale and she wouldn't just give it to me," stating she had reported nausea and the tech didn't want to give her the ginger ale. Later pt was overheard telling that tech, "the nurses here aren't in the right profession." Pt spends time on the phone talking, spending time watching tv and talking to peers in the dayroom. Pt banged on the nurses station door demanding and yelled, "Who has a key to the cabinet in the dayroom?!, I need a key right now because I need a spoon! "And, then told the charge RN, "I heard everything you said and you are making people to cry." Reality orientation and limit setting, setting appropriate boundaries with pt has been required to manage patient's behaviors during this shift, frequent redirections also required. Will continue to monitor pt per Q15 minute face checks and monitor for safety and progress.

## 2020-05-15 NOTE — Plan of Care (Signed)
  Problem: Education: Goal: Knowledge of [facility] Education information/materials will improve Outcome: Progressing Goal: Emotional status will improve Outcome: Not Progressing Goal: Mental status will improve Outcome: Not Progressing   Problem: Safety: Goal: Periods of time without injury will increase Outcome: Progressing

## 2020-05-15 NOTE — Progress Notes (Signed)
Recreation Therapy Notes   Date: 05/15/2020  Time: 9:30 am   Location: Craft room  Behavioral response: Appropriate  Intervention Topic: Decision Making    Discussion/Intervention:  Group content today was focused on Decision making. The group defined decision making and some positive ways they make decisions for themselves. Individuals expressed reasons why they neglected any decision making in the past. Patients described ways to improve decision making skills in the future. The group explained what could happen if they did not do any decision making at all. Participants express how bad decision has affected them and others around them. Individual explained the importance of decision making. The group participated in the intervention "Making decisions" where they had a chance to discover some of their weaknesses and strengths in decision making. Patient came up with a new decision-making skill to improve themselves in the future.  Clinical Observations/Feedback: Patient came to group and explained that she needs to work on her decision-making skills to become a better person. She expressed that to better herself she would like to go back to school, work and be alone for a while. Participant explained that she could make better decisions by not being around toxic people and not trying to change people. Patient stated that her focus now is herself and making better decisions to have a better future. Individual was social with staff while participating in the intervention.  Kelly Carr LRT/CTRS         Kelly Carr 05/15/2020 12:51 PM
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
## 2020-05-15 NOTE — [facility] Suicide Risk Assessment (Addendum)
[facility] INPATIENT:  Family/Significant Other Suicide Prevention Education  Suicide Prevention Education:  Patient Refusal for Family/Significant Other Suicide Prevention Education: The patient Kelly Carr has refused to provide written consent for family/significant other to be provided Family/Significant Other Suicide Prevention Education during admission and/or prior to discharge.  Physician notified. SPE completed with patient.    Corky Crafts 05/15/2020, 1:52 PM
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
## 2020-05-16 MED ORDER — ONDANSETRON HCL 4 MG PO TABS
4.0000 mg | ORAL_TABLET | Freq: Once | ORAL | Status: AC
  Administered 2020-05-16: 4 mg via ORAL
  Filled 2020-05-16: qty 1

## 2020-05-16 NOTE — Progress Notes (Signed)
Pt is alert and oriented to person, place, time and situation. Pt is calm, cooperative, denies suicidal and homicidal ideation, reports anxiety and depression, reports she went to groups and is triggered by talking about her past trauma, then became nauseous was given ginger ale and zofran PO. Pt is social, intrusive and attention seeking at times. Will continue to monitor pt per Q15 minute face checks and monitor for safety and progress.
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
## 2020-05-16 NOTE — [facility] Group Notes (Signed)
Patient attended the NA/AA group

## 2020-05-16 NOTE — [facility] Group Notes (Signed)
  [facility] LCSW Group Therapy Note  Date/Time:  05/16/2020 1:21 PM- 2:10 PM  Type of Therapy and Topic:  Group Therapy:  Feelings About Hospitalization  Participation Level:  Active   Description of Group This process group involved patients discussing their feelings related to being hospitalized, as well as the benefits they see to being in the hospital.  These feelings and benefits were itemized.  The group then brainstormed specific ways in which they could seek those same benefits when they discharge and return home.  Therapeutic Goals 1. Patient will identify and describe positive and negative feelings related to hospitalization 2. Patient will verbalize benefits of hospitalization to themselves personally 3. Patients will brainstorm together ways they can obtain similar benefits in the outpatient setting, identify barriers to wellness and possible solutions  Summary of Patient Progress:  Patient checked into group feeling a lot better than she was when she came in. Patient stated she is eating, sleeping, and now feels safe. Patient stated a negative for her was when she came in mad and was cussing at staff. Patient stated it was scary and a culture shock. Patient stated that the hospital is keeping her out of trouble.   Therapeutic Modalities Cognitive Behavioral Therapy Motivational Interviewing    Susa Simmonds, [place] 05/16/2020  3:17 PM
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
## 2020-05-17 MED ORDER — TRAZODONE HCL 100 MG PO TABS
100.0000 mg | ORAL_TABLET | Freq: Every evening | ORAL | Status: DC | PRN
  Administered 2020-05-18 – 2020-05-19 (×2): 100 mg via ORAL
  Filled 2020-05-17 (×2): qty 1

## 2020-05-17 NOTE — [facility] Group Notes (Signed)
   LCSW Group Therapy Note    04/25/2020: 1:15 PM- 2:15 PM     Type of Therapy and Topic:  Group Therapy:  Overcoming Obstacles     Participation Level:  Active     Description of Group:     In this group patients will be encouraged to explore what they see as obstacles to their own wellness and recovery. They will be guided to discuss their thoughts, feelings, and behaviors related to these obstacles. The group will process together ways to cope with barriers, with attention given to specific choices patients can make. Each patient will be challenged to identify changes they are motivated to make in order to overcome their obstacles. This group will be process-oriented, with patients participating in exploration of their own experiences as well as giving and receiving support and challenge from other group members.     Therapeutic Goals:  1.    Patient will identify personal and current obstacles as they relate to admission.  2.    Patient will identify barriers that currently interfere with their wellness or overcoming obstacles.  3.    Patient will identify feelings, thought process and behaviors related to these barriers.  4.    Patient will identify two changes they are willing to make to overcome these obstacles:        Summary of Patient Progress: Patient checked into group feeling better and excited about her future. Patient stated she is struggling with trusting people and setting boundaries. Patient stated that she has a fear of opening back up to people. Patient was crying during group and was able to give advice to the other patients. Patient stated her biggest change is putting herself first.          Therapeutic Modalities:    Cognitive Behavioral Therapy  Solution Focused Therapy  Motivational Interviewing  Relapse Prevention Therapy    Susa Simmonds, LCSWA   05/17/2020
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
## 2020-05-17 NOTE — Plan of Care (Signed)
  Problem: Education: Goal: Knowledge of Kelly Carr General Education information/materials will improve Outcome: Progressing Goal: Emotional status will improve Outcome: Progressing Goal: Mental status will improve Outcome: Progressing Goal: Verbalization of understanding the information provided will improve Outcome: Progressing   Problem: Safety: Goal: Periods of time without injury will increase Outcome: Progressing   Problem: Coping: Goal: Coping ability will improve Outcome: Progressing Goal: Will verbalize feelings Outcome: Progressing   Problem: Self-Concept: Goal: Level of anxiety will decrease Outcome: Progressing   Problem: Coping: Goal: Ability to identify and develop effective coping behavior will improve Outcome: Progressing
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
## 2020-05-17 NOTE — Progress Notes (Signed)
   05/17/20 1150  Clinical Encounter Type  Visited With Patient  Visit Type Initial;Spiritual support;Social support  Referral From Nurse  Consult/Referral To Chaplain  Responded to [facility] per OR. Pt was happy to see me. Pt told me about her problems and asked for prayer. I prayed for Pt and will follow up later.

## 2020-05-17 NOTE — Progress Notes (Signed)
Pt is alert and oriented to person, place, time and situation. Pt is calm, cooperative, at times childlike and attention seeking. Pt is social with peers and watches tv, is medication compliant. Pt denies suicidal and homicidal ideation, denies hallucinations, denies feelings of depression, reports anxiety. No distress noted, none reported. Pt's affect is bright, smiles often, is hyperverbal at times, repeats many of the same stories repeatedly to various staff members and even the same story to the same staff members. Pt at times is tangential and somewhat disorganized. No distress noted, none reported. Will continue to monitor pt per Q15 minute face checks and monitor for safety and progress.
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
## 2020-05-17 NOTE — Plan of Care (Signed)
  Problem: Education: Goal: Knowledge of [facility] Education information/materials will improve Outcome: Not Progressing Goal: Emotional status will improve Outcome: Not Progressing Goal: Mental status will improve Outcome: Not Progressing Goal: Verbalization of understanding the information provided will improve Outcome: Not Progressing   Problem: Safety: Goal: Periods of time without injury will increase Outcome: Not Progressing   Problem: Coping: Goal: Coping ability will improve Outcome: Not Progressing Goal: Will verbalize feelings Outcome: Not Progressing   Problem: Self-Concept: Goal: Level of anxiety will decrease Outcome: Not Progressing   Problem: Coping: Goal: Ability to identify and develop effective coping behavior will improve Outcome: Not Progressing

## 2020-05-17 NOTE — Progress Notes (Signed)
Patient is complaining that she cannot have her ibuprofen but when I offered her tylenol, she refused and said "I don't need it, I'm going to sit up all night and wait for my ibuprofen"

## 2020-05-18 MED ORDER — OXCARBAZEPINE 300 MG PO TABS
750.0000 mg | ORAL_TABLET | Freq: Two times a day (BID) | ORAL | Status: DC
  Filled 2020-05-18 (×3): qty 1

## 2020-05-18 MED ORDER — OXCARBAZEPINE 150 MG PO TABS
750.0000 mg | ORAL_TABLET | Freq: Two times a day (BID) | ORAL | Status: DC
  Administered 2020-05-18 – 2020-05-20 (×4): 750 mg via ORAL
  Filled 2020-05-18 (×6): qty 5

## 2020-05-18 MED ORDER — ARIPIPRAZOLE ER 400 MG IM SRER
400.0000 mg | INTRAMUSCULAR | Status: DC
  Administered 2020-05-18: 400 mg via INTRAMUSCULAR
  Filled 2020-05-18: qty 2

## 2020-05-18 NOTE — Progress Notes (Addendum)
[facility] MD Progress Note  05/18/2020 10:08 AM Kelly Carr  MRN:  [id number]   Principal Problem: Bipolar 1 disorder, mixed, severe (HCC) Diagnosis: Principal Problem:   Bipolar 1 disorder, mixed, severe (HCC) Active Problems:   PTSD (post-traumatic stress disorder)   Cannabis abuse   Nicotine use disorder  Kelly Carr is a 49 y.o. female who presents to the [facility] unit for treatment of bipolar disorder in the context of acute manic episode.   Interval History Patient was seen today for re-evaluation.  Nursing reports patient intrusive overnight. She was escalating about feeling staff was abusive, and refusing medications overnight. The patient has no issues with performing ADLs.  Patient has been medication compliant today.    Subjective:  On assessment patient reports she had a rough night. She felt that staff were abusive towards her for not letting her speak to her boyfriend after phone hours completed. She also reports they were trying to give her Tylenol when asking for ibuprofen. She is unwilling to listen to explanation of ibuprofen being q8 hours, and they were offering an alternative inbetween. Speech is pressured today, and she is unable to be interrupted. She is very irritable and agitated on exam. Thought process is tangential, disorganized, and difficult to follow. She is still planning to go to a womens shelter and follow with her outpatient psychiatrist. She denies having suicidal  thoughts, plans. She denies auditory/visual hallucinations. She endorses passive homicidal ideations toward her boyfriend without plan. The patient reports no side effects from medications.   Labs: no new results for review.  Total Time spent with patient: 45 minutes  Past Psychiatric History: Multiple prior admission, most recently in [place] 2021. Typically does well with Abilify LAI and mood stabilizer. Most recently stabilized with Trileptal. History of suicide attempts, aggression, and bizarre  behavior   Past Medical History:  Past Medical History:  Diagnosis Date  . Abnormal Pap smear    Unknown results>colpo>normal  . Anxiety   . Arthritis   . Asthma   . Bipolar 1 disorder (HCC)   . Depression     Past Surgical History:  Procedure Laterality Date  . NO PAST SURGERIES     Family History:  Family History  Problem Relation Age of Onset  . Diabetes Mother   . Hypertension Mother   . Heart disease Mother 72  . Schizophrenia Mother   . Diabetes Maternal Grandmother   . Heart disease Maternal Grandmother   . Diabetes Maternal Grandfather   . Heart disease Maternal Grandfather   . Bipolar disorder Cousin   . Bipolar disorder Nephew   . Depression Daughter    Family Psychiatric  History: Positive for bipolar disorder in her immediate family  Social History:  Social History   Substance and Sexual Activity  Alcohol Use Not Currently     Social History   Substance and Sexual Activity  Drug Use Not Currently  . Types: Marijuana   Comment: rare pot use    Social History   Socioeconomic History  . Marital status: Single    Spouse name: Not on file  . Number of children: Not on file  . Years of education: Not on file  . Highest education level: Not on file  Occupational History  . Not on file  Tobacco Use  . Smoking status: Current Some Day Smoker    Packs/day: 0.10    Types: E-cigarettes  . Smokeless tobacco: Never Used  Vaping Use  . Vaping Use: Every day  Substance and Sexual Activity  . Alcohol use: Not Currently  . Drug use: Not Currently    Types: Marijuana    Comment: rare pot use  . Sexual activity: Yes    Partners: Male    Birth control/protection: None  Other Topics Concern  . Not on file  Social History Narrative  . Not on file   Social Determinants of Health   Financial Resource Strain: Not on file  Food Insecurity: Not on file  Transportation Needs: Not on file  Physical Activity: Not on file  Stress: Not on file  Social  Connections: Not on file   Additional Social History:                         Sleep: Fair  Appetite:  Fair  Current Medications: Current Facility-Administered Medications  Medication Dose Route Frequency Provider Last Rate Last Admin  . alum & mag hydroxide-simeth (MAALOX/MYLANTA) 200-200-20 MG/5ML suspension 30 mL  30 mL Oral Q4H PRN [name], [name], MD      . ARIPiprazole ER (ABILIFY MAINTENA) injection 400 mg  400 mg Intramuscular Q28 days Jesse SansFreeman, Chavon Lucarelli M, MD      . fluticasone [facility](FLONASE) 50 MCG/ACT nasal spray 2 spray  2 spray Each Nare Daily [name], [name], MD   2 spray at 05/14/20 1448  . hydrOXYzine (ATARAX/VISTARIL) tablet 50 mg  50 mg Oral TID PRN [name], [name], MD   50 mg at 05/16/20 2112  . ibuprofen (ADVIL) tablet 800 mg  800 mg Oral TID PRN Jesse SansFreeman, Catelynn Sparger M, MD   800 mg at 05/18/20 [id number]  . loperamide (IMODIUM) capsule 2 mg  2 mg Oral Q4H PRN Jesse SansFreeman, Claudette Wermuth M, MD      . loratadine (CLARITIN) tablet 10 mg  10 mg Oral Daily [name], [name], MD   10 mg at 05/18/20 0802  . magnesium hydroxide (MILK OF MAGNESIA) suspension 30 mL  30 mL Oral Daily PRN [name], [name], MD   30 mL at 05/17/20 1011  . nicotine (NICODERM CQ - dosed in mg/24 hr) patch 7 mg  7 mg Transdermal Daily Jesse SansFreeman, Chynah Orihuela M, MD   7 mg at 05/18/20 0802  . Oxcarbazepine (TRILEPTAL) tablet 600 mg  600 mg Oral BID [name], [name], MD   600 mg at 05/18/20 0802  . traZODone (DESYREL) tablet 100 mg  100 mg Oral QHS PRN Dixon, Rashaun M, NP      . ziprasidone (GEODON) capsule 20 mg  20 mg Oral Q6H PRN Jesse SansFreeman, Brantley Wiley M, MD   20 mg at 05/17/20 0406  . ziprasidone (GEODON) injection 20 mg  20 mg Intramuscular Q8H PRN Jesse SansFreeman, Steve Youngberg M, MD        Lab Results: No results found for this or any previous visit (from the past 48 hour(s)).  Blood Alcohol level:  Lab Results  Component Value Date   ETH <10 05/13/2020   ETH <10 08/09/2019    Metabolic Disorder Labs: Lab Results  Component Value Date   HGBA1C 5.3  05/13/2020   MPG 105.41 05/13/2020   MPG 102.54 08/09/2019   Lab Results  Component Value Date   PROLACTIN 39.5 (H) 04/12/2018   PROLACTIN 2.0 12/05/2011   Lab Results  Component Value Date   CHOL 207 (H) 05/13/2020   TRIG 56 05/13/2020   HDL 61 05/13/2020   CHOLHDL 3.4 05/13/2020   VLDL 11 05/13/2020   LDLCALC 135 (H) 05/13/2020   LDLCALC  136 (H) 08/09/2019    Physical Findings: AIMS:  , ,  ,  ,    CIWA:    COWS:     Musculoskeletal: Strength & Muscle Tone: within normal limits Gait & Station: normal Patient leans: N/A  Psychiatric Specialty Exam: Physical Exam Vitals and nursing note reviewed.  Constitutional:      Appearance: Normal appearance.  HENT:     Head: Normocephalic and atraumatic.     Right Ear: External ear normal.     Left Ear: External ear normal.     Nose: Nose normal.     Mouth/Throat:     Mouth: Mucous membranes are moist.     Pharynx: Oropharynx is clear.  Eyes:     Extraocular Movements: Extraocular movements intact.     Conjunctiva/sclera: Conjunctivae normal.     Pupils: Pupils are equal, round, and reactive to light.  Cardiovascular:     Rate and Rhythm: Normal rate.     Pulses: Normal pulses.  Pulmonary:     Effort: Pulmonary effort is normal.     Breath sounds: Normal breath sounds.  Abdominal:     General: Abdomen is flat.     Palpations: Abdomen is soft.  Musculoskeletal:        General: No swelling. Normal range of motion.     Cervical back: Normal range of motion and neck supple.  Skin:    General: Skin is warm and dry.  Neurological:     General: No focal deficit present.     Mental Status: She is alert and oriented to person, place, and time.  Psychiatric:        Attention and Perception: Perception normal. She is inattentive.        Mood and Affect: Mood is anxious. Affect is labile.        Speech: Speech is rapid and pressured.        Behavior: Behavior is hyperactive.        Thought Content: Thought content  includes homicidal ideation.        Cognition and Memory: Cognition and memory normal.        Judgment: Judgment is impulsive.     Review of Systems  Constitutional: Positive for activity change. Negative for fatigue.  HENT: Negative for rhinorrhea and sore throat.   Eyes: Negative for photophobia and visual disturbance.  Respiratory: Negative for cough and shortness of breath.   Cardiovascular: Negative for chest pain and palpitations.  Gastrointestinal: Negative for constipation, diarrhea, nausea and vomiting.  Endocrine: Negative for cold intolerance and heat intolerance.  Genitourinary: Negative for difficulty urinating and dysuria.  Musculoskeletal: Negative for arthralgias and myalgias.  Skin: Negative for rash.  Allergic/Immunologic: Negative for food allergies and immunocompromised state.  Neurological: Negative for dizziness and headaches.  Hematological: Negative for adenopathy. Does not bruise/bleed easily.  Psychiatric/Behavioral: Positive for agitation, behavioral problems, decreased concentration and sleep disturbance. Negative for hallucinations. The patient is hyperactive.     Vitals:   05/17/20 1014 05/18/20 0620  BP:  (!) 142/79  Pulse: (!) 105 86  Resp:  14  Temp:  98.1 F (36.7 C)  SpO2: 99% 99%   spO2 12/12 0608 - incorrect measurement   General Appearance: Casual and Well Groomed  Eye Contact:  Good  Speech:  Pressured  Volume:  Normal  Mood:  Irritable  Affect:  Congruent  Thought Process:  Disorganized  Orientation:  Full (Time, Place, and Person)  Thought Content:  Tangential  Suicidal Thoughts:  No  Homicidal Thoughts:  Yes.  without intent/plan  Memory:  Immediate;   Fair Recent;   Fair Remote;   Fair  Judgement:  Other:  limited, improming  Insight:  limited  Psychomotor Activity:  restless  Concentration:  Concentration: Fair and Attention Span: Fair  Recall:  [employer] of Knowledge:  Fair  Language:  Fair  Akathisia:  No  Handed:   Right  AIMS (if indicated):     Assets:  Desire for Improvement Physical Health Resilience  ADL's:  Intact  Cognition:  WNL  Sleep:  Number of Hours: 4     Treatment Plan Summary: Daily contact with patient to assess and evaluate symptoms and progress in treatment and Medication management   Patient is a 49 year old female with the above-stated past psychiatric history who is seen in follow-up.  Chart reviewed. Patient discussed with nursing. Patient continues to present withhyperactivity and fast speech. Patient received Abilify maintenna LAI on 04/22/20, and will give injection today.Increase Trileptal to 750 mg BID.    Plan:  -continue inpatient psych admission; 15-minute checks; daily contact with patient to assess and evaluate symptoms and progress in treatment; psychoeducation.  -continue scheduled medications: . ARIPiprazole ER  400 mg Intramuscular Q28 days  . fluticasone  2 spray Each Nare Daily  . loratadine  10 mg Oral Daily  . nicotine  7 mg Transdermal Daily  . OXcarbazepine  600 mg Oral BID    -continue PRN medications.  alum & mag hydroxide-simeth, hydrOXYzine, ibuprofen, loperamide, magnesium hydroxide, traZODone, ziprasidone, ziprasidone  -Pertinent Labs: no new labs ordered today  -EKG: on 12/8 showed Qtc with normal sinus rhythm    -Consults: No new consults placed since yesterday    -Disposition: Social worker to help patient get into housing versus shelter. All necessary aftercare will be arranged prior to discharge Likely d/c home late this week with outpatient psych follow-up.  -  I certify that the patient does need, on a daily basis, active treatment furnished directly by or requiring the supervision of inpatient psychiatric facility personnel.   [name], MD 05/18/2020, 10:08 AM

## 2020-05-18 NOTE — Progress Notes (Signed)
Patient received Ibuprofen for reported pain. Tolerated  medication without incident. Remains safe on the unit with 15 minute safety checks and encouraged to come to staff with any concerns.      Kelly Butler-Nicholson, LPN
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
## 2020-05-18 NOTE — Plan of Care (Signed)
Patient states this morning " I was doing good with my anxiety and depression but some staff ruined my day.They were discriminating me." Patient is hyper verbal, tearful and disorganized.Patient is not receptive with staff.Denies SI,HI and AVH.PRN medications given as per request.Attended groups.Appetite and energy level good.Support and encouragement given.

## 2020-05-18 NOTE — BHH Counselor (Signed)
Patient requested to meet with patient after group. Patient stated that she had a question and asked for information on the IVC process.  CSW attempted to explain the IVC process to her.  Patient then began to ruminate on the pitfalls of the weekend and advocating for other patients.  CSW thanked patient for advocating for others, however, encouraged patient to focus on her wellbeing and meeting her goals.  Patient became upset at this and walked away.  Patient later approached the CSW at the nurses station and stated "Just so you know I don't think that I am a doctor."  Patient became argumentative and CSW removed herself from the situation to prevent further escalation.    Penni Homans, MSW, LCSW 05/18/2020 2:43 PM

## 2020-05-18 NOTE — BHH Counselor (Addendum)
CSW contacted Kelly Carr with [facility] at the request of the Patient related to the her final anger management session. Patient provided written consent. Kelly Carr was not reached. CSW left voicemail with callback request, no health information was given on voicemail.   Gwenevere Ghazi, MSW, [place], Bridget Hartshorn 05/18/2020 12:38 PM

## 2020-05-18 NOTE — Progress Notes (Signed)
Patient presents with manic stated, hype-verbal, pressured speech. Patient ruminates on the same story and continually tells staff. Patient had a crying spell and was given PRN medications (see MAR). Patient given education, support, and encouragement to be active in her treatment plan. Patient intrusive with staff, child-like, and needy. Patient redirected several times. Patient being monitored Q 15 minutes for safety per unit protocol. Patient remains safe on the unit.

## 2020-05-18 NOTE — Progress Notes (Signed)
Recreation Therapy Notes  Date: 05/18/2020  Time: 9:30 am   Location: Craft room  Behavioral response: Appropriate  Intervention Topic: Necessities     Discussion/Intervention:  Group content on today was focused on necessities. The group defined necessities and how they determine their necessities. Individuals expressed how many necessities they have and if it changes from day to day. Patients described the difference between wants and needs. The group explained how they have overspent on wants in the past. Individuals described a reoccurring necessity for them. The intervention "What I need" helped patients differentiate between wants and needs.  Clinical Observations/Feedback: Patient came to group and defined necessities as things you need to survive. She identified her necessities as a peace of mind, a home, serenity and God. She expressed that necessities are important to function. Individual was social with staff while participating in the intervention.  Deloris Mittag LRT/CTRS         Cobe Viney 05/18/2020 1:45 PM

## 2020-05-18 NOTE — Plan of Care (Signed)
Patient presents manic, with pressured speech, intrusive, attention-seeking   Problem: Education: Goal: Emotional status will improve Outcome: Not Progressing Goal: Mental status will improve Outcome: Not Progressing

## 2020-05-18 NOTE — BHH Counselor (Signed)
CSW received call back from patient's court ordered anger management counselor Mellody Drown [phone] Patient requested CSW contact their counselor to inform them of inpatient stay, see note dated 05/18/20 @ 1240. Counselor informed CSW the patient needs to contact their attorney Jeneen Rinks [phone]) to prevent legal action for outstanding anger management session. CSW provided information to patient. During this interaction patient became argumentative refusing to take the written contact information, stating the contact information was not correct or from a prior conviction. Patient eventually took the written contact information despite argument. CSW encouraged Patient to call the attorney to prevent legal action. Patients tone was rapid and elevated, thoughts were disorganized.   Corky Crafts, MSW, [place], LCASA 05/18/2020 4:10 PM

## 2020-05-19 ENCOUNTER — Other Ambulatory Visit: Payer: Self-pay | Admitting: Behavioral Health

## 2020-05-19 MED ORDER — HYDROXYZINE HCL 50 MG PO TABS
50.0000 mg | ORAL_TABLET | Freq: Three times a day (TID) | ORAL | 0 refills | Status: DC | PRN
Start: 2020-05-19 — End: 2020-05-20

## 2020-05-19 MED ORDER — NAPHAZOLINE-GLYCERIN 0.012-0.2 % OP SOLN
1.0000 [drp] | Freq: Four times a day (QID) | OPHTHALMIC | Status: DC | PRN
  Administered 2020-05-19 – 2020-05-20 (×2): 2 [drp] via OPHTHALMIC
  Filled 2020-05-19: qty 15

## 2020-05-19 MED ORDER — TRAZODONE HCL 100 MG PO TABS: 100.0000 mg | ORAL_TABLET | Freq: Every evening | ORAL | 0 refills | Status: DC | PRN

## 2020-05-19 MED ORDER — OXCARBAZEPINE 300 MG PO TABS: 750.0000 mg | ORAL_TABLET | Freq: Two times a day (BID) | ORAL | 0 refills | Status: DC

## 2020-05-19 NOTE — Progress Notes (Signed)
D: Pt alert and oriented. Pt rates depression 0/10, hopelessness 0/10, and anxiety 5/10. Pt goal: "To focus on being out before Christmas." Pt reports energy level as normal and concentration as being good. Pt reports sleep last night as being good. Pt did not receive medications for sleep. Pt reports experiencing 5/10 bilat wrist pain, prn meds refused at this time pt states she will let me know when she wants it. Pt denies experiencing any SI/HI, or AVH at this time.   Pt has been calm/cooperative and less attention seeking/needed today.   A: Scheduled medications administered to pt, per MD orders. Support and encouragement provided. Frequent verbal contact made. Routine safety checks conducted q15 minutes.   R: No adverse drug reactions noted. Pt verbally contracts for safety at this time. Pt complaint with medications and treatment plan. Pt interacts well with others on the unit. Pt remains safe at this time. Will continue to monitor.

## 2020-05-19 NOTE — Progress Notes (Signed)
Recreation Therapy Notes   Date: 05/19/2020  Time: 9:30 am   Location: Craft room  Behavioral response: Appropriate  Intervention Topic: Happiness   Discussion/Intervention:  Group content today was focused on Happiness. The group defined happiness and described where happiness comes from. Individuals identified what makes them happy and how they go about making others happy. Patients expressed things that stop them from being happy and ways they can improve their happiness. The group stated reasons why it is important to be happy. The group participated in the intervention "My Happiness", where they had a chance to identify and express things that make them happy. Clinical Observations/Feedback: Patient came to group and defined happiness as being comfortable and being at peace. She explained that getting along with people around and having her own space is what makes her happy. Participant expressed that people robbing her of her joy and certain triggers are the things that make her unhappy. Individual was social with peers and staff participating in the intervention.  Roopa Graver LRT/CTRS         Caley Volkert 05/19/2020 1:07 PM

## 2020-05-19 NOTE — BHH Group Notes (Signed)
LCSW Group Therapy Note  05/19/2020 2:34 PM  Type of Therapy/Topic:  Group Therapy:  Feelings about Diagnosis  Participation Level:  Active   Description of Group:   This group will allow patients to explore their thoughts and feelings about diagnoses they have received. Patients will be guided to explore their level of understanding and acceptance of these diagnoses. Facilitator will encourage patients to process their thoughts and feelings about the reactions of others to their diagnosis and will guide patients in identifying ways to discuss their diagnosis with significant others in their lives. This group will be process-oriented, with patients participating in exploration of their own experiences, giving and receiving support, and processing challenge from other group members.   Therapeutic Goals: 1. Patient will demonstrate understanding of diagnosis as evidenced by identifying two or more symptoms of the disorder 2. Patient will be able to express two feelings regarding the diagnosis 3. Patient will demonstrate their ability to communicate their needs through discussion and/or role play  Summary of Patient Progress: Patient was present for the entirety of the group session. Patient was an active listener and participated in the topic of discussion. Patient shared experiences related to her mental health diagnosis. Patient was often off topic, egocentric, and difficult to redirect. Patient reported that she has "lost people close to [her]because of her diagnosis" and that she has "isolated [her]self" because of her diagnosis.      Therapeutic Modalities:   Cognitive Behavioral Therapy Brief Therapy Feelings Identification   Gwenevere Ghazi, MSW, [place], Bridget Hartshorn 05/19/2020 2:34 PM
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
## 2020-05-19 NOTE — BHH Counselor (Signed)
CSW contacted Tanda/[facility] peer support [phone]) regarding patient discharge. Unable to establish contact and HIPPA complaint voicemail left with contact information for follow through.   Vilma Meckel. Algis Greenhouse, MSW, LCSW, LCAS 05/19/2020 3:03 PM

## 2020-05-19 NOTE — Progress Notes (Signed)
Patient presents with a better affect than this writer has seen her this admission. Patient preoccupied with other patients. Patient given education, support, and encouragement to be active in her treatment plan. Patient intrusive with staff, child-like, and needy. Patient redirected several times. Patient being monitored Q 15 minutes for safety per unit protocol. Patient remains safe on the unit.

## 2020-05-19 NOTE — BHH Counselor (Addendum)
CSW contacted Tanda/[facility] peer support [phone]) regarding patient admission. She inquired regarding patient's discharge plan. She was informed that they were uncertain at the moment but the pt has shared plans to go to a shelter in the [facility] area but without any specific names being given (as she does not want anyone to know where she is going). Alvy Beal informed CSW that they had staffed her case and came with recommendations that pt services be enhanced to CST or ACTT services. She stated that she has spoken with the pt and that it seemed she was planning to go to the [facility] area as well. Alvy Beal said if that remains the case then pt would just be referred to the [facility] in [facility] by them. CSW stated that she would be updated with any new developments. No other concerns expressed. Contact ended without incident.   Vilma Meckel. Algis Greenhouse, MSW, LCSW, LCAS 05/19/2020 11:01 AM

## 2020-05-19 NOTE — Plan of Care (Signed)
Patient presents less manic than when this writer had her previously on this admission   Problem: Education: Goal: Emotional status will improve Outcome: Progressing Goal: Mental status will improve Outcome: Progressing

## 2020-05-20 MED ORDER — OXCARBAZEPINE 300 MG PO TABS: 750.0000 mg | ORAL_TABLET | Freq: Two times a day (BID) | ORAL | 1 refills | Status: DC

## 2020-05-20 MED ORDER — HYDROXYZINE HCL 50 MG PO TABS: 50.0000 mg | ORAL_TABLET | Freq: Three times a day (TID) | ORAL | 1 refills | Status: DC | PRN

## 2020-05-20 MED ORDER — TRAZODONE HCL 100 MG PO TABS: 100.0000 mg | ORAL_TABLET | Freq: Every evening | ORAL | 1 refills | Status: DC | PRN

## 2020-05-20 MED ORDER — ARIPIPRAZOLE ER 400 MG IM SRER: 400.0000 mg | INTRAMUSCULAR | 3 refills | Status: DC

## 2020-05-20 NOTE — Progress Notes (Signed)
°  [facility] Adult Case Management Discharge Plan :  Will you be returning to the same living situation after discharge:  No. At discharge, do you have transportation home?: Yes,  CSW to arrange transportation to a drop off location where [facility] shelter will pickup patient.  Do you have the ability to pay for your medications: No.  Release of information consent forms completed and in the chart;  Patient's signature needed at discharge.  Patient to Follow up at:  Follow-up Information    [facility] Follow up.   Specialty: Behavioral Health Why: Present for appointment on Tuesday 10 June 2019 @ 0800, arrive by 0745 at the address provided.  Contact information: [address] [place] [address] [phone]       [facility] Follow up.   Why: Present for appointment on 1 February at 11AM with Dr. Claybon Jabs at the address provided.  Contact information: [address] [place] [address]              Next level of care provider has access to [facility] [employer] and Suicide Prevention discussed: Yes,  CSW completed SPE with patient.   Have you used any form of tobacco in the last 30 days? (Cigarettes, Smokeless Tobacco, Cigars, and/or Pipes): Yes  Has patient been referred to the Quitline?: Patient refused referral  Patient has been referred for addiction treatment: Pt. refused referral  Corky Crafts, LCSWA 05/20/2020, 10:15 AM

## 2020-05-20 NOTE — Progress Notes (Signed)
D: Pt alert and oriented. Pt rates depression 0/10, hopelessness 0/10, and anxiety 0/10. Pt goal: "Focusing on me staying calm breathing and walking away." Pt reports energy level as normal and concentration as being good. Pt reports sleep last night as being fair. Pt did receive medications for sleep and did find them helpful. Pt reports experiencing 4/10 bilat wrist pain, prn meds given. Pt denies experiencing any SI/HI, or AVH at this time.   Pt did have a verbal conflict this morning with another pt on the unit that was handled very quickly by the staff.  A: Scheduled medications administered to pt, per MD orders. Support and encouragement provided. Frequent verbal contact made. Routine safety checks conducted q15 minutes.   R: No adverse drug reactions noted. Pt verbally contracts for safety at this time. Pt complaint with medications and treatment plan. Pt interacts well with others on the unit. Pt remains safe at this time. Will continue to monitor.
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
## 2020-05-20 NOTE — Progress Notes (Signed)
D: Pt alert and oriented. Pt denies experiencing any pain, SI/HI, or AVH at this time. Pt reports she will be able to keep herself safe when she returns home.   A: Pt received discharge and medication education/information. Pt belongings were returned and signed for at this time to include printed prescriptions and medication supply.   R: Pt verbalized understanding of discharge and medication education/information.  Pt escorted by staff to the physician's on call parking lot where pt was picked up by safe transport and transported to a shelter.
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
## 2020-05-20 NOTE — Progress Notes (Signed)
Recreation Therapy Notes  INPATIENT RECREATION TR PLAN  Patient Details Name: Kelly Carr MRN: [id number] DOB: 07-27-1970 Today's Date: 05/20/2020  Rec Therapy Plan Is patient appropriate for Therapeutic Recreation?: Yes Treatment times per week: at least 3 Estimated Length of Stay: 5-7 days TR Treatment/Interventions: Group participation (Comment)  Discharge Criteria Treatment plan/goals/alternatives discussed and agreed upon by:: Patient/family  Discharge Summary Short term goals set: Patient will successfully identify 2 ways of making healthy decisions post d/c within 5 recreation therapy group sessions Short term goals met: Complete Progress toward goals comments: Groups attended Which groups?: Other (Comment) (Happiness, Necessities, Decision making) Reason goals not met: N/A Therapeutic equipment acquired: N/A Reason patient discharged from therapy: Discharge from hospital Pt/family agrees with progress & goals achieved: Yes Date patient discharged from therapy: 05/20/20   Celestino Ackerman 05/20/2020, 1:15 PM
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
## 2020-05-20 NOTE — Progress Notes (Signed)
Recreation Therapy Notes  Date: 05/20/2020  Time: 9:30 am   Location: Craft room     Behavioral response: N/A   Intervention Topic: Self-care      Discussion/Intervention: Patient did not attend group.   Clinical Observations/Feedback:  Patient did not attend group.   Jaeleah Smyser LRT/CTRS        Alejah Aristizabal 05/20/2020 11:58 AM

## 2020-05-20 NOTE — Plan of Care (Signed)
  Problem: Decision Making Goal: STG - Patient will successfully identify 2 ways of making healthy decisions post d/c within 5 recreation therapy group sessions Description: STG - Patient will successfully identify 2 ways of making healthy decisions post d/c within 5 recreation therapy group sessions Outcome: Completed/Met
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
## 2020-05-26 DIAGNOSIS — F312 Bipolar disorder, current episode manic severe with psychotic features: Secondary | ICD-10-CM | POA: Insufficient documentation

## 2020-06-01 DIAGNOSIS — M25471 Effusion, right ankle: Secondary | ICD-10-CM | POA: Insufficient documentation

## 2020-06-01 DIAGNOSIS — R748 Abnormal levels of other serum enzymes: Secondary | ICD-10-CM | POA: Insufficient documentation

## 2020-06-09 ENCOUNTER — Other Ambulatory Visit: Payer: Self-pay

## 2020-06-09 ENCOUNTER — Encounter (HOSPITAL_COMMUNITY): Payer: Self-pay

## 2020-06-09 ENCOUNTER — Emergency Department (HOSPITAL_COMMUNITY)
Admission: EM | Admit: 2020-06-09 | Discharge: 2020-06-10 | Disposition: A | Discharge status: Other Acute Inpatient Hospital | Payer: Self-pay | Attending: Emergency Medicine | Admitting: Emergency Medicine

## 2020-06-09 DIAGNOSIS — F1210 Cannabis abuse, uncomplicated: Secondary | ICD-10-CM | POA: Insufficient documentation

## 2020-06-09 DIAGNOSIS — R4689 Other symptoms and signs involving appearance and behavior: Secondary | ICD-10-CM

## 2020-06-09 DIAGNOSIS — F17210 Nicotine dependence, cigarettes, uncomplicated: Secondary | ICD-10-CM | POA: Insufficient documentation

## 2020-06-09 DIAGNOSIS — Z046 Encounter for general psychiatric examination, requested by authority: Secondary | ICD-10-CM | POA: Insufficient documentation

## 2020-06-09 DIAGNOSIS — F3160 Bipolar disorder, current episode mixed, unspecified: Secondary | ICD-10-CM | POA: Insufficient documentation

## 2020-06-09 DIAGNOSIS — Z20822 Contact with and (suspected) exposure to covid-19: Secondary | ICD-10-CM | POA: Insufficient documentation

## 2020-06-09 DIAGNOSIS — R456 Violent behavior: Secondary | ICD-10-CM | POA: Insufficient documentation

## 2020-06-09 DIAGNOSIS — Z7951 Long term (current) use of inhaled steroids: Secondary | ICD-10-CM | POA: Insufficient documentation

## 2020-06-09 DIAGNOSIS — F419 Anxiety disorder, unspecified: Secondary | ICD-10-CM | POA: Insufficient documentation

## 2020-06-09 DIAGNOSIS — J45909 Unspecified asthma, uncomplicated: Secondary | ICD-10-CM | POA: Insufficient documentation

## 2020-06-09 LAB — COMPREHENSIVE METABOLIC PANEL
ALT: 27 U/L (ref 0–44)
AST: 22 U/L (ref 15–41)
Albumin: 3.7 g/dL (ref 3.5–5.0)
Alkaline Phosphatase: 75 U/L (ref 38–126)
Anion gap: 9 (ref 5–15)
BUN: 10 mg/dL (ref 6–20)
CO2: 25 mmol/L (ref 22–32)
Calcium: 8.9 mg/dL (ref 8.9–10.3)
Chloride: 103 mmol/L (ref 98–111)
Creatinine, Ser: 0.58 mg/dL (ref 0.44–1.00)
GFR, Estimated: >60 mL/min (ref >60)
Glucose, Bld: 94 mg/dL (ref 70–99)
Potassium: 3.9 mmol/L (ref 3.5–5.1)
Sodium: 137 mmol/L (ref 135–145)
Total Bilirubin: 0.1 mg/dL — ABNORMAL LOW (ref 0.3–1.2)
Total Protein: 6.6 g/dL (ref 6.5–8.1)

## 2020-06-09 LAB — RESP PANEL BY RT-PCR (RSV, FLU A&B, COVID)  RVPGX2
Influenza A by PCR: NEGATIVE
Influenza B by PCR: NEGATIVE
Resp Syncytial Virus by PCR: NEGATIVE
SARS Coronavirus 2 by RT PCR: NEGATIVE

## 2020-06-09 LAB — CBC
HCT: 37.4 % (ref 36.0–46.0)
Hemoglobin: 11.8 g/dL — ABNORMAL LOW (ref 12.0–15.0)
MCH: 28.2 pg (ref 26.0–34.0)
MCHC: 31.6 g/dL (ref 30.0–36.0)
MCV: 89.3 fL (ref 80.0–100.0)
Platelets: 399 10*3/uL (ref 150–400)
RBC: 4.19 MIL/uL (ref 3.87–5.11)
RDW: 13.2 % (ref 11.5–15.5)
WBC: 12 10*3/uL — ABNORMAL HIGH (ref 4.0–10.5)
nRBC: 0 % (ref 0.0–0.2)

## 2020-06-09 LAB — SALICYLATE LEVEL: Salicylate Lvl: <7 mg/dL — ABNORMAL LOW (ref 7.0–30.0)

## 2020-06-09 LAB — RAPID URINE DRUG SCREEN, HOSP PERFORMED
Amphetamines: NOT DETECTED
Barbiturates: NOT DETECTED
Benzodiazepines: NOT DETECTED
Cocaine: NOT DETECTED
Opiates: NOT DETECTED
Tetrahydrocannabinol: POSITIVE — AB

## 2020-06-09 LAB — ACETAMINOPHEN LEVEL: Acetaminophen (Tylenol), Serum: <10 ug/mL — ABNORMAL LOW (ref 10–30)

## 2020-06-09 LAB — I-STAT BETA HCG BLOOD, ED (MC, WL, AP ONLY): I-stat hCG, quantitative: <5 m[IU]/mL (ref <5)

## 2020-06-09 LAB — ETHANOL: Alcohol, Ethyl (B): <10 mg/dL (ref <10)

## 2020-06-09 MED ORDER — HYDROXYZINE HCL 50 MG PO TABS
50.0000 mg | ORAL_TABLET | Freq: Four times a day (QID) | ORAL | Status: DC | PRN
  Administered 2020-06-09 – 2020-06-10 (×2): 50 mg via ORAL
  Filled 2020-06-09: qty 2
  Filled 2020-06-09: qty 1

## 2020-06-09 MED ORDER — ZIPRASIDONE MESYLATE 20 MG IM SOLR: 20.0000 mg | Freq: Every day | INTRAMUSCULAR | Status: DC | PRN

## 2020-06-09 MED ORDER — IBUPROFEN 400 MG PO TABS
400.0000 mg | ORAL_TABLET | Freq: Once | ORAL | Status: AC
  Administered 2020-06-09: 400 mg via ORAL
  Filled 2020-06-09: qty 1

## 2020-06-09 MED ORDER — OXCARBAZEPINE 300 MG PO TABS
600.0000 mg | ORAL_TABLET | Freq: Two times a day (BID) | ORAL | Status: DC
  Administered 2020-06-09 – 2020-06-10 (×2): 600 mg via ORAL
  Filled 2020-06-09 (×2): qty 2

## 2020-06-09 MED ORDER — IBUPROFEN 400 MG PO TABS
400.0000 mg | ORAL_TABLET | Freq: Four times a day (QID) | ORAL | Status: DC | PRN
  Administered 2020-06-09 – 2020-06-10 (×4): 400 mg via ORAL
  Filled 2020-06-09 (×4): qty 1

## 2020-06-09 MED ORDER — TRAZODONE HCL 50 MG PO TABS
50.0000 mg | ORAL_TABLET | Freq: Every day | ORAL | Status: DC
  Administered 2020-06-09: 50 mg via ORAL
  Filled 2020-06-09: qty 1

## 2020-06-09 MED ORDER — ACETAMINOPHEN 500 MG PO TABS
1000.0000 mg | ORAL_TABLET | Freq: Four times a day (QID) | ORAL | Status: DC | PRN
  Administered 2020-06-10: 1000 mg via ORAL
  Filled 2020-06-09: qty 2

## 2020-06-09 MED ORDER — NICOTINE 21 MG/24HR TD PT24
21.0000 mg | MEDICATED_PATCH | Freq: Every day | TRANSDERMAL | Status: DC
  Administered 2020-06-09: 21 mg via TRANSDERMAL
  Filled 2020-06-09: qty 1

## 2020-06-09 MED ORDER — OLANZAPINE 5 MG PO TBDP
10.0000 mg | ORAL_TABLET | Freq: Two times a day (BID) | ORAL | Status: DC | PRN
  Filled 2020-06-09: qty 2

## 2020-06-09 MED ORDER — ARIPIPRAZOLE ER 400 MG IM SRER: 400.0000 mg | INTRAMUSCULAR | Status: DC

## 2020-06-09 NOTE — ED Notes (Addendum)
Breakfast tray ordered 

## 2020-06-09 NOTE — ED Notes (Signed)
Pt sleeping at this time. Will obtain vitals when pt wakes up.  
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
## 2020-06-09 NOTE — ED Notes (Signed)
Pt talking to TTS 

## 2020-06-09 NOTE — ED Notes (Signed)
Lunch tray ordered 

## 2020-06-09 NOTE — ED Notes (Signed)
Patient agitated and walking to nurses station arguing with staff. Medicated as ordered.
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
## 2020-06-09 NOTE — ED Notes (Signed)
3 personal property bags locked in locker #1

## 2020-06-09 NOTE — ED Notes (Signed)
Pt.s dinner has been ordered. 
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
## 2020-06-09 NOTE — ED Notes (Signed)
Pt.s breakfast has arrived, pt sat up and eating her breakfast. Will continue to monitor pt.
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
## 2020-06-09 NOTE — ED Notes (Signed)
Pt tearful c/o pain in ankle and elbow- requesting ibuprofen

## 2020-06-09 NOTE — ED Notes (Signed)
TTS assessment in process  

## 2020-06-09 NOTE — ED Notes (Signed)
Dinner Tray Ordered @ 1713. 

## 2020-06-10 ENCOUNTER — Inpatient Hospital Stay (HOSPITAL_COMMUNITY)
Admission: AD | Admit: 2020-06-10 | Discharge: 2020-06-15 | DRG: 885 | Disposition: A | Discharge status: Home / Self Care | Payer: Federal, State, Local not specified - Other | Source: Intra-hospital | Attending: Emergency Medicine | Admitting: Emergency Medicine

## 2020-06-10 ENCOUNTER — Other Ambulatory Visit: Payer: Self-pay

## 2020-06-10 ENCOUNTER — Encounter (HOSPITAL_COMMUNITY): Payer: Self-pay | Admitting: Psychiatric/Mental Health

## 2020-06-10 ENCOUNTER — Other Ambulatory Visit: Payer: Self-pay | Admitting: Psychiatric/Mental Health

## 2020-06-10 DIAGNOSIS — K219 Gastro-esophageal reflux disease without esophagitis: Secondary | ICD-10-CM | POA: Diagnosis present

## 2020-06-10 DIAGNOSIS — Z9114 Patient's other noncompliance with medication regimen: Secondary | ICD-10-CM | POA: Diagnosis not present

## 2020-06-10 DIAGNOSIS — Z79899 Other long term (current) drug therapy: Secondary | ICD-10-CM

## 2020-06-10 DIAGNOSIS — F17200 Nicotine dependence, unspecified, uncomplicated: Secondary | ICD-10-CM | POA: Diagnosis present

## 2020-06-10 DIAGNOSIS — Z23 Encounter for immunization: Secondary | ICD-10-CM

## 2020-06-10 DIAGNOSIS — E785 Hyperlipidemia, unspecified: Secondary | ICD-10-CM | POA: Diagnosis present

## 2020-06-10 DIAGNOSIS — Z818 Family history of other mental and behavioral disorders: Secondary | ICD-10-CM

## 2020-06-10 DIAGNOSIS — F4310 Post-traumatic stress disorder, unspecified: Secondary | ICD-10-CM | POA: Diagnosis present

## 2020-06-10 DIAGNOSIS — Z20822 Contact with and (suspected) exposure to covid-19: Secondary | ICD-10-CM | POA: Diagnosis present

## 2020-06-10 DIAGNOSIS — F17290 Nicotine dependence, other tobacco product, uncomplicated: Secondary | ICD-10-CM | POA: Diagnosis present

## 2020-06-10 DIAGNOSIS — F3113 Bipolar disorder, current episode manic without psychotic features, severe: Secondary | ICD-10-CM | POA: Diagnosis present

## 2020-06-10 DIAGNOSIS — F1210 Cannabis abuse, uncomplicated: Secondary | ICD-10-CM | POA: Diagnosis present

## 2020-06-10 MED ORDER — ZIPRASIDONE MESYLATE 20 MG IM SOLR
20.0000 mg | INTRAMUSCULAR | Status: DC | PRN
Start: 2020-06-10 — End: 2020-06-15

## 2020-06-10 MED ORDER — OXCARBAZEPINE 300 MG PO TABS
600.0000 mg | ORAL_TABLET | Freq: Two times a day (BID) | ORAL | Status: DC
  Administered 2020-06-10 – 2020-06-15 (×10): 600 mg via ORAL
  Filled 2020-06-10 (×13): qty 2

## 2020-06-10 MED ORDER — LORAZEPAM 1 MG PO TABS
1.0000 mg | ORAL_TABLET | ORAL | Status: AC | PRN
Start: 2020-06-10 — End: 2020-06-11
  Administered 2020-06-11: 1 mg via ORAL
  Filled 2020-06-10: qty 1

## 2020-06-10 MED ORDER — NICOTINE 21 MG/24HR TD PT24
21.0000 mg | MEDICATED_PATCH | Freq: Every day | TRANSDERMAL | Status: DC
  Administered 2020-06-11 – 2020-06-15 (×6): 21 mg via TRANSDERMAL
  Filled 2020-06-10 (×7): qty 1

## 2020-06-10 MED ORDER — OLANZAPINE 5 MG PO TBDP
5.0000 mg | ORAL_TABLET | Freq: Three times a day (TID) | ORAL | Status: DC | PRN
  Administered 2020-06-14: 5 mg via ORAL
  Filled 2020-06-10: qty 1

## 2020-06-10 MED ORDER — MAGNESIUM HYDROXIDE 400 MG/5ML PO SUSP
30.0000 mL | Freq: Every day | ORAL | Status: DC | PRN
  Administered 2020-06-10: 30 mL via ORAL
  Filled 2020-06-10: qty 30

## 2020-06-10 MED ORDER — HYDROXYZINE HCL 50 MG PO TABS
50.0000 mg | ORAL_TABLET | Freq: Four times a day (QID) | ORAL | Status: DC | PRN
  Administered 2020-06-11 (×2): 50 mg via ORAL
  Filled 2020-06-10 (×2): qty 1

## 2020-06-10 MED ORDER — TRAZODONE HCL 50 MG PO TABS
50.0000 mg | ORAL_TABLET | Freq: Every day | ORAL | Status: DC
  Administered 2020-06-11 – 2020-06-14 (×5): 50 mg via ORAL
  Filled 2020-06-10 (×9): qty 1

## 2020-06-10 MED ORDER — ACETAMINOPHEN 325 MG PO TABS
650.0000 mg | ORAL_TABLET | Freq: Four times a day (QID) | ORAL | Status: DC | PRN
  Administered 2020-06-11 – 2020-06-15 (×7): 650 mg via ORAL
  Filled 2020-06-10 (×7): qty 2

## 2020-06-10 MED ORDER — TRAZODONE HCL 50 MG PO TABS
50.0000 mg | ORAL_TABLET | Freq: Every evening | ORAL | Status: DC | PRN
  Filled 2020-06-10: qty 1

## 2020-06-10 MED ORDER — ONDANSETRON 4 MG PO TBDP
4.0000 mg | ORAL_TABLET | Freq: Once | ORAL | Status: AC
  Administered 2020-06-10: 4 mg via ORAL
  Filled 2020-06-10: qty 1

## 2020-06-10 MED ORDER — ALUM & MAG HYDROXIDE-SIMETH 200-200-20 MG/5ML PO SUSP
30.0000 mL | ORAL | Status: DC | PRN
  Administered 2020-06-13: 30 mL via ORAL
  Filled 2020-06-10: qty 30

## 2020-06-10 MED ORDER — ARIPIPRAZOLE ER 400 MG IM SRER: 400.0000 mg | INTRAMUSCULAR | Status: DC
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
## 2020-06-10 NOTE — ED Notes (Signed)
Called officer to tx pt to [facility]

## 2020-06-10 NOTE — ED Notes (Signed)
Pt tearful at this time, reporting nausea. Pt provided ice for ankle, warm blanket and snacks at this time.

## 2020-06-10 NOTE — Progress Notes (Incomplete)
`  Patient ID: Kelly Carr, adult   DOB: 1970-06-28, 50 y.o.   MRN: [id number]

## 2020-06-10 NOTE — ED Notes (Signed)
Pt provided PO fluids per request.

## 2020-06-10 NOTE — ED Notes (Signed)
Patient requesting anxiolytic medication.

## 2020-06-10 NOTE — ED Notes (Signed)
Patient complains of right ankle pain, was seen at another facility for same. No new injury.

## 2020-06-10 NOTE — ED Notes (Signed)
Patient requesting medication for nausea. No emesis, denies abdominal pain. Provider made aware.

## 2020-06-11 DIAGNOSIS — F3113 Bipolar disorder, current episode manic without psychotic features, severe: Principal | ICD-10-CM

## 2020-06-11 LAB — TSH: TSH: 2.477 u[IU]/mL (ref 0.350–4.500)

## 2020-06-11 LAB — HEMOGLOBIN A1C
Hgb A1c MFr Bld: 5 % (ref 4.8–5.6)
Mean Plasma Glucose: 96.8 mg/dL

## 2020-06-11 LAB — LIPID PANEL
Cholesterol: 230 mg/dL — ABNORMAL HIGH (ref 0–200)
HDL: 59 mg/dL (ref >40)
LDL Cholesterol: 149 mg/dL — ABNORMAL HIGH (ref 0–99)
Total CHOL/HDL Ratio: 3.9 RATIO
Triglycerides: 109 mg/dL (ref <150)
VLDL: 22 mg/dL (ref 0–40)

## 2020-06-11 MED ORDER — ARIPIPRAZOLE ER 400 MG IM SRER
400.0000 mg | INTRAMUSCULAR | Status: DC
  Administered 2020-06-12: 400 mg via INTRAMUSCULAR

## 2020-06-11 MED ORDER — LORAZEPAM 1 MG PO TABS
1.0000 mg | ORAL_TABLET | ORAL | Status: DC | PRN
  Administered 2020-06-11: 1 mg via ORAL
  Filled 2020-06-11: qty 1

## 2020-06-11 MED ORDER — POLYETHYLENE GLYCOL 3350 17 G PO PACK
17.0000 g | PACK | Freq: Every day | ORAL | Status: DC | PRN
  Administered 2020-06-11 – 2020-06-12 (×2): 17 g via ORAL

## 2020-06-11 MED ORDER — PSYLLIUM 95 % PO PACK
1.0000 | PACK | Freq: Every day | ORAL | Status: DC
  Administered 2020-06-11 – 2020-06-14 (×3): 1 via ORAL
  Filled 2020-06-11 (×6): qty 1

## 2020-06-11 MED ORDER — HYDROCERIN EX CREA
TOPICAL_CREAM | Freq: Two times a day (BID) | CUTANEOUS | Status: DC | PRN
  Administered 2020-06-11 – 2020-06-15 (×3): 1 via TOPICAL
  Filled 2020-06-11: qty 113
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
## 2020-06-11 NOTE — Progress Notes (Signed)
Pt coming out her room continuously asking for numerous items, when pt told no to something pt gets irritable. Pt gets sarcastic , then tries to talk like she is better than Clinical research associate.  Pt was focused on the pain in her elbow "I have a hematoma in my elbow ". Pt was seen leaning on the nurses station on the elbow that she was complaining to be hurting, pt has asked for numerous ice packs.

## 2020-06-11 NOTE — Progress Notes (Signed)
Pt hyper-verbal, hypomanic, needy at times, but pleasant     06/11/20 0000  Psych Admission Type (Psych Patients Only)  Admission Status Involuntary  Psychosocial Assessment  Patient Complaints Anxiety  Eye Contact Fair  Facial Expression Animated  Affect Appropriate to circumstance  Speech Logical/coherent  Interaction Assertive  Motor Activity Restless  Appearance/Hygiene Unremarkable  Behavior Characteristics Cooperative  Mood Anxious  Thought Process  Coherency WDL  Content WDL  Delusions WDL  Perception WDL  Hallucination None reported or observed  Judgment WDL  Confusion WDL  Danger to Self  Current suicidal ideation? Active  Self-Injurious Behavior No self-injurious ideation or behavior indicators observed or expressed   Agreement Not to Harm Self Yes  Danger to Others  Danger to Others None reported or observed  Danger to Others Abnormal  Harmful Behavior to others No threats or harm toward other people  Destructive Behavior No threats or harm toward property

## 2020-06-11 NOTE — BHH Suicide Risk Assessment (Signed)
[facility] Admission Suicide Risk Assessment   Nursing information obtained from:  Patient Demographic factors:  Caucasian Current Mental Status:  NA Loss Factors:  Loss of significant relationship Historical Factors:  NA Risk Reduction Factors:  Living with another person, especially a relative  Total Time spent with patient: 20 minutes Principal Problem: <principal problem not specified> Diagnosis:  Active Problems:   Bipolar affective disorder, manic, severe (HCC)  Subjective Data: 50 year old female with bipolar disorder presenting in manic episode.  Continued Clinical Symptoms:  Alcohol Use Disorder Identification Test Final Score (AUDIT): 1 The "Alcohol Use Disorders Identification Test", Guidelines for Use in Primary Care, Second Edition.  World Science writer [facility]). Score between 0-7:  no or low risk or alcohol related problems. Score between 8-15:  moderate risk of alcohol related problems. Score between 16-19:  high risk of alcohol related problems. Score 20 or above:  warrants further diagnostic evaluation for alcohol dependence and treatment.   CLINICAL FACTORS:   Bipolar Disorder:   Mixed State    See HPI for Mental status exam  COGNITIVE FEATURES THAT CONTRIBUTE TO RISK:  None    SUICIDE RISK:   Moderate:  Frequent suicidal ideation with limited intensity, and duration, some specificity in terms of plans, no associated intent, good self-control, limited dysphoria/symptomatology, some risk factors present, and identifiable protective factors, including available and accessible social support.  PLAN OF CARE: Continue care on Inpatient unit  I certify that inpatient services furnished can reasonably be expected to improve the patient's condition.   Clement Sayres, MD 06/11/2020, 12:35 PM

## 2020-06-11 NOTE — Progress Notes (Signed)
Dar Note: Patient presents with anxious affect and mood.  Denies suicidal thoughts, auditory and visual hallucinations.  Medication given as prescribed.  Received Miralax and Metamucil for complain of constipation with good effect.  Routine safety checks maintained.  Attended group and participated.  Patient visible in milieu interacting with staff and peers.  Patient is safe on and off the unit.
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
## 2020-06-11 NOTE — Progress Notes (Signed)
Pt continues to be controversial and argumentative with Clinical research associate.

## 2020-06-11 NOTE — Progress Notes (Signed)
Pt visible in the dayroom with peers acting appropriately this evening.     06/11/20 2100  Psych Admission Type (Psych Patients Only)  Admission Status Involuntary  Psychosocial Assessment  Patient Complaints Anxiety  Eye Contact Fair  Facial Expression Animated  Affect Appropriate to circumstance  Speech Logical/coherent  Interaction Assertive  Motor Activity Restless  Appearance/Hygiene Unremarkable  Behavior Characteristics Anxious  Mood Anxious;Labile  Thought Process  Coherency WDL  Content WDL  Delusions WDL  Perception WDL  Hallucination None reported or observed  Judgment WDL  Confusion WDL  Danger to Self  Current suicidal ideation? Active  Self-Injurious Behavior No self-injurious ideation or behavior indicators observed or expressed   Agreement Not to Harm Self Yes  Danger to Others  Danger to Others None reported or observed  Danger to Others Abnormal  Harmful Behavior to others No threats or harm toward other people  Destructive Behavior No threats or harm toward property

## 2020-06-11 NOTE — Progress Notes (Signed)
Recreation Therapy Notes  Date: 1.6.22 Time: 1000 Location: [address] Dayroom  Group Topic: Communication, Team Building, Problem Solving  Goal Area(s) Addresses:  Patient will effectively work with peer towards shared goal.  Patient will identify skill used to make activity successful.  Patient will identify how skills used during activity can be used to reach post d/c goals.   Behavioral Response: Engaged  Intervention: STEM Activity   Activity: Straw Bridge.  In groups, patients were given 15 straws and 60ft of tape.  Patients were to build a free standing bridge that could hold a small puzzle box.  Education: Pharmacist, community, Building control surveyor.   Education Outcome: Acknowledges education  Clinical Observations/Feedback: Pt was social and active during group session.  Pt followed along with the ideas of her peer.  Pt made a few suggestions as she saw fit but was not leading the group.  Pt was pleasant and appropriate during group session.    Caroll Rancher, LRT/CTRS        Caroll Rancher A 06/11/2020 11:38 AM

## 2020-06-12 MED ORDER — LORAZEPAM 1 MG PO TABS
1.0000 mg | ORAL_TABLET | Freq: Four times a day (QID) | ORAL | Status: DC | PRN
  Administered 2020-06-12 – 2020-06-13 (×3): 1 mg via ORAL
  Filled 2020-06-12 (×3): qty 1

## 2020-06-12 NOTE — Progress Notes (Signed)
Patient denies SI, HI and AVH this shift.  Patient has had no incidents of behavioral dyscontrol.    Assess patient for safety, offer medications as prescribed.   Continue to monitor patient as planned.

## 2020-06-12 NOTE — [facility] Group Notes (Signed)
[facility] LCSW Group Therapy  06/12/2020 1:46 PM  Type of Therapy:  Group Therapy: Stressors and Coping Skills  Participation Level:  Active  Participation Quality:  Appropriate, Attentive, Resistant and Supportive  Summary of Progress/Problems: Pt shared that her relationships and family are currently daily stressors for her. Pt shared that the loss of family and friends is something she has a hard time dealing with. Pt shared that she prays and exercises daily to cope with daily stressors. Pt shared that she would like to get a job and move.   Felizardo Hoffmann 06/12/2020, 1:46 PM

## 2020-06-12 NOTE — Progress Notes (Signed)
[facility] MD Progress Note  06/12/2020 2:08 PM Kelly Carr  MRN:  [id number]  Principal Problem: <principal problem not specified> Diagnosis: Active Problems:   Bipolar affective disorder, manic, severe (HCC)  Total Time spent with patient: 30 minutes Daily Note:   Patient seen, chart reviewed and case discussed with treatment team.  Patient is still somewhat pressured and tangential upon approach, but states that she was able to get restful sleep last night. She also adds that she has had a bowel movement and is feeling much better and eating without issue. It was discussed with patient that she is still presenting as somewhat manic, and that she will receive her injection today with hopes to improve her symptoms. Patient expressed agreement and understanding.   She is attending groups and making her needs known. Compliant with medications with no reported or observed side effects.   Past Medical History:  Past Medical History:  Diagnosis Date  . Abnormal Pap smear    Unknown results>colpo>normal  . Anxiety   . Arthritis   . Asthma   . Bipolar 1 disorder ([place])   . Depression     Past Surgical History:  Procedure Laterality Date  . NO PAST SURGERIES     Family History:  Family History  Problem Relation Age of Onset  . Diabetes Mother   . Hypertension Mother   . Heart disease Mother 27  . Schizophrenia Mother   . Diabetes Maternal Grandmother   . Heart disease Maternal Grandmother   . Diabetes Maternal Grandfather   . Heart disease Maternal Grandfather   . Bipolar disorder Cousin   . Bipolar disorder Nephew   . Depression Daughter     Social History:  Social History   Substance and Sexual Activity  Alcohol Use Not Currently     Social History   Substance and Sexual Activity  Drug Use Not Currently  . Types: Marijuana   Comment: rare pot use    Social History   Socioeconomic History  . Marital status: Single    Spouse name: Not on file  . Number of  children: Not on file  . Years of education: Not on file  . Highest education level: Not on file  Occupational History  . Not on file  Tobacco Use  . Smoking status: Current Some Day Smoker    Packs/day: 0.10    Types: E-cigarettes  . Smokeless tobacco: Never Used  Vaping Use  . Vaping Use: Every day  Substance and Sexual Activity  . Alcohol use: Not Currently  . Drug use: Not Currently    Types: Marijuana    Comment: rare pot use  . Sexual activity: Yes    Partners: Male    Birth control/protection: None  Other Topics Concern  . Not on file  Social History Narrative  . Not on file   Social Determinants of Health   Financial Resource Strain: Not on file  Food Insecurity: Not on file  Transportation Needs: Not on file  Physical Activity: Not on file  Stress: Not on file  Social Connections: Not on file   Additional Social History:                         Sleep: Fair  Appetite:  Fair  Current Medications: Current Facility-Administered Medications  Medication Dose Route Frequency Provider Last Rate Last Admin  . acetaminophen (TYLENOL) tablet 650 mg  650 mg Oral Q6H PRN Connye Burkitt, NP  650 mg at 06/12/20 0925  . alum & mag hydroxide-simeth (MAALOX/MYLANTA) 200-200-20 MG/5ML suspension 30 mL  30 mL Oral Q4H PRN Aldean Baker, NP      . ARIPiprazole ER (ABILIFY MAINTENA) injection 400 mg  400 mg Intramuscular Q28 days Shandricka Monroy, Worthy Rancher, MD   400 mg at 06/12/20 1139  . hydrocerin (EUCERIN) cream   Topical BID PRN Clement Sayres, MD   1 application at 06/11/20 2213  . LORazepam (ATIVAN) tablet 1 mg  1 mg Oral Q4H PRN Rudell Ortman, Worthy Rancher, MD   1 mg at 06/11/20 2204  . magnesium hydroxide (MILK OF MAGNESIA) suspension 30 mL  30 mL Oral Daily PRN Aldean Baker, NP   30 mL at 06/10/20 2051  . nicotine (NICODERM CQ - dosed in mg/24 hours) patch 21 mg  21 mg Transdermal Daily Aldean Baker, NP   21 mg at 06/12/20 0757  . OLANZapine zydis (ZYPREXA)  disintegrating tablet 5 mg  5 mg Oral Q8H PRN Aldean Baker, NP       And  . ziprasidone (GEODON) injection 20 mg  20 mg Intramuscular PRN Aldean Baker, NP      . Oxcarbazepine (TRILEPTAL) tablet 600 mg  600 mg Oral BID Aldean Baker, NP   600 mg at 06/12/20 0757  . polyethylene glycol (MIRALAX / GLYCOLAX) packet 17 g  17 g Oral Daily PRN Aristotelis Vilardi, Worthy Rancher, MD   17 g at 06/12/20 1349  . psyllium (HYDROCIL/METAMUCIL) 1 packet  1 packet Oral Daily Deslyn Cavenaugh, Worthy Rancher, MD   1 packet at 06/12/20 0757  . traZODone (DESYREL) tablet 50 mg  50 mg Oral QHS PRN Aldean Baker, NP      . traZODone (DESYREL) tablet 50 mg  50 mg Oral QHS Aldean Baker, NP   50 mg at 06/11/20 2204    Lab Results:  Results for orders placed or performed during the hospital encounter of 06/10/20 (from the past 48 hour(s))  Hemoglobin A1c     Status: None   Collection Time: 06/11/20  6:42 AM  Result Value Ref Range   Hgb A1c MFr Bld 5.0 4.8 - 5.6 %    Comment: (NOTE) Pre diabetes:          5.7%-6.4%  Diabetes:              >6.4%  Glycemic control for   <7.0% adults with diabetes    Mean Plasma Glucose 96.8 mg/dL    Comment: Performed at [facility], [address], [place], [place] [address]  Lipid panel     Status: Abnormal   Collection Time: 06/11/20  6:42 AM  Result Value Ref Range   Cholesterol 230 (H) 0 - 200 mg/dL   Triglycerides 646 <803 mg/dL   HDL 59 >21 mg/dL   Total CHOL/HDL Ratio 3.9 RATIO   VLDL 22 0 - 40 mg/dL   LDL Cholesterol 224 (H) 0 - 99 mg/dL    Comment:        Total Cholesterol/HDL:CHD Risk Coronary Heart Disease Risk Table                     Men   Women  1/2 Average Risk   3.4   3.3  Average Risk       5.0   4.4  2 X Average Risk   9.6   7.1  3 X Average Risk  23.4   11.0  Use the calculated Patient Ratio above and the CHD Risk Table to determine the patient's CHD Risk.        ATP III CLASSIFICATION (LDL):  <100     mg/dL   Optimal  678-938  mg/dL   Near or  Above                    Optimal  130-159  mg/dL   Borderline  101-751  mg/dL   High  >025     mg/dL   Very High Performed at [facility], [address], [place], [place] [address]   TSH     Status: None   Collection Time: 06/11/20  6:42 AM  Result Value Ref Range   TSH 2.477 0.350 - 4.500 uIU/mL    Comment: Performed by a 3rd Generation assay with a functional sensitivity of <=0.01 uIU/mL. Performed at [facility], [address], [place], [place] [address]     Blood Alcohol level:  Lab Results  Component Value Date   [facility] <10 06/09/2020   ETH <10 05/13/2020    Metabolic Disorder Labs: Lab Results  Component Value Date   HGBA1C 5.0 06/11/2020   MPG 96.8 06/11/2020   MPG 105.41 05/13/2020   Lab Results  Component Value Date   PROLACTIN 39.5 (H) 04/12/2018   PROLACTIN 2.0 12/05/2011   Lab Results  Component Value Date   CHOL 230 (H) 06/11/2020   TRIG 109 06/11/2020   HDL 59 06/11/2020   CHOLHDL 3.9 06/11/2020   VLDL 22 06/11/2020   LDLCALC 149 (H) 06/11/2020   LDLCALC 135 (H) 05/13/2020    Physical Findings: AIMS:  , ,  ,  ,    CIWA:    COWS:       Psychiatric Specialty Exam: Physical Exam  Review of Systems  Blood pressure 118/80, pulse 99, temperature 97.9 F (36.6 C), temperature source Oral, SpO2 97 %.There is no height or weight on file to calculate BMI.  General Appearance: Casual  Eye Contact:  Fair  Speech:  Pressured  Volume:  Increased  Mood:  Anxious  Affect:  Congruent  Thought Process:  Disorganized  Orientation:  Full (Time, Place, and Person)  Thought Content:  Tangential  Suicidal Thoughts:  No  Homicidal Thoughts:  No  Memory:  Remote;   Fair  Judgement:  Impaired  Insight:  Lacking  Psychomotor Activity:  Normal  Concentration:  Concentration: Fair  Recall:  [employer] of Knowledge:  Fair  Language:  Fair  Akathisia:  No  Handed:  Right  AIMS (if indicated):     Assets:  Desire for  Improvement Physical Health  ADL's:  Intact  Cognition:  WNL  Sleep:  Number of Hours: 3.75     Treatment Plan Summary: Daily contact with patient to assess and evaluate symptoms and progress in treatment and Medication management   Continue with Trileptal 600mg  BID, consider level draw for Sunday Abilify 400mg  Injection given on January 7th Continue trazodone 50mg  qhs Decrease ativan to 1 mg q 6 hours  , MD 06/12/2020, 2:08 PM

## 2020-06-12 NOTE — Progress Notes (Signed)
   06/12/20 2200  Psych Admission Type (Psych Patients Only)  Admission Status Involuntary  Psychosocial Assessment  Patient Complaints Anxiety  Eye Contact Fair  Facial Expression Animated  Affect Appropriate to circumstance  Speech Logical/coherent  Interaction Assertive  Motor Activity Restless  Appearance/Hygiene Unremarkable  Behavior Characteristics Anxious  Mood Labile;Anxious  Thought Process  Coherency WDL  Content WDL  Delusions WDL  Perception WDL  Hallucination None reported or observed  Judgment WDL  Confusion WDL  Danger to Self  Current suicidal ideation? Active  Self-Injurious Behavior No self-injurious ideation or behavior indicators observed or expressed   Agreement Not to Harm Self Yes  Danger to Others  Danger to Others None reported or observed  Danger to Others Abnormal  Harmful Behavior to others No threats or harm toward other people  Destructive Behavior No threats or harm toward property

## 2020-06-12 NOTE — Tx Team (Signed)
Interdisciplinary Treatment and Diagnostic Plan Update  06/12/2020 Time of Session: 9:50am Kelly Carr MRN: [id number]  Principal Diagnosis: <principal problem not specified>  Secondary Diagnoses: Active Problems:   Bipolar affective disorder, manic, severe (HCC)   Current Medications:  Current Facility-Administered Medications  Medication Dose Route Frequency Provider Last Rate Last Admin  . acetaminophen (TYLENOL) tablet 650 mg  650 mg Oral Q6H PRN Connye Burkitt, NP   650 mg at 06/12/20 0925  . alum & mag hydroxide-simeth (MAALOX/MYLANTA) 200-200-20 MG/5ML suspension 30 mL  30 mL Oral Q4H PRN Connye Burkitt, NP      . ARIPiprazole ER (ABILIFY MAINTENA) injection 400 mg  400 mg Intramuscular Q28 days Cristofano, Dorene Ar, MD   400 mg at 06/12/20 1139  . hydrocerin (EUCERIN) cream   Topical BID PRN Dixie Dials, MD   1 application at 00/93/81 2213  . LORazepam (ATIVAN) tablet 1 mg  1 mg Oral Q4H PRN Cristofano, Dorene Ar, MD   1 mg at 06/11/20 2204  . magnesium hydroxide (MILK OF MAGNESIA) suspension 30 mL  30 mL Oral Daily PRN Connye Burkitt, NP   30 mL at 06/10/20 2051  . nicotine (NICODERM CQ - dosed in mg/24 hours) patch 21 mg  21 mg Transdermal Daily Connye Burkitt, NP   21 mg at 06/12/20 0757  . OLANZapine zydis (ZYPREXA) disintegrating tablet 5 mg  5 mg Oral Q8H PRN Connye Burkitt, NP       And  . ziprasidone (GEODON) injection 20 mg  20 mg Intramuscular PRN Connye Burkitt, NP      . Oxcarbazepine (TRILEPTAL) tablet 600 mg  600 mg Oral BID Connye Burkitt, NP   600 mg at 06/12/20 0757  . polyethylene glycol (MIRALAX / GLYCOLAX) packet 17 g  17 g Oral Daily PRN Cristofano, Dorene Ar, MD   17 g at 06/11/20 1214  . psyllium (HYDROCIL/METAMUCIL) 1 packet  1 packet Oral Daily Cristofano, Dorene Ar, MD   1 packet at 06/12/20 0757  . traZODone (DESYREL) tablet 50 mg  50 mg Oral QHS PRN Connye Burkitt, NP      . traZODone (DESYREL) tablet 50 mg  50 mg Oral QHS Connye Burkitt, NP   50 mg at  06/11/20 2204   PTA Medications: Medications Prior to Admission  Medication Sig Dispense Refill Last Dose  . [START ON 06/15/2020] ARIPiprazole ER (ABILIFY MAINTENA) 400 MG SRER injection Inject 2 mLs (400 mg total) into the muscle every 28 (twenty-eight) days. 1 each 3   . fluticasone (FLONASE) 50 MCG/ACT nasal spray Place 2 sprays into both nostrils daily. 9.9 mL 2   . hydrOXYzine (ATARAX/VISTARIL) 50 MG tablet Take 1 tablet (50 mg total) by mouth 3 (three) times daily as needed for anxiety. 90 tablet 1   . ibuprofen (ADVIL) 200 MG tablet Take 200 mg by mouth every 6 (six) hours as needed for headache or mild pain.     . Oxcarbazepine (TRILEPTAL) 300 MG tablet Take 2.5 tablets (750 mg total) by mouth 2 (two) times daily. 150 tablet 1   . traZODone (DESYREL) 100 MG tablet Take 1 tablet (100 mg total) by mouth at bedtime as needed for sleep. 30 tablet 1     Patient Stressors:    Patient Strengths:    Treatment Modalities: Medication Management, Group therapy, Case management,  1 to 1 session with clinician, Psychoeducation, Recreational therapy.   Physician Treatment Plan for Primary Diagnosis: <principal problem  not specified> Long Term Goal(s): Improvement in symptoms so as ready for discharge Improvement in symptoms so as ready for discharge   Short Term Goals: Ability to identify changes in lifestyle to reduce recurrence of condition will improve Ability to verbalize feelings will improve Ability to disclose and discuss suicidal ideas Ability to demonstrate self-control will improve Ability to identify and develop effective coping behaviors will improve Ability to maintain clinical measurements within normal limits will improve Compliance with prescribed medications will improve Ability to identify triggers associated with substance abuse/mental health issues will improve Ability to identify changes in lifestyle to reduce recurrence of condition will improve Ability to verbalize  feelings will improve Ability to disclose and discuss suicidal ideas Ability to demonstrate self-control will improve Ability to identify and develop effective coping behaviors will improve Ability to maintain clinical measurements within normal limits will improve Compliance with prescribed medications will improve Ability to identify triggers associated with substance abuse/mental health issues will improve  Medication Management: Evaluate patient's response, side effects, and tolerance of medication regimen.  Therapeutic Interventions: 1 to 1 sessions, Unit Group sessions and Medication administration.  Evaluation of Outcomes: Progressing  Physician Treatment Plan for Secondary Diagnosis: Active Problems:   Bipolar affective disorder, manic, severe ([place])  Long Term Goal(s): Improvement in symptoms so as ready for discharge Improvement in symptoms so as ready for discharge   Short Term Goals: Ability to identify changes in lifestyle to reduce recurrence of condition will improve Ability to verbalize feelings will improve Ability to disclose and discuss suicidal ideas Ability to demonstrate self-control will improve Ability to identify and develop effective coping behaviors will improve Ability to maintain clinical measurements within normal limits will improve Compliance with prescribed medications will improve Ability to identify triggers associated with substance abuse/mental health issues will improve Ability to identify changes in lifestyle to reduce recurrence of condition will improve Ability to verbalize feelings will improve Ability to disclose and discuss suicidal ideas Ability to demonstrate self-control will improve Ability to identify and develop effective coping behaviors will improve Ability to maintain clinical measurements within normal limits will improve Compliance with prescribed medications will improve Ability to identify triggers associated with substance  abuse/mental health issues will improve     Medication Management: Evaluate patient's response, side effects, and tolerance of medication regimen.  Therapeutic Interventions: 1 to 1 sessions, Unit Group sessions and Medication administration.  Evaluation of Outcomes: Progressing   RN Treatment Plan for Primary Diagnosis: <principal problem not specified> Long Term Goal(s): Knowledge of disease and therapeutic regimen to maintain health will improve  Short Term Goals: Ability to remain free from injury will improve, Ability to demonstrate self-control and Ability to participate in decision making will improve  Medication Management: RN will administer medications as ordered by provider, will assess and evaluate patient's response and provide education to patient for prescribed medication. RN will report any adverse and/or side effects to prescribing provider.  Therapeutic Interventions: 1 on 1 counseling sessions, Psychoeducation, Medication administration, Evaluate responses to treatment, Monitor vital signs and CBGs as ordered, Perform/monitor CIWA, COWS, AIMS and Fall Risk screenings as ordered, Perform wound care treatments as ordered.  Evaluation of Outcomes: Not Met   LCSW Treatment Plan for Primary Diagnosis: <principal problem not specified> Long Term Goal(s): Safe transition to appropriate next level of care at discharge, Engage patient in therapeutic group addressing interpersonal concerns.  Short Term Goals: Engage patient in aftercare planning with referrals and resources, Increase ability to appropriately verbalize feelings and Facilitate  acceptance of mental health diagnosis and concerns  Therapeutic Interventions: Assess for all discharge needs, 1 to 1 time with Social worker, Explore available resources and support systems, Assess for adequacy in community support network, Educate family and significant other(s) on suicide prevention, Complete Psychosocial Assessment,  Interpersonal group therapy.  Evaluation of Outcomes: Progressing   Progress in Treatment: Attending groups: Yes. Participating in groups: Yes. Taking medication as prescribed: Yes. Toleration medication: Yes. Family/Significant other contact made: No, will contact:  pt decliend consents Patient understands diagnosis: Yes. Discussing patient identified problems/goals with staff: Yes. Medical problems stabilized or resolved: Yes. Denies suicidal/homicidal ideation: Yes. Issues/concerns per patient self-inventory: Yes. Other:   New problem(s) identified: No, Describe:  CSW will continue to assess  New Short Term/Long Term Goal(s):medication stabilization, elimination of SI thoughts, development of comprehensive mental wellness plan.  Patient Goals:  "to stay focused on discharge and rest"  Discharge Plan or Barriers: Patient recently admitted. CSW will continue to follow and assess for appropriate referrals and possible discharge planning. Reason for Continuation of Hospitalization: Anxiety Mania Medication stabilization  Estimated Length of Stay: 3-5 days  Attendees: Patient: Kelly Carr 06/12/2020   Physician: Dr. Claris Gower 06/12/2020   Nursing:  06/12/2020   RN Care Manager: 06/12/2020   Social Worker: Toney Reil, [place] 06/12/2020   Recreational Therapist:  06/12/2020   Other:  06/12/2020   Other:  06/12/2020   Other: 06/12/2020      Scribe for Treatment Team: Mliss Fritz, [place] 06/12/2020 1:29 PM

## 2020-06-12 NOTE — Progress Notes (Signed)
Recreation Therapy Notes  Date: 1.7.22 Time: 1000 Location: [address] Dayroom      Group Topic/Focus: Team Building/Communication  Goal Area(s) Addresses:  Patient will use appropriate interactions in play with peers.   Patient will follow directions on first prompt. Patient will work with peers towards desired goal.  Behavioral Response: Appropriate   Intervention: [employer] and Music  Activity: [employer].  Seated in a circle, patients were to pass the beach ball to each other.  Patients were to keep the ball in rotation without it coming to a stop.  Patients could bounce the ball off the or the wall if necessary to keep it moving.  LRT would count the number of hits were made on the ball.  If the ball came to a complete stop, the count would start over.  Clinical Observations/Feedback: Pt was engaged and bright throughout group session.  Pt would get up and go after the ball when it went outside of the circle even if it wasn't on the side she was sitting on.  Pt was social with peers.  Pt expressed she broke a sweat during the activity and had a good time.     Caroll Rancher, LRT/CTRS         Caroll Rancher A 06/12/2020 12:09 PM

## 2020-06-12 NOTE — Progress Notes (Signed)
Adult Psychoeducational Group Note  Date:  06/12/2020 Time:  8:57 PM  Group Topic/Focus:  Wrap-Up Group:   The focus of this group is to help patients review their daily goal of treatment and discuss progress on daily workbooks.  Participation Level:  Did Not Attend  Participation Quality:  Did Not Attend  Affect:  Did Not Attend  Cognitive:  Did Not Attend  Insight: None  Engagement in Group:  Did Not Attend  Modes of Intervention:  Did Not Attend  Additional Comments:  Pt did not attend evening wrap up group tonight.  Felipa Furnace 06/12/2020, 8:57 PM

## 2020-06-13 MED ORDER — HALOPERIDOL LACTATE 5 MG/ML IJ SOLN: 2.0000 mg | Freq: Three times a day (TID) | INTRAMUSCULAR | Status: DC | PRN

## 2020-06-13 MED ORDER — LORAZEPAM 0.5 MG PO TABS: 0.5000 mg | ORAL_TABLET | Freq: Three times a day (TID) | ORAL | Status: DC | PRN

## 2020-06-13 MED ORDER — LORAZEPAM 2 MG/ML IJ SOLN: 2.0000 mg | Freq: Three times a day (TID) | INTRAMUSCULAR | Status: DC | PRN

## 2020-06-13 MED ORDER — LORAZEPAM 1 MG PO TABS: 1.0000 mg | ORAL_TABLET | Freq: Three times a day (TID) | ORAL | Status: DC | PRN

## 2020-06-13 MED ORDER — HALOPERIDOL 2 MG PO TABS: 2.0000 mg | ORAL_TABLET | Freq: Three times a day (TID) | ORAL | Status: DC | PRN

## 2020-06-13 MED ORDER — LORAZEPAM 2 MG/ML IJ SOLN: 1.0000 mg | Freq: Three times a day (TID) | INTRAMUSCULAR | Status: DC | PRN

## 2020-06-13 MED ORDER — FLUTICASONE PROPIONATE 50 MCG/ACT NA SUSP
2.0000 | Freq: Every day | NASAL | Status: DC
  Administered 2020-06-14 – 2020-06-15 (×2): 2 via NASAL
  Filled 2020-06-13 (×2): qty 16

## 2020-06-13 MED ORDER — INFLUENZA VAC SPLIT QUAD 0.5 ML IM SUSY
0.5000 mL | PREFILLED_SYRINGE | INTRAMUSCULAR | Status: AC
  Administered 2020-06-14: 0.5 mL via INTRAMUSCULAR
  Filled 2020-06-13: qty 0.5

## 2020-06-13 MED ORDER — WHITE PETROLATUM EX OINT
TOPICAL_OINTMENT | CUTANEOUS | Status: AC
  Filled 2020-06-13: qty 5

## 2020-06-13 MED ORDER — LORAZEPAM 1 MG PO TABS
2.0000 mg | ORAL_TABLET | Freq: Three times a day (TID) | ORAL | Status: DC | PRN
  Administered 2020-06-13 – 2020-06-14 (×3): 2 mg via ORAL
  Filled 2020-06-13 (×4): qty 2
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
## 2020-06-13 NOTE — Progress Notes (Signed)
   06/13/20 2144  Psych Admission Type (Psych Patients Only)  Admission Status Involuntary  Psychosocial Assessment  Patient Complaints Anxiety  Eye Contact Fair  Facial Expression Animated  Affect Appropriate to circumstance;Anxious  Speech Logical/coherent  Interaction Assertive;Needy  Motor Activity Restless  Appearance/Hygiene Unremarkable  Behavior Characteristics Cooperative;Anxious  Mood Labile;Anxious  Aggressive Behavior  Effect No apparent injury  Thought Process  Coherency WDL  Content WDL  Delusions None reported or observed  Perception WDL  Hallucination None reported or observed  Judgment WDL  Confusion None  Danger to Self  Current suicidal ideation? Denies  Self-Injurious Behavior No self-injurious ideation or behavior indicators observed or expressed   Danger to Others  Danger to Others None reported or observed

## 2020-06-13 NOTE — Progress Notes (Signed)
   06/13/20 0615  Vital Signs  Pulse Rate 98  BP 132/83  BP Location Right Arm  BP Method Automatic  Patient Position (if appropriate) Standing   D: Patient denies SI/ HI/ AVH. Patient admitted to some anxiety and depression. Pt. Out in open areas and was social with peers and staff.  A:  Patient took scheduled medicine. Pt. Given 1 mg of ativan for anxiety and mylantaa for heartburn/idigestion.  Support and encouragement provided Routine safety checks conducted every 15 minutes. Patient  Informed to notify staff with any concerns.   R: Safety maintained.

## 2020-06-14 DIAGNOSIS — F3113 Bipolar disorder, current episode manic without psychotic features, severe: Secondary | ICD-10-CM | POA: Diagnosis not present

## 2020-06-14 DIAGNOSIS — K219 Gastro-esophageal reflux disease without esophagitis: Secondary | ICD-10-CM | POA: Diagnosis not present

## 2020-06-14 DIAGNOSIS — F17200 Nicotine dependence, unspecified, uncomplicated: Secondary | ICD-10-CM

## 2020-06-14 DIAGNOSIS — F1210 Cannabis abuse, uncomplicated: Secondary | ICD-10-CM | POA: Diagnosis not present

## 2020-06-14 DIAGNOSIS — E785 Hyperlipidemia, unspecified: Secondary | ICD-10-CM | POA: Diagnosis not present

## 2020-06-14 DIAGNOSIS — F4310 Post-traumatic stress disorder, unspecified: Secondary | ICD-10-CM
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
## 2020-06-14 NOTE — Progress Notes (Signed)
   06/14/20 2230  Psych Admission Type (Psych Patients Only)  Admission Status Involuntary  Psychosocial Assessment  Patient Complaints Anxiety  Eye Contact Fair  Facial Expression Animated;Worried  Affect Anxious  Systems analyst;Soft  Interaction Assertive  Motor Activity Fidgety  Appearance/Hygiene Unremarkable  Behavior Characteristics Cooperative;Appropriate to situation  Mood Anxious  Thought Process  Coherency WDL  Content Blaming others  Delusions WDL  Perception WDL  Hallucination None reported or observed  Judgment Poor  Confusion None  Danger to Self  Current suicidal ideation? Denies  Danger to Others  Danger to Others None reported or observed   She reported having a good day, rated it 8/10 (10 the best).  She states that she is planning on going home and getting her "ducks in a row" and finding her own place to live.  She was upset earlier in the shift because the day room was closed but was able to calm down without difficulty.  She took her hs medications without difficulty.  She denied any pain or discomfort and appeared to be in no physical distress.  Q 15 minute checks maintained for safety.  We will continue to monitor the progress towards her goals.

## 2020-06-14 NOTE — Progress Notes (Signed)
Progress note    06/14/20 0745  Psych Admission Type (Psych Patients Only)  Admission Status Involuntary  Psychosocial Assessment  Patient Complaints Anxiety  Eye Contact Fair  Facial Expression Animated;Anxious  Affect Anxious  Speech Logical/coherent  Interaction Assertive  Motor Activity Pacing;Restless  Appearance/Hygiene Unremarkable  Behavior Characteristics Cooperative;Appropriate to situation;Anxious  Mood Anxious;Pleasant  Thought Process  Coherency WDL  Content Blaming others  Delusions WDL  Perception WDL  Hallucination None reported or observed  Judgment Poor  Confusion None  Danger to Self  Current suicidal ideation? Denies  Self-Injurious Behavior No self-injurious ideation or behavior indicators observed or expressed   Danger to Others  Danger to Others None reported or observed  Danger to Others Abnormal  Harmful Behavior to others No threats or harm toward other people  Destructive Behavior No threats or harm toward property

## 2020-06-14 NOTE — Plan of Care (Signed)
  Problem: Education: Goal: Knowledge of [facility] Education information/materials will improve Outcome: Progressing Goal: Emotional status will improve Outcome: Progressing Goal: Mental status will improve Outcome: Progressing Goal: Verbalization of understanding the information provided will improve Outcome: Progressing   

## 2020-06-15 LAB — RESP PANEL BY RT-PCR (RSV, FLU A&B, COVID)  RVPGX2
Influenza A by PCR: NEGATIVE
Influenza B by PCR: NEGATIVE
Resp Syncytial Virus by PCR: NEGATIVE
SARS Coronavirus 2 by RT PCR: NEGATIVE

## 2020-06-15 MED ORDER — TRAZODONE HCL 50 MG PO TABS: 50.0000 mg | ORAL_TABLET | Freq: Every evening | ORAL | 0 refills | Status: DC | PRN

## 2020-06-15 MED ORDER — OXCARBAZEPINE 600 MG PO TABS: 600.0000 mg | ORAL_TABLET | Freq: Two times a day (BID) | ORAL | 0 refills | Status: DC

## 2020-06-15 MED ORDER — LORAZEPAM 1 MG PO TABS: 1.0000 mg | ORAL_TABLET | Freq: Two times a day (BID) | ORAL | 0 refills | Status: AC

## 2020-06-15 NOTE — [facility] Suicide Risk Assessment (Signed)
[facility] Discharge Suicide Risk Assessment   Principal Problem: Bipolar affective disorder, manic, severe (HCC) Discharge Diagnoses: Principal Problem:   Bipolar affective disorder, manic, severe (HCC) Active Problems:   GERD (gastroesophageal reflux disease)   HLD (hyperlipidemia)   PTSD (post-traumatic stress disorder)   Cannabis abuse   Nicotine use disorder   Total Time spent with patient: 20 minutes  Musculoskeletal: Strength & Muscle Tone: within normal limits Gait & Station: normal Patient leans: N/A  Psychiatric Specialty Exam: Review of Systems  Blood pressure 120/79, pulse 91, temperature 98.1 F (36.7 C), temperature source Oral, resp. rate 20, SpO2 97 %.There is no height or weight on file to calculate BMI.  General Appearance: Casual  Eye Contact::  Fair  Speech:  Clear and Coherent  Volume:  Normal  Mood:  Euthymic  Affect:  Appropriate  Thought Process:  Coherent  Orientation:  Full (Time, Place, and Person)  Thought Content:  Logical  Suicidal Thoughts:  No  Homicidal Thoughts:  No  Memory:  Recent;   Fair  Judgement:  Intact  Insight:  Fair  Psychomotor Activity:  Normal  Concentration:  Fair  Recall:  [employer] of Knowledge:Fair  Language: Fair  Akathisia:  No  Handed:  Right  AIMS (if indicated):     Assets:  Communication Skills Desire for Improvement Financial Resources/Insurance Housing Leisure Time Physical Health Resilience Social Support Talents/Skills  Sleep:  Number of Hours: 6  Cognition: WNL  ADL's:  Intact   Mental Status Per Nursing Assessment::   On Admission:  NA  Demographic Factors:  Caucasian  Loss Factors: Decline in physical health  Historical Factors: Impulsivity  Risk Reduction Factors:   Sense of responsibility to family and Positive social support  Continued Clinical Symptoms:  Bipolar disorder  Cognitive Features That Contribute To Risk:  None    Suicide Risk:  Minimal: No identifiable suicidal  ideation.  Patients presenting with no risk factors but with morbid ruminations; may be classified as minimal risk based on the severity of the depressive symptoms   Follow-up Information    [facility] Follow up on 07/07/2020.   Why: Appointment for medication management is scheduled for 07/07/2020 please contact your doctor to keep this appointment.  Thank you.  Contact information: [address] [place] [address]       [facility] Follow up on 06/22/2020.   Why: Therapy intake assessment is scheduled for 06/22/20 at 9am.  Please see Mrs. Jerelyn Charles for this appointment.  Contact information: [address] [place] [address] [phone]               Plan Of Care/Follow-up recommendations:  Other:  Follow-up with outpatient resources  Clement Sayres, MD 06/15/2020, 11:25 AM
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
## 2020-06-15 NOTE — Plan of Care (Signed)
Pt was able to focus on group tasks with minimal to no prompting from LRT at completion of recreation therapy group sessions.   Caroll Rancher, LRT/CTRS

## 2020-06-15 NOTE — Plan of Care (Signed)

## 2020-06-15 NOTE — Progress Notes (Signed)
Recreation Therapy Notes  Date: 1.10.22 Time: 1000 Location: [address]   Group Topic: Wellness  Goal Area(s) Addresses:  Patient will define components of whole wellness. Patient will verbalize benefit of whole wellness.  Intervention: Worksheets  ActivityHospital doctor.  Due to COVID precautions on unit, patients were given a packet that included worksheets to help with challenging anxious thoughts, setting life goals, positive steps to wellbeing, forgiveness and crossword puzzles/word searches.    Education: Wellness, Building control surveyor.   Education Outcome: Acknowledges education/In group clarification offered/Needs additional education.   Clinical Observations/Feedback:  Pt was given packet to work on throughout the day.     Caroll Rancher, LRT/CTRS         Caroll Rancher A 06/15/2020 12:34 PM

## 2020-06-15 NOTE — Progress Notes (Signed)
Recreation Therapy Notes  INPATIENT RECREATION TR PLAN  Patient Details Name: Kelly Carr MRN: [id number] DOB: [birth year] Today's Date: 06/15/2020  Rec Therapy Plan Is patient appropriate for Therapeutic Recreation?: Yes Treatment times per week: about 3 days Estimated Length of Stay: 5-7 days TR Treatment/Interventions: Group participation (Comment)  Discharge Criteria Pt will be discharged from therapy if:: Discharged Treatment plan/goals/alternatives discussed and agreed upon by:: Patient/family  Discharge Summary Short term goals set: See patient care plan Short term goals met: Complete Progress toward goals comments: Groups attended Which groups?: Communication,Other (Comment) (Team Building) Reason goals not met: None Therapeutic equipment acquired: N/A Reason patient discharged from therapy: Discharge from hospital Pt/family agrees with progress & goals achieved: Yes Date patient discharged from therapy: 06/15/20    Victorino Sparrow, LRT/CTRS  Ria Comment, Gibson Telleria A 06/15/2020, 12:06 PM

## 2020-06-15 NOTE — Progress Notes (Signed)
  [facility] Adult Case Management Discharge Plan :  Will you be returning to the same living situation after discharge:  Yes,  to her apartment At discharge, do you have transportation home?: Yes,  via boyfriend Do you have the ability to pay for your medications: Yes,  has income  Release of information consent forms completed and in the chart;  Patient's signature needed at discharge.  Patient to Follow up at:  Follow-up Information    [facility] Follow up on 07/07/2020.   Why: Appointment for medication management is scheduled for 07/07/2020 please contact your doctor to keep this appointment.  Thank you.  Contact information: [address], [address] [place] [address]       [facility] Follow up on 06/22/2020.   Why: Therapy intake assessment is scheduled for 06/22/20 at 9am.  Please see Mrs. Jerelyn Charles for this appointment.  Contact information: [address] [place] [address] [phone]               Next level of care provider has access to [facility] Link:no  Safety Planning and Suicide Prevention discussed: Yes,  w/ pt and pt's daughter  Have you used any form of tobacco in the last 30 days? (Cigarettes, Smokeless Tobacco, Cigars, and/or Pipes): Yes  Has patient been referred to the Quitline?: Patient refused referral  Patient has been referred for addiction treatment: N/A  Felizardo Hoffmann, LCSWA 06/15/2020, 9:50 AM

## 2020-06-15 NOTE — Discharge Summary (Signed)
Physician Discharge Summary Note  Patient:  Kelly Carr is an 50 y.o., adult MRN:  [id number] DOB:  04/30/1971 Patient phone:  [phone] (home)  Patient address:   [address] [place] [address],  Total Time spent with patient: 30 minutes  Date of Admission:  06/10/2020 Date of Discharge: 06/15/2020  Reason for Admission:  (from MD's admission note): Kelly Carr is a 50 yo femalewho [facility].She presents labile andtangential but cooperative. She has been in a tumultuous long-term relationship for about 1 1/2 years.Pt has a history ofBipolar Disorder, severe with psychotic features. She states she does not know why someone petitioned her for involuntary commitment.Ptdeniescurrent suicidal ideation.She report past attempts includeoverdose 09/24/19.   Patient was seen, chart reviewed and case discussed with treatment team. Patient's labile mood and pressured speech have improved since admission. She has improved insight and is forward thinking. She has a psychiatric history of Bipolar affective disorder, manic, severe and PTSD. She has a history of being medication non-compliant and then becomes delusional, with pressured speech and labile mood. She has had multiple ED and inpatient admissions for her Bipolar Mood disorder. Kelly Carr has shown improved mood, sleep and insight since her admission. She is taking her medications as prescribed without incident. She received her Abilify Maintenna 400 mg IM LAI injection on 06/12/2020, next injection is due on 07/10/2020. She denies suicidal and homicidal ideation, plan or intent. She denies auditory and visual hallucinations, delusions or paranoia. She has medication management with [facility] and will have therapy services at [facility]. She is calm and cooperative and stable for discharge home today.   Principal Problem: Bipolar affective disorder, manic, severe (HCC) Discharge Diagnoses: Principal  Problem:   Bipolar affective disorder, manic, severe (HCC) Active Problems:   GERD (gastroesophageal reflux disease)   HLD (hyperlipidemia)   PTSD (post-traumatic stress disorder)   Cannabis abuse   Nicotine use disorder   Past Psychiatric History: See admission H&P  Past Medical History:  Past Medical History:  Diagnosis Date  . Abnormal Pap smear    Unknown results>colpo>normal  . Anxiety   . Arthritis   . Asthma   . Bipolar 1 disorder (HCC)   . Depression     Past Surgical History:  Procedure Laterality Date  . NO PAST SURGERIES     Family History:  Family History  Problem Relation Age of Onset  . Diabetes Mother   . Hypertension Mother   . Heart disease Mother 30  . Schizophrenia Mother   . Diabetes Maternal Grandmother   . Heart disease Maternal Grandmother   . Diabetes Maternal Grandfather   . Heart disease Maternal Grandfather   . Bipolar disorder Cousin   . Bipolar disorder Nephew   . Depression Daughter    Family Psychiatric  History: See admission H&P Social History:  Social History   Substance and Sexual Activity  Alcohol Use Not Currently     Social History   Substance and Sexual Activity  Drug Use Not Currently  . Types: Marijuana   Comment: rare pot use    Social History   Socioeconomic History  . Marital status: Single    Spouse name: Not on file  . Number of children: Not on file  . Years of education: Not on file  . Highest education level: Not on file  Occupational History  . Not on file  Tobacco Use  . Smoking status: Current Some Day Smoker    Packs/day: 0.10  Types: E-cigarettes  . Smokeless tobacco: Never Used  Vaping Use  . Vaping Use: Every day  Substance and Sexual Activity  . Alcohol use: Not Currently  . Drug use: Not Currently    Types: Marijuana    Comment: rare pot use  . Sexual activity: Yes    Partners: Male    Birth control/protection: None  Other Topics Concern  . Not on file  Social History  Narrative  . Not on file   Social Determinants of Health   Financial Resource Strain: Not on file  Food Insecurity: Not on file  Transportation Needs: Not on file  Physical Activity: Not on file  Stress: Not on file  Social Connections: Not on file    Hospital Course:   Kelly Carr is a 50 year old female who was admitted, under IVC, for labile mood and pressured speech. She has a psychiatric history of PTSD and Bipolar affective disorder, severe, manic. She is frequently medication non-compliant. She has been receiving her Abilify Maintena monthly injections from [facility], per record review she received an injection on 11/17, no record of an injection in December. She did receive her Abilify injection on 1/7 during this hospitalization and was restarted on her Trileptal 600 mg BID for additional mood stabilization. Her labile mood and pressured speech have improved and she has been taking her medications as prescribed. She is sleeping and eating well. She denies SI/HI/AVH and does not appear to be having delusional or psychotic features. She has attended group therapy. Her stressors are her tumultuous relationship with her boyfriend of 1.5 years and her 46 year old daughter. She is calm and cooperative today and has remained safe with 15 minute safety checks. She has not needed any further behavioral interventions. She is stable for discharge home.    She remained on the [facility] unit for 4 days. She was started on, Abilify Maintena 400 mg IM LAI every 28 days, received on 06/12/2020, Trileptal 600 mg PO BID, Trazodone 50 mg PO at bedtime as needed. She was also placed on constipation regimen of metamucil and Miralax. She participated in group therapy on the unit. She responded well to treatment with no adverse effects reported. She has shown improved mood, affect, sleep, and interaction. She denies any SI/HI/AVH and contracts for safety. She is discharging on the medications listed  below. She agrees to follow up at [facility] for medication management and [facility] for therapy, appointments are listed below. Patient is provided with prescriptions for medications upon discharge. Kelly Carr is being picked up by boyfriend for discharge to home.   Physical Findings: AIMS: Facial and Oral Movements Muscles of Facial Expression: None, normal Lips and Perioral Area: None, normal Jaw: None, normal Tongue: None, normal,Extremity Movements Upper (arms, wrists, hands, fingers): None, normal Lower (legs, knees, ankles, toes): None, normal, Trunk Movements Neck, shoulders, hips: None, normal, Overall Severity Severity of abnormal movements (highest score from questions above): None, normal Incapacitation due to abnormal movements: None, normal Patient's awareness of abnormal movements (rate only patient's report): No Awareness, Dental Status Current problems with teeth and/or dentures?: No Does patient usually wear dentures?: No  CIWA:    COWS:     Musculoskeletal: Strength & Muscle Tone: within normal limits Gait & Station: normal Patient leans: N/A  Psychiatric Specialty Exam: Physical Exam Constitutional:      Appearance: Normal appearance.  HENT:     Head: Normocephalic and atraumatic.  Pulmonary:     Effort: Pulmonary effort is  normal.  Musculoskeletal:        General: Normal range of motion.     Cervical back: Normal range of motion.  Neurological:     General: No focal deficit present.     Mental Status: She is alert and oriented to person, place, and time.  Psychiatric:        Attention and Perception: Attention normal.        Mood and Affect: Mood normal.        Speech: Speech normal.        Behavior: Behavior normal. Behavior is cooperative.        Thought Content: Thought content normal.        Cognition and Memory: Cognition normal.     Review of Systems  Constitutional: Negative for activity change and appetite change.  Respiratory:  Negative for chest tightness and shortness of breath.   Cardiovascular: Negative for chest pain.  Gastrointestinal: Negative for abdominal pain.  Neurological: Negative for facial asymmetry and headaches.    Blood pressure 120/79, pulse 91, temperature 98.1 F (36.7 C), temperature source Oral, resp. rate 20, SpO2 97 %.There is no height or weight on file to calculate BMI.  General Appearance: Casual and Fairly Groomed  Eye Contact:  Fair  Speech:  Clear and Coherent and Normal Rate  Volume:  Normal  Mood:  Euthymic  Affect:  Appropriate and Congruent  Thought Process:  Coherent, Goal Directed and Descriptions of Associations: Intact  Orientation:  Full (Time, Place, and Person)  Thought Content:  Logical and Hallucinations: None  Suicidal Thoughts:  No  Homicidal Thoughts:  No  Memory:  Immediate;   Fair Recent;   Fair Remote;   Fair  Judgement:  Intact  Insight:  Fair  Psychomotor Activity:  Normal  Concentration:  Concentration: Fair and Attention Span: Fair  Recall:  [employer] of Knowledge:  Fair  Language:  Fair  Akathisia:  No  Handed:  Right  AIMS (if indicated):     Assets:  Communication Skills Desire for Improvement Financial Resources/Insurance Housing Physical Health Resilience Social Support Talents/Skills Vocational/Educational  ADL's:  Intact  Cognition:  WNL  Sleep:  Number of Hours: 6     Have you used any form of tobacco in the last 30 days? (Cigarettes, Smokeless Tobacco, Cigars, and/or Pipes): Yes  Has this patient used any form of tobacco in the last 30 days? (Cigarettes, Smokeless Tobacco, Cigars, and/or Pipes) Yes, Yes, Prescription not provided because: Patient declined  Blood Alcohol level:  Lab Results  Component Value Date   ETH <10 06/09/2020   ETH <10 05/13/2020    Metabolic Disorder Labs:  Lab Results  Component Value Date   HGBA1C 5.0 06/11/2020   MPG 96.8 06/11/2020   MPG 105.41 05/13/2020   Lab Results  Component Value  Date   PROLACTIN 39.5 (H) 04/12/2018   PROLACTIN 2.0 12/05/2011   Lab Results  Component Value Date   CHOL 230 (H) 06/11/2020   TRIG 109 06/11/2020   HDL 59 06/11/2020   CHOLHDL 3.9 06/11/2020   VLDL 22 06/11/2020   LDLCALC 149 (H) 06/11/2020   LDLCALC 135 (H) 05/13/2020    See Psychiatric Specialty Exam and Suicide Risk Assessment completed by Attending Physician prior to discharge.  Discharge destination:  Home  Is patient on multiple antipsychotic therapies at discharge:  Yes,   Do you recommend tapering to monotherapy for antipsychotics?  No   Has Patient had three or more failed trials of  antipsychotic monotherapy by history:  No  Recommended Plan for Multiple Antipsychotic Therapies: NA  Discharge Instructions    Diet - low sodium heart healthy   Complete by: As directed    Increase activity slowly   Complete by: As directed      Allergies as of 06/15/2020      Reactions   Doxycycline Itching   Gabapentin Other (See Comments)   Difficulty moving   Haldol [haloperidol Lactate] Other (See Comments)   Syncope   Risperidone And Related Other (See Comments)   Zones out   Tramadol Itching   Valproic Acid Swelling      Medication List    STOP taking these medications   hydrOXYzine 50 MG tablet Commonly known as: ATARAX/VISTARIL     TAKE these medications     Indication  ARIPiprazole ER 400 MG Srer injection Commonly known as: ABILIFY MAINTENA Inject 2 mLs (400 mg total) into the muscle every 28 (twenty-eight) days.  Indication: Manic-Depression   fluticasone 50 MCG/ACT nasal spray Commonly known as: FLONASE Place 2 sprays into both nostrils daily.  Indication: Allergic Rhinitis   ibuprofen 200 MG tablet Commonly known as: ADVIL Take 200 mg by mouth every 6 (six) hours as needed for headache or mild pain.  Indication: Pain   LORazepam 1 MG tablet Commonly known as: Ativan Take 1 tablet (1 mg total) by mouth 2 (two) times daily.  Indication:  Feeling Anxious   oxcarbazepine 600 MG tablet Commonly known as: TRILEPTAL Take 1 tablet (600 mg total) by mouth 2 (two) times daily. What changed:   medication strength  how much to take  Indication: Mood instability   traZODone 50 MG tablet Commonly known as: DESYREL Take 1 tablet (50 mg total) by mouth at bedtime as needed for sleep. What changed:   medication strength  how much to take  Indication: Trouble Sleeping       Follow-up Information    [facility] Follow up on 07/07/2020.   Why: Appointment for medication management is scheduled for 07/07/2020 please contact your doctor to keep this appointment.  Thank you.  Contact information: [address], [address] [place] [phone]       [facility] Follow up on 06/22/2020.   Why: Therapy intake assessment is scheduled for 06/22/20 at 9am.  Please see Mrs. Jerelyn CharlesFranis Gill for this appointment.  Contact information: [address] [place] [address] [phone]               Follow-up recommendations:  Activity:  as tolerated Diet:  Heart helathy  Comments:  Patient is instructed prior to discharge to:  Take all medications as prescribed by his/her mental healthcare provider. Report any adverse effects and or reactions from the medicines to his/her outpatient provider promptly. Patient has been instructed & cautioned: To not engage in alcohol and or illegal drug use while on prescription medicines. In the event of worsening symptoms, patient is instructed to call the crisis hotline, 911 and or go to the nearest ED for appropriate evaluation and treatment of symptoms. To follow-up with his/her primary care provider for your other medical issues, concerns and or health care needs.  Signed: Laveda AbbeLaurie Britton Joice Nazario, NP 06/15/2020, 12:15 PM

## 2020-06-15 NOTE — Progress Notes (Signed)
Adult Psychoeducational Group Note  Date:  06/15/2020 Time:  1:49 AM  Group Topic/Focus:  Wrap-Up Group:   The focus of this group is to help patients review their daily goal of treatment and discuss progress on daily workbooks.  Participation Level:  Did Not Attend  Participation Quality:  Did Not Attend  Affect:  Did Not Attend  Cognitive:  Did Not Attend  Insight: None  Engagement in Group:  Did Not Attend  Modes of Intervention:  Did Not Attend  Additional Comments:  Pt did not attend evening wrap up group tonight.  Felipa Furnace 06/15/2020, 1:49 AM

## 2020-06-15 NOTE — Plan of Care (Signed)
Discharge note  Patient verbalizes readiness for discharge. Follow up plan explained, AVS, Transition record and SRA given. Prescriptions and teaching provided. Belongings returned and signed for. Suicide safety plan completed and signed. Patient verbalizes understanding. Patient denies SI/HI and assures this Clinical research associate they will seek assistance should that change. Patient discharged to lobby to wait for ride.  Problem: Education: Goal: Knowledge of  General Education information/materials will improve Outcome: Adequate for Discharge Goal: Emotional status will improve Outcome: Adequate for Discharge Goal: Mental status will improve Outcome: Adequate for Discharge Goal: Verbalization of understanding the information provided will improve Outcome: Adequate for Discharge   Problem: Activity: Goal: Interest or engagement in activities will improve 06/15/2020 1356 by Raylene Miyamoto, RN Outcome: Adequate for Discharge 06/15/2020 [phone] by Raylene Miyamoto, RN Outcome: Progressing Goal: Sleeping patterns will improve 06/15/2020 1356 by Raylene Miyamoto, RN Outcome: Adequate for Discharge 06/15/2020 1638 by Raylene Miyamoto, RN Outcome: Progressing   Problem: Coping: Goal: Ability to verbalize frustrations and anger appropriately will improve 06/15/2020 1356 by Raylene Miyamoto, RN Outcome: Adequate for Discharge 06/15/2020 4536 by Raylene Miyamoto, RN Outcome: Progressing Goal: Ability to demonstrate self-control will improve 06/15/2020 1356 by Raylene Miyamoto, RN Outcome: Adequate for Discharge 06/15/2020 4680 by Raylene Miyamoto, RN Outcome: Progressing   Problem: Health Behavior/Discharge Planning: Goal: Identification of resources available to assist in meeting health care needs will improve Outcome: Adequate for Discharge Goal: Compliance with treatment plan for underlying cause of condition will improve Outcome: Adequate for Discharge   Problem: Physical  Regulation: Goal: Ability to maintain clinical measurements within normal limits will improve Outcome: Adequate for Discharge   Problem: Safety: Goal: Periods of time without injury will increase Outcome: Adequate for Discharge

## 2020-06-15 NOTE — [facility] Suicide Risk Assessment (Signed)
[facility] INPATIENT:  Family/Significant Other Suicide Prevention Education  Suicide Prevention Education:  Education Completed; Shelly Bombard,  (name of family member/significant other) has been identified by the patient as the family member/significant other with whom the patient will be residing, and identified as the person(s) who will aid the patient in the event of a mental health crisis (suicidal ideations/suicide attempt).  With written consent from the patient, the family member/significant other has been provided the following suicide prevention education, prior to the and/or following the discharge of the patient.  The suicide prevention education provided includes the following:  Suicide risk factors  Suicide prevention and interventions  National Suicide Hotline telephone number  [facility] assessment telephone number  [facility] Emergency Assistance 911  [facility] and/or Residential Mobile Crisis Unit telephone number  Request made of family/significant other to:  Remove weapons (e.g., guns, rifles, knives), all items previously/currently identified as safety concern.    Remove drugs/medications (over-the-counter, prescriptions, illicit drugs), all items previously/currently identified as a safety concern.  The family member/significant other verbalizes understanding of the suicide prevention education information provided.  The family member/significant other agrees to remove the items of safety concern listed above.  Daughter reports no safety concerns at this time.   Felizardo Hoffmann 06/15/2020, 9:51 AM

## 2020-06-17 ENCOUNTER — Ambulatory Visit: Payer: Self-pay | Admitting: Physician Assistant

## 2020-06-19 LAB — 10-HYDROXYCARBAZEPINE: Triliptal/MTB(Oxcarbazepin): 22 ug/mL (ref 10–35)

## 2020-06-30 ENCOUNTER — Telehealth: Payer: Self-pay | Admitting: Physician Assistant

## 2020-06-30 NOTE — Telephone Encounter (Signed)
noted 

## 2020-06-30 NOTE — Telephone Encounter (Signed)
I know she has had several admissions in the past couple of months but that's all.

## 2020-06-30 NOTE — Telephone Encounter (Signed)
Traci, I'm not aware of anything going on with her, are you?  I just wanted you to see this msg.

## 2020-06-30 NOTE — Telephone Encounter (Signed)
Next appt is 07/07/20. Kelly Carr left a phone message and this is what she said. I was able to flee [place] and now at a family service center in [place], [place]. Her phone number is [phone]

## 2020-07-07 ENCOUNTER — Ambulatory Visit: Payer: Self-pay | Admitting: Physician Assistant

## 2020-07-10 ENCOUNTER — Other Ambulatory Visit: Payer: Self-pay | Admitting: Physician Assistant

## 2020-07-10 ENCOUNTER — Telehealth: Payer: Self-pay | Admitting: Physician Assistant

## 2020-07-10 MED ORDER — HYDROXYZINE HCL 25 MG PO TABS: 25.0000 mg | ORAL_TABLET | Freq: Three times a day (TID) | ORAL | 0 refills | Status: DC | PRN

## 2020-07-10 NOTE — Telephone Encounter (Signed)
Pt called requesting Rx for Hydroxyzine to [facility] on [address] [place] . The shelter she is in will pay for it. Brother passed and she's trying to plan service. Apt 3/29

## 2020-07-10 NOTE — Telephone Encounter (Signed)
Prescription was sent

## 2020-07-13 ENCOUNTER — Telehealth: Payer: Self-pay | Admitting: Physician Assistant

## 2020-07-13 NOTE — Telephone Encounter (Signed)
Pt called requesting a rx for Hydroxyzine to be sent in at  [facility]  in [place] , [place]

## 2020-07-14 NOTE — Telephone Encounter (Signed)
That was sent in on 07/10/2020.

## 2020-07-20 ENCOUNTER — Ambulatory Visit: Payer: Self-pay

## 2020-07-21 ENCOUNTER — Other Ambulatory Visit: Payer: Self-pay

## 2020-07-21 ENCOUNTER — Ambulatory Visit: Payer: Self-pay

## 2020-07-21 ENCOUNTER — Ambulatory Visit (INDEPENDENT_AMBULATORY_CARE_PROVIDER_SITE_OTHER): Payer: Self-pay

## 2020-07-21 DIAGNOSIS — F3110 Bipolar disorder, current episode manic without psychotic features, unspecified: Secondary | ICD-10-CM

## 2020-07-21 MED ORDER — HYDROXYZINE HCL 25 MG PO TABS: 25.0000 mg | ORAL_TABLET | Freq: Three times a day (TID) | ORAL | 0 refills | Status: DC | PRN

## 2020-07-21 NOTE — Progress Notes (Signed)
Patient arrived for her monthly Abilify Maintena 400 mg injection, last injection was in the hospital in January and December she was also in [facility]. Patient's last injection here at the office was in November 2021. Patient reports a lot going on in her life, she had 2 brothers pass away recently, she just got engaged yesterday on Valentine's Day, she had what appeared to be a spider bite on her right lower arm, and many other things going on. Patient had rapid speech. She received her 400 mg injection in her upper left gluteal area intramuscular. Tolerated well. No complaints. Reminded her to set up her next injection in 28 days, around 08/18/2020 Patient's injection is supplied and stored at the office.  Patient requested a refill of Hydroxyzine to be sent to [facility] in [place]. Rx sent.  LOT [id number] EXP JUL 2023

## 2020-07-22 ENCOUNTER — Other Ambulatory Visit: Payer: Self-pay

## 2020-07-22 ENCOUNTER — Ambulatory Visit (INDEPENDENT_AMBULATORY_CARE_PROVIDER_SITE_OTHER): Payer: Self-pay | Admitting: Student in an Organized Health Care Education/Training Program

## 2020-07-22 DIAGNOSIS — J302 Other seasonal allergic rhinitis: Secondary | ICD-10-CM

## 2020-07-22 DIAGNOSIS — K219 Gastro-esophageal reflux disease without esophagitis: Secondary | ICD-10-CM

## 2020-07-22 DIAGNOSIS — L03113 Cellulitis of right upper limb: Secondary | ICD-10-CM

## 2020-07-22 DIAGNOSIS — L0390 Cellulitis, unspecified: Secondary | ICD-10-CM | POA: Insufficient documentation

## 2020-07-22 MED ORDER — LORATADINE 10 MG PO TABS: 10.0000 mg | ORAL_TABLET | Freq: Every day | ORAL | 1 refills | Status: DC

## 2020-07-22 MED ORDER — CEPHALEXIN 250 MG PO CAPS: 250.0000 mg | ORAL_CAPSULE | Freq: Four times a day (QID) | ORAL | 0 refills | Status: DC

## 2020-07-22 MED ORDER — OPCON-A 0.027-0.315 % OP SOLN: 1.0000 [drp] | Freq: Every day | OPHTHALMIC | 0 refills | Status: DC

## 2020-07-22 MED ORDER — CEPHALEXIN 250 MG PO CAPS: 250.0000 mg | ORAL_CAPSULE | Freq: Four times a day (QID) | ORAL | 0 refills | Status: AC

## 2020-07-22 NOTE — Patient Instructions (Addendum)
It was a pleasure to see you today!  To summarize our discussion for this visit:  opticon-A for itchy eyes, continue your lubricating drops as well  Prescribing claritin for seasonal allergies  Keflex for your cellulitis and please call back if not improving  Some additional health maintenance measures we should update are: Health Maintenance Due  Topic Date Due  . COVID-19 Vaccine (1) Never done  . COLONOSCOPY (Pts 45-17yrs Insurance coverage will need to be confirmed)  Never done  . TETANUS/TDAP  01/08/2019  .   Please return to our clinic to see me as needed.  Call the clinic at [phone] if your symptoms worsen or you have any concerns.   Thank you for allowing me to take part in your care,  Dr. Jamelle Rushing

## 2020-07-22 NOTE — Progress Notes (Signed)
   SUBJECTIVE:   CHIEF COMPLAINT / HPI: red bump on arm  Noticed "pimple like" bump on right arm when woke up yesterday. It was painful and pruritic. The pimple has popped and now has spreading redness and edema. The location feels warm. Did not see anything that caused the bump.  Has happened before in Different homes.  Got steroids and antibiotics previously.  Has not noticed any fever.  Put some hydrocortizone and it didn't help. No oral medications.  H/o Allergy to doxycycline   Seasonal allergies- Itchy eyes.  Using flonase Would like an oral medication  GERD- using tums which control symptoms well  BH- seeing psychiatry. Received abilify injection today  OBJECTIVE:   BP 112/80   Pulse (!) 105   Ht 5\' 4"  (1.626 m)   Wt 159 lb (72.1 kg)   LMP 04/06/2020 (Approximate)   SpO2 97%   BMI 27.29 kg/m   HR <100 on recheck by provider. Patient asymptomatic.  General: NAD, pleasant, able to participate in exam Extremities: WWP. Skin: warm and dry, right forearm- dry lesion without fluid collection or abscess is surrounded by tender and indurated erythema. Warm to the touch. Distal sensation, strength, and circulation intact. Please see clinical image for further detail Neuro: alert and oriented Psych: pressured and tangential speech   ASSESSMENT/PLAN:   Cellulitis x2 days and growing. Denies systemic symptoms. Likely from insect bite but did not identify the culprit.  Did not appreciate an abscess on exam. - outlined and measured- given return precautions - keflex x7 days  GERD (gastroesophageal reflux disease) Continue tums if they are effective Avoid triggers Return if worsens  Seasonal allergies Primary symptoms include pruritic nose and eyes Eye exam negative for injection or drainage.  - continue flonase daily - prescribed opcon-A, continue lubricating drops as well - claritin PRN     13/06/2019, DO [facility]

## 2020-07-22 NOTE — Assessment & Plan Note (Signed)
Continue tums if they are effective Avoid triggers Return if worsens

## 2020-07-22 NOTE — Assessment & Plan Note (Signed)
x2 days and growing. Denies systemic symptoms. Likely from insect bite but did not identify the culprit.  Did not appreciate an abscess on exam. - outlined and measured- given return precautions - keflex x7 days

## 2020-07-22 NOTE — Assessment & Plan Note (Signed)
Primary symptoms include pruritic nose and eyes Eye exam negative for injection or drainage.  - continue flonase daily - prescribed opcon-A, continue lubricating drops as well - claritin PRN

## 2020-07-30 ENCOUNTER — Emergency Department (HOSPITAL_COMMUNITY): Payer: Self-pay

## 2020-07-30 ENCOUNTER — Emergency Department (HOSPITAL_COMMUNITY)
Admission: EM | Admit: 2020-07-30 | Discharge: 2020-07-31 | Disposition: A | Discharge status: Other Acute Inpatient Hospital | Payer: Self-pay | Attending: Emergency Medicine | Admitting: Emergency Medicine

## 2020-07-30 ENCOUNTER — Encounter (HOSPITAL_COMMUNITY): Payer: Self-pay

## 2020-07-30 ENCOUNTER — Ambulatory Visit: Payer: Self-pay | Admitting: Family Medicine

## 2020-07-30 DIAGNOSIS — Z7951 Long term (current) use of inhaled steroids: Secondary | ICD-10-CM | POA: Insufficient documentation

## 2020-07-30 DIAGNOSIS — M25522 Pain in left elbow: Secondary | ICD-10-CM | POA: Insufficient documentation

## 2020-07-30 DIAGNOSIS — S161XXA Strain of muscle, fascia and tendon at neck level, initial encounter: Secondary | ICD-10-CM | POA: Insufficient documentation

## 2020-07-30 DIAGNOSIS — F17290 Nicotine dependence, other tobacco product, uncomplicated: Secondary | ICD-10-CM | POA: Insufficient documentation

## 2020-07-30 DIAGNOSIS — J45909 Unspecified asthma, uncomplicated: Secondary | ICD-10-CM | POA: Insufficient documentation

## 2020-07-30 DIAGNOSIS — M25562 Pain in left knee: Secondary | ICD-10-CM | POA: Insufficient documentation

## 2020-07-30 DIAGNOSIS — Z20822 Contact with and (suspected) exposure to covid-19: Secondary | ICD-10-CM | POA: Insufficient documentation

## 2020-07-30 MED ORDER — ACETAMINOPHEN 500 MG PO TABS
1000.0000 mg | ORAL_TABLET | Freq: Once | ORAL | Status: AC
  Administered 2020-07-30: 1000 mg via ORAL
  Filled 2020-07-30: qty 2
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
## 2020-07-30 NOTE — ED Notes (Signed)
TTS at bedside. 

## 2020-07-30 NOTE — ED Notes (Signed)
TTS complete 
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
## 2020-07-31 ENCOUNTER — Other Ambulatory Visit: Payer: Self-pay

## 2020-07-31 ENCOUNTER — Encounter (HOSPITAL_COMMUNITY): Payer: Self-pay

## 2020-07-31 ENCOUNTER — Ambulatory Visit (HOSPITAL_COMMUNITY)
Admission: EM | Admit: 2020-07-31 | Discharge: 2020-07-31 | Disposition: A | Discharge status: Home / Self Care | Payer: No Payment, Other | Attending: Urology | Admitting: Urology

## 2020-07-31 DIAGNOSIS — Z818 Family history of other mental and behavioral disorders: Secondary | ICD-10-CM | POA: Insufficient documentation

## 2020-07-31 DIAGNOSIS — F332 Major depressive disorder, recurrent severe without psychotic features: Secondary | ICD-10-CM | POA: Insufficient documentation

## 2020-07-31 DIAGNOSIS — F17290 Nicotine dependence, other tobacco product, uncomplicated: Secondary | ICD-10-CM | POA: Insufficient documentation

## 2020-07-31 DIAGNOSIS — Z8659 Personal history of other mental and behavioral disorders: Secondary | ICD-10-CM | POA: Insufficient documentation

## 2020-07-31 DIAGNOSIS — Z79899 Other long term (current) drug therapy: Secondary | ICD-10-CM | POA: Insufficient documentation

## 2020-07-31 LAB — COMPREHENSIVE METABOLIC PANEL
ALT: 30 U/L (ref 0–44)
AST: 21 U/L (ref 15–41)
Albumin: 3.9 g/dL (ref 3.5–5.0)
Alkaline Phosphatase: 70 U/L (ref 38–126)
Anion gap: 8 (ref 5–15)
BUN: 8 mg/dL (ref 6–20)
CO2: 25 mmol/L (ref 22–32)
Calcium: 9.5 mg/dL (ref 8.9–10.3)
Chloride: 104 mmol/L (ref 98–111)
Creatinine, Ser: 0.67 mg/dL (ref 0.44–1.00)
GFR, Estimated: >60 mL/min (ref >60)
Glucose, Bld: 107 mg/dL — ABNORMAL HIGH (ref 70–99)
Potassium: 3.9 mmol/L (ref 3.5–5.1)
Sodium: 137 mmol/L (ref 135–145)
Total Bilirubin: 0.8 mg/dL (ref 0.3–1.2)
Total Protein: 6.9 g/dL (ref 6.5–8.1)

## 2020-07-31 LAB — CBC WITH DIFFERENTIAL/PLATELET
Abs Immature Granulocytes: 0.04 10*3/uL (ref 0.00–0.07)
Basophils Absolute: 0 10*3/uL (ref 0.0–0.1)
Basophils Relative: 0 %
Eosinophils Absolute: 0.1 10*3/uL (ref 0.0–0.5)
Eosinophils Relative: 1 %
HCT: 40.2 % (ref 36.0–46.0)
Hemoglobin: 12.7 g/dL (ref 12.0–15.0)
Immature Granulocytes: 0 %
Lymphocytes Relative: 30 %
Lymphs Abs: 2.7 10*3/uL (ref 0.7–4.0)
MCH: 27.6 pg (ref 26.0–34.0)
MCHC: 31.6 g/dL (ref 30.0–36.0)
MCV: 87.4 fL (ref 80.0–100.0)
Monocytes Absolute: 0.8 10*3/uL (ref 0.1–1.0)
Monocytes Relative: 9 %
Neutro Abs: 5.4 10*3/uL (ref 1.7–7.7)
Neutrophils Relative %: 60 %
Platelets: 351 10*3/uL (ref 150–400)
RBC: 4.6 MIL/uL (ref 3.87–5.11)
RDW: 13.7 % (ref 11.5–15.5)
WBC: 9.1 10*3/uL (ref 4.0–10.5)
nRBC: 0 % (ref 0.0–0.2)

## 2020-07-31 LAB — LIPID PANEL
Cholesterol: 256 mg/dL — ABNORMAL HIGH (ref 0–200)
HDL: 58 mg/dL (ref >40)
LDL Cholesterol: 187 mg/dL — ABNORMAL HIGH (ref 0–99)
Total CHOL/HDL Ratio: 4.4 RATIO
Triglycerides: 54 mg/dL (ref <150)
VLDL: 11 mg/dL (ref 0–40)

## 2020-07-31 LAB — RESP PANEL BY RT-PCR (FLU A&B, COVID) ARPGX2
Influenza A by PCR: NEGATIVE
Influenza B by PCR: NEGATIVE
SARS Coronavirus 2 by RT PCR: NEGATIVE

## 2020-07-31 LAB — HEMOGLOBIN A1C
Hgb A1c MFr Bld: 5.4 % (ref 4.8–5.6)
Mean Plasma Glucose: 108.28 mg/dL

## 2020-07-31 LAB — ETHANOL: Alcohol, Ethyl (B): <10 mg/dL (ref <10)

## 2020-07-31 LAB — TSH: TSH: 2.095 u[IU]/mL (ref 0.350–4.500)

## 2020-07-31 MED ORDER — MAGNESIUM HYDROXIDE 400 MG/5ML PO SUSP: 30.0000 mL | Freq: Every day | ORAL | Status: DC | PRN

## 2020-07-31 MED ORDER — TRAZODONE HCL 50 MG PO TABS: 50.0000 mg | ORAL_TABLET | Freq: Every evening | ORAL | Status: DC | PRN

## 2020-07-31 MED ORDER — ALUM & MAG HYDROXIDE-SIMETH 200-200-20 MG/5ML PO SUSP: 30.0000 mL | ORAL | Status: DC | PRN

## 2020-07-31 MED ORDER — ACETAMINOPHEN 325 MG PO TABS
650.0000 mg | ORAL_TABLET | Freq: Four times a day (QID) | ORAL | Status: DC | PRN
  Administered 2020-07-31: 650 mg via ORAL
  Filled 2020-07-31: qty 2

## 2020-07-31 MED ORDER — HYDROXYZINE HCL 25 MG PO TABS
25.0000 mg | ORAL_TABLET | Freq: Three times a day (TID) | ORAL | Status: DC | PRN
  Administered 2020-07-31: 25 mg via ORAL
  Filled 2020-07-31: qty 1

## 2020-07-31 MED ORDER — LOPERAMIDE HCL 2 MG PO CAPS
2.0000 mg | ORAL_CAPSULE | Freq: Once | ORAL | Status: AC
  Administered 2020-07-31: 2 mg via ORAL
  Filled 2020-07-31: qty 1

## 2020-07-31 MED ORDER — OXCARBAZEPINE 300 MG PO TABS
600.0000 mg | ORAL_TABLET | Freq: Two times a day (BID) | ORAL | Status: DC
  Administered 2020-07-31: 600 mg via ORAL
  Filled 2020-07-31: qty 2

## 2020-07-31 NOTE — Discharge Instructions (Addendum)
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
## 2020-07-31 NOTE — ED Notes (Signed)
Discharge instructions given and Pt stated understanding. Personal belongings returned from locker. Pt alert, orient and ambulatory. Safety maintained.
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
## 2020-07-31 NOTE — ED Provider Notes (Signed)
Patient rechecked, no acute problems.  Chart reviewed.  Imaging was negative earlier.  She continues to have nonspecific pain, treated with Tylenol and improved.  Patient with nonspecific past medical history, no labs were performed earlier, but patient does not require labs.  Examination sufficient, patient medically clear for psychiatric treatment.   Kelly Crease, MD 07/31/20 [phone]

## 2020-07-31 NOTE — ED Notes (Signed)
Patient rested on pull out through early morning, in no acute distress. Safety maintained.

## 2020-07-31 NOTE — ED Notes (Signed)
Pt given meal

## 2020-07-31 NOTE — Progress Notes (Signed)
CSW met with patient in private setting at her request. Patient requested [facility] shelter options. CSW is familiar with patient from previous psychiatric inpatient admissions at [facility]. During last inpt stay, patient was placed at [facility] which she is no longer able to return to based due to unspecified conflict with staff. Patient is also barred from returning to [place] for 18-month, per patient report. CSW encouraged patient to call [facility] and complete interview.  Signed:  MDurenda Hurt MSW, LHope LCASA 07/31/2020 10:22 AM

## 2020-07-31 NOTE — ED Provider Notes (Signed)
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 04/02/2535 37.1  36.0 - 46.0 % Final  . MCV 05/13/2020 85.7  80.0 - 100.0 fL Final  . MCH 05/13/2020 28.6  26.0 - 34.0 pg Final  . MCHC 05/13/2020 33.4  30.0 - 36.0 g/dL Final  . RDW 64/40/3474 13.0  11.5 - 15.5 % Final  . Platelets 05/13/2020 335  150 - 400 K/uL Final  . nRBC 05/13/2020 0.0  0.0 - 0.2 % Final   Performed at [facility], [address], [place], [place] [address]  . Tricyclic, Ur Screen 05/13/2020 NONE DETECTED  NONE DETECTED Final  . Amphetamines, Ur Screen 05/13/2020 NONE DETECTED  NONE DETECTED Final  . MDMA (Ecstasy)Ur Screen 05/13/2020 NONE DETECTED  NONE DETECTED Final  . Cocaine Metabolite,Ur [place] 05/13/2020 NONE DETECTED  NONE DETECTED Final  . Opiate, Ur  Screen 05/13/2020 NONE DETECTED  NONE DETECTED Final  . Phencyclidine (PCP) Ur S 05/13/2020 NONE DETECTED  NONE DETECTED Final  . Cannabinoid 50 Ng, Ur [place] 05/13/2020 POSITIVE* NONE DETECTED Final  . Barbiturates, Ur Screen 05/13/2020 NONE DETECTED  NONE DETECTED Final  . Benzodiazepine, Ur Scrn 05/13/2020 NONE DETECTED  NONE DETECTED Final  . Methadone Scn, Ur 05/13/2020 NONE DETECTED  NONE DETECTED Final   Comment: (NOTE) Tricyclics + metabolites, urine    Cutoff 1000 ng/mL Amphetamines + metabolites, urine  Cutoff 1000 ng/mL MDMA (Ecstasy), urine              Cutoff 500 ng/mL Cocaine Metabolite, urine          Cutoff 300 ng/mL Opiate + metabolites, urine        Cutoff 300 ng/mL Phencyclidine (PCP), urine         Cutoff 25 ng/mL Cannabinoid, urine                 Cutoff 50 ng/mL Barbiturates + metabolites, urine  Cutoff 200 ng/mL Benzodiazepine, urine              Cutoff 200 ng/mL Methadone, urine                   Cutoff 300 ng/mL  The urine drug screen provides only a preliminary, unconfirmed analytical test result and should not be used for non-medical purposes. Clinical consideration and professional  judgment should be applied to any positive drug screen result due to possible interfering substances. A more specific alternate chemical method must be used in order to obtain a confirmed analytical result. Gas chromatography / mass spectrometry (GC/MS) is the preferred confirm                          atory method. Performed at [facility], [address] [place], [place] [address]   . Preg Test, Ur 05/13/2020 NEGATIVE  NEGATIVE Final   Performed at [facility], [address] [place] [place], [place] [address]  . Cholesterol 05/13/2020 207* 0 - 200 mg/dL Final  . Triglycerides 05/13/2020 56  <150 mg/dL Final  . HDL 81/19/1478 61  >40 mg/dL Final  . Total CHOL/HDL Ratio 05/13/2020 3.4  RATIO Final  . VLDL 05/13/2020 11  0 - 40 mg/dL Final  . LDL Cholesterol 05/13/2020 135* 0 - 99 mg/dL Final   Comment:        Total Cholesterol/HDL:CHD Risk Coronary Heart Disease Risk Table                     Men   Women  1/2 Average Risk   3.4   3.3  Average Risk       5.0   4.4  2 X Average Risk   9.6   7.1  3 X Average Risk  23.4   11.0        Use the calculated Patient Ratio above and the CHD Risk Table to determine the patient's CHD Risk.        ATP III CLASSIFICATION (LDL):  <100     mg/dL   Optimal  295-621  mg/dL   Near or Above                    Optimal  130-159  mg/dL   Borderline  308-657  mg/dL   High  >846     mg/dL   Very High  Performed at [facility], [address] [place], [place] [address]   . Hgb A1c MFr Bld 05/13/2020 5.3  4.8 - 5.6 % Final   Comment: (NOTE) Pre diabetes:          5.7%-6.4%  Diabetes:              >6.4%  Glycemic control for   <7.0% adults with diabetes   . Mean Plasma Glucose 05/13/2020 105.41  mg/dL Final   Performed at [facility], [address] [place], [place] [address]  . SARS Coronavirus 2 by RT PCR 05/13/2020 NEGATIVE  NEGATIVE Final   Comment: (NOTE) SARS-CoV-2 target nucleic acids are NOT  DETECTED.  The SARS-CoV-2 RNA is generally detectable in upper respiratory specimens during the acute phase of infection. The lowest concentration of SARS-CoV-2 viral copies this assay can detect is 138 copies/mL. A negative result does not preclude SARS-Cov-2 infection and should not be used as the sole basis for treatment or other patient management decisions. A negative result may occur with  improper specimen collection/handling, submission of specimen other than nasopharyngeal swab, presence of viral mutation(s) within the areas targeted by this assay, and inadequate number of viral copies(<138 copies/mL). A negative result must be combined with clinical observations, patient history, and epidemiological information. The expected result is Negative.  Fact Sheet for Patients:  BloggerCourse.com  Fact Sheet for Healthcare Providers:  SeriousBroker.it  This test is no                          t yet approved or cleared by the [place] FDA and  has been authorized for detection and/or diagnosis of SARS-CoV-2 by FDA under an Emergency Use Authorization (EUA). This EUA will remain  in effect (meaning this test can be used) for the duration of the COVID-19 declaration under Section 564(b)(1) of the Act, 21 U.S.C.section 360bbb-3(b)(1), unless the authorization is terminated  or revoked sooner.      . Influenza A by PCR 05/13/2020 NEGATIVE  NEGATIVE Final  . Influenza B by PCR 05/13/2020 NEGATIVE  NEGATIVE Final   Comment: (NOTE) The Xpert Xpress SARS-CoV-2/FLU/RSV plus assay is intended as an aid in the diagnosis of influenza from Nasopharyngeal swab specimens and should not be used as a sole basis for treatment. Nasal washings and aspirates are unacceptable for Xpert Xpress SARS-CoV-2/FLU/RSV testing.  Fact Sheet for Patients: BloggerCourse.com  Fact Sheet for Healthcare  Providers: SeriousBroker.it  This test is not yet approved or cleared by the [place] FDA and has been authorized for detection and/or diagnosis of SARS-CoV-2 by FDA under an Emergency Use Authorization (EUA). This EUA will remain in effect (meaning this test can be used) for the duration of the COVID-19 declaration under Section 564(b)(1) of the Act, 21 U.S.C. section 360bbb-3(b)(1), unless the authorization is terminated or revoked.  Performed at [facility], [address] [place], [place] [address]   Admission on 04/25/2020, Discharged on 04/25/2020  Component Date Value Ref Range Status  . SARS Coronavirus 2 by RT PCR 04/25/2020 NEGATIVE  NEGATIVE Final   Comment: (NOTE) SARS-CoV-2 target nucleic acids are NOT DETECTED.  The SARS-CoV-2 RNA is generally detectable in upper respiratoy specimens during the acute phase of infection. The lowest concentration of SARS-CoV-2 viral copies this assay can detect is 131 copies/mL. A negative result does not preclude SARS-Cov-2 infection and should not be used as the sole basis for treatment or other patient  management decisions. A negative result may occur with  improper specimen collection/handling, submission of specimen other than nasopharyngeal swab, presence of viral mutation(s) within the areas targeted by this assay, and inadequate number of viral copies (<131 copies/mL). A negative result must be combined with clinical observations, patient history, and epidemiological information. The expected result is Negative.  Fact Sheet for Patients:  https://www.moore.com/  Fact Sheet for Healthcare Providers:  https://www.young.biz/  This test is no                          t yet approved or cleared by the [place] FDA and  has been authorized for detection and/or diagnosis of SARS-CoV-2 by FDA under an Emergency Use Authorization (EUA). This EUA  will remain  in effect (meaning this test can be used) for the duration of the COVID-19 declaration under Section 564(b)(1) of the Act, 21 U.S.C. section 360bbb-3(b)(1), unless the authorization is terminated or revoked sooner.    . Influenza A by PCR 04/25/2020 NEGATIVE  NEGATIVE Final  . Influenza B by PCR 04/25/2020 NEGATIVE  NEGATIVE Final   Comment: (NOTE) The Xpert Xpress SARS-CoV-2/FLU/RSV assay is intended as an aid in  the diagnosis of influenza from Nasopharyngeal swab specimens and  should not be used as a sole basis for treatment. Nasal washings and  aspirates are unacceptable for Xpert Xpress SARS-CoV-2/FLU/RSV  testing.  Fact Sheet for Patients: https://www.moore.com/  Fact Sheet for Healthcare Providers: https://www.young.biz/  This test is not yet approved or cleared by the [place] FDA and  has been authorized for detection and/or diagnosis of SARS-CoV-2 by  FDA under an Emergency Use Authorization (EUA). This EUA will remain  in effect (meaning this test can be used) for the duration of the  Covid-19 declaration under Section 564(b)(1) of the Act, 21  U.S.C. section 360bbb-3(b)(1), unless the authorization is  terminated or revoked. Performed at [facility], [address], [place], [place] [address]   Lab on 03/16/2020  Component Date Value Ref Range Status  . SARS-CoV-2, NAA 03/16/2020 Not Detected  Not Detected Final   Comment: This nucleic acid amplification test was developed and its performance characteristics determined by [employer]. Nucleic acid amplification tests include RT-PCR and TMA. This test has not been FDA cleared or approved. This test has been authorized by FDA under an Emergency Use Authorization (EUA). This test is only authorized for the duration of time the declaration that circumstances exist justifying the authorization of the emergency use of in vitro diagnostic tests for  detection of SARS-CoV-2 virus and/or diagnosis of COVID-19 infection under section 564(b)(1) of the Act, 21 U.S.C. 725DGU-4(Q) (1), unless the authorization is terminated or revoked sooner. When diagnostic testing is negative, the possibility of a false negative result should be considered in the context of a patient's recent exposures and the presence of clinical signs and symptoms consistent with COVID-19. An individual without symptoms of COVID-19 and who is not shedding SARS-CoV-2 virus wo                          uld expect to have a negative (not detected) result in this assay.   Office Visit on 02/19/2020  Component Date Value Ref Range Status  . TSH 02/19/2020 1.210  0.450 - 4.500 uIU/mL Final  Admission on 02/14/2020, Discharged on 02/14/2020  Component Date Value Ref Range Status  . Preg Test, Ur 02/14/2020 NEGATIVE  NEGATIVE Final   Comment:        THE SENSITIVITY OF THIS METHODOLOGY IS >24 mIU/mL   . Sodium 02/14/2020 137  135 - 145 mmol/L Final  . Potassium 02/14/2020 3.9  3.5 - 5.1 mmol/L Final  . Chloride 02/14/2020 102  98 - 111 mmol/L Final  . CO2 02/14/2020 28  22 - 32 mmol/L Final  . Glucose, Bld 02/14/2020 95  70 - 99 mg/dL Final   Glucose reference range applies only to samples taken after fasting for at least 8 hours.  . BUN 02/14/2020 10  6 - 20 mg/dL Final  . Creatinine, Ser 02/14/2020 0.58  0.44 - 1.00 mg/dL Final  . Calcium 09/38/1829 8.9  8.9 - 10.3 mg/dL Final  . GFR calc non Af Amer 02/14/2020 >60  >60 mL/min Final  . GFR calc Af Amer 02/14/2020 >60  >60 mL/min Final  . Anion gap 02/14/2020 7  5 - 15 Final   Performed at [facility], [address], [place], [place] [address]  . WBC 02/14/2020 9.0  4.0 - 10.5 K/uL Final  . RBC 02/14/2020 4.22  3.87 - 5.11 MIL/uL Final  . Hemoglobin 02/14/2020 12.3  12.0 - 15.0 g/dL Final  . HCT 96/78/9381 37.1  36.0 - 46.0 % Final  . MCV 02/14/2020 87.9  80.0 - 100.0 fL Final  . MCH 02/14/2020 29.1  26.0  - 34.0 pg Final  . MCHC 02/14/2020 33.2  30.0 - 36.0 g/dL Final  . RDW 01/75/1025 13.5  11.5 - 15.5 % Final  . Platelets 02/14/2020 252  150 - 400 K/uL Final  . nRBC 02/14/2020 0.0  0.0 - 0.2 % Final   Performed at [facility], [address], [place], [place] [address]  . Color, Urine 02/14/2020 YELLOW* YELLOW Final  . APPearance 02/14/2020 HAZY* CLEAR Final  . Specific Gravity, Urine 02/14/2020 1.010  1.005 - 1.030 Final  . pH 02/14/2020 7.0  5.0 - 8.0 Final  . Glucose, UA 02/14/2020 NEGATIVE  NEGATIVE mg/dL Final  . Hgb urine dipstick 02/14/2020 NEGATIVE  NEGATIVE Final  . Bilirubin Urine 02/14/2020 NEGATIVE  NEGATIVE Final  . Ketones, ur 02/14/2020 NEGATIVE  NEGATIVE mg/dL Final  . Protein, ur 82/42/3536 NEGATIVE  NEGATIVE mg/dL Final  . Nitrite 14/43/1540 NEGATIVE  NEGATIVE Final  . Glori Luis 02/14/2020 NEGATIVE  NEGATIVE Final  . RBC / HPF 02/14/2020 0-5  0 - 5 RBC/hpf Final  . WBC, UA 02/14/2020 6-10  0 - 5 WBC/hpf Final  . Bacteria, UA 02/14/2020 NONE SEEN  NONE SEEN Final  . Squamous Epithelial / LPF 02/14/2020 6-10  0 - 5 Final   Performed at [facility], [address] [place], [place] [address]    Allergies: Doxycycline, Gabapentin, Haldol [haloperidol lactate], Risperidone and related, Tramadol, and Valproic acid  PTA Medications: (Not in a hospital admission)   Medical Decision Making  Obtain labs Admit patient to [facility] for continuous assessement and stabilization    Recommendations  Based on my evaluation the patient does not appear to have an emergency medical condition.  Maricela Bo, NP 07/31/20  6:09 AM

## 2020-08-18 ENCOUNTER — Other Ambulatory Visit: Payer: Self-pay

## 2020-08-18 ENCOUNTER — Ambulatory Visit (INDEPENDENT_AMBULATORY_CARE_PROVIDER_SITE_OTHER): Payer: Self-pay

## 2020-08-18 DIAGNOSIS — F3110 Bipolar disorder, current episode manic without psychotic features, unspecified: Secondary | ICD-10-CM

## 2020-08-23 NOTE — Progress Notes (Signed)
Patient arrived for her monthly Abilify Maintena 400 mg injection, she received her injection in her upper right gluteal area intramuscular. Tolerated well. No complaints. Reminded her to set up her next injection in 28 days, around 09/18/2020 Patient's injection is supplied and stored at the office.  Pt mentioned she needed [name] to write a letter about her care to take with her to court but she would have to call back with the information and dates that needed to be on the letter.   LOT [id number] EXP JUL 2023

## 2020-08-26 ENCOUNTER — Ambulatory Visit: Payer: 59 | Admitting: Psychiatry

## 2020-09-01 ENCOUNTER — Encounter: Payer: Self-pay | Admitting: Physician Assistant

## 2020-09-01 ENCOUNTER — Other Ambulatory Visit: Payer: Self-pay

## 2020-09-01 ENCOUNTER — Ambulatory Visit (INDEPENDENT_AMBULATORY_CARE_PROVIDER_SITE_OTHER): Payer: 59 | Admitting: Physician Assistant

## 2020-09-01 DIAGNOSIS — F319 Bipolar disorder, unspecified: Secondary | ICD-10-CM | POA: Diagnosis not present

## 2020-09-01 DIAGNOSIS — F4310 Post-traumatic stress disorder, unspecified: Secondary | ICD-10-CM | POA: Diagnosis not present

## 2020-09-01 DIAGNOSIS — F4321 Adjustment disorder with depressed mood: Secondary | ICD-10-CM

## 2020-09-01 NOTE — Progress Notes (Signed)
[facility] Med Check  Patient ID: Kelly Carr,  MRN: [id number]  PCP: Leeroy Bock, DO  Date of Evaluation: 09/01/2020 Time spent:20 minutes  Chief Complaint:  Chief Complaint    Follow-up     Virtual Visit via Telehealth  I connected with patient by a video enabled telemedicine application with their informed consent, and verified patient privacy and that I am speaking with the correct person using two identifiers.  I am private, in my office and the patient is at home.  I discussed the limitations, risks, security and privacy concerns of performing an evaluation and management service by video and the availability of in person appointments. I also discussed with the patient that there may be a patient responsible charge related to this service. The patient expressed understanding and agreed to proceed.   I discussed the assessment and treatment plan with the patient. The patient was provided an opportunity to ask questions and all were answered. The patient agreed with the plan and demonstrated an understanding of the instructions.   The patient was advised to call back or seek an in-person evaluation if the symptoms worsen or if the condition fails to improve as anticipated.  I provided 20 minutes of non-face-to-face time during this encounter.  HISTORY/CURRENT STATUS: HPI for routine med check.  She has had a lot things happen in the past 3 or 4 months. She and her boyfriend broke up and Jeffrey did not have any place to go, lives on the street which was extremely difficult.  Then her youngest brother died in [name] 29, 2022 from CHF. Older brother died in 07/30/22 from a heart attack.  "It's been hard."  She and her boyfriend are back together now and are looking for a counselor.  Feels like the Abilify Roderic Palau is working really well.  States is the only thing that seems to make a difference and out of all the medication she is taken in the past.  She is not working, laid off back  last fall.  Does not want to look for a job just yet because she has several upcoming court dates, not sure when they are.  She may need a letter written from me reporting her mental health issues.  She will get that information to me.  (See further history and previous notes.)  He is able to enjoy things some, denies decreased energy or motivation.  Appetite has not changed.  No extreme sadness, tearfulness, or feelings of hopelessness.  Denies any changes in concentration, making decisions or remembering things.  Denies suicidal or homicidal thoughts.  Patient denies increased energy with decreased need for sleep, no increased talkativeness, no racing thoughts, no impulsivity or risky behaviors, no increased spending, no increased libido, no grandiosity.  No increased irritability, no hallucinations, no paranoia.   Denies seizures, numbness, tingling, tremor, tics, unsteady gait, slurred speech, confusion. Denies muscle or joint pain, stiffness, or dystonia.  Individual Medical History/ Review of Systems: Changes? :No    Past medications for mental health diagnoses include: Gabapentin caused lethargy, Ativan, BuSpar, Wellbutrin, Latuda, trazodone, Cogentin, Geodon, Trileptal, Vraylar, prazosin, Vistaril, Abilify maintainena,Abilify  Allergies: Doxycycline, Gabapentin, Haldol [haloperidol lactate], Risperidone and related, Tramadol, and Valproic acid  Current Medications:  Current Outpatient Medications:  .  ARIPiprazole ER (ABILIFY MAINTENA) 400 MG SRER injection, Inject 2 mLs (400 mg total) into the muscle every 28 (twenty-eight) days., Disp: 1 each, Rfl: 3 .  acetaminophen (TYLENOL) 650 MG CR tablet, Take 1,300 mg by mouth every 8 (eight) hours  as needed for pain. (Patient not taking: Reported on 09/01/2020), Disp: , Rfl:  .  Cyanocobalamin (VITAMIN B 12) 500 MCG TABS, Take 500 mcg by mouth daily. (Patient not taking: Reported on 09/01/2020), Disp: , Rfl:  .  fluticasone (FLONASE) 50 MCG/ACT  nasal spray, Place 2 sprays into both nostrils daily. (Patient not taking: Reported on 09/01/2020), Disp: 9.9 mL, Rfl: 2 .  hydrOXYzine (ATARAX/VISTARIL) 25 MG tablet, Take 1 tablet (25 mg total) by mouth 3 (three) times daily as needed. (Patient not taking: Reported on 09/01/2020), Disp: 75 tablet, Rfl: 0 .  loratadine (CLARITIN) 10 MG tablet, Take 1 tablet (10 mg total) by mouth daily. (Patient not taking: Reported on 09/01/2020), Disp: 30 tablet, Rfl: 1 .  Multiple Vitamin (MULTIVITAMIN WITH MINERALS) TABS tablet, Take 1 tablet by mouth daily. Women's One A Day (Patient not taking: Reported on 09/01/2020), Disp: , Rfl:  .  Naphazoline-Pheniramine (OPCON-A) 0.027-0.315 % SOLN, Apply 1 drop to eye daily. (Patient not taking: Reported on 09/01/2020), Disp: 15 mL, Rfl: 0 .  Oxcarbazepine (TRILEPTAL) 600 MG tablet, Take 1 tablet (600 mg total) by mouth 2 (two) times daily., Disp: 30 tablet, Rfl: 0 .  traZODone (DESYREL) 50 MG tablet, Take 1 tablet (50 mg total) by mouth at bedtime as needed for sleep., Disp: 30 tablet, Rfl: 0 Medication Side Effects: none  Family Medical/ Social History: Changes? Yes see HPI  MENTAL HEALTH EXAM:  There were no vitals taken for this visit.There is no height or weight on file to calculate BMI.  General Appearance: Casual, Neat and Well Groomed  Eye Contact:  Good  Speech:  Clear and Coherent and Normal Rate  Volume:  Normal  Mood:  seems 'blue' today.  Affect:  Appropriate  Thought Process:  Goal Directed and Descriptions of Associations: Intact  Orientation:  Full (Time, Place, and Person)  Thought Content: Logical   Suicidal Thoughts:  No  Homicidal Thoughts:  No  Memory:  WNL  Judgement:  Good  Insight:  Good  Psychomotor Activity:  Normal  Concentration:  Concentration: Good and Attention Span: Good  Recall:  Good  Fund of Knowledge: Good  Language: Good  Assets:  Desire for Improvement  ADL's:  Intact  Cognition: WNL  Prognosis:  Good    DIAGNOSES:     ICD-10-CM   1. Bipolar I disorder (HCC)  F31.9   2. PTSD (post-traumatic stress disorder)  F43.10   3. Grief  F43.21     Receiving Psychotherapy: No     RECOMMENDATIONS:  PDMP was reviewed.  I provided 20 minutes of face-to-face time during this encounter including time spent before and after the visit and record to review and charting. My condolences for her losses. She will get me the information that is needed for her court dates. Continue Abilify Maintena 400 mg IM q. 28 days. Return in 3 months.  Melony Overly, PA-C

## 2020-09-17 ENCOUNTER — Other Ambulatory Visit: Payer: Self-pay | Admitting: Obstetrics & Gynecology

## 2020-09-17 ENCOUNTER — Other Ambulatory Visit: Payer: Self-pay

## 2020-09-17 ENCOUNTER — Encounter: Payer: Self-pay | Admitting: Obstetrics & Gynecology

## 2020-09-17 ENCOUNTER — Other Ambulatory Visit (HOSPITAL_COMMUNITY)
Admission: RE | Admit: 2020-09-17 | Discharge: 2020-09-17 | Disposition: A | Discharge status: Home / Self Care | Payer: 59 | Source: Ambulatory Visit | Attending: Obstetrics & Gynecology | Admitting: Obstetrics & Gynecology

## 2020-09-17 ENCOUNTER — Ambulatory Visit (INDEPENDENT_AMBULATORY_CARE_PROVIDER_SITE_OTHER): Payer: 59 | Admitting: Obstetrics & Gynecology

## 2020-09-17 VITALS — BP 131/83 | HR 76 | Wt 168.4 lb

## 2020-09-17 DIAGNOSIS — R87612 Low grade squamous intraepithelial lesion on cytologic smear of cervix (LGSIL): Secondary | ICD-10-CM | POA: Diagnosis present

## 2020-09-17 DIAGNOSIS — Z01812 Encounter for preprocedural laboratory examination: Secondary | ICD-10-CM

## 2020-09-17 DIAGNOSIS — Z1231 Encounter for screening mammogram for malignant neoplasm of breast: Secondary | ICD-10-CM

## 2020-09-17 LAB — POCT URINE PREGNANCY: Preg Test, Ur: NEGATIVE

## 2020-09-17 NOTE — Patient Instructions (Signed)
COLPOSCOPY POST-PROCEDURE INSTRUCTIONS  1. You may take Ibuprofen, Aleve or Tylenol for cramping if needed.  2. If Monsel's solution was used, you will have a black discharge.  3. Light bleeding is normal.  If bleeding is heavier than your period, please call.  4. Put nothing in your vagina until the bleeding or discharge stops (usually 2 or 3 days).  5. We will call you within one week with biopsy results or discuss the results at your follow-up appointment if needed.       Preventive Care 48-48 Years Old, Female Preventive care refers to lifestyle choices and visits with your health care provider that can promote health and wellness. This includes:  A yearly physical exam. This is also called an annual wellness visit.  Regular dental and eye exams.  Immunizations.  Screening for certain conditions.  Healthy lifestyle choices, such as: ? Eating a healthy diet. ? Getting regular exercise. ? Not using drugs or products that contain nicotine and tobacco. ? Limiting alcohol use. What can I expect for my preventive care visit? Physical exam Your health care provider will check your:  Height and weight. These may be used to calculate your BMI (body mass index). BMI is a measurement that tells if you are at a healthy weight.  Heart rate and blood pressure.  Body temperature.  Skin for abnormal spots. Counseling Your health care provider may ask you questions about your:  Past medical problems.  Family's medical history.  Alcohol, tobacco, and drug use.  Emotional well-being.  Home life and relationship well-being.  Sexual activity.  Diet, exercise, and sleep habits.  Work and work Statistician.  Access to firearms.  Method of birth control.  Menstrual cycle.  Pregnancy history. What immunizations do I need? Vaccines are usually given at various ages, according to a schedule. Your health care provider will recommend vaccines for you based on your age,  medical history, and lifestyle or other factors, such as travel or where you work.   What tests do I need? Blood tests  Lipid and cholesterol levels. These may be checked every 5 years, or more often if you are over 90 years old.  Hepatitis C test.  Hepatitis B test. Screening  Lung cancer screening. You may have this screening every year starting at age 33 if you have a 30-pack-year history of smoking and currently smoke or have quit within the past 15 years.  Colorectal cancer screening. ? All adults should have this screening starting at age 31 and continuing until age 25. ? Your health care provider may recommend screening at age 55 if you are at increased risk. ? You will have tests every 1-10 years, depending on your results and the type of screening test.  Diabetes screening. ? This is done by checking your blood sugar (glucose) after you have not eaten for a while (fasting). ? You may have this done every 1-3 years.  Mammogram. ? This may be done every 1-2 years. ? Talk with your health care provider about when you should start having regular mammograms. This may depend on whether you have a family history of breast cancer.  BRCA-related cancer screening. This may be done if you have a family history of breast, ovarian, tubal, or peritoneal cancers.  Pelvic exam and Pap test. ? This may be done every 3 years starting at age 12. ? Starting at age 29, this may be done every 5 years if you have a Pap test in combination with an  HPV test. Other tests  STD (sexually transmitted disease) testing, if you are at risk.  Bone density scan. This is done to screen for osteoporosis. You may have this scan if you are at high risk for osteoporosis. Talk with your health care provider about your test results, treatment options, and if necessary, the need for more tests. Follow these instructions at home: Eating and drinking  Eat a diet that includes fresh fruits and vegetables, whole  grains, lean protein, and low-fat dairy products.  Take vitamin and mineral supplements as recommended by your health care provider.  Do not drink alcohol if: ? Your health care provider tells you not to drink. ? You are pregnant, may be pregnant, or are planning to become pregnant.  If you drink alcohol: ? Limit how much you have to 0-1 drink a day. ? Be aware of how much alcohol is in your drink. In the [place] one drink equals one 12 oz bottle of beer (355 mL), one 5 oz glass of wine (148 mL), or one 1 oz glass of hard liquor (44 mL).   Lifestyle  Take daily care of your teeth and gums. Brush your teeth every morning and night with fluoride toothpaste. Floss one time each day.  Stay active. Exercise for at least 30 minutes 5 or more days each week.  Do not use any products that contain nicotine or tobacco, such as cigarettes, e-cigarettes, and chewing tobacco. If you need help quitting, ask your health care provider.  Do not use drugs.  If you are sexually active, practice safe sex. Use a condom or other form of protection to prevent STIs (sexually transmitted infections).  If you do not wish to become pregnant, use a form of birth control. If you plan to become pregnant, see your health care provider for a prepregnancy visit.  If told by your health care provider, take low-dose aspirin daily starting at age 22.  Find healthy ways to cope with stress, such as: ? Meditation, yoga, or listening to music. ? Journaling. ? Talking to a trusted person. ? Spending time with friends and family. Safety  Always wear your seat belt while driving or riding in a vehicle.  Do not drive: ? If you have been drinking alcohol. Do not ride with someone who has been drinking. ? When you are tired or distracted. ? While texting.  Wear a helmet and other protective equipment during sports activities.  If you have firearms in your house, make sure you follow all gun safety procedures. What's  next?  Visit your health care provider once a year for an annual wellness visit.  Ask your health care provider how often you should have your eyes and teeth checked.  Stay up to date on all vaccines. This information is not intended to replace advice given to you by your health care provider. Make sure you discuss any questions you have with your health care provider. Document Revised: 02/25/2020 Document Reviewed: 02/01/2018 Elsevier Patient Education  2021 [employer].

## 2020-09-17 NOTE — Progress Notes (Signed)
    GYNECOLOGY OFFICE COLPOSCOPY PROCEDURE NOTE  50 y.o. [id number] here for repeat pap smear colposcopy for low-grade squamous intraepithelial neoplasia (LGSIL - encompassing HPV,mild dysplasia,CIN I) pap smear on 04/12/2018. Has not followed up earlier due to pandemic and other issues. Reports skipping menstrual periods, feels she is around the time of menopause. No other gynecologic concerns reported.  Discussed role for HPV in cervical dysplasia, need for surveillance.  Patient gave informed written consent, time out was performed.  Placed in lithotomy position. Cervix viewed with speculum and pap smear done. Cervix viewed with colposcope after application of acetic acid.   Colposcopy adequate? Yes No visible lesions noted.  ECC specimen obtained, labeled and sent to pathology.  Patient was given post procedure instructions.  Will follow up pathology and manage accordingly; patient will be contacted with results and recommendations.  Routine preventative health maintenance measures emphasized, screening  mammogram ordered for patient. She will follow up with PCP and other providers for other medical concerns.     Jaynie Collins, MD, FACOG Obstetrician & Gynecologist, [facility] for [employer], [facility]

## 2020-09-17 NOTE — Addendum Note (Signed)
Addended by: Jaynie Collins A on: 09/17/2020 11:16 AM   Modules accepted: Orders

## 2020-09-22 LAB — CYTOLOGY - PAP
Comment: NEGATIVE
Diagnosis: UNDETERMINED — AB
High risk HPV: NEGATIVE

## 2020-09-23 ENCOUNTER — Other Ambulatory Visit: Payer: Self-pay

## 2020-09-23 ENCOUNTER — Ambulatory Visit (INDEPENDENT_AMBULATORY_CARE_PROVIDER_SITE_OTHER): Payer: 59

## 2020-09-23 DIAGNOSIS — F341 Dysthymic disorder: Secondary | ICD-10-CM | POA: Diagnosis not present

## 2020-09-23 DIAGNOSIS — F319 Bipolar disorder, unspecified: Secondary | ICD-10-CM

## 2020-09-23 NOTE — Progress Notes (Signed)
Patient arrived for her monthly Abilify Maintena 400 mg injection, she received her injection in her upper lef gluteal area intramuscular. Tolerated well. No complaints. Reminded her to set up her next injection in 28 days, around 10/21/2020 Patient's injection is supplied and stored at the office.    LOT [id number] EXP JUL 2023

## 2020-09-28 ENCOUNTER — Telehealth: Payer: Self-pay | Admitting: *Deleted

## 2020-09-28 NOTE — Telephone Encounter (Signed)
-----   Message from Tereso Newcomer, MD sent at 09/25/2020 12:10 PM EDT ----- 09/17/2020 Colposcopy Pathology Endocervix, curettage - LOW GRADE SQUAMOUS INTRAEPITHELIAL LESION, CIN-I (MILD DYSPLASIA).  Needs repeat pap and HPV test in one year. Please call to inform patient of results and recommendations.

## 2020-09-28 NOTE — Telephone Encounter (Signed)
Pt informed of results and repeat pap in one year.

## 2020-10-19 ENCOUNTER — Other Ambulatory Visit: Payer: Self-pay

## 2020-10-19 ENCOUNTER — Ambulatory Visit (INDEPENDENT_AMBULATORY_CARE_PROVIDER_SITE_OTHER): Payer: 59

## 2020-10-19 DIAGNOSIS — F3162 Bipolar disorder, current episode mixed, moderate: Secondary | ICD-10-CM

## 2020-10-19 NOTE — Progress Notes (Signed)
Nurse Visit: pt here for her monthly Abilify Maintena 400 mg injection. She received in her left upper gluteal area intramuscular. Tolerated well. Pt reports feeling more down and depressed, she has gained weight and she is upset about that. Discussed that her stress and moods can effect her weight as well. Encouraged her to get out in the sun at least 15 minutes daily and to also take a walk daily to help relax. She has an upcoming court date this Thursday, she did not elaborate on what it was about. She reports that it's so hard having bipolar, she reports she would not get herself in these bad situations if she didn't experience the mood instability. She makes decisions erratically when she's manic then has to pay for the consequences later. She has been receiving her injection monthly and has been consistent.   Instructed her on what date to set up next apt the week of June 13 th is 28 days.  Pt's injection is supplied by the office and stored at office as well.   LOT [id number] EXP JUL 2023

## 2020-10-23 ENCOUNTER — Other Ambulatory Visit: Payer: Self-pay

## 2020-10-23 ENCOUNTER — Ambulatory Visit (INDEPENDENT_AMBULATORY_CARE_PROVIDER_SITE_OTHER): Payer: 59 | Admitting: Family Medicine

## 2020-10-23 VITALS — BP 100/80 | HR 80 | Ht 64.0 in | Wt 167.4 lb

## 2020-10-23 DIAGNOSIS — R1012 Left upper quadrant pain: Secondary | ICD-10-CM | POA: Diagnosis not present

## 2020-10-23 MED ORDER — OMEPRAZOLE 40 MG PO CPDR
40.0000 mg | DELAYED_RELEASE_CAPSULE | Freq: Every day | ORAL | 1 refills | Status: DC
Start: 2020-10-23 — End: 2021-02-05

## 2020-10-23 MED ORDER — SUCRALFATE 1 GM/10ML PO SUSP: 1.0000 g | Freq: Three times a day (TID) | ORAL | 0 refills | Status: DC

## 2020-10-23 MED ORDER — ACETAMINOPHEN ER 650 MG PO TBCR: 650.0000 mg | EXTENDED_RELEASE_TABLET | Freq: Three times a day (TID) | ORAL | 0 refills | Status: DC | PRN

## 2020-10-23 NOTE — Patient Instructions (Addendum)
I am obtaining labs to further evaluate your abdominal pain. In the mean time, please start taking Omeprazole 40mg  daily plus Sucralfate solution with meals and before bed. You can take tylenol 650mg  every 6 hours as needed for. I would recommend you take this every 6 hours for the next 48 hours to stay ahead of the pain. I will be in contact with you regarding labs.
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
## 2020-10-24 LAB — CBC WITH DIFFERENTIAL
Basophils Absolute: 0.1 10*3/uL (ref 0.0–0.2)
Basos: 1 %
EOS (ABSOLUTE): 0.2 10*3/uL (ref 0.0–0.4)
Eos: 3 %
Hematocrit: 39.4 % (ref 34.0–46.6)
Hemoglobin: 12.7 g/dL (ref 11.1–15.9)
Immature Grans (Abs): 0 10*3/uL (ref 0.0–0.1)
Immature Granulocytes: 0 %
Lymphocytes Absolute: 2.5 10*3/uL (ref 0.7–3.1)
Lymphs: 28 %
MCH: 28.5 pg (ref 26.6–33.0)
MCHC: 32.2 g/dL (ref 31.5–35.7)
MCV: 88 fL (ref 79–97)
Monocytes Absolute: 0.6 10*3/uL (ref 0.1–0.9)
Monocytes: 7 %
Neutrophils Absolute: 5.6 10*3/uL (ref 1.4–7.0)
Neutrophils: 61 %
RBC: 4.46 x10E6/uL (ref 3.77–5.28)
RDW: 14.2 % (ref 11.7–15.4)
WBC: 9 10*3/uL (ref 3.4–10.8)

## 2020-10-24 LAB — COMPREHENSIVE METABOLIC PANEL
ALT: 20 IU/L (ref 0–32)
AST: 17 IU/L (ref 0–40)
Albumin/Globulin Ratio: 1.8 (ref 1.2–2.2)
Albumin: 4.3 g/dL (ref 3.8–4.8)
Alkaline Phosphatase: 86 IU/L (ref 44–121)
BUN/Creatinine Ratio: 15 (ref 9–23)
BUN: 10 mg/dL (ref 6–24)
Bilirubin Total: 0.3 mg/dL (ref 0.0–1.2)
CO2: 21 mmol/L (ref 20–29)
Calcium: 9.1 mg/dL (ref 8.7–10.2)
Chloride: 101 mmol/L (ref 96–106)
Creatinine, Ser: 0.68 mg/dL (ref 0.57–1.00)
Globulin, Total: 2.4 g/dL (ref 1.5–4.5)
Glucose: 90 mg/dL (ref 65–99)
Potassium: 4.5 mmol/L (ref 3.5–5.2)
Sodium: 137 mmol/L (ref 134–144)
Total Protein: 6.7 g/dL (ref 6.0–8.5)
eGFR: 108 mL/min/{1.73_m2} (ref >59)

## 2020-10-24 LAB — SEDIMENTATION RATE: Sed Rate: 21 mm/hr (ref 0–32)

## 2020-10-24 LAB — LIPASE: Lipase: 38 U/L (ref 14–72)

## 2020-10-24 LAB — C-REACTIVE PROTEIN: CRP: 1 mg/L (ref 0–10)
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
## 2020-10-28 ENCOUNTER — Other Ambulatory Visit: Payer: Self-pay | Admitting: Obstetrics and Gynecology

## 2020-10-28 DIAGNOSIS — Z1231 Encounter for screening mammogram for malignant neoplasm of breast: Secondary | ICD-10-CM

## 2020-10-30 ENCOUNTER — Ambulatory Visit (HOSPITAL_COMMUNITY): Admission: RE | Admit: 2020-10-30 | Payer: 59 | Source: Ambulatory Visit

## 2020-11-12 ENCOUNTER — Other Ambulatory Visit: Payer: 59

## 2020-11-20 ENCOUNTER — Other Ambulatory Visit: Payer: Self-pay

## 2020-11-20 ENCOUNTER — Ambulatory Visit (INDEPENDENT_AMBULATORY_CARE_PROVIDER_SITE_OTHER): Payer: 59

## 2020-11-20 DIAGNOSIS — F3162 Bipolar disorder, current episode mixed, moderate: Secondary | ICD-10-CM

## 2020-11-20 NOTE — Progress Notes (Signed)
Nurse Visit: pt here for her monthly Abilify Maintena 400 mg injection. Administered in her left upper gluteal area intramuscular. Tolerated well.    Instructed to set up next injection in 28 days, around July 15 th.  Pt's injection is supplied by the office and stored at office as well.   LOT [id number] EXP [place] 2024

## 2020-12-16 ENCOUNTER — Ambulatory Visit: Payer: 59 | Admitting: Physician Assistant

## 2020-12-21 ENCOUNTER — Other Ambulatory Visit: Payer: Self-pay

## 2020-12-21 ENCOUNTER — Ambulatory Visit (INDEPENDENT_AMBULATORY_CARE_PROVIDER_SITE_OTHER): Payer: 59

## 2020-12-21 DIAGNOSIS — F3162 Bipolar disorder, current episode mixed, moderate: Secondary | ICD-10-CM

## 2020-12-21 NOTE — Progress Notes (Signed)
Nurse Visit: pt here for her monthly Abilify Maintena 400 mg injection. Administered in her left upper gluteal area intramuscular. Tolerated well.    Instructed to set up next injection in 28 days, around the week of August 15 th.   Pt's injection is supplied by the office and stored at office as well.   LOT [id number] EXP FEB 2024

## 2020-12-25 ENCOUNTER — Other Ambulatory Visit: Payer: Self-pay

## 2020-12-25 ENCOUNTER — Ambulatory Visit: Payer: 59

## 2020-12-25 ENCOUNTER — Encounter: Payer: Self-pay | Admitting: Physician Assistant

## 2020-12-25 ENCOUNTER — Ambulatory Visit (INDEPENDENT_AMBULATORY_CARE_PROVIDER_SITE_OTHER): Payer: 59 | Admitting: Physician Assistant

## 2020-12-25 DIAGNOSIS — F411 Generalized anxiety disorder: Secondary | ICD-10-CM | POA: Diagnosis not present

## 2020-12-25 DIAGNOSIS — F3162 Bipolar disorder, current episode mixed, moderate: Secondary | ICD-10-CM

## 2020-12-25 DIAGNOSIS — F4310 Post-traumatic stress disorder, unspecified: Secondary | ICD-10-CM
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
## 2020-12-27 ENCOUNTER — Encounter: Payer: Self-pay | Admitting: Physician Assistant

## 2021-01-21 ENCOUNTER — Ambulatory Visit: Payer: 59

## 2021-01-22 ENCOUNTER — Ambulatory Visit (INDEPENDENT_AMBULATORY_CARE_PROVIDER_SITE_OTHER): Payer: 59

## 2021-01-22 ENCOUNTER — Telehealth: Payer: Self-pay

## 2021-01-22 ENCOUNTER — Other Ambulatory Visit: Payer: Self-pay | Admitting: Physician Assistant

## 2021-01-22 ENCOUNTER — Other Ambulatory Visit: Payer: Self-pay

## 2021-01-22 DIAGNOSIS — F3162 Bipolar disorder, current episode mixed, moderate: Secondary | ICD-10-CM

## 2021-01-22 MED ORDER — OXCARBAZEPINE 300 MG PO TABS: 300.0000 mg | ORAL_TABLET | Freq: Two times a day (BID) | ORAL | 1 refills | Status: DC

## 2021-01-22 MED ORDER — HYDROXYZINE HCL 25 MG PO TABS: 25.0000 mg | ORAL_TABLET | Freq: Three times a day (TID) | ORAL | 1 refills | Status: DC | PRN

## 2021-01-22 NOTE — Telephone Encounter (Signed)
I sent in both the hydroxyzine and Trileptal.

## 2021-01-22 NOTE — Progress Notes (Signed)
Nurse Visit: pt her for monthly Abilify Maintena 400 mg injection. Administered in her left upper gluteal area intramuscular. She tolerated well. No complaints voiced.   Pt will come back in 28 days for next injection.  Pt's injection is supplied by the office. [id number]   Pt voiced a lot of issues going on with her and causing stress. Her daughter is pregnant so they are trying to get ready for the baby. Her Mom is forgetting things and hard to watch that. She has court dates as well.   Pt reports she is not sleeping at night. She reports she needs a refill for her Hydroxyzine as well to help slow her down during the day. She was very talkative and having racing thoughts from one thing to the next. She wants to catch things before it gets out of hand. She mentioned taking Trileptal before and it helped slow her down. She said during the day when she takes it she's too sleepy but nighttime would work.   Thinking she needs a sooner apt or get started on something to help.   Pt uses [facility] [address]
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
## 2021-01-25 ENCOUNTER — Ambulatory Visit: Payer: 59 | Admitting: Family Medicine

## 2021-01-27 ENCOUNTER — Telehealth: Payer: Self-pay | Admitting: *Deleted

## 2021-01-27 ENCOUNTER — Other Ambulatory Visit: Payer: Self-pay | Admitting: Family Medicine

## 2021-01-27 DIAGNOSIS — F3163 Bipolar disorder, current episode mixed, severe, without psychotic features: Secondary | ICD-10-CM

## 2021-01-27 NOTE — Telephone Encounter (Signed)
Patient is requesting a new referral be placed for [facility].  She lives closer to that location and they are able to accept her new insurance.  Will forward to MD. Referral pending in this chart.   Codylee Patil,CMA

## 2021-01-29 ENCOUNTER — Ambulatory Visit: Payer: 59 | Admitting: Physician Assistant

## 2021-02-04 ENCOUNTER — Encounter (HOSPITAL_COMMUNITY): Payer: Self-pay

## 2021-02-04 ENCOUNTER — Emergency Department (HOSPITAL_COMMUNITY)
Admission: EM | Admit: 2021-02-04 | Discharge: 2021-02-05 | Disposition: A | Discharge status: Other Acute Inpatient Hospital | Payer: 59 | Attending: Emergency Medicine | Admitting: Emergency Medicine

## 2021-02-04 ENCOUNTER — Other Ambulatory Visit: Payer: Self-pay

## 2021-02-04 DIAGNOSIS — Z79899 Other long term (current) drug therapy: Secondary | ICD-10-CM | POA: Insufficient documentation

## 2021-02-04 DIAGNOSIS — F32A Depression, unspecified: Secondary | ICD-10-CM | POA: Diagnosis present

## 2021-02-04 DIAGNOSIS — F17210 Nicotine dependence, cigarettes, uncomplicated: Secondary | ICD-10-CM | POA: Insufficient documentation

## 2021-02-04 DIAGNOSIS — F4310 Post-traumatic stress disorder, unspecified: Secondary | ICD-10-CM | POA: Diagnosis not present

## 2021-02-04 DIAGNOSIS — F312 Bipolar disorder, current episode manic severe with psychotic features: Secondary | ICD-10-CM | POA: Diagnosis present

## 2021-02-04 DIAGNOSIS — J45909 Unspecified asthma, uncomplicated: Secondary | ICD-10-CM | POA: Insufficient documentation

## 2021-02-04 DIAGNOSIS — F319 Bipolar disorder, unspecified: Secondary | ICD-10-CM | POA: Diagnosis not present

## 2021-02-04 DIAGNOSIS — Z20822 Contact with and (suspected) exposure to covid-19: Secondary | ICD-10-CM | POA: Diagnosis not present

## 2021-02-04 DIAGNOSIS — N9489 Other specified conditions associated with female genital organs and menstrual cycle: Secondary | ICD-10-CM | POA: Insufficient documentation

## 2021-02-04 DIAGNOSIS — F332 Major depressive disorder, recurrent severe without psychotic features: Secondary | ICD-10-CM | POA: Diagnosis present

## 2021-02-04 DIAGNOSIS — F3163 Bipolar disorder, current episode mixed, severe, without psychotic features: Secondary | ICD-10-CM | POA: Insufficient documentation

## 2021-02-04 HISTORY — DX: Post-traumatic stress disorder, unspecified: F43.10

## 2021-02-04 LAB — CBC WITH DIFFERENTIAL/PLATELET
Abs Immature Granulocytes: 0.09 10*3/uL — ABNORMAL HIGH (ref 0.00–0.07)
Basophils Absolute: 0 10*3/uL (ref 0.0–0.1)
Basophils Relative: 0 %
Eosinophils Absolute: 0 10*3/uL (ref 0.0–0.5)
Eosinophils Relative: 0 %
HCT: 37.3 % (ref 36.0–46.0)
Hemoglobin: 12.3 g/dL (ref 12.0–15.0)
Immature Granulocytes: 1 %
Lymphocytes Relative: 13 %
Lymphs Abs: 1.9 10*3/uL (ref 0.7–4.0)
MCH: 28.9 pg (ref 26.0–34.0)
MCHC: 33 g/dL (ref 30.0–36.0)
MCV: 87.6 fL (ref 80.0–100.0)
Monocytes Absolute: 1.2 10*3/uL — ABNORMAL HIGH (ref 0.1–1.0)
Monocytes Relative: 8 %
Neutro Abs: 11.6 10*3/uL — ABNORMAL HIGH (ref 1.7–7.7)
Neutrophils Relative %: 78 %
Platelets: 288 10*3/uL (ref 150–400)
RBC: 4.26 MIL/uL (ref 3.87–5.11)
RDW: 13.6 % (ref 11.5–15.5)
WBC: 14.9 10*3/uL — ABNORMAL HIGH (ref 4.0–10.5)
nRBC: 0 % (ref 0.0–0.2)

## 2021-02-04 LAB — COMPREHENSIVE METABOLIC PANEL
ALT: 97 U/L — ABNORMAL HIGH (ref 0–44)
AST: 149 U/L — ABNORMAL HIGH (ref 15–41)
Albumin: 4.1 g/dL (ref 3.5–5.0)
Alkaline Phosphatase: 72 U/L (ref 38–126)
Anion gap: 9 (ref 5–15)
BUN: 14 mg/dL (ref 6–20)
CO2: 25 mmol/L (ref 22–32)
Calcium: 9 mg/dL (ref 8.9–10.3)
Chloride: 106 mmol/L (ref 98–111)
Creatinine, Ser: 0.65 mg/dL (ref 0.44–1.00)
GFR, Estimated: >60 mL/min (ref >60)
Glucose, Bld: 105 mg/dL — ABNORMAL HIGH (ref 70–99)
Potassium: 3.9 mmol/L (ref 3.5–5.1)
Sodium: 140 mmol/L (ref 135–145)
Total Bilirubin: 1.3 mg/dL — ABNORMAL HIGH (ref 0.3–1.2)
Total Protein: 7 g/dL (ref 6.5–8.1)

## 2021-02-04 LAB — I-STAT BETA HCG BLOOD, ED (MC, WL, AP ONLY): I-stat hCG, quantitative: <5 m[IU]/mL (ref <5)

## 2021-02-04 LAB — RESP PANEL BY RT-PCR (FLU A&B, COVID) ARPGX2
Influenza A by PCR: NEGATIVE
Influenza B by PCR: NEGATIVE
SARS Coronavirus 2 by RT PCR: NEGATIVE

## 2021-02-04 LAB — ETHANOL: Alcohol, Ethyl (B): <10 mg/dL (ref <10)

## 2021-02-04 NOTE — ED Notes (Signed)
Patient changed into burgundy scrubs. Patient has 1 belongings bag in the cabinet in triage.

## 2021-02-04 NOTE — ED Notes (Signed)
Patients 1 bag of belongings placed at the nurses station across from room 18. Patient and her belongings wanded by security.

## 2021-02-04 NOTE — Progress Notes (Deleted)
    SUBJECTIVE:   CHIEF COMPLAINT / HPI:   Hyperlipidemia F/U Patient presents today for cholesterol follow-up.  Last lipid panel was obtained February 2022 and showed elevated cholesterol 256, LDL 187.  Currently, she is not on any statins.  Her diet includes***.  For exercise she***.  PERTINENT  PMH / PSH:  Past Medical History:  Diagnosis Date   Abnormal Pap smear    Unknown results>colpo>normal   Anxiety    Arthritis    Asthma    Bipolar 1 disorder (HCC)    Depression    PTSD (post-traumatic stress disorder)     OBJECTIVE:   There were no vitals taken for this visit. ***  General: NAD, pleasant, able to participate in exam Cardiac: RRR, no murmurs. Respiratory: CTAB, normal effort, No wheezes, rales or rhonchi Abdomen: Bowel sounds present, nontender, nondistended, no hepatosplenomegaly. Extremities: no edema or cyanosis. Skin: warm and dry, no rashes noted Neuro: alert, no obvious focal deficits Psych: Normal affect and mood  ASSESSMENT/PLAN:   No problem-specific Assessment & Plan notes found for this encounter.     Sabino Dick, DO [facility]
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
## 2021-02-04 NOTE — ED Notes (Signed)
TTS at bedside. 
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
## 2021-02-04 NOTE — BH Assessment (Signed)
Clinician attempted to assess the pt however the pt responses were very low and she continued sleeping. Clinician observed the tech redirecting the pt to engage but she continued sleeping. Clinician expressed to the pt and tech she will complete the assessment when the pt is more alert. Clinician messaged Cathie Olden, RN to let her know when the pt able to be seen as she was unable to complete the pt's assessment.     Redmond Pulling, MS, [facility], [facility] Triage Specialist [phone]

## 2021-02-05 ENCOUNTER — Other Ambulatory Visit: Payer: Self-pay

## 2021-02-05 ENCOUNTER — Telehealth: Payer: Self-pay | Admitting: Physician Assistant

## 2021-02-05 ENCOUNTER — Other Ambulatory Visit (HOSPITAL_COMMUNITY)
Admission: EM | Admit: 2021-02-05 | Discharge: 2021-02-10 | Disposition: A | Discharge status: Home / Self Care | Payer: 59 | Attending: Psychiatry | Admitting: Psychiatry

## 2021-02-05 ENCOUNTER — Ambulatory Visit: Payer: 59 | Admitting: Family Medicine

## 2021-02-05 DIAGNOSIS — F319 Bipolar disorder, unspecified: Secondary | ICD-10-CM | POA: Diagnosis not present

## 2021-02-05 DIAGNOSIS — F3160 Bipolar disorder, current episode mixed, unspecified: Secondary | ICD-10-CM | POA: Diagnosis present

## 2021-02-05 DIAGNOSIS — F312 Bipolar disorder, current episode manic severe with psychotic features: Secondary | ICD-10-CM | POA: Diagnosis present

## 2021-02-05 LAB — LIPID PANEL
Cholesterol: 213 mg/dL — ABNORMAL HIGH (ref 0–200)
HDL: 63 mg/dL (ref >40)
LDL Cholesterol: 138 mg/dL — ABNORMAL HIGH (ref 0–99)
Total CHOL/HDL Ratio: 3.4 RATIO
Triglycerides: 58 mg/dL (ref <150)
VLDL: 12 mg/dL (ref 0–40)

## 2021-02-05 LAB — RAPID URINE DRUG SCREEN, HOSP PERFORMED
Amphetamines: NOT DETECTED
Barbiturates: NOT DETECTED
Benzodiazepines: NOT DETECTED
Cocaine: NOT DETECTED
Opiates: NOT DETECTED
Tetrahydrocannabinol: POSITIVE — AB

## 2021-02-05 LAB — HEMOGLOBIN A1C
Hgb A1c MFr Bld: 5.5 % (ref 4.8–5.6)
Mean Plasma Glucose: 111.15 mg/dL

## 2021-02-05 MED ORDER — LORAZEPAM 1 MG PO TABS
2.0000 mg | ORAL_TABLET | Freq: Once | ORAL | Status: AC
  Administered 2021-02-05: 2 mg via ORAL
  Filled 2021-02-05: qty 2

## 2021-02-05 MED ORDER — IBUPROFEN 200 MG PO TABS
400.0000 mg | ORAL_TABLET | Freq: Once | ORAL | Status: AC
  Administered 2021-02-05: 400 mg via ORAL
  Filled 2021-02-05: qty 2

## 2021-02-05 MED ORDER — MAGNESIUM HYDROXIDE 400 MG/5ML PO SUSP
30.0000 mL | Freq: Every day | ORAL | Status: DC | PRN
Start: 2021-02-05 — End: 2021-02-10
  Administered 2021-02-09: 30 mL via ORAL
  Filled 2021-02-05: qty 30

## 2021-02-05 MED ORDER — HYDROXYZINE HCL 25 MG PO TABS
25.0000 mg | ORAL_TABLET | Freq: Three times a day (TID) | ORAL | Status: DC | PRN
  Administered 2021-02-06 – 2021-02-10 (×11): 25 mg via ORAL
  Filled 2021-02-05: qty 10
  Filled 2021-02-05 (×12): qty 1

## 2021-02-05 MED ORDER — ALUM & MAG HYDROXIDE-SIMETH 200-200-20 MG/5ML PO SUSP
30.0000 mL | ORAL | Status: DC | PRN
Start: 2021-02-05 — End: 2021-02-10

## 2021-02-05 MED ORDER — OXCARBAZEPINE 300 MG PO TABS
300.0000 mg | ORAL_TABLET | Freq: Two times a day (BID) | ORAL | Status: DC
  Administered 2021-02-05 – 2021-02-10 (×10): 300 mg via ORAL
  Filled 2021-02-05 (×5): qty 1
  Filled 2021-02-05: qty 14
  Filled 2021-02-05 (×5): qty 1

## 2021-02-05 MED ORDER — TRAZODONE HCL 50 MG PO TABS
50.0000 mg | ORAL_TABLET | Freq: Every evening | ORAL | Status: DC | PRN
Start: 2021-02-05 — End: 2021-02-10
  Administered 2021-02-06 – 2021-02-09 (×4): 50 mg via ORAL
  Filled 2021-02-05 (×4): qty 1
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
## 2021-02-05 NOTE — ED Notes (Addendum)
RN at [facility] given report

## 2021-02-05 NOTE — ED Notes (Signed)
From the TTS consult:  Disposition: Melbourne Abts, PA-C recommends pt to be observed and reassessed by psychiatry.

## 2021-02-05 NOTE — ED Notes (Signed)
Patient nonstop talking, progressively getting louder about her life events that is making her upset. She said "no one fucking cares about me, I just want to die, I got arrested, I lost my job, I just dont have a reason to be here anymore."

## 2021-02-05 NOTE — ED Notes (Signed)
Patient not able to sleep, patient given another requested drink.
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
## 2021-02-05 NOTE — Progress Notes (Signed)
Call placed to [facility] lab for add-on. Tech Kyung Rudd stated that she will check if there is enough blood for the add-on and will update nursing staff. Night nurse updated

## 2021-02-05 NOTE — ED Notes (Addendum)
Pt. Walked up to nurses station asking for bandaids. Pt. Didn't want bandaid to put on her arm. Pt. was requesting bandaids to put on her feet and this tech told her that I couldn't give bandaids to put on her feet. Pt. Said to tech, "Fuck You" you can have the bandaid and the one in your hand, I dont need them."

## 2021-02-05 NOTE — ED Notes (Signed)
Patient tearful, crying about how no one loves her or cares about her. RN messaged counselor that patient is ready for her TTS consult.

## 2021-02-05 NOTE — BH Assessment (Addendum)
[facility] Assessment Progress Note   Per Maxie Barb, NP, pt is to be transferred to the [facility].  Pt has been accepted by Dr Bronwen Betters.  Pt has signed consents, which have been faxed to [phone].  Please call report to [phone].  Pt is to be transported via [employer].  EDP Alvira Monday, MD and pt's nurse, Rosette Reveal, have been notified.  Doylene Canning, [place] [facility] Coordinator [phone]
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
## 2021-02-05 NOTE — Telephone Encounter (Signed)
FYI

## 2021-02-05 NOTE — ED Notes (Signed)
Pt oriented to unit and given food and fluids. 

## 2021-02-05 NOTE — ED Notes (Signed)
TTS at bedside. 2nd attempt.
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
## 2021-02-05 NOTE — Telephone Encounter (Signed)
[name] from [facility] [phone]) called to say that Kelly Carr was being admitted voluntarily to the [facility] Unit. She has signed a consent to release info to [name] if she wants to call.  He thinks the nurse working with her is at [phone]

## 2021-02-05 NOTE — ED Notes (Signed)
Patient given new clean scrubs and toothbrush and tooth paste.
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
## 2021-02-06 ENCOUNTER — Encounter (HOSPITAL_COMMUNITY): Payer: Self-pay | Admitting: Behavioral Health

## 2021-02-06 LAB — TSH: TSH: 2.926 u[IU]/mL (ref 0.350–4.500)

## 2021-02-06 MED ORDER — NICOTINE 21 MG/24HR TD PT24
MEDICATED_PATCH | TRANSDERMAL | Status: AC
  Filled 2021-02-06: qty 1

## 2021-02-06 MED ORDER — NICOTINE 21 MG/24HR TD PT24
21.0000 mg | MEDICATED_PATCH | Freq: Every day | TRANSDERMAL | Status: DC
  Administered 2021-02-06 – 2021-02-10 (×5): 21 mg via TRANSDERMAL
  Filled 2021-02-06 (×4): qty 1

## 2021-02-06 MED ORDER — IBUPROFEN 600 MG PO TABS
600.0000 mg | ORAL_TABLET | Freq: Four times a day (QID) | ORAL | Status: DC | PRN
  Administered 2021-02-06 – 2021-02-10 (×10): 600 mg via ORAL
  Filled 2021-02-06 (×10): qty 1
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
## 2021-02-06 NOTE — Telephone Encounter (Signed)
noted 

## 2021-02-06 NOTE — ED Notes (Signed)
Pt given dinner  

## 2021-02-06 NOTE — ED Notes (Signed)
Pt c/o shoulder pain at 8/10 and requested ibuprofen. 600 mg ibuprofen PRN given.
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
## 2021-02-06 NOTE — ED Notes (Signed)
Pt given snack. 

## 2021-02-06 NOTE — ED Notes (Signed)
Pt given breakfast.

## 2021-02-06 NOTE — ED Notes (Signed)
Pt requested to have her citation from court out of her locker so that she can be aware of her court date. Pt was given her paperwork and was allowed to keep the paperwork with her in her room. RN notified.

## 2021-02-06 NOTE — ED Notes (Signed)
Went to give patient her requested Ibuprofen and visteril. Patient asleep - will continue to monitor for safety
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
## 2021-02-06 NOTE — ED Notes (Signed)
Pt ambulating in hallway in no acute distress. Preparing to shower. Safety maintained.

## 2021-02-06 NOTE — ED Notes (Signed)
Pt tearful stated, "I just tried calling my mother and she told me to not call her again". Pt informed RN in previous conversation that mother was in advanced stages of Alzheimer's. RN provided support and education on stages of the disease. Pt appreciative of staff support. Will continue to monitor for safety.

## 2021-02-06 NOTE — ED Notes (Signed)
Pt given lunch

## 2021-02-06 NOTE — ED Notes (Signed)
Pt on phone talking with daughter. No acute distress noted. Safety maintained.

## 2021-02-06 NOTE — ED Notes (Signed)
Patient to nurses station wanting her visteril and ibuprofen.  Meds administered and patient went back to bed, will monitor for safety
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
## 2021-02-06 NOTE — ED Notes (Signed)
Pt sitting in dayroom watching tv in no acute distress. Safety maintained. 
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
## 2021-02-07 NOTE — ED Notes (Signed)
Patient woke up early with complaints of stomach ache. She asked for water and pretzels. She ate in the day room and returned to bed.

## 2021-02-07 NOTE — Progress Notes (Signed)
Patient requesting Vistaril after finishing group.  Pleasant, but teary.

## 2021-02-07 NOTE — ED Notes (Signed)
Pt given dinner  

## 2021-02-07 NOTE — ED Notes (Signed)
Patient with small abraded area to top of left hand.  Asking about neosporin or vaseline.  Also asking about area on left heel. Stated MD had already looked at it and instructed her to leave it open to the air.  NP notified of patient concern.  Per NP, continue to keep areas clean and dry, open to the air.  Patient given lotion for dry skin.  Requested and given bandaid to cover while up and walking as patient stated her sock is rubbing on the area on her heel.  Instructed to leave open to the air at night or when in bed. Patient verbalized understanding.
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
## 2021-02-07 NOTE — ED Notes (Signed)
Patient sleeping in her room, no signs/symptoms of distress. Respirations regular and unlabored. Will continue to monitor.

## 2021-02-07 NOTE — ED Notes (Signed)
Patient is resting quietly in bed with eyes closed. She recently woke up to go to the bathroom about 11:15. She has no complaints of pain or discomfort. She received a PRN trazodone at 2104.
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
## 2021-02-07 NOTE — ED Notes (Signed)
Pt sitting in dining room watching TV. A&O x4, calm and cooperative. Denies current SI/HI/AVH. Pt denies any immediate needs. No signs of acute distress noted. Will continue to monitor for safety. 

## 2021-02-07 NOTE — ED Notes (Signed)
Patient in dining room watching television and interacting with a peer.  Had breakfast.
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
## 2021-02-07 NOTE — Progress Notes (Signed)
Patient pleasant.  Denied SI, HI, AVH. Stated attending groups. Complaints of chronic generalized body aches.  Requested Ibuprofen.  Stated Trazodone helping with sleep.  Denied any other complaints.

## 2021-02-07 NOTE — Progress Notes (Signed)
Patient at nurse's station talking with MHT.  Pleasant.

## 2021-02-07 NOTE — ED Notes (Signed)
Pt given lunch
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
## 2021-02-07 NOTE — Progress Notes (Signed)
Patient attending group. 

## 2021-02-08 MED ORDER — LOPERAMIDE HCL 2 MG PO CAPS
2.0000 mg | ORAL_CAPSULE | Freq: Three times a day (TID) | ORAL | Status: AC | PRN
  Administered 2021-02-08: 2 mg via ORAL
  Filled 2021-02-08: qty 1

## 2021-02-08 MED ORDER — PRAZOSIN HCL 1 MG PO CAPS
1.0000 mg | ORAL_CAPSULE | Freq: Every day | ORAL | Status: DC
  Administered 2021-02-08 – 2021-02-09 (×2): 1 mg via ORAL
  Filled 2021-02-08: qty 1
  Filled 2021-02-08: qty 7
  Filled 2021-02-08: qty 1
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
## 2021-02-08 NOTE — ED Notes (Signed)
Given lunch

## 2021-02-08 NOTE — ED Notes (Signed)
Pt up to nurse's station requesting PRN Vistaril and stating, "I'm not going to argue with the devil anymore." Pt now sitting on couch reading a book by nurse's station. No signs of acute distress noted. Will continue to monitor for safety.

## 2021-02-08 NOTE — ED Notes (Signed)
Pt requesting medication for diarrhea. Roselyn Bering, NP notified.

## 2021-02-08 NOTE — Progress Notes (Signed)
Patient sitting in dayroom, playing cards, and socializing with peers.

## 2021-02-08 NOTE — ED Notes (Signed)
Patient obviously anxious and depressed.  Spoke at length about her past relationships, her family, her daughter.  Patient denies SI or HI and AVH. Patient able to list some positive things in her life and states some coping mechanisms.  Worried about BP due to new med.  BP WNL.  Will continue to monitor for safety

## 2021-02-08 NOTE — ED Notes (Signed)
Given  breakfast

## 2021-02-08 NOTE — ED Notes (Signed)
Pt asleep in bed. Respirations even and unlabored. Will continue to monitor for safety. ?
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
## 2021-02-08 NOTE — ED Notes (Signed)
Pt resting in room. Respirations even and unlabored. Will continue to monitor for safety.

## 2021-02-08 NOTE — Progress Notes (Signed)
Patient visible on unit.  Asked for medications early.  Meds given.  Denied SI, HI, AVH.  Discussed coping mechanisms and need to redirect thoughts.  Patient agreed, stating "today is a new day."  Continue to monitor for safety.

## 2021-02-08 NOTE — Progress Notes (Signed)
Patient complaining of ongoing, but intermittent shoulder and neck pain.  Requested and given Ibuprofen.

## 2021-02-08 NOTE — ED Notes (Signed)
Pt up to nurse's station requesting water and crackers, stating her stomach is upset from "nerves." Water and saltine crackers provided. Pt states she "wants to apologize for what I said last night." Emotional support and encouragement provided by nursing staff. No signs of acute distress noted. Will continue to monitor for safety.
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
## 2021-02-08 NOTE — Progress Notes (Signed)
Patient took shower.  In dining room for breakfast.  No complaints this AM.

## 2021-02-09 NOTE — ED Notes (Signed)
Pt sitting in hallway writing on journal. No acute distress noted. Safety maintained.

## 2021-02-09 NOTE — ED Notes (Signed)
Patient resting in bed. No signs of distress at this time. Will continue to monitor for safety

## 2021-02-09 NOTE — ED Notes (Signed)
Pt sitting in dayroom interacting with staff and peers in no acute distress. Reports being accepted at the [facility]. MD made aware. Plans to discharge pt to [facility] tomorrow. Pt verbalized agreement. Safety maintained.

## 2021-02-09 NOTE — ED Notes (Signed)
Patient resting in bed - eyes closed. No signs of distress noted - will continue to monitor for safety

## 2021-02-09 NOTE — ED Notes (Signed)
Pt requested her PRN Ibuprofen stating she had moderate pain.
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
## 2021-02-09 NOTE — ED Notes (Addendum)
D:  Patient A&Ox4. Denies intent to harm self/others when asked. Denies A/VH. Patient denies any physical complaints when asked. No acute distress noted.      A: Support and encouragement provided. Routine safety checks conducted according to facility protocol. Encouraged patient to notify staff if thoughts of harm toward self or others arise. Pt actively participating in groups. Patient verbalize understanding and agreement.   R: Patient remains safe and patient verbally contracts for safety at this time. Will continue to monitor for safety.

## 2021-02-09 NOTE — ED Notes (Signed)
Pt came to this nurse expressing how happy she was about her pending d/c, stating that she would be admitted into the [facility] on tomorrow. The patient feels that this is a step in the positive direction for herself and her pregnant daughter.

## 2021-02-09 NOTE — ED Notes (Signed)
Patient wanted to remove her nicotine patch as she stated that it gives her night mares. Patch has been removed. Patient is now lying in bed quietly, respirations are even/unlabored, environment check complete/secure, will continue to monitor patient for safety

## 2021-02-09 NOTE — Clinical Social Work Psych Note (Signed)
Anxiety  Diagnosis: Anxiety (Psychoeducational)  Date: 02/09/21  Type of Therapy/Therapeutic Modalities: Group Discussion, Psycho-Education  Participation Level: Active  Objective: The purpose of the group is to educate patients on the various components of anxiety and how it can influence and/or contribute to the inappropriate or disproportionate responses to perceived threats, leading to persistent and intrusive symptoms associated with different anxiety disorders.   Therapeutic Goals:  Patient will learn the foundations of anxiety, including its definition and the various types of anxiety disorders.  Patient will discuss with group members their personal experiences with anxiety and how it has impacted their lives  Patient will learn various distress tolerance and relaxation techniques that are effective in treating anxiety symptoms.  Patient will discuss with group members how they plan to address and/or maintain their anxiety symptoms moving forward.   Summary of Patient's Progress:  Kelly Carr was engaged and participated throughout the group session. Kelly Carr reports that she has struggled with anxiety for many years and she believes it is the "biggest" contributor to her depressive episodes. Kelly Carr reports that she has identified multiple physical symptoms that warn her she is experiencing increased anxiety. Kelly Carr reports she experiences heart racing, becoming sweaty and trouble sleeping.

## 2021-02-09 NOTE — ED Provider Notes (Signed)
Behavioral Health Progress Note  Date and Time: 02/09/2021 2:33 PM Name: Kelly Carr MRN:  [id number]  Subjective: Patient seen and chart reviewed. She has been medication compliant. Patient interviewed this afternoon. She is calm, cooperative and pleasant. She is ruminative about her relationship with her ex boyfriend and discusses conflict with him in detail. She states that she slept better last night with the addition of prazosin and denies experiencing nightmares last night. She discusses at length the changes that she would like to make in her life and is future oriented about "a new chapter" in her life as she plans to put herself first instead of putting others before herself. No SI/HI/AVH. She discusses her excitement in preparing for the birth of her first grand child and is looking forward to the baby shower in a couple months. Patient is tearful when discussing discharge planning as she is concerned about her safety on the streets or in a shelter. She states that the AA group was helpful yesterday and expresses interest in rehab. She states that she has not drank in 6 months or used marijuana in ~4 weeks although reports ongoing urges. She expressed interest in possibly going to a rehab. SW notified of patient request although unclear if she will meet criteria for rehab given recent abstinence from substances.       Diagnosis:  Final diagnoses:  Bipolar I disorder with depression ([place])    Total Time spent with patient: 30 minutes  Past Psychiatric History: Bipolar 1 dx, and prior psychiatric hospitalizations Past Medical History:  Past Medical History:  Diagnosis Date   Abnormal Pap smear    Unknown results>colpo>normal   Anxiety    Arthritis    Asthma    Bipolar 1 disorder ([place])    Depression    PTSD (post-traumatic stress disorder)     Past Surgical History:  Procedure Laterality Date   NO PAST SURGERIES     Family History:  Family History  Problem Relation Age of  Onset   Diabetes Mother    Hypertension Mother    Heart disease Mother 7   Schizophrenia Mother    Diabetes Maternal Grandmother    Heart disease Maternal Grandmother    Diabetes Maternal Grandfather    Heart disease Maternal Grandfather    Depression Daughter    Bipolar disorder Cousin    Bipolar disorder Nephew    Family Psychiatric  History: no hx reported  Social History:  Social History   Substance and Sexual Activity  Alcohol Use Not Currently   Comment: 1 per month     Social History   Substance and Sexual Activity  Drug Use Not Currently   Types: Marijuana   Comment: Patiaent states that she eats THC gummies    Social History   Socioeconomic History   Marital status: Single    Spouse name: Not on file   Number of children: Not on file   Years of education: Not on file   Highest education level: Not on file  Occupational History   Not on file  Tobacco Use   Smoking status: Some Days    Packs/day: 0.10    Types: Cigarettes   Smokeless tobacco: Never  Vaping Use   Vaping Use: Every day   Substances: Nicotine, Flavoring  Substance and Sexual Activity   Alcohol use: Not Currently    Comment: 1 per month   Drug use: Not Currently    Types: Marijuana    Comment: Patiaent states that she  eats THC gummies   Sexual activity: Yes    Partners: Male    Birth control/protection: None  Other Topics Concern   Not on file  Social History Narrative   Not on file   Social Determinants of Health   Financial Resource Strain: Not on file  Food Insecurity: Not on file  Transportation Needs: Not on file  Physical Activity: Not on file  Stress: Not on file  Social Connections: Not on file   SDOH:  SDOH Screenings   Alcohol Screen: Low Risk    Last Alcohol Screening Score (AUDIT): 1  Depression (PHQ2-9): Low Risk    PHQ-2 Score: 3  Financial Resource Strain: Not on file  Food Insecurity: Not on file  Housing: Not on file  Physical Activity: Not on file   Social Connections: Not on file  Stress: Not on file  Tobacco Use: High Risk   Smoking Tobacco Use: Some Days   Smokeless Tobacco Use: Never  Transportation Needs: Not on file   Additional Social History:     Sleep: Fair  Appetite:  Fair  Current Medications:  Current Facility-Administered Medications  Medication Dose Route Frequency Provider Last Rate Last Admin   alum & mag hydroxide-simeth (MAALOX/MYLANTA) 200-200-20 MG/5ML suspension 30 mL  30 mL Oral Q4H PRN White, Patrice L, NP       hydrOXYzine (ATARAX/VISTARIL) tablet 25 mg  25 mg Oral TID PRN White, Patrice L, NP   25 mg at 02/09/21 1324   ibuprofen (ADVIL) tablet 600 mg  600 mg Oral Q6H PRN Lindon Romp A, NP   600 mg at 02/09/21 0758   loperamide (IMODIUM) capsule 2 mg  2 mg Oral TID PRN Bobbitt, Shalon E, NP   2 mg at 02/08/21 0508   magnesium hydroxide (MILK OF MAGNESIA) suspension 30 mL  30 mL Oral Daily PRN White, Patrice L, NP   30 mL at 02/09/21 1324   nicotine (NICODERM CQ - dosed in mg/24 hours) patch 21 mg  21 mg Transdermal Daily Derrill Center, NP   21 mg at 02/09/21 0756   Oxcarbazepine (TRILEPTAL) tablet 300 mg  300 mg Oral BID White, Patrice L, NP   300 mg at 02/09/21 0757   prazosin (MINIPRESS) capsule 1 mg  1 mg Oral QHS Ival Bible, MD   1 mg at 02/08/21 2116   traZODone (DESYREL) tablet 50 mg  50 mg Oral QHS PRN White, Patrice L, NP   50 mg at 02/08/21 2118   Current Outpatient Medications  Medication Sig Dispense Refill   acetaminophen (TYLENOL 8 HOUR) 650 MG CR tablet Take 1 tablet (650 mg total) by mouth every 8 (eight) hours as needed for pain. 60 tablet 0   ARIPiprazole ER (ABILIFY MAINTENA) 400 MG SRER injection Inject 2 mLs (400 mg total) into the muscle every 28 (twenty-eight) days. 1 each 3   hydrOXYzine (ATARAX/VISTARIL) 25 MG tablet Take 1 tablet (25 mg total) by mouth 3 (three) times daily as needed. 60 tablet 1   Oxcarbazepine (TRILEPTAL) 300 MG tablet Take 1 tablet (300 mg total)  by mouth 2 (two) times daily. 60 tablet 1    Labs  Lab Results:  Admission on 02/05/2021  Component Date Value Ref Range Status   TSH 02/05/2021 2.926  0.350 - 4.500 uIU/mL Final   Comment: Performed by a 3rd Generation assay with a functional sensitivity of <=0.01 uIU/mL. Performed at [facility], [place] [address], [place], [place] [address]  Admission on 02/04/2021, Discharged on 02/05/2021  Component Date Value Ref Range Status   SARS Coronavirus 2 by RT PCR 02/04/2021 NEGATIVE  NEGATIVE Final   Comment: (NOTE) SARS-CoV-2 target nucleic acids are NOT DETECTED.  The SARS-CoV-2 RNA is generally detectable in upper respiratory specimens during the acute phase of infection. The lowest concentration of SARS-CoV-2 viral copies this assay can detect is 138 copies/mL. A negative result does not preclude SARS-Cov-2 infection and should not be used as the sole basis for treatment or other patient management decisions. A negative result may occur with  improper specimen collection/handling, submission of specimen other than nasopharyngeal swab, presence of viral mutation(s) within the areas targeted by this assay, and inadequate number of viral copies(<138 copies/mL). A negative result must be combined with clinical observations, patient history, and epidemiological information. The expected result is Negative.  Fact Sheet for Patients:  EntrepreneurPulse.com.au  Fact Sheet for Healthcare Providers:  IncredibleEmployment.be  This test is no                          t yet approved or cleared by the [place] FDA and  has been authorized for detection and/or diagnosis of SARS-CoV-2 by FDA under an Emergency Use Authorization (EUA). This EUA will remain  in effect (meaning this test can be used) for the duration of the COVID-19 declaration under Section 564(b)(1) of the Act, 21 U.S.C.section 360bbb-3(b)(1), unless the authorization is  terminated  or revoked sooner.       Influenza A by PCR 02/04/2021 NEGATIVE  NEGATIVE Final   Influenza B by PCR 02/04/2021 NEGATIVE  NEGATIVE Final   Comment: (NOTE) The Xpert Xpress SARS-CoV-2/FLU/RSV plus assay is intended as an aid in the diagnosis of influenza from Nasopharyngeal swab specimens and should not be used as a sole basis for treatment. Nasal washings and aspirates are unacceptable for Xpert Xpress SARS-CoV-2/FLU/RSV testing.  Fact Sheet for Patients: EntrepreneurPulse.com.au  Fact Sheet for Healthcare Providers: IncredibleEmployment.be  This test is not yet approved or cleared by the [place] FDA and has been authorized for detection and/or diagnosis of SARS-CoV-2 by FDA under an Emergency Use Authorization (EUA). This EUA will remain in effect (meaning this test can be used) for the duration of the COVID-19 declaration under Section 564(b)(1) of the Act, 21 U.S.C. section 360bbb-3(b)(1), unless the authorization is terminated or revoked.  Performed at [facility], [place] [address], [place], [place] [address]    Sodium 02/04/2021 140  135 - 145 mmol/L Final   Potassium 02/04/2021 3.9  3.5 - 5.1 mmol/L Final   Chloride 02/04/2021 106  98 - 111 mmol/L Final   CO2 02/04/2021 25  22 - 32 mmol/L Final   Glucose, Bld 02/04/2021 105 (A) 70 - 99 mg/dL Final   Glucose reference range applies only to samples taken after fasting for at least 8 hours.   BUN 02/04/2021 14  6 - 20 mg/dL Final   Creatinine, Ser 02/04/2021 0.65  0.44 - 1.00 mg/dL Final   Calcium 02/04/2021 9.0  8.9 - 10.3 mg/dL Final   Total Protein 02/04/2021 7.0  6.5 - 8.1 g/dL Final   Albumin 02/04/2021 4.1  3.5 - 5.0 g/dL Final   AST 02/04/2021 149 (A) 15 - 41 U/L Final   ALT 02/04/2021 97 (A) 0 - 44 U/L Final   Alkaline Phosphatase 02/04/2021 72  38 - 126 U/L Final   Total Bilirubin 02/04/2021 1.3 (A) 0.3 -  1.2 mg/dL Final   GFR, Estimated  02/04/2021 >60  >60 mL/min Final   Comment: (NOTE) Calculated using the CKD-EPI Creatinine Equation (2021)    Anion gap 02/04/2021 9  5 - 15 Final   Performed at [facility], [place] [address], [place], [place] [address]   Alcohol, Ethyl (B) 02/04/2021 <10  <10 mg/dL Final   Comment: (NOTE) Lowest detectable limit for serum alcohol is 10 mg/dL.  For medical purposes only. Performed at [facility], [place] [address] [place], [place] [address]    Opiates 02/05/2021 NONE DETECTED  NONE DETECTED Final   Cocaine 02/05/2021 NONE DETECTED  NONE DETECTED Final   Benzodiazepines 02/05/2021 NONE DETECTED  NONE DETECTED Final   Amphetamines 02/05/2021 NONE DETECTED  NONE DETECTED Final   Tetrahydrocannabinol 02/05/2021 POSITIVE (A) NONE DETECTED Final   Barbiturates 02/05/2021 NONE DETECTED  NONE DETECTED Final   Comment: (NOTE) DRUG SCREEN FOR MEDICAL PURPOSES ONLY.  IF CONFIRMATION IS NEEDED FOR ANY PURPOSE, NOTIFY LAB WITHIN 5 DAYS.  LOWEST DETECTABLE LIMITS FOR URINE DRUG SCREEN Drug Class                     Cutoff (ng/mL) Amphetamine and metabolites    1000 Barbiturate and metabolites    200 Benzodiazepine                 562 Tricyclics and metabolites     300 Opiates and metabolites        300 Cocaine and metabolites        300 THC                            50 Performed at [facility], [place] [address] [place], [place] [address]    WBC 02/04/2021 14.9 (A) 4.0 - 10.5 K/uL Final   RBC 02/04/2021 4.26  3.87 - 5.11 MIL/uL Final   Hemoglobin 02/04/2021 12.3  12.0 - 15.0 g/dL Final   HCT 02/04/2021 37.3  36.0 - 46.0 % Final   MCV 02/04/2021 87.6  80.0 - 100.0 fL Final   MCH 02/04/2021 28.9  26.0 - 34.0 pg Final   MCHC 02/04/2021 33.0  30.0 - 36.0 g/dL Final   RDW 02/04/2021 13.6  11.5 - 15.5 % Final   Platelets 02/04/2021 288  150 - 400 K/uL Final   nRBC 02/04/2021 0.0  0.0 - 0.2 % Final   Neutrophils Relative % 02/04/2021  78  % Final   Neutro Abs 02/04/2021 11.6 (A) 1.7 - 7.7 K/uL Final   Lymphocytes Relative 02/04/2021 13  % Final   Lymphs Abs 02/04/2021 1.9  0.7 - 4.0 K/uL Final   Monocytes Relative 02/04/2021 8  % Final   Monocytes Absolute 02/04/2021 1.2 (A) 0.1 - 1.0 K/uL Final   Eosinophils Relative 02/04/2021 0  % Final   Eosinophils Absolute 02/04/2021 0.0  0.0 - 0.5 K/uL Final   Basophils Relative 02/04/2021 0  % Final   Basophils Absolute 02/04/2021 0.0  0.0 - 0.1 K/uL Final   Immature Granulocytes 02/04/2021 1  % Final   Abs Immature Granulocytes 02/04/2021 0.09 (A) 0.00 - 0.07 K/uL Final   Performed at [facility] [place] [address], [place], [place] [address]   I-stat hCG, quantitative 02/04/2021 <5.0  <5 mIU/mL Final   Comment 3 02/04/2021          Final   Comment:   GEST.  AGE      CONC.  (mIU/mL)   <=1 WEEK        5 - 50     2 WEEKS       50 - 500     3 WEEKS       100 - 10,000     4 WEEKS     1,000 - 30,000        FEMALE AND NON-PREGNANT FEMALE:     LESS THAN 5 mIU/mL    Hgb A1c MFr Bld 02/04/2021 5.5  4.8 - 5.6 % Final   Comment: (NOTE) Pre diabetes:          5.7%-6.4%  Diabetes:              >6.4%  Glycemic control for   <7.0% adults with diabetes    Mean Plasma Glucose 02/04/2021 111.15  mg/dL Final   Performed at [facility], [place] [address], [place], [place] [address]   Cholesterol 02/04/2021 213 (A) 0 - 200 mg/dL Final   Triglycerides 02/04/2021 58  <150 mg/dL Final   HDL 02/04/2021 63  >40 mg/dL Final   Total CHOL/HDL Ratio 02/04/2021 3.4  RATIO Final   VLDL 02/04/2021 12  0 - 40 mg/dL Final   LDL Cholesterol 02/04/2021 138 (A) 0 - 99 mg/dL Final   Comment:        Total Cholesterol/HDL:CHD Risk Coronary Heart Disease Risk Table                     Men   Women  1/2 Average Risk   3.4   3.3  Average Risk       5.0   4.4  2 X Average Risk   9.6   7.1  3 X Average Risk  23.4   11.0        Use the calculated Patient Ratio above and the CHD Risk  Table to determine the patient's CHD Risk.        ATP III CLASSIFICATION (LDL):  <100     mg/dL   Optimal  100-129  mg/dL   Near or Above                    Optimal  130-159  mg/dL   Borderline  160-189  mg/dL   High  >190     mg/dL   Very High Performed at [place] [address] [place], [place] [address]   Office Visit on 10/23/2020  Component Date Value Ref Range Status   WBC 10/23/2020 9.0  3.4 - 10.8 x10E3/uL Final   RBC 10/23/2020 4.46  3.77 - 5.28 x10E6/uL Final   Hemoglobin 10/23/2020 12.7  11.1 - 15.9 g/dL Final   Hematocrit 10/23/2020 39.4  34.0 - 46.6 % Final   MCV 10/23/2020 88  79 - 97 fL Final   MCH 10/23/2020 28.5  26.6 - 33.0 pg Final   MCHC 10/23/2020 32.2  31.5 - 35.7 g/dL Final   RDW 10/23/2020 14.2  11.7 - 15.4 % Final   Neutrophils 10/23/2020 61  Not Estab. % Final   Lymphs 10/23/2020 28  Not Estab. % Final   Monocytes 10/23/2020 7  Not Estab. % Final   Eos 10/23/2020 3  Not Estab. % Final   Basos 10/23/2020 1  Not Estab. % Final   Neutrophils Absolute 10/23/2020 5.6  1.4 - 7.0 x10E3/uL Final   Lymphocytes Absolute 10/23/2020 2.5  0.7 -  3.1 x10E3/uL Final   Monocytes Absolute 10/23/2020 0.6  0.1 - 0.9 x10E3/uL Final   EOS (ABSOLUTE) 10/23/2020 0.2  0.0 - 0.4 x10E3/uL Final   Basophils Absolute 10/23/2020 0.1  0.0 - 0.2 x10E3/uL Final   Immature Granulocytes 10/23/2020 0  Not Estab. % Final   Immature Grans (Abs) 10/23/2020 0.0  0.0 - 0.1 x10E3/uL Final   Glucose 10/23/2020 90  65 - 99 mg/dL Final   BUN 10/23/2020 10  6 - 24 mg/dL Final   Creatinine, Ser 10/23/2020 0.68  0.57 - 1.00 mg/dL Final   eGFR 10/23/2020 108  >59 mL/min/1.73 Final   BUN/Creatinine Ratio 10/23/2020 15  9 - 23 Final   Sodium 10/23/2020 137  134 - 144 mmol/L Final   Potassium 10/23/2020 4.5  3.5 - 5.2 mmol/L Final   Chloride 10/23/2020 101  96 - 106 mmol/L Final   CO2 10/23/2020 21  20 - 29 mmol/L Final   Calcium 10/23/2020 9.1  8.7 - 10.2 mg/dL Final    Total Protein 10/23/2020 6.7  6.0 - 8.5 g/dL Final   Albumin 10/23/2020 4.3  3.8 - 4.8 g/dL Final   Globulin, Total 10/23/2020 2.4  1.5 - 4.5 g/dL Final   Albumin/Globulin Ratio 10/23/2020 1.8  1.2 - 2.2 Final   Bilirubin Total 10/23/2020 0.3  0.0 - 1.2 mg/dL Final   Alkaline Phosphatase 10/23/2020 86  44 - 121 IU/L Final   AST 10/23/2020 17  0 - 40 IU/L Final   ALT 10/23/2020 20  0 - 32 IU/L Final   Sed Rate 10/23/2020 21  0 - 32 mm/hr Final   CRP 10/23/2020 1  0 - 10 mg/L Final   Lipase 10/23/2020 38  14 - 72 U/L Final  Procedure visit on 09/17/2020  Component Date Value Ref Range Status   Preg Test, Ur 09/17/2020 Negative  Negative Final   High risk HPV 09/17/2020 Negative   Final   Adequacy 09/17/2020 Satisfactory for evaluation; transformation zone component PRESENT.   Final   Diagnosis 09/17/2020 - Atypical squamous cells of undetermined significance (ASC-US) (A)  Final   Comment 09/17/2020 Normal Reference Range HPV - Negative   Final    Blood Alcohol level:  Lab Results  Component Value Date   ETH <10 02/04/2021   ETH <10 30/12/6224    Metabolic Disorder Labs: Lab Results  Component Value Date   HGBA1C 5.5 02/04/2021   MPG 111.15 02/04/2021   MPG 108.28 07/31/2020   Lab Results  Component Value Date   PROLACTIN 39.5 (H) 04/12/2018   PROLACTIN 2.0 12/05/2011   Lab Results  Component Value Date   CHOL 213 (H) 02/04/2021   TRIG 58 02/04/2021   HDL 63 02/04/2021   CHOLHDL 3.4 02/04/2021   VLDL 12 02/04/2021   LDLCALC 138 (H) 02/04/2021   LDLCALC 187 (H) 07/31/2020    Therapeutic Lab Levels: Lab Results  Component Value Date   LITHIUM 0.94 11/10/2018   LITHIUM 0.48 (L) 11/05/2018   No results found for: VALPROATE No components found for:  CBMZ  Physical Findings   AIMS    Flowsheet Row Admission (Discharged) from 06/10/2020 in [facility] Admission (Discharged) from 05/14/2020 in [facility]  Admission (Discharged) from OP Visit from 08/08/2019 in [facility] Admission (Discharged) from 10/16/2018 in [facility] Admission (Discharged) from 10/08/2018 in [facility] [place] [facility]  AIMS Total Score 0 0 0 0 0  AUDIT    Flowsheet Row Admission (Discharged) from 06/10/2020 in [facility] Admission (Discharged) from 05/14/2020 in [facility] Admission (Discharged) from 09/25/2019 in [facility] Admission (Discharged) from OP Visit from 08/08/2019 in [facility] Admission (Discharged) from 10/31/2018 in [facility]  Alcohol Use Disorder Identification Test Final Score (AUDIT) '1 3 4 4 3      ' PHQ2-9    [place] Office Visit from 10/23/2020 in [facility] Office Visit from 07/22/2020 in [facility] Office Visit from 02/19/2020 in [facility] Office Visit from 04/14/2017 in [facility] Office Visit from 03/06/2017 in [facility]  PHQ-2 Total Score 0 1 0 0 0  PHQ-9 Total Score '3 5 2 ' -- --      Flowsheet Row ED from 02/05/2021 in [facility] ED from 02/04/2021 in [facility] DEPT ED from 07/31/2020 in [place] No Risk High Risk Moderate Risk        Musculoskeletal  Strength & Muscle Tone: within normal limits Gait & Station: normal Patient leans: N/A  Psychiatric Specialty Exam  Presentation  General Appearance: Appropriate for Environment; Casual  Eye Contact:Fair  Speech:Clear and Coherent; Normal Rate  Speech Volume:Normal  Handedness:Right   Mood and Affect  Mood:Anxious  Affect:Appropriate; Congruent   Thought Process  Thought Processes:Coherent; Goal Directed;  Linear  Descriptions of Associations:Intact  Orientation:Full (Time, Place and Person)  Thought Content:WDL; Logical  Diagnosis of Schizophrenia or Schizoaffective disorder in past: No  Duration of Psychotic Symptoms: n/a   Hallucinations:Hallucinations: None  Ideas of Reference:None  Suicidal Thoughts:Suicidal Thoughts: No  Homicidal Thoughts:Homicidal Thoughts: No   Sensorium  Memory:Immediate Good; Recent Good; Remote Good  Judgment:Good  Insight:Good   Executive Functions  Concentration:Good  Attention Span:Good  [place] of Knowledge:Good  Language:Good   Psychomotor Activity  Psychomotor Activity:Psychomotor Activity: Normal   Assets  Assets:Communication Skills; Desire for Improvement; Resilience   Sleep  Sleep:Sleep: Good   No data recorded  Physical Exam  Physical Exam HENT:     Head: Normocephalic.  Eyes:     Conjunctiva/sclera: Conjunctivae normal.  Pulmonary:     Effort: Pulmonary effort is normal.  Neurological:     General: No focal deficit present.     Mental Status: She is alert and oriented to person, place, and time.  Psychiatric:        Behavior: Behavior normal.        Thought Content: Thought content normal.   Review of Systems  Constitutional:  Negative for chills and fever.  HENT:  Negative for hearing loss.   Eyes:  Negative for discharge and redness.  Respiratory:  Negative for cough.   Cardiovascular:  Negative for chest pain.  Gastrointestinal:  Positive for constipation.  Genitourinary: Negative.   Musculoskeletal: Negative.  Negative for myalgias.  Neurological:  Negative for headaches.  Psychiatric/Behavioral:  Positive for depression.   Blood pressure 127/80, pulse 95, temperature 98 F (36.7 C), temperature source Oral, resp. rate 16, SpO2 100 %. There is no height or weight on file to calculate BMI.  Treatment Plan Summary: Daily contact with patient to assess and evaluate symptoms and  progress in treatment and Medication management  50 yo female with history of bipolar disorder who presented to [facility] ED with SI and then  was transferred to [facility] for continued treatment. Patient no longer reporting SI and appears to be overall improving. Patient continues to meet critera for treatment at the [facility]. Patient likely to be discharged in the next couple days with continued improvement.  Bipolar disorder -abilify maintenna LAI-next due septemeber 19 -continue trileptal 300 mg BID for mood stabilization  Anxiety -continue TID vistaril 25 mg PRN  PTSD -continue prazosin 1 mg qhs for nightmares; has been tolerating well  Dispo: ongoing. SW assisting-possible shelter vs rehab/substance use treatment    Ival Bible, MD 02/09/2021 2:33 PM

## 2021-02-09 NOTE — ED Notes (Signed)
Pt is in the dining room watching television 

## 2021-02-10 ENCOUNTER — Ambulatory Visit: Payer: 59 | Admitting: Physician Assistant

## 2021-02-10 MED ORDER — HYDROXYZINE HCL 25 MG PO TABS: 25.0000 mg | ORAL_TABLET | Freq: Three times a day (TID) | ORAL | 0 refills | Status: DC | PRN

## 2021-02-10 MED ORDER — OXCARBAZEPINE 300 MG PO TABS: 300.0000 mg | ORAL_TABLET | Freq: Two times a day (BID) | ORAL | 0 refills | Status: DC

## 2021-02-10 MED ORDER — PRAZOSIN HCL 1 MG PO CAPS: 1.0000 mg | ORAL_CAPSULE | Freq: Every day | ORAL | 0 refills | Status: DC
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
## 2021-02-10 NOTE — Discharge Instructions (Signed)
Patient is instructed prior to discharge to: Take all medications as prescribed by his/her mental healthcare provider. Report any adverse effects and or reactions from the medicines to his/her outpatient provider promptly. Patient has been instructed & cautioned: To not engage in alcohol and or illegal drug use while on prescription medicines. In the event of worsening symptoms, patient is instructed to call the crisis hotline, 911 and or go to the nearest ED for appropriate evaluation and treatment of symptoms. To follow-up with his/her primary care provider for your other medical issues, concerns and or health care needs.     Please come to [facility] (this facility) during walk in hours for appointment with psychiatrist for further medication management and for therapists for therapy.    Walk in hours are 8-11 AM Monday through Thursday for medication management. Therapy walk in hours are Monday-Wednesday 8 AM-1PM.   It is first come, first -serve; it is best to arrive by 7:00 AM.   On Friday from 1 pm to 4 pm for therapy intake only. Please arrive by 12:00 pm as it is  first come, first -serve.    When you arrive please go upstairs for your appointment. If you are unsure of where to go, inform the front desk that you are here for a walk in appointment and they will assist you with directions upstairs.  Address:  [address], in [place], [address] Ph: [phone]   

## 2021-02-10 NOTE — ED Notes (Addendum)
Patient discharged from [facility] with an After Summary Visit  that contain Follow up appointment, and sample medications. All belongings given back to patient and belongings sheet signed by patient. Patient denies SI/HI and AVH at the time of discharge with pain score of zero. Patient discharged home to the [facility] in [place]. Patient transported by [facility].

## 2021-02-10 NOTE — ED Notes (Signed)
Patient participated in wrap up Group this evening and  expressed that she is Thankful to be here and only sees' this as a positive step forward in her life and is only going to be positive from this day forward. She Is so Thankful for the Staff and is happy she has therapy and will continue to be positive.

## 2021-02-10 NOTE — Clinical Social Work Psych Note (Signed)
CSW Update   CSW met with patient to discuss discharge.   Tattianna reports she is "elated" with discharging to an [facility]. She reports she does not want anyone to know where she is discharging to. She reports she "feels good" about her alternative living arrangement and plans to focus on starting school in 2 weeks and finding new employment.   Jaliza was agreeable with following up with the [facility] for outpatient psychiatric services.   Gladyes requested assistance with transportation at D/C. CSW informed her that [facility] would be arranged on her behalf.   Farron denied having any additional questions or concerns at this time.   CSW signing off.     Radonna Ricker, MSW, LCSW Clinical Education officer, museum ([place]) [facility]

## 2021-02-10 NOTE — ED Notes (Signed)
Pt is in bed sleeping, respirations are even/unlabored, environment check complete secure, will continue to monitor patient for safety 

## 2021-02-10 NOTE — Progress Notes (Signed)
Patient is alert and oriented X 4. Patient denies SI, HI, and AVH at this time. Patient informed RN of discharge plan today.Patient does not exhibit any signs of symptoms of auditory or visual hallucinations. Nursing staff will continue to monitor.

## 2021-02-10 NOTE — ED Provider Notes (Signed)
FBC/OBS ASAP Discharge Summary  Date and Time: 02/10/2021 8:49 AM  Name: Kelly Carr  MRN:  [id number]   Discharge Diagnoses:  Final diagnoses:  Bipolar I disorder with depression (HCC)    Subjective:  Patient seen and chart reviewed. Objectively, affect Is much brighter than yesterday. She describes her mood as "wonderful" and reports sleeping well, reports that she feels that prazosin has been helpful for nightmares and sleep. She states that she has been accepted to the [facility] and expresses excitement. She denies SI/HI/AVH. She expresses that her stay at the [facility] has been helpful and indicates that she plans on using skills learned in the groups after discharge. She states that she would like to follow [facility] but that she would like to establish care with a therapist at the [facility].   Stay Summary:  Patient presented to [facility] on 9/1 with depression and passive SI. She was accepted to the [facility] on 9/2 for continued treatment. She was continued on her  home medications of trileptal 300 mg BID and vistaril 25 mg TID PRN; she also receives abilify maintenna LAI with next dose due 9/19. On 9/5, pt was started on prazosin 1 mg qhs for PTSD associated nightmares . She tolerated medication well without SE and reported improvement in sleep and denied nightmares. She was accepted to [facility] on the evening of 9/6, but due to timing of acceptance and lack of transportation patient was discharged to [facility] on the morning of 9/7. On 9/7, day of discharge, patient denied SI/HI/AVH and reported excitement about discharge to [facility]-see above for more details. Patient was provided with 7 day samples for medication and  paper scripts for 30 days worth of medications.   Total Time spent with patient: 20 minutes  Past Psychiatric History: see H&P Past Medical History:  Past Medical History:  Diagnosis Date   Abnormal  Pap smear    Unknown results>colpo>normal   Anxiety    Arthritis    Asthma    Bipolar 1 disorder (HCC)    Depression    PTSD (post-traumatic stress disorder)     Past Surgical History:  Procedure Laterality Date   NO PAST SURGERIES     Family History:  Family History  Problem Relation Age of Onset   Diabetes Mother    Hypertension Mother    Heart disease Mother 63   Schizophrenia Mother    Diabetes Maternal Grandmother    Heart disease Maternal Grandmother    Diabetes Maternal Grandfather    Heart disease Maternal Grandfather    Depression Daughter    Bipolar disorder Cousin    Bipolar disorder Nephew    Family Psychiatric History: see H&P Social History:  Social History   Substance and Sexual Activity  Alcohol Use Not Currently   Comment: 1 per month     Social History   Substance and Sexual Activity  Drug Use Not Currently   Types: Marijuana   Comment: Patiaent states that she eats THC gummies    Social History   Socioeconomic History   Marital status: Single    Spouse name: Not on file   Number of children: Not on file   Years of education: Not on file   Highest education level: Not on file  Occupational History   Not on file  Tobacco Use   Smoking status: Some Days    Packs/day: 0.10    Types: Cigarettes  Smokeless tobacco: Never  Vaping Use   Vaping Use: Every day   Substances: Nicotine, Flavoring  Substance and Sexual Activity   Alcohol use: Not Currently    Comment: 1 per month   Drug use: Not Currently    Types: Marijuana    Comment: Patiaent states that she eats THC gummies   Sexual activity: Yes    Partners: Male    Birth control/protection: None  Other Topics Concern   Not on file  Social History Narrative   Not on file   Social Determinants of Health   Financial Resource Strain: Not on file  Food Insecurity: Not on file  Transportation Needs: Not on file  Physical Activity: Not on file  Stress: Not on file  Social  Connections: Not on file   SDOH:  SDOH Screenings   Alcohol Screen: Low Risk    Last Alcohol Screening Score (AUDIT): 1  Depression (PHQ2-9): Low Risk    PHQ-2 Score: 3  Financial Resource Strain: Not on file  Food Insecurity: Not on file  Housing: Not on file  Physical Activity: Not on file  Social Connections: Not on file  Stress: Not on file  Tobacco Use: High Risk   Smoking Tobacco Use: Some Days   Smokeless Tobacco Use: Never  Transportation Needs: Not on file    Tobacco Cessation:  Prescription not provided because: n/a  Current Medications:  Current Facility-Administered Medications  Medication Dose Route Frequency Provider Last Rate Last Admin   alum & mag hydroxide-simeth (MAALOX/MYLANTA) 200-200-20 MG/5ML suspension 30 mL  30 mL Oral Q4H PRN White, Patrice L, NP       hydrOXYzine (ATARAX/VISTARIL) tablet 25 mg  25 mg Oral TID PRN White, Patrice L, NP   25 mg at 02/09/21 2124   ibuprofen (ADVIL) tablet 600 mg  600 mg Oral Q6H PRN Nira Conn A, NP   600 mg at 02/09/21 1946   magnesium hydroxide (MILK OF MAGNESIA) suspension 30 mL  30 mL Oral Daily PRN White, Patrice L, NP   30 mL at 02/09/21 1324   nicotine (NICODERM CQ - dosed in mg/24 hours) patch 21 mg  21 mg Transdermal Daily Oneta Rack, NP   21 mg at 02/09/21 0756   Oxcarbazepine (TRILEPTAL) tablet 300 mg  300 mg Oral BID White, Patrice L, NP   300 mg at 02/09/21 2123   prazosin (MINIPRESS) capsule 1 mg  1 mg Oral QHS Estella Husk, MD   1 mg at 02/09/21 2124   traZODone (DESYREL) tablet 50 mg  50 mg Oral QHS PRN White, Patrice L, NP   50 mg at 02/09/21 2123   Current Outpatient Medications  Medication Sig Dispense Refill   acetaminophen (TYLENOL 8 HOUR) 650 MG CR tablet Take 1 tablet (650 mg total) by mouth every 8 (eight) hours as needed for pain. 60 tablet 0   ARIPiprazole ER (ABILIFY MAINTENA) 400 MG SRER injection Inject 2 mLs (400 mg total) into the muscle every 28 (twenty-eight) days. 1 each 3    hydrOXYzine (ATARAX/VISTARIL) 25 MG tablet Take 1 tablet (25 mg total) by mouth 3 (three) times daily as needed. 60 tablet 1   Oxcarbazepine (TRILEPTAL) 300 MG tablet Take 1 tablet (300 mg total) by mouth 2 (two) times daily. 60 tablet 1    PTA Medications: (Not in a hospital admission)   Musculoskeletal  Strength & Muscle Tone: within normal limits Gait & Station: normal Patient leans: N/A  Psychiatric Specialty Exam  Presentation  General Appearance: Appropriate for Environment; Casual  Eye Contact:Fair  Speech:Clear and Coherent; Normal Rate  Speech Volume:Normal  Handedness:Right   Mood and Affect  Mood:Anxious  Affect:Appropriate; Congruent   Thought Process  Thought Processes:Coherent; Goal Directed; Linear  Descriptions of Associations:Intact  Orientation:Full (Time, Place and Person)  Thought Content:WDL; Logical  Diagnosis of Schizophrenia or Schizoaffective disorder in past: No  Duration of Psychotic Symptoms: Greater than six months   Hallucinations:Hallucinations: None  Ideas of Reference:None  Suicidal Thoughts:Suicidal Thoughts: No  Homicidal Thoughts:Homicidal Thoughts: No   Sensorium  Memory:Immediate Good; Recent Good; Remote Good  Judgment:Good  Insight:Good   Executive Functions  Concentration:Good  Attention Span:Good  Recall:Good  Fund of Knowledge:Good  Language:Good   Psychomotor Activity  Psychomotor Activity:Psychomotor Activity: Normal   Assets  Assets:Communication Skills; Desire for Improvement; Resilience   Sleep  Sleep:Sleep: Good   No data recorded  Physical Exam  See SRA for physical exam and ROS  Blood pressure 128/80, pulse (!) 108, temperature 98 F (36.7 C), temperature source Oral, resp. rate 16, SpO2 98 %. There is no height or weight on file to calculate BMI.  See SRA for full suicide risk assessment  Plan Of Care/Follow-up recommendations:  Activity:  as tolerated Diet:   regular Other:     Patient is instructed prior to discharge to: Take all medications as prescribed by his/her mental healthcare provider. Report any adverse effects and or reactions from the medicines to his/her outpatient provider promptly. Patient has been instructed & cautioned: To not engage in alcohol and or illegal drug use while on prescription medicines. In the event of worsening symptoms, patient is instructed to call the crisis hotline, 911 and or go to the nearest ED for appropriate evaluation and treatment of symptoms. To follow-up with his/her primary care provider for your other medical issues, concerns and or health care needs.    Patient provided with 7 day samples and printed scripts for 30 days worth of medications.   Advised patient to follow up with [facility] regarding [facility] management.   Disposition: [facility] vis safe transport  Estella Husk, MD 02/10/2021, 8:49 AM

## 2021-02-11 ENCOUNTER — Other Ambulatory Visit: Payer: Self-pay

## 2021-02-11 ENCOUNTER — Ambulatory Visit (HOSPITAL_COMMUNITY)
Admission: EM | Admit: 2021-02-11 | Discharge: 2021-02-11 | Disposition: A | Discharge status: Home / Self Care | Payer: 59

## 2021-02-11 DIAGNOSIS — F319 Bipolar disorder, unspecified: Secondary | ICD-10-CM | POA: Diagnosis not present

## 2021-02-11 NOTE — Discharge Instructions (Addendum)
°  Discharge recommendations:  °Patient is to take medications as prescribed. °Please see information for follow-up appointment with psychiatry and therapy. °Please follow up with your primary care provider for all medical related needs.  ° °Therapy: We recommend that patient participate in individual therapy to address mental health concerns. ° °Medications: The patient is to contact a medical professional and/or outpatient provider to address any new side effects that develop. Patient should update outpatient providers of any new medications and/or medication changes.  ° °Atypical antipsychotics: If you are prescribed an atypical antipsychotic, it is recommended that your height, weight, BMI, blood pressure, fasting lipid panel, and fasting blood sugar be monitored by your outpatient providers. ° °Safety:  °The patient should abstain from use of illicit substances/drugs and abuse of any medications. °If symptoms worsen or do not continue to improve or if the patient becomes actively suicidal or homicidal then it is recommended that the patient return to the closest hospital emergency department, the [facility], or call 911 for further evaluation and treatment. °National Suicide Prevention Lifeline 1-800-SUICIDE or 1-800-273-8255. ° °About 988 °988 offers 24/7 access to trained crisis counselors who can help people experiencing mental health-related distress. People can call or text 988 or chat 988lifeline.org for themselves or if they are worried about a loved one who may need crisis support.  °

## 2021-02-11 NOTE — ED Provider Notes (Signed)
Behavioral Health Urgent Care Medical Screening Exam  Patient Name: Kelly Carr MRN: [id number] Date of Evaluation: 02/11/21 Chief Complaint:  "I have a lot going on." Diagnosis:  Final diagnoses:  Bipolar I disorder with depression (HCC)    History of Present illness: Kelly Carr is a 50 y.o. adult female with past psychiatric history significant for bipolar 1 disorder with depression who presents to the [facility] behavioral health urgent care [facility]) as a voluntary walk-in.  Patient states that she came to [facility] because "I have a lot going on. This place is annoying. My blood pressure has been off the roof".  Patient also states that she had intermittent nausea and vomiting yesterday.  She reports her nausea has improved over the past day. She denies hematemesis, chest pain, shortness of breath, headache, lightheadedness, dizziness, vision changes, abdominal pain, diarrhea, constipation, loss of consciousness, fever, chills, or any additional physical symptoms on exam at this time.  She reports that the last time she vomited was last night on 02/11/2021.  Patient's vital signs are stable: Blood pressure slightly elevated but not emergently elevated at 145/88, temp 98.7 F, pulse 99 bpm, respirations 18.  Per chart review, patient was admitted to [facility] facility based crisis [facility]) on 02/05/21 and was discharged from [facility] on 02/10/2021 to [facility] house with 7-day samples and provided 30-day prescriptions for Trileptal 300 mg p.o. twice daily for mood stability, Vistaril 25 mg p.o. 3 times daily as needed for anxiety, and Minipress 1 mg p.o. at bedtime for nightmares.  Upon 02/10/2021 [facility] discharge, it was also documented that patient was due for her next Abilify Maintenna LAI on 02/22/21 and patient was advised to follow up with her routine outpatient psychiatric provider at [facility] for continued psychotropic medication management.   Patient denies SI currently on exam or since she was discharged from  the behavioral health urgent care facility based crisis unit on 02/10/2021.  She denies any suicide attempts or self-injurious behaviors since she was discharged from the behavioral health urgent care facility based crisis unit on 02/10/2021.  She denies HI, AVH, paranoia, or delusions.  She reports that she has been sleeping well, about 7 hours per night.  She denies current anhedonia or current feelings of guilt/hopelessness/worthlessness.  She denies energy changes, concentration changes, appetite changes since she was recently discharged from the [facility] unit on 02/10/2021.  Patient states that she is still currently residing at the [place] house.  She states that she was brought to the [facility] this evening by some friends from [place] [facility] and she states that she will be returning back to [place] house this evening once she leaves [facility].  She reports that she has been taking her psychotropic medications as prescribed (Trileptal 300 mg twice daily, Vistaril 25 mg 3 times daily as needed, and Minipress 1 mg at bedtime) and she also states that she knows that she is due for her next Abilify Maintenna LAI on 02/22/2021.  Patient states that she is going to be following up with her established outpatient psychiatric provider at [facility] to continue her outpatient psychotropic medication management.  Patient states that she has an appointment already scheduled for medication management at [facility] but she states that she is not entirely sure when this appointment is.  Patient also states that she is going to be beginning therapy on the second floor of the [facility]. Patient denies access to firearms or weapons at this time.  Patient verbally contracts for safety with this  Clinical research associate.  She denies any alcohol, tobacco, or illicit substance use within the past 48 hours since her recent discharge from the [facility] behavioral health urgent care facility based crisis unit.  On exam, patient is  sitting comfortably in no acute distress.  Her mood is euthymic with congruent affect.  She is pleasant, alert and oriented x4, cooperative, and answers all questions appropriately throughout the evaluation.  No indication that the patient is responding to internal or external stimuli on exam.  Psychiatric Specialty Exam  Presentation  General Appearance:Appropriate for Environment; Well Groomed  Eye Contact:Good  Speech:Clear and Coherent; Normal Rate  Speech Volume:Normal  Handedness:Right   Mood and Affect  Mood:Euthymic  Affect:Congruent   Thought Process  Thought Processes:Coherent; Goal Directed; Linear  Descriptions of Associations:Intact  Orientation:Full (Time, Place and Person)  Thought Content:WDL; Logical  Diagnosis of Schizophrenia or Schizoaffective disorder in past: No  Duration of Psychotic Symptoms: Greater than six months  Hallucinations:None  Ideas of Reference:None  Suicidal Thoughts:No  Homicidal Thoughts:No   Sensorium  Memory:Immediate Good; Recent Good; Remote Good  Judgment:Good  Insight:Good   Executive Functions  Concentration:Fair  Attention Span:Fair  Recall:Good  Fund of Knowledge:Good  Language:Good   Psychomotor Activity  Psychomotor Activity:Normal   Assets  Assets:Communication Skills; Desire for Improvement; Financial Resources/Insurance; Housing; Leisure Time; Physical Health; Resilience; Social Support   Sleep  Sleep:Good  Number of hours: 7   No data recorded  Physical Exam: Physical Exam Vitals reviewed.  Constitutional:      General: She is not in acute distress.    Appearance: Normal appearance. She is not ill-appearing, toxic-appearing or diaphoretic.  HENT:     Head: Normocephalic and atraumatic.     Right Ear: External ear normal.     Left Ear: External ear normal.     Nose: Nose normal.  Eyes:     General:        Right eye: No discharge.        Left eye: No discharge.      Conjunctiva/sclera: Conjunctivae normal.  Cardiovascular:     Rate and Rhythm: Normal rate.  Pulmonary:     Effort: Pulmonary effort is normal. No respiratory distress.  Musculoskeletal:        General: Normal range of motion.     Cervical back: Normal range of motion.  Neurological:     General: No focal deficit present.     Mental Status: She is alert and oriented to person, place, and time.     Comments: No tremor noted.   Psychiatric:        Attention and Perception: She does not perceive auditory or visual hallucinations.        Mood and Affect: Mood and affect normal.        Speech: Speech normal.        Behavior: Behavior is not agitated, slowed, aggressive, withdrawn, hyperactive or combative. Behavior is cooperative.        Thought Content: Thought content is not paranoid or delusional. Thought content does not include homicidal or suicidal ideation.   Review of Systems  Constitutional:  Negative for chills, diaphoresis, fever, malaise/fatigue and weight loss.  HENT:  Negative for congestion.   Respiratory:  Negative for cough and shortness of breath.   Cardiovascular:  Negative for chest pain and palpitations.  Gastrointestinal:  Positive for nausea. Negative for abdominal pain, constipation, diarrhea and vomiting.  Musculoskeletal:  Negative for joint pain and myalgias.  Neurological:  Negative for  dizziness, loss of consciousness and headaches.  Psychiatric/Behavioral:  Negative for depression, hallucinations, memory loss and suicidal ideas. The patient is not nervous/anxious and does not have insomnia.   All other systems reviewed and are negative. Patient also states that she had intermittent nausea and vomiting yesterday.  She reports her nausea has improved over the past day. She denies hematemesis, chest pain, shortness of breath, headache, lightheadedness, dizziness, vision changes, abdominal pain, diarrhea, constipation, loss of consciousness, fever, chills, or any  additional physical symptoms on exam at this time.  She reports that the last time she vomited was last night on 02/11/2021.   Vitals: Blood pressure (!) 145/88, pulse 99, temperature 98.7 F (37.1 C), resp. rate 18, SpO2 97 %. There is no height or weight on file to calculate BMI.  Musculoskeletal: Strength & Muscle Tone: within normal limits Gait & Station: normal Patient leans: N/A   [facility] MSE Discharge Disposition for Follow up and Recommendations: Based on my evaluation the patient does not appear to have an emergency medical condition and can be discharged with resources and follow up care in outpatient services for Medication Management and Individual Therapy  Patient denies SI, HI, AVH, paranoia, or delusions at this time.  Patient is not psychotic on exam.  Patient is not an imminent risk/threat to herself or others at this time.  Patient does not meet inpatient psychiatric treatment criteria or [facility] FBC criteria at this time.   Recommend that patient continue to take her current psychotropic medication regimen as prescribed (Trileptal 300 mg twice daily for mood stability, Vistaril 25 mg 3 times daily as needed for anxiety, and Minipress 1 mg at bedtime for nightmares) Patient states that she knows that she is due for her next Abilify Maintenna LAI on 02/22/2021.  Recommend that patient continue outpatient psychiatric follow-up with her established provider at [facility] for continued medication management.  Patient states that she is going to be following up with her established outpatient psychiatric provider at [facility] to continue her outpatient psychotropic medication management.  Patient states that she has an appointment already scheduled [facility] but she states that she is not entirely sure when this appointment is.  Recommend that patient contact [facility] on 02/12/2021 to verify when her next outpatient psychotropic medication management appointment  is, as well as to inquire about receiving her next due Abilify Maintenna LAI (due 02/22/21) through [facility] either before or at her next appointment depending on when her next appointment is.  Patient also states that she is going to be beginning therapy on the second floor [facility].  Patient does have private health insurance [facility] [employer]  Thus, based on patient's health insurance status, it is possible the patient may not be a candidate for [facility] second-floor services.  Provided patient with a list of additional outpatient [facility] therapy facilities for patient to utilize to schedule a therapy appointment in the case that she is not able to follow up with therapy at the [facility] second-floor.  I do recommend that patient continue to attempt to receive therapy services through [facility] as her first line option if possible before exploring other options if necessary.  Patient states that she has not vomited since the evening of 02/11/2021. She reports her nausea has improved over the past day. As for patient's blood pressure and recent nausea/vomiting, patient's blood pressure is 145/88 (other vital signs  reassuring as well-see HPI for details) , this blood pressure value is not emergent at this time and based off of patient's lack of physical symptoms at this time correlated with patient's stable blood pressure, do not believe that patient's blood pressure or reported recent n/v within the past 24 hours is an emergent at issue at this time.  No further medical work-up needed for the patient at this time.  Patient may follow up with her outpatient primary care provider regarding her blood pressure.    Safety planning done at length with the patient regarding appropriate actions to take/resources to utilize [facility], nearest ED, 911, suicide prevention Lifeline) if the  patient becomes suicidal or homicidal, if the patient's condition rapidly deteriorates/worsen/does not improve, or if the patient begins to experience a mental health crisis.  Patient verbalizes understanding and agreement of the overall plan and recommendations, including safety plan.  All patient's questions answered and concerns addressed.  Patient discharged back to [facility] house (patient states that her friends that brought her to the [facility] will take her back to [place] house).   Jaclyn Shaggy, PA-C 02/11/2021, 8:44 PM

## 2021-02-11 NOTE — ED Notes (Signed)
Pt given EVS and discharge instructions explained. Pt understood and all questions answered.

## 2021-02-11 NOTE — Progress Notes (Signed)
   02/11/21 1915  [facility] Triage Screening (Walk-ins at [facility] only)  How Did You Hear About [place]? Self  What Is the Reason for Your Visit/Call Today? Kelly Carr is a 49yo reporting to [facility] for evaluation of symptoms that pt is experiencing after recent [facility] discharge (yesterday).  Pt reports that she is having racing heart, nausea, vomiting, diarrhea, nausea, and high anxiety. Pt denying any current SI, HI, AVH or substance use. 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  How Long Has This Been Causing You Problems? <Week  Have You Recently Had Any Thoughts About Hurting Yourself? No  How long ago did you have thoughts about hurting yourself? n/a  Are You Planning to Commit Suicide/Harm Yourself At This time? No  Have you Recently Had Thoughts About Hurting Someone [name]? No  Are You Planning To Harm Someone At This Time? No  Are you currently experiencing any auditory, visual or other hallucinations? No  Have You Used Any Alcohol or Drugs in the Past 24 Hours? No  How long ago did you use Drugs or Alcohol? pt denies  What Did You Use and How Much? pt denies  Do you have any current medical co-morbidities that require immediate attention? Yes  Please describe current medical co-morbidities that require immediate attention: pt is worried about blood pressure and other somatic complaints  Clinician description of patient physical appearance/behavior: pt is in a hurry--has someone from [facility] that drove her to [facility]  What Do You Feel Would Help You the Most Today? Alcohol or Drug Use Treatment;Treatment for Depression or other mood problem  If access to [facility] Urgent Care was not available, would you have sought care in the Emergency Department? Yes  Determination of Need Routine (7 days)  Options For Referral Medication Management

## 2021-02-16 ENCOUNTER — Ambulatory Visit: Payer: 59

## 2021-02-17 ENCOUNTER — Telehealth: Payer: Self-pay

## 2021-02-17 NOTE — Telephone Encounter (Signed)
Pt missed self swab appt unable to leave a message the vmail is full

## 2021-02-25 ENCOUNTER — Ambulatory Visit: Payer: 59 | Admitting: Family Medicine

## 2021-03-08 DIAGNOSIS — S90822A Blister (nonthermal), left foot, initial encounter: Secondary | ICD-10-CM | POA: Insufficient documentation

## 2021-03-15 ENCOUNTER — Ambulatory Visit: Payer: 59 | Admitting: Physician Assistant

## 2021-03-19 ENCOUNTER — Other Ambulatory Visit: Payer: Self-pay

## 2021-03-19 ENCOUNTER — Ambulatory Visit (HOSPITAL_COMMUNITY)
Admission: EM | Admit: 2021-03-19 | Discharge: 2021-03-19 | Disposition: A | Discharge status: Home / Self Care | Payer: 59 | Attending: Family | Admitting: Family

## 2021-03-19 DIAGNOSIS — F4321 Adjustment disorder with depressed mood: Secondary | ICD-10-CM

## 2021-03-19 MED ORDER — HYDROXYZINE HCL 25 MG PO TABS: 25.0000 mg | ORAL_TABLET | Freq: Three times a day (TID) | ORAL | 0 refills | Status: DC | PRN

## 2021-03-19 NOTE — Progress Notes (Signed)
CSW met with pt briefly to give resources for [employer], Selena Lesser, and [employer] and shelters. Pt also given bus pass. No other concerns expressed. Contact ended without incident.   Chalmers Guest. Guerry Bruin, MSW, [place], [place] 03/19/2021 10:28 AM

## 2021-03-19 NOTE — ED Notes (Signed)
Pt discharged with  AVS.  AVS reviewed prior to discharge.  Pt alert, oriented, and ambulatory.  Safety maintained.  °
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
## 2021-03-19 NOTE — Discharge Instructions (Addendum)
Patient is instructed prior to discharge to:  Take all medications as prescribed by his/her mental healthcare provider. Report any adverse effects and or reactions from the medicines to his/her outpatient provider promptly. Keep all scheduled appointments, to ensure that you are getting refills on time and to avoid any interruption in your medication.  If you are unable to keep an appointment call to reschedule.  Be sure to follow-up with resources and follow-up appointments provided.  Patient has been instructed & cautioned: To not engage in alcohol and or illegal drug use while on prescription medicines. In the event of worsening symptoms, patient is instructed to call the crisis hotline, 911 and or go to the nearest ED for appropriate evaluation and treatment of symptoms. To follow-up with his/her primary care provider for your other medical issues, concerns and or health care needs.   Homeless Shelter List:     [facility] ([facility])  [address] [place], [place]  Phone: [phone]      [facility] (Women only)  [address], [place], [place] [address]  Phone: [phone]     [facility]  [address] [place], [place] [address]  Phone: [phone]     [facility]:  [address]  [place], [place] [address]  Phone: [phone]     [facility]  [address], [place], [place] [address]  (Check in at 6:00PM for placement at a local shelter)  Phone: [phone]      

## 2021-03-24 ENCOUNTER — Telehealth (HOSPITAL_COMMUNITY): Payer: Self-pay | Admitting: Family Medicine

## 2021-03-24 NOTE — BH Assessment (Signed)
Care Management - Follow Up [facility] Discharges   Writer attempted to make contact with patient today and was unsuccessful.  Patient voice mail is not set up.  

## 2021-03-25 ENCOUNTER — Other Ambulatory Visit: Payer: Self-pay

## 2021-03-25 ENCOUNTER — Encounter (HOSPITAL_COMMUNITY): Payer: Self-pay | Admitting: Emergency Medicine

## 2021-03-25 ENCOUNTER — Ambulatory Visit (HOSPITAL_COMMUNITY)
Admission: EM | Admit: 2021-03-25 | Discharge: 2021-03-25 | Disposition: A | Discharge status: Home / Self Care | Payer: 59 | Attending: Emergency Medicine | Admitting: Emergency Medicine

## 2021-03-25 DIAGNOSIS — F17210 Nicotine dependence, cigarettes, uncomplicated: Secondary | ICD-10-CM | POA: Insufficient documentation

## 2021-03-25 DIAGNOSIS — J069 Acute upper respiratory infection, unspecified: Secondary | ICD-10-CM | POA: Diagnosis present

## 2021-03-25 DIAGNOSIS — Z8249 Family history of ischemic heart disease and other diseases of the circulatory system: Secondary | ICD-10-CM | POA: Insufficient documentation

## 2021-03-25 DIAGNOSIS — Z20822 Contact with and (suspected) exposure to covid-19: Secondary | ICD-10-CM | POA: Insufficient documentation

## 2021-03-25 DIAGNOSIS — Z885 Allergy status to narcotic agent status: Secondary | ICD-10-CM | POA: Insufficient documentation

## 2021-03-25 DIAGNOSIS — Z888 Allergy status to other drugs, medicaments and biological substances status: Secondary | ICD-10-CM | POA: Diagnosis not present

## 2021-03-25 DIAGNOSIS — Z881 Allergy status to other antibiotic agents status: Secondary | ICD-10-CM | POA: Diagnosis not present

## 2021-03-25 DIAGNOSIS — Z7952 Long term (current) use of systemic steroids: Secondary | ICD-10-CM | POA: Insufficient documentation

## 2021-03-25 LAB — POC INFLUENZA A AND B ANTIGEN (URGENT CARE ONLY)
INFLUENZA A ANTIGEN, POC: NEGATIVE
INFLUENZA B ANTIGEN, POC: NEGATIVE

## 2021-03-25 MED ORDER — GUAIFENESIN ER 600 MG PO TB12: 600.0000 mg | ORAL_TABLET | Freq: Two times a day (BID) | ORAL | 0 refills | Status: DC

## 2021-03-25 MED ORDER — ALBUTEROL SULFATE HFA 108 (90 BASE) MCG/ACT IN AERS: 2.0000 | INHALATION_SPRAY | RESPIRATORY_TRACT | 0 refills | Status: DC | PRN

## 2021-03-25 MED ORDER — BENZONATATE 100 MG PO CAPS: 100.0000 mg | ORAL_CAPSULE | Freq: Three times a day (TID) | ORAL | 0 refills | Status: DC

## 2021-03-25 MED ORDER — PREDNISONE 20 MG PO TABS: 40.0000 mg | ORAL_TABLET | Freq: Every day | ORAL | 0 refills | Status: DC

## 2021-03-25 NOTE — Discharge Instructions (Addendum)
Your symptoms are most likely related to a virus which will resolve on its own it is possible that is also has flared your asthma therefore I have ordered you an inhaler to use  Use albuterol 2 puffs every 4-6 hours as needed for increased shortness of breath  Starting tomorrow morning take prednisone every day with food for 5 days  May use Mucinex twice a day as needed  Can use Tessalon pill every 8 hours as needed for coughing  Can continue use of over-the-counter cough DM medication  COVID and flu test pending, we will notify you if positive  May follow-up with urgent care or with primary care doctor as needed

## 2021-03-25 NOTE — ED Triage Notes (Addendum)
Cough, wheezing, sob for 2 days, waking patient up at night.  Patient has taken robitussin, claritin, aleve.  Patient arrived back in town from a trip to [place] on Thursday (03/18/2021) patient traveled by train  Patient is concerned she may have used 2 tampons at the same time.  Patient states she has removed tampons.  Patient is spotting.  Patient has decided to call ob/gyn for this issue in morning
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
## 2021-03-26 LAB — SARS CORONAVIRUS 2 (TAT 6-24 HRS): SARS Coronavirus 2: NEGATIVE

## 2021-03-29 ENCOUNTER — Encounter: Payer: Self-pay | Admitting: Physician Assistant

## 2021-03-29 ENCOUNTER — Other Ambulatory Visit: Payer: Self-pay

## 2021-03-29 ENCOUNTER — Ambulatory Visit (INDEPENDENT_AMBULATORY_CARE_PROVIDER_SITE_OTHER): Payer: 59 | Admitting: Physician Assistant

## 2021-03-29 ENCOUNTER — Ambulatory Visit: Payer: 59

## 2021-03-29 DIAGNOSIS — F3160 Bipolar disorder, current episode mixed, unspecified: Secondary | ICD-10-CM

## 2021-03-29 DIAGNOSIS — F4310 Post-traumatic stress disorder, unspecified: Secondary | ICD-10-CM

## 2021-03-29 DIAGNOSIS — F411 Generalized anxiety disorder: Secondary | ICD-10-CM | POA: Diagnosis not present

## 2021-03-29 MED ORDER — HYDROXYZINE HCL 25 MG PO TABS: 25.0000 mg | ORAL_TABLET | Freq: Three times a day (TID) | ORAL | 0 refills | Status: DC | PRN
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
## 2021-03-29 NOTE — Progress Notes (Signed)
NURSE VISIT:  Pt arrived for her apt with Melony Overly, PA-C first, according to time on her schedule pt is late. She was still seen. She was due for her Abilify Maintena 400 mg injection as well, which is provided by office. Pt's last injection at the office was 01/22/2021 but pt has been in the hospital several times since then.  Pt received injection in left upper gluteal area. She tolerated well. Her boyfriend also came back with her. Pt talkative and engaging. She is not feeling well and taking Prednisone for her upper respiratory infection. She was vomiting in the bathroom prior to receiving her injection. Pt has lost weight, she reports 40# in 2 months, not planned but situational. Pt trying to eat and feel better. She reports she will be back in 28 days for next injection.   LOT [id number] EXP APR 2024

## 2021-04-05 ENCOUNTER — Other Ambulatory Visit: Payer: Self-pay

## 2021-04-05 ENCOUNTER — Ambulatory Visit (HOSPITAL_COMMUNITY)
Admission: EM | Admit: 2021-04-05 | Discharge: 2021-04-05 | Disposition: A | Discharge status: Home / Self Care | Payer: 59 | Attending: Student | Admitting: Student

## 2021-04-05 DIAGNOSIS — J029 Acute pharyngitis, unspecified: Secondary | ICD-10-CM | POA: Diagnosis present

## 2021-04-05 DIAGNOSIS — G629 Polyneuropathy, unspecified: Secondary | ICD-10-CM | POA: Diagnosis present

## 2021-04-05 LAB — POCT RAPID STREP A, ED / UC: Streptococcus, Group A Screen (Direct): NEGATIVE

## 2021-04-05 MED ORDER — LIDOCAINE VISCOUS HCL 2 % MT SOLN: 15.0000 mL | OROMUCOSAL | 0 refills | Status: DC | PRN

## 2021-04-05 NOTE — ED Triage Notes (Signed)
PT reports sores in mouth and leg pain.

## 2021-04-05 NOTE — ED Notes (Signed)
Pt left exam to stating was going to car to get purse. Pt not in room at time of discharge.

## 2021-04-05 NOTE — Discharge Instructions (Addendum)
-  For sore throat, use lidocaine mouthwash up to every 4 hours. Make sure not to eat for at least 1 hour after using this, as your mouth will be very numb and you could bite yourself. -Your strep test was negative,. You do not have strep throat -Follow-up with your orthopedist

## 2021-04-05 NOTE — ED Provider Notes (Signed)
[place]    CSN: [id number] Arrival date & time: 04/05/21  1724      History   Chief Complaint Chief Complaint  Patient presents with   sores in mouth    HPI Kelly Carr is a 50 y.o. adult presenting with sores throat and leg pain. Medical history bipolar, depression. -Describes sore throat for 3 days and sores on lips.  Pain with swallowing.  Handling secretions without difficulty.  Denies fever/chills.  Requesting strep test.  Has had multiple negative home COVID tests, and denies known sick exposure. -Chronic neuropathy, with current exacerbation.  Feels like pins-and-needles in bilateral feet, describes this is very painful.  States that she does not tolerate gabapentin.  She is followed by Ortho, but has not scheduled a follow-up with them for this.  HPI  Past Medical History:  Diagnosis Date   Abnormal Pap smear    Unknown results>colpo>normal   Anxiety    Arthritis    Asthma    Bipolar 1 disorder ([place])    Depression    PTSD (post-traumatic stress disorder)     Patient Active Problem List   Diagnosis Date Noted   Bipolar I disorder with depression ([place]) 02/05/2021   Bipolar 1 disorder, mixed ([place]) 02/05/2021   Cellulitis 07/22/2020   Seasonal allergies 07/22/2020   Acetaminophen overdose, intentional self-harm, initial encounter ([place]) 09/23/2019   Cannabis abuse 11/01/2018   Severe manic bipolar 1 disorder with psychotic behavior ([place]) 10/31/2018   Orbital floor (blow-out) closed fracture ([place]) 10/17/2018   PTSD (post-traumatic stress disorder) 03/21/2018   Major depressive disorder, 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/2017   Manic behavior ([place])    Piriformis syndrome of right side 02/23/2016   HLD (hyperlipidemia) 11/12/2015   Normocytic anemia 05/26/2015   Left upper quadrant abdominal pain 05/04/2015   Low grade squamous intraepithelial lesion (LGSIL) on cervical Pap smear 03/04/2015   Labral tear of hip joint  12/27/2014   Irregular menses 07/17/2013   GERD (gastroesophageal reflux disease) 11/10/2010   Carpal tunnel syndrome of left wrist 11/10/2010   Severe bipolar I disorder, current or most recent episode mixed ([place]) 11/25/2008   DEPRESSION 10/21/2008    Past Surgical History:  Procedure Laterality Date   NO PAST SURGERIES      OB History     Gravida  4   Para  2   Term  2   Preterm      AB  2   Living  2      SAB  2   IAB      Ectopic      Multiple      Live Births  2            Home Medications    Prior to Admission medications   Medication Sig Start Date End Date Taking? Authorizing Provider  lidocaine (XYLOCAINE) 2 % solution Use as directed 15 mLs in the mouth or throat as needed for mouth pain. 04/05/21  Yes Hazel Sams, PA-C  acetaminophen (TYLENOL 8 HOUR) 650 MG CR tablet Take 1 tablet (650 mg total) by mouth every 8 (eight) hours as needed for pain. 10/23/20   Mullis, Kiersten P, DO  albuterol (VENTOLIN HFA) 108 (90 Base) MCG/ACT inhaler Inhale 2 puffs into the lungs every 4 (four) hours as needed for wheezing or shortness of breath. 03/25/21   White, Leitha Schuller, NP  ARIPiprazole ER (ABILIFY MAINTENA) 400 MG SRER injection Inject 2 mLs (400 mg total)  into the muscle every 28 (twenty-eight) days. 06/15/20   Salley Scarlet, MD  benzonatate (TESSALON) 100 MG capsule Take 1 capsule (100 mg total) by mouth every 8 (eight) hours. 03/25/21   White, Leitha Schuller, NP  guaiFENesin (MUCINEX) 600 MG 12 hr tablet Take 1 tablet (600 mg total) by mouth 2 (two) times daily. 03/25/21   Hans Eden, NP  hydrOXYzine (ATARAX/VISTARIL) 25 MG tablet Take 1 tablet (25 mg total) by mouth 3 (three) times daily as needed for anxiety. 03/29/21   [name] T, PA-C  hydrOXYzine (ATARAX/VISTARIL) 25 MG tablet Take 1 tablet (25 mg total) by mouth 3 (three) times daily as needed for anxiety. 03/29/21   Addison Lank, PA-C  Oxcarbazepine (TRILEPTAL) 300 MG tablet Take 1  tablet (300 mg total) by mouth 2 (two) times daily. Patient not taking: Reported on 03/25/2021 02/10/21 03/12/21  Ival Bible, MD  prazosin (MINIPRESS) 1 MG capsule Take 1 capsule (1 mg total) by mouth at bedtime. Patient not taking: Reported on 03/25/2021 02/10/21 03/12/21  Ival Bible, MD  predniSONE (DELTASONE) 20 MG tablet Take 2 tablets (40 mg total) by mouth daily. 03/25/21   Hans Eden, NP    Family History Family History  Problem Relation Age of Onset   Diabetes Mother    Hypertension Mother    Heart disease Mother 40   Schizophrenia Mother    Diabetes Maternal Grandmother    Heart disease Maternal Grandmother    Diabetes Maternal Grandfather    Heart disease Maternal Grandfather    Depression Daughter    Bipolar disorder Cousin    Bipolar disorder Nephew     Social History Social History   Tobacco Use   Smoking status: Some Days    Packs/day: 0.10    Types: Cigarettes   Smokeless tobacco: Never  Vaping Use   Vaping Use: Every day   Substances: Nicotine, Flavoring  Substance Use Topics   Alcohol use: Not Currently    Comment: 1 per month   Drug use: Yes    Types: Marijuana    Comment: Patiaent states that she eats THC gummies     Allergies   Doxycycline, Gabapentin, Haldol [haloperidol lactate], Risperidone and related, Tramadol, and Valproic acid   Review of Systems Review of Systems  Constitutional:  Negative for appetite change, chills and fever.  HENT:  Positive for sore throat. Negative for congestion, ear pain, rhinorrhea, sinus pressure and sinus pain.   Eyes:  Negative for redness and visual disturbance.  Respiratory:  Negative for cough, chest tightness, shortness of breath and wheezing.   Cardiovascular:  Negative for chest pain and palpitations.  Gastrointestinal:  Negative for abdominal pain, constipation, diarrhea, nausea and vomiting.  Genitourinary:  Negative for dysuria, frequency and urgency.  Musculoskeletal:   Negative for myalgias.  Neurological:  Negative for dizziness, weakness and headaches.  Psychiatric/Behavioral:  Negative for confusion.   All other systems reviewed and are negative.   Physical Exam Triage Vital Signs ED Triage Vitals  Enc Vitals Group     BP 04/05/21 1840 130/82     Pulse Rate 04/05/21 1840 90     Resp 04/05/21 1840 18     Temp 04/05/21 1840 98.3 F (36.8 C)     Temp src --      SpO2 04/05/21 1840 97 %     Weight --      Height --      Head Circumference --  Peak Flow --      Pain Score 04/05/21 1836 10     Pain Loc --      Pain Edu? --      Excl. in GC? --    No data found.  Updated Vital Signs BP 130/82   Pulse 90   Temp 98.3 F (36.8 C)   Resp 18   LMP 03/21/2021   SpO2 97%   Visual Acuity Right Eye Distance:   Left Eye Distance:   Bilateral Distance:    Right Eye Near:   Left Eye Near:    Bilateral Near:     Physical Exam Vitals reviewed.  Constitutional:      General: She is not in acute distress.    Appearance: Normal appearance. She is not ill-appearing.  HENT:     Head: Normocephalic and atraumatic.     Right Ear: Tympanic membrane, ear canal and external ear normal. No tenderness. No middle ear effusion. There is no impacted cerumen. Tympanic membrane is not perforated, erythematous, retracted or bulging.     Left Ear: Tympanic membrane, ear canal and external ear normal. No tenderness.  No middle ear effusion. There is no impacted cerumen. Tympanic membrane is not perforated, erythematous, retracted or bulging.     Nose: Nose normal. No congestion.     Mouth/Throat:     Mouth: Mucous membranes are moist.     Pharynx: Uvula midline. Posterior oropharyngeal erythema present. No oropharyngeal exudate.     Tonsils: 1+ on the right. 1+ on the left.     Comments: Smooth erythema posterior pharynx On exam, uvula is midline, she is tolerating her secretions without difficulty, there is no trismus, no drooling, she has normal  phonation Few canker sores inner lips Eyes:     Extraocular Movements: Extraocular movements intact.     Pupils: Pupils are equal, round, and reactive to light.  Cardiovascular:     Rate and Rhythm: Normal rate and regular rhythm.     Heart sounds: Normal heart sounds.  Pulmonary:     Effort: Pulmonary effort is normal.     Breath sounds: Normal breath sounds. No decreased breath sounds, wheezing, rhonchi or rales.  Abdominal:     Palpations: Abdomen is soft.     Tenderness: There is no abdominal tenderness. There is no guarding or rebound.  Musculoskeletal:     Comments: Calves are equal and symmetric, without skin changes or swelling. DP 2+, cap refill <2 seconds.   Lymphadenopathy:     Cervical: No cervical adenopathy.     Right cervical: No superficial cervical adenopathy.    Left cervical: No superficial cervical adenopathy.  Skin:    Comments: No lesion on hands, feet  Neurological:     General: No focal deficit present.     Mental Status: She is alert and oriented to person, place, and time.  Psychiatric:        Mood and Affect: Mood normal. Affect is tearful.        Speech: Speech is rapid and pressured and tangential.        Behavior: Behavior normal.        Thought Content: Thought content normal.        Judgment: Judgment normal.     UC Treatments / Results  Labs (all labs ordered are listed, but only abnormal results are displayed) Labs Reviewed  CULTURE, GROUP A STREP [facility])  POCT RAPID STREP A, ED / UC    EKG  Radiology No results found.  Procedures Procedures (including critical care time)  Medications Ordered in UC Medications - No data to display  Initial Impression / Assessment and Plan / UC Course  I have reviewed the triage vital signs and the nursing notes.  Pertinent labs & imaging results that were available during my care of the patient were reviewed by me and considered in my medical decision making (see chart for details).      This patient is a very pleasant 50 y.o. year old female presenting with chronic neuropathy and viral pharyngitis/ canker sores. Today this pt is afebrile nontachycardic nontachypneic, oxygenating well on room air, no wheezes rhonchi or rales.   Rapid strep negative, culture sent.  We will manage symptomatically with viscous lidocaine.  For neuropathy, she is allergic to gabapentin so we will defer this to her orthopedist Dr. Raeford Razor.  ED return precautions discussed. Patient verbalizes understanding and agreement.    Final Clinical Impressions(s) / UC Diagnoses   Final diagnoses:  Neuropathy  Viral pharyngitis     Discharge Instructions      -For sore throat, use lidocaine mouthwash up to every 4 hours. Make sure not to eat for at least 1 hour after using this, as your mouth will be very numb and you could bite yourself. -Your strep test was negative,. You do not have strep throat -Follow-up with your orthopedist     ED Prescriptions     Medication Sig Dispense Auth. Provider   lidocaine (XYLOCAINE) 2 % solution Use as directed 15 mLs in the mouth or throat as needed for mouth pain. 100 mL Hazel Sams, PA-C      PDMP not reviewed this encounter.   Hazel Sams, PA-C 04/06/21 [id number]

## 2021-04-06 ENCOUNTER — Ambulatory Visit (INDEPENDENT_AMBULATORY_CARE_PROVIDER_SITE_OTHER): Payer: 59 | Admitting: Family Medicine

## 2021-04-06 ENCOUNTER — Encounter: Payer: Self-pay | Admitting: Family Medicine

## 2021-04-06 ENCOUNTER — Ambulatory Visit (INDEPENDENT_AMBULATORY_CARE_PROVIDER_SITE_OTHER): Payer: 59 | Admitting: Clinical

## 2021-04-06 VITALS — BP 120/72 | Ht 64.0 in | Wt 150.0 lb

## 2021-04-06 DIAGNOSIS — R202 Paresthesia of skin: Secondary | ICD-10-CM | POA: Diagnosis not present

## 2021-04-06 DIAGNOSIS — F319 Bipolar disorder, unspecified: Secondary | ICD-10-CM | POA: Diagnosis not present

## 2021-04-06 DIAGNOSIS — M5412 Radiculopathy, cervical region: Secondary | ICD-10-CM | POA: Diagnosis not present

## 2021-04-06 DIAGNOSIS — M79609 Pain in unspecified limb: Secondary | ICD-10-CM

## 2021-04-06 NOTE — Patient Instructions (Signed)
Good to see you Please try physical therapy  We'll get the nerve studies done by neurology   Please send me a message in MyChart with any questions or updates.  Please see me back in 4 weeks.   --Dr. Jordan Likes
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
## 2021-04-07 DIAGNOSIS — R202 Paresthesia of skin: Secondary | ICD-10-CM | POA: Insufficient documentation

## 2021-04-07 DIAGNOSIS — M5412 Radiculopathy, cervical region: Secondary | ICD-10-CM | POA: Insufficient documentation

## 2021-04-07 DIAGNOSIS — M79609 Pain in unspecified limb: Secondary | ICD-10-CM

## 2021-04-07 HISTORY — DX: Paresthesia of skin: M79.609

## 2021-04-07 NOTE — Assessment & Plan Note (Signed)
Symptoms in her neck and periscapular region are more consistent with an irritation of the nerve. -Counseled on home exercise therapy and supportive care. -Referral to physical therapy. -Could consider further imaging.

## 2021-04-07 NOTE — Assessment & Plan Note (Signed)
Has pain and altered sensation in her lower extremities.  Inconsistent with a specific pattern.  Unclear if this is related to her trauma which may be more of a complex regional pain syndrome versus neuropathy. -Counseled on home exercise therapy and supportive care. -Referral to physical therapy. -Referral to neurology for nerve study.

## 2021-04-08 LAB — CULTURE, GROUP A STREP (THRC)
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
## 2021-04-16 ENCOUNTER — Telehealth (HOSPITAL_COMMUNITY): Payer: Self-pay | Admitting: Professional

## 2021-04-19 ENCOUNTER — Ambulatory Visit (HOSPITAL_COMMUNITY)
Admission: EM | Admit: 2021-04-19 | Discharge: 2021-04-19 | Disposition: A | Discharge status: Home / Self Care | Payer: 59 | Attending: Psychiatry | Admitting: Psychiatry

## 2021-04-19 DIAGNOSIS — F411 Generalized anxiety disorder: Secondary | ICD-10-CM | POA: Diagnosis not present

## 2021-04-19 MED ORDER — PRAZOSIN HCL 1 MG PO CAPS
1.0000 mg | ORAL_CAPSULE | Freq: Every day | ORAL | Status: DC
  Filled 2021-04-19: qty 1

## 2021-04-19 MED ORDER — PRAZOSIN HCL 1 MG PO CAPS: 1.0000 mg | ORAL_CAPSULE | Freq: Every day | ORAL | 0 refills | Status: DC

## 2021-04-19 MED ORDER — HYDROXYZINE HCL 25 MG PO TABS: 25.0000 mg | ORAL_TABLET | Freq: Three times a day (TID) | ORAL | 0 refills | Status: DC | PRN

## 2021-04-19 MED ORDER — HYDROXYZINE HCL 25 MG PO TABS
25.0000 mg | ORAL_TABLET | Freq: Three times a day (TID) | ORAL | Status: DC | PRN
  Filled 2021-04-19: qty 20
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
## 2021-04-19 NOTE — Discharge Instructions (Addendum)
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
## 2021-04-19 NOTE — ED Notes (Signed)
Patient was discharged to home. Patient was given discharge instructions and resources.

## 2021-04-22 ENCOUNTER — Telehealth (HOSPITAL_COMMUNITY): Payer: Self-pay

## 2021-04-22 ENCOUNTER — Ambulatory Visit (HOSPITAL_COMMUNITY): Payer: 59 | Admitting: Clinical

## 2021-04-22 NOTE — BH Assessment (Signed)
Care Management - Follow Up Discharges   Writer attempted to make contact with patient today and was unsuccessful.  Phone rang busy.   Per chart review, pt provided with outpatient resources.

## 2021-04-27 ENCOUNTER — Ambulatory Visit: Payer: 59 | Admitting: Physician Assistant

## 2021-05-03 ENCOUNTER — Ambulatory Visit (INDEPENDENT_AMBULATORY_CARE_PROVIDER_SITE_OTHER): Payer: 59 | Admitting: Physician Assistant

## 2021-05-03 ENCOUNTER — Encounter: Payer: Self-pay | Admitting: Physician Assistant

## 2021-05-03 ENCOUNTER — Other Ambulatory Visit: Payer: Self-pay

## 2021-05-03 ENCOUNTER — Ambulatory Visit: Payer: 59

## 2021-05-03 DIAGNOSIS — F3160 Bipolar disorder, current episode mixed, unspecified: Secondary | ICD-10-CM | POA: Diagnosis not present

## 2021-05-03 DIAGNOSIS — F411 Generalized anxiety disorder: Secondary | ICD-10-CM

## 2021-05-03 DIAGNOSIS — F319 Bipolar disorder, unspecified: Secondary | ICD-10-CM

## 2021-05-03 DIAGNOSIS — F515 Nightmare disorder: Secondary | ICD-10-CM | POA: Diagnosis not present

## 2021-05-03 DIAGNOSIS — G4700 Insomnia, unspecified: Secondary | ICD-10-CM | POA: Diagnosis not present

## 2021-05-03 MED ORDER — QUETIAPINE FUMARATE ER 150 MG PO TB24: 150.0000 mg | ORAL_TABLET | Freq: Every day | ORAL | 1 refills | Status: DC

## 2021-05-03 MED ORDER — PRAZOSIN HCL 1 MG PO CAPS: 1.0000 mg | ORAL_CAPSULE | Freq: Every day | ORAL | 1 refills | Status: DC
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
## 2021-05-04 ENCOUNTER — Ambulatory Visit (HOSPITAL_BASED_OUTPATIENT_CLINIC_OR_DEPARTMENT_OTHER)
Admission: RE | Admit: 2021-05-04 | Discharge: 2021-05-04 | Disposition: A | Discharge status: Home / Self Care | Payer: 59 | Source: Ambulatory Visit | Attending: Family Medicine | Admitting: Family Medicine

## 2021-05-04 ENCOUNTER — Ambulatory Visit (INDEPENDENT_AMBULATORY_CARE_PROVIDER_SITE_OTHER): Payer: 59 | Admitting: Family Medicine

## 2021-05-04 ENCOUNTER — Encounter: Payer: Self-pay | Admitting: Family Medicine

## 2021-05-04 VITALS — BP 120/84 | Ht 64.0 in | Wt 150.0 lb

## 2021-05-04 DIAGNOSIS — S39012A Strain of muscle, fascia and tendon of lower back, initial encounter: Secondary | ICD-10-CM | POA: Insufficient documentation

## 2021-05-04 DIAGNOSIS — S56912A Strain of unspecified muscles, fascia and tendons at forearm level, left arm, initial encounter: Secondary | ICD-10-CM | POA: Diagnosis not present

## 2021-05-04 DIAGNOSIS — S161XXA Strain of muscle, fascia and tendon at neck level, initial encounter: Secondary | ICD-10-CM | POA: Diagnosis present

## 2021-05-04 DIAGNOSIS — M7581 Other shoulder lesions, right shoulder: Secondary | ICD-10-CM | POA: Insufficient documentation

## 2021-05-04 HISTORY — DX: Strain of unspecified muscles, fascia and tendons at forearm level, left arm, initial encounter: S56.912A

## 2021-05-04 HISTORY — DX: Other shoulder lesions, right shoulder: M75.81

## 2021-05-04 HISTORY — DX: Strain of muscle, fascia and tendon at neck level, initial encounter: S16.1XXA

## 2021-05-04 MED ORDER — DICLOFENAC SODIUM 1 % EX GEL: 4.0000 g | Freq: Four times a day (QID) | CUTANEOUS | 11 refills | Status: DC

## 2021-05-04 NOTE — Assessment & Plan Note (Signed)
Has normal range of motion and strength today. -Counseled on home exercise therapy and supportive care. -X-ray.
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
## 2021-05-05 ENCOUNTER — Ambulatory Visit (HOSPITAL_COMMUNITY): Payer: 59 | Admitting: Licensed Clinical Social Worker

## 2021-05-05 NOTE — Progress Notes (Signed)
Pt here for her office visit with Melony Overly, PA-C for medication follow up and she also received her monthly injection of Abilify Maintena 400 mg IM. Injection administered into her left upper gluteal area, she tolerated well. She reports this is the only injectable she can take and tolerate and it works the best. Her last injection was 03/29/2021. Pt reports she has a lot of stressors in her life, fighting with boyfriend, starting a new job, going back to school. She reports losing a lot of weight, at least 50 pounds. She reports having nausea and vomiting. Unclear what is causing this to happen.  Advised pt to schedule next injection for 28 days/4 weeks.   LOT [id number] EXP 2024 APR

## 2021-05-06 ENCOUNTER — Ambulatory Visit (HOSPITAL_COMMUNITY): Payer: 59 | Admitting: Clinical

## 2021-05-06 ENCOUNTER — Telehealth (HOSPITAL_COMMUNITY): Payer: Self-pay | Admitting: Clinical

## 2021-05-06 NOTE — Telephone Encounter (Signed)
Patient called to cancel today's appt for urgent family matters. Pt request to let her therapist know, as soon as she has a new phone and a new way of contact she will reach out to reschedule.

## 2021-05-07 ENCOUNTER — Telehealth: Payer: Self-pay | Admitting: Family Medicine

## 2021-05-07 NOTE — Telephone Encounter (Signed)
Unable to leave VM for patient. If she calls back please have her speak with a nurse/CMA and inform that her cervical and lumbar x-ray show mild degenerative changes and no changes appreciated of the shoulder.   If any questions then please take the best time and phone number to call and I will try to call her back.   Myra Rude, MD [facility] 05/07/2021, 12:20 PM

## 2021-05-12 ENCOUNTER — Ambulatory Visit (HOSPITAL_COMMUNITY): Payer: Self-pay | Admitting: Licensed Clinical Social Worker

## 2021-05-20 ENCOUNTER — Ambulatory Visit
Admission: EM | Admit: 2021-05-20 | Discharge: 2021-05-20 | Disposition: A | Discharge status: Home / Self Care | Payer: 59 | Attending: Family Medicine | Admitting: Family Medicine

## 2021-05-20 ENCOUNTER — Encounter: Payer: Self-pay | Admitting: Emergency Medicine

## 2021-05-20 DIAGNOSIS — Z20828 Contact with and (suspected) exposure to other viral communicable diseases: Secondary | ICD-10-CM

## 2021-05-20 DIAGNOSIS — J029 Acute pharyngitis, unspecified: Secondary | ICD-10-CM

## 2021-05-20 DIAGNOSIS — J45901 Unspecified asthma with (acute) exacerbation: Secondary | ICD-10-CM | POA: Diagnosis not present

## 2021-05-20 LAB — POCT INFLUENZA A/B
Influenza A, POC: NEGATIVE
Influenza B, POC: NEGATIVE

## 2021-05-20 MED ORDER — ALBUTEROL SULFATE HFA 108 (90 BASE) MCG/ACT IN AERS
2.0000 | INHALATION_SPRAY | Freq: Four times a day (QID) | RESPIRATORY_TRACT | Status: DC | PRN
  Administered 2021-05-20: 2 via RESPIRATORY_TRACT

## 2021-05-20 MED ORDER — OSELTAMIVIR PHOSPHATE 75 MG PO CAPS: 75.0000 mg | ORAL_CAPSULE | Freq: Every day | ORAL | 0 refills | Status: AC

## 2021-05-20 MED ORDER — PREDNISONE 10 MG PO TABS: 10.0000 mg | ORAL_TABLET | Freq: Every day | ORAL | 0 refills | Status: AC

## 2021-05-20 MED ORDER — PROMETHAZINE-DM 6.25-15 MG/5ML PO SYRP: 5.0000 mL | ORAL_SOLUTION | Freq: Three times a day (TID) | ORAL | 0 refills | Status: DC | PRN

## 2021-05-20 NOTE — ED Triage Notes (Signed)
Pt c/o cough, runny nose, wheezing,ST x 4 days
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
## 2021-05-20 NOTE — Discharge Instructions (Addendum)
Treating you for flu exposures with Tamiflu 75 mg daily for total of 10 days.  For your acute asthma exacerbation and acute viral pharyngitis start prednisone 10 mg daily for total 5 days.  For cough and congestion promethazine DM 5 mL up to 3 times a day as needed. Force plenty of fluids.  Alternate Tylenol and ibuprofen for any body aches or fever that you may develop.  For any severe difficulty breathing that does not readily improve with use of your albuterol inhaler or chest pressure or pain go immediately to the emergency department.

## 2021-06-03 ENCOUNTER — Ambulatory Visit (INDEPENDENT_AMBULATORY_CARE_PROVIDER_SITE_OTHER): Payer: 59

## 2021-06-03 ENCOUNTER — Other Ambulatory Visit: Payer: Self-pay

## 2021-06-03 DIAGNOSIS — F314 Bipolar disorder, current episode depressed, severe, without psychotic features: Secondary | ICD-10-CM | POA: Diagnosis not present

## 2021-06-03 NOTE — Progress Notes (Signed)
Nurse Note:   Pt arrived for her monthly injection of Abilify Maintena 400 mg IM. Last injection was on 05/03/21 after her office visit. Injection administered into her left upper gluteal area, she tolerated well. Asked if she needed refills and she reports nothing is needed at this time. Pt preasents very well, wearing mathing shirts with her boyfriend who I asked to wait in the lobby do to receiving the injection in Nurses Office.Marland Kitchen      LOT [id number] EXP 2024 FEB

## 2021-06-16 ENCOUNTER — Encounter: Payer: Self-pay | Admitting: Neurology

## 2021-06-16 ENCOUNTER — Telehealth: Payer: Self-pay | Admitting: Neurology

## 2021-06-16 ENCOUNTER — Ambulatory Visit (INDEPENDENT_AMBULATORY_CARE_PROVIDER_SITE_OTHER): Payer: Self-pay | Admitting: Neurology

## 2021-06-16 DIAGNOSIS — Z91199 Patient's noncompliance with other medical treatment and regimen due to unspecified reason: Secondary | ICD-10-CM

## 2021-06-16 NOTE — Telephone Encounter (Signed)
Kelly Carr no showed her new patient appointment today.  She has an 18% no-show rate in epic.  This is her first no-show at least here at [facility], neuro.  If patient calls back please have a talk with her, we have a lot of patients awaiting appointments and its really not fair to them when people just do not show up, please ask her not to make an appointment unless she intends on coming.  Something could have happened today and in that case we totally understand and we are happy to see her back but if she no-shows again she will be dismissed from our practice.

## 2021-06-16 NOTE — Progress Notes (Signed)
Kelly Carr no showed her new patient appointment today.  She has an 18% no-show rate in epic.  This is her first no-show at least here.  If patient calls back please have a talk with her, we have a lot of patients awaiting appointments and its really not fair to them when people just do not show up, please ask her not to make an appointment unless she intends on coming.  Something could have happened today we totally understand that we are happy to see her back but if she no-shows again she will be dismissed from our practice.

## 2021-06-17 ENCOUNTER — Ambulatory Visit: Payer: 59 | Admitting: Physician Assistant

## 2021-06-17 ENCOUNTER — Other Ambulatory Visit: Payer: Self-pay

## 2021-06-17 ENCOUNTER — Ambulatory Visit: Payer: 59

## 2021-07-07 ENCOUNTER — Ambulatory Visit (INDEPENDENT_AMBULATORY_CARE_PROVIDER_SITE_OTHER): Payer: No Payment, Other | Admitting: Clinical

## 2021-07-07 ENCOUNTER — Other Ambulatory Visit: Payer: Self-pay

## 2021-07-07 DIAGNOSIS — F314 Bipolar disorder, current episode depressed, severe, without psychotic features: Secondary | ICD-10-CM | POA: Diagnosis not present
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
## 2021-07-07 NOTE — Plan of Care (Signed)
Client was in agreement to the plan. ?

## 2021-07-08 ENCOUNTER — Ambulatory Visit (INDEPENDENT_AMBULATORY_CARE_PROVIDER_SITE_OTHER): Payer: Self-pay

## 2021-07-08 DIAGNOSIS — F314 Bipolar disorder, current episode depressed, severe, without psychotic features: Secondary | ICD-10-CM

## 2021-07-08 NOTE — Progress Notes (Signed)
Nurse Note:     Pt arrived for her monthly injection of Abilify Maintena 400 mg IM. Last injection was on 06/03/2021. Pt does not have any insurance now, she had [employer] and didn't realize it was not continuing iin 2023.  Injection administered into her left upper gluteal area, she tolerated well. When asked how she was doing, she reports feeling depressed. She reports 2022 was not a good year for her and she has so many circumstantial issues. She did go to a counselor yesterday but didn't feel that person was a good match for her. Instructed her to schedule apt with Helene Kelp as soon as she could. She is aware she will have to pay out of pocket. She also mentioned she is not taking Seroquel anymore, says she needs a refill but also states it was so sedating. Advised her once her body adjusts to the medication it won't be so sedating.  Pt reports she will call back to set up an appointment with Helene Kelp for follow up.     LOT [id number] EXP 2024 AUG

## 2021-08-09 ENCOUNTER — Telehealth: Payer: Self-pay | Admitting: Physician Assistant

## 2021-08-09 NOTE — Telephone Encounter (Signed)
Please review

## 2021-08-09 NOTE — Telephone Encounter (Signed)
Next visit is 08/24/21. Her last injection was on 07/08/21. Railey asks if the shots and visits have to be together as an appointment or can she do the shot on another day? Her phone number is [phone]. ?

## 2021-08-10 NOTE — Telephone Encounter (Signed)
I am sending this to you Traci, since he will be given the injection.  Please schedule her for that as soon as possible, no she does not have to have the appointment with me on the same day.  Thank you.

## 2021-08-11 NOTE — Telephone Encounter (Signed)
Please get her scheduled for injection. She is due anytime now. Just check with me on any questions.  ?

## 2021-08-13 ENCOUNTER — Other Ambulatory Visit: Payer: Self-pay

## 2021-08-13 ENCOUNTER — Ambulatory Visit (INDEPENDENT_AMBULATORY_CARE_PROVIDER_SITE_OTHER): Payer: Self-pay

## 2021-08-13 DIAGNOSIS — F314 Bipolar disorder, current episode depressed, severe, without psychotic features: Secondary | ICD-10-CM

## 2021-08-13 NOTE — Progress Notes (Signed)
Nurse Note: ?  ?  ?Pt arrived for her monthly injection of Abilify Maintena 400 mg IM. Last injection was on 07/08/2021. Injection administered into her left upper gluteal area, she tolerated well. She has apt the end of this month with Helene Kelp. No complaints voiced. ?  ?  ?LOT [id number] ?EXP 2024 AUG ?

## 2021-08-24 ENCOUNTER — Ambulatory Visit: Payer: Self-pay | Admitting: Physician Assistant

## 2021-09-02 ENCOUNTER — Ambulatory Visit (HOSPITAL_COMMUNITY): Payer: No Payment, Other | Admitting: Clinical

## 2021-09-08 ENCOUNTER — Ambulatory Visit: Payer: Self-pay | Admitting: Physician Assistant

## 2021-09-14 ENCOUNTER — Ambulatory Visit (INDEPENDENT_AMBULATORY_CARE_PROVIDER_SITE_OTHER): Payer: Self-pay

## 2021-09-14 DIAGNOSIS — F3163 Bipolar disorder, current episode mixed, severe, without psychotic features: Secondary | ICD-10-CM

## 2021-09-14 NOTE — Progress Notes (Signed)
Nurse Visit:  Pt here for her monthly Abilify Maintena 400 mg IM. Last injection was 08/13/2021. No complaints reported. She has apt with Helene Kelp end of this month, her apt last month was canceled due to provider being out sick. Injection administered in her left upper gluteal area, she tolerated well.    LOT [id number] EXP AUG 2024

## 2021-09-23 ENCOUNTER — Ambulatory Visit (HOSPITAL_COMMUNITY): Payer: No Payment, Other | Admitting: Clinical

## 2021-10-01 ENCOUNTER — Telehealth (INDEPENDENT_AMBULATORY_CARE_PROVIDER_SITE_OTHER): Payer: Self-pay | Admitting: Physician Assistant

## 2021-10-01 ENCOUNTER — Encounter: Payer: Self-pay | Admitting: Physician Assistant

## 2021-10-01 DIAGNOSIS — F3160 Bipolar disorder, current episode mixed, unspecified: Secondary | ICD-10-CM

## 2021-10-01 NOTE — Progress Notes (Signed)
[facility] ? ?Patient ID: Kelly Carr,  ?MRN: [id number] ? ?PCP: Pcp, No ? ?Date of Evaluation: 10/01/2021 ?Time spent:30 minutes ? ?Chief Complaint:  ?Chief Complaint   ?Follow-up ?  ? ?Virtual Visit via Telehealth ? ?I connected with patient by a video enabled telemedicine application with their informed consent, and verified patient privacy and that I am speaking with the correct person using two identifiers.  I am private, in my office and the patient is at home. ? ?I discussed the limitations, risks, security and privacy concerns of performing an evaluation and management service by video and the availability of in person appointments. I also discussed with the patient that there may be a patient responsible charge related to this service. The patient expressed understanding and agreed to proceed.  We started out with a video but I was unable to hear her so we changed to telephone. ?  ?I discussed the assessment and treatment plan with the patient. The patient was provided an opportunity to ask questions and all were answered. The patient agreed with the plan and demonstrated an understanding of the instructions. ?  ?The patient was advised to call back or seek an in-person evaluation if the symptoms worsen or if the condition fails to improve as anticipated. ? ?I provided  30 minutes of non-face-to-face time during this encounter. ? ?HISTORY/CURRENT STATUS: ?HPI overdue for med check. ? ?4 months overdue for appt, she had to reschedule a couple of times and then I did once.  That was to recheck after starting prazosin and Seroquel for PTSD and nightmares.  She took those for a few weeks but the Seroquel caused extreme fatigue and grogginess so she stopped it.  She is not having any flashbacks and she is sleeping well, will have a nightmare a few times a week but they are tolerable.  Does not feel like she needs to be on any medications specifically for that.  In the past she took Seroquel 25 mg  and was able to tolerate that. ? ?Renai has been diligent about Abilify Maintena every 28 to 30 days.  States it has kept her stable for a long time now. ? ?She has been doing really well mentally.  Is able to enjoy things.  Denies decreased energy or motivation.  She is not working right now because of her mental health and awaiting a court case and unable to work until that is resolved.  She really wants to get back in the workforce, to have something to do as well as interact with other people.  Appetite has not changed.  No change in weight.  No extreme sadness, tearfulness, or feelings of hopelessness.  ADLs and personal hygiene are normal.  Does not cry easily.  Denies any changes in concentration, making decisions or remembering things.  Denies suicidal or homicidal thoughts. ? ?Patient denies increased energy with decreased need for sleep, no increased talkativeness, no racing thoughts, no impulsivity or risky behaviors, no increased spending, no increased libido, no grandiosity, no increased irritability or anger, no paranoia.  And no hallucinations. ? ?Denies dizziness, syncope, seizures, numbness, tingling, tremor, tics, unsteady gait, slurred speech, confusion. Denies muscle or joint pain, stiffness, or dystonia. Denies unexplained weight loss, frequent infections, or sores that heal slowly.  No polyphagia, polydipsia, or polyuria. Denies visual changes or paresthesias.  ? ?Individual Medical History/ Review of Systems: Changes? :No     ? ?Past medications for mental health diagnoses include: ?Gabapentin caused lethargy, Ativan, BuSpar, Wellbutrin,  Latuda, trazodone, Cogentin, Geodon, Trileptal, Vraylar, prazosin, Vistaril, Abilify maintainena, Abilify, Risperdal made her a zombie, Zyprexa made her a zombie, Lithium, Seroquel for PTSD but at 150 mg made her groggy (tolerated 25 mg long ago) ? ?Allergies: Doxycycline, Gabapentin, Haldol [haloperidol lactate], Risperidone and related, Tramadol, and  Valproic acid ? ?Current Medications:  ?Current Outpatient Medications:  ?  acetaminophen (TYLENOL 8 HOUR) 650 MG CR tablet, Take 1 tablet (650 mg total) by mouth every 8 (eight) hours as needed for pain., Disp: 60 tablet, Rfl: 0 ?  albuterol (VENTOLIN HFA) 108 (90 Base) MCG/ACT inhaler, Inhale 2 puffs into the lungs every 4 (four) hours as needed for wheezing or shortness of breath., Disp: 18 g, Rfl: 0 ?  ARIPiprazole ER (ABILIFY MAINTENA) 400 MG SRER injection, Inject 2 mLs (400 mg total) into the muscle every 28 (twenty-eight) days., Disp: 1 each, Rfl: 3 ?  benzonatate (TESSALON) 100 MG capsule, Take 1 capsule (100 mg total) by mouth every 8 (eight) hours. (Patient not taking: Reported on 05/03/2021), Disp: 21 capsule, Rfl: 0 ?  diclofenac Sodium (VOLTAREN) 1 % GEL, Apply 4 g topically 4 (four) times daily. To affected joint. (Patient not taking: Reported on 10/01/2021), Disp: 100 g, Rfl: 11 ?  hydrOXYzine (ATARAX/VISTARIL) 25 MG tablet, Take 1 tablet (25 mg total) by mouth 3 (three) times daily as needed for anxiety. (Patient not taking: Reported on 10/01/2021), Disp: 20 tablet, Rfl: 0 ?  lidocaine (XYLOCAINE) 2 % solution, Use as directed 15 mLs in the mouth or throat as needed for mouth pain. (Patient not taking: Reported on 05/03/2021), Disp: 100 mL, Rfl: 0 ?  promethazine-dextromethorphan (PROMETHAZINE-DM) 6.25-15 MG/5ML syrup, Take 5 mLs by mouth 3 (three) times daily as needed for cough. (Patient not taking: Reported on 10/01/2021), Disp: 180 mL, Rfl: 0 ?Medication Side Effects: none ? ?Family Medical/ Social History: Changes? Court case May 23rd. From 'awhile back.' Not working. Really wants to get back to work. ? ?MENTAL HEALTH EXAM: ? ?There were no vitals taken for this visit.There is no height or weight on file to calculate BMI.  ?General Appearance: Casual, Neat and Well Groomed  ?Eye Contact:  Good  ?Speech:  Clear and Coherent and Normal Rate  ?Volume:  Normal  ?Mood:  Euthymic  ?Affect:  Congruent   ?Thought Process:  Goal Directed and Descriptions of Associations: Circumstantial  ?Orientation:  Full (Time, Place, and Person)  ?Thought Content: Logical   ?Suicidal Thoughts:  No  ?Homicidal Thoughts:  No  ?Memory:  WNL  ?Judgement:  Good  ?Insight:  Good  ?Psychomotor Activity:  Normal  ?Concentration:  Concentration: Good and Attention Span: Fair  ?Recall:  Good  ?Fund of Knowledge: Good  ?Language: Good  ?Assets:  Desire for Improvement  ?ADL's:  Intact  ?Cognition: WNL  ?Prognosis:  Good  ? ?03/04/2021 ?Glucose 96, random ? ?02/04/2021 ?Lipid panel, total cholesterol 213, triglycerides 58, HDL 63, LDL 138 ? ?DIAGNOSES:  ?  ICD-10-CM   ?1. 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  ? ? ?Receiving Psychotherapy: No   ? ? ?RECOMMENDATIONS:  ?PDMP was reviewed.  Ativan filled 11/03/2019.  No other controlled substances listed. ?I provided 30 minutes of non-face-to-face time during this encounter, including time spent before and after the visit in records review, medical decision making, counseling pertinent to today's visit, and charting.  ?I am glad to hear that she is doing so well and is compliant having Abilify injections monthly. ?If the PTSD or  nightmares recur then I recommend restarting Seroquel but at 25 mg nightly. ? ?Continue Abilify Maintena 400 mg IM q. 28 days. ?Return in 3 months. ? ?Melony Overly, PA-C  ?

## 2021-10-13 ENCOUNTER — Ambulatory Visit (INDEPENDENT_AMBULATORY_CARE_PROVIDER_SITE_OTHER): Payer: Self-pay

## 2021-10-13 DIAGNOSIS — F3160 Bipolar disorder, current episode mixed, unspecified: Secondary | ICD-10-CM

## 2021-10-13 NOTE — Progress Notes (Signed)
NURSE VISIT:  ? ?Pt here for her monthly Abilify Maintena 400 mg injection. She received injection IM in left upper gluteal area. Pt reports she has a knot of some sort on her face, right lower cheek area. She has an apt with her doctor tomorrow to get assessed. It does bother her a lot especially being on her face and it does hurt. Informed her to update [place] on what it is and any treatment needed.  ? ?LOT [id number] ?EXP JUL 2025 ?

## 2021-10-14 ENCOUNTER — Other Ambulatory Visit: Payer: Self-pay

## 2021-10-14 ENCOUNTER — Encounter: Payer: Self-pay | Admitting: Family Medicine

## 2021-10-14 ENCOUNTER — Ambulatory Visit (INDEPENDENT_AMBULATORY_CARE_PROVIDER_SITE_OTHER): Payer: Self-pay | Admitting: Family Medicine

## 2021-10-14 DIAGNOSIS — L0291 Cutaneous abscess, unspecified: Secondary | ICD-10-CM

## 2021-10-14 HISTORY — DX: Cutaneous abscess, unspecified: L02.91

## 2021-10-14 MED ORDER — FLUCONAZOLE 150 MG PO TABS: 150.0000 mg | ORAL_TABLET | Freq: Once | ORAL | 0 refills | Status: AC

## 2021-10-14 MED ORDER — CEPHALEXIN 500 MG PO CAPS: 500.0000 mg | ORAL_CAPSULE | Freq: Two times a day (BID) | ORAL | 0 refills | Status: AC

## 2021-10-14 NOTE — Patient Instructions (Signed)
It was wonderful to see you today. ? ?Today we talked about: ? ?-We performed incision and drainage to your abscess.  We are able to get a decent amount of pus out.  The area will likely be sore and red given the pressure that we needed to use. ?-If you start to develop fevers, see red streaks, or have on tolerable pain please return immediately. ?-Sending antibiotics to take twice a day for 5 days.  I have also sent a medication for yeast infection should you develop one.  ?-You can use Tylenol and ibuprofen for pain control. ? ? ?Thank you for choosing [facility].  ? ?Please call [phone] with any questions about today's appointment. ? ?Please be sure to schedule follow up at the front  desk before you leave today.  ? ?Sabino Dick, DO ?PGY-2 Family Medicine   ?

## 2021-10-14 NOTE — Assessment & Plan Note (Signed)
Erythematous, painful, fluctuant, area to left lower face.  Area was drained today without complication.  Only mild amount of purulent discharge was expressed.  Opted to start patient on oral antibiotics given its appearance prior to drainage and possibility that not all was expressed.  Opted for Keflex given her doxycycline allergies.  Strict return precautions provided.  Recommend Tylenol or ibuprofen for pain control. ?

## 2021-10-14 NOTE — Progress Notes (Signed)
    SUBJECTIVE:   CHIEF COMPLAINT / HPI:   Face Lesion States that she previously had a white area to her face for several months. She never messed with it but then later it started to get sore and change colors within the past week. Three weeks ago the area started to get larger. Painful all the time. Pain with chewing. No history of MRSA infections. No insect bites she can recall. Has used some ibuprofen for pain relief. No fevers.  PERTINENT  PMH / PSH:  PTSD, bipolar, asthma   OBJECTIVE:   BP 119/85   Pulse 87   Ht 5\' 4"  (1.626 m)   Wt 168 lb 6.4 oz (76.4 kg)   SpO2 98%   BMI 28.91 kg/m    General: NAD, pleasant, able to participate in exam Skin: warm and dry, no rashes noted, approximately 1.5 x1.5 cm erythematous circular area that is fluctuant and painful to palpation. No streaking erythema. Psych: Normal affect and mood     ASSESSMENT/PLAN:   Abscess Erythematous, painful, fluctuant, area to left lower face.  Area was drained today without complication.  Only mild amount of purulent discharge was expressed.  Opted to start patient on oral antibiotics given its appearance prior to drainage and possibility that not all was expressed.  Opted for Keflex given her doxycycline allergies.  Strict return precautions provided.  Recommend Tylenol or ibuprofen for pain control.   INCISION AND DRAINAGE Performed by: Consent: Verbal consent obtained. Risks and benefits: risks, benefits and alternatives were discussed Type: abscess Body area: left-side face Anesthesia: local infiltration Incision was made with a scalpel. Local anesthetic: lidocaine Anesthetic total: 2 ml Complexity: Low Blunt dissection to break up loculations Drainage: Scant to mild white purulent discharge drained Patient tolerance: Patient tolerated the procedure well with no immediate complications.    Sabino Dick, DO [facility]

## 2021-11-09 ENCOUNTER — Encounter: Payer: Self-pay | Admitting: *Deleted

## 2021-11-12 ENCOUNTER — Ambulatory Visit: Payer: Self-pay | Admitting: Family Medicine

## 2021-11-12 ENCOUNTER — Ambulatory Visit (INDEPENDENT_AMBULATORY_CARE_PROVIDER_SITE_OTHER): Payer: Self-pay | Admitting: Family Medicine

## 2021-11-12 ENCOUNTER — Ambulatory Visit (INDEPENDENT_AMBULATORY_CARE_PROVIDER_SITE_OTHER): Payer: Self-pay

## 2021-11-12 VITALS — BP 119/85 | HR 78 | Ht 64.0 in | Wt 172.2 lb

## 2021-11-12 DIAGNOSIS — F319 Bipolar disorder, unspecified: Secondary | ICD-10-CM

## 2021-11-12 DIAGNOSIS — J339 Nasal polyp, unspecified: Secondary | ICD-10-CM

## 2021-11-12 DIAGNOSIS — R040 Epistaxis: Secondary | ICD-10-CM

## 2021-11-12 DIAGNOSIS — F3160 Bipolar disorder, current episode mixed, unspecified: Secondary | ICD-10-CM

## 2021-11-12 NOTE — Assessment & Plan Note (Addendum)
Possible bilateral nasal polyps seen on exam today, the view was not entirely clear. I will referred to ENT for further evaluation given that she is symptomatic. Recommended that patient does not apply cream to the inside of her nose but can continue using saline spray for symptomatic relief.

## 2021-11-12 NOTE — Progress Notes (Signed)
NURSE VISIT:    Pt here for her monthly Abilify Maintena 400 mg injection. She received injection IM in left upper gluteal area. Tolerated well. No complaints voice. Advised to set up July's injection. She also has apt to see Helene Kelp then.      LOT [id number] EXP JUL 2025

## 2021-11-12 NOTE — Progress Notes (Signed)
     SUBJECTIVE:   CHIEF COMPLAINT / HPI:   Kelly Carr is a 51 y.o. adult presents for nose concern   Nose concern  Pt reports bumps inside the medial aspect of her nose nose for the last few months.  The bumps have not healed and are painful. She has tried saline spray and creams which has not helped. They also bleed sometimes. Pt is not on blood thinners. She feels there is a "funny smell" inside. Sometimes there is yellow purulent drainage too. Ex smoker of 35 years, a pack a day.  No longer smokes but does vape.  Does not have documented history of asthma or aspirin sensitivity.  Flowsheet Row Office Visit from 11/12/2021 in [facility]  PHQ-9 Total Score 0        PERTINENT  PMH / PSH: Carpal tunnel syndrome, rotator cuff tendinitis  OBJECTIVE:   BP 119/85   Pulse 78   Ht 5\' 4"  (1.626 m)   Wt 172 lb 3.2 oz (78.1 kg)   SpO2 100%   BMI 29.56 kg/m    General: Alert, no acute distress HEENT: possible bilateral nasal polyps visualized today on medial aspect of nasal passage-nasal hairs obstructing view, no epistaxis, normal oropharynx, normal Tms bilaterally Cardio: well perfused  Pulm: normal work of breathing Neuro: Cranial nerves grossly intact   ASSESSMENT/PLAN:   Nasal polyp Possible bilateral nasal polyps seen on exam today, the view was not entirely clear. I will referred to ENT for further evaluation given that she is symptomatic. Recommended that patient does not apply cream to the inside of her nose but can continue using saline spray for symptomatic relief.     , MD PGY-3 [facility]

## 2021-11-12 NOTE — Patient Instructions (Signed)
Thank you for coming to see me today. It was a pleasure. Today we discussed your nose bleeds and bumps inside the nose-it could be nasal polyps. I recommend follow up with ENT. I have placed a referral.   If you have any questions or concerns, please do not hesitate to call the office at [phone].  Best wishes,   Dr Allena Katz

## 2021-11-18 ENCOUNTER — Telehealth: Payer: Self-pay | Admitting: Physician Assistant

## 2021-11-18 NOTE — Telephone Encounter (Signed)
Please review

## 2021-11-18 NOTE — Telephone Encounter (Signed)
Pt called at 10:21 am and said that she has court next Friday. She said in the past teresa has written a letter. She needs another letter stating that she is under teresa's care. Her diagnosis and that she is compliant to medication. She will come pick up the letter. Please call her at [phone]

## 2021-11-19 NOTE — Telephone Encounter (Signed)
I sent letter to you.

## 2021-12-13 ENCOUNTER — Ambulatory Visit (INDEPENDENT_AMBULATORY_CARE_PROVIDER_SITE_OTHER): Payer: Self-pay

## 2021-12-13 DIAGNOSIS — F3163 Bipolar disorder, current episode mixed, severe, without psychotic features: Secondary | ICD-10-CM

## 2021-12-13 NOTE — Progress Notes (Signed)
NURSE VISIT:    Pt arrived for her monthly Abilify Maintena 400 mg injection. Last injection was 11/12/2021. Today she received injection IM in left upper gluteal area.  Tolerated well. Pt pleasant and in good spirits. Advised to set up August's injection. She will see [name] the end of this month.    LOT [id number] EXP JUL 2025

## 2021-12-27 ENCOUNTER — Encounter: Payer: Self-pay | Admitting: Physician Assistant

## 2021-12-27 ENCOUNTER — Telehealth (INDEPENDENT_AMBULATORY_CARE_PROVIDER_SITE_OTHER): Payer: Self-pay | Admitting: Physician Assistant

## 2021-12-27 DIAGNOSIS — F3160 Bipolar disorder, current episode mixed, unspecified: Secondary | ICD-10-CM

## 2021-12-27 DIAGNOSIS — F4310 Post-traumatic stress disorder, unspecified: Secondary | ICD-10-CM

## 2021-12-27 DIAGNOSIS — F411 Generalized anxiety disorder: Secondary | ICD-10-CM

## 2021-12-27 NOTE — Progress Notes (Signed)
[facility]  Patient ID: Kelly Carr,  MRN: [id number]  PCP: Sabino Dick, DO  Date of Evaluation: 12/27/2021 Time spent:20 minutes  Chief Complaint:  Chief Complaint   Follow-up    Virtual Visit via Telehealth  I connected with patient by a video enabled telemedicine application with their informed consent, and verified patient privacy and that I am speaking with the correct person using two identifiers.  I am private, in my office and the patient is at home.  I discussed the limitations, risks, security and privacy concerns of performing an evaluation and management service by video and the availability of in person appointments. I also discussed with the patient that there may be a patient responsible charge related to this service. The patient expressed understanding and agreed to proceed.  We started out with a video but I was unable to hear her so we changed to telephone.   I discussed the assessment and treatment plan with the patient. The patient was provided an opportunity to ask questions and all were answered. The patient agreed with the plan and demonstrated an understanding of the instructions.   The patient was advised to call back or seek an in-person evaluation if the symptoms worsen or if the condition fails to improve as anticipated.  I provided  20 minutes of non-face-to-face time during this encounter.  HISTORY/CURRENT STATUS: HPI for routine med check.  Still doing well with the monthly injection Abilify Maintena.  She has been diligent about that routine and has not had any manic or depressive episodes in months now.  Her only complaint is weight gain.  She is not working right now and it is too hot to get out to do anything, she eats out of boredom but feels like once she gets back to work she will be able to lose some.  She is able to enjoy things.  Denies decreased energy.  Denies decreased motivation.  ADLs and personal hygiene are  normal.  No extreme sadness, tearfulness, or feelings of hopelessness.  Sleeps well most of the time.  Denies any changes in concentration, making decisions or remembering things.  Denies anxiety at this time.  No panic attacks.  Denies suicidal or homicidal thoughts.  No increased energy with decreased need for sleep, no increased talkativeness, no racing thoughts, no impulsivity or risky behaviors, no increased spending, no increased libido, no grandiosity, no increased irritability or anger, no paranoia, and no hallucinations.  Denies dizziness, syncope, seizures, numbness, tingling, tremor, tics, unsteady gait, slurred speech, confusion. Denies muscle or joint pain, stiffness, or dystonia. Denies unexplained weight loss, frequent infections, or sores that heal slowly.  No polyphagia, polydipsia, or polyuria. Denies visual changes or paresthesias.   Individual Medical History/ Review of Systems: Changes? :No      Past medications for mental health diagnoses include: Gabapentin caused lethargy, Ativan, BuSpar, Wellbutrin, Latuda, trazodone, Cogentin, Geodon, Trileptal, Vraylar, prazosin, Vistaril, Abilify maintainena, Abilify, Risperdal made her a zombie, Zyprexa made her a zombie, Lithium, Seroquel for PTSD but at 150 mg made her groggy (tolerated 25 mg long ago)  Allergies: Doxycycline, Gabapentin, Haldol [haloperidol lactate], Risperidone and related, Tramadol, and Valproic acid  Current Medications:  Current Outpatient Medications:    ARIPiprazole ER (ABILIFY MAINTENA) 400 MG SRER injection, Inject 2 mLs (400 mg total) into the muscle every 28 (twenty-eight) days., Disp: 1 each, Rfl: 3 Medication Side Effects: none  Family Medical/ Social History: Changes?  Court cases are behind her, all charges were dropped. She  and BF are getting along well now. They've been back together for 7 months now. Not working right now, she wanted to get the court stuff done first.  Also she does not have a car  so will have to get transportation to go to work. Daughter and granddaughter live with them now, which her BF doesn't like. She will be starting back to school in September.  MENTAL HEALTH EXAM:  There were no vitals taken for this visit.There is no height or weight on file to calculate BMI.  General Appearance: Casual, Neat and Well Groomed  Eye Contact:  Good  Speech:  Clear and Coherent and Normal Rate  Volume:  Normal  Mood:  Euthymic  Affect:  Congruent  Thought Process:  Goal Directed and Descriptions of Associations: Circumstantial  Orientation:  Full (Time, Place, and Person)  Thought Content: Logical   Suicidal Thoughts:  No  Homicidal Thoughts:  No  Memory:  WNL  Judgement:  Good  Insight:  Good  Psychomotor Activity:  Normal  Concentration:  Concentration: Good and Attention Span: Fair  Recall:  Good  Fund of Knowledge: Good  Language: Good  Assets:  Desire for Improvement Housing  ADL's:  Intact  Cognition: WNL  Prognosis:  Good   03/04/2021 Glucose 96, random  02/04/2021 Lipid panel, total cholesterol 213, triglycerides 58, HDL 63, LDL 138  DIAGNOSES:    ICD-10-CM   1. 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 Generalized anxiety disorder  F41.1     3. PTSD (post-traumatic stress disorder)  F43.10       Receiving Psychotherapy: No   saw a therapist at [facility] but it was not a good fit so she stopped going.   RECOMMENDATIONS:  PDMP was reviewed.  No results available I provided 20 minutes of non-face-to-face time during this encounter, including time spent before and after the visit in records review, medical decision making, counseling pertinent to today's visit, and charting.   She is doing well so no change in treatment necessary. Continue Abilify Maintena 400 mg IM q. 28 days. Return in 3 months.  Melony Overly, PA-C

## 2022-01-12 ENCOUNTER — Ambulatory Visit (INDEPENDENT_AMBULATORY_CARE_PROVIDER_SITE_OTHER): Payer: Self-pay

## 2022-01-12 DIAGNOSIS — F3160 Bipolar disorder, current episode mixed, unspecified: Secondary | ICD-10-CM

## 2022-01-17 NOTE — Progress Notes (Unsigned)
NURSES NOTE:  Pt arrived for her monthly Abilify Maintena 400 mg injection. Last injection was 12/13/2021. Today she received injection IM in left upper gluteal area.  Tolerated well. Pt pleasant and in good spirits. Advised to set up September's injection.      LOT [id number] EXP JUL 2025

## 2022-02-03 ENCOUNTER — Ambulatory Visit: Payer: Self-pay | Admitting: Family Medicine

## 2022-02-03 ENCOUNTER — Encounter: Payer: Self-pay | Admitting: Family Medicine

## 2022-02-16 ENCOUNTER — Ambulatory Visit (INDEPENDENT_AMBULATORY_CARE_PROVIDER_SITE_OTHER): Payer: Self-pay

## 2022-02-16 DIAGNOSIS — F3160 Bipolar disorder, current episode mixed, unspecified: Secondary | ICD-10-CM

## 2022-02-16 NOTE — Progress Notes (Signed)
NURSES NOTE:   Pt arrived for her monthly Abilify Maintena 400 mg injection. Last injection was 01/12/2022. Today she received injection IM in left upper gluteal area.  Tolerated well. Pt pleasant and in good spirits. She starts a new job next week at [employer] and looking forward to it. Advised to set up October's injection for 28 days. No complaints voiced.     LOT [id number] EXP JUL 2025

## 2022-03-16 ENCOUNTER — Ambulatory Visit (INDEPENDENT_AMBULATORY_CARE_PROVIDER_SITE_OTHER): Payer: 59

## 2022-03-16 DIAGNOSIS — R69 Illness, unspecified: Secondary | ICD-10-CM | POA: Diagnosis not present

## 2022-03-16 DIAGNOSIS — F314 Bipolar disorder, current episode depressed, severe, without psychotic features: Secondary | ICD-10-CM

## 2022-03-16 NOTE — Progress Notes (Signed)
NURSES NOTE:   Pt arrived for her monthly Abilify Maintena 400 mg injection. Last injection was 02/16/2022. Today she received injection IM in left upper gluteal area.  Tolerated well. Pt pleasant and in good spirits. She started working back at [employer], about 32 hours weekly. Advised to set up November's injection for 28 days. No complaints voiced.     LOT [id number] EXP JUL 2025

## 2022-03-18 ENCOUNTER — Ambulatory Visit
Admission: EM | Admit: 2022-03-18 | Discharge: 2022-03-18 | Disposition: A | Discharge status: Home / Self Care | Payer: Self-pay

## 2022-03-18 DIAGNOSIS — J039 Acute tonsillitis, unspecified: Secondary | ICD-10-CM | POA: Insufficient documentation

## 2022-03-18 DIAGNOSIS — J029 Acute pharyngitis, unspecified: Secondary | ICD-10-CM | POA: Insufficient documentation

## 2022-03-18 LAB — POCT RAPID STREP A (OFFICE): Rapid Strep A Screen: NEGATIVE

## 2022-03-18 MED ORDER — PREDNISONE 50 MG PO TABS: 50.0000 mg | ORAL_TABLET | Freq: Every day | ORAL | 0 refills | Status: AC

## 2022-03-18 NOTE — ED Triage Notes (Signed)
Pt. Presents w/ a sore throat and cough for the past few days.

## 2022-03-18 NOTE — ED Provider Notes (Signed)
[facility]    CSN: [id number] Arrival date & time: 03/18/22  1722      History   Chief Complaint Chief Complaint  Patient presents with   Sore Throat   Cough    HPI Kelly Carr is a 51 y.o. adult.    Sore Throat  Cough   Presents to UC with complaint of sore throat and cough for the past few days.  Endorses painful swallowing especially on the right side of her throat.  Denies fever.  Denies nausea/vomiting/diarrhea.  Past Medical History:  Diagnosis Date   Abnormal Pap smear    Unknown results>colpo>normal   Anxiety    Arthritis    Asthma    Bipolar 1 disorder ([place])    Depression    Orbital floor (blow-out) closed fracture ([place]) 10/17/2018   PTSD (post-traumatic stress disorder)     Patient Active Problem List   Diagnosis Date Noted   Nasal polyp 11/12/2021   Abscess 10/14/2021   Cervical strain 05/04/2021   Strain of lumbar region 05/04/2021   Rotator cuff tendinitis, right 05/04/2021   Forearm strain, left, initial encounter 05/04/2021   Cervical radiculopathy 04/07/2021   Paresthesia and pain of extremity 04/07/2021   Blister of left foot 03/08/2021   Bipolar I disorder with depression ([place]) 02/05/2021   Bipolar 1 disorder, mixed ([place]) 02/05/2021   Seasonal allergies 07/22/2020   Acetaminophen overdose, intentional self-harm, initial encounter ([place]) 09/23/2019   Cannabis abuse 11/01/2018   Severe manic bipolar 1 disorder with psychotic behavior ([place]) 10/31/2018   Orbital floor (blow-out) closed fracture ([place]) 10/17/2018   PTSD (post-traumatic stress disorder) 03/21/2018   Major depressive disorder, 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/17/2017   Manic behavior ([place])    Piriformis syndrome of right side 02/23/2016   HLD (hyperlipidemia) 11/12/2015   Normocytic anemia 05/26/2015   Left upper quadrant abdominal pain 05/04/2015   Low grade squamous intraepithelial lesion (LGSIL) on cervical Pap smear 03/04/2015   Labral  tear of hip joint 12/27/2014   Irregular menses 07/17/2013   GERD (gastroesophageal reflux disease) 11/10/2010   Carpal tunnel syndrome of left wrist 11/10/2010   Severe bipolar I disorder, current or most recent episode mixed ([place]) 11/25/2008   DEPRESSION 10/21/2008    Past Surgical History:  Procedure Laterality Date   NO PAST SURGERIES      OB History     Gravida  4   Para  2   Term  2   Preterm      AB  2   Living  2      SAB  2   IAB      Ectopic      Multiple      Live Births  2            Home Medications    Prior to Admission medications   Medication Sig Start Date End Date Taking? Authorizing Provider  Oxcarbazepine (TRILEPTAL) 300 MG tablet  01/22/21  Yes [provider]  prazosin (MINIPRESS) 1 MG capsule Take by mouth. 02/10/21  Yes [provider]  predniSONE (DELTASONE) 10 MG tablet  01/12/21  Yes [provider]  pregabalin (LYRICA) 75 MG capsule Take by mouth. 03/08/21  Yes [provider]  acetaminophen (TYLENOL) 500 MG tablet Take by mouth.    [provider]  ARIPiprazole ER (ABILIFY MAINTENA) 400 MG SRER injection Inject 2 mLs (400 mg total) into the muscle every 28 (twenty-eight) days. 06/15/20   Domingo Cocking,  Hedwig Morton, MD  ibuprofen (ADVIL) 600 MG tablet Take by mouth.    [provider]    Family History Family History  Problem Relation Age of Onset   Diabetes Mother    Hypertension Mother    Heart disease Mother 33   Schizophrenia Mother    Diabetes Maternal Grandmother    Heart disease Maternal Grandmother    Diabetes Maternal Grandfather    Heart disease Maternal Grandfather    Depression Daughter    Bipolar disorder Cousin    Bipolar disorder Nephew     Social History Social History   Tobacco Use   Smoking status: Every Day    Types: E-cigarettes   Smokeless tobacco: Never  Vaping Use   Vaping Use: Every day   Substances: Nicotine, Flavoring  Substance Use Topics    Alcohol use: Not Currently    Comment: 1 per month   Drug use: Yes    Types: Marijuana    Comment: Patiaent states that she eats THC gummies     Allergies   Doxycycline, Gabapentin, Haldol [haloperidol lactate], Risperidone and related, Tramadol, and Valproic acid   Review of Systems Review of Systems  Respiratory:  Positive for cough.      Physical Exam Triage Vital Signs ED Triage Vitals  Enc Vitals Group     BP 03/18/22 1748 137/88     Pulse Rate 03/18/22 1748 81     Resp 03/18/22 1748 13     Temp 03/18/22 1748 98 F (36.7 C)     Temp src --      SpO2 03/18/22 1748 96 %     Weight --      Height --      Head Circumference --      Peak Flow --      Pain Score 03/18/22 1749 10     Pain Loc --      Pain Edu? --      Excl. in [place]? --    No data found.  Updated Vital Signs BP 137/88   Pulse 81   Temp 98 F (36.7 C)   Resp 13   SpO2 96%   Visual Acuity Right Eye Distance:   Left Eye Distance:   Bilateral Distance:    Right Eye Near:   Left Eye Near:    Bilateral Near:     Physical Exam Vitals reviewed.  Constitutional:      Appearance: She is well-developed.  HENT:     Mouth/Throat:     Mouth: Mucous membranes are moist.     Pharynx: Posterior oropharyngeal erythema present.     Tonsils: No tonsillar exudate. 4+ on the right. 1+ on the left.  Skin:    General: Skin is warm and dry.  Neurological:     General: No focal deficit present.     Mental Status: She is alert and oriented to person, place, and time.  Psychiatric:        Mood and Affect: Mood normal.        Behavior: Behavior normal.      UC Treatments / Results  Labs (all labs ordered are listed, but only abnormal results are displayed) Labs Reviewed - No data to display  EKG   Radiology No results found.  Procedures Procedures (including critical care time)  Medications Ordered in UC Medications - No data to display  Initial Impression / Assessment and Plan / UC Course   I have reviewed the triage vital signs  and the nursing notes.  Pertinent labs & imaging results that were available during my care of the patient were reviewed by me and considered in my medical decision making (see chart for details).   Rapid strep is negative.  No peritonsillar exudates are seen, however right side tonsil is 4+ without abscess visible.  Will send strep culture to confirm diagnosis and treat acute tonsillitis with a short course of steroids.  Patient endorses taking previously and tolerating.   Final Clinical Impressions(s) / UC Diagnoses   Final diagnoses:  None   Discharge Instructions   None    ED Prescriptions   None    PDMP not reviewed this encounter.   Rose Phi, Z[place] 03/18/22 1806

## 2022-03-18 NOTE — Discharge Instructions (Addendum)
Follow up here or with your primary care provider if your symptoms are worsening or not improving with treatment.     

## 2022-03-21 LAB — CULTURE, GROUP A STREP (THRC)

## 2022-03-28 ENCOUNTER — Encounter: Payer: Self-pay | Admitting: Physician Assistant

## 2022-03-28 ENCOUNTER — Telehealth (INDEPENDENT_AMBULATORY_CARE_PROVIDER_SITE_OTHER): Payer: 59 | Admitting: Physician Assistant

## 2022-03-28 DIAGNOSIS — Z79899 Other long term (current) drug therapy: Secondary | ICD-10-CM | POA: Diagnosis not present

## 2022-03-28 DIAGNOSIS — F411 Generalized anxiety disorder: Secondary | ICD-10-CM

## 2022-03-28 DIAGNOSIS — F4310 Post-traumatic stress disorder, unspecified: Secondary | ICD-10-CM | POA: Diagnosis not present

## 2022-03-28 DIAGNOSIS — R69 Illness, unspecified: Secondary | ICD-10-CM | POA: Diagnosis not present

## 2022-03-28 DIAGNOSIS — F319 Bipolar disorder, unspecified: Secondary | ICD-10-CM

## 2022-03-28 DIAGNOSIS — G4700 Insomnia, unspecified: Secondary | ICD-10-CM

## 2022-03-28 NOTE — Progress Notes (Signed)
[facility]  Patient ID: Kelly Carr,  MRN: [id number]  PCP: Sharion Settler, DO  Date of Evaluation: 03/28/2022 Time spent:20 minutes  Chief Complaint:  Chief Complaint   Follow-up    Virtual Visit via Telehealth  I connected with patient by telephone (attempt was made for a video appointment, but we had technical difficulties where she could see me but I could not see her.  We had to do the visit by phone), with their informed consent, and verified patient privacy and that I am speaking with the correct person using two identifiers.  I am private, in my office and the patient is at home.  I discussed the limitations, risks, security and privacy concerns of performing an evaluation and management service by telephone and the availability of in person appointments. I also discussed with the patient that there may be a patient responsible charge related to this service. The patient expressed understanding and agreed to proceed.   I discussed the assessment and treatment plan with the patient. The patient was provided an opportunity to ask questions and all were answered. The patient agreed with the plan and demonstrated an understanding of the instructions.   The patient was advised to call back or seek an in-person evaluation if the symptoms worsen or if the condition fails to improve as anticipated.  I provided 20 minutes of non-face-to-face time during this encounter.   HISTORY/CURRENT STATUS: HPI for routine med check.  [name] states she is doing very well.  She has been diligent about getting the monthly Abilify shot.  Feels that it is working well.  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  She has worked there before.  Likes her job so far.  Not missing any work.  Patient is able to enjoy things.  Energy and motivation are good.  No extreme sadness, tearfulness, or feelings of hopelessness.  Sleeps well . ADLs and personal hygiene are normal.    Denies any changes in concentration, making decisions, or remembering things.  Appetite has not changed.  Weight is stable.  She occasionally has circumstantial anxiety but it is not severe and she does not feel the need for any medications for it.  Denies suicidal or homicidal thoughts.  Patient denies increased energy with decreased need for sleep, increased talkativeness, racing thoughts, impulsivity or risky behaviors, increased spending, increased libido, grandiosity, increased irritability or anger, paranoia, or hallucinations.  Denies dizziness, syncope, seizures, numbness, tingling, tremor, tics, unsteady gait, slurred speech, confusion. Denies muscle or joint pain, stiffness, or dystonia. Denies unexplained weight loss, frequent infections, or sores that heal slowly.  No polyphagia, polydipsia, or polyuria. Denies visual changes or paresthesias.   Individual Medical History/ Review of Systems: Changes? :No      Past medications for mental health diagnoses include: Gabapentin caused lethargy, Ativan, BuSpar, Wellbutrin, Latuda, trazodone, Cogentin, Geodon, Trileptal, Vraylar, prazosin, Vistaril, Abilify maintainena, Abilify, Risperdal made her a zombie, Zyprexa made her a zombie, Lithium, Seroquel for PTSD but at 150 mg made her groggy (tolerated 25 mg long ago)  Allergies: Doxycycline, Gabapentin, Haldol [haloperidol lactate], Risperidone and related, Tramadol, and Valproic acid  Current Medications:  Current Outpatient Medications:    acetaminophen (TYLENOL) 500 MG tablet, Take by mouth., Disp: , Rfl:    ARIPiprazole ER (ABILIFY MAINTENA) 400 MG SRER injection, Inject 2 mLs (400 mg total) into the muscle every 28 (twenty-eight) days., Disp: 1 each, Rfl: 3   ibuprofen (ADVIL) 600 MG tablet, Take  by mouth., Disp: , Rfl:  Medication Side Effects: none  Family Medical/ Social History: Changes?  Started working at [employer] 3 weeks ago.  MENTAL HEALTH EXAM:  There were no vitals taken  for this visit.There is no height or weight on file to calculate BMI.  General Appearance:  Unable to assess  Eye Contact:   Unable to assess  Speech:  Clear and Coherent and Normal Rate  Volume:  Normal  Mood:  Euthymic  Affect:   Unable to assess  Thought Process:  Goal Directed and Descriptions of Associations: Circumstantial  Orientation:  Full (Time, Place, and Person)  Thought Content: Logical   Suicidal Thoughts:  No  Homicidal Thoughts:  No  Memory:  WNL  Judgement:  Good  Insight:  Good  Psychomotor Activity:   Unable to assess  Concentration:  Concentration: Good and Attention Span: Fair  Recall:  Good  Fund of Knowledge: Good  Language: Good  Assets:  Desire for Improvement Housing  ADL's:  Intact  Cognition: WNL  Prognosis:  Good   DIAGNOSES:    ICD-10-CM   1. Bipolar I disorder (HCC)  F31.9 Hemoglobin A1c    Basic metabolic panel    Lipid panel    2. PTSD (post-traumatic stress disorder)  F43.10     3. Generalized anxiety disorder  F41.1     4. Insomnia, unspecified type  G47.00     5. Encounter for long-term (current) use of medications  Z79.899 Hemoglobin A1c    Basic metabolic panel    Lipid panel      Receiving Psychotherapy: No   saw a therapist at [facility] but it was not a good fit so she stopped going.  RECOMMENDATIONS:  PDMP was reviewed.  No results available I provided 20 minutes of non-face-to-face time during this encounter, including time spent before and after the visit in records review, medical decision making, counseling pertinent to today's visit, and charting.   I am glad she is doing so well.  No change needs to be made.  Continue Abilify Maintena 400 mg IM q. 28 days. Labs ordered as above.  Orders will be mailed to her. Return in 4 months.  Melony Overly, PA-C

## 2022-04-14 ENCOUNTER — Ambulatory Visit: Payer: 59

## 2022-04-14 DIAGNOSIS — F319 Bipolar disorder, unspecified: Secondary | ICD-10-CM

## 2022-04-14 NOTE — Progress Notes (Signed)
NURSES NOTE:   Pt arrived for her monthly Abilify Maintena 400 mg injection. Last injection was 03/16/2022. Today she received injection IM in left upper gluteal area.  Tolerated well. Pt pleasant and in good spirits. No complaints or issues. She is still working, about 32 hours weekly. Advised to set up December's injection for 28 days. Instructed to call if she is having any questions or concerns.      LOT [id number] EXP JUL 2025

## 2022-05-16 ENCOUNTER — Ambulatory Visit: Payer: Commercial Managed Care - HMO

## 2022-05-16 DIAGNOSIS — F319 Bipolar disorder, unspecified: Secondary | ICD-10-CM

## 2022-05-16 NOTE — Progress Notes (Signed)
NURSES NOTE:   Pt arrived for her monthly Abilify Maintena 400 mg injection. Last injection was 04/14/2022. Today she received injection IM in left upper gluteal area.  Tolerated well. Pt pleasant and in good spirits. No complaints or issues. She is still working, about 32 hours weekly. Advised to set up January injection for 28 days. Instructed to call if she is having any questions or concerns.      LOT [id number] EXP AUG 2026

## 2022-05-23 ENCOUNTER — Other Ambulatory Visit: Payer: Self-pay | Admitting: Podiatry

## 2022-05-23 ENCOUNTER — Ambulatory Visit (INDEPENDENT_AMBULATORY_CARE_PROVIDER_SITE_OTHER): Payer: Commercial Managed Care - HMO

## 2022-05-23 ENCOUNTER — Encounter: Payer: Self-pay | Admitting: Podiatry

## 2022-05-23 ENCOUNTER — Ambulatory Visit: Payer: Commercial Managed Care - HMO | Admitting: Podiatry

## 2022-05-23 DIAGNOSIS — M722 Plantar fascial fibromatosis: Secondary | ICD-10-CM | POA: Diagnosis not present

## 2022-05-23 MED ORDER — MELOXICAM 15 MG PO TABS: 15.0000 mg | ORAL_TABLET | Freq: Every day | ORAL | 3 refills | Status: DC

## 2022-05-23 MED ORDER — METHYLPREDNISOLONE 4 MG PO TBPK: ORAL_TABLET | ORAL | 0 refills | Status: DC

## 2022-05-23 MED ORDER — TRIAMCINOLONE ACETONIDE 40 MG/ML IJ SUSP
20.0000 mg | Freq: Once | INTRAMUSCULAR | Status: AC
  Administered 2022-05-23: 20 mg

## 2022-05-23 NOTE — Patient Instructions (Signed)

## 2022-05-23 NOTE — Progress Notes (Signed)
Subjective:  Patient ID: Kelly Carr, adult    DOB: 1970/07/26,  MRN: [id number] HPI Chief Complaint  Patient presents with   Foot Pain    Plantar heel bilateral (R>L) - aching x couple months, AM pain, tried Ibuprofen   New Patient (Initial Visit)    51 y.o. adult presents with the above complaint.   ROS: Denies fever chills nausea vomit muscle aches pains calf pain back pain chest pain shortness of breath.  Past Medical History:  Diagnosis Date   Abnormal Pap smear    Unknown results>colpo>normal   Anxiety    Arthritis    Asthma    Bipolar 1 disorder (HCC)    Depression    Orbital floor (blow-out) closed fracture ([facility]CC) 10/17/2018   PTSD (post-traumatic stress disorder)    Past Surgical History:  Procedure Laterality Date   NO PAST SURGERIES      Current Outpatient Medications:    meloxicam (MOBIC) 15 MG tablet, Take 1 tablet (15 mg total) by mouth daily., Disp: 30 tablet, Rfl: 3   methylPREDNISolone (MEDROL DOSEPAK) 4 MG TBPK tablet, 6 day dose pack - take as directed, Disp: 21 tablet, Rfl: 0   acetaminophen (TYLENOL) 500 MG tablet, Take by mouth., Disp: , Rfl:    ARIPiprazole ER (ABILIFY MAINTENA) 400 MG SRER injection, Inject 2 mLs (400 mg total) into the muscle every 28 (twenty-eight) days., Disp: 1 each, Rfl: 3   ibuprofen (ADVIL) 600 MG tablet, Take by mouth., Disp: , Rfl:   Allergies  Allergen Reactions   Doxycycline Itching   Gabapentin Other (See Comments)    Difficulty moving   Haldol [Haloperidol Lactate] Other (See Comments)    Syncope    Risperidone And Related Other (See Comments)    Zones out   Tramadol Itching   Valproic Acid Swelling   Review of Systems Objective:  There were no vitals filed for this visit.  General: Well developed, nourished, in no acute distress, alert and oriented x3   Dermatological: Skin is warm, dry and supple bilateral. Nails x 10 are well maintained; remaining integument appears unremarkable at this time. There  are no open sores, no preulcerative lesions, no rash or signs of infection present.  Vascular: Dorsalis Pedis artery and Posterior Tibial artery pedal pulses are 2/4 bilateral with immedate capillary fill time. Pedal hair growth present. No varicosities and no lower extremity edema present bilateral.   Neruologic: Grossly intact via light touch bilateral. Vibratory intact via tuning fork bilateral. Protective threshold with Semmes Wienstein monofilament intact to all pedal sites bilateral. Patellar and Achilles deep tendon reflexes 2+ bilateral. No Babinski or clonus noted bilateral.   Musculoskeletal: No gross boney pedal deformities bilateral. No pain, crepitus, or limitation noted with foot and ankle range of motion bilateral. Muscular strength 5/5 in all groups tested bilateral.  Severe pain on palpation medial calcaneal tubercle of the right heel.  Moderate edema and warmth on palpation right.  Minimal tenderness on palpation left heel at the plantar fascial calcaneal insertion site.  Gait: Unassisted, Nonantalgic.    Radiographs:  Radiographs taken today demonstrate an osseously mature individual right foot demonstrates small calcaneal heel spur with a soft tissue increase in density at the plantar fascial calcaneal insertion site.  Left foot demonstrates a larger heel spur with soft tissue increase in density.  Otherwise no significant abnormalities.  Assessment & Plan:   Assessment: Planter fasciitis right greater than left  Plan: Discussed etiology pathology conservative versus surgical therapies I injected the right  heel with 20 mg Kenalog 5 mg Marcaine point of maximal tenderness after sterile Betadine skin prep.  Tolerated the procedure well.  Started her on methylprednisolone to be followed by meloxicam.  Placed her in bilateral plantar fascia braces.  Discussed appropriate shoe gear with her.  Follow-up with her in 1 month  She is going to [employer] in February     Kelly Carr T.  [place], [place]

## 2022-06-14 ENCOUNTER — Ambulatory Visit: Payer: Self-pay | Admitting: Family Medicine

## 2022-06-14 DIAGNOSIS — F411 Generalized anxiety disorder: Secondary | ICD-10-CM | POA: Insufficient documentation

## 2022-06-14 NOTE — Progress Notes (Deleted)
    SUBJECTIVE:   CHIEF COMPLAINT / HPI:   ***  PERTINENT  PMH / PSH: Bipolar 1  OBJECTIVE:   There were no vitals taken for this visit.  ***  ASSESSMENT/PLAN:   There are no diagnoses linked to this encounter. No follow-ups on file.  Salvadore Oxford, MD [place]

## 2022-06-17 ENCOUNTER — Ambulatory Visit (INDEPENDENT_AMBULATORY_CARE_PROVIDER_SITE_OTHER): Payer: Commercial Managed Care - HMO

## 2022-06-17 DIAGNOSIS — F319 Bipolar disorder, unspecified: Secondary | ICD-10-CM | POA: Diagnosis not present

## 2022-06-17 NOTE — Progress Notes (Signed)
NURSES NOTE:   Pt arrived for her monthly Abilify Maintena 400 mg injection. Last injection was 05/16/2022. Today she received injection IM in left upper gluteal area.  Tolerated well. Pt pleasant and in good spirits. No complaints or issues. She is still working, about 32 hours weekly at [employer]. Advised to set up February injection for 28 days. Instructed to call if she is having any questions or concerns.      LOT [id number] EXP APR 2026

## 2022-06-29 ENCOUNTER — Ambulatory Visit: Payer: Commercial Managed Care - HMO | Admitting: Podiatry

## 2022-06-29 ENCOUNTER — Encounter: Payer: Self-pay | Admitting: Podiatry

## 2022-06-29 VITALS — BP 132/86 | HR 86

## 2022-06-29 DIAGNOSIS — M722 Plantar fascial fibromatosis: Secondary | ICD-10-CM

## 2022-06-29 MED ORDER — CELECOXIB 200 MG PO CAPS: 200.0000 mg | ORAL_CAPSULE | Freq: Two times a day (BID) | ORAL | 3 refills | Status: DC

## 2022-06-29 MED ORDER — TRIAMCINOLONE ACETONIDE 40 MG/ML IJ SUSP
40.0000 mg | Freq: Once | INTRAMUSCULAR | Status: AC
  Administered 2022-06-29: 40 mg

## 2022-06-29 NOTE — Progress Notes (Signed)
She presents today for follow-up of her Planter fasciitis to her right foot.  She states that her left foot is starting to hurt as well.  States that she continues to wear her plantar fascial brace and take her anti-inflammatories.  States that she approximately 30% improved.  She has new shoes with inserts.  Objective: Vital signs are stable she is alert and oriented x 3 pulses are palpable.  Moderate to severe pain on palpation medial calcaneal tubercle bilateral.  Assessment: Chronic intractable Planter fasciitis right foot early Planter fasciitis left foot.  Plan: Changed her meloxicam to Celebrex 200 mg 1 p.o. twice daily and also reinjected the bilateral heels 20 mg triamcinolone 5 mg Marcaine point maximal tenderness.  Tolerated procedure well without complications.  Follow-up with her in 1 month

## 2022-07-15 ENCOUNTER — Ambulatory Visit (INDEPENDENT_AMBULATORY_CARE_PROVIDER_SITE_OTHER): Payer: Commercial Managed Care - HMO

## 2022-07-15 ENCOUNTER — Ambulatory Visit: Payer: Commercial Managed Care - HMO

## 2022-07-15 DIAGNOSIS — F319 Bipolar disorder, unspecified: Secondary | ICD-10-CM

## 2022-07-25 ENCOUNTER — Ambulatory Visit: Payer: Commercial Managed Care - HMO | Admitting: Obstetrics and Gynecology

## 2022-07-25 ENCOUNTER — Encounter: Payer: Self-pay | Admitting: Obstetrics and Gynecology

## 2022-07-26 NOTE — Progress Notes (Signed)
Patient did not keep her new GYN appointment for 07/25/2022.  Durene Romans MD Attending Center for [employer] [facility])

## 2022-08-05 MED ORDER — ARIPIPRAZOLE ER 400 MG IM PRSY: 400.0000 mg | PREFILLED_SYRINGE | INTRAMUSCULAR | Status: DC

## 2022-08-05 NOTE — Progress Notes (Signed)
NURSES NOTE:   Pt arrived for her monthly Abilify Maintena 400 mg injection. Last injection was 06/16/2022. Today she received injection IM in left upper gluteal area.  Tolerated well. Pt pleasant and in good spirits. No complaints or issues. Advised to schedule next injection in 28 days, March 8th. Instructed to call if she is having any questions or concerns.      LOT [id number] EXP APR 2026

## 2022-08-11 ENCOUNTER — Ambulatory Visit (INDEPENDENT_AMBULATORY_CARE_PROVIDER_SITE_OTHER): Payer: Commercial Managed Care - HMO

## 2022-08-11 DIAGNOSIS — F319 Bipolar disorder, unspecified: Secondary | ICD-10-CM

## 2022-08-11 DIAGNOSIS — F312 Bipolar disorder, current episode manic severe with psychotic features: Secondary | ICD-10-CM | POA: Diagnosis not present

## 2022-08-23 ENCOUNTER — Telehealth (INDEPENDENT_AMBULATORY_CARE_PROVIDER_SITE_OTHER): Payer: Commercial Managed Care - HMO | Admitting: Physician Assistant

## 2022-08-23 ENCOUNTER — Encounter: Payer: Self-pay | Admitting: Physician Assistant

## 2022-08-23 DIAGNOSIS — F4310 Post-traumatic stress disorder, unspecified: Secondary | ICD-10-CM | POA: Diagnosis not present

## 2022-08-23 DIAGNOSIS — F411 Generalized anxiety disorder: Secondary | ICD-10-CM

## 2022-08-23 DIAGNOSIS — F319 Bipolar disorder, unspecified: Secondary | ICD-10-CM | POA: Diagnosis not present

## 2022-08-23 DIAGNOSIS — Z720 Tobacco use: Secondary | ICD-10-CM | POA: Diagnosis not present

## 2022-08-23 MED ORDER — HYDROXYZINE HCL 10 MG PO TABS: 10.0000 mg | ORAL_TABLET | Freq: Three times a day (TID) | ORAL | 0 refills | Status: DC | PRN
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
## 2022-08-29 ENCOUNTER — Other Ambulatory Visit: Payer: Self-pay | Admitting: Obstetrics & Gynecology

## 2022-08-29 DIAGNOSIS — Z1231 Encounter for screening mammogram for malignant neoplasm of breast: Secondary | ICD-10-CM

## 2022-08-30 ENCOUNTER — Ambulatory Visit (INDEPENDENT_AMBULATORY_CARE_PROVIDER_SITE_OTHER): Payer: Commercial Managed Care - HMO | Admitting: Student

## 2022-08-30 ENCOUNTER — Ambulatory Visit: Payer: Commercial Managed Care - HMO | Admitting: Podiatry

## 2022-08-30 ENCOUNTER — Other Ambulatory Visit: Payer: Self-pay

## 2022-08-30 VITALS — BP 152/84 | HR 113 | Ht 64.0 in | Wt 173.4 lb

## 2022-08-30 DIAGNOSIS — U071 COVID-19: Secondary | ICD-10-CM | POA: Diagnosis not present

## 2022-08-30 DIAGNOSIS — R0981 Nasal congestion: Secondary | ICD-10-CM

## 2022-08-30 LAB — POC SOFIA 2 FLU + SARS ANTIGEN FIA
Influenza A, POC: NEGATIVE
Influenza B, POC: NEGATIVE
SARS Coronavirus 2 Ag: POSITIVE — AB

## 2022-08-30 MED ORDER — FLUTICASONE PROPIONATE 50 MCG/ACT NA SUSP
2.0000 | Freq: Every day | NASAL | 0 refills | Status: DC
Start: 2022-08-30 — End: 2022-09-21

## 2022-08-30 NOTE — Progress Notes (Signed)
    SUBJECTIVE:   CHIEF COMPLAINT / HPI:   Meloxicam changed to celebrex BID by podiatry on January for plantar fasciitis. Took it last week and the week before. Hands cramping and notices some red tiny marks on leg. Feeling palpitations after taking it. No diarrhea/vomiting. She has stopped taking it and is doing ibuprofen. She is seeing her podiatrist this afternoon for foot pain.  Comes here today due to 3 days of symptoms of coughing, sneezing, sore throat.  Difficult swallowing this morning do to sore throat. No diarrhea and vomting. Has body aches Exposed to covid and flu at her work at [employer] and her grand daughter was sick Unsure of fevers Used albuterol last night and this morning but not overly frequent  PERTINENT  PMH / PSH:   OBJECTIVE:   BP (!) 152/84   Pulse (!) 113   Ht 5\' 4"  (1.626 m)   Wt 173 lb 6.4 oz (78.7 kg)   SpO2 96%   BMI 29.76 kg/m   General: Ill appearing bu non toxic, NAD, awake, alert, responsive to questions Head: Normocephalic atraumatic, significant nasal congestion, oropharynx with erythema but no exudates, 1+ tonsillar hypertrophy bilaterally TMs and canals clear bilaterally Neck: No cervical lymphadenopathy present CV: Regular rate and rhythm no murmurs rubs or gallops Respiratory: Clear to ausculation bilaterally, no wheezes rales or crackles, chest rises symmetrically,  no increased work of breathing  ASSESSMENT/PLAN:   COVID-19 POC flu/COVID was positive for COVID infection.  Discussed symptomatic measures with patient with ibuprofen and Tylenol as needed can alternate every 3 hours for body aches.  Would recommend increasing hydration.  Does not meet high risk criteria for antiviral treatment. - fluticasone (FLONASE) 50 MCG/ACT nasal spray; Place 2 sprays into both nostrils daily.  Dispense: 11.1 mL; Refill: 0 -Symptomatic measures discussed with patient -Work note out for 5 days  Gerrit Heck, MD [place]

## 2022-08-30 NOTE — Patient Instructions (Signed)
It was great to see you! Thank you for allowing me to participate in your care!   Our plans for today:  - I am going to get a rapid covid/flu swab for you and call you with the results! - Your lung exam looks good - I am going to prescribe flonase to take as well to help with nasal congestions and post nasal drip  Take care and seek immediate care sooner if you develop any concerns.  Gerrit Heck, MD

## 2022-08-31 ENCOUNTER — Telehealth: Payer: Self-pay | Admitting: Podiatry

## 2022-08-31 NOTE — Progress Notes (Signed)
NURSES NOTE:   Pt arrived for her monthly Abilify Maintena 400 mg injection. Pt's medication is provided by a sample supply from the manufacturer. Last injection was 07/15/2022. Today she received injection IM in left upper gluteal area.  Tolerated well. Pt pleasant and in good spirits. No complaints or issues. Advised to schedule next injection in 28 days. Instructed to call if she is having any questions or concerns.      LOT [id number] EXP APR 2026

## 2022-08-31 NOTE — Telephone Encounter (Signed)
Pt states she is still in a lot of pain and is scheduled to see Dr Milinda Pointer on 4/11. Pt states she recently tested positive for Covid and she took the Celebrex but its not helping. Wants to know what else she can take to help with pain. She states her foot is a little swollen and experiencing sharp pain that is interrupting her sleep.  [facility] [id number] [place], [place]   Please advise.

## 2022-09-01 ENCOUNTER — Other Ambulatory Visit: Payer: Self-pay | Admitting: Podiatry

## 2022-09-01 MED ORDER — METHYLPREDNISOLONE 4 MG PO TBPK: ORAL_TABLET | ORAL | 0 refills | Status: DC

## 2022-09-05 ENCOUNTER — Encounter: Payer: Self-pay | Admitting: Obstetrics & Gynecology

## 2022-09-05 ENCOUNTER — Other Ambulatory Visit (HOSPITAL_COMMUNITY)
Admission: RE | Admit: 2022-09-05 | Discharge: 2022-09-05 | Disposition: A | Discharge status: Home / Self Care | Payer: Medicaid Other | Source: Ambulatory Visit | Attending: Obstetrics & Gynecology | Admitting: Obstetrics & Gynecology

## 2022-09-05 ENCOUNTER — Ambulatory Visit (INDEPENDENT_AMBULATORY_CARE_PROVIDER_SITE_OTHER): Payer: Medicaid Other | Admitting: Family Medicine

## 2022-09-05 ENCOUNTER — Ambulatory Visit (INDEPENDENT_AMBULATORY_CARE_PROVIDER_SITE_OTHER): Payer: Medicaid Other | Admitting: Obstetrics & Gynecology

## 2022-09-05 ENCOUNTER — Encounter: Payer: Self-pay | Admitting: Family Medicine

## 2022-09-05 VITALS — BP 136/90 | HR 91 | Ht 64.0 in | Wt 169.8 lb

## 2022-09-05 VITALS — BP 122/84 | HR 76 | Ht 64.0 in | Wt 168.0 lb

## 2022-09-05 DIAGNOSIS — Z0000 Encounter for general adult medical examination without abnormal findings: Secondary | ICD-10-CM | POA: Insufficient documentation

## 2022-09-05 DIAGNOSIS — T7491XA Unspecified adult maltreatment, confirmed, initial encounter: Secondary | ICD-10-CM | POA: Insufficient documentation

## 2022-09-05 DIAGNOSIS — N924 Excessive bleeding in the premenopausal period: Secondary | ICD-10-CM

## 2022-09-05 DIAGNOSIS — Z113 Encounter for screening for infections with a predominantly sexual mode of transmission: Secondary | ICD-10-CM | POA: Diagnosis present

## 2022-09-05 DIAGNOSIS — T7491XS Unspecified adult maltreatment, confirmed, sequela: Secondary | ICD-10-CM

## 2022-09-05 DIAGNOSIS — Z01419 Encounter for gynecological examination (general) (routine) without abnormal findings: Secondary | ICD-10-CM | POA: Diagnosis present

## 2022-09-05 DIAGNOSIS — D649 Anemia, unspecified: Secondary | ICD-10-CM

## 2022-09-05 DIAGNOSIS — Z1231 Encounter for screening mammogram for malignant neoplasm of breast: Secondary | ICD-10-CM | POA: Diagnosis not present

## 2022-09-05 DIAGNOSIS — Z1211 Encounter for screening for malignant neoplasm of colon: Secondary | ICD-10-CM | POA: Diagnosis not present
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
## 2022-09-05 NOTE — Assessment & Plan Note (Signed)
Will update CBC today.
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
## 2022-09-05 NOTE — Patient Instructions (Addendum)
It was wonderful to see you today. Thank you for allowing me to be a part of your care. Below is a short summary of what we discussed at your visit today:  Annual physical Today we got many labs to screen for cholesterol, diabetes, anemia, and other things. If the results are normal, I will send you a letter or MyChart message. If the results are abnormal, I will give you a call.    Cardiologist  You last saw [facility]. Radford Pax at [facility]. If you would like to return, give them a call.   Colonoscopy I have referred you to GI for colonoscopy.  Someone from their office should be calling you in 1 to 2 weeks to schedule an appointment.  If you do not hear from them, let us know. We may need to nudge along the referral.  Acid reflux You can also try Gaviscon over the counter. This is used after meals and forms a barrier to prevent acid from flowing up.   Please bring all of your medications to every appointment!  If you have any questions or concerns, please do not hesitate to contact us via phone or MyChart message.   [name], MD

## 2022-09-05 NOTE — Assessment & Plan Note (Addendum)
-   ref GI for colonoscopy - order mammogram, details provided - PAP today at GYN appt later - screening labs obtained, will follow
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
## 2022-09-06 ENCOUNTER — Telehealth: Payer: Self-pay | Admitting: Family Medicine

## 2022-09-06 DIAGNOSIS — R7401 Elevation of levels of liver transaminase levels: Secondary | ICD-10-CM

## 2022-09-06 LAB — COMPREHENSIVE METABOLIC PANEL
ALT: 48 IU/L — ABNORMAL HIGH (ref 0–32)
AST: 50 IU/L — ABNORMAL HIGH (ref 0–40)
Albumin/Globulin Ratio: 1.9 (ref 1.2–2.2)
Albumin: 4.2 g/dL (ref 3.9–4.9)
Alkaline Phosphatase: 83 IU/L (ref 44–121)
BUN/Creatinine Ratio: 19 (ref 9–23)
BUN: 11 mg/dL (ref 6–24)
Bilirubin Total: 0.2 mg/dL (ref 0.0–1.2)
CO2: 23 mmol/L (ref 20–29)
Calcium: 9 mg/dL (ref 8.7–10.2)
Chloride: 103 mmol/L (ref 96–106)
Creatinine, Ser: 0.57 mg/dL (ref 0.57–1.00)
Globulin, Total: 2.2 g/dL (ref 1.5–4.5)
Glucose: 102 mg/dL — ABNORMAL HIGH (ref 70–99)
Potassium: 3.6 mmol/L (ref 3.5–5.2)
Sodium: 140 mmol/L (ref 134–144)
Total Protein: 6.4 g/dL (ref 6.0–8.5)
eGFR: 111 mL/min/{1.73_m2} (ref >59)

## 2022-09-06 LAB — TSH RFX ON ABNORMAL TO FREE T4: TSH: 1.55 u[IU]/mL (ref 0.450–4.500)

## 2022-09-06 LAB — RPR+HBSAG+HCVAB+...
HIV Screen 4th Generation wRfx: NONREACTIVE
Hep C Virus Ab: NONREACTIVE
Hepatitis B Surface Ag: NEGATIVE
RPR Ser Ql: NONREACTIVE

## 2022-09-06 LAB — HEMOGLOBIN A1C
Est. average glucose Bld gHb Est-mCnc: 117 mg/dL
Hgb A1c MFr Bld: 5.7 % — ABNORMAL HIGH (ref 4.8–5.6)

## 2022-09-06 LAB — CBC
Hematocrit: 36.9 % (ref 34.0–46.6)
Hemoglobin: 12.2 g/dL (ref 11.1–15.9)
MCH: 27.9 pg (ref 26.6–33.0)
MCHC: 33.1 g/dL (ref 31.5–35.7)
MCV: 84 fL (ref 79–97)
Platelets: 289 10*3/uL (ref 150–450)
RBC: 4.37 x10E6/uL (ref 3.77–5.28)
RDW: 12.5 % (ref 11.7–15.4)
WBC: 6.1 10*3/uL (ref 3.4–10.8)

## 2022-09-06 LAB — LIPID PANEL
Chol/HDL Ratio: 3.8 ratio (ref 0.0–4.4)
Cholesterol, Total: 170 mg/dL (ref 100–199)
HDL: 45 mg/dL (ref >39)
LDL Chol Calc (NIH): 109 mg/dL — ABNORMAL HIGH (ref 0–99)
Triglycerides: 85 mg/dL (ref 0–149)
VLDL Cholesterol Cal: 16 mg/dL (ref 5–40)

## 2022-09-06 LAB — FOLLICLE STIMULATING HORMONE: FSH: 75.6 m[IU]/mL

## 2022-09-06 NOTE — Telephone Encounter (Signed)
Calling about labs.   Creatinine wnl, mildly elevated LFTs. LDL slightly above goal; ASCVD 3.2%, see calc below. No statin at this time. A1c just within pre-diabetes range. CBC w/o anemia. TSH wnl.   Hep B and C screens - negative  ETOH? - No ETOH in the last year Screen hemochromatosis (Fe/TIBC, (+) if >45%) Fatty liver? AST/ALT=1.04.    Reports "a lot of tylenol lately" - alternating w/ ibuprofen  Plan:  - add on Fe and TIBC to screen for hemochromatosis - reduce tylenol usage - recheck 6 months  [name], MD  The 10-year ASCVD risk score (Arnett DK, et al., 2019) is: 3.2%   Values used to calculate the score:     Age: 52 years     Sex: Female     Is Non-Hispanic African American: No     Diabetic: No     Tobacco smoker: Yes     Systolic Blood Pressure: 123XX123 mmHg     Is BP treated: No     HDL Cholesterol: 45 mg/dL     Total Cholesterol: 170 mg/dL

## 2022-09-08 ENCOUNTER — Encounter: Payer: Self-pay | Admitting: Podiatry

## 2022-09-08 ENCOUNTER — Ambulatory Visit (INDEPENDENT_AMBULATORY_CARE_PROVIDER_SITE_OTHER): Payer: Medicaid Other | Admitting: Podiatry

## 2022-09-08 DIAGNOSIS — S93691A Other sprain of right foot, initial encounter: Secondary | ICD-10-CM

## 2022-09-08 LAB — CYTOLOGY - PAP
Chlamydia: NEGATIVE
Comment: NEGATIVE
Comment: NEGATIVE
Comment: NEGATIVE
Comment: NORMAL
Diagnosis: NEGATIVE
High risk HPV: NEGATIVE
Neisseria Gonorrhea: NEGATIVE
Trichomonas: NEGATIVE

## 2022-09-09 ENCOUNTER — Ambulatory Visit (INDEPENDENT_AMBULATORY_CARE_PROVIDER_SITE_OTHER): Payer: Commercial Managed Care - HMO

## 2022-09-09 ENCOUNTER — Ambulatory Visit: Payer: Medicaid Other

## 2022-09-09 DIAGNOSIS — F319 Bipolar disorder, unspecified: Secondary | ICD-10-CM

## 2022-09-09 NOTE — Progress Notes (Signed)
She presents today for follow-up of her [place] fasciitis bilateral states that the left was really not doing too poorly however the right 1 is exquisitely tender.  She states that she could not take the Celebrex due to stomach upset but has been using Tylenol arthritis for the pain.  States that she was walking normally at work about a week ago feeling very good and then felt a pop in the right foot she states that it felt like something hit her in the bottom of the heel.  Objective: Vital signs are stable she alert oriented x 3 pulses are palpable.  There is no erythema edema cellulitis drainage or odor no ecchymosis is noted.  She does however demonstrate a void in the plantar fascia near its insertion site on the calcaneus right foot.  Exquisite tendon tenderness in this area.  Assessment: Probable rupture of the plantar fascia of the right foot.  Plan: Placed her in a cam boot I recommended that she continue the use of her Tylenol.  I would like to follow-up with her in about 6 weeks.

## 2022-09-09 NOTE — Progress Notes (Signed)
NURSES NOTE:   Pt arrived for her monthly Abilify Maintena 400 mg injection. Pt's medication is provided by a sample supply from the manufacturer. Last injection was 08/11/2022. Today she received injection IM in left upper gluteal area.  Tolerated well. Pt pleasant and in good spirits. No complaints or issues. Advised to schedule next injection in 28 days. Instructed to call if she is having any questions or concerns.      LOT [id number] EXP JUL 2026

## 2022-09-12 ENCOUNTER — Encounter: Payer: Self-pay | Admitting: *Deleted

## 2022-09-12 ENCOUNTER — Telehealth: Payer: Self-pay | Admitting: Podiatry

## 2022-09-12 NOTE — Telephone Encounter (Signed)
Patient came to [facility] office today stating she had gotten a boot in [place]. Patient states she is having issues with the boot and wants someone to have a look at it. Patient states she also needs a note to stay out of work and would like some kind of pain medication. Pt uses [facility]

## 2022-09-12 NOTE — Telephone Encounter (Signed)
Patient came to [facility] office today to have her cam boot looked at due to it rubbing on her leg. Patient asked while she was here if she can have a note for work this week and also some pain medication called to [facility]

## 2022-09-13 ENCOUNTER — Ambulatory Visit (HOSPITAL_COMMUNITY)
Admission: EM | Admit: 2022-09-13 | Discharge: 2022-09-13 | Disposition: A | Discharge status: Home / Self Care | Payer: Medicaid Other

## 2022-09-13 ENCOUNTER — Other Ambulatory Visit: Payer: Self-pay | Admitting: Podiatry

## 2022-09-13 ENCOUNTER — Encounter: Payer: Self-pay | Admitting: Podiatry

## 2022-09-13 ENCOUNTER — Ambulatory Visit (HOSPITAL_COMMUNITY)
Admission: EM | Admit: 2022-09-13 | Discharge: 2022-09-13 | Disposition: A | Discharge status: Home / Self Care | Payer: Medicaid Other | Attending: Psychiatry | Admitting: Psychiatry

## 2022-09-13 DIAGNOSIS — F17210 Nicotine dependence, cigarettes, uncomplicated: Secondary | ICD-10-CM | POA: Insufficient documentation

## 2022-09-13 DIAGNOSIS — F419 Anxiety disorder, unspecified: Secondary | ICD-10-CM | POA: Insufficient documentation

## 2022-09-13 DIAGNOSIS — Z760 Encounter for issue of repeat prescription: Secondary | ICD-10-CM | POA: Insufficient documentation

## 2022-09-13 DIAGNOSIS — F17290 Nicotine dependence, other tobacco product, uncomplicated: Secondary | ICD-10-CM | POA: Insufficient documentation

## 2022-09-13 DIAGNOSIS — F339 Major depressive disorder, recurrent, unspecified: Secondary | ICD-10-CM | POA: Insufficient documentation

## 2022-09-13 MED ORDER — HYDROCODONE-ACETAMINOPHEN 10-325 MG PO TABS: 1.0000 | ORAL_TABLET | Freq: Four times a day (QID) | ORAL | 0 refills | Status: AC | PRN

## 2022-09-13 MED ORDER — HYDROXYZINE HCL 25 MG PO TABS
50.0000 mg | ORAL_TABLET | ORAL | Status: AC
  Administered 2022-09-13: 50 mg via ORAL
  Filled 2022-09-13: qty 2

## 2022-09-13 NOTE — Discharge Instructions (Signed)
F/u with pharmacy for medication refil.

## 2022-09-13 NOTE — Telephone Encounter (Signed)
Called pt LM on VM let her know note was done and medication was sent in to pharmacy.

## 2022-09-13 NOTE — Telephone Encounter (Signed)
Delydia helped her with her boot. I did not give her a note or medication. I can write her a note and let her know its ready.

## 2022-09-13 NOTE — Progress Notes (Signed)
   09/13/22 2216  [facility] Triage Screening (Walk-ins at [facility] only)  How Did You Hear About [place]? Self  What Is the Reason for Your Visit/Call Today? Pt presents to [facility] voluntarily, unaccompanied at this time with complaint of depression and anxiety. Pt is linked to [facility] and has upcoming appointment for 09/23/22. Pt reports having Abilify shot last week. Pt denies SI, HI, AVH and substance/alcohol use.  How Long Has This Been Causing You Problems? <Week  Have You Recently Had Any Thoughts About Hurting Yourself? No  Are You Planning to Commit Suicide/Harm Yourself At This time? No  Have you Recently Had Thoughts About Hurting Someone [name]? No  Are You Planning To Harm Someone At This Time? No  Are you currently experiencing any auditory, visual or other hallucinations? No  Have You Used Any Alcohol or Drugs in the Past 24 Hours? No  Do you have any current medical co-morbidities that require immediate attention? No  Clinician description of patient physical appearance/behavior: Pt is cooperative  What Do You Feel Would Help You the Most Today? Treatment for Depression or other mood problem;Stress Management  If access to [facility] was not available, would you have sought care in the Emergency Department? No  Determination of Need Routine (7 days)  Options For Referral Other: Comment;Outpatient Therapy;Medication Management

## 2022-09-13 NOTE — ED Provider Notes (Signed)
Behavioral Health Urgent Care Medical Screening Exam  Patient Name: Kelly Carr MRN: [id number] Date of Evaluation: 09/14/22 Chief Complaint:  need something for depression  Diagnosis:  Final diagnoses:  Encounter for medication refill  Recurrent major depressive disorder, remission status unspecified    History of Present illness: Kelly Carr is a 52 y.o. adult. With a history of bipolar disorder, depression, and anxiety presented to [facility] voluntarily.  According to the patient she just need some medication for her anxiety.  According to the patient she currently takes Abilify 400 mg injectable and she got her shot last week however she needed something for anxiety according to the patient her medicine was sent to the pharmacy but her pharmacy is closed at this time.  Patient does seem to be a little bit anxious however she is cooperative.  Face-to-face observation of patient patient is alert and oriented x 4, speech is clear, maintaining eye contact.  Patient does appear to be a little bit anxious.  Patient can be blunted at times.  Patient denies SI, HI, AVH or paranoia.  Patient denies alcohol use at this time stating that she last drink a year ago.  Patient reports she Vapes and smokes cigarettes.  According to patient she states in the elements, the area.  That her pharmacy is closed.  Discussed with patient that we can give her a dose of hydroxyzine however she need to follow-up with her pharmacy tomorrow to get her medication.  Will give patient 1 dose of hydroxyzine 50 mg  Meds ordered this encounter  Medications   hydrOXYzine (ATARAX) tablet 50 mg     Recommend discharge the patient to follow-up with getting her medicines filled at the pharmacy  Flowsheet Row ED from 09/13/2022 in [facility] ED from 03/18/2022 in [facility] from 04/06/2021 in [facility]  C-SSRS RISK CATEGORY No Risk  No Risk No Risk       Psychiatric Specialty Exam  Presentation  General Appearance:Casual  Eye Contact:Good  Speech:Clear and Coherent  Speech Volume:Normal  Handedness:Right   Mood and Affect  Mood: Anxious  Affect: Blunt   Thought Process  Thought Processes: Coherent  Descriptions of Associations:Circumstantial  Orientation:Full (Time, Place and Person)  Thought Content:WDL  Diagnosis of Schizophrenia or Schizoaffective disorder in past: No data recorded Duration of Psychotic Symptoms: No data recorded Hallucinations:None  Ideas of Reference:None  Suicidal Thoughts:No  Homicidal Thoughts:No   Sensorium  Memory: Immediate Fair  Judgment: Fair  Insight: Good   Executive Functions  Concentration: Good  Attention Span: Good  Recall: Good  Fund of Knowledge: Good  Language: Good   Psychomotor Activity  Psychomotor Activity: Normal   Assets  Assets: Communication Skills   Sleep  Sleep: Fair  Number of hours: No data recorded  Physical Exam: Physical Exam HENT:     Head: Normocephalic.     Nose: Nose normal.  Cardiovascular:     Rate and Rhythm: Normal rate.  Pulmonary:     Effort: Pulmonary effort is normal.  Musculoskeletal:        General: Normal range of motion.     Cervical back: Normal range of motion.  Neurological:     General: No focal deficit present.     Mental Status: She is alert.  Psychiatric:        Mood and Affect: Mood normal.        Thought Content: Thought content normal.  Judgment: Judgment normal.    Review of Systems  Constitutional: Negative.   HENT: Negative.    Eyes: Negative.   Respiratory: Negative.    Cardiovascular: Negative.   Gastrointestinal: Negative.   Genitourinary: Negative.   Musculoskeletal: Negative.   Skin: Negative.   Neurological: Negative.   Psychiatric/Behavioral:  Positive for depression. The patient is nervous/anxious.    There were no vitals taken  for this visit. There is no height or weight on file to calculate BMI.  Musculoskeletal: Strength & Muscle Tone: within normal limits Gait & Station: normal Patient leans: N/A   [facility] MSE Discharge Disposition for Follow up and Recommendations: Based on my evaluation the patient does not appear to have an emergency medical condition and can be discharged with resources and follow up care in outpatient services for Medication Management   Sindy Guadeloupe, NP 09/14/2022, 5:48 AM

## 2022-09-15 ENCOUNTER — Other Ambulatory Visit: Payer: Self-pay

## 2022-09-15 ENCOUNTER — Ambulatory Visit (INDEPENDENT_AMBULATORY_CARE_PROVIDER_SITE_OTHER): Payer: Medicaid Other | Admitting: Family Medicine

## 2022-09-15 ENCOUNTER — Emergency Department (HOSPITAL_COMMUNITY)
Admission: EM | Admit: 2022-09-15 | Discharge: 2022-09-16 | Disposition: A | Discharge status: Home / Self Care | Payer: Medicaid Other | Attending: Emergency Medicine | Admitting: Emergency Medicine

## 2022-09-15 ENCOUNTER — Ambulatory Visit: Payer: Commercial Managed Care - HMO | Admitting: Podiatry

## 2022-09-15 ENCOUNTER — Encounter (HOSPITAL_COMMUNITY): Payer: Self-pay

## 2022-09-15 VITALS — BP 113/82 | HR 106 | Ht 64.0 in | Wt 169.2 lb

## 2022-09-15 DIAGNOSIS — R21 Rash and other nonspecific skin eruption: Secondary | ICD-10-CM | POA: Diagnosis not present

## 2022-09-15 DIAGNOSIS — Z91038 Other insect allergy status: Secondary | ICD-10-CM

## 2022-09-15 DIAGNOSIS — W57XXXA Bitten or stung by nonvenomous insect and other nonvenomous arthropods, initial encounter: Secondary | ICD-10-CM | POA: Insufficient documentation

## 2022-09-15 DIAGNOSIS — F32A Depression, unspecified: Secondary | ICD-10-CM | POA: Diagnosis present

## 2022-09-15 DIAGNOSIS — Z79899 Other long term (current) drug therapy: Secondary | ICD-10-CM | POA: Diagnosis not present

## 2022-09-15 DIAGNOSIS — F419 Anxiety disorder, unspecified: Secondary | ICD-10-CM | POA: Diagnosis not present

## 2022-09-15 LAB — CBC WITH DIFFERENTIAL/PLATELET
Abs Immature Granulocytes: 0.03 10*3/uL (ref 0.00–0.07)
Basophils Absolute: 0 10*3/uL (ref 0.0–0.1)
Basophils Relative: 0 %
Eosinophils Absolute: 0.7 10*3/uL — ABNORMAL HIGH (ref 0.0–0.5)
Eosinophils Relative: 6 %
HCT: 39.6 % (ref 36.0–46.0)
Hemoglobin: 13 g/dL (ref 12.0–15.0)
Immature Granulocytes: 0 %
Lymphocytes Relative: 26 %
Lymphs Abs: 2.9 10*3/uL (ref 0.7–4.0)
MCH: 28.3 pg (ref 26.0–34.0)
MCHC: 32.8 g/dL (ref 30.0–36.0)
MCV: 86.1 fL (ref 80.0–100.0)
Monocytes Absolute: 0.9 10*3/uL (ref 0.1–1.0)
Monocytes Relative: 8 %
Neutro Abs: 6.9 10*3/uL (ref 1.7–7.7)
Neutrophils Relative %: 60 %
Platelets: 417 10*3/uL — ABNORMAL HIGH (ref 150–400)
RBC: 4.6 MIL/uL (ref 3.87–5.11)
RDW: 13.3 % (ref 11.5–15.5)
WBC: 11.5 10*3/uL — ABNORMAL HIGH (ref 4.0–10.5)
nRBC: 0 % (ref 0.0–0.2)

## 2022-09-15 LAB — RAPID URINE DRUG SCREEN, HOSP PERFORMED
Amphetamines: NOT DETECTED
Barbiturates: NOT DETECTED
Benzodiazepines: NOT DETECTED
Cocaine: POSITIVE — AB
Opiates: NOT DETECTED
Tetrahydrocannabinol: POSITIVE — AB

## 2022-09-15 LAB — COMPREHENSIVE METABOLIC PANEL
ALT: 53 U/L — ABNORMAL HIGH (ref 0–44)
AST: 43 U/L — ABNORMAL HIGH (ref 15–41)
Albumin: 3.7 g/dL (ref 3.5–5.0)
Alkaline Phosphatase: 67 U/L (ref 38–126)
Anion gap: 9 (ref 5–15)
BUN: 9 mg/dL (ref 6–20)
CO2: 23 mmol/L (ref 22–32)
Calcium: 9.1 mg/dL (ref 8.9–10.3)
Chloride: 105 mmol/L (ref 98–111)
Creatinine, Ser: 0.66 mg/dL (ref 0.44–1.00)
GFR, Estimated: >60 mL/min (ref >60)
Glucose, Bld: 103 mg/dL — ABNORMAL HIGH (ref 70–99)
Potassium: 4.1 mmol/L (ref 3.5–5.1)
Sodium: 137 mmol/L (ref 135–145)
Total Bilirubin: 0.7 mg/dL (ref 0.3–1.2)
Total Protein: 6.6 g/dL (ref 6.5–8.1)

## 2022-09-15 LAB — ETHANOL: Alcohol, Ethyl (B): <10 mg/dL (ref <10)

## 2022-09-15 LAB — I-STAT BETA HCG BLOOD, ED (MC, WL, AP ONLY): I-stat hCG, quantitative: <5 m[IU]/mL (ref <5)

## 2022-09-15 MED ORDER — CETIRIZINE HCL 10 MG PO TABS: 10.0000 mg | ORAL_TABLET | Freq: Every day | ORAL | 0 refills | Status: DC

## 2022-09-15 MED ORDER — ACETAMINOPHEN 500 MG PO TABS
1000.0000 mg | ORAL_TABLET | Freq: Once | ORAL | Status: AC
  Administered 2022-09-15: 1000 mg via ORAL
  Filled 2022-09-15: qty 2

## 2022-09-15 MED ORDER — DIPHENHYDRAMINE HCL 25 MG PO CAPS: 25.0000 mg | ORAL_CAPSULE | Freq: Four times a day (QID) | ORAL | 0 refills | Status: DC | PRN

## 2022-09-15 NOTE — Progress Notes (Signed)
    SUBJECTIVE:   CHIEF COMPLAINT / HPI:   Patient presents for possible allergic reaction. Noticed the bites yesterday. Thinks it was bug bites and had someone she hung out with yesterday who she says she may have got it from. States she is highly allergic to bug bites. Has happened before. No respiratory difficulties. Has not taken any medication for it.  She does state she Is allergic to prednisone.   PERTINENT  PMH / PSH: Reviewed   OBJECTIVE:   BP 113/82   Pulse (!) 106   Ht 5\' 4"  (1.626 m)   Wt 169 lb 3.2 oz (76.7 kg)   SpO2 97%   BMI 29.04 kg/m    Physical exam General: well appearing, NAD Cardiovascular: RRR, no murmurs Lungs: CTAB. Normal WOB Abdomen: soft, non-distended, non-tender Skin: warm, dry. Several pruritic papules and pustules over body with surrounding erythema       ASSESSMENT/PLAN:   Rash Patient with Several pruritic papules and pustules over body with surrounding erythema likely due to a bug bite which she has had reactions to in the past. Reassuringly vitals stable and without any respiratory difficulties. Given she drove herself to clinic today did not administer Benadryl but did send a Rx for it. Also recommended Zyrtec daily to help with the itching. Given allergic reaction to Prednisone did not prescribe that at this time. Strict ED precautions discussed.       Cora Collum, DO [facility]

## 2022-09-15 NOTE — ED Provider Triage Note (Signed)
Emergency Medicine Provider Triage Evaluation Note  Kelly Carr , a 52 y.o. adult  was evaluated in triage.  Pt complains of depression. Report increased depression and anxiety for the past week due to family stress.  Report hx of mental illness.  Denies SI/HI/AVH.  No recent medication changes  Review of Systems  Positive: As above Negative: As above  Physical Exam  BP (!) 140/87 (BP Location: Right Arm)   Pulse (!) 110   Temp 98.7 F (37.1 C) (Oral)   Resp 17   SpO2 96%  Gen:   Awake, no distress   Resp:  Normal effort  MSK:   Moves extremities without difficulty  Other:    Medical Decision Making  Medically screening exam initiated at 10:09 PM.  Appropriate orders placed.  Kelly Carr was informed that the remainder of the evaluation will be completed by another provider, this initial triage assessment does not replace that evaluation, and the importance of remaining in the ED until their evaluation is complete.     Fayrene Helper, PA-C 09/15/22 2209
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
## 2022-09-15 NOTE — Assessment & Plan Note (Signed)
Patient with Several pruritic papules and pustules over body with surrounding erythema likely due to a bug bite which she has had reactions to in the past. Reassuringly vitals stable and without any respiratory difficulties. Given she drove herself to clinic today did not administer Benadryl but did send a Rx for it. Also recommended Zyrtec daily to help with the itching. Given allergic reaction to Prednisone did not prescribe that at this time. Strict ED precautions discussed.

## 2022-09-15 NOTE — Patient Instructions (Signed)
It was great seeing you today!  For your allergic reaction I have sent in Benadryl and Zyrtec. As we discussed if it worsens, your have trouble breathing, or any new and worsening symptoms please go to the ED.   Feel free to call with any questions or concerns at any time, at [phone].   Take care,  Dr. Cora Collum [facility]

## 2022-09-16 ENCOUNTER — Other Ambulatory Visit: Payer: Self-pay

## 2022-09-16 NOTE — ED Notes (Signed)
Patient verbalizes understanding of discharge instructions. Opportunity for questioning and answers were provided. Armband removed by staff, pt discharged from ED. Pt ambulatory to ED waiting room with steady gait.  

## 2022-09-16 NOTE — ED Notes (Signed)
Pt stating that she would like to leave, states "I got my shot this morning, I have my meds at home. I walked in here". PA made aware, to bedside to see pt.
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
## 2022-09-18 ENCOUNTER — Ambulatory Visit (HOSPITAL_COMMUNITY)
Admission: EM | Admit: 2022-09-18 | Discharge: 2022-09-19 | Disposition: A | Discharge status: Home / Self Care | Payer: Medicaid Other | Attending: Nurse Practitioner | Admitting: Nurse Practitioner

## 2022-09-18 DIAGNOSIS — F319 Bipolar disorder, unspecified: Secondary | ICD-10-CM | POA: Insufficient documentation

## 2022-09-18 DIAGNOSIS — F4310 Post-traumatic stress disorder, unspecified: Secondary | ICD-10-CM | POA: Insufficient documentation

## 2022-09-18 DIAGNOSIS — F411 Generalized anxiety disorder: Secondary | ICD-10-CM | POA: Insufficient documentation

## 2022-09-18 NOTE — Progress Notes (Signed)
   09/18/22 2313  [facility] Triage Screening (Walk-ins at [facility] only)  How Did You Hear About [place]? Family/Friend  What Is the Reason for Your Visit/Call Today? Pt presents to [facility] voluntarily, accompanied by her brother requesting outpatient therapy. Pt complains of anxiety sometimes but, states that she is under a lot of stress and is dealing with issues including a tendon partial rupture in her foot. Pt states " I do not need to be here". Pt indicated that she is following up with next appointment with her psychiatrist at [facility] on 09/23/22. Pt denies SI, HI, AVH.  How Long Has This Been Causing You Problems? <Week  Have You Recently Had Any Thoughts About Hurting Yourself? No  Are You Planning to Commit Suicide/Harm Yourself At This time? No  Have you Recently Had Thoughts About Hurting Someone [name]? No  Are You Planning To Harm Someone At This Time? No  Are you currently experiencing any auditory, visual or other hallucinations? No  Have You Used Any Alcohol or Drugs in the Past 24 Hours? Yes  How long ago did you use Drugs or Alcohol? reports drinking alcohol (beer)  Do you have any current medical co-morbidities that require immediate attention? No  Clinician description of patient physical appearance/behavior: Pt is calm, cooperative, oriented  What Do You Feel Would Help You the Most Today? Treatment for Depression or other mood problem;Stress Management  If access to [facility] was not available, would you have sought care in the Emergency Department? No  Determination of Need Routine (7 days)  Options For Referral Outpatient Therapy;Medication Management

## 2022-09-19 ENCOUNTER — Encounter: Payer: Self-pay | Admitting: *Deleted

## 2022-09-19 NOTE — Discharge Instructions (Signed)
  Discharge recommendations:  Patient is to take medications as prescribed. Please see information for follow-up appointment with psychiatry and therapy. Please follow up with your primary care provider for all medical related needs.  Please follow up with outpatient provider Melony Overly for ongoing medication management on 09/23/22.  Therapy: We recommend that patient participate in individual therapy to address mental health concerns.  Medications: The patient or guardian is to contact a medical professional and/or outpatient provider to address any new side effects that develop. The patient or guardian should update outpatient providers of any new medications and/or medication changes.   Atypical antipsychotics: If you are prescribed an atypical antipsychotic, it is recommended that your height, weight, BMI, blood pressure, fasting lipid panel, and fasting blood sugar be monitored by your outpatient providers.  Safety:  The patient should abstain from use of illicit substances/drugs and abuse of any medications. If symptoms worsen or do not continue to improve or if the patient becomes actively suicidal or homicidal then it is recommended that the patient return to the closest hospital emergency department, the [facility], or call 911 for further evaluation and treatment. National Suicide Prevention Lifeline 1-800-SUICIDE or (317)777-8118.  About 988 988 offers 24/7 access to trained crisis counselors who can help people experiencing mental health-related distress. People can call or text 988 or chat 988lifeline.org for themselves or if they are worried about a loved one who may need crisis support.  Crisis Mobile: [facility]:                     845-394-1365 (for crisis response 24 hours a day) [facility] Hotline:                                            628-654-7173

## 2022-09-19 NOTE — ED Provider Notes (Signed)
[facility] Medical Screening Exam  Patient Name: Kelly Carr MRN: [id number] Date of Evaluation: 09/19/22 Chief Complaint:  I feel stressed out  Diagnosis:  Final diagnoses:  Bipolar I disorder    History of Present illness: Kelly Carr is a 52 y.o. adult female with a history of bipolar disorder, generalized anxiety disorder, and PTSD presenting to [facility] accompanied by her brother Kelly Carr. Patient reports that she has been having a lot of stress, anxiety and feeling overwhelmed.  Patient reports her stressors are related to working 2 jobs at [employer] and [employer]. Patient reports that recently quit one of her jobs and she is on leave from the other due to a tendon injury to her foot. Patient reports that her stressors are family members. Patient reports that she has been arguing with her daughter a lot with whom she lives. Patient reports that her daughter has been staying with her uncle because of the tension and arguing. Patient reports that younger brother passed away two years ago and three weeks after his death another brother passed away and she is still grieving their deaths. Patient states that her mother has been diagnosed with dementia and her mother was her best friend and she feels she does not have that emotional support from anyone else anymore. Patient reports that brother Kelly Carr has been assisting her financially, he signed for her apartment and bought her car.     Patient's brother reports that patient has not been sleeping and that she has been driving around late at night, she impulsively quit her job and has been arguing a lot with her daughter and son. Brother reports that patient has been making poor and erratic decisions, leaving her car at friend's house in [place]. Patient states that she drives around at night sometimes after arguing with her daughter and that she will be picking her car up from her friend's house tomorrow.   Nurse  practitioner assessed patient face-to-face and reviewed the chart.  Patient is alert oriented x 4, speech is clear and coherent but pressured with grandiosity. Mood is anxious with congruent affect.  Patient is being followed by Kelly Carr for medication management. Patient reports that she received her abilify maintena 400 mg injection earlier this month. Patient reports that she has an appointment with Kelly Carr on Kelly Carr, Kelly Carr. Patient denies using any alcohol or illicit substances and does not appear to be impaired. Patient denies any SI/HI or AVH. Patient does not appear to be responding to any internal or external stimuli.  Patient does not appear to be in imminent danger to herself or others and does not wish to come in for inpatient treatment. Patient states that she wants to start outpatient therapy.  Patient given information for [facility] outpatient walk-in clinic hours to get established with outpatient therapy.      Flowsheet Row ED from 4/14/Kelly Carr in [facility] ED from 4/11/Kelly Carr in [facility] Emergency Department at [facility] ED from 4/9/Kelly Carr in [facility]  C-SSRS RISK CATEGORY No Risk No Risk No Risk       Psychiatric Specialty Exam  Presentation  General Appearance:Casual  Eye Contact:Good  Speech:Clear and Coherent  Speech Volume:Increased  Handedness:Right   Mood and Affect  Mood: Anxious; Irritable  Affect: Blunt   Thought Process  Thought Processes: Coherent  Descriptions of Associations:Circumstantial  Orientation:Full (Time, Place and Person)  Thought Content:WDL  Diagnosis of Schizophrenia or Schizoaffective disorder  in past: No data recorded Duration of Psychotic Symptoms: No data recorded Hallucinations:None  Ideas of Reference:None  Suicidal Thoughts:No  Homicidal Thoughts:No   Sensorium  Memory: Immediate Fair; Recent Fair; Remote  Fair  Judgment: Fair  Insight: Fair   Art therapist  Concentration: Fair  Attention Span: Fair  Recall: [employer] of Knowledge: Fair  Language: Fair   Psychomotor Activity  Psychomotor Activity: Normal   Assets  Assets: Manufacturing systems engineer; Housing; Research scientist (medical); Resilience   Sleep  Sleep: Poor  Number of hours:  -1   Physical Exam: Physical Exam Constitutional:      Appearance: Normal appearance.  HENT:     Head: Normocephalic and atraumatic.     Nose: Nose normal.  Eyes:     Pupils: Pupils are equal, round, and reactive to light.  Cardiovascular:     Rate and Rhythm: Normal rate.  Pulmonary:     Effort: Pulmonary effort is normal.  Abdominal:     General: Abdomen is flat.  Musculoskeletal:        General: Normal range of motion.     Cervical back: Normal range of motion.  Skin:    General: Skin is warm.  Neurological:     Mental Status: She is alert and oriented to person, place, and time.  Psychiatric:        Attention and Perception: Attention normal.        Mood and Affect: Mood normal.        Speech: Speech normal.        Behavior: Behavior is cooperative.        Cognition and Memory: Cognition normal.        Judgment: Judgment is impulsive.    Review of Systems  Constitutional: Negative.   HENT: Negative.    Eyes: Negative.   Respiratory: Negative.    Cardiovascular: Negative.   Gastrointestinal: Negative.   Genitourinary: Negative.   Musculoskeletal: Negative.   Skin: Negative.   Neurological: Negative.   Endo/Heme/Allergies: Negative.   Psychiatric/Behavioral:  Positive for depression. The patient is nervous/anxious.    Blood pressure (!) 141/96, pulse (!) 111, temperature 98.4 F (36.9 C), temperature source Oral, resp. rate 18, SpO2 99 %. There is no height or weight on file to calculate BMI.  Musculoskeletal: Strength & Muscle Tone: within normal limits Gait & Station: normal Patient leans:  N/A   [facility] MSE Discharge Disposition for Follow up and Recommendations: Based on my evaluation the patient does not appear to have an emergency medical condition and can be discharged with resources and follow up care in outpatient services for Medication Management and Individual Therapy. Patient will follow up with Ernst Breach for ongoing outpatient medication management on Dudley Carr, Kelly Carr.    Jasper Riling, NP 4/15/Kelly Carr, 5:56 AM

## 2022-09-20 ENCOUNTER — Emergency Department
Admission: EM | Admit: 2022-09-20 | Discharge: 2022-09-21 | Disposition: A | Discharge status: Home / Self Care | Payer: Medicaid Other | Attending: Emergency Medicine | Admitting: Emergency Medicine

## 2022-09-20 DIAGNOSIS — Z79899 Other long term (current) drug therapy: Secondary | ICD-10-CM | POA: Insufficient documentation

## 2022-09-20 DIAGNOSIS — T7611XA Adult physical abuse, suspected, initial encounter: Secondary | ICD-10-CM | POA: Diagnosis not present

## 2022-09-20 DIAGNOSIS — F312 Bipolar disorder, current episode manic severe with psychotic features: Secondary | ICD-10-CM | POA: Diagnosis not present

## 2022-09-20 DIAGNOSIS — F332 Major depressive disorder, recurrent severe without psychotic features: Secondary | ICD-10-CM | POA: Diagnosis present

## 2022-09-20 DIAGNOSIS — Z1152 Encounter for screening for COVID-19: Secondary | ICD-10-CM | POA: Insufficient documentation

## 2022-09-20 DIAGNOSIS — Z9151 Personal history of suicidal behavior: Secondary | ICD-10-CM | POA: Diagnosis not present

## 2022-09-20 DIAGNOSIS — F29 Unspecified psychosis not due to a substance or known physiological condition: Secondary | ICD-10-CM | POA: Diagnosis not present

## 2022-09-20 DIAGNOSIS — F411 Generalized anxiety disorder: Secondary | ICD-10-CM | POA: Insufficient documentation

## 2022-09-20 DIAGNOSIS — F4310 Post-traumatic stress disorder, unspecified: Secondary | ICD-10-CM | POA: Insufficient documentation

## 2022-09-20 DIAGNOSIS — F22 Delusional disorders: Secondary | ICD-10-CM | POA: Diagnosis not present

## 2022-09-20 DIAGNOSIS — T7491XA Unspecified adult maltreatment, confirmed, initial encounter: Secondary | ICD-10-CM | POA: Diagnosis present

## 2022-09-20 LAB — SARS CORONAVIRUS 2 BY RT PCR: SARS Coronavirus 2 by RT PCR: NEGATIVE

## 2022-09-20 LAB — CBC
HCT: 36.5 % (ref 36.0–46.0)
Hemoglobin: 11.7 g/dL — ABNORMAL LOW (ref 12.0–15.0)
MCH: 28.5 pg (ref 26.0–34.0)
MCHC: 32.1 g/dL (ref 30.0–36.0)
MCV: 89 fL (ref 80.0–100.0)
Platelets: 350 10*3/uL (ref 150–400)
RBC: 4.1 MIL/uL (ref 3.87–5.11)
RDW: 13.2 % (ref 11.5–15.5)
WBC: 9 10*3/uL (ref 4.0–10.5)
nRBC: 0 % (ref 0.0–0.2)

## 2022-09-20 LAB — COMPREHENSIVE METABOLIC PANEL
ALT: 58 U/L — ABNORMAL HIGH (ref 0–44)
AST: 64 U/L — ABNORMAL HIGH (ref 15–41)
Albumin: 3.5 g/dL (ref 3.5–5.0)
Alkaline Phosphatase: 54 U/L (ref 38–126)
Anion gap: 9 (ref 5–15)
BUN: 11 mg/dL (ref 6–20)
CO2: 23 mmol/L (ref 22–32)
Calcium: 8.9 mg/dL (ref 8.9–10.3)
Chloride: 105 mmol/L (ref 98–111)
Creatinine, Ser: 0.65 mg/dL (ref 0.44–1.00)
GFR, Estimated: >60 mL/min (ref >60)
Glucose, Bld: 122 mg/dL — ABNORMAL HIGH (ref 70–99)
Potassium: 3.5 mmol/L (ref 3.5–5.1)
Sodium: 137 mmol/L (ref 135–145)
Total Bilirubin: 0.9 mg/dL (ref 0.3–1.2)
Total Protein: 6.2 g/dL — ABNORMAL LOW (ref 6.5–8.1)

## 2022-09-20 LAB — ACETAMINOPHEN LEVEL: Acetaminophen (Tylenol), Serum: <10 ug/mL — ABNORMAL LOW (ref 10–30)

## 2022-09-20 LAB — SALICYLATE LEVEL: Salicylate Lvl: <7 mg/dL — ABNORMAL LOW (ref 7.0–30.0)

## 2022-09-20 LAB — ETHANOL: Alcohol, Ethyl (B): <10 mg/dL (ref <10)

## 2022-09-20 MED ORDER — ONDANSETRON HCL 4 MG PO TABS: 4.0000 mg | ORAL_TABLET | Freq: Three times a day (TID) | ORAL | Status: DC | PRN

## 2022-09-20 MED ORDER — ALUM & MAG HYDROXIDE-SIMETH 200-200-20 MG/5ML PO SUSP: 30.0000 mL | Freq: Four times a day (QID) | ORAL | Status: DC | PRN
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
## 2022-09-20 NOTE — ED Notes (Addendum)
Pt belongings:  Black medical boot White sock Green slipper [employer] Blue/grey pants Yellow earrings Yellow necklace Grey watch Grey hair tie
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
## 2022-09-20 NOTE — ED Provider Notes (Signed)
[facility] Provider Note    Event Date/Time   First MD Initiated Contact with Patient 09/20/22 2102     (approximate)   History   Chief Complaint: Psychiatric Evaluation   HPI  Dema Faeth is a 52 y.o. adult with a history of bipolar disorder who was brought to the ED under involuntary commitment due to psychosis.  Patient is having delusion of being engaged to [employer], the musician.  She reports that she and Rihanna are best friends, and she (the patient) is releasing and albums soon.  Patient denies SI or HI or pain or other acute medical complaints.     Physical Exam   Triage Vital Signs: ED Triage Vitals  Enc Vitals Group     BP 09/20/22 2033 (!) 141/107     Pulse Rate 09/20/22 2033 93     Resp 09/20/22 2033 16     Temp 09/20/22 2033 97.9 F (36.6 C)     Temp Source 09/20/22 2033 Oral     SpO2 09/20/22 2033 100 %     Weight 09/20/22 2038 165 lb (74.8 kg)     Height 09/20/22 2038  (1.626 m)     Head Circumference --      Peak Flow --      Pain Score 09/20/22 2038 8     Pain Loc --      Pain Edu? --      Excl. in GC? --     Most recent vital signs: Vitals:   09/20/22 2033  BP: (!) 141/107  Pulse: 93  Resp: 16  Temp: 97.9 F (36.6 C)  SpO2: 100%    General: Awake, no distress. CV:  Good peripheral perfusion.  Resp:  Normal effort.  Abd:  No distention.  Other:  No wounds   ED Results / Procedures / Treatments   Labs (all labs ordered are listed, but only abnormal results are displayed) Labs Reviewed  COMPREHENSIVE METABOLIC PANEL  ETHANOL  SALICYLATE LEVEL  ACETAMINOPHEN LEVEL  CBC  URINE DRUG SCREEN, QUALITATIVE ([facility] ONLY)  POC URINE PREG, ED     EKG    RADIOLOGY    PROCEDURES:  Procedures   MEDICATIONS ORDERED IN ED: Medications  ondansetron (ZOFRAN) tablet 4 mg (has no administration in time range)  alum & mag hydroxide-simeth (MAALOX/MYLANTA) 200-200-20 MG/5ML suspension 30 mL (has  no administration in time range)     IMPRESSION / MDM / ASSESSMENT AND PLAN / ED COURSE  I reviewed the triage vital signs and the nursing notes.  Patient's presentation is most consistent with acute presentation with potential threat to life or bodily function.  Patient presents with symptoms of mania.  Will maintain IVC and consult psychiatry.  She is medically stable  The patient has been placed in psychiatric observation due to the need to provide a safe environment for the patient while obtaining psychiatric consultation and evaluation, as well as ongoing medical and medication management to treat the patient's condition.  The patient has been placed under full IVC at this time.   ----------------------------------------- 10:23 PM on 09/20/2022 ----------------------------------------- Psychiatry recommends inpatient treatment.       FINAL CLINICAL IMPRESSION(S) / ED DIAGNOSES   Final diagnoses:  Delusions     Rx / DC Orders   ED Discharge Orders     None        Note:  This document was prepared using Dragon voice recognition software and may include unintentional dictation errors.  Sharman Cheek, MD 09/20/22 2224
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
## 2022-09-21 ENCOUNTER — Other Ambulatory Visit: Payer: Self-pay | Admitting: Student

## 2022-09-21 ENCOUNTER — Inpatient Hospital Stay (HOSPITAL_COMMUNITY)
Admission: AD | Admit: 2022-09-21 | Discharge: 2022-09-29 | DRG: 885 | Disposition: A | Discharge status: Home / Self Care | Payer: Medicaid Other | Source: Intra-hospital | Attending: Psychiatry | Admitting: Psychiatry

## 2022-09-21 ENCOUNTER — Other Ambulatory Visit: Payer: Self-pay

## 2022-09-21 ENCOUNTER — Encounter (HOSPITAL_COMMUNITY): Payer: Self-pay | Admitting: Psychiatry

## 2022-09-21 DIAGNOSIS — Z818 Family history of other mental and behavioral disorders: Secondary | ICD-10-CM | POA: Diagnosis not present

## 2022-09-21 DIAGNOSIS — F17210 Nicotine dependence, cigarettes, uncomplicated: Secondary | ICD-10-CM | POA: Diagnosis present

## 2022-09-21 DIAGNOSIS — F17290 Nicotine dependence, other tobacco product, uncomplicated: Secondary | ICD-10-CM | POA: Diagnosis present

## 2022-09-21 DIAGNOSIS — J45909 Unspecified asthma, uncomplicated: Secondary | ICD-10-CM | POA: Diagnosis present

## 2022-09-21 DIAGNOSIS — Z8249 Family history of ischemic heart disease and other diseases of the circulatory system: Secondary | ICD-10-CM

## 2022-09-21 DIAGNOSIS — Z888 Allergy status to other drugs, medicaments and biological substances status: Secondary | ICD-10-CM | POA: Diagnosis not present

## 2022-09-21 DIAGNOSIS — M722 Plantar fascial fibromatosis: Secondary | ICD-10-CM | POA: Diagnosis present

## 2022-09-21 DIAGNOSIS — Z9151 Personal history of suicidal behavior: Secondary | ICD-10-CM | POA: Diagnosis not present

## 2022-09-21 DIAGNOSIS — F1210 Cannabis abuse, uncomplicated: Secondary | ICD-10-CM | POA: Diagnosis present

## 2022-09-21 DIAGNOSIS — Z885 Allergy status to narcotic agent status: Secondary | ICD-10-CM

## 2022-09-21 DIAGNOSIS — Z79899 Other long term (current) drug therapy: Secondary | ICD-10-CM

## 2022-09-21 DIAGNOSIS — F4310 Post-traumatic stress disorder, unspecified: Secondary | ICD-10-CM | POA: Diagnosis present

## 2022-09-21 DIAGNOSIS — Z833 Family history of diabetes mellitus: Secondary | ICD-10-CM | POA: Diagnosis not present

## 2022-09-21 DIAGNOSIS — F1410 Cocaine abuse, uncomplicated: Secondary | ICD-10-CM | POA: Diagnosis present

## 2022-09-21 DIAGNOSIS — F332 Major depressive disorder, recurrent severe without psychotic features: Secondary | ICD-10-CM | POA: Diagnosis not present

## 2022-09-21 DIAGNOSIS — F329 Major depressive disorder, single episode, unspecified: Secondary | ICD-10-CM | POA: Diagnosis present

## 2022-09-21 DIAGNOSIS — Z886 Allergy status to analgesic agent status: Secondary | ICD-10-CM

## 2022-09-21 DIAGNOSIS — Z1152 Encounter for screening for COVID-19: Secondary | ICD-10-CM | POA: Diagnosis not present

## 2022-09-21 DIAGNOSIS — F312 Bipolar disorder, current episode manic severe with psychotic features: Secondary | ICD-10-CM

## 2022-09-21 DIAGNOSIS — Z881 Allergy status to other antibiotic agents status: Secondary | ICD-10-CM | POA: Diagnosis not present

## 2022-09-21 DIAGNOSIS — R0981 Nasal congestion: Secondary | ICD-10-CM

## 2022-09-21 LAB — URINE DRUG SCREEN, QUALITATIVE (ARMC ONLY)
Amphetamines, Ur Screen: NOT DETECTED
Barbiturates, Ur Screen: NOT DETECTED
Benzodiazepine, Ur Scrn: NOT DETECTED
Cannabinoid 50 Ng, Ur ~~LOC~~: POSITIVE — AB
Cocaine Metabolite,Ur ~~LOC~~: NOT DETECTED
MDMA (Ecstasy)Ur Screen: NOT DETECTED
Methadone Scn, Ur: NOT DETECTED
Opiate, Ur Screen: NOT DETECTED
Phencyclidine (PCP) Ur S: NOT DETECTED
Tricyclic, Ur Screen: NOT DETECTED

## 2022-09-21 LAB — CHLAMYDIA/NGC RT PCR (ARMC ONLY)
Chlamydia Tr: NOT DETECTED
N gonorrhoeae: NOT DETECTED

## 2022-09-21 LAB — HIV ANTIBODY (ROUTINE TESTING W REFLEX): HIV Screen 4th Generation wRfx: NONREACTIVE

## 2022-09-21 LAB — WET PREP, GENITAL
Clue Cells Wet Prep HPF POC: NONE SEEN
Sperm: NONE SEEN
Trich, Wet Prep: NONE SEEN
WBC, Wet Prep HPF POC: <10 (ref <10)
Yeast Wet Prep HPF POC: NONE SEEN

## 2022-09-21 MED ORDER — HYDROXYZINE HCL 10 MG PO TABS: 10.0000 mg | ORAL_TABLET | Freq: Three times a day (TID) | ORAL | Status: DC | PRN

## 2022-09-21 MED ORDER — HYDROXYZINE HCL 25 MG PO TABS
25.0000 mg | ORAL_TABLET | Freq: Three times a day (TID) | ORAL | Status: DC | PRN
  Administered 2022-09-21 – 2022-09-28 (×13): 25 mg via ORAL
  Filled 2022-09-21 (×12): qty 1

## 2022-09-21 MED ORDER — DIPHENHYDRAMINE HCL 25 MG PO CAPS
50.0000 mg | ORAL_CAPSULE | Freq: Three times a day (TID) | ORAL | Status: DC | PRN
  Administered 2022-09-21 – 2022-09-28 (×4): 50 mg via ORAL
  Filled 2022-09-21 (×4): qty 2

## 2022-09-21 MED ORDER — DIPHENHYDRAMINE HCL 50 MG/ML IJ SOLN: 50.0000 mg | Freq: Three times a day (TID) | INTRAMUSCULAR | Status: DC | PRN

## 2022-09-21 MED ORDER — ACETAMINOPHEN 325 MG PO TABS
650.0000 mg | ORAL_TABLET | Freq: Four times a day (QID) | ORAL | Status: DC | PRN
  Administered 2022-09-21: 650 mg via ORAL
  Filled 2022-09-21: qty 2

## 2022-09-21 MED ORDER — ACETAMINOPHEN 325 MG PO TABS
650.0000 mg | ORAL_TABLET | Freq: Four times a day (QID) | ORAL | Status: DC | PRN
  Administered 2022-09-21 – 2022-09-29 (×17): 650 mg via ORAL
  Filled 2022-09-21 (×17): qty 2

## 2022-09-21 MED ORDER — MAGNESIUM HYDROXIDE 400 MG/5ML PO SUSP: 30.0000 mL | Freq: Every day | ORAL | Status: DC | PRN

## 2022-09-21 MED ORDER — NICOTINE POLACRILEX 2 MG MT GUM
2.0000 mg | CHEWING_GUM | OROMUCOSAL | Status: DC | PRN
  Administered 2022-09-21 – 2022-09-29 (×26): 2 mg via ORAL
  Filled 2022-09-21 (×8): qty 1

## 2022-09-21 MED ORDER — HYDROXYZINE HCL 25 MG PO TABS
25.0000 mg | ORAL_TABLET | Freq: Three times a day (TID) | ORAL | Status: DC | PRN
  Administered 2022-09-21: 25 mg via ORAL
  Filled 2022-09-21: qty 1

## 2022-09-21 MED ORDER — LORAZEPAM 2 MG/ML IJ SOLN: 2.0000 mg | Freq: Three times a day (TID) | INTRAMUSCULAR | Status: DC | PRN

## 2022-09-21 MED ORDER — LORAZEPAM 1 MG PO TABS
2.0000 mg | ORAL_TABLET | Freq: Three times a day (TID) | ORAL | Status: DC | PRN
  Administered 2022-09-21 – 2022-09-27 (×3): 2 mg via ORAL
  Filled 2022-09-21 (×3): qty 2

## 2022-09-21 NOTE — BH Assessment (Signed)
BED WILL BE AVAILABLE PENDING DISCHARGES on 09/21/22  Patient has been accepted to [facility]  Patient room will be assigned pending discharges Accepting physician is Dr. Sherron Flemings.  Call report to [phone].  Representative was [facility] Kim.   ER Staff is aware of it:  [facility] ER Secretary  Dr. Dolores Frame, ER MD  [place] Patient's Nurse
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
## 2022-09-21 NOTE — ED Notes (Signed)
Patient escorted to go to medical bed for pelvic exam, Patient was calm and cooperative.
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
## 2022-09-21 NOTE — ED Notes (Signed)
Pt to restroom to provide urine sample

## 2022-09-21 NOTE — ED Notes (Signed)
Pt moved to warmer room per pt request.

## 2022-09-21 NOTE — ED Notes (Signed)
Pt back up to restroom. 

## 2022-09-21 NOTE — ED Notes (Signed)
Report to wendy, rn

## 2022-09-21 NOTE — BHH Group Notes (Signed)
Pt attended NA group

## 2022-09-21 NOTE — ED Notes (Signed)
BED WILL BE AVAILABLE PENDING DISCHARGES on 09/21/22 Patient has been accepted to [facility]  Patient room will be assigned pending discharges Accepting physician is Dr. Sherron Flemings.  Call report to [phone].  Representative was [facility] Kim.

## 2022-09-21 NOTE — BH Assessment (Signed)
Patient has been accepted to [facility] on today 09/21/22. Patient assigned to room 307, bed# 2. Accepting physician is Dr. Elsie Saas.  Call report to [phone].  Representative was [employer].   ER Staff is aware of it:  Misty Stanley, ER Secretary  Dr. Fanny Bien, ER MD  Toniann Fail Patient's Nurse

## 2022-09-21 NOTE — ED Notes (Signed)
Report off to Keyondra rn  

## 2022-09-21 NOTE — ED Notes (Signed)
Pt to restroom

## 2022-09-21 NOTE — ED Notes (Signed)
Pt up to restroom, water provided.

## 2022-09-21 NOTE — Tx Team (Signed)
Initial Treatment Plan 09/21/2022 5:29 PM Kelly Carr [id number]    PATIENT STRESSORS: Health problems   Marital or family conflict     PATIENT STRENGTHS: Motivation for treatment/growth  Supportive family/friends    PATIENT IDENTIFIED PROBLEMS: "Learn how to prioritize"  "Medication management"                    DISCHARGE CRITERIA:  Improved stabilization in mood, thinking, and/or behavior  PRELIMINARY DISCHARGE PLAN: Return to previous living arrangement  PATIENT/FAMILY INVOLVEMENT: This treatment plan has been presented to and reviewed with the patient, Kelly Carr.  The patient has been given the opportunity to ask questions and make suggestions.  Edwyna Perfect, RN 09/21/2022, 5:29 PM
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
## 2022-09-21 NOTE — ED Notes (Signed)
Dr. Dolores Frame notified of sane response.

## 2022-09-21 NOTE — ED Notes (Signed)
Pt provided with additional blanket
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
## 2022-09-21 NOTE — ED Notes (Signed)
Per dr. Dolores Frame notify SANE RN.
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
## 2022-09-21 NOTE — ED Notes (Signed)
Pt in restroom, yelling at this rn about incompetency of staff and lack of toilet paper in facility. Pt informed she may have toilet paper, all she has to do is ask at the nurse's station.

## 2022-09-21 NOTE — ED Notes (Signed)
[place]  [facility]   called  for  transport  to  [facility] 

## 2022-09-21 NOTE — ED Notes (Signed)
Pt out in dayroom interacting with another pt. Security in dayroom to monitor interaction.
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
## 2022-09-22 DIAGNOSIS — F1410 Cocaine abuse, uncomplicated: Secondary | ICD-10-CM | POA: Insufficient documentation

## 2022-09-22 DIAGNOSIS — F312 Bipolar disorder, current episode manic severe with psychotic features: Secondary | ICD-10-CM | POA: Diagnosis not present

## 2022-09-22 MED ORDER — ARIPIPRAZOLE 10 MG PO TABS
10.0000 mg | ORAL_TABLET | Freq: Every day | ORAL | Status: DC
  Administered 2022-09-22 – 2022-09-24 (×3): 10 mg via ORAL
  Filled 2022-09-22 (×4): qty 1

## 2022-09-22 MED ORDER — LORAZEPAM 1 MG PO TABS
1.0000 mg | ORAL_TABLET | Freq: Two times a day (BID) | ORAL | Status: AC
  Administered 2022-09-22 – 2022-09-24 (×4): 1 mg via ORAL
  Filled 2022-09-22 (×4): qty 1

## 2022-09-22 MED ORDER — ARIPIPRAZOLE ER 400 MG IM SRER: 400.0000 mg | INTRAMUSCULAR | Status: DC

## 2022-09-22 NOTE — Progress Notes (Signed)
Recreation Therapy Notes  INPATIENT RECREATION THERAPY ASSESSMENT  Patient Details Name: Kelly Carr MRN: [id number] DOB: May 25, 1971 Today's Date: 09/22/2022       Information Obtained From: Patient  Able to Participate in Assessment/Interview: Yes  Patient Presentation: Alert  Reason for Admission (Per Patient): Other (Comments) (Pt stated she had to get safe.  Patient also talked about needing to get medication for depression.)  Patient Stressors: Family  Coping Skills:   Journal, TV, Sports, Deep Breathing, Meditate, Exercise, Music, Impulsivity, Prayer, Art, Talk, Avoidance, Read, Hot Bath/Shower  Leisure Interests (2+):  Social - Family ("hangout with my man"; support people who support her)  Frequency of Recreation/Participation: Other (Comment) (Daily)  Awareness of Community Resources:  Yes  Community Resources:  Tree surgeon, Public affairs consultant, Coffee Shop  Current Use: Yes  If no, Barriers?:    Expressed Interest in [employer] Information: No  [employer] of Residence:  Film/video editor  Patient Main Form of Transportation: Set designer  Patient Strengths:  Honesty, Forensic scientist, Strong  Patient Identified Areas of Improvement:  "slow my roll; realize it's okay to ask for help, I don't have to everything on my own"  Patient Goal for Hospitalization:  "stay focused, get meds straight and need to get medication for depression"  Current SI (including self-harm):  No  Current HI:  No  Current AVH: No  Staff Intervention Plan: Group Attendance, Collaborate with Interdisciplinary Treatment Team  Consent to Intern Participation: N/A   Ithzel Fedorchak-McCall, LRT,CTRS Raedyn Klinck A Tianni Escamilla-McCall 09/22/2022, 12:52 PM

## 2022-09-22 NOTE — Progress Notes (Signed)
PT Cancellation Note  Patient Details Name: Kelly Carr MRN: [id number] DOB: 1970-09-25   Cancelled Treatment:     PT order received. OT who covers BH reported that pt was mobilizing well during recess and playing basketball. No PT mobility needs at this time. Please re order if this is not accurate. PT to sign off at this time.   Thank you     Marella Bile 09/22/2022, 4:52 PM Clois Dupes, PT, MPT Acute Rehabilitation Services Office: [phone] If a weekend: [facility] w/e pager [phone] 09/22/2022

## 2022-09-22 NOTE — Progress Notes (Signed)
Pt restless and keeps coming out in the hallway requesting for "things to do." She has poor boundaries with roommate and is very intrusive. When patient prompted about behavior, she becomes agitated and argumentative. Pt requesting to have a new ortho boot because she stated  " I torn all the ligaments in my leg and cannot walk." Pt is walking without current ortho boot. Her gait is steady and she has maintained good balance.   Pt given PRN PO meds in agitation protocol and fell alseep afterwards.    09/21/22 2115  Psych Admission Type (Psych Patients Only)  Admission Status Involuntary  Psychosocial Assessment  Patient Complaints Agitation;Anxiety;Irritability;Insomnia;Restlessness  Eye Contact Fair  Facial Expression Animated  Affect Labile  Speech Rapid;Tangential  Interaction Intrusive;Demanding  Motor Activity Fidgety  Appearance/Hygiene Unremarkable  Behavior Characteristics Anxious;Agitated;Intrusive;Fidgety  Mood Anxious;Labile  Thought Process  Coherency Tangential  Content Blaming others  Delusions Grandeur  Perception WDL  Hallucination None reported or observed  Judgment Poor  Confusion Mild  Danger to Self  Current suicidal ideation? Denies  Danger to Others  Danger to Others None reported or observed
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
## 2022-09-22 NOTE — Group Note (Signed)
Recreation Therapy Group Note   Group Topic:Leisure Education  Group Date: 09/22/2022 Start Time: 1000 End Time: 1036 Facilitators: Shakhia Gramajo-McCall, LRT,CTRS Location: [address] Dayroom   Goal Area(s) Addresses:  Patient will successfully identify positive leisure and recreation activities.  Patient will acknowledge benefits of participation in healthy leisure activities post discharge.  Patient will actively work with peers toward a shared goal.   Group Description: Pictionary. In groups of 5-7, patients took turns trying to guess the picture being drawn on the board by their teammate.  If the team guessed the correct answer, they won a point.  If the team guessed wrong, the other team got a chance to steal the point. After several rounds of game play, the team with the most points were declared winners. Post-activity discussion reviewed benefits of positive recreation outlets: reducing stress, improving coping mechanisms, increasing self-esteem, and building larger support systems.   Affect/Mood: Appropriate   Participation Level: Engaged   Participation Quality: Independent   Behavior: Appropriate   Speech/Thought Process: Focused   Insight: Good   Judgement: Good   Modes of Intervention: Competitive Play   Patient Response to Interventions:  Engaged   Education Outcome:  In group clarification offered    Clinical Observations/Individualized Feedback: Pt was appropriate during group session.  Pt would focus on drawing and guessing during activity.  Pt started over a few times when she didn't like how her drawings were looking.  Pt interacted well with peers.  Pt and peers would cheer each other on throughout group.     Plan: Continue to engage patient in RT group sessions 2-3x/week.   Helaman Mecca-McCall, LRT,CTRS 09/22/2022 12:25 PM

## 2022-09-22 NOTE — Progress Notes (Signed)
   09/22/22 0600  15 Minute Checks  Location Bedroom  Visual Appearance Calm  Behavior Composed  Sleep (Behavioral Health Patients Only)  Calculate sleep? (Click Yes once per 24 hr at 0600 safety check) Yes  Documented sleep last 24 hours 5.75

## 2022-09-22 NOTE — Group Note (Signed)
Date:  09/22/2022 Time:  6:58 PM  Group Topic/Focus:  Goals Group:   The focus of this group is to help patients establish daily goals to achieve during treatment and discuss how the patient can incorporate goal setting into their daily lives to aide in recovery. Orientation:   The focus of this group is to educate the patient on the purpose and policies of crisis stabilization and provide a format to answer questions about their admission.  The group details unit policies and expectations of patients while admitted.    Participation Level:  Active  Participation Quality:  Appropriate  Affect:  Appropriate  Cognitive:  Appropriate  Insight: Appropriate  Engagement in Group:  Engaged  Modes of Intervention:  Activity, Orientation, and Rapport Building  Additional Comments:   Pt attended and actively participated in the group. Pt personal goal is to get away from her family and "get out" of treatment.  Edmund Hilda Krystena Reitter 09/22/2022, 6:58 PM
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
## 2022-09-22 NOTE — Group Note (Signed)
Date:  09/22/2022 Time:  8:45 PM  Group Topic/Focus:  Wrap-Up Group:   The focus of this group is to help patients review their daily goal of treatment and discuss progress on daily workbooks.    Participation Level:  Active  Participation Quality:  Intrusive  Affect:  Excited  Cognitive:  Oriented  Insight: Appropriate  Engagement in Group:  Engaged  Modes of Intervention:  Education and Exploration  Additional Comments:  Patient attended and participated in group tonight. She reports that today she learn that whatever she don't get done today she can get it done tomorrow.  Lita Mains [facility] 09/22/2022, 8:45 PM

## 2022-09-22 NOTE — H&P (Signed)
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 Atarax 25 mg 3 times daily as needed for anxiety  Continue Ativan as needed for agitation p.o. or IM   The risks/benefits/side-effects/alternatives to this medication were discussed in detail with the patient and time was given for questions. The patient consents to medication trial.    -- Metabolic profile and EKG monitoring obtained while on an atypical antipsychotic (BMI: Lipid Panel: HbgA1c: QTc:)      3. Labs Reviewed: CMP no significant abnormalities except for slightly elevated AST ALT, CBC no significant abnormalities, hemoglobin A1c on 4/1 5.7, TSH within normal limits, UDS on 4/11 positive for cocaine  and marijuana and on 4/16 positive to marijuana only EKG 4/11 QTc 408, fasting lipid panel 4/1 no significant abnormalities except for slightly elevated LDL 109     Lab ordered: CMP 4/19 to follow-up regarding LFT   4. Tobacco Use Disorder  -- Nicorette gum as needed was ordered, patient refuses NicoDerm patch  -- Smoking cessation encouraged  5. Group and Therapy: -- Encouraged patient to participate in unit milieu and in scheduled group therapies   --Substance Use counseling: Patient was counseled regarding need to abstain completely from using marijuana and cocaine, she agrees  6. Discharge Planning:   -- Social work and case management to assist with discharge planning and identification of hospital follow-up needs prior to discharge  -- Estimated LOS: 5-7 days  -- Discharge Concerns: Need to establish a safety plan; Medication compliance and effectiveness  -- Discharge Goals: Return home with outpatient referrals for mental health follow-up including medication management/psychotherapy   The patient is agreeable with the medication plan, as above. We will monitor the patient's response to pharmacologic treatment, and adjust medications as necessary. Patient is encouraged to participate in group therapy while admitted to the psychiatric unit. We will address other chronic and acute stressors, which contributed to the patient's worsening mood instability and grandiose delusions, in  order to reduce the risk of self-harm at discharge.   Physician Treatment Plan for Primary Diagnosis: Severe manic bipolar 1 disorder with psychotic behavior Long Term Goal(s): Improvement in symptoms so as ready for discharge  Short Term Goals: Ability to identify changes in lifestyle to reduce recurrence of condition will improve, Ability to verbalize feelings will improve, Ability to disclose and discuss suicidal ideas, Ability to demonstrate self-control will improve, and Ability to identify and develop  effective coping behaviors will improve   I certify that inpatient services furnished can reasonably be expected to improve the patient's condition.    Total Time Spent in Direct Patient Care:  I personally spent 55 minutes on the unit in direct patient care. The direct patient care time included face-to-face time with the patient, reviewing the patient's chart, communicating with other professionals, and coordinating care. Greater than 50% of this time was spent in counseling or coordinating care with the patient regarding goals of hospitalization, psycho-education, and discharge planning needs.     Rose Hegner Abbott Pao, MD 4/18/20242:47 PM

## 2022-09-22 NOTE — Progress Notes (Signed)
Pt attending groups and is compliant with medication. Pt remains delusional as evidenced by her report to nurse that her reason for hospitalization is because she is marrying [name], a famous person, and her brother does not believe her so he came to her apartment had her hospitalized. Pt denies a/v hallucinations as well as SI/HI/self harm thoughts. Pt presents with anxious affect and tangential speech. Q 15 minute checks ongoing for safety.

## 2022-09-22 NOTE — Group Note (Signed)
Occupational Therapy Group Note  Group Topic: Sleep Hygiene  Group Date: 09/22/2022 Start Time: 1405 End Time: 1435 Facilitators: Ted Mcalpine, OT   Group Description: Group encouraged increased participation and engagement through topic focused on sleep hygiene. Patients reflected on the quality of sleep they typically receive and identified areas that need improvement. Group was given background information on sleep and sleep hygiene, including common sleep disorders. Group members also received information on how to improve one's sleep and introduced a sleep diary as a tool that can be utilized to track sleep quality over a length of time. Group session ended with patients identifying one or more strategies they could utilize or implement into their sleep routine in order to improve overall sleep quality.        Therapeutic Goal(s):  Identify one or more strategies to improve overall sleep hygiene  Identify one or more areas of sleep that are negatively impacted (sleep too much, too little, etc)     Participation Level: Engaged   Participation Quality: Independent   Behavior: Interactive    Speech/Thought Process: Tangential    Affect/Mood: Elevated   Insight: Limited   Judgement: Limited   Individualization: pt was engaged in their participation of group discussion/activity. New skills were identified  Modes of Intervention: Education  Patient Response to Interventions:  Attentive   Plan: Continue to engage patient in OT groups 2 - 3x/week.  09/22/2022  Ted Mcalpine, OT   Kerrin Champagne, OT

## 2022-09-23 ENCOUNTER — Encounter (HOSPITAL_COMMUNITY): Payer: Self-pay

## 2022-09-23 ENCOUNTER — Telehealth: Payer: Commercial Managed Care - HMO | Admitting: Physician Assistant

## 2022-09-23 DIAGNOSIS — F312 Bipolar disorder, current episode manic severe with psychotic features: Secondary | ICD-10-CM | POA: Diagnosis not present

## 2022-09-23 LAB — COMPREHENSIVE METABOLIC PANEL
ALT: 50 U/L — ABNORMAL HIGH (ref 0–44)
AST: 32 U/L (ref 15–41)
Albumin: 3.8 g/dL (ref 3.5–5.0)
Alkaline Phosphatase: 55 U/L (ref 38–126)
Anion gap: 9 (ref 5–15)
BUN: 12 mg/dL (ref 6–20)
CO2: 23 mmol/L (ref 22–32)
Calcium: 8.9 mg/dL (ref 8.9–10.3)
Chloride: 105 mmol/L (ref 98–111)
Creatinine, Ser: 0.64 mg/dL (ref 0.44–1.00)
GFR, Estimated: >60 mL/min (ref >60)
Glucose, Bld: 111 mg/dL — ABNORMAL HIGH (ref 70–99)
Potassium: 4.3 mmol/L (ref 3.5–5.1)
Sodium: 137 mmol/L (ref 135–145)
Total Bilirubin: 0.5 mg/dL (ref 0.3–1.2)
Total Protein: 6.7 g/dL (ref 6.5–8.1)

## 2022-09-23 MED ORDER — TRAZODONE HCL 50 MG PO TABS
50.0000 mg | ORAL_TABLET | Freq: Every day | ORAL | Status: DC
  Administered 2022-09-23: 50 mg via ORAL
  Filled 2022-09-23 (×2): qty 1

## 2022-09-23 NOTE — Progress Notes (Signed)
   09/22/22 1100  Psych Admission Type (Psych Patients Only)  Admission Status Involuntary  Psychosocial Assessment  Patient Complaints Anxiety  Eye Contact Fair  Facial Expression Animated  Affect Labile  Speech Logical/coherent  Interaction Assertive  Motor Activity Fidgety  Appearance/Hygiene Unremarkable  Behavior Characteristics Anxious  Mood Anxious;Suspicious  Thought Process  Coherency Circumstantial  Content Blaming others  Delusions Grandeur  Perception WDL  Hallucination None reported or observed  Judgment Poor  Confusion Mild  Danger to Self  Current suicidal ideation? Denies  Agreement Not to Harm Self Yes  Description of Agreement verbal  Danger to Others  Danger to Others None reported or observed
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
## 2022-09-23 NOTE — BH IP Treatment Plan (Signed)
Interdisciplinary Treatment and Diagnostic Plan Update  09/23/2022 Time of Session: 1030 Kelly Carr MRN: [id number]  Principal Diagnosis: Severe manic bipolar 1 disorder with psychotic behavior  Secondary Diagnoses: Principal Problem:   Severe manic bipolar 1 disorder with psychotic behavior Active Problems:   Cannabis abuse   Cocaine use disorder, mild, abuse   Current Medications:  Current Facility-Administered Medications  Medication Dose Route Frequency Provider Last Rate Last Admin   acetaminophen (TYLENOL) tablet 650 mg  650 mg Oral Q6H PRN Vanetta Mulders, NP   650 mg at 09/23/22 1307   ARIPiprazole (ABILIFY) tablet 10 mg  10 mg Oral Daily Abbott Pao, Nadir, MD   10 mg at 09/23/22 0819   [START ON 10/07/2022] ARIPiprazole ER (ABILIFY MAINTENA) injection 400 mg  400 mg Intramuscular Q28 days Abbott Pao, Nadir, MD       diphenhydrAMINE (BENADRYL) capsule 50 mg  50 mg Oral TID PRN Vanetta Mulders, NP   50 mg at 09/21/22 2232   Or   diphenhydrAMINE (BENADRYL) injection 50 mg  50 mg Intramuscular TID PRN Vanetta Mulders, NP       hydrOXYzine (ATARAX) tablet 25 mg  25 mg Oral TID PRN Vanetta Mulders, NP   25 mg at 09/22/22 2355   LORazepam (ATIVAN) tablet 2 mg  2 mg Oral TID PRN Vanetta Mulders, NP   2 mg at 09/21/22 2232   Or   LORazepam (ATIVAN) injection 2 mg  2 mg Intramuscular TID PRN Vanetta Mulders, NP       LORazepam (ATIVAN) tablet 1 mg  1 mg Oral BID Abbott Pao, Nadir, MD   1 mg at 09/23/22 0820   magnesium hydroxide (MILK OF MAGNESIA) suspension 30 mL  30 mL Oral Daily PRN Vanetta Mulders, NP       nicotine polacrilex (NICORETTE) gum 2 mg  2 mg Oral PRN Armandina Stammer I, NP   2 mg at 09/23/22 1305   traZODone (DESYREL) tablet 50 mg  50 mg Oral QHS Abbott Pao, Nadir, MD       PTA Medications: Facility-Administered Medications Prior to Admission  Medication Dose Route Frequency Provider Last Rate Last Admin   ARIPiprazole ER (ABILIFY MAINTENA) 400 MG prefilled  syringe 400 mg  400 mg Intramuscular Q28 days Hurst, Teresa T, PA-C       Medications Prior to Admission  Medication Sig Dispense Refill Last Dose   albuterol (VENTOLIN HFA) 108 (90 Base) MCG/ACT inhaler Inhale 1-2 puffs into the lungs every 6 (six) hours as needed for wheezing or shortness of breath.   Past Week   ARIPiprazole ER (ABILIFY MAINTENA) 400 MG SRER injection Inject 2 mLs (400 mg total) into the muscle every 28 (twenty-eight) days. 1 each 3 09/08/2022   cetirizine (ZYRTEC) 10 MG tablet Take 1 tablet (10 mg total) by mouth daily. 30 tablet 0 Past Month   diphenhydrAMINE (BENADRYL ALLERGY) 25 mg capsule Take 1 capsule (25 mg total) by mouth every 6 (six) hours as needed. 30 capsule 0 Past Month   hydrocortisone cream 1 % Apply 1 Application topically 2 (two) times daily as needed for itching.   Past Week   hydrOXYzine (ATARAX) 10 MG tablet Take 1-3 tablets (10-30 mg total) by mouth 3 (three) times daily as needed. 90 tablet 0 Past Week   ibuprofen (ADVIL) 600 MG tablet Take by mouth.      Multiple Vitamin (MULTIVITAMIN WITH MINERALS) TABS tablet Take 1 tablet by mouth daily.   Past Week  Patient Stressors: Health problems   Marital or family conflict    Patient Strengths: Motivation for treatment/growth  Supportive family/friends   Treatment Modalities: Medication Management, Group therapy, Case management,  1 to 1 session with clinician, Psychoeducation, Recreational therapy.   Physician Treatment Plan for Primary Diagnosis: Severe manic bipolar 1 disorder with psychotic behavior Long Term Goal(s): Improvement in symptoms so as ready for discharge   Short Term Goals: Ability to identify changes in lifestyle to reduce recurrence of condition will improve Ability to verbalize feelings will improve Ability to disclose and discuss suicidal ideas Ability to demonstrate self-control will improve Ability to identify and develop effective coping behaviors will improve  Medication  Management: Evaluate patient's response, side effects, and tolerance of medication regimen.  Therapeutic Interventions: 1 to 1 sessions, Unit Group sessions and Medication administration.  Evaluation of Outcomes: Progressing  Physician Treatment Plan for Secondary Diagnosis: Principal Problem:   Severe manic bipolar 1 disorder with psychotic behavior Active Problems:   Cannabis abuse   Cocaine use disorder, mild, abuse  Long Term Goal(s): Improvement in symptoms so as ready for discharge   Short Term Goals: Ability to identify changes in lifestyle to reduce recurrence of condition will improve Ability to verbalize feelings will improve Ability to disclose and discuss suicidal ideas Ability to demonstrate self-control will improve Ability to identify and develop effective coping behaviors will improve     Medication Management: Evaluate patient's response, side effects, and tolerance of medication regimen.  Therapeutic Interventions: 1 to 1 sessions, Unit Group sessions and Medication administration.  Evaluation of Outcomes: Progressing   RN Treatment Plan for Primary Diagnosis: Severe manic bipolar 1 disorder with psychotic behavior Long Term Goal(s): Knowledge of disease and therapeutic regimen to maintain health will improve  Short Term Goals: Ability to remain free from injury will improve, Ability to verbalize frustration and anger appropriately will improve, Ability to demonstrate self-control, Ability to participate in decision making will improve, Ability to verbalize feelings will improve, Ability to disclose and discuss suicidal ideas, Ability to identify and develop effective coping behaviors will improve, and Compliance with prescribed medications will improve  Medication Management: RN will administer medications as ordered by provider, will assess and evaluate patient's response and provide education to patient for prescribed medication. RN will report any adverse and/or  side effects to prescribing provider.  Therapeutic Interventions: 1 on 1 counseling sessions, Psychoeducation, Medication administration, Evaluate responses to treatment, Monitor vital signs and CBGs as ordered, Perform/monitor CIWA, COWS, AIMS and Fall Risk screenings as ordered, Perform wound care treatments as ordered.  Evaluation of Outcomes: Progressing   LCSW Treatment Plan for Primary Diagnosis: Severe manic bipolar 1 disorder with psychotic behavior Long Term Goal(s): Safe transition to appropriate next level of care at discharge, Engage patient in therapeutic group addressing interpersonal concerns.  Short Term Goals: Engage patient in aftercare planning with referrals and resources, Increase social support, Increase ability to appropriately verbalize feelings, Increase emotional regulation, Facilitate acceptance of mental health diagnosis and concerns, Facilitate patient progression through stages of change regarding substance use diagnoses and concerns, Identify triggers associated with mental health/substance abuse issues, and Increase skills for wellness and recovery  Therapeutic Interventions: Assess for all discharge needs, 1 to 1 time with Social worker, Explore available resources and support systems, Assess for adequacy in community support network, Educate family and significant other(s) on suicide prevention, Complete Psychosocial Assessment, Interpersonal group therapy.  Evaluation of Outcomes: Progressing   Progress in Treatment: Attending groups: Yes. Participating in  groups: Yes. Taking medication as prescribed: Yes. Toleration medication: Yes. Family/Significant other contact made: No, will contact:  Tiffany (Daughter) Patient understands diagnosis: Yes. Discussing patient identified problems/goals with staff: Yes. Medical problems stabilized or resolved: Yes. Denies suicidal/homicidal ideation: Yes. Issues/concerns per patient self-inventory: Yes. Other:  N/A  New problem(s) identified: No, Describe:  None Reported  New Short Term/Long Term Goal(s):   Patient Goals:  Coping Skills/Medication Management  Discharge Plan or Barriers: None Reported  Reason for Continuation of Hospitalization: Anxiety Depression Mania Medication stabilization Suicidal ideation Withdrawal symptoms  Estimated Length of Stay: 3-7 Days  Last 3 [place] Suicide Severity Risk Score: Flowsheet Row Ad[facility] (Current) [facility] from 09/20/2022 in [facility] ED from 09/18/2022 in [facility]  C-SSRS RISK CATEGORY No Risk High Risk No Risk       Last PHQ 2/9 Scores:    09/15/2022   10:27 AM 08/30/2022   11:25 AM 11/12/2021    8:42 AM  Depression screen PHQ 2/9  Decreased Interest 0 0 0  Down, Depressed, Hopeless 2 0 0  PHQ - 2 Score 2 0 0  Altered sleeping 0 1 0  Tired, decreased energy 1 3 0  Change in appetite 3 3 0  Feeling bad or failure about yourself  3 0 0  Trouble concentrating 0 0 0  Moving slowly or fidgety/restless 0 0 0  Suicidal thoughts 0 0 0  PHQ-9 Score 9 7 0  detox, medication management for mood stabilization; elimination of SI thoughts; development of comprehensive mental wellness/sobriety plan    Scribe for Treatment Team: Ane Payment, LCSW 09/23/2022 2:06 PM

## 2022-09-23 NOTE — Progress Notes (Signed)
   09/23/22 2030  Psych Admission Type (Psych Patients Only)  Admission Status Involuntary  Psychosocial Assessment  Patient Complaints Anxiety  Eye Contact Fair  Facial Expression Animated  Affect Labile;Preoccupied  Speech Pressured;Actuary;Intrusive  Motor Activity Fidgety  Appearance/Hygiene Unremarkable  Behavior Characteristics Anxious;Cooperative  Mood Anxious  Aggressive Behavior  Effect No apparent injury  Thought Process  Coherency Circumstantial  Content Blaming others  Delusions Grandeur  Perception WDL  Hallucination None reported or observed  Judgment Poor  Confusion WDL  Danger to Self  Current suicidal ideation? Denies  Agreement Not to Harm Self Yes  Description of Agreement Verbal contract  Danger to Others  Danger to Others None reported or observed

## 2022-09-23 NOTE — Group Note (Signed)
Date:  09/23/2022 Time:  2:04 PM  Group Topic/Focus:  Dimensions of Wellness:   The focus of this group is to introduce the topic of wellness and discuss the role each dimension of wellness plays in total health.    Participation Level:  Active  Participation Quality:  Appropriate  Affect:  Appropriate  Cognitive:  Appropriate  Insight: Appropriate  Engagement in Group:  Engaged  Modes of Intervention:  Discussion Patient attended group and participated.  Additional Comments:    Kelly Carr 09/23/2022, 2:04 PM

## 2022-09-23 NOTE — Progress Notes (Signed)
Patient reports "my anxiety is off the roof, my leg is killing me, I should be on codeine, but that will be after my surgery." Patient hyperactive, congential, with pressure speech. Patient denies SI, HI, & AVH. PRN Tylenol 650 mg for right ankle pain, Hydroxyzine 25 mg for anxiety administered per MAR. Support and encouragement provided. Patient attended group this evening, observed interacting with peers and dancing in the day room, patient animated and pleasant, in no form of distress. Safety maintained at Q 15 minutes checks.

## 2022-09-23 NOTE — Plan of Care (Signed)
  Problem: Education: Goal: Knowledge of [facility] Education information/materials will improve Outcome: Progressing Goal: Verbalization of understanding the information provided will improve Outcome: Progressing   Problem: Coping: Goal: Ability to demonstrate self-control will improve Outcome: Progressing   Problem: Physical Regulation: Goal: Ability to maintain clinical measurements within normal limits will improve Outcome: Progressing   Problem: Safety: Goal: Periods of time without injury will increase Outcome: Progressing

## 2022-09-23 NOTE — Group Note (Signed)
Date:  09/23/2022 Time:  9:18 PM  Group Topic/Focus:  Wrap-Up Group:   The focus of this group is to help patients review their daily goal of treatment and discuss progress on daily workbooks.    Participation Level:  Active  Participation Quality:  Appropriate  Affect:  Appropriate  Cognitive:  Appropriate  Insight: Appropriate  Engagement in Group:  Engaged  Modes of Intervention:  Education and Exploration  Additional Comments:  Patient attended and participated in group tonight. She reports that the best thing that happen to her today was that she got to go outside, had music and got to dance. She also talked to her Doctor and her Social Worker  Lita Mains [facility] 09/23/2022, 9:18 PM

## 2022-09-23 NOTE — Progress Notes (Signed)
   09/23/22 0628  15 Minute Checks  Location Dayroom  Visual Appearance Calm  Behavior Composed  Sleep (Behavioral Health Patients Only)  Calculate sleep? (Click Yes once per 24 hr at 0600 safety check) Yes  Documented sleep last 24 hours 5
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
## 2022-09-23 NOTE — Group Note (Signed)
Recreation Therapy Group Note   Group Topic:Problem Solving  Group Date: 09/23/2022 Start Time: 1005 End Time: 1026 Facilitators: Gurnoor Sloop-McCall, LRT,CTRS Location: [address] Dayroom   Goal Area(s) Addresses:  Patient will effectively work with peer towards shared goal.  Patient will identify skills used to make activity successful.  Patient will identify how skills used during activity can be applied to reach post d/c goals.    Group Description: [employer]. In teams of 5-6, patients were given 11 craft pipe cleaners. Using the materials provided, patients were instructed to compete again the opposing team(s) to build the tallest free-standing structure from floor level. The activity was timed; difficulty increased by Clinical research associate as Production designer, theatre/television/film continued.  Systematically resources were removed with additional directions for example, placing one arm behind their back, working in silence, and shape stipulations. LRT facilitated post-activity discussion reviewing team processes and necessary communication skills involved in completion. Patients were encouraged to reflect how the skills utilized, or not utilized, in this activity can be incorporated to positively impact support systems post discharge.   Affect/Mood: Appropriate   Participation Level: Engaged   Participation Quality: Independent   Behavior: Appropriate   Speech/Thought Process: Focused   Insight: Good   Judgement: Good   Modes of Intervention: Problem-solving   Patient Response to Interventions:  Engaged   Education Outcome:  In group clarification offered    Clinical Observations/Individualized Feedback: Pt was bright and worked well with peers.  Pt expressed learning from the activity, you have to be willing to use the help you have.  Pt also stated in doing that, see where you need the help that way one person can hold things down on one end while you lift the other end.     Plan:  Continue to engage patient in RT group sessions 2-3x/week.   Zed Wanninger-McCall, LRT,CTRS 09/23/2022 10:54 AM

## 2022-09-23 NOTE — [facility] Group Notes (Signed)
Spiritual care group facilitated by Chaplain Dyanne Carrel, [facility]  Group focused on topic of strength. Group members reflected on what thoughts and feelings emerge when they hear this topic. They then engaged in facilitated dialog around how strength is present in their lives. This dialog focused on representing what strength had been to them in their lives (images and patterns given) and what they saw as helpful in their life now (what they needed / wanted).  Activity drew on narrative framework.  Patient Progress: Kelly Carr attended group and actively engaged in group conversation. Her strength comes from her faith as well as from her purpose in life.  She shared about this with the group and was also supportive of peers.

## 2022-09-23 NOTE — Progress Notes (Signed)
Pt remains delusional, grandeur in nature, tangential on interactions "I want to go to [facility] to see my man, he's a celebrity. I'm going to [place] to move far away from my brother. My family racist, especially my brother". Animated, restless but she's pleasant but is still  Denies SI, HI, AVH and pain when assessed. Observed playing basketball in courtyard without her ankle boot and dancing with boot on. Educated on safety measures with falls. PRN Tylenol given at 1307 for right leg pain 10/10 with desired effect when reassessed 1400. Support, encouragement and reassurance offered. Safety checks maintained at Q 15 minutes intervals. Tolerated meals, fluids and medications well. Off unit to courtyard and cafeteria for scheduled meals; returned without issues.

## 2022-09-23 NOTE — Group Note (Signed)
Date:  09/23/2022 Time:  1:48 PM  Group Topic/Focus:  Goals Group:   The focus of this group is to help patients establish daily goals to achieve during treatment and discuss how the patient can incorporate goal setting into their daily lives to aide in recovery. Orientation:   The focus of this group is to educate the patient on the purpose and policies of crisis stabilization and provide a format to answer questions about their admission.  The group details unit policies and expectations of patients while admitted.    Participation Level:  Active  Participation Quality:  Appropriate  Affect:  Appropriate  Cognitive:  Appropriate  Insight: Appropriate  Engagement in Group:  Engaged  Modes of Intervention:  Discussion  Additional Comments:  Patient attended morning orientation/Goal setting group and said her goal for today is self care.   Kelly Carr 09/23/2022, 1:48 PM

## 2022-09-23 NOTE — Plan of Care (Signed)
  Problem: Education: Goal: Knowledge of [facility] Education information/materials will improve Outcome: Progressing Goal: Mental status will improve Outcome: Progressing Goal: Verbalization of understanding the information provided will improve Outcome: Progressing   Problem: Coping: Goal: Ability to demonstrate self-control will improve Outcome: Progressing   Problem: Safety: Goal: Periods of time without injury will increase Outcome: Progressing

## 2022-09-23 NOTE — Progress Notes (Signed)
Patient has been up most of the night, constantly seeking for medications, such as Codeine, nicotine gums, sleeping and anxiety medications. Patient reports right ankle pain and rated her pain 10/10, patient stated, "my ankle is burning and swollen, and I will be having a foot surgery in two weeks time." Ankle assessed without physical swelling or redness, PRN Tylenol 650 mg administered per MAR. Plan of care ongoing.

## 2022-09-24 DIAGNOSIS — F312 Bipolar disorder, current episode manic severe with psychotic features: Secondary | ICD-10-CM | POA: Diagnosis not present

## 2022-09-24 MED ORDER — LORAZEPAM 1 MG PO TABS
1.0000 mg | ORAL_TABLET | Freq: Two times a day (BID) | ORAL | Status: AC
  Administered 2022-09-24 – 2022-09-26 (×4): 1 mg via ORAL
  Filled 2022-09-24 (×4): qty 1

## 2022-09-24 MED ORDER — ARIPIPRAZOLE 15 MG PO TABS
15.0000 mg | ORAL_TABLET | Freq: Every day | ORAL | Status: DC
  Administered 2022-09-25 – 2022-09-26 (×2): 15 mg via ORAL
  Filled 2022-09-24 (×4): qty 1

## 2022-09-24 MED ORDER — TRAZODONE HCL 100 MG PO TABS
100.0000 mg | ORAL_TABLET | Freq: Every day | ORAL | Status: DC
  Administered 2022-09-24 – 2022-09-25 (×2): 100 mg via ORAL
  Filled 2022-09-24 (×4): qty 1

## 2022-09-24 NOTE — Group Note (Signed)
Date:  09/24/2022 Time:  9:17 PM  Group Topic/Focus:  Wrap-Up Group:   The focus of this group is to help patients review their daily goal of treatment and discuss progress on daily workbooks.    Participation Level:  Active  Participation Quality:  Appropriate  Affect:  Appropriate  Cognitive:  Appropriate  Insight: Appropriate  Engagement in Group:  Engaged  Modes of Intervention:  Education and Exploration  Additional Comments:  Patient attended and participated in group tonight. She reports that she love that she is loyal, committed and dependable.  Lita Mains [facility] 09/24/2022, 9:17 PM

## 2022-09-24 NOTE — Progress Notes (Signed)
   09/24/22 0600  15 Minute Checks  Location Bedroom  Visual Appearance Calm  Behavior Composed  Sleep (Behavioral Health Patients Only)  Calculate sleep? (Click Yes once per 24 hr at 0600 safety check) Yes  Documented sleep last 24 hours 3.25
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
## 2022-09-24 NOTE — Progress Notes (Signed)
PT Cancellation Note  Patient Details Name: Kelly Carr MRN: [id number] DOB: 02-14-1971   Cancelled Treatment:     PT order received but PT intervention deferred.  Brace situation has been addressed to pt satisfaction by onsite OT.  PT service will sign off.   Emiyah Spraggins 09/24/2022, 6:26 AM

## 2022-09-24 NOTE — Progress Notes (Signed)
At about 0130, patient awake and complained  that she is unable to sleep because her bed is "sitting up" and wanted to see if she can have another medication for sleep. Patient appears anxious, presents with pressured speech, tangential and argumentative on conversation, med seeking, and  asking for nicotine gum. Patient became verbally abusive and upset when this writer told her that it's late for her to have nicotine gum. Patient received PRN Hydroxyzine 25 mg at 0136 for  anxiety, patient encouraged to go back to bed, and reassured the issue with her bed will be addressed in the morning. Patient in no distress, safety maintained at Q 15 minutes intervals.

## 2022-09-24 NOTE — Progress Notes (Signed)
Pt's mood remains labile with intermittent verbal outburst X 1 with brother on phone this shift. She was verbally redirectable which can be difficult at times. Observed with mild confusion, still preoccupied about her celebrity boyfriend Erroll Luna "You know him. The doctor thinks I'm lying and was asking me to call my boyfriend. If I had my phone, I will call him". Continues to be intrusive with pressured, argumentative speech. Requires multiple verbal redirections this shift. However, pt denies SI, HI, AVH and pain. Slept poorly last night "I was up for most of the night, I needed my medicines and my gum" despite multiple PRNS (EMAR). Reports good appetite. PRN Tylenol 650 mg PO given for left foot pain 7/10 at 1819 and she reported some relief when reassessed. Engaged in scheduled groups and was appropriate. Safety checks continues at Q 15 minutes intervals. Emotional support, encouragement and reassurance offered to pt this shift. Pt attended to her ADLs. Tolerated meals, fluids and medications well without issues.

## 2022-09-25 DIAGNOSIS — F312 Bipolar disorder, current episode manic severe with psychotic features: Secondary | ICD-10-CM | POA: Diagnosis not present

## 2022-09-25 NOTE — [facility] Group Notes (Signed)
Adult Psychoeducational Group Note  Date:  09/25/2022 Time:  6:47 PM  Group Topic/Focus:  Goals Group:   The focus of this group is to help patients establish daily goals to achieve during treatment and discuss how the patient can incorporate goal setting into their daily lives to aide in recovery. Orientation:   The focus of this group is to educate the patient on the purpose and policies of crisis stabilization and provide a format to answer questions about their admission.  The group details unit policies and expectations of patients while admitted.  Participation Level:  Active  Participation Quality:  Appropriate  Affect:  Appropriate  Cognitive:  Appropriate  Insight: Appropriate  Engagement in Group:  Engaged  Modes of Intervention:  Discussion  Additional Comments:  Pt attended the goals/orientation group and remained appropriate and engaged throughout the duration of the group.   Sheran Lawless 09/25/2022, 6:47 PM
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
## 2022-09-25 NOTE — Progress Notes (Addendum)
Pt is A&OX4, calm, remains delusional and believes the entertainer [name] is her boyfriend, med-seeking, malingering, denies suicidal ideations, denies homicidal ideations, denies auditory hallucinations and denies visual hallucinations. Pt verbally agrees to approach staff if these become apparent and before harming self or others. Pt denies experiencing nightmares. Mood and affect are congruent. Pt appetite is ok. Pt c/o pain to leg ("10/10") and anxiety. Pt's memory appears to be grossly intact, and Pt hasn't displayed any injurious behaviors. Pt is medication compliant. There's no evidence of suicidal intent. Psychomotor activity was WNL. No s/s of Parkinson, Dystonia, Akathisia and/or Tardive Dyskinesia noted.

## 2022-09-25 NOTE — [facility] Group Notes (Signed)
Adult Psychoeducational Group Note  Date:  09/25/2022 Time:  8:50 PM  Group Topic/Focus:  Wrap-Up Group:   The focus of this group is to help patients review their daily goal of treatment and discuss progress on daily workbooks.  Participation Level:  Active  Participation Quality:  Appropriate  Affect:  Excited  Cognitive:  Disorganized and Delusional  Insight: Good  Engagement in Group:  Engaged  Modes of Intervention:  Discussion  Additional Comments:   Pt was very tangential during group spending majority of the time talking about how she has issues with her family and how she's dating a celebrity whose going to take care of her when she leaves. Pt has been using coloring, food, and exercise as coping skills. Pt denied everything.  Kelly Carr 09/25/2022, 8:50 PM

## 2022-09-25 NOTE — Progress Notes (Signed)
   09/25/22 1100  Charting Type  Charting Type Shift assessment  Safety Check Verification  Has the RN verified the 15 minute safety check completion? Yes  Neurological  Neuro (WDL) WDL  HEENT  HEENT (WDL) WDL  Respiratory  Respiratory (WDL) WDL  Cardiac  Cardiac (WDL) WDL  Vascular  Vascular (WDL) WDL  Integumentary  Integumentary (WDL) WDL  Braden Scale (Ages 8 and up)  Sensory Perceptions 4  Moisture 4  Activity 4  Mobility 4  Nutrition 3  Friction and Shear 3  Braden Scale Score 22  Musculoskeletal  Musculoskeletal (WDL) X  Musculoskeletal Details  Right Foot Ortho/Supportive Device  Gastrointestinal  Gastrointestinal (WDL) WDL  GU Assessment  Genitourinary (WDL) WDL  Neurological  Level of Consciousness Alert   Patient c/o pain in right foot, patient was given PRN Tylenol and encouraged to keep the brace on as advised by physician.

## 2022-09-26 DIAGNOSIS — F312 Bipolar disorder, current episode manic severe with psychotic features: Secondary | ICD-10-CM | POA: Diagnosis not present

## 2022-09-26 MED ORDER — TRAZODONE HCL 100 MG PO TABS
200.0000 mg | ORAL_TABLET | Freq: Every day | ORAL | Status: DC
  Administered 2022-09-26 – 2022-09-28 (×3): 200 mg via ORAL
  Filled 2022-09-26 (×6): qty 2

## 2022-09-26 MED ORDER — ARIPIPRAZOLE 10 MG PO TABS
20.0000 mg | ORAL_TABLET | Freq: Every day | ORAL | Status: DC
  Administered 2022-09-27 – 2022-09-29 (×3): 20 mg via ORAL
  Filled 2022-09-26 (×5): qty 2
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
## 2022-09-26 NOTE — Group Note (Signed)
Recreation Therapy Group Note   Group Topic:Coping Skills  Group Date: 09/26/2022 Start Time: 1022 End Time: 1105 Facilitators: Johnn Krasowski-McCall, LRT,CTRS Location: [address] Dayroom   Goal Area(s) Addresses: Patient will define what a coping skill is. Patient will work to create a list of healthy coping skills beginning with each letter of the alphabet. Patient will successfully identify positive coping skills they can use post d/c.  Patient will acknowledge benefit(s) of using learned coping skills post d/c.  Group Description: Coping A to Z. Patient asked to identify what a coping skill is and when they use them. Patients with Clinical research associate discussed healthy versus unhealthy coping skills. Next patients were given a blank worksheet titled "Coping Skills A-Z" . Patients were instructed to come up with at least one positive coping skill per letter of the alphabet, addressing a specific challenge (ex: substance abuse, anger, anxiety, isolation, family, finances) patients were given 15 minutes to brainstorm, before ideas were presented to the large group. Patients and LRT debriefed on the importance of coping skill selection based on situation and back-up plans when a skill tried is not effective. At the end of group, patients were given an handout of alphabetized strategies to keep for future reference.   Affect/Mood: Appropriate   Participation Level: Engaged   Participation Quality: Independent   Behavior: Appropriate   Speech/Thought Process: Focused   Insight: Good   Judgement: Good   Modes of Intervention: Worksheet   Patient Response to Interventions:  Engaged   Education Outcome:  In group clarification offered    Clinical Observations/Individualized Feedback: Pt was bright and focused during group session.  Pt was engaged throughout group.  Pt came up with coping skills for family.  Pt came up with the coping skills of positive affirmations, acceptance of beliefs, thank  others when necessary, engage in the positive, forgiveness, pray for them, zen, justify beliefs, stay in the word and help them if you can/help self first.     Plan: Continue to engage patient in RT group sessions 2-3x/week.   Taha Dimond-McCall, LRT,CTRS 09/26/2022 11:57 AM

## 2022-09-26 NOTE — B[facility] Group Notes (Signed)
Adult Psychoeducational Group Note  Date:  09/26/2022 Time:  8:41 PM  Group Topic/Focus:  Wrap-Up Group:   The focus of this group is to help patients review their daily goal of treatment and discuss progress on daily workbooks.  Participation Level:  Active  Participation Quality:  Appropriate  Affect:  Appropriate  Cognitive:  Disorganized and Delusional  Insight: Good  Engagement in Group:  Engaged  Modes of Intervention:  Discussion  Additional Comments:   Pt states that she had a wonderful day, spoke with her CSW about some "personal issues"; pt spoke with family members and enjoyed her outside time today. Pt is tangential about her personal life and issues with her family during group. Pt denied everything. Pt states that she's "appreciative that there are people in this life that are blessings"  Vevelyn Pat 09/26/2022, 8:41 PM

## 2022-09-26 NOTE — Group Note (Signed)
LCSW Group Therapy Note  Group Date: 09/26/2022 Start Time: 1300 End Time: 1400   Type of Therapy and Topic:  Group Therapy - How To Cope with Nervousness about Discharge   Participation Level:  Active   Description of Group This process group involved identification of patients' feelings about discharge. Some of them are scheduled to be discharged soon, while others are new admissions, but each of them was asked to share thoughts and feelings surrounding discharge from the hospital. One common theme was that they are excited at the prospect of going home, while another was that many of them are apprehensive about sharing why they were hospitalized. Patients were given the opportunity to discuss these feelings with their peers in preparation for discharge.  Therapeutic Goals  Patient will identify their overall feelings about pending discharge. Patient will think about how they might proactively address issues that they believe will once again arise once they get home (i.e. with parents). Patients will participate in discussion about having hope for change.   Summary of Patient Progress:  Kelly Carr was very active throughout the session. She demonstrated good insight into the subject matter, and proved open to input from peers and feedback from CSW. She was respectful of peers and participated throughout the entire session.   Therapeutic Modalities Cognitive Behavioral Therapy   [name], LCSW 09/26/2022  2:25 PM

## 2022-09-26 NOTE — Progress Notes (Signed)
   09/26/22 0556  15 Minute Checks  Location Dayroom  Visual Appearance Calm  Behavior Composed  Sleep (Behavioral Health Patients Only)  Calculate sleep? (Click Yes once per 24 hr at 0600 safety check) Yes  Documented sleep last 24 hours 4.25

## 2022-09-26 NOTE — Progress Notes (Signed)
Pt observed to be tearful this afternoon after conversation with CSW in reference to family meeting. Approached Clinical research associate "She wants to make a meeting with my brother but he talks down to me. I don't want to say it in front of my daughter and his wife. It's too much". Reports she slept fair last night with good appetite. 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 Denies SI, HI, pain and AVH when assessed. Emotional support, encouragement and reassurance offered to pt. Safety checks maintained at Q 15 minutes intervals. She's verbally redirectable at this time. Compliant with medications when offered and denies adverse drug reactions. Off unit for meals, courtyard, returned without issues. Tolerates meals and fluids well. Safety maintained.

## 2022-09-26 NOTE — B[facility] Group Notes (Signed)
Adult Psychoeducational Group Note  Date:  09/26/2022 Time:  4:08 PM  Group Topic/Focus:  Goals Group:   The focus of this group is to help patients establish daily goals to achieve during treatment and discuss how the patient can incorporate goal setting into their daily lives to aide in recovery. Orientation:   The focus of this group is to educate the patient on the purpose and policies of crisis stabilization and provide a format to answer questions about their admission.  The group details unit policies and expectations of patients while admitted.  Participation Level:  Active  Participation Quality:  Appropriate  Affect:  Appropriate  Cognitive:  Appropriate  Insight: Appropriate  Engagement in Group:  Engaged  Modes of Intervention:  Discussion  Additional Comments:  Pt attended the goals/orientation group and remained appropriate and engaged throughout the duration of the group.   Sheran Lawless 09/26/2022, 4:08 PM

## 2022-09-26 NOTE — Progress Notes (Signed)
[facility] LCSW Progress Note   09/26/2022    2:14 PM  Lavanya Mericle   [id number]   Type of Contact and Topic:  Family Meeting  Patient stopped Child psychotherapist and informed social worker that she does not want to have a family meeting due to traumatic past with family meeting. CSW informed patient Child psychotherapist and doctor.     Signed:  Anson [place] MSW, LCSW, LCAS 09/26/2022 2:14 PM

## 2022-09-26 NOTE — Progress Notes (Signed)
Pt agitated on the unit with another pt, pt given PRN agitation protocol with Hs medication    09/26/22 2015  Psych Admission Type (Psych Patients Only)  Admission Status Involuntary  Psychosocial Assessment  Patient Complaints Anxiety  Eye Contact Fair  Facial Expression Anxious;Angry  Affect Anxious;Preoccupied;Irritable;Labile  Speech Argumentative;Tangential  Interaction Intrusive;Needy  Motor Activity Restless;Fidgety  Appearance/Hygiene Unremarkable  Behavior Characteristics Cooperative  Mood Labile  Aggressive Behavior  Effect No apparent injury  Thought Process  Coherency Circumstantial  Content Blaming others  Delusions Grandeur  Perception WDL  Hallucination None reported or observed  Judgment Poor  Confusion Mild  Danger to Self  Current suicidal ideation? Denies  Danger to Others  Danger to Others None reported or observed

## 2022-09-27 ENCOUNTER — Encounter (HOSPITAL_COMMUNITY): Payer: Self-pay | Admitting: Psychiatry

## 2022-09-27 DIAGNOSIS — F312 Bipolar disorder, current episode manic severe with psychotic features: Secondary | ICD-10-CM | POA: Diagnosis not present
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
## 2022-09-27 NOTE — [facility] Group Notes (Signed)
Adult Psychoeducational Group Note  Date:  09/27/2022 Time:  11:36 AM  Group Topic/Focus:  Goals Group:   The focus of this group is to help patients establish daily goals to achieve during treatment and discuss how the patient can incorporate goal setting into their daily lives to aide in recovery. Orientation:   The focus of this group is to educate the patient on the purpose and policies of crisis stabilization and provide a format to answer questions about their admission.  The group details unit policies and expectations of patients while admitted.  Participation Level:  Active  Participation Quality:  Monopolizing  Affect:  Tearful  Cognitive:  Lacking  Insight: None  Engagement in Group:  Engaged  Modes of Intervention:  Discussion  Additional Comments:  Pt attended the goals/orientation  group and remained appropriate and engaged throughout the duration of the group.   Sheran Lawless 09/27/2022, 11:36 AM

## 2022-09-27 NOTE — Progress Notes (Signed)
   09/27/22 0800  Psych Admission Type (Psych Patients Only)  Admission Status Involuntary  Psychosocial Assessment  Patient Complaints Anxiety  Eye Contact Fair  Facial Expression Anxious  Affect Anxious;Irritable;Labile  Speech Pressured;Horticulturist, commercial;Intrusive  Motor Activity Restless  Appearance/Hygiene Unremarkable  Behavior Characteristics Cooperative  Mood Labile  Aggressive Behavior  Effect No apparent injury  Thought Process  Coherency Circumstantial  Content Blaming others  Delusions Grandeur  Perception WDL  Hallucination None reported or observed  Judgment Poor  Confusion Mild  Danger to Self  Current suicidal ideation? Denies  Agreement Not to Harm Self Yes  Description of Agreement verbal  Danger to Others  Danger to Others None reported or observed

## 2022-09-27 NOTE — Group Note (Signed)
Recreation Therapy Group Note   Group Topic:Communication  Group Date: 09/27/2022 Start Time: 1040 End Time: 1110 Facilitators: Minh Jasper-McCall, Kelly Carr,Kelly Carr Location: [address] Dayroom   Goal Area(s) Addresses:  Patient will effectively listen to complete activity.  Patient will identify communication skills used to make activity successful.  Patient will identify how skills used during activity can be used to reach post d/c goals.    Group Description: Geometric Drawings.  Three volunteers from the peer group will be shown an abstract picture with a particular arrangement of geometrical shapes.  Each round, one 'speaker' will describe the pattern, as accurately as possible without revealing the image to the group.  The remaining group members will listen and draw the picture to reflect how it is described to them. Patients with the role of 'listener' cannot ask clarifying questions but, may request that the speaker repeat a direction. Once the drawings are complete, the presenter will show the rest of the group the picture and compare how close each person came to drawing the picture. Kelly Carr will facilitate a post-activity discussion regarding effective communication and the importance of planning, listening, and asking for clarification in daily interactions with others.   Affect/Mood: Appropriate   Participation Level: Engaged   Participation Quality: Independent   Behavior: Appropriate   Speech/Thought Process: Focused   Insight: Good   Judgement: Good   Modes of Intervention: Activity   Patient Response to Interventions:  Engaged   Education Outcome:  In group clarification offered    Clinical Observations/Individualized Feedback: Pt was bright and engaged during activity.  Pt stated one form of communication is eye contact.  Pt also expressed the pitch of your voice can affect the way a person interprets what you are saying to them.  Pt was the first presenter and did a  good job being descriptive in descriptions.  Pt went on to express that ultimately, "the way you interact can give have a person believe you're not interested in what they are saying when you just have something on your mind".    Plan: Continue to engage patient in RT group sessions 2-3x/week.   Kelly Carr, Kelly Carr,Kelly Carr 09/27/2022 12:16 PM

## 2022-09-28 ENCOUNTER — Encounter (HOSPITAL_COMMUNITY): Payer: Self-pay

## 2022-09-28 DIAGNOSIS — F312 Bipolar disorder, current episode manic severe with psychotic features: Secondary | ICD-10-CM | POA: Diagnosis not present

## 2022-09-28 LAB — IRON,TIBC AND FERRITIN PANEL
Ferritin: 54 ng/mL (ref 15–150)
Iron Saturation: 10 % — ABNORMAL LOW (ref 15–55)
Iron: 27 ug/dL (ref 27–159)
Total Iron Binding Capacity: 280 ug/dL (ref 250–450)
UIBC: 253 ug/dL (ref 131–425)

## 2022-09-28 LAB — SPECIMEN STATUS REPORT

## 2022-09-28 NOTE — Group Note (Signed)
Date:  09/28/2022 Time:  8:41 PM     Participation Level:  Active  Participation Quality:  Appropriate, Attentive, Sharing, and Supportive  Affect:  Excited  Cognitive:  Alert and Appropriate  Insight: Appropriate and Good  Engagement in Group:  Distracting, Engaged, and Supportive  Modes of Intervention:  Discussion and Education  Additional Comments:  Pt attended and participated in wrap up group this evening and rated their day a 10/10. Pt stated that they are excited about D/C tomorrow. Pt plan for when they D/C is to focus on themself, God and to "trust the process".   Chrisandra Netters 09/28/2022, 8:41 PM
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
## 2022-09-28 NOTE — Discharge Summary (Signed)
Physician Discharge Summary Note  Patient:  Kelly Carr is an 52 y.o., adult MRN:  [id number] DOB:  [name] 21, 1970 Patient phone:  [phone] (home)  Patient address:   [address] [place] [address],  Total Time spent with patient: 30 minutes  Date of Admission:  09/21/2022 Date of Discharge: 09/29/2022  Reason for Admission:  Kelly Carr is a 52 y.o., adult with a past psychiatric history significant for bipolar disorder and marijuana use disorder who presents to the [facility] from [facility] for evaluation and management of mania and grandiose delusions.  According to outside records, the patient presented to [facility] under IVC started by law enforcement for patient acting bizarre.  In the emergency room patient was noted to present disorganized and delusional with grandiose delusions noting that she is engaged to "[name]" who is a famous singer.    Principal Problem: Severe manic bipolar 1 disorder with psychotic behavior Discharge Diagnoses: Principal Problem:   Severe manic bipolar 1 disorder with psychotic behavior Active Problems:   Cannabis abuse   Cocaine use disorder, mild, abuse   Past Psychiatric History: Please see H&P.  Past Medical History:  Past Medical History:  Diagnosis Date   Abnormal Pap smear    Unknown results>colpo>normal   Abscess 10/14/2021   Anxiety    Arthritis    Asthma    Bipolar 1 disorder    Cervical strain 05/04/2021   Depression    Forearm strain, left, initial encounter 05/04/2021   Labral tear of hip joint 12/27/2014   Orbital floor (blow-out) closed fracture 10/17/2018   Orbital floor (blow-out) closed fracture 10/17/2018   Paresthesia and pain of extremity 04/07/2021   PTSD (post-traumatic stress disorder)    Rotator cuff tendinitis, right 05/04/2021    Past Surgical History:  Procedure Laterality Date   NO PAST SURGERIES     Family History:  Family History  Problem Relation Age of  Onset   Diabetes Mother    Hypertension Mother    Heart disease Mother 74   Schizophrenia Mother    Diabetes Maternal Grandmother    Heart disease Maternal Grandmother    Diabetes Maternal Grandfather    Heart disease Maternal Grandfather    Depression Daughter    Bipolar disorder Cousin    Bipolar disorder Nephew    Family Psychiatric  History: Please see H&P Social History:  Social History   Substance and Sexual Activity  Alcohol Use Not Currently     Social History   Substance and Sexual Activity  Drug Use Not Currently   Types: Marijuana   Comment: No pot in over a yr. 08/23/2022    Social History   Socioeconomic History   Marital status: Single    Spouse name: Not on file   Number of children: Not on file   Years of education: Not on file   Highest education level: Not on file  Occupational History   Not on file  Tobacco Use   Smoking status: Every Day    Types: E-cigarettes   Smokeless tobacco: Never  Vaping Use   Vaping Use: Every day   Substances: Nicotine, Flavoring  Substance and Sexual Activity   Alcohol use: Not Currently   Drug use: Not Currently    Types: Marijuana    Comment: No pot in over a yr. 08/23/2022   Sexual activity: Yes    Partners: Male    Birth control/protection: None  Other Topics Concern   Not on  file  Social History Narrative   Not on file   Social Determinants of Health   Financial Resource Strain: Not on file  Food Insecurity: No Food Insecurity (09/21/2022)   Hunger Vital Sign    Worried About Running Out of Food in the Last Year: Never true    Ran Out of Food in the Last Year: Never true  Transportation Needs: No Transportation Needs (09/21/2022)   PRAPARE - Administrator, Civil Service (Medical): No    Lack of Transportation (Non-Medical): No  Physical Activity: Not on file  Stress: Not on file  Social Connections: Not on file    Hospital Course:  During the patient's hospitalization, patient had  extensive initial psychiatric evaluation, and follow-up psychiatric evaluations every day.  Psychiatric diagnoses provided upon initial assessment: Bipolar disorder with psychotic features with severe mania.  Patient's psychiatric medications were adjusted on admission: Patient was started on Abilify 20 mg a day.  She was already taking Abilify Maintena 100 mg every 28 days.  Her next dose is on 10/07/2022.  During the hospitalization, other adjustments were made to the patient's psychiatric medication regimen: Abilify overall  Patient's care was discussed during the interdisciplinary team meeting every day during the hospitalization.  The patient denied having side effects to prescribed psychiatric medication.  Gradually, patient started adjusting to milieu. The patient was evaluated each day by a clinical provider to ascertain response to treatment. Improvement was noted by the patient's report of decreasing symptoms, improved sleep and appetite, affect, medication tolerance, behavior, and participation in unit programming.  Patient was asked each day to complete a self inventory noting mood, mental status, pain, new symptoms, anxiety and concerns.    Symptoms were reported as significantly decreased or resolved completely by discharge.   On day of discharge, the patient reports that their mood is stable. The patient denied having suicidal thoughts for more than 48 hours prior to discharge.  Patient denies having homicidal thoughts.  Patient denies having auditory hallucinations.  Patient denies any visual hallucinations or other symptoms of psychosis. The patient was motivated to continue taking medication with a goal of continued improvement in mental health.   The patient reports their target psychiatric symptoms of psychosis and mania responded well to the psychiatric medications, and the patient reports overall benefit other psychiatric hospitalization. Supportive psychotherapy was provided to  the patient. The patient also participated in regular group therapy while hospitalized. Coping skills, problem solving as well as relaxation therapies were also part of the unit programming.  Labs were reviewed with the patient, and abnormal results were discussed with the patient.  The patient is able to verbalize their individual safety plan to this provider.  # It is recommended to the patient to continue psychiatric medications as prescribed, after discharge from the hospital.    # It is recommended to the patient to follow up with your outpatient psychiatric provider and PCP.  # It was discussed with the patient, the impact of alcohol, drugs, tobacco have been there overall psychiatric and medical wellbeing, and total abstinence from substance use was recommended the patient.ed.  # Prescriptions provided or sent directly to preferred pharmacy at discharge. Patient agreeable to plan. Given opportunity to ask questions. Appears to feel comfortable with discharge.    # In the event of worsening symptoms, the patient is instructed to call the crisis hotline, 911 and or go to the nearest ED for appropriate evaluation and treatment of symptoms. To follow-up with primary  care provider for other medical issues, concerns and or health care needs  # Patient was discharged home with a plan to follow up as noted below.   Physical Findings: AIMS: Facial and Oral Movements Muscles of Facial Expression: None, normal Lips and Perioral Area: None, normal Jaw: None, normal Tongue: None, normal,Extremity Movements Upper (arms, wrists, hands, fingers): None, normal Lower (legs, knees, ankles, toes): None, normal, Trunk Movements Neck, shoulders, hips: None, normal, Overall Severity Severity of abnormal movements (highest score from questions above): None, normal Incapacitation due to abnormal movements: None, normal Patient's awareness of abnormal movements (rate only patient's report): No Awareness,  Dental Status Current problems with teeth and/or dentures?: No Does patient usually wear dentures?: No  CIWA:    COWS:     Musculoskeletal: Strength & Muscle Tone: within normal limits Gait & Station: normal Patient leans: N/A   Psychiatric Specialty Exam:  Presentation  General Appearance:  Casual  Eye Contact: Fair  Speech: Clear and Coherent  Speech Volume: Normal  Handedness: Right   Mood and Affect  Mood: Anxious; Labile  Affect: Labile   Thought Process  Thought Processes: Linear  Descriptions of Associations:Tangential  Orientation:Full (Time, Place and Person)  Thought Content:Rumination; Perseveration; Tangential  History of Schizophrenia/Schizoaffective disorder:No  Duration of Psychotic Symptoms:Greater than six months  Hallucinations:Hallucinations: None  Ideas of Reference:None  Suicidal Thoughts:Suicidal Thoughts: No  Homicidal Thoughts:Homicidal Thoughts: No   Sensorium  Memory: Immediate Fair; Remote Fair; Recent Fair  Judgment: Fair  Insight: Fair   Art therapist  Concentration: Fair  Attention Span: Fair  Recall: [employer] of Knowledge: Fair  Language: Fair   Psychomotor Activity  Psychomotor Activity: Psychomotor Activity: Normal   Assets  Assets: Desire for Improvement; Communication Skills   Sleep  Sleep: Sleep: Fair Number of Hours of Sleep: 5.5    Physical Exam: Physical Exam Constitutional:      Appearance: Normal appearance.  Neurological:     General: No focal deficit present.     Mental Status: She is alert and oriented to person, place, and time. Mental status is at baseline.  Psychiatric:        Mood and Affect: Mood normal.        Behavior: Behavior normal.    Review of Systems  Psychiatric/Behavioral: Negative.    All other systems reviewed and are negative.  Blood pressure 101/88, pulse 96, temperature 98.5 F (36.9 C), temperature source Oral, resp. rate  20, height  (1.626 m), weight 77.1 kg, SpO2 99 %. Body mass index is 29.18 kg/m.   Social History   Tobacco Use  Smoking Status Every Day   Types: E-cigarettes  Smokeless Tobacco Never   Tobacco Cessation:  A prescription for an FDA-approved tobacco cessation medication provided at discharge   Blood Alcohol level:  Lab Results  Component Value Date   [facility] <10 09/20/2022   ETH <10 09/15/2022    Metabolic Disorder Labs:  Lab Results  Component Value Date   HGBA1C 5.7 (H) 09/05/2022   MPG 111.15 02/04/2021   MPG 108.28 07/31/2020   Lab Results  Component Value Date   PROLACTIN 39.5 (H) 04/12/2018   PROLACTIN 2.0 12/05/2011   Lab Results  Component Value Date   CHOL 170 09/05/2022   TRIG 85 09/05/2022   HDL 45 09/05/2022   CHOLHDL 3.8 09/05/2022   VLDL 12 02/04/2021   LDLCALC 109 (H) 09/05/2022   LDLCALC 138 (H) 02/04/2021    See Psychiatric Specialty Exam and Suicide  Risk Assessment completed by Attending Physician prior to discharge.  Discharge destination:  Home  Is patient on multiple antipsychotic therapies at discharge:  No   Has Patient had three or more failed trials of antipsychotic monotherapy by history:  No  Recommended Plan for Multiple Antipsychotic Therapies: NA      Follow-up recommendations:  Activity:  AS tolerated  Comments: On admission the patient was very labile psychotic and grandiose.  She had racing thoughts and flight of ideas and was noted to have pressured speech.  She was quite delusional.  She lives in an apartment but her daughter had recently moved out.  Patient also had an soft tissue injury to her right ankle and was wearing a boot brace.  She was maintained on Abilify Maintena because her psychiatrist in [place] and her last injection was on 09/08/2022.  Signed: Rex Kras, MD 09/28/2022, 4:45 PM

## 2022-09-28 NOTE — [facility] Suicide Risk Assessment (Signed)
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 Active Problems:   Cannabis abuse   Cocaine use disorder, mild, abuse   Total Time spent with patient: 30 minutes  Musculoskeletal: Strength & Muscle Tone: {desc; muscle tone:32375} Gait & Station: {PE GAIT ED ZOXW:96045} Patient leans: {Patient Leans:[id number]}  Psychiatric Specialty Exam  Presentation  General Appearance:  Casual  Eye Contact: Fair  Speech: Clear and Coherent  Speech Volume: Normal  Handedness: Right   Mood and Affect  Mood: Anxious; Labile  Duration of Depression Symptoms: No data recorded Affect: Labile   Thought Process  Thought Processes: Linear  Descriptions of Associations:Tangential  Orientation:Full (Time, Place and Person)  Thought Content:Rumination; Perseveration; Tangential  History of Schizophrenia/Schizoaffective disorder:No  Duration of Psychotic Symptoms:Greater than six months  Hallucinations:Hallucinations: None  Ideas of Reference:None  Suicidal Thoughts:Suicidal Thoughts: No  Homicidal Thoughts:Homicidal Thoughts: No   Sensorium  Memory: Immediate Fair; Remote Fair; Recent Fair  Judgment: Fair  Insight: Fair   Art therapist  Concentration: Fair  Attention Span: Fair  Recall: [employer] of Knowledge: Fair  Language: Fair   Psychomotor Activity  Psychomotor Activity: Psychomotor Activity: Normal   Assets  Assets: Desire for Improvement; Communication Skills   Sleep  Sleep: Sleep: Fair Number of Hours of Sleep: 5.5   Physical Exam: Physical Exam Constitutional:      Appearance: Normal appearance.  Neurological:     Mental Status: She is alert and oriented to person, place, and time. Mental status is at baseline.  Psychiatric:        Mood and Affect:  Mood normal.        Behavior: Behavior normal.    ROS Blood pressure 101/88, pulse 96, temperature 98.5 F (36.9 C), temperature source Oral, resp. rate 20, height  (1.626 m), weight 77.1 kg, SpO2 99 %. Body mass index is 29.18 kg/m.  Mental Status Per Nursing Assessment::   On Admission:  NA  Demographic Factors:  Living alone  Loss Factors: NA  Historical Factors: Impulsivity  Risk Reduction Factors:   Positive social support and Positive therapeutic relationship  Continued Clinical Symptoms:  Bipolar Disorder:   Mixed State  Cognitive Features That Contribute To Risk:  None    Suicide Risk:  Mild:  Suicidal ideation of limited frequency, intensity, duration, and specificity.  There are no identifiable plans, no associated intent, mild dysphoria and related symptoms, good self-control (both objective and subjective assessment), few other risk factors, and identifiable protective factors, including available and accessible social support.    Plan Of Care/Follow-up recommendations:  Activity:  As tolerated  Rex Kras, MD 09/28/2022, 4:43 PM

## 2022-09-28 NOTE — Plan of Care (Signed)
  Problem: Education: Goal: Emotional status will improve Outcome: Progressing Goal: Mental status will improve Outcome: Progressing   Problem: Activity: Goal: Interest or engagement in activities will improve Outcome: Progressing   Problem: Coping: Goal: Ability to demonstrate self-control will improve Outcome: Progressing   

## 2022-09-28 NOTE — Group Note (Signed)
Recreation Therapy Group Note   Group Topic:Relaxation  Group Date: 09/28/2022 Start Time: 1000 End Time: 1052 Facilitators: Karson Reede-McCall, LRT,CTRS Location: [address] Dayroom   Goal Area(s) Addresses:  Patient will identify positive stress management techniques. Patient will identify benefits of using stress management post d/c.   Group Description:  Music Therapy.  LRT and patients discussed how music can be used to relax and calm you.  LRT let patients take turns picking songs they wanted to hear that helps them relax and be calm.  Patients were attentive and were able to express how the songs made them feel.   Affect/Mood: Appropriate and Anxious   Participation Level: Engaged   Participation Quality: Independent   Behavior: Appropriate   Speech/Thought Process: Focused   Insight: Good   Judgement: Good   Modes of Intervention: Music   Patient Response to Interventions:  Engaged   Education Outcome:  In group clarification offered    Clinical Observations/Individualized Feedback: Pt was very engaged.  Pt would dance and sing along to some of the songs.  Pt had moments of not being able to sit still when she wasn't dancing.  Pt would also engage with peers about the music, it's meanings and about the artist who do the songs.     Plan: Continue to engage patient in RT group sessions 2-3x/week.   Damarcus Reggio-McCall, LRT,CTRS 09/28/2022 2:38 PM

## 2022-09-28 NOTE — BH IP Treatment Plan (Signed)
Interdisciplinary Treatment and Diagnostic Plan Update  09/28/2022 Time of Session: 9:40 AM (UPDATE) Kelly Carr MRN: [id number]  Principal Diagnosis: Severe manic bipolar 1 disorder with psychotic behavior  Secondary Diagnoses: Principal Problem:   Severe manic bipolar 1 disorder with psychotic behavior Active Problems:   Cannabis abuse   Cocaine use disorder, mild, abuse   Current Medications:  Current Facility-Administered Medications  Medication Dose Route Frequency Provider Last Rate Last Admin   acetaminophen (TYLENOL) tablet 650 mg  650 mg Oral Q6H PRN Vanetta Mulders, NP   650 mg at 09/28/22 1251   ARIPiprazole (ABILIFY) tablet 20 mg  20 mg Oral Daily Attiah, Nadir, MD   20 mg at 09/28/22 0817   [START ON 10/07/2022] ARIPiprazole ER (ABILIFY MAINTENA) injection 400 mg  400 mg Intramuscular Q28 days Abbott Pao, Nadir, MD       diphenhydrAMINE (BENADRYL) capsule 50 mg  50 mg Oral TID PRN Vanetta Mulders, NP   50 mg at 09/27/22 2031   Or   diphenhydrAMINE (BENADRYL) injection 50 mg  50 mg Intramuscular TID PRN Vanetta Mulders, NP       hydrOXYzine (ATARAX) tablet 25 mg  25 mg Oral TID PRN Vanetta Mulders, NP   25 mg at 09/28/22 0818   magnesium hydroxide (MILK OF MAGNESIA) suspension 30 mL  30 mL Oral Daily PRN Vanetta Mulders, NP       nicotine polacrilex (NICORETTE) gum 2 mg  2 mg Oral PRN Armandina Stammer I, NP   2 mg at 09/28/22 1252   traZODone (DESYREL) tablet 200 mg  200 mg Oral QHS Abbott Pao, Nadir, MD   200 mg at 09/27/22 2031   PTA Medications: Facility-Administered Medications Prior to Admission  Medication Dose Route Frequency Provider Last Rate Last Admin   ARIPiprazole ER (ABILIFY MAINTENA) 400 MG prefilled syringe 400 mg  400 mg Intramuscular Q28 days Hurst, Teresa T, PA-C       Medications Prior to Admission  Medication Sig Dispense Refill Last Dose   albuterol (VENTOLIN HFA) 108 (90 Base) MCG/ACT inhaler Inhale 1-2 puffs into the lungs every 6 (six) hours  as needed for wheezing or shortness of breath.   Past Week   ARIPiprazole ER (ABILIFY MAINTENA) 400 MG SRER injection Inject 2 mLs (400 mg total) into the muscle every 28 (twenty-eight) days. 1 each 3 09/08/2022   cetirizine (ZYRTEC) 10 MG tablet Take 1 tablet (10 mg total) by mouth daily. 30 tablet 0 Past Month   diphenhydrAMINE (BENADRYL ALLERGY) 25 mg capsule Take 1 capsule (25 mg total) by mouth every 6 (six) hours as needed. 30 capsule 0 Past Month   hydrocortisone cream 1 % Apply 1 Application topically 2 (two) times daily as needed for itching.   Past Week   hydrOXYzine (ATARAX) 10 MG tablet Take 1-3 tablets (10-30 mg total) by mouth 3 (three) times daily as needed. 90 tablet 0 Past Week   ibuprofen (ADVIL) 600 MG tablet Take by mouth.      Multiple Vitamin (MULTIVITAMIN WITH MINERALS) TABS tablet Take 1 tablet by mouth daily.   Past Week    Patient Stressors: Health problems   Marital or family conflict    Patient Strengths: Motivation for treatment/growth  Supportive family/friends   Treatment Modalities: Medication Management, Group therapy, Case management,  1 to 1 session with clinician, Psychoeducation, Recreational therapy.   Physician Treatment Plan for Primary Diagnosis: Severe manic bipolar 1 disorder with psychotic behavior Long Term Goal(s): Improvement in symptoms  so as ready for discharge   Short Term Goals: Ability to identify changes in lifestyle to reduce recurrence of condition will improve Ability to verbalize feelings will improve Ability to disclose and discuss suicidal ideas Ability to demonstrate self-control will improve Ability to identify and develop effective coping behaviors will improve  Medication Management: Evaluate patient's response, side effects, and tolerance of medication regimen.  Therapeutic Interventions: 1 to 1 sessions, Unit Group sessions and Medication administration.  Evaluation of Outcomes: Progressing  Physician Treatment Plan for  Secondary Diagnosis: Principal Problem:   Severe manic bipolar 1 disorder with psychotic behavior Active Problems:   Cannabis abuse   Cocaine use disorder, mild, abuse  Long Term Goal(s): Improvement in symptoms so as ready for discharge   Short Term Goals: Ability to identify changes in lifestyle to reduce recurrence of condition will improve Ability to verbalize feelings will improve Ability to disclose and discuss suicidal ideas Ability to demonstrate self-control will improve Ability to identify and develop effective coping behaviors will improve     Medication Management: Evaluate patient's response, side effects, and tolerance of medication regimen.  Therapeutic Interventions: 1 to 1 sessions, Unit Group sessions and Medication administration.  Evaluation of Outcomes: Progressing   RN Treatment Plan for Primary Diagnosis: Severe manic bipolar 1 disorder with psychotic behavior Long Term Goal(s): Knowledge of disease and therapeutic regimen to maintain health will improve  Short Term Goals: Ability to remain free from injury will improve, Ability to verbalize frustration and anger appropriately will improve, Ability to participate in decision making will improve, Ability to verbalize feelings will improve, Ability to identify and develop effective coping behaviors will improve, and Compliance with prescribed medications will improve  Medication Management: RN will administer medications as ordered by provider, will assess and evaluate patient's response and provide education to patient for prescribed medication. RN will report any adverse and/or side effects to prescribing provider.  Therapeutic Interventions: 1 on 1 counseling sessions, Psychoeducation, Medication administration, Evaluate responses to treatment, Monitor vital signs and CBGs as ordered, Perform/monitor CIWA, COWS, AIMS and Fall Risk screenings as ordered, Perform wound care treatments as ordered.  Evaluation of  Outcomes: Progressing   LCSW Treatment Plan for Primary Diagnosis: Severe manic bipolar 1 disorder with psychotic behavior Long Term Goal(s): Safe transition to appropriate next level of care at discharge, Engage patient in therapeutic group addressing interpersonal concerns.  Short Term Goals: Engage patient in aftercare planning with referrals and resources, Increase social support, Increase emotional regulation, Facilitate acceptance of mental health diagnosis and concerns, Identify triggers associated with mental health/substance abuse issues, and Increase skills for wellness and recovery  Therapeutic Interventions: Assess for all discharge needs, 1 to 1 time with Social worker, Explore available resources and support systems, Assess for adequacy in community support network, Educate family and significant other(s) on suicide prevention, Complete Psychosocial Assessment, Interpersonal group therapy.  Evaluation of Outcomes: Progressing   Progress in Treatment: Attending groups: Yes. Participating in groups: Yes. Taking medication as prescribed: Yes. Toleration medication: Yes. Family/Significant other contact made: No, will contact:  Tiffany (Daughter) Patient understands diagnosis: Yes. Discussing patient identified problems/goals with staff: Yes. Medical problems stabilized or resolved: Yes. Denies suicidal/homicidal ideation: Yes. Issues/concerns per patient self-inventory: No.    New problem(s) identified: No, Describe:  None Reported   New Short Term/Long Term Goal(s): medication stabilization, elimination of SI thoughts, development of comprehensive mental wellness plan.     Patient Goals:  "Coping Skills/Medication Management"   Discharge Plan or Barriers: Patient  recently admitted. CSW will continue to follow and assess for appropriate referrals and possible discharge planning.     Reason for Continuation of Hospitalization: Anxiety Depression Mania Medication  stabilization Suicidal ideation Withdrawal symptoms   Estimated Length of Stay: 1-2 Days   Last 3 [place] Suicide Severity Risk Score: Flowsheet Row Ad[facility] (Current) [facility]20/2022 in [facility] Department [facility] ED from 09/18/2022 in [facility]  C-SSRS RISK CATEGORY No Risk High Risk No Risk       Last PHQ 2/9 Scores:    09/15/2022   10:27 AM 08/30/2022   11:25 AM 11/12/2021    8:42 AM  Depression screen PHQ 2/9  Decreased Interest 0 0 0  Down, Depressed, Hopeless 2 0 0  PHQ - 2 Score 2 0 0  Altered sleeping 0 1 0  Tired, decreased energy 1 3 0  Change in appetite 3 3 0  Feeling bad or failure about yourself  3 0 0  Trouble concentrating 0 0 0  Moving slowly or fidgety/restless 0 0 0  Suicidal thoughts 0 0 0  PHQ-9 Score 9 7 0    Scribe for Treatment Team: Beather Arbour 09/28/2022 4:50 PM

## 2022-09-29 ENCOUNTER — Telehealth: Payer: Self-pay | Admitting: *Deleted

## 2022-09-29 DIAGNOSIS — F312 Bipolar disorder, current episode manic severe with psychotic features: Secondary | ICD-10-CM | POA: Diagnosis not present

## 2022-09-29 MED ORDER — ARIPIPRAZOLE 20 MG PO TABS: 20.0000 mg | ORAL_TABLET | Freq: Every day | ORAL | 0 refills | Status: DC

## 2022-09-29 MED ORDER — TRAZODONE HCL 100 MG PO TABS: 200.0000 mg | ORAL_TABLET | Freq: Every day | ORAL | 0 refills | Status: DC

## 2022-09-29 MED ORDER — NICOTINE POLACRILEX 2 MG MT GUM: 2.0000 mg | CHEWING_GUM | OROMUCOSAL | 0 refills | Status: DC | PRN

## 2022-09-29 MED ORDER — HYDROXYZINE HCL 25 MG PO TABS: 25.0000 mg | ORAL_TABLET | Freq: Three times a day (TID) | ORAL | 0 refills | Status: DC | PRN

## 2022-09-29 NOTE — Progress Notes (Signed)
     09/29/22 0532  15 Minute Checks  Location Bedroom  Visual Appearance Calm  Behavior Sleeping  Sleep (Behavioral Health Patients Only)  Calculate sleep? (Click Yes once per 24 hr at 0600 safety check) Yes  Documented sleep last 24 hours 4

## 2022-09-29 NOTE — Progress Notes (Signed)
Recreation Therapy Notes  INPATIENT RECREATION TR PLAN  Patient Details Name: Kelly Carr MRN: [id number] DOB: 1970/06/16 Today's Date: 09/29/2022  Rec Therapy Plan Is patient appropriate for Therapeutic Recreation?: Yes Treatment times per week: about 3 days Estimated Length of Stay: 5-7 days TR Treatment/Interventions: Group participation (Comment)  Discharge Criteria Pt will be discharged from therapy if:: Discharged Treatment plan/goals/alternatives discussed and agreed upon by:: Patient/family  Discharge Summary Short term goals set: See patient care plan Short term goals met: Not met Progress toward goals comments: Groups attended Which groups?: Coping skills, Leisure education, Communication, Other (Comment) (Relaxation; Problem Solving) Reason goals not met: Pt unable to respect boundaries of others. Therapeutic equipment acquired: N/A Reason patient discharged from therapy: Discharge from hospital Pt/family agrees with progress & goals achieved: Yes Date patient discharged from therapy: 09/29/22   Lashawnta Burgert-McCall, LRT,CTRS Jasn Xia A Emika Tiano-McCall 09/29/2022, 1:02 PM

## 2022-09-29 NOTE — Plan of Care (Signed)
Patient was unable to identify or utilize any healthy boundaries during recreation therapy group sessions.   Tangi Shroff-McCall, LRT,CTRS

## 2022-09-29 NOTE — Telephone Encounter (Addendum)
Lelon Mast w/ police dept is calling for patient to ask if her short boot can be exchanged for a taller one,is rubbing against her chin, will be in on Monday to [place] location since [place] is closed.

## 2022-09-29 NOTE — BHH Suicide Risk Assessment (Signed)
[facility] INPATIENT:  Family/Significant Other Suicide Prevention Education  Suicide Prevention Education:  Contact Attempts: 09-21-22, Doyle Askew (Daughter) Curt Jews) Daughter [phone] has been identified by the patient as the family member/significant other with whom the patient will be residing, and identified as the person(s) who will aid the patient in the event of a mental health crisis.  With written consent from the patient, two attempts were made to provide suicide prevention education, prior to and/or following the patient's discharge.  We were unsuccessful in providing suicide prevention education.  A suicide education pamphlet was given to the patient to share with family/significant other.  Date and time of first attempt:09-21-2022/348p Date and time of second attempt:09-28-2022/127  Raad Clayson S Tamaira Ciriello 09/29/2022, 10:30 AM

## 2022-09-29 NOTE — BHH Counselor (Signed)
09/29/2022  Kelly Carr DOB: [birth year] MRN: [id number]   RIDER WAIVER AND RELEASE OF LIABILITY  For the purposes of helping with transportation needs, [facility] partners with outside transportation providers (taxi companies, [place], Catering manager.) to give [employer] patients or other approved people the choice of on-demand rides [employer]") to our buildings for non-emergency visits.  By using [employer], I, the person signing this document, on behalf of myself and/or any legal minors (in my care using the [employer]), agree:  Science writer given to me are supplied by independent, outside transportation providers who do not work for, or have any affiliation with, [employer]. [place] is not a transportation company. [place] has no control over the quality or safety of the rides I get using [employer]. [place] has no control over whether any outside ride will happen on time or not. [place] gives no guarantee on the reliability, quality, safety, or availability on any rides, or that no mistakes will happen. I know and accept that traveling by vehicle (car, truck, SVU, Zenaida Niece, bus, taxi, etc.) has risks of serious injuries such as disability, being paralyzed, and death. I know and agree the risk of using [employer] is mine alone, and not [employer]. Transport Services are provided "as is" and as are available. The transportation providers are in charge for all inspections and care of the vehicles used to provide these rides. I agree not to take legal action against [place], its agents, employees, officers, directors, representatives, insurers, attorneys, assigns, successors, subsidiaries, and affiliates at any time for any reasons related directly or indirectly to using [employer]. I also agree not to take legal action against [place] or its affiliates for any injury, death, or damage to property caused by or related to using  [employer]. I have read this Waiver and Release of Liability, and I understand the terms used in it and their legal meaning. This Waiver is freely and voluntarily given with the understanding that my right (or any legal minors) to legal action against  relating to [employer] is knowingly given up to use these services.   I attest that I read the Ride Waiver and Release of Liability to [name], gave [name] the opportunity to ask questions and answered the questions asked (if any). I affirm that [name] then provided consent for assistance with transportation.

## 2022-09-29 NOTE — Progress Notes (Signed)
Adult Psychoeducational Group Note  Date:  09/29/2022 Time:  9:50 AM  Group Topic/Focus:  Goals Group:   The focus of this group is to help patients establish daily goals to achieve during treatment and discuss how the patient can incorporate goal setting into their daily lives to aide in recovery.  Participation Level:  Active  Participation Quality:  Appropriate  Affect:  Appropriate  Cognitive:  Appropriate  Insight: Appropriate  Engagement in Group:  Engaged  Modes of Intervention:  Discussion  Additional Comments: The patient engaged in grouip  Octavio Manns 09/29/2022, 9:50 AM

## 2022-09-29 NOTE — Progress Notes (Signed)
Pt discharged to lobby. Pt was stable and appreciative at that time. All papers and prescriptions were given and valuables returned. Verbal understanding expressed. Denies SI/HI and A/VH. Pt given opportunity to express concerns and ask questions.  

## 2022-09-29 NOTE — Progress Notes (Signed)
  [facility] Adult Case Management Discharge Plan :  Will you be returning to the same living situation after discharge:  Yes,  pt will return to her private residence At discharge, do you have transportation home?: No.Taxi Do you have the ability to pay for your medications: Yes,  insured  Release of information consent forms completed and in the chart;  Patient'Kelly signature needed at discharge.  Patient to Follow up at:  Follow-up Information     [facility]. Go in 1 day(Kelly).   Why: Please arived at you scheduled appointment @ 3pm Contact information: [address] [place] [address]        Group, [facility] .   Specialty: [name] information: [address] [place] [address] [phone]                 Next level of care provider has access to [facility] Link:yes  Safety Planning and Suicide Prevention discussed: No. Several contact attempts were unsuccessful     Has patient been referred to the Quitline?: Patient refused referral  Patient has been referred for addiction treatment: Yes Patient to continue working towards treatment goals after discharge. Patient no longer meets criteria for inpatient criteria per attending physician. Continue taking medications as prescribed, nursing to provide instructions at discharge. Follow up with all scheduled appointments.   Kelly Carr Kelly Kelty Szafran, LCSW 09/29/2022, 10:40 AM

## 2022-09-30 ENCOUNTER — Ambulatory Visit: Payer: Medicaid Other | Admitting: Physician Assistant

## 2022-10-04 ENCOUNTER — Emergency Department (HOSPITAL_COMMUNITY): Payer: BLUE CROSS/BLUE SHIELD

## 2022-10-04 ENCOUNTER — Ambulatory Visit: Payer: Self-pay | Admitting: Physician Assistant

## 2022-10-04 ENCOUNTER — Ambulatory Visit: Payer: Medicaid Other

## 2022-10-04 ENCOUNTER — Emergency Department (HOSPITAL_COMMUNITY)
Admission: EM | Admit: 2022-10-04 | Discharge: 2022-10-04 | Disposition: A | Discharge status: Home / Self Care | Payer: BLUE CROSS/BLUE SHIELD | Attending: Emergency Medicine | Admitting: Emergency Medicine

## 2022-10-04 ENCOUNTER — Other Ambulatory Visit: Payer: Self-pay

## 2022-10-04 ENCOUNTER — Encounter (HOSPITAL_COMMUNITY): Payer: Self-pay

## 2022-10-04 DIAGNOSIS — M79671 Pain in right foot: Secondary | ICD-10-CM | POA: Insufficient documentation

## 2022-10-04 DIAGNOSIS — I10 Essential (primary) hypertension: Secondary | ICD-10-CM | POA: Insufficient documentation

## 2022-10-04 MED ORDER — ACETAMINOPHEN 500 MG PO TABS
1000.0000 mg | ORAL_TABLET | ORAL | Status: AC
  Administered 2022-10-04: 1000 mg via ORAL
  Filled 2022-10-04: qty 2

## 2022-10-04 NOTE — ED Triage Notes (Signed)
Pt to ED via EMS from [facility]. Pt states she was walking around all night and c/o right foot pain. Pt states she has a hx of plantar fasciitis. Pt ambulatory upon arrival to ED.

## 2022-10-04 NOTE — ED Provider Notes (Signed)
[place] EMERGENCY DEPARTMENT AT [facility] Provider Note   CSN: [id number] Arrival date & time: 10/04/22  4098     History Chief Complaint  Patient presents with   Foot Pain    Kelly Carr is a 52 y.o. adult with h/o HTN, bipolar 1, plantar fascitis, presents to the ER today for evaluation of her chronic right foot pain. The patient follows up with podiatry for this issue. She was given a CAM boot earlier this month for suspect plantar fasciitis rupture. The patient reports that she has not been wearing the boot because it has been rubbing the anterior part of her lower leg and causing pain. She has a follow up with podiatry today but reports she couldn't wait. She has not tried any medication for pain. Denies any numbness or tingling in the foot. The patient reports that she was walking tonight helping a friend move as well. Denies any new trauma to the foot. Denies any worsening of pain.    Foot Pain       Home Medications Prior to Admission medications   Medication Sig Start Date End Date Taking? Authorizing Provider  albuterol (VENTOLIN HFA) 108 (90 Base) MCG/ACT inhaler Inhale 1-2 puffs into the lungs every 6 (six) hours as needed for wheezing or shortness of breath.    [provider]  ARIPiprazole (ABILIFY) 20 MG tablet Take 1 tablet (20 mg total) by mouth daily. 09/29/22   Rex Kras, MD  ARIPiprazole ER (ABILIFY MAINTENA) 400 MG SRER injection Inject 2 mLs (400 mg total) into the muscle every 28 (twenty-eight) days. 06/15/20   [name], MD  cetirizine (ZYRTEC) 10 MG tablet Take 1 tablet (10 mg total) by mouth daily. 09/15/22   Cora Collum, DO  fluticasone (FLONASE) 50 MCG/ACT nasal spray SPRAY 2 SPRAYS INTO EACH NOSTRIL EVERY DAY 09/21/22   Sabino Dick, DO  hydrocortisone cream 1 % Apply 1 Application topically 2 (two) times daily as needed for itching.    [provider]  hydrOXYzine (ATARAX) 25 MG tablet Take 1  tablet (25 mg total) by mouth 3 (three) times daily as needed for anxiety. 09/29/22   Rex Kras, MD  nicotine polacrilex (NICORETTE) 2 MG gum Take 1 each (2 mg total) by mouth as needed for smoking cessation. 09/29/22   Rex Kras, MD  traZODone (DESYREL) 100 MG tablet Take 2 tablets (200 mg total) by mouth at bedtime. 09/29/22   Rex Kras, MD      Allergies    Doxycycline, Celebrex [celecoxib], Gabapentin, Haldol [haloperidol lactate], Prednisone, Risperidone and related, Tramadol, and Valproic acid    Review of Systems   Review of Systems  Constitutional:  Negative for chills and fever.  Musculoskeletal:  Positive for arthralgias.  Neurological:  Negative for numbness.    Physical Exam Updated Vital Signs BP 116/70   Pulse 80   Temp 98 F (36.7 C) (Oral)   Resp 16   Ht 5\' 4"  (1.626 m)   Wt 74.8 kg   SpO2 99%   BMI 28.32 kg/m  Physical Exam Vitals and nursing note reviewed.  Constitutional:      General: She is not in acute distress.    Appearance: Normal appearance. She is not toxic-appearing.     Comments: Somnolent, but wakes to voice.   Eyes:     General: No scleral icterus. Pulmonary:     Effort: Pulmonary effort is normal. No respiratory distress.  Musculoskeletal:  General: Tenderness present.     Comments: Tenderness to the plantar aspect of the right foot. No swelling, deformity, discoloration, erythema, or rash noted to the area. Palpable DP and PT pulses. Compartments are soft. Cap refill brisk. Dorsiflexion and plantar flexion present but with pain. Sensation reportedly intact and symmetric. Very superficial and scabbed abrasion seen to the anterior shin. No surrounding erythema, induration, or fluctuance.  No foreign body seen.  Skin:    General: Skin is warm and dry.  Neurological:     General: No focal deficit present.     Mental Status: She is alert. Mental status is at baseline.     ED Results / Procedures / Treatments    Labs (all labs ordered are listed, but only abnormal results are displayed) Labs Reviewed - No data to display  EKG None  Radiology DG Foot Complete Right  Result Date: 10/04/2022 CLINICAL DATA:  right foot pain EXAM: RIGHT FOOT COMPLETE - 3 VIEW COMPARISON:  Foot radiograph 05/23/2022 FINDINGS: There is no evidence of fracture or dislocation. There is no evidence of arthropathy or other focal bone abnormality. Bidirectional calcaneal enthesophytes. Soft tissues are unremarkable. IMPRESSION: No acute osseous abnormality. Electronically Signed   By: Lorenza Cambridge M.D.   On: 10/04/2022 07:39    Procedures Procedures   Medications Ordered in ED Medications  acetaminophen (TYLENOL) tablet 1,000 mg (1,000 mg Oral Given 10/04/22 0846)    ED Course/ Medical Decision Making/ A&P                            Medical Decision Making Amount and/or Complexity of Data Reviewed Radiology: ordered.  Risk OTC drugs.   52 y.o. adult presents to the ER today for evaluation of chronci right foot pain. Differential diagnosis includes but is not limited to plantar fasciitis, chronic right foot pain, foreign body, gout, fracture. Vital signs unremarkable. Physical exam as noted above.   On previous chart evaluation, patient was seen earlier [place] by Dr. Al Corpus with podiatry.  He suspected she had a plantar fasciitis rupture and put her in a cam boot.  Patient has not been wearing the cam boot because she reports that it has been rubbing the front of her skin and bothering her so she has not been wearing it.  She is was to see the podiatrist today for a bigger boot however reports that she could not wait.  No worsening pain just her continued chronic pain.  She has not tried any medication for this.  Will order x-ray as well as acetaminophen.  I went ahead and ordered her a larger cam boot as well.  XR shows There is no evidence of fracture or dislocation. There is no evidence of arthropathy or other  focal bone abnormality. Bidirectional calcaneal enthesophytes. Soft tissues are unremarkable.   Cam boot ordered in place.  Tylenol given.  Given her unchanged x-ray, and reassuring physical exam close likely her chronic pain.  Recommend patient attend her podiatry office today at her scheduled appointment.  We discussed plan at bedside. We discussed strict return precautions and red flag symptoms. The patient verbalized their understanding and agrees to the plan. The patient is stable and being discharged home in good condition.  Portions of this report may have been transcribed using voice recognition software. Every effort was made to ensure accuracy; however, inadvertent computerized transcription errors may be present.   Final Clinical Impression(s) / ED Diagnoses Final diagnoses:  Right foot pain    Rx / DC Orders ED Discharge Orders     None         Achille Rich, Cordelia Poche 10/04/22 1948    Benjiman Core, MD 10/05/22 [phone]

## 2022-10-04 NOTE — Progress Notes (Signed)
No show

## 2022-10-04 NOTE — Discharge Instructions (Addendum)
You were seen here today for evaluation of your chronic right foot pain. Please follow up with your podiatrist and attend your follow up appointment. Make sure you are wearing your CAM boot. Please make sure to take 1000mg  of Tylenol every 6 hours as needed for pain. If you have any concerns, new or worsening symptoms, please return to the nearest ER for evaluation.   Contact a health care provider if: Your pain does not get better after a few days of treatment at home. Your pain gets worse. You cannot stand on your foot. Your foot or toes are swollen. Your foot is numb or tingling. Get help right away if: Your foot or toes turn white or blue. You have warmth and redness along your foot.

## 2022-10-04 NOTE — ED Notes (Signed)
Ortho tech called for cam boot placement

## 2022-10-05 ENCOUNTER — Emergency Department
Admission: EM | Admit: 2022-10-05 | Discharge: 2022-10-06 | Disposition: A | Discharge status: Home / Self Care | Payer: BLUE CROSS/BLUE SHIELD | Attending: Emergency Medicine | Admitting: Emergency Medicine

## 2022-10-05 ENCOUNTER — Emergency Department: Payer: BLUE CROSS/BLUE SHIELD

## 2022-10-05 ENCOUNTER — Other Ambulatory Visit: Payer: Self-pay

## 2022-10-05 ENCOUNTER — Encounter: Payer: Self-pay | Admitting: Emergency Medicine

## 2022-10-05 DIAGNOSIS — L739 Follicular disorder, unspecified: Secondary | ICD-10-CM | POA: Diagnosis not present

## 2022-10-05 DIAGNOSIS — R404 Transient alteration of awareness: Secondary | ICD-10-CM

## 2022-10-05 DIAGNOSIS — R21 Rash and other nonspecific skin eruption: Secondary | ICD-10-CM | POA: Diagnosis present

## 2022-10-05 DIAGNOSIS — R464 Slowness and poor responsiveness: Secondary | ICD-10-CM | POA: Insufficient documentation

## 2022-10-05 DIAGNOSIS — R400 Somnolence: Secondary | ICD-10-CM | POA: Insufficient documentation

## 2022-10-05 LAB — URINALYSIS, ROUTINE W REFLEX MICROSCOPIC
Bilirubin Urine: NEGATIVE
Glucose, UA: NEGATIVE mg/dL
Hgb urine dipstick: NEGATIVE
Ketones, ur: NEGATIVE mg/dL
Nitrite: NEGATIVE
Protein, ur: 30 mg/dL — AB
Specific Gravity, Urine: 1.031 — ABNORMAL HIGH (ref 1.005–1.030)
pH: 5 (ref 5.0–8.0)

## 2022-10-05 LAB — ETHANOL: Alcohol, Ethyl (B): <10 mg/dL (ref <10)

## 2022-10-05 LAB — URINE DRUG SCREEN, QUALITATIVE (ARMC ONLY)
Amphetamines, Ur Screen: NOT DETECTED
Barbiturates, Ur Screen: NOT DETECTED
Benzodiazepine, Ur Scrn: NOT DETECTED
Cannabinoid 50 Ng, Ur ~~LOC~~: POSITIVE — AB
Cocaine Metabolite,Ur ~~LOC~~: NOT DETECTED
MDMA (Ecstasy)Ur Screen: NOT DETECTED
Methadone Scn, Ur: NOT DETECTED
Opiate, Ur Screen: NOT DETECTED
Phencyclidine (PCP) Ur S: NOT DETECTED
Tricyclic, Ur Screen: POSITIVE — AB

## 2022-10-05 LAB — ACETAMINOPHEN LEVEL: Acetaminophen (Tylenol), Serum: <10 ug/mL — ABNORMAL LOW (ref 10–30)

## 2022-10-05 LAB — TROPONIN I (HIGH SENSITIVITY): Troponin I (High Sensitivity): 5 ng/L (ref <18)

## 2022-10-05 LAB — SALICYLATE LEVEL: Salicylate Lvl: <7 mg/dL — ABNORMAL LOW (ref 7.0–30.0)

## 2022-10-05 MED ORDER — SODIUM CHLORIDE 0.9 % IV BOLUS
1000.0000 mL | Freq: Once | INTRAVENOUS | Status: AC
  Administered 2022-10-05: 1000 mL via INTRAVENOUS

## 2022-10-05 MED ORDER — NALOXONE HCL 4 MG/0.1ML NA LIQD
1.0000 | Freq: Once | NASAL | Status: AC
  Administered 2022-10-05: 1 via NASAL
  Filled 2022-10-05: qty 4

## 2022-10-05 MED ORDER — ONDANSETRON HCL 4 MG/2ML IJ SOLN
4.0000 mg | Freq: Once | INTRAMUSCULAR | Status: DC
  Filled 2022-10-05: qty 2

## 2022-10-05 MED ORDER — SULFAMETHOXAZOLE-TRIMETHOPRIM 800-160 MG PO TABS: 1.0000 | ORAL_TABLET | Freq: Two times a day (BID) | ORAL | 0 refills | Status: DC
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
## 2022-10-05 NOTE — ED Triage Notes (Signed)
First nurse note: Arrived by EMS from home. Reports having episode of LOC Saturday. Reports since waking up on the ground, having hives.   Reports she also has blisters on bilateral bottoms of feet  Seen yesterday at [facility] ED
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
## 2022-10-06 NOTE — ED Notes (Signed)
Patient discharged at this time. Ambulated to lobby with independent and steady gait. Breathing unlabored speaking in full sentences. Verbalized understanding of all discharge, follow up, and medication teaching. Discharged homed with all belongings.   

## 2022-10-11 ENCOUNTER — Telehealth: Payer: Self-pay | Admitting: Physician Assistant

## 2022-10-11 ENCOUNTER — Encounter: Payer: Self-pay | Admitting: Emergency Medicine

## 2022-10-11 ENCOUNTER — Other Ambulatory Visit: Payer: Self-pay

## 2022-10-11 ENCOUNTER — Emergency Department
Admission: EM | Admit: 2022-10-11 | Discharge: 2022-10-11 | Disposition: A | Discharge status: Home / Self Care | Payer: BLUE CROSS/BLUE SHIELD | Attending: Emergency Medicine | Admitting: Emergency Medicine

## 2022-10-11 DIAGNOSIS — M79661 Pain in right lower leg: Secondary | ICD-10-CM | POA: Diagnosis present

## 2022-10-11 DIAGNOSIS — M79662 Pain in left lower leg: Secondary | ICD-10-CM | POA: Diagnosis not present

## 2022-10-11 DIAGNOSIS — M79605 Pain in left leg: Secondary | ICD-10-CM

## 2022-10-11 NOTE — ED Triage Notes (Signed)
Patient to ED for bilateral feet pain. Patient states she tore a tendon in right foot- states she needs an MRI but hasn't been able to follow up. Had boots on foot but has rubbed leg. Right foot hurting as well- had shots in both feet.   Patient also requesting Abilify shot- was due 5/4. Also, needing refill on anxiety meds.

## 2022-10-11 NOTE — ED Notes (Signed)
Pt provided phone to call ride/md office as requested.
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
## 2022-10-11 NOTE — Telephone Encounter (Signed)
Pt called and asking for a refill on her anxiety medication

## 2022-10-11 NOTE — Discharge Instructions (Signed)
Follow-up with [facility].  Return to the emergency department for any worsening symptoms.  You can take Tylenol as needed for your foot pain.  Pain control:  Ibuprofen (motrin/aleve/advil) - You can take 3-4 tablets (600-800 mg) every 6 hours as needed for pain/fever.  Acetaminophen (tylenol) - You can take 2 extra strength tablets (1000 mg) every 6 hours as needed for pain/fever.  You can alternate these medications or take them together.  Make sure you eat food/drink water when taking these medications.

## 2022-10-11 NOTE — ED Provider Notes (Addendum)
Shared visit  Patient came into the emergency department for pain on her foot.  States that she has been walking a lot needing pain medication.  Patient is very somnolent but easily arousable.  Pinpoints pupils but normal respiratory drive.  No acute fracture or dislocation concern for her lower extremity.  On chart review patient had a history of similar episode where she was evaluated last week, given Narcan with improvement of her symptoms.  Patient denying any SI or HI.  Do not feel that the patient needs emergent evaluation by psychiatry at this time.  Does not meet criteria for an involuntary commitment.  Discussed Tylenol for pain control.  Discussed the need to follow-up with [facility] as an outpatient and she stated that she could give them a call and they could see her and discuss possibly getting a ride.  Given return precautions for any worsening symptoms.   Corena Herter, MD 10/11/22 1425    Corena Herter, MD 11/13/22 [phone]

## 2022-10-12 ENCOUNTER — Ambulatory Visit: Payer: BLUE CROSS/BLUE SHIELD

## 2022-10-12 ENCOUNTER — Ambulatory Visit (INDEPENDENT_AMBULATORY_CARE_PROVIDER_SITE_OTHER): Payer: BLUE CROSS/BLUE SHIELD

## 2022-10-12 ENCOUNTER — Other Ambulatory Visit: Payer: Self-pay

## 2022-10-12 ENCOUNTER — Ambulatory Visit: Payer: BLUE CROSS/BLUE SHIELD | Admitting: Family Medicine

## 2022-10-12 DIAGNOSIS — F3163 Bipolar disorder, current episode mixed, severe, without psychotic features: Secondary | ICD-10-CM | POA: Diagnosis not present

## 2022-10-12 DIAGNOSIS — F319 Bipolar disorder, unspecified: Secondary | ICD-10-CM | POA: Diagnosis not present

## 2022-10-12 DIAGNOSIS — F312 Bipolar disorder, current episode manic severe with psychotic features: Secondary | ICD-10-CM | POA: Diagnosis not present

## 2022-10-12 MED ORDER — HYDROXYZINE HCL 25 MG PO TABS: 25.0000 mg | ORAL_TABLET | Freq: Three times a day (TID) | ORAL | 0 refills | Status: DC | PRN

## 2022-10-12 NOTE — Telephone Encounter (Signed)
Sent in per med list. 
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
## 2022-10-13 ENCOUNTER — Encounter: Payer: Self-pay | Admitting: Physician Assistant

## 2022-10-13 ENCOUNTER — Ambulatory Visit (INDEPENDENT_AMBULATORY_CARE_PROVIDER_SITE_OTHER): Payer: BLUE CROSS/BLUE SHIELD | Admitting: Physician Assistant

## 2022-10-13 DIAGNOSIS — F411 Generalized anxiety disorder: Secondary | ICD-10-CM | POA: Diagnosis not present

## 2022-10-13 DIAGNOSIS — F312 Bipolar disorder, current episode manic severe with psychotic features: Secondary | ICD-10-CM | POA: Diagnosis not present

## 2022-10-13 DIAGNOSIS — G4700 Insomnia, unspecified: Secondary | ICD-10-CM

## 2022-10-13 DIAGNOSIS — F4310 Post-traumatic stress disorder, unspecified: Secondary | ICD-10-CM | POA: Diagnosis not present

## 2022-10-13 DIAGNOSIS — F1210 Cannabis abuse, uncomplicated: Secondary | ICD-10-CM

## 2022-10-13 MED ORDER — LITHIUM CARBONATE 300 MG PO TABS: 300.0000 mg | ORAL_TABLET | Freq: Every day | ORAL | 0 refills | Status: DC

## 2022-10-13 MED ORDER — HYDROXYZINE HCL 25 MG PO TABS: 25.0000 mg | ORAL_TABLET | Freq: Three times a day (TID) | ORAL | 1 refills | Status: DC | PRN
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
## 2022-10-18 ENCOUNTER — Ambulatory Visit: Payer: Self-pay

## 2022-10-18 NOTE — Telephone Encounter (Signed)
  Chief Complaint: Spider bite Symptoms: pain redness itching Frequency: last night or night before Pertinent Negatives: Patient denies  Disposition: [] ED /[x] Urgent Care (no appt availability in office) / [x] Appointment(In office/virtual)/ []  [place] [facility]/ [] Home Care/ [] Refused Recommended Disposition /[] [place] [facility]/ [x]  Follow-up with PCP Additional Notes: Pt thinks she was bitten by a spider. Pt states she has pain and swelling. Bite to the neck - Allergic reaction. Pt used benadryl last night with good results. Pt wants to establish with a provider closer to her home. Pt will call her PC now to get same day appt. Or go to UC if not able to be seen. Pt wil search [facility] website for help in locating a new PCP closer to home. Pt will call back if needed.    Reason for Disposition  [1] SEVERE local itching (i.e., interferes with work, school, sleep) AND [2] not improved after 24 hours of hydrocortisone cream  Answer Assessment - Initial Assessment Questions 1. TYPE of INSECT: "What type of insect was it?"      Spider bite on neck 2. ONSET: "When did you get bitten?"      Last night or the night before 3. LOCATION: "Where is the insect bite located?"      Neck 4. REDNESS: "Is the area red or pink?" If Yes, ask: "What size is area of redness?" (inches or cm). "When did the redness start?"     yes 5. PAIN: "Is there any pain?" If Yes, ask: "How bad is it?"  (Scale 1-10; or mild, moderate, severe)     yes 6. ITCHING: "Does it itch?" If Yes, ask: "How bad is the itch?"    - MILD: doesn't interfere with normal activities   - MODERATE-SEVERE: interferes with work, school, sleep, or other activities      yes 7. SWELLING: "How big is the swelling?" (inches, cm, or compare to coins)      8. OTHER SYMPTOMS: "Do you have any other symptoms?"  (e.g., difficulty breathing, hives)     Swelling  Protocols used: Insect Bite-A-AH

## 2022-10-20 ENCOUNTER — Telehealth: Payer: Self-pay | Admitting: Podiatry

## 2022-10-20 ENCOUNTER — Ambulatory Visit: Payer: BLUE CROSS/BLUE SHIELD | Admitting: Podiatry
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
## 2022-10-21 ENCOUNTER — Encounter: Payer: Self-pay | Admitting: Emergency Medicine

## 2022-10-21 ENCOUNTER — Ambulatory Visit (INDEPENDENT_AMBULATORY_CARE_PROVIDER_SITE_OTHER): Payer: BLUE CROSS/BLUE SHIELD | Admitting: Podiatry

## 2022-10-21 DIAGNOSIS — Z91199 Patient's noncompliance with other medical treatment and regimen due to unspecified reason: Secondary | ICD-10-CM

## 2022-10-21 DIAGNOSIS — M79671 Pain in right foot: Secondary | ICD-10-CM | POA: Insufficient documentation

## 2022-10-21 DIAGNOSIS — M79672 Pain in left foot: Secondary | ICD-10-CM | POA: Insufficient documentation

## 2022-10-21 DIAGNOSIS — G8929 Other chronic pain: Secondary | ICD-10-CM | POA: Diagnosis not present

## 2022-10-21 DIAGNOSIS — T7421XA Adult sexual abuse, confirmed, initial encounter: Secondary | ICD-10-CM | POA: Insufficient documentation

## 2022-10-21 DIAGNOSIS — N760 Acute vaginitis: Secondary | ICD-10-CM | POA: Insufficient documentation

## 2022-10-21 DIAGNOSIS — B9689 Other specified bacterial agents as the cause of diseases classified elsewhere: Secondary | ICD-10-CM | POA: Diagnosis not present

## 2022-10-21 LAB — POC URINE PREG, ED: Preg Test, Ur: NEGATIVE
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
## 2022-10-21 NOTE — Progress Notes (Signed)
Pt was no show for [place] 

## 2022-10-22 ENCOUNTER — Emergency Department
Admission: EM | Admit: 2022-10-22 | Discharge: 2022-10-22 | Disposition: A | Discharge status: Home / Self Care | Payer: BLUE CROSS/BLUE SHIELD | Attending: Emergency Medicine | Admitting: Emergency Medicine

## 2022-10-22 DIAGNOSIS — T7421XA Adult sexual abuse, confirmed, initial encounter: Secondary | ICD-10-CM

## 2022-10-22 DIAGNOSIS — N760 Acute vaginitis: Secondary | ICD-10-CM

## 2022-10-22 DIAGNOSIS — G8929 Other chronic pain: Secondary | ICD-10-CM

## 2022-10-22 LAB — URINALYSIS, ROUTINE W REFLEX MICROSCOPIC
Bilirubin Urine: NEGATIVE
Glucose, UA: NEGATIVE mg/dL
Hgb urine dipstick: NEGATIVE
Ketones, ur: NEGATIVE mg/dL
Nitrite: NEGATIVE
Protein, ur: NEGATIVE mg/dL
Specific Gravity, Urine: 1.011 (ref 1.005–1.030)
pH: 6 (ref 5.0–8.0)

## 2022-10-22 LAB — WET PREP, GENITAL
Sperm: NONE SEEN
Trich, Wet Prep: NONE SEEN
WBC, Wet Prep HPF POC: <10 (ref <10)
Yeast Wet Prep HPF POC: NONE SEEN

## 2022-10-22 LAB — CHLAMYDIA/NGC RT PCR (ARMC ONLY)
Chlamydia Tr: NOT DETECTED
N gonorrhoeae: NOT DETECTED

## 2022-10-22 MED ORDER — METRONIDAZOLE 500 MG PO TABS: 500.0000 mg | ORAL_TABLET | Freq: Two times a day (BID) | ORAL | 0 refills | Status: AC

## 2022-10-22 MED ORDER — KETOROLAC TROMETHAMINE 30 MG/ML IJ SOLN
30.0000 mg | Freq: Once | INTRAMUSCULAR | Status: AC
  Administered 2022-10-22: 30 mg via INTRAMUSCULAR
  Filled 2022-10-22: qty 1
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
## 2022-10-27 ENCOUNTER — Ambulatory Visit (INDEPENDENT_AMBULATORY_CARE_PROVIDER_SITE_OTHER): Payer: Self-pay | Admitting: Physician Assistant

## 2022-10-27 ENCOUNTER — Ambulatory Visit (INDEPENDENT_AMBULATORY_CARE_PROVIDER_SITE_OTHER): Payer: Medicaid Other | Admitting: Podiatry

## 2022-10-27 DIAGNOSIS — Z91199 Patient's noncompliance with other medical treatment and regimen due to unspecified reason: Secondary | ICD-10-CM

## 2022-10-27 DIAGNOSIS — S93691A Other sprain of right foot, initial encounter: Secondary | ICD-10-CM

## 2022-10-27 DIAGNOSIS — M79676 Pain in unspecified toe(s): Secondary | ICD-10-CM

## 2022-10-27 NOTE — Progress Notes (Signed)
No show

## 2022-10-27 NOTE — Progress Notes (Deleted)
    SUBJECTIVE:   CHIEF COMPLAINT / HPI:   Kelly Carr is a 52 y.o. adult who presents to the [facility] clinic today to discuss the following concerns:   Foot Pain   PERTINENT  PMH / PSH: BP  OBJECTIVE:   There were no vitals taken for this visit.   General: NAD, pleasant, able to participate in exam Cardiac: RRR, no murmurs. Respiratory: CTAB, normal effort, No wheezes, rales or rhonchi Abdomen: Bowel sounds present, nontender, nondistended, no hepatosplenomegaly. Extremities: no edema or cyanosis. Skin: warm and dry, no rashes noted Neuro: alert, no obvious focal deficits Psych: Normal affect and mood  ASSESSMENT/PLAN:   No problem-specific Assessment & Plan notes found for this encounter.     Sabino Dick, DO [facility]

## 2022-10-28 ENCOUNTER — Ambulatory Visit: Payer: BLUE CROSS/BLUE SHIELD

## 2022-10-28 NOTE — Progress Notes (Signed)
Subjective:   Patient ID: Kelly Carr, adult   DOB: 52 y.o.   MRN: [id number]   HPI Not able to see patient   ROS      Objective:  Physical Exam  Not able to see patient insurance     Assessment:  Not able to see patient insurance     Plan:  Not able to see patient insurance

## 2022-11-04 ENCOUNTER — Ambulatory Visit (INDEPENDENT_AMBULATORY_CARE_PROVIDER_SITE_OTHER): Payer: BLUE CROSS/BLUE SHIELD | Admitting: Family Medicine

## 2022-11-04 ENCOUNTER — Encounter: Payer: Self-pay | Admitting: Family Medicine

## 2022-11-04 VITALS — BP 120/76 | HR 97 | Ht 64.0 in | Wt 167.0 lb

## 2022-11-04 DIAGNOSIS — K047 Periapical abscess without sinus: Secondary | ICD-10-CM

## 2022-11-04 DIAGNOSIS — J302 Other seasonal allergic rhinitis: Secondary | ICD-10-CM | POA: Diagnosis not present

## 2022-11-04 DIAGNOSIS — S93691A Other sprain of right foot, initial encounter: Secondary | ICD-10-CM | POA: Insufficient documentation

## 2022-11-04 DIAGNOSIS — Z716 Tobacco abuse counseling: Secondary | ICD-10-CM

## 2022-11-04 DIAGNOSIS — S93691D Other sprain of right foot, subsequent encounter: Secondary | ICD-10-CM

## 2022-11-04 MED ORDER — NICOTINE POLACRILEX 2 MG MT GUM
2.0000 mg | CHEWING_GUM | OROMUCOSAL | 10 refills | Status: DC | PRN
Start: 2022-11-04 — End: 2023-07-11

## 2022-11-04 MED ORDER — FLUCONAZOLE 150 MG PO TABS
150.0000 mg | ORAL_TABLET | ORAL | 2 refills | Status: AC
Start: 2022-11-04 — End: 2022-11-08

## 2022-11-04 MED ORDER — AMOXICILLIN 500 MG PO TABS
500.0000 mg | ORAL_TABLET | Freq: Two times a day (BID) | ORAL | 0 refills | Status: AC
Start: 2022-11-04 — End: 2022-11-09

## 2022-11-04 MED ORDER — NICOTINE POLACRILEX 2 MG MT GUM: 2.0000 mg | CHEWING_GUM | OROMUCOSAL | 2 refills | Status: DC | PRN

## 2022-11-04 MED ORDER — CETIRIZINE HCL 10 MG PO TABS: 10.0000 mg | ORAL_TABLET | Freq: Every day | ORAL | 3 refills | Status: DC

## 2022-11-04 MED ORDER — ALBUTEROL SULFATE HFA 108 (90 BASE) MCG/ACT IN AERS: 1.0000 | INHALATION_SPRAY | Freq: Four times a day (QID) | RESPIRATORY_TRACT | 2 refills | Status: DC | PRN
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
## 2022-11-04 NOTE — Assessment & Plan Note (Signed)
Refilled nicorette gum as requested.

## 2022-11-04 NOTE — Assessment & Plan Note (Signed)
Circular lesion appears to be erosion type injury from nearby tooth. No evidence of abscess. Recommend she use the prescription mouth wash as instructed by dentist. Rx amoxicillin (and diflucan for abx-related yeast vaginitis), provided printed script. Only to be filled if mouth wash doesn't provide relief or the ulcer gets worse.

## 2022-11-04 NOTE — Patient Instructions (Addendum)
It was wonderful to see you today. Thank you for allowing me to be a part of your care. Below is a short summary of what we discussed at your visit today:  Dental sore I can see where dentist is concerned about this.  It looks like a spot that has been chronically rubbed by her tooth and is now open.  It does not look actively infected, however it does look sore and raw.  Use the prescription mouthwash from your dentist as prescribed.  If this spot gets worse despite the mouthwash, then you may take the printed prescription of amoxicillin to your pharmacy and take it as prescribed.  I also printed a prescription for Diflucan to prevent yeast infection with amoxicillin use.  Foot issues Please call your insurance to soon as possible to see if he can get back in with podiatry.  Ask about the high co-pay amount.  I have referred you to orthopedics for further management.  Someone from their office should be calling you in 1 to 2 weeks to schedule an appointment.  If you do not hear from them, let us know. We may need to nudge along the referral.    Other issues I have refilled your Nicorette gum, albuterol, and cetirizine (Zyrtec) You are not diabetic.  The last A1c shows that you are just inside the prediabetic range.  We would need to elevated A1c measurements in a row to call you prediabetic. Simply work on eating less packaged foods, more fresh fruits and vegetables (anything for the produce section is okay) Walking 150 minutes/week also helps your body with blood sugar control Your blood pressure today is excellent  Primary care doctor graduating soon! Your primary care doctor, Dr. Melba Coon, is graduating June 28th. You will be assigned a new PCP.   Please bring all of your medications to every appointment! If you have any questions or concerns, please do not hesitate to contact us via phone or MyChart message.   Fayette Pho, MD

## 2022-11-04 NOTE — Assessment & Plan Note (Signed)
Refilled Zyrtec and Albuterol as requested.

## 2022-11-04 NOTE — Assessment & Plan Note (Signed)
Physical exam reassuring, able to walk on foot albeit slowly.  - compression socks to help with swelling - wear cam boot as instructed by podiatry - call insurance to ask about co-pay (she has both Medicaid and [employer]?) - referral to orthopedics as requested

## 2022-11-09 ENCOUNTER — Telehealth: Payer: Self-pay | Admitting: Physician Assistant

## 2022-11-09 ENCOUNTER — Ambulatory Visit (INDEPENDENT_AMBULATORY_CARE_PROVIDER_SITE_OTHER): Payer: BLUE CROSS/BLUE SHIELD

## 2022-11-09 DIAGNOSIS — F3163 Bipolar disorder, current episode mixed, severe, without psychotic features: Secondary | ICD-10-CM | POA: Diagnosis not present

## 2022-11-09 MED ORDER — LITHIUM CARBONATE 300 MG PO TABS: 300.0000 mg | ORAL_TABLET | Freq: Every day | ORAL | 1 refills | Status: DC

## 2022-11-09 NOTE — Progress Notes (Signed)
NURSES NOTE:       Pt receives  monthly Abilify Maintena 400 mg injection. Pt's medication is provided by a sample supply from the manufacturer. Last injection was 10/12/2022. Today she received injection IM in right upper gluteal area.  Tolerated well. Pt was pleasant, not as hyperverbal as last month. She has been taking her Lithium and I instructed her to pick up her refill at [facility]. She also informed me she was currently taking oral Aripiprazole 20 mg daily, I contacted [facility] and they last filled it on 09/29/22. After informing Kelly Carr pt taking oral Aripiprazole she advised me to have pt to stop it immediately since she is on the injectable. She verbalized understanding. She will be setting up her next apt with Kelly Carr.  LOT [id number] EXP DEC 2026

## 2022-11-09 NOTE — Telephone Encounter (Signed)
Per [name], Please send a phone message for RF on Lithium. She shouldn't stop abruptly.   Send refill of Lithium to [facility].  No scheduled appts.  She will call back to  schedule follow up with [name] to coordinate with the same date as when she gets her injection from [place].

## 2022-11-09 NOTE — Telephone Encounter (Signed)
Rx sent to requested pharmacy

## 2022-11-11 ENCOUNTER — Ambulatory Visit: Payer: BLUE CROSS/BLUE SHIELD | Admitting: Cardiology

## 2022-11-11 NOTE — Progress Notes (Deleted)
Cardiology CONSULT Note    Date:  11/11/2022   ID:  Kelly Carr, Kelly Carr 28-Sep-1970, MRN [id number]  PCP:  Sabino Dick, DO  Cardiologist:  Armanda Magic, MD   No chief complaint on file.   Patient Profile: Kelly Carr is a 52 y.o. adult who is being seen today for the evaluation of syncope at the request of Sabino Dick, DO.  History of Present Illness:  Kelly Carr is a 52 y.o. adult who is being seen today for the evaluation of syncope at the request of Sabino Dick, DO.  This is a 52 year old female with a history of depression, bipolar disorder and syncope.  She was initially evaluated in 2019 for chest pain with normal stress test and 2D echocardiogram at that time.  In 2021 she presented with unexplained dizziness, syncope and blackout spells for several months.  Several were witnessed by her boyfriend.  There was no seizure-like activity.Orthostatic blood pressures were normal in the office and hydration was encouraged.She was instructed to not drive for 6 months.  Heart monitor was ordered but she never completed this.  Cardiac Studies & Procedures     STRESS TESTS  MYOCARDIAL PERFUSION IMAGING 09/28/2017  Narrative  Nuclear stress EF: 63%.  Blood pressure demonstrated a normal response to exercise.  No T wave inversion was noted during stress.  There was no ST segment deviation noted during stress.  This is a low risk study.  No significant reversible ischemia. Possible mild septal attenuation artifact. LVEF 63% with normal wall motion. This is a low risk study.   ECHOCARDIOGRAM  ECHOCARDIOGRAM COMPLETE 09/28/2017  Narrative *[facility]* [address] [place], [place] [address] [phone]  ------------------------------------------------------------------- Transthoracic Echocardiography  Patient:    Kelly, Carr MR #:       [id number] Study Date: 09/28/2017 Gender:     F Age:        61 Height:     162.6  cm Weight:     59 kg BSA:        1.64 m^2 Pt. Status: Room:  ATTENDING    Armanda Magic, MD ORDERING     Armanda Magic, MD REFERRING    Armanda Magic, MD SONOGRAPHER  Clearence Ped, RCS PERFORMING   [facility]  cc:  ------------------------------------------------------------------- LV EF: 55% -   60%  ------------------------------------------------------------------- Indications:      Chest Pain (R07.9).  ------------------------------------------------------------------- Study Conclusions  - Left ventricle: The cavity size was normal. Wall thickness was normal. Systolic function was normal. The estimated ejection fraction was in the range of 55% to 60%. Wall motion was normal; there were no regional wall motion abnormalities. Left ventricular diastolic function parameters were normal.  Impressions:  - Normal study.  ------------------------------------------------------------------- Study data:  No prior study was available for comparison.  Study status:  Routine.  Procedure:  The patient reported no pain pre or post test. Transthoracic echocardiography. Image quality was adequate.          Transthoracic echocardiography.  M-mode, complete 2D, spectral Doppler, and color Doppler.  Birthdate: Patient birthdate: 07-29-1970.  Age:  Patient is 52 yr old.  Sex: Gender: female.    BMI: 22.3 kg/m^2.  Blood pressure:     112/76 Patient status:  Outpatient.  Study date:  Study date: 09/28/2017. Study time: 07:36 AM.  Location:  [facility]  -------------------------------------------------------------------  ------------------------------------------------------------------- Left ventricle:  The cavity size was normal. Wall thickness was normal. Systolic function was normal. The estimated  ejection fraction was in the range of 55% to 60%. Wall motion was normal; there were no regional wall motion abnormalities. Left ventricular diastolic function parameters were  normal.  ------------------------------------------------------------------- Aortic valve:   Trileaflet; normal thickness leaflets. Mobility was not restricted.  Doppler:  Transvalvular velocity was within the normal range. There was no stenosis. There was no regurgitation.  ------------------------------------------------------------------- Aorta:  Aortic root: The aortic root was normal in size.  ------------------------------------------------------------------- Mitral valve:   Structurally normal valve.   Mobility was not restricted.  Doppler:  Transvalvular velocity was within the normal range. There was no evidence for stenosis. There was no regurgitation.    Peak gradient (D): 3 mm Hg.  ------------------------------------------------------------------- Left atrium:  The atrium was normal in size.  ------------------------------------------------------------------- Right ventricle:  The cavity size was normal. Systolic function was normal.  ------------------------------------------------------------------- Pulmonic valve:    Doppler:  Transvalvular velocity was within the normal range. There was no evidence for stenosis.  ------------------------------------------------------------------- Tricuspid valve:   Structurally normal valve.    Doppler: Transvalvular velocity was within the normal range. There was trivial regurgitation.  ------------------------------------------------------------------- Pulmonary artery:   Systolic pressure was within the normal range.  ------------------------------------------------------------------- Right atrium:  The atrium was normal in size.  ------------------------------------------------------------------- Pericardium:  There was no pericardial effusion.  ------------------------------------------------------------------- Systemic veins: Inferior vena cava: The vessel was normal in size. The respirophasic diameter changes were  in the normal range (= 50%), consistent with normal central venous pressure. Diameter: 13 mm.  ------------------------------------------------------------------- Measurements  IVC                                        Value        Reference ID                                         13    mm     ----------  Left ventricle                             Value        Reference LV ID, ED, PLAX chordal            (L)     42.7  mm     43 - 52 LV ID, ES, PLAX chordal                    26.7  mm     23 - 38 LV fx shortening, PLAX chordal             37    %      >=29 LV PW thickness, ED                        10.13 mm     ---------- IVS/LV PW ratio, ED                        0.57         <=1.3 Stroke volume, 2D                          66    ml     ----------  Stroke volume/bsa, 2D                      40    ml/m^2 ---------- LV e&', lateral                             16.1  cm/s   ---------- LV E/e&', lateral                           5.66         ---------- LV e&', medial                              13.1  cm/s   ---------- LV E/e&', medial                            6.96         ---------- LV e&', average                             14.6  cm/s   ---------- LV E/e&', average                           6.25         ----------  Ventricular septum                         Value        Reference IVS thickness, ED                          5.82  mm     ----------  LVOT                                       Value        Reference LVOT ID, S                                 20    mm     ---------- LVOT area                                  3.14  cm^2   ---------- LVOT peak velocity, S                      113   cm/s   ---------- LVOT mean velocity, S                      64.3  cm/s   ---------- LVOT VTI, S                                21    cm     ---------- LVOT peak gradient, S                      5  mm Hg  ----------  Aorta                                      Value        Reference Aortic  root ID, ED                         28    mm     ----------  Left atrium                                Value        Reference LA ID, A-P, ES                             32    mm     ---------- LA ID/bsa, A-P                             1.96  cm/m^2 <=2.2 LA volume, S                               31.7  ml     ---------- LA volume/bsa, S                           19.4  ml/m^2 ---------- LA volume, ES, 1-p A4C                     26.9  ml     ---------- LA volume/bsa, ES, 1-p A4C                 16.4  ml/m^2 ---------- LA volume, ES, 1-p A2C                     32.3  ml     ---------- LA volume/bsa, ES, 1-p A2C                 19.7  ml/m^2 ----------  Mitral valve                               Value        Reference Mitral E-wave peak velocity                91.2  cm/s   ---------- Mitral A-wave peak velocity                66.5  cm/s   ---------- Mitral deceleration time                   208   ms     150 - 230 Mitral peak gradient, D                    3     mm Hg  ---------- Mitral E/A ratio, peak                     1.4          ----------  Pulmonary arteries  Value        Reference PA pressure, S, DP                         12    mm Hg  <=30  Tricuspid valve                            Value        Reference Tricuspid regurg peak velocity             151   cm/s   ---------- Tricuspid peak RV-RA gradient              9     mm Hg  ---------- Tricuspid maximal regurg velocity,         151   cm/s   ---------- PISA  Right atrium                               Value        Reference RA ID, S-I, ES, A4C                        40.8  mm     34 - 49 RA area, ES, A4C                           10.3  cm^2   8.3 - 19.5 RA volume, ES, A/L                         21.7  ml     ---------- RA volume/bsa, ES, A/L                     13.3  ml/m^2 ----------  Systemic veins                             Value        Reference Estimated CVP                              3     mm Hg   ----------  Right ventricle                            Value        Reference TAPSE                                      23    mm     ---------- RV pressure, S, DP                         12    mm Hg  <=30 RV s&', lateral, S                          12.8  cm/s   ----------  Legend: (L)  and  (H)  mark values outside specified reference range.  ------------------------------------------------------------------- Prepared and Electronically Authenticated by  Olga Millers 2019-04-25T09:29:40  Past Medical History:  Diagnosis Date   Abnormal Pap smear    Unknown results>colpo>normal   Abscess 10/14/2021   Anxiety    Arthritis    Asthma    Bipolar 1 disorder (HCC)    Cervical strain 05/04/2021   Depression    Forearm strain, left, initial encounter 05/04/2021   Labral tear of hip joint 12/27/2014   Orbital floor (blow-out) closed fracture (HCC) 10/17/2018   Orbital floor (blow-out) closed fracture (HCC) 10/17/2018   Paresthesia and pain of extremity 04/07/2021   PTSD (post-traumatic stress disorder)    Rotator cuff tendinitis, right 05/04/2021    Past Surgical History:  Procedure Laterality Date   NO PAST SURGERIES      Current Medications: No outpatient medications have been marked as taking for the 11/11/22 encounter (Appointment) with Quintella Reichert, MD.    Allergies:   Doxycycline, Celebrex [celecoxib], Gabapentin, Haldol [haloperidol lactate], Prednisone, Risperidone and related, Tramadol, and Valproic acid   Social History   Socioeconomic History   Marital status: Single    Spouse name: Not on file   Number of children: Not on file   Years of education: Not on file   Highest education level: Not on file  Occupational History   Not on file  Tobacco Use   Smoking status: Every Day    Types: E-cigarettes   Smokeless tobacco: Never  Vaping Use   Vaping Use: Every day   Substances: Nicotine, Flavoring  Substance and Sexual Activity   Alcohol  use: Not Currently   Drug use: Yes    Types: Marijuana   Sexual activity: Yes    Partners: Male    Birth control/protection: None  Other Topics Concern   Not on file  Social History Narrative   Not on file   Social Determinants of Health   Financial Resource Strain: Not on file  Food Insecurity: No Food Insecurity (09/21/2022)   Hunger Vital Sign    Worried About Running Out of Food in the Last Year: Never true    Ran Out of Food in the Last Year: Never true  Transportation Needs: No Transportation Needs (09/21/2022)   PRAPARE - Administrator, Civil Service (Medical): No    Lack of Transportation (Non-Medical): No  Physical Activity: Not on file  Stress: Not on file  Social Connections: Not on file     Family History:  The patient's family history includes Bipolar disorder in her cousin and nephew; Depression in her daughter; Diabetes in her maternal grandfather, maternal grandmother, and mother; Heart disease in her maternal grandfather and maternal grandmother; Heart disease (age of onset: 36) in her mother; Hypertension in her mother; Schizophrenia in her mother.   ROS:   Please see the history of present illness.    ROS All other systems reviewed and are negative.      No data to display             PHYSICAL EXAM:   VS:  There were no vitals taken for this visit.   GEN: Well nourished, well developed, in no acute distress  HEENT: normal  Neck: no JVD, carotid bruits, or masses Cardiac: RRR; no murmurs, rubs, or gallops,no edema.  Intact distal pulses bilaterally.  Respiratory:  clear to auscultation bilaterally, normal work of breathing GI: soft, nontender, nondistended, + BS MS: no deformity or atrophy  Skin: warm and dry, no rash Neuro:  Alert and Oriented x 3, Strength and sensation are intact Psych: euthymic  mood, full affect  Wt Readings from Last 3 Encounters:  11/04/22 167 lb (75.8 kg)  10/21/22 163 lb 9.3 oz (74.2 kg)  10/05/22 163 lb  (73.9 kg)      Studies/Labs Reviewed:   EKG:  EKG is ordered today.  The ekg ordered today demonstrates ***  Cardiac Studies & Procedures     STRESS TESTS  MYOCARDIAL PERFUSION IMAGING 09/28/2017  Narrative  Nuclear stress EF: 63%.  Blood pressure demonstrated a normal response to exercise.  No T wave inversion was noted during stress.  There was no ST segment deviation noted during stress.  This is a low risk study.  No significant reversible ischemia. Possible mild septal attenuation artifact. LVEF 63% with normal wall motion. This is a low risk study.   ECHOCARDIOGRAM  ECHOCARDIOGRAM COMPLETE 09/28/2017  Narrative *[facility]* [address] [place], [place] [address] [phone]  ------------------------------------------------------------------- Transthoracic Echocardiography  Patient:    River, Mckercher MR #:       [id number] Study Date: 09/28/2017 Gender:     F Age:        81 Height:     162.6 cm Weight:     59 kg BSA:        1.64 m^2 Pt. Status: Room:  ATTENDING    Armanda Magic, MD ORDERING     Armanda Magic, MD REFERRING    Armanda Magic, MD SONOGRAPHER  Clearence Ped, RCS PERFORMING   [facility]  cc:  ------------------------------------------------------------------- LV EF: 55% -   60%  ------------------------------------------------------------------- Indications:      Chest Pain (R07.9).  ------------------------------------------------------------------- Study Conclusions  - Left ventricle: The cavity size was normal. Wall thickness was normal. Systolic function was normal. The estimated ejection fraction was in the range of 55% to 60%. Wall motion was normal; there were no regional wall motion abnormalities. Left ventricular diastolic function parameters were normal.  Impressions:  - Normal study.  ------------------------------------------------------------------- Study data:  No prior study was available for  comparison.  Study status:  Routine.  Procedure:  The patient reported no pain pre or post test. Transthoracic echocardiography. Image quality was adequate.          Transthoracic echocardiography.  M-mode, complete 2D, spectral Doppler, and color Doppler.  Birthdate: Patient birthdate: November 04, 1970.  Age:  Patient is 52 yr old.  Sex: Gender: female.    BMI: 22.3 kg/m^2.  Blood pressure:     112/76 Patient status:  Outpatient.  Study date:  Study date: 09/28/2017. Study time: 07:36 AM.  Location:  [facility]  -------------------------------------------------------------------  ------------------------------------------------------------------- Left ventricle:  The cavity size was normal. Wall thickness was normal. Systolic function was normal. The estimated ejection fraction was in the range of 55% to 60%. Wall motion was normal; there were no regional wall motion abnormalities. Left ventricular diastolic function parameters were normal.  ------------------------------------------------------------------- Aortic valve:   Trileaflet; normal thickness leaflets. Mobility was not restricted.  Doppler:  Transvalvular velocity was within the normal range. There was no stenosis. There was no regurgitation.  ------------------------------------------------------------------- Aorta:  Aortic root: The aortic root was normal in size.  ------------------------------------------------------------------- Mitral valve:   Structurally normal valve.   Mobility was not restricted.  Doppler:  Transvalvular velocity was within the normal range. There was no evidence for stenosis. There was no regurgitation.    Peak gradient (D): 3 mm Hg.  ------------------------------------------------------------------- Left atrium:  The atrium was normal in size.  ------------------------------------------------------------------- Right ventricle:  The cavity size was normal.  Systolic function  was normal.  ------------------------------------------------------------------- Pulmonic valve:    Doppler:  Transvalvular velocity was within the normal range. There was no evidence for stenosis.  ------------------------------------------------------------------- Tricuspid valve:   Structurally normal valve.    Doppler: Transvalvular velocity was within the normal range. There was trivial regurgitation.  ------------------------------------------------------------------- Pulmonary artery:   Systolic pressure was within the normal range.  ------------------------------------------------------------------- Right atrium:  The atrium was normal in size.  ------------------------------------------------------------------- Pericardium:  There was no pericardial effusion.  ------------------------------------------------------------------- Systemic veins: Inferior vena cava: The vessel was normal in size. The respirophasic diameter changes were in the normal range (= 50%), consistent with normal central venous pressure. Diameter: 13 mm.  ------------------------------------------------------------------- Measurements  IVC                                        Value        Reference ID                                         13    mm     ----------  Left ventricle                             Value        Reference LV ID, ED, PLAX chordal            (L)     42.7  mm     43 - 52 LV ID, ES, PLAX chordal                    26.7  mm     23 - 38 LV fx shortening, PLAX chordal             37    %      >=29 LV PW thickness, ED                        10.13 mm     ---------- IVS/LV PW ratio, ED                        0.57         <=1.3 Stroke volume, 2D                          66    ml     ---------- Stroke volume/bsa, 2D                      40    ml/m^2 ---------- LV e&', lateral                             16.1  cm/s   ---------- LV E/e&', lateral                           5.66          ---------- LV e&', medial  13.1  cm/s   ---------- LV E/e&', medial                            6.96         ---------- LV e&', average                             14.6  cm/s   ---------- LV E/e&', average                           6.25         ----------  Ventricular septum                         Value        Reference IVS thickness, ED                          5.82  mm     ----------  LVOT                                       Value        Reference LVOT ID, S                                 20    mm     ---------- LVOT area                                  3.14  cm^2   ---------- LVOT peak velocity, S                      113   cm/s   ---------- LVOT mean velocity, S                      64.3  cm/s   ---------- LVOT VTI, S                                21    cm     ---------- LVOT peak gradient, S                      5     mm Hg  ----------  Aorta                                      Value        Reference Aortic root ID, ED                         28    mm     ----------  Left atrium                                Value        Reference LA  ID, A-P, ES                             32    mm     ---------- LA ID/bsa, A-P                             1.96  cm/m^2 <=2.2 LA volume, S                               31.7  ml     ---------- LA volume/bsa, S                           19.4  ml/m^2 ---------- LA volume, ES, 1-p A4C                     26.9  ml     ---------- LA volume/bsa, ES, 1-p A4C                 16.4  ml/m^2 ---------- LA volume, ES, 1-p A2C                     32.3  ml     ---------- LA volume/bsa, ES, 1-p A2C                 19.7  ml/m^2 ----------  Mitral valve                               Value        Reference Mitral E-wave peak velocity                91.2  cm/s   ---------- Mitral A-wave peak velocity                66.5  cm/s   ---------- Mitral deceleration time                   208   ms     150 - 230 Mitral peak gradient, D                     3     mm Hg  ---------- Mitral E/A ratio, peak                     1.4          ----------  Pulmonary arteries                         Value        Reference PA pressure, S, DP                         12    mm Hg  <=30  Tricuspid valve                            Value        Reference Tricuspid regurg peak velocity             151   cm/s   ---------- Tricuspid peak RV-RA gradient  9     mm Hg  ---------- Tricuspid maximal regurg velocity,         151   cm/s   ---------- PISA  Right atrium                               Value        Reference RA ID, S-I, ES, A4C                        40.8  mm     34 - 49 RA area, ES, A4C                           10.3  cm^2   8.3 - 19.5 RA volume, ES, A/L                         21.7  ml     ---------- RA volume/bsa, ES, A/L                     13.3  ml/m^2 ----------  Systemic veins                             Value        Reference Estimated CVP                              3     mm Hg  ----------  Right ventricle                            Value        Reference TAPSE                                      23    mm     ---------- RV pressure, S, DP                         12    mm Hg  <=30 RV s&', lateral, S                          12.8  cm/s   ----------  Legend: (L)  and  (H)  mark values outside specified reference range.  ------------------------------------------------------------------- Prepared and Electronically Authenticated by  Olga Millers 2019-04-25T09:29:40             Recent Labs: 09/05/2022: TSH 1.550 09/20/2022: Hemoglobin 11.7; Platelets 350 09/23/2022: ALT 50; BUN 12; Creatinine, Ser 0.64; Potassium 4.3; Sodium 137   Lipid Panel    Component Value Date/Time   CHOL 170 09/05/2022 1542   TRIG 85 09/05/2022 1542   HDL 45 09/05/2022 1542   CHOLHDL 3.8 09/05/2022 1542   CHOLHDL 3.4 02/04/2021 1648   VLDL 12 02/04/2021 1648   LDLCALC 109 (H) 09/05/2022 1542     Additional studies/ records that  were reviewed today include:  none    ASSESSMENT:    No diagnosis found.   PLAN:  In order of problems listed above:  ***  Time  Spent: *** minutes total time of encounter, including *** minutes spent in face-to-face patient care on the date of this encounter. This time includes coordination of care and counseling regarding above mentioned problem list. Remainder of non-face-to-face time involved reviewing chart documents/testing relevant to the patient encounter and documentation in the medical record. I have independently reviewed documentation from referring provider  Followup:  ***  Medication Adjustments/Labs and Tests Ordered: Current medicines are reviewed at length with the patient today.  Concerns regarding medicines are outlined above.  Medication changes, Labs and Tests ordered today are listed in the Patient Instructions below.  There are no Patient Instructions on file for this visit.   Signed, Armanda Magic, MD  11/11/2022 8:55 AM    [facility] [address] [place], [place], [place]  [address] Phone: [phone]; Fax: [phone]

## 2022-11-13 NOTE — ED Provider Notes (Signed)
[facility] Provider Note    Event Date/Time   First MD Initiated Contact with Patient 10/11/22 1352     (approximate)   History   Foot Pain   HPI  Kelly Carr is a 52 y.o. adult for pain on her foot. States that she has been walking a lot needing pain medication. Patient is very somnolent but easily arousable. Pinpoints pupils but normal respiratory drive. No acute fracture or dislocation concern for her lower extremity.   On chart review patient had a history of similar episode where she was evaluated last week, given Narcan with improvement of her symptoms. Patient denying any SI or HI.     Physical Exam   Triage Vital Signs: ED Triage Vitals [10/11/22 1148]  Enc Vitals Group     BP 109/75     Pulse Rate 82     Resp 18     Temp 97.7 F (36.5 C)     Temp Source Oral     SpO2 100 %     Weight      Height      Head Circumference      Peak Flow      Pain Score 10     Pain Loc      Pain Edu?      Excl. in GC?     Most recent vital signs: Vitals:   10/11/22 1357 10/11/22 1400  BP: 116/74 (!) 117/92  Pulse: 67 68  Resp: 14 14  Temp:  98.2 F (36.8 C)  SpO2: 95% 96%    Physical Exam Constitutional:      Appearance: She is well-developed.  HENT:     Head: Atraumatic.  Eyes:     Conjunctiva/sclera: Conjunctivae normal.  Cardiovascular:     Rate and Rhythm: Regular rhythm.  Pulmonary:     Effort: No respiratory distress.  Musculoskeletal:     Cervical back: Normal range of motion.  Skin:    General: Skin is warm.  Neurological:     Mental Status: She is alert. Mental status is at baseline.  Psychiatric:        Thought Content: Thought content does not include suicidal ideation. Thought content does not include suicidal plan.      IMPRESSION / MDM / ASSESSMENT AND PLAN / ED COURSE  I reviewed the triage vital signs and the nursing notes.   Labs (all labs ordered are listed, but only abnormal results are  displayed) Labs interpreted as -    Labs Reviewed - No data to display  Do not feel that the patient needs emergent evaluation by psychiatry at this time. Does not meet criteria for an involuntary commitment. Discussed Tylenol for pain control. Discussed the need to follow-up with [facility] as an outpatient and she stated that she could give them a call and they could see her and discuss possibly getting a ride. Given return precautions for any worsening symptoms.        PROCEDURES:  Critical Care performed: No  Procedures  Patient's presentation is most consistent with acute illness / injury with system symptoms.   MEDICATIONS ORDERED IN ED: Medications - No data to display  FINAL CLINICAL IMPRESSION(S) / ED DIAGNOSES   Final diagnoses:  Pain in both lower extremities     Rx / DC Orders   ED Discharge Orders     None        Note:  This document was prepared using Dragon voice recognition software  and may include unintentional dictation errors.   Corena Herter, MD 11/13/22 [phone]

## 2022-11-15 ENCOUNTER — Encounter: Payer: Self-pay | Admitting: Physician Assistant

## 2022-11-15 ENCOUNTER — Ambulatory Visit (INDEPENDENT_AMBULATORY_CARE_PROVIDER_SITE_OTHER): Payer: BLUE CROSS/BLUE SHIELD | Admitting: Physician Assistant

## 2022-11-15 DIAGNOSIS — F4310 Post-traumatic stress disorder, unspecified: Secondary | ICD-10-CM

## 2022-11-15 DIAGNOSIS — F411 Generalized anxiety disorder: Secondary | ICD-10-CM | POA: Diagnosis not present

## 2022-11-15 DIAGNOSIS — F3163 Bipolar disorder, current episode mixed, severe, without psychotic features: Secondary | ICD-10-CM

## 2022-11-15 DIAGNOSIS — G4700 Insomnia, unspecified: Secondary | ICD-10-CM

## 2022-11-15 DIAGNOSIS — Z720 Tobacco use: Secondary | ICD-10-CM

## 2022-11-15 NOTE — Progress Notes (Signed)
[facility]  Patient ID: Kelly Carr,  MRN: [id number]  PCP: Sabino Dick, DO  Date of Evaluation: 11/15/2022 Time spent:20 minutes  Chief Complaint:  Chief Complaint   Follow-up    HISTORY/CURRENT STATUS: HPI For routine f/u.  Kelly Carr was taking oral Abilify and getting Abilify Maintena by mistake.  Kelly Carr did not realize Kelly Carr was taking it in pill form.  That was stopped last week.  Kelly Carr has been on low-dose lithium for about a month now.  States Kelly Carr feels much better.  Kelly Carr is still not working due to physical problems with her foot.  But Kelly Carr hopes to start working again soon.  Kelly Carr also plans to start back to school again.  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 Is back living with a previous BF b/c Kelly Carr didn't have anywhere else to go. But Kelly Carr wants to try to make it work with him.  Her dtr moved out and Kelly Carr thinks that'll help her and her BF's relationship. Sleeps well.  Energy and motivation are good, ADLs and personal hygiene are normal.  Appetite is normal.  No complaints of anxiety.  Denies suicidal or homicidal thoughts.  Kelly Carr does still have increased talkativeness but feels that her racing thoughts have calmed down a lot.  Patient denies increased energy with decreased need for sleep, impulsivity or risky behaviors, increased spending, increased libido, grandiosity, increased irritability or anger, paranoia, or hallucinations.  Denies dizziness, syncope, seizures, numbness, tingling, tremor, tics, unsteady gait, slurred speech, confusion. Denies muscle or joint pain, stiffness, or dystonia.Denies unexplained weight loss, frequent infections, or sores that heal slowly.  No polyphagia, polydipsia, or polyuria. Denies visual changes or paresthesias.   Individual Medical History/ Review of Systems: Changes? :No      Past medications for mental health diagnoses include: Gabapentin caused lethargy, Ativan, BuSpar, Wellbutrin, Latuda,  trazodone, Cogentin, Geodon, Trileptal, Vraylar, prazosin, Vistaril, Abilify maintainena, Abilify, Risperdal made her a zombie, Zyprexa made her a zombie, Lithium, Seroquel for PTSD but at 150 mg made her groggy (tolerated 25 mg long ago)  Allergies: Doxycycline, Celebrex [celecoxib], Gabapentin, Haldol [haloperidol lactate], Prednisone, Risperidone and related, Tramadol, and Valproic acid  Current Medications:  Current Outpatient Medications:    albuterol (VENTOLIN HFA) 108 (90 Base) MCG/ACT inhaler, Inhale 1-2 puffs into the lungs every 6 (six) hours as needed for wheezing or shortness of breath., Disp: 18 g, Rfl: 2   ARIPiprazole ER (ABILIFY MAINTENA) 400 MG SRER injection, Inject 2 mLs (400 mg total) into the muscle every 28 (twenty-eight) days., Disp: 1 each, Rfl: 3   cetirizine (ZYRTEC) 10 MG tablet, Take 1 tablet (10 mg total) by mouth daily., Disp: 30 tablet, Rfl: 3   fluticasone (FLONASE) 50 MCG/ACT nasal spray, SPRAY 2 SPRAYS INTO EACH NOSTRIL EVERY DAY, Disp: 48 mL, Rfl: 1   hydrocortisone cream 1 %, Apply 1 Application topically 2 (two) times daily as needed for itching., Disp: , Rfl:    hydrOXYzine (ATARAX) 25 MG tablet, Take 1 tablet (25 mg total) by mouth 3 (three) times daily as needed for anxiety., Disp: 30 tablet, Rfl: 1   lithium 300 MG tablet, Take 1 tablet (300 mg total) by mouth daily., Disp: 30 tablet, Rfl: 1   traZODone (DESYREL) 100 MG tablet, Take 2 tablets (200 mg total) by mouth at bedtime., Disp: 30 tablet, Rfl: 0   nicotine polacrilex (NICORETTE) 2 MG gum, Take 1 each (2 mg total) by mouth as needed for smoking cessation. (Patient  not taking: Reported on 11/15/2022), Disp: 220 tablet, Rfl: 10 Medication Side Effects: none  Family Medical/ Social History: Changes? See HPI  MENTAL HEALTH EXAM:  There were no vitals taken for this visit.There is no height or weight on file to calculate BMI.  General Appearance: Casual and Well Groomed  Eye Contact:  Good  Speech:   Clear and Coherent, Normal Rate, and Talkative  Volume:  Normal  Mood:  Euthymic  Affect:  Congruent  Thought Process:  Disorganized and Descriptions of Associations: Circumstantial  Orientation:  Full (Time, Place, and Person)  Thought Content: Logical   Suicidal Thoughts:  No  Homicidal Thoughts:  No  Memory:  WNL  Judgement:  Good  Insight:  Good  Psychomotor Activity:  Normal  Concentration:  Concentration: Good and Attention Span: Fair  Recall:  Good  Fund of Knowledge: Good  Language: Good  Assets:  Desire for Improvement Housing Transportation  ADL's:  Intact  Cognition: WNL  Prognosis:  Good   DIAGNOSES:    ICD-10-CM   1. Bipolar 1 disorder, mixed, severe (HCC)  F31.63     2. Generalized anxiety disorder  F41.1     3. PTSD (post-traumatic stress disorder)  F43.10     4. Insomnia, unspecified type  G47.00     5. Vapes nicotine containing substance  Z72.0      Receiving Psychotherapy: No   Kelly Carr'll start with peer support soon.   RECOMMENDATIONS:  PDMP was reviewed.  Hydrocodone, 28 pills filled 09/13/2022. I provided 20 minutes of face to face time during this encounter, including time spent before and after the visit in records review, medical decision making, counseling pertinent to today's visit, and charting.   Kelly Carr is doing much better so no changes will be made.  Continue Abilify Maintena 400 mg IM q. 28 days. Continue hydroxyzine 10 mg, 1-3 p.o. 3 times daily as needed anxiety. Continue lithium 300 mg, 1 p.o. daily.  Return in 4 weeks.   Melony Overly, PA-C

## 2022-11-21 ENCOUNTER — Telehealth: Payer: Self-pay | Admitting: Physician Assistant

## 2022-11-21 NOTE — Telephone Encounter (Signed)
Marciel called at 10:55 to request refill of her Hydroxyzine.  She is using a new pharmacy.  Send to [facility] in [place]

## 2022-11-22 ENCOUNTER — Other Ambulatory Visit: Payer: Self-pay | Admitting: Physician Assistant

## 2022-11-22 MED ORDER — HYDROXYZINE HCL 25 MG PO TABS: 25.0000 mg | ORAL_TABLET | Freq: Three times a day (TID) | ORAL | 1 refills | Status: DC | PRN

## 2022-11-22 NOTE — Telephone Encounter (Signed)
I sent in Rx for Hydroxyzine 25 mg, which was the last dose on the medication list.  I made a mistake and documented in my note that she was only taking 10 mg.

## 2022-11-22 NOTE — Telephone Encounter (Signed)
In May it looks like Rx was printed for hydroxyzine at 1 tab TID PRN.   Last visit:  Continue hydroxyzine 10 mg, 1-3 p.o. 3 times daily as needed anxiety.   She said she had changed pharmacies and is asking for Rx to be sent. I know med is prn, but didn't know if ok to Rx more than 30 month.

## 2022-12-05 ENCOUNTER — Telehealth: Payer: Self-pay | Admitting: Physician Assistant

## 2022-12-05 NOTE — Telephone Encounter (Signed)
Pt called asking when this week she can have her shot

## 2022-12-05 NOTE — Telephone Encounter (Signed)
Scheduled tomorrow 07/02

## 2022-12-06 ENCOUNTER — Ambulatory Visit (INDEPENDENT_AMBULATORY_CARE_PROVIDER_SITE_OTHER): Payer: BLUE CROSS/BLUE SHIELD

## 2022-12-06 DIAGNOSIS — F3163 Bipolar disorder, current episode mixed, severe, without psychotic features: Secondary | ICD-10-CM

## 2022-12-06 DIAGNOSIS — F312 Bipolar disorder, current episode manic severe with psychotic features: Secondary | ICD-10-CM

## 2022-12-06 MED ORDER — ARIPIPRAZOLE ER 400 MG IM PRSY: 400.0000 mg | PREFILLED_SYRINGE | INTRAMUSCULAR | Status: DC

## 2022-12-12 MED ORDER — ARIPIPRAZOLE ER 400 MG IM PRSY
400.0000 mg | PREFILLED_SYRINGE | Freq: Once | INTRAMUSCULAR | Status: AC
  Administered 2022-12-06: 400 mg via INTRAMUSCULAR

## 2022-12-12 MED ORDER — ARIPIPRAZOLE ER 400 MG IM PRSY
400.0000 mg | PREFILLED_SYRINGE | Freq: Once | INTRAMUSCULAR | Status: AC
  Administered 2022-10-12: 400 mg via INTRAMUSCULAR

## 2022-12-12 NOTE — Addendum Note (Signed)
Addended by: Lambert Keto on: 12/12/2022 10:58 AM   Modules accepted: Orders

## 2022-12-12 NOTE — Progress Notes (Signed)
NURSES NOTE:  Pt arrived for her monthly Abilify Maintena 400 mg injection. Pt's medication is provided by a sample supply from the manufacturer. Last injection was 11/09/2022. Pt is doing much better today and made comments about her previous behavior and admitted to being embarrased with what she said and did. Reassured her that we understood it was how her mania was affecting her. She was appreciative of the support. Injection given in left upper gluteal area. Tolerated well. Pt was very pleasant. Pt advised to follow up in 28 days for her next injection and to keep her apts with Melony Overly, PA-C.  LOT  [id number] EXP DEC 2026

## 2022-12-14 ENCOUNTER — Ambulatory Visit (INDEPENDENT_AMBULATORY_CARE_PROVIDER_SITE_OTHER): Payer: BLUE CROSS/BLUE SHIELD | Admitting: Physician Assistant

## 2022-12-14 ENCOUNTER — Encounter: Payer: Self-pay | Admitting: Physician Assistant

## 2022-12-14 DIAGNOSIS — F411 Generalized anxiety disorder: Secondary | ICD-10-CM | POA: Diagnosis not present

## 2022-12-14 DIAGNOSIS — F319 Bipolar disorder, unspecified: Secondary | ICD-10-CM

## 2022-12-14 NOTE — Progress Notes (Signed)
[facility]  Patient ID: Kelly Carr,  MRN: [id number]  PCP: Erick Alley, DO  Date of Evaluation: 12/14/2022 Time spent: 26 minutes  Chief Complaint:  Chief Complaint   Follow-up   Virtual Visit via Telehealth  I connected with patient by telephone, with their informed consent, and verified patient privacy and that I am speaking with the correct person using two identifiers.  I am private, in my office and the patient is at home.  I discussed the limitations, risks, security and privacy concerns of performing an evaluation and management service by telephone and the availability of in person appointments. I also discussed with the patient that there may be a patient responsible charge related to this service. The patient expressed understanding and agreed to proceed.   I discussed the assessment and treatment plan with the patient. The patient was provided an opportunity to ask questions and all were answered. The patient agreed with the plan and demonstrated an understanding of the instructions.   The patient was advised to call back or seek an in-person evaluation if the symptoms worsen or if the condition fails to improve as anticipated.  I provided 26 minutes of non-face-to-face time during this encounter.  HISTORY/CURRENT STATUS: HPI For routine f/u.  Doing well now. Quit smoking 3 weeks ago.  Lithium, along with the Abilify, has really helped her mood.  She's not working but wants to look for a job.  She and her daughter has made amends and have a relationship again.  Patient is able to enjoy things.  Energy and motivation are good. No extreme sadness, tearfulness, or feelings of hopelessness.  Sleeps well now. Not taking Trazodone.  ADLs and personal hygiene are normal.   Denies any changes in concentration, making decisions, or remembering things.  Appetite has decreased.  Denies suicidal or homicidal thoughts.  Patient denies increased energy with decreased  need for sleep, increased talkativeness, racing thoughts, impulsivity or risky behaviors, increased spending, increased libido, grandiosity, increased irritability or anger, paranoia, or hallucinations.  She is no longer saying that she knows Melinda Crutch or that she has an album coming out this summer.  Review of Systems  Constitutional: Negative.   HENT: Negative.    Eyes: Negative.   Respiratory: Negative.    Cardiovascular: Negative.   Gastrointestinal: Negative.   Genitourinary: Negative.   Musculoskeletal: Negative.   Skin: Negative.   Neurological: Negative.   Endo/Heme/Allergies: Negative.   Psychiatric/Behavioral:         See HPI   Individual Medical History/ Review of Systems: Changes? :No      Past medications for mental health diagnoses include: Gabapentin caused lethargy, Ativan, BuSpar, Wellbutrin, Latuda, trazodone, Cogentin, Geodon, Trileptal, Vraylar, prazosin, Vistaril, Abilify maintainena, Abilify, Risperdal made her a zombie, Zyprexa made her a zombie, Lithium, Seroquel for PTSD but at 150 mg made her groggy (tolerated 25 mg long ago) Trazodone  Allergies: Doxycycline, Celebrex [celecoxib], Gabapentin, Haldol [haloperidol lactate], Prednisone, Risperidone and related, Tramadol, and Valproic acid  Current Medications:  Current Outpatient Medications:    albuterol (VENTOLIN HFA) 108 (90 Base) MCG/ACT inhaler, Inhale 1-2 puffs into the lungs every 6 (six) hours as needed for wheezing or shortness of breath., Disp: 18 g, Rfl: 2   ARIPiprazole ER (ABILIFY MAINTENA) 400 MG SRER injection, Inject 2 mLs (400 mg total) into the muscle every 28 (twenty-eight) days., Disp: 1 each, Rfl: 3   fluticasone (FLONASE) 50 MCG/ACT nasal spray, SPRAY 2 SPRAYS INTO EACH NOSTRIL EVERY DAY, Disp: 48  mL, Rfl: 1   hydrocortisone cream 1 %, Apply 1 Application topically 2 (two) times daily as needed for itching., Disp: , Rfl:    hydrOXYzine (ATARAX) 25 MG tablet, Take 1 tablet (25 mg total) by  mouth 3 (three) times daily as needed for anxiety., Disp: 30 tablet, Rfl: 1   lithium 300 MG tablet, Take 1 tablet (300 mg total) by mouth daily., Disp: 30 tablet, Rfl: 1   cetirizine (ZYRTEC) 10 MG tablet, Take 1 tablet (10 mg total) by mouth daily. (Patient not taking: Reported on 12/14/2022), Disp: 30 tablet, Rfl: 3   nicotine polacrilex (NICORETTE) 2 MG gum, Take 1 each (2 mg total) by mouth as needed for smoking cessation. (Patient not taking: Reported on 11/15/2022), Disp: 220 tablet, Rfl: 10 Medication Side Effects: none  Family Medical/ Social History: Changes? See HPI  MENTAL HEALTH EXAM:  There were no vitals taken for this visit.There is no height or weight on file to calculate BMI.  General Appearance:  Unable to assess  Eye Contact:   Unable to assess  Speech:  Clear and Coherent, Normal Rate, and Talkative  Volume:  Normal  Mood:  Euthymic  Affect:   Unable to assess  Thought Process:  Disorganized and Descriptions of Associations: Circumstantial  Orientation:  Full (Time, Place, and Person)  Thought Content: Logical   Suicidal Thoughts:  No  Homicidal Thoughts:  No  Memory:  WNL  Judgement:  Good  Insight:  Good  Psychomotor Activity:   Unable to assess  Concentration:  Concentration: Good and Attention Span: Fair  Recall:  Good  Fund of Knowledge: Good  Language: Good  Assets:  Desire for Improvement Housing Transportation  ADL's:  Intact  Cognition: WNL  Prognosis:  Good   DIAGNOSES:    ICD-10-CM   1. Bipolar I disorder (HCC)  F31.9     2. Generalized anxiety disorder  F41.1      Receiving Psychotherapy: No   She'll start with peer support soon.   RECOMMENDATIONS:  PDMP was reviewed.  Hydrocodone, 28 pills filled 09/13/2022. I provided 26 minutes of non-face to face time during this encounter, including time spent before and after the visit in records review, medical decision making, counseling pertinent to today's visit, and charting.   Congratulations  on smoking cessation! She is responding well to her current treatment so no changes will be made. She asked if I would write a letter stating that she is under my care, if she needs it in the future.  She was told of course.  Continue Abilify Maintena 400 mg IM q. 28 days. Continue hydroxyzine 10 mg, 1-3 p.o. 3 times daily as needed anxiety. Continue lithium 300 mg, 1 p.o. daily.  Strongly recommend therapy. Return in 2-3 months  Melony Overly, [place]

## 2022-12-20 ENCOUNTER — Telehealth: Payer: Self-pay | Admitting: Physician Assistant

## 2022-12-20 NOTE — Telephone Encounter (Signed)
Please see message. °

## 2022-12-20 NOTE — Telephone Encounter (Signed)
Kelly Carr called at 4:55 to report that she missed her dose of Lithium last night so she took it this morning.  Now she is wondering if she should also take her dose tonight to get on schedule.  Please call to discuss

## 2022-12-21 NOTE — Telephone Encounter (Signed)
I didn't get message until this morning, she called at office closing time. It would have been fine to take last night, but if she didn't, get back on sched this evening.

## 2022-12-21 NOTE — Telephone Encounter (Signed)
Patient notified

## 2022-12-30 MED ORDER — ARIPIPRAZOLE ER 400 MG IM PRSY
400.0000 mg | PREFILLED_SYRINGE | Freq: Once | INTRAMUSCULAR | Status: AC
Start: 2022-12-30 — End: 2022-11-09
  Administered 2022-11-09: 400 mg via INTRAMUSCULAR

## 2022-12-30 NOTE — Addendum Note (Signed)
Addended by: Lambert Keto on: 12/30/2022 12:53 PM   Modules accepted: Orders

## 2023-01-05 ENCOUNTER — Telehealth: Payer: Self-pay

## 2023-01-05 NOTE — Telephone Encounter (Signed)
Notified patient of recommendations. She is past due for her injection, said she was told Gloris [place] is on vacation. She has been here most of the week.

## 2023-01-05 NOTE — Telephone Encounter (Signed)
Patient notified

## 2023-01-05 NOTE — Telephone Encounter (Signed)
I agree, take the hydroxyzine routinely for a few days, then prn. This seems more situational and a med change won't help. Of course if she has SI w/ plan, go to [facility].  Needs to schedule the appt for Abilify Maintena. If she doesn't get it on time, she knows she is at increased risk of psychosis, leading to law enforcement involvement and/or hospitalization. Please remind her of this.

## 2023-01-05 NOTE — Telephone Encounter (Signed)
Patient reporting for the past 2 weeks has been having a lot of crying, has been real depressed, passive SI, no plan. Reports her BF works a lot of hours and she is home by herself a lot. Sleeps well. Was tearful during conversation.   Taking 300 mg lithium at night, has not been taking hydroxyzine for about the past month. Suggested she take it. Reports she gets her injection every 28 days. Last received 7/2 and does not have an appt yet for next one.

## 2023-01-13 ENCOUNTER — Ambulatory Visit (INDEPENDENT_AMBULATORY_CARE_PROVIDER_SITE_OTHER): Payer: BLUE CROSS/BLUE SHIELD

## 2023-01-13 DIAGNOSIS — F319 Bipolar disorder, unspecified: Secondary | ICD-10-CM

## 2023-01-13 MED ORDER — ARIPIPRAZOLE ER 400 MG IM PRSY
400.0000 mg | PREFILLED_SYRINGE | INTRAMUSCULAR | Status: AC
Start: 2023-01-13 — End: 2023-02-17
  Administered 2022-10-12 – 2023-02-17 (×4): 400 mg via INTRAMUSCULAR

## 2023-01-13 NOTE — Progress Notes (Signed)
NURSES NOTE:   Pt arrived for her monthly Abilify Maintena 400 mg injection. Pt's medication is provided by a sample supply from the [employer] [facility]. Last injection was 12/06/2022. Pt received injection, in left upper gluteal area. Tolerated well. Pt was pleasant. Pt reports while I was out of office last week she called to report feeling very down and hopeless, a lot of circumstantial situations occurring. She reports no plans of suicide or anything like that but thoughts of not being around anymore came to mind. Pt reports feeling better today, she reports it was just a dark feeling that day after visiting with her Mom who is suffering with dementia/alzheimer's. Pt isn't speaking with her son right now, she is also looking for a job. I did discuss her insurance now and she is out of network so with her high balance on top of that we would be referring her out. Informed her we would continue her care for 3 months until establishing with a new provider. I asked to see her insurance card so I could check on her options but she did not have it with her. Instructed her to give me a call back and I can discuss other providers for her. She was very appreciative of my help with this. Not upset about having to change her care. I reviewed her letter with her indicating the plan for her care.  Pt advised to follow up in 28 days for her next injection unless she is seen by a new provider before then. Will update Melony Overly PA-C.    LOT  [id number] [id number] DEC 2026

## 2023-01-20 ENCOUNTER — Other Ambulatory Visit: Payer: Self-pay | Admitting: Physician Assistant

## 2023-02-17 ENCOUNTER — Ambulatory Visit (INDEPENDENT_AMBULATORY_CARE_PROVIDER_SITE_OTHER): Payer: BLUE CROSS/BLUE SHIELD

## 2023-02-17 DIAGNOSIS — F319 Bipolar disorder, unspecified: Secondary | ICD-10-CM | POA: Diagnosis not present

## 2023-02-17 NOTE — Progress Notes (Signed)
NURSES NOTE:   Pt arrived for her monthly Abilify Maintena 400 mg injection. Pt's medication is provided by a sample supply from the [employer]. Last injection was 01/13/2023. Pt received injection, in left upper gluteal area. Tolerated well. Pt was pleasant. No complaints reported, no manic talkative behavior.   Pt advised to follow up in 28 days for her next injection unless she is seen by a new provider before then. Will update Melony Overly PA-C.    LOT  [id number] EXP Dec 2026

## 2023-03-23 ENCOUNTER — Ambulatory Visit: Payer: BLUE CROSS/BLUE SHIELD

## 2023-03-23 DIAGNOSIS — F319 Bipolar disorder, unspecified: Secondary | ICD-10-CM | POA: Diagnosis not present

## 2023-03-27 ENCOUNTER — Ambulatory Visit: Payer: BLUE CROSS/BLUE SHIELD | Admitting: Physician Assistant

## 2023-04-05 NOTE — Progress Notes (Signed)
NURSES NOTE:   Pt arrived for her monthly Abilify Maintena 400 mg injection. Pt's medication is provided by a sample supply from the [employer]. Last injection was 02/17/2023. Pt received injection, in left upper gluteal area. Tolerated well. Pt was pleasant. No complaints reported, no manic talkative behavior.   Pt advised to follow up in 28 days for her next injection unless she is seen by a new provider before then. Will update Melony Overly PA-C.    LOT  [id number] EXP Dec 2026

## 2023-04-06 MED ORDER — ARIPIPRAZOLE ER 400 MG IM PRSY
400.0000 mg | PREFILLED_SYRINGE | Freq: Once | INTRAMUSCULAR | Status: AC
Start: 2023-04-06 — End: 2023-03-23
  Administered 2023-03-23: 400 mg via INTRAMUSCULAR

## 2023-05-01 ENCOUNTER — Ambulatory Visit: Payer: BLUE CROSS/BLUE SHIELD

## 2023-05-02 ENCOUNTER — Ambulatory Visit: Payer: BLUE CROSS/BLUE SHIELD

## 2023-05-02 DIAGNOSIS — F319 Bipolar disorder, unspecified: Secondary | ICD-10-CM

## 2023-05-02 MED ORDER — ARIPIPRAZOLE ER 400 MG IM PRSY
400.0000 mg | PREFILLED_SYRINGE | INTRAMUSCULAR | Status: AC
Start: 2023-05-02 — End: 2023-07-25
  Administered 2023-05-02 – 2023-06-15 (×2): 400 mg via INTRAMUSCULAR

## 2023-05-02 NOTE — Progress Notes (Signed)
NURSES NOTE:   Pt arrived for her monthly Abilify Maintena 400 mg injection. Pt's medication is provided by a sample supply from the [employer]. Today's injection is a bit more than 28 days due to waiting on shipment of medication. Last injection was 03/23/2023. Pt received injection, in left upper gluteal area. Tolerated well. Pt was pleasant. No complaints reported, no manic talkative behavior.   Pt advised to follow up in 28 days for her next injection unless she is seen by a new provider before then. Will update Melony Overly PA-C.    LOT  [id number] EXP Dec 2026 [id number]

## 2023-05-05 ENCOUNTER — Other Ambulatory Visit: Payer: Self-pay

## 2023-05-05 ENCOUNTER — Ambulatory Visit
Admission: EM | Admit: 2023-05-05 | Discharge: 2023-05-05 | Disposition: A | Discharge status: Home / Self Care | Payer: BLUE CROSS/BLUE SHIELD | Attending: Emergency Medicine | Admitting: Emergency Medicine

## 2023-05-05 ENCOUNTER — Encounter: Payer: Self-pay | Admitting: Emergency Medicine

## 2023-05-05 DIAGNOSIS — L0390 Cellulitis, unspecified: Secondary | ICD-10-CM | POA: Diagnosis not present

## 2023-05-05 DIAGNOSIS — W57XXXA Bitten or stung by nonvenomous insect and other nonvenomous arthropods, initial encounter: Secondary | ICD-10-CM | POA: Diagnosis not present

## 2023-05-05 MED ORDER — CEPHALEXIN 500 MG PO CAPS
500.0000 mg | ORAL_CAPSULE | Freq: Four times a day (QID) | ORAL | 0 refills | Status: DC
Start: 2023-05-05 — End: 2023-07-11

## 2023-05-05 NOTE — Discharge Instructions (Addendum)
Take the cephalexin as directed.  Follow up with your primary care provider on Monday.  Go to the emergency department if you have worsening symptoms.

## 2023-05-05 NOTE — ED Triage Notes (Signed)
Patient has a small bump on left upper arm, just above elbow.  One bump to right elbow.  Both areas are surrounded by redness, warm to the touch, and itch.  Reports she has an area on left thigh.  All areas noticed yesterday.  Person sharing her home does not have any of these areas on him per patient
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
## 2023-06-12 ENCOUNTER — Ambulatory Visit (INDEPENDENT_AMBULATORY_CARE_PROVIDER_SITE_OTHER): Payer: BLUE CROSS/BLUE SHIELD | Admitting: Family Medicine

## 2023-06-12 VITALS — BP 131/85 | HR 87 | Ht 64.0 in | Wt 176.4 lb

## 2023-06-12 DIAGNOSIS — R1032 Left lower quadrant pain: Secondary | ICD-10-CM

## 2023-06-12 MED ORDER — MELOXICAM 15 MG PO TABS: 15.0000 mg | ORAL_TABLET | Freq: Every day | ORAL | 0 refills | Status: DC

## 2023-06-12 NOTE — Patient Instructions (Addendum)
 I have attached information about hernias  Let's get a CT scan to see if we can find anything causing your pain - we will give you a call to schedule this  In the meantime continue taking ibuprofen /acetaminophen  for pain  I have sent a few pills of meloxicam  to your pharmacy - DO NOT take this with ibuprofen   Please seek medical attention if you have sudden severe worsening of pain, nausea/vomiting with your pain, and/or are unable to keep down any food/liquids

## 2023-06-12 NOTE — Progress Notes (Signed)
    SUBJECTIVE:   CHIEF COMPLAINT / HPI:   Sudden onset L groin pain x 1 week Sharp, constant, nonradiating, 8/10. Ibuprofen  hasn't helped much though it does help temporarily - taking this here and there.  Notices coughing or sneezing, valsalva makes it worse Having normal BM - denies constipation/diarrhea, no blood in stool No hematuria, dysuria, vaginal discharge/bleeding, fevers  PERTINENT  PMH / PSH: bipolar  OBJECTIVE:   BP 131/85   Pulse 87   Ht 5' 4 (1.626 m)   Wt 176 lb 6.4 oz (80 kg)   SpO2 100%   BMI 30.28 kg/m   General: NAD, pleasant, able to participate in exam Respiratory: No respiratory distress Abdominal: Soft, nondistended, moderately tender to palpation in LLQ and along left inguinal canal.  No significant masses or hernias palpated.  No CVA tenderness.  Otherwise nontender. MSK: L Hip nontender throughout iliac crest and hip aside from inguinal canal as above. Normal ROM without pain aside from flexion which causes pain due to compression of inguinal region. Negative FABER/FADIR Skin: warm and dry, no rashes noted Psych: Normal affect and mood  ASSESSMENT/PLAN:   Assessment & Plan Left lower quadrant abdominal pain Given LLQ and left inguinal canal pain/tenderness with history of worsening pain with Valsalva, concern for possible inguinal hernia.  I reviewed her CT from 2020 which did not show evidence of a hernia.  However, given her symptoms we will obtain repeat CT abdomen/pelvis with contrast for further evaluation.  Currently no evidence of incarceration or obstruction but discussed return precautions. Per patient preference also sent in short course of meloxicam  for pain.  Less likely causes include IBD/IBS/diverticulitis (no hematochezia, BM changes), pelvic/ovarian etiology (no vaginal bleeding/discharge, location of pain makes this less likely), appendicitis (atypical location, no fevers or systemic symptoms), MSK strain or arthritis (unremarkable MSK  exam, no bony tenderness), nephrolithiasis or UTI (no CVA/flank pain/tenderness, no hematuria or dysuria/urinary changes)   Payton Coward, MD [facility]

## 2023-06-15 ENCOUNTER — Encounter: Payer: Self-pay | Admitting: Family Medicine

## 2023-06-15 ENCOUNTER — Encounter (HOSPITAL_COMMUNITY): Payer: Self-pay | Admitting: Family Medicine

## 2023-06-15 ENCOUNTER — Telehealth: Payer: Self-pay

## 2023-06-15 ENCOUNTER — Ambulatory Visit (INDEPENDENT_AMBULATORY_CARE_PROVIDER_SITE_OTHER): Payer: BLUE CROSS/BLUE SHIELD

## 2023-06-15 DIAGNOSIS — F319 Bipolar disorder, unspecified: Secondary | ICD-10-CM | POA: Diagnosis not present

## 2023-06-15 NOTE — Progress Notes (Signed)
 NURSES NOTE:   Pt arrived for her monthly Abilify  Maintena 400 mg injection. Pt's medication is provided by a sample supply from the [employer]. Today's injection is a bit more than 28 days due to the holidays. Last injection was 05/02/2023. Pt received injection, in left upper gluteal area. Tolerated well. Pt was pleasant. No complaints reported, no manic talkative behavior.   Pt is receiving her last injection at this practice, due to her insurance changes she will be following up with new provider. She was appreciative of all the care she received here, informed her I would let Verneita know. Advised to follow up in 28 days for her next injection at the new practice.    LOT  [id number] [id number] Dec 2026 [id number] AB

## 2023-06-15 NOTE — Telephone Encounter (Signed)
 Patient calls nurse line requesting letter related to visit on Monday, 06/12/23.   She is requesting letter that restricts her from lifting anything heavy at work. She works at [employer] is asking that she get letter from provider with this stated.   Patient is requesting that this is sent through mychart.   Will forward to Dr. Romelle.   Chiquita JAYSON English, RN

## 2023-06-16 NOTE — Telephone Encounter (Signed)
 Called and LVM advising patient of completion of letter.   Instructed to call back if patient had any issues with accessing letter.   Veronda Prude, RN

## 2023-06-22 ENCOUNTER — Ambulatory Visit (INDEPENDENT_AMBULATORY_CARE_PROVIDER_SITE_OTHER): Payer: BLUE CROSS/BLUE SHIELD | Admitting: Family Medicine

## 2023-06-22 ENCOUNTER — Ambulatory Visit
Admission: RE | Admit: 2023-06-22 | Discharge: 2023-06-22 | Disposition: A | Discharge status: Home / Self Care | Payer: MEDICAID | Source: Ambulatory Visit | Attending: Family Medicine | Admitting: Family Medicine

## 2023-06-22 ENCOUNTER — Encounter: Payer: Self-pay | Admitting: Family Medicine

## 2023-06-22 VITALS — BP 146/84 | HR 94 | Ht 64.0 in | Wt 173.8 lb

## 2023-06-22 DIAGNOSIS — M25552 Pain in left hip: Secondary | ICD-10-CM

## 2023-06-22 DIAGNOSIS — R1032 Left lower quadrant pain: Secondary | ICD-10-CM

## 2023-06-22 MED ORDER — HYDROCODONE-ACETAMINOPHEN 5-325 MG PO TABS
1.0000 | ORAL_TABLET | Freq: Three times a day (TID) | ORAL | 0 refills | Status: AC | PRN
Start: 2023-06-22 — End: 2023-06-27

## 2023-06-22 NOTE — Progress Notes (Signed)
    SUBJECTIVE:   CHIEF COMPLAINT / HPI:   L groin pain -was seen on 06/12/23 for same complaint, given short course of meloxicam and scheduled for CTAP due to concern for inguinal hernia -states pain has gotten worse, constant 10/10 now compared with 8/10 at last visit -same location, along L inguinal canal -worse after standing for long periods of time and with valsalva -has taken tylenol, ibuprofen, a few meloxicam, none of these helped much -has had diarrhea since last weekend, no changes in diet or sick contacts -vomited once yesterday -denies abdominal pain currently  -eating and drinking well -no fevers or chills -no back pain -no vaginal bleeding or discharge -no dysuria  -no bloody stools  PERTINENT  PMH / PSH: GERD, bipolar 1  OBJECTIVE:   BP (!) 146/84   Pulse 94   Ht 5\' 4"  (1.626 m)   Wt 173 lb 12.8 oz (78.8 kg)   SpO2 97%   BMI 29.83 kg/m    Gen: sitting in exam room, awake and conversant, in NAD Pulm: even, nonlabored breathing on RA Abd: normoactive bowel sounds, significant tenderness to palpation along L inguinal canal and moderate tenderness to palpation diffusely across abdomen, no guarding or peritonitis MSK: significant pain with internal and external rotation of L hip  GU (performed with chaperone, Dr Denny Levy): no masses palpated with bimanual exam, though this exam was limited due to patient discomfort Skin: warm, dry, no rashes or lesions Psych: appropriate mood and affect  ASSESSMENT/PLAN:   Left groin pain Ongoing pain at L inguinal region. Due to location at [facility] and new report of GI upset, diverticulitis remains on the differential. Inguinal hernia is also possible given location and worsening pain with valsalva. L hip osteoarthritis is another possibility given significant pain with isolated hip movement. Pelvic pathology is also possible but less likely given no uterine bleeding/discharge and pain localized to L hip.  Patient is  hemodynamically stable, afebrile, appears well hydrated and is not peritonitic or showing signs of bowel obstruction at this time.   Will manage pain with short course of hydrocodone-acetaminophen. Will also order BL hip/pelvis x-rays to evaluate for osteoarthritis. Pt has CTAP scheduled for the end of the month, advised patient to keep this appointment. Reviewed return precautions. Scheduled for close follow up early next week.    Lorayne Bender, MD [facility]

## 2023-06-22 NOTE — Patient Instructions (Signed)
Thank you for coming in today! Here is a summary of what we discussed:  I have placed an order for hip x rays.  Please go to [facility] at [employer] [facility] or at [facility] to have this completed.  You do not need an appointment, but if you would like to call them beforehand, their number is [phone].  We will contact you with your results afterwards.   If you haven't already, sign up for My Chart to have easy access to your labs results, and communication with your primary care physician.  I recommend that you always bring your medications to each appointment as this makes it easy to ensure you are on the correct medications and helps [place] not miss refills when you need them.  Please call the clinic at [phone] if your symptoms worsen or you have any concerns.  Best, Dr Dolan Amen

## 2023-06-22 NOTE — Assessment & Plan Note (Signed)
Ongoing pain at L inguinal region. Due to location at [facility] and new report of GI upset, diverticulitis remains on the differential. Inguinal hernia is also possible given location and worsening pain with valsalva. L hip osteoarthritis is another possibility given significant pain with isolated hip movement. Pelvic pathology is also possible but less likely given no uterine bleeding/discharge and pain localized to L hip.  Patient is hemodynamically stable, afebrile, appears well hydrated and is not peritonitic or showing signs of bowel obstruction at this time.   Will manage pain with short course of hydrocodone-acetaminophen. Will also order BL hip/pelvis x-rays to evaluate for osteoarthritis. Pt has CTAP scheduled for the end of the month, advised patient to keep this appointment. Reviewed return precautions. Scheduled for close follow up early next week.

## 2023-06-26 ENCOUNTER — Encounter: Payer: Self-pay | Admitting: Family Medicine

## 2023-06-26 ENCOUNTER — Ambulatory Visit (INDEPENDENT_AMBULATORY_CARE_PROVIDER_SITE_OTHER): Payer: MEDICAID | Admitting: Family Medicine

## 2023-06-26 VITALS — BP 128/83 | HR 89 | Ht 64.0 in | Wt 172.8 lb

## 2023-06-26 DIAGNOSIS — R1032 Left lower quadrant pain: Secondary | ICD-10-CM

## 2023-06-26 NOTE — Patient Instructions (Signed)
Good to see you today - Thank you for coming in  Things we discussed today:  1) We will see what your CT scan shows.   2) Follow-up with your OBGYN. Let them know about the bleeding you had last year.   Please seek further medical attention if you: - lose consciousness or feel close to fainting - Abdominl pain is uncontrolled. - are vomiting uncontrollably - are unable to drink enough to stay hydrated - have fevers of 100.76F or higher for 3 days in a row

## 2023-06-26 NOTE — Progress Notes (Unsigned)
    SUBJECTIVE:   CHIEF COMPLAINT / HPI:    Kelly Carr is a 53yo F w/ hx of LLQ/L groin pain that p/f f/u of L groin pain.   L groin - Reports that the pain is about the same as before - Medication at prior visit is helping (Norco) - Reports eating and drinking normally - Having some loose stool - No blood in stool - No nausea or vomiting - Denies dysuria - Denies vaginal bleeding or discharge - Is post-menopausal.  Has appt w/ OBGYN on 23rd.   OBJECTIVE:   BP (!) 145/93   Pulse 89   Ht 5\' 4"  (1.626 m)   Wt 172 lb 12.8 oz (78.4 kg)   SpO2 98%   BMI 29.66 kg/m   General: Alert, pleasant nontoxic-appearing woman. NAD. HEENT: NCAT. MMM. CV: RRR, no murmurs. Resp: CTAB, no wheezing or crackles. Normal WOB on RA.  Abm: LLQ tenderness, no masses palpated. More tender over LLQ abm than L groin/hip region.  No rebound, guarding, rigidity. Ext: Moves all ext spontaneously Skin: Warm, well perfused   ASSESSMENT/PLAN:   Assessment & Plan Left lower quadrant abdominal pain Stable compared to prior visit, pain is more controlled with Norco.  No peritoneal signs on exam, vital signs stable, tolerating p.o. well, afebrile, no blood in stool.  Differential at this time includes GI etiology such as diverticulosis and hernias, GU such as ovarian or uterine abnormalities, MSK such as left osteoarthritis.  Already has CT scan scheduled, this will help workup intra-abdominal etiologies.  Has follow-up scheduled OB/GYN, this can help with workup of GU etiologies.  X-ray already performed which showed mild osteoarthritis, would not explain this degree of pain. -Follow-up CT scan and OB/GYN visit results.  Counseled on ED and return precautions.   Lincoln Brigham, MD [facility]

## 2023-06-28 ENCOUNTER — Encounter: Payer: Self-pay | Admitting: Family Medicine

## 2023-06-29 ENCOUNTER — Encounter: Payer: Self-pay | Admitting: Obstetrics & Gynecology

## 2023-06-29 ENCOUNTER — Ambulatory Visit (INDEPENDENT_AMBULATORY_CARE_PROVIDER_SITE_OTHER): Payer: BLUE CROSS/BLUE SHIELD | Admitting: Obstetrics & Gynecology

## 2023-06-29 VITALS — BP 118/78 | HR 83 | Wt 171.0 lb

## 2023-06-29 DIAGNOSIS — R102 Pelvic and perineal pain: Secondary | ICD-10-CM | POA: Diagnosis not present

## 2023-06-29 NOTE — Progress Notes (Signed)
GYNECOLOGY OFFICE VISIT NOTE  History:   Kelly Carr is a 53 y.o. [id number] here today for evaluation of pelvic pain for a few weeks.  Already scheduled for CT scan on 07/03/23. Pain can be 7-10/10 when it occurs.  No periods since 12/2022.  Has some hot flashes and night sweats, no other symptoms. She denies any abnormal vaginal discharge, bleeding,  or other concerns.    Past Medical History:  Diagnosis Date   Abnormal Pap smear    Unknown results>colpo>normal   Abscess 10/14/2021   Anxiety    Arthritis    Asthma    Bipolar 1 disorder (HCC)    Cervical strain 05/04/2021   Depression    Forearm strain, left, initial encounter 05/04/2021   Labral tear of hip joint 12/27/2014   Orbital floor (blow-out) closed fracture (HCC) 10/17/2018   Orbital floor (blow-out) closed fracture (HCC) 10/17/2018   Paresthesia and pain of extremity 04/07/2021   PTSD (post-traumatic stress disorder)    Rotator cuff tendinitis, right 05/04/2021    Past Surgical History:  Procedure Laterality Date   NO PAST SURGERIES      The following portions of the patient's history were reviewed and updated as appropriate: allergies, current medications, past family history, past medical history, past social history, past surgical history and problem list.   Health Maintenance:  Normal pap and negative HRHPV on 09/05/2022.  Mammogram scheduled 07/06/2023.   Review of Systems:  Pertinent items noted in HPI and remainder of comprehensive ROS otherwise negative.  Physical Exam:  BP 118/78   Pulse 83   Wt 171 lb (77.6 kg)   BMI 29.35 kg/m  CONSTITUTIONAL: Well-developed, well-nourished female in no acute distress.  HEENT:  Normocephalic, atraumatic. External right and left ear normal. No scleral icterus.  NECK: Normal range of motion, supple, no masses noted on observation SKIN: No rash noted. Not diaphoretic. No erythema. No pallor. MUSCULOSKELETAL: Normal range of motion. No edema noted. NEUROLOGIC: Alert  and oriented to person, place, and time. Normal muscle tone coordination. No cranial nerve deficit noted. PSYCHIATRIC: Normal mood and affect. Normal behavior. Normal judgment and thought content. CARDIOVASCULAR: Normal heart rate noted RESPIRATORY: Effort and breath sounds normal, no problems with respiration noted ABDOMEN: No masses noted. No other overt distention noted.  No pain currently but points to left groin/LLQ area as position of pain. PELVIC: Deferred  Labs and Imaging DG HIPS BILAT WITH PELVIS 3-4 VIEWS Result Date: 06/22/2023 CLINICAL DATA:  Left groin pain for 3 weeks. EXAM: DG HIP (WITH OR WITHOUT PELVIS) 3-4V BILAT COMPARISON:  None Available. FINDINGS: There are no signs of acute fracture or dislocation. Mild subchondral sclerosis identified within bilateral acetabula. Soft tissues are unremarkable. IMPRESSION: 1. No acute findings. 2. Mild bilateral hip osteoarthritis. Electronically Signed   By: Signa Kell M.D.   On: 06/22/2023 16:16      Assessment and Plan:     1. Pelvic pain (Primary) Unsure etiology: could be GYN, GU, GI, nervous, musculoskeletal/myofascial. CT scan already ordered. If GYN pathology seen, may need further characterization with pelvic ultrasound and will order accordingly. No intervention for now.  Routine preventative health maintenance measures emphasized. Please refer to After Visit Summary for other counseling recommendations.   Return for After 09/05/2023 for annual exam or earlier as needed.    I spent 25 minutes dedicated to the care of this patient including pre-visit review of records, face to face time with the patient discussing her conditions and treatments and  post visit orders.    Jaynie Collins, MD, FACOG Obstetrician & Gynecologist, [facility] for [employer], [facility]

## 2023-06-29 NOTE — Patient Instructions (Signed)
Patient with bothersome menopausal  symptoms. Discussed lifestyle interventions such as wearing light clothing, remaining in cool environments, having fan/air conditioner in the room, avoiding hot beverages etc.  Discussed using hormone therapy and concerns about increased risk of heart disease, strokes, blood clots  and breast cancer.  Also discussed other medical options such as Paxil, Effexor, Brisdelle or Neurontin.  There is also a new nonhormonal medication called Veozah, which is FDA-approved for menopausal  symptoms.   Also discussed alternative therapies such as herbal remedies but cautioned that most of the products contained phytoestrogens (plant estrogens) in unregulated amounts which can have the same effects on the body as the pharmaceutical estrogen preparations.  Also referred her to www.[email or web address].org for other alternative options.

## 2023-07-03 ENCOUNTER — Ambulatory Visit
Admission: RE | Admit: 2023-07-03 | Discharge: 2023-07-03 | Disposition: A | Discharge status: Home / Self Care | Payer: BLUE CROSS/BLUE SHIELD | Source: Ambulatory Visit | Attending: Family Medicine | Admitting: Family Medicine

## 2023-07-03 DIAGNOSIS — R1032 Left lower quadrant pain: Secondary | ICD-10-CM

## 2023-07-03 MED ORDER — IOPAMIDOL (ISOVUE-300) INJECTION 61%
100.0000 mL | Freq: Once | INTRAVENOUS | Status: AC | PRN
  Administered 2023-07-03: 100 mL via INTRAVENOUS

## 2023-07-04 ENCOUNTER — Telehealth: Payer: Self-pay

## 2023-07-04 NOTE — Telephone Encounter (Signed)
Pt contacted office and left message that she has a scheduled apt with [facility] the end of March 2025 and asking if she can receive her injection of Abilify Maintena until her apt.  Contacted pt and informed her she would be due next week, as her last injection was 06/15/23. Informed her I didn't see a problem in her getting her next dose but I would discuss with [name] to make sure she agrees.    Pt reports having apt at end of March with [facility] but I do not see a apt on my end. I believe we would see it scheduled?

## 2023-07-04 NOTE — Telephone Encounter (Signed)
You're right, we should be able to see the appt if it was scheduled.  No we can't continue injections here. She was given ample time to find another med management provider, the letter was sent in July 2024 informing her to do so.  Recommend she contact [facility] or see someone at [facility] as she reports having an appt there.

## 2023-07-05 NOTE — Telephone Encounter (Signed)
Pt informed that she is unable to get injection here. She wanted to talk to admin staff about her records being sent to [facility] because they report they haven't received them yet.

## 2023-07-06 ENCOUNTER — Ambulatory Visit
Admission: RE | Admit: 2023-07-06 | Discharge: 2023-07-06 | Disposition: A | Discharge status: Home / Self Care | Payer: MEDICAID | Source: Ambulatory Visit | Attending: Obstetrics & Gynecology | Admitting: Obstetrics & Gynecology

## 2023-07-06 DIAGNOSIS — Z1231 Encounter for screening mammogram for malignant neoplasm of breast: Secondary | ICD-10-CM | POA: Insufficient documentation

## 2023-07-07 ENCOUNTER — Ambulatory Visit (INDEPENDENT_AMBULATORY_CARE_PROVIDER_SITE_OTHER): Payer: MEDICAID | Admitting: Student

## 2023-07-07 ENCOUNTER — Encounter: Payer: Self-pay | Admitting: Student

## 2023-07-07 VITALS — BP 112/70 | HR 99 | Temp 98.0°F | Wt 175.0 lb

## 2023-07-07 DIAGNOSIS — J3489 Other specified disorders of nose and nasal sinuses: Secondary | ICD-10-CM | POA: Diagnosis not present

## 2023-07-07 DIAGNOSIS — F312 Bipolar disorder, current episode manic severe with psychotic features: Secondary | ICD-10-CM

## 2023-07-07 DIAGNOSIS — U071 COVID-19: Secondary | ICD-10-CM | POA: Diagnosis not present

## 2023-07-07 MED ORDER — ARIPIPRAZOLE 20 MG PO TABS: 20.0000 mg | ORAL_TABLET | Freq: Every day | ORAL | 1 refills | Status: DC

## 2023-07-07 NOTE — Progress Notes (Unsigned)
    SUBJECTIVE:   CHIEF COMPLAINT / HPI:   COVID-19 Home COVID test 2 days ago.  Having some general malaise, fatigue, but feeling okay otherwise.  Able to eat and drink.  No fevers that she is aware of..  No difficulty breathing.  Bipolar Disorder Had a change of insurance at the first of the year and therefore is having to change psychiatric providers.  The challenge here is that because of this change she will be without her Abilify long-acting injection for the next 3 months.  This is concerning to her because she is very afraid of having a manic episode off of the Abilify.  She reports that she was doing really quite well on her previous dose of 400 mg every 28 days.  Nasal Vestibulitis  Longstanding issue.  Frequently gets pimples right at the nasal vestibule.  Tells me that she was referred to ENT for this in the past but has not tried any other therapies for it.  She never made the ENT appointment due to insurance issues.  She now has Medicaid and presumably could pursue specialist care if needed.  PERTINENT  PMH / PSH: BPD  OBJECTIVE:   BP 112/70   Pulse 99   Temp 98 F (36.7 C) (Oral)   Wt 175 lb (79.4 kg)   SpO2 98%   BMI 30.04 kg/m   Gen: Well-appearing and NAD, in good spirits HENT: There is a small pustule at the vestibule of the L nare, minimal erythema and no edema. MMM, oropharynx clear  Pulm: Normal WOB on RA, speaking in full sentences, lung sounds normal  Ext: Without edema or deformity  Psych: Mood and affect are WNL  ASSESSMENT/PLAN:   Assessment & Plan COVID-19 Relatively mild symptoms, anticipate self resolving course.  Reviewed conservative management.  No need for further workup or intervention at this time. Severe manic bipolar 1 disorder with psychotic behavior (HCC) Mood and affect are normal today. Very unfortunate she is going to have a gap in her long-acting injectable coverage.  I attempted to order her LAI out of our office but it appears this  can only be ordered by psychiatric providers.  Therefore discussed with her transitioning to oral Abilify until she has her appointment to establish care with her new psychiatrist in March.  She is amenable to this.  Discussed with our pharmacy team that the best equivalent dose would be 20 mg/day. -Have written a prescription for Abilify 20 mg p.o. daily -She is aware of psychiatric urgent care services available at the [address] location should she need it  Nasal vestibulitis Small pustule at the nasal vestibule.  Will treat this with topical Bactroban for now.  Holding off on ENT referral for this minor issue, though should symptoms prove difficult to control or other issues arise, would happily place referral.    J Dorothyann Gibbs, MD [facility]

## 2023-07-07 NOTE — Patient Instructions (Signed)
Kelly Carr, It is so lovely to meet you.  I have written a prescription for oral Abilify to cover you until you can get restarted on the injectables with your new psychiatry team.  Unfortunately, it does not appear that I am able to order this as a nonpsychiatric provider.  Obviously, the oral option is not nearly as convenient as the injectable but should help to at least keep you stable and out of the hospital. The spot in your nose is what we call nasal vestibulitis, lets treat this with just topical antibiotic ointment.  You should apply this twice a day to the affected area. Our office will be in touch as soon as we have the CT report.  Eliezer Mccoy, MD

## 2023-07-08 NOTE — Assessment & Plan Note (Signed)
Mood and affect are normal today. Very unfortunate she is going to have a gap in her long-acting injectable coverage.  I attempted to order her LAI out of our office but it appears this can only be ordered by psychiatric providers.  Therefore discussed with her transitioning to oral Abilify until she has her appointment to establish care with her new psychiatrist in March.  She is amenable to this.  Discussed with our pharmacy team that the best equivalent dose would be 20 mg/day. -Have written a prescription for Abilify 20 mg p.o. daily -She is aware of psychiatric urgent care services available at the [address] location should she need it

## 2023-07-10 ENCOUNTER — Encounter: Payer: Self-pay | Admitting: Obstetrics & Gynecology

## 2023-07-10 ENCOUNTER — Emergency Department: Admission: EM | Admit: 2023-07-10 | Discharge: 2023-07-10 | Discharge status: Against Medical Advice | Payer: MEDICAID

## 2023-07-10 ENCOUNTER — Other Ambulatory Visit: Payer: Self-pay | Admitting: Family Medicine

## 2023-07-10 ENCOUNTER — Emergency Department (HOSPITAL_COMMUNITY): Admission: EM | Admit: 2023-07-10 | Payer: MEDICAID | Source: Home / Self Care

## 2023-07-10 ENCOUNTER — Encounter: Payer: Self-pay | Admitting: Family Medicine

## 2023-07-10 DIAGNOSIS — K5790 Diverticulosis of intestine, part unspecified, without perforation or abscess without bleeding: Secondary | ICD-10-CM

## 2023-07-11 ENCOUNTER — Emergency Department (HOSPITAL_COMMUNITY): Payer: MEDICAID

## 2023-07-11 ENCOUNTER — Other Ambulatory Visit: Payer: Self-pay

## 2023-07-11 ENCOUNTER — Encounter (HOSPITAL_COMMUNITY): Payer: Self-pay

## 2023-07-11 ENCOUNTER — Inpatient Hospital Stay (HOSPITAL_COMMUNITY)
Admission: EM | Admit: 2023-07-11 | Discharge: 2023-07-12 | DRG: 885 | Disposition: A | Discharge status: Home / Self Care | Payer: MEDICAID | Attending: Family Medicine | Admitting: Family Medicine

## 2023-07-11 DIAGNOSIS — Z9151 Personal history of suicidal behavior: Secondary | ICD-10-CM

## 2023-07-11 DIAGNOSIS — Z885 Allergy status to narcotic agent status: Secondary | ICD-10-CM | POA: Diagnosis not present

## 2023-07-11 DIAGNOSIS — R410 Disorientation, unspecified: Secondary | ICD-10-CM | POA: Diagnosis not present

## 2023-07-11 DIAGNOSIS — F29 Unspecified psychosis not due to a substance or known physiological condition: Secondary | ICD-10-CM | POA: Diagnosis present

## 2023-07-11 DIAGNOSIS — Z818 Family history of other mental and behavioral disorders: Secondary | ICD-10-CM

## 2023-07-11 DIAGNOSIS — Z888 Allergy status to other drugs, medicaments and biological substances status: Secondary | ICD-10-CM | POA: Diagnosis not present

## 2023-07-11 DIAGNOSIS — T7611XA Adult physical abuse, suspected, initial encounter: Secondary | ICD-10-CM | POA: Diagnosis present

## 2023-07-11 DIAGNOSIS — J45909 Unspecified asthma, uncomplicated: Secondary | ICD-10-CM | POA: Diagnosis present

## 2023-07-11 DIAGNOSIS — G8929 Other chronic pain: Secondary | ICD-10-CM | POA: Diagnosis present

## 2023-07-11 DIAGNOSIS — F319 Bipolar disorder, unspecified: Principal | ICD-10-CM | POA: Diagnosis present

## 2023-07-11 DIAGNOSIS — I10 Essential (primary) hypertension: Secondary | ICD-10-CM | POA: Diagnosis present

## 2023-07-11 DIAGNOSIS — E785 Hyperlipidemia, unspecified: Secondary | ICD-10-CM | POA: Diagnosis present

## 2023-07-11 DIAGNOSIS — Z8616 Personal history of COVID-19: Secondary | ICD-10-CM

## 2023-07-11 DIAGNOSIS — Z79899 Other long term (current) drug therapy: Secondary | ICD-10-CM

## 2023-07-11 DIAGNOSIS — R400 Somnolence: Secondary | ICD-10-CM

## 2023-07-11 DIAGNOSIS — F312 Bipolar disorder, current episode manic severe with psychotic features: Secondary | ICD-10-CM | POA: Diagnosis not present

## 2023-07-11 DIAGNOSIS — Z881 Allergy status to other antibiotic agents status: Secondary | ICD-10-CM

## 2023-07-11 DIAGNOSIS — Z87891 Personal history of nicotine dependence: Secondary | ICD-10-CM | POA: Diagnosis not present

## 2023-07-11 DIAGNOSIS — F310 Bipolar disorder, current episode hypomanic: Secondary | ICD-10-CM | POA: Diagnosis not present

## 2023-07-11 DIAGNOSIS — F4310 Post-traumatic stress disorder, unspecified: Secondary | ICD-10-CM | POA: Diagnosis present

## 2023-07-11 DIAGNOSIS — R4182 Altered mental status, unspecified: Secondary | ICD-10-CM | POA: Diagnosis present

## 2023-07-11 DIAGNOSIS — Z8249 Family history of ischemic heart disease and other diseases of the circulatory system: Secondary | ICD-10-CM | POA: Diagnosis not present

## 2023-07-11 DIAGNOSIS — Z886 Allergy status to analgesic agent status: Secondary | ICD-10-CM

## 2023-07-11 DIAGNOSIS — F419 Anxiety disorder, unspecified: Secondary | ICD-10-CM | POA: Diagnosis present

## 2023-07-11 LAB — COMPREHENSIVE METABOLIC PANEL
ALT: 70 U/L — ABNORMAL HIGH (ref 0–44)
AST: 38 U/L (ref 15–41)
Albumin: 3.9 g/dL (ref 3.5–5.0)
Alkaline Phosphatase: 93 U/L (ref 38–126)
Anion gap: 12 (ref 5–15)
BUN: 13 mg/dL (ref 6–20)
CO2: 21 mmol/L — ABNORMAL LOW (ref 22–32)
Calcium: 9.5 mg/dL (ref 8.9–10.3)
Chloride: 107 mmol/L (ref 98–111)
Creatinine, Ser: 0.89 mg/dL (ref 0.44–1.00)
GFR, Estimated: >60 mL/min (ref >60)
Glucose, Bld: 143 mg/dL — ABNORMAL HIGH (ref 70–99)
Potassium: 3.8 mmol/L (ref 3.5–5.1)
Sodium: 140 mmol/L (ref 135–145)
Total Bilirubin: 0.7 mg/dL (ref 0.0–1.2)
Total Protein: 7.3 g/dL (ref 6.5–8.1)

## 2023-07-11 LAB — RAPID URINE DRUG SCREEN, HOSP PERFORMED
Amphetamines: NOT DETECTED
Barbiturates: NOT DETECTED
Benzodiazepines: NOT DETECTED
Cocaine: NOT DETECTED
Opiates: POSITIVE — AB
Tetrahydrocannabinol: POSITIVE — AB

## 2023-07-11 LAB — I-STAT VENOUS BLOOD GAS, ED
Acid-base deficit: 2 mmol/L (ref 0.0–2.0)
Bicarbonate: 23.1 mmol/L (ref 20.0–28.0)
Calcium, Ion: 1.14 mmol/L — ABNORMAL LOW (ref 1.15–1.40)
HCT: 37 % (ref 36.0–46.0)
Hemoglobin: 12.6 g/dL (ref 12.0–15.0)
O2 Saturation: 64 %
Patient temperature: 36.9
Potassium: 4.7 mmol/L (ref 3.5–5.1)
Sodium: 139 mmol/L (ref 135–145)
TCO2: 24 mmol/L (ref 22–32)
pCO2, Ven: 41.3 mm[Hg] — ABNORMAL LOW (ref 44–60)
pH, Ven: 7.355 (ref 7.25–7.43)
pO2, Ven: 35 mm[Hg] (ref 32–45)

## 2023-07-11 LAB — CBC WITH DIFFERENTIAL/PLATELET
Abs Immature Granulocytes: 0.06 10*3/uL (ref 0.00–0.07)
Basophils Absolute: 0.1 10*3/uL (ref 0.0–0.1)
Basophils Relative: 0 %
Eosinophils Absolute: 0.3 10*3/uL (ref 0.0–0.5)
Eosinophils Relative: 2 %
HCT: 39.7 % (ref 36.0–46.0)
Hemoglobin: 12.9 g/dL (ref 12.0–15.0)
Immature Granulocytes: 1 %
Lymphocytes Relative: 17 %
Lymphs Abs: 1.9 10*3/uL (ref 0.7–4.0)
MCH: 28.6 pg (ref 26.0–34.0)
MCHC: 32.5 g/dL (ref 30.0–36.0)
MCV: 88 fL (ref 80.0–100.0)
Monocytes Absolute: 0.8 10*3/uL (ref 0.1–1.0)
Monocytes Relative: 7 %
Neutro Abs: 8.5 10*3/uL — ABNORMAL HIGH (ref 1.7–7.7)
Neutrophils Relative %: 73 %
Platelets: 317 10*3/uL (ref 150–400)
RBC: 4.51 MIL/uL (ref 3.87–5.11)
RDW: 13.3 % (ref 11.5–15.5)
WBC: 11.5 10*3/uL — ABNORMAL HIGH (ref 4.0–10.5)
nRBC: 0 % (ref 0.0–0.2)

## 2023-07-11 LAB — ETHANOL: Alcohol, Ethyl (B): <10 mg/dL (ref <10)

## 2023-07-11 LAB — HEMOGLOBIN A1C
Hgb A1c MFr Bld: 5.5 % (ref 4.8–5.6)
Mean Plasma Glucose: 111.15 mg/dL

## 2023-07-11 LAB — AMMONIA: Ammonia: 66 umol/L — ABNORMAL HIGH (ref 9–35)

## 2023-07-11 LAB — LITHIUM LEVEL: Lithium Lvl: <0.06 mmol/L — ABNORMAL LOW (ref 0.60–1.20)

## 2023-07-11 LAB — HCG, SERUM, QUALITATIVE: Preg, Serum: NEGATIVE

## 2023-07-11 LAB — CBG MONITORING, ED: Glucose-Capillary: 150 mg/dL — ABNORMAL HIGH (ref 70–99)

## 2023-07-11 LAB — SALICYLATE LEVEL: Salicylate Lvl: <7 mg/dL — ABNORMAL LOW (ref 7.0–30.0)

## 2023-07-11 LAB — TSH: TSH: 0.673 u[IU]/mL (ref 0.350–4.500)

## 2023-07-11 LAB — ACETAMINOPHEN LEVEL: Acetaminophen (Tylenol), Serum: <10 ug/mL — ABNORMAL LOW (ref 10–30)

## 2023-07-11 MED ORDER — ARIPIPRAZOLE 10 MG PO TABS: 20.0000 mg | ORAL_TABLET | Freq: Every day | ORAL | Status: DC

## 2023-07-11 MED ORDER — NALOXONE HCL 4 MG/0.1ML NA LIQD
1.0000 | Freq: Once | NASAL | Status: AC
  Administered 2023-07-11: 1 via NASAL
  Filled 2023-07-11: qty 4

## 2023-07-11 MED ORDER — ALBUTEROL SULFATE (2.5 MG/3ML) 0.083% IN NEBU
3.0000 mL | INHALATION_SOLUTION | Freq: Four times a day (QID) | RESPIRATORY_TRACT | Status: DC | PRN
Start: 2023-07-11 — End: 2023-07-12

## 2023-07-11 MED ORDER — ENOXAPARIN SODIUM 40 MG/0.4ML IJ SOSY: 40.0000 mg | PREFILLED_SYRINGE | INTRAMUSCULAR | Status: DC

## 2023-07-11 MED ORDER — SODIUM CHLORIDE 0.9 % IV BOLUS
1000.0000 mL | Freq: Once | INTRAVENOUS | Status: AC
Start: 2023-07-11 — End: 2023-07-11
  Administered 2023-07-11: 1000 mL via INTRAVENOUS

## 2023-07-11 NOTE — ED Notes (Signed)
 Pt remains asleep

## 2023-07-11 NOTE — ED Triage Notes (Signed)
 PT presents with eyes closed. Will not open eyes but will respond to verbal questions. Endorses that she smoked something and took cough medicine. Pt has several bags with her and feels cold. States she walked here. Pupils WNL and reactive. She is giggling at times and PA at bedside to assess.

## 2023-07-11 NOTE — Assessment & Plan Note (Signed)
 Given uncertainty of situation we will keep her differential broad at this time.  Further testing and interventions as below. -Admit to MTS, Dr. Delores attending - MedSurg - Will obtain Hgb A1c, TSH and urinalysis to rule out reversible causes of AMS -Repeat BMP in a.m. -CIWA and COWS assessments -Vitals per floor -Consult to psychiatry -PT OT to eval and treat

## 2023-07-11 NOTE — Progress Notes (Signed)
 CSW added substance abuse resources to patient's AVS.  Edwin Dada, MSW, LCSW Transitions of Care  Clinical Social Worker II [phone]

## 2023-07-11 NOTE — ED Provider Notes (Signed)
 [place] EMERGENCY DEPARTMENT AT [facility] Provider Note   CSN: [id number] Arrival date & time: 07/11/23  0410     History  Chief Complaint  Patient presents with   Shortness of Breath    Kelly Carr is a 53 y.o. female.  53 year old female presenting emergency department by EMS for altered mental status.  Reportedly told triage provider he took synthetic marijuana.  She is quite somnolent on my exam, but noted that she took a medication.  She is unsure what.  This was not a suicide attempt per her report.  However history limited by patient's somnolence.   Shortness of Breath      Home Medications Prior to Admission medications   Medication Sig Start Date End Date Taking? Authorizing Provider  ARIPiprazole  (ABILIFY ) 20 MG tablet Take 1 tablet (20 mg total) by mouth daily. 07/07/23  Yes Marlee Lynwood NOVAK, MD  cetirizine  (ZYRTEC ) 10 MG tablet Take 1 tablet (10 mg total) by mouth daily. Patient taking differently: Take 10 mg by mouth daily as needed for allergies. 11/04/22  Yes Macario Dorothyann HERO, MD  Dextromethorphan -guaiFENesin  (ROBITUSSIN DM PO) Take 10 mLs by mouth every 6 (six) hours as needed (cough).   Yes [provider]  HYDROcodone -acetaminophen  (NORCO/VICODIN) 5-325 MG tablet Take 1 tablet by mouth every 8 (eight) hours as needed for severe pain (pain score 7-10).   Yes [provider]      Allergies    Vibramycin  [doxycycline ], Celebrex  [celecoxib ], Depakene [valproic acid], Haldol  [haloperidol  lactate], Medrol  [methylprednisolone ], Neurontin  [gabapentin ], Risperidone and related, and Ultram  [tramadol ]    Review of Systems   Review of Systems  Respiratory:  Positive for shortness of breath.     Physical Exam Updated Vital Signs BP 114/73   Pulse 83   Temp 97.8 F (36.6 C) (Oral)   Resp 12   Ht 5' 4 (1.626 m)   Wt 79.4 kg   SpO2 100%   BMI 30.04 kg/m  Physical Exam Vitals and nursing note reviewed.  Constitutional:       General: She is not in acute distress.    Appearance: She is obese.  HENT:     Head: Normocephalic and atraumatic.  Eyes:     Extraocular Movements: Extraocular movements intact.     Pupils: Pupils are equal, round, and reactive to light.  Cardiovascular:     Rate and Rhythm: Normal rate and regular rhythm.  Pulmonary:     Effort: Pulmonary effort is normal.     Breath sounds: Normal breath sounds.  Abdominal:     Palpations: Abdomen is soft.     Tenderness: There is no abdominal tenderness.  Musculoskeletal:     Right lower leg: No edema.     Left lower leg: No edema.  Skin:    General: Skin is warm and dry.     Capillary Refill: Capillary refill takes less than 2 seconds.  Neurological:     Comments: Exam limited by patient's somnolence, but will withdraw all extremities to pain.  No apparent facial droop.  Psychiatric:     Comments: Somnolent.     ED Results / Procedures / Treatments   Labs (all labs ordered are listed, but only abnormal results are displayed) Labs Reviewed  COMPREHENSIVE METABOLIC PANEL - Abnormal; Notable for the following components:      Result Value   CO2 21 (*)    Glucose, Bld 143 (*)    ALT 70 (*)    All other components  within normal limits  SALICYLATE LEVEL - Abnormal; Notable for the following components:   Salicylate Lvl <7.0 (*)    All other components within normal limits  ACETAMINOPHEN  LEVEL - Abnormal; Notable for the following components:   Acetaminophen  (Tylenol ), Serum <10 (*)    All other components within normal limits  RAPID URINE DRUG SCREEN, HOSP PERFORMED - Abnormal; Notable for the following components:   Opiates POSITIVE (*)    Tetrahydrocannabinol POSITIVE (*)    All other components within normal limits  CBC WITH DIFFERENTIAL/PLATELET - Abnormal; Notable for the following components:   WBC 11.5 (*)    Neutro Abs 8.5 (*)    All other components within normal limits  LITHIUM  LEVEL - Abnormal; Notable for the following  components:   Lithium  Lvl <0.06 (*)    All other components within normal limits  AMMONIA - Abnormal; Notable for the following components:   Ammonia 66 (*)    All other components within normal limits  CBG MONITORING, ED - Abnormal; Notable for the following components:   Glucose-Capillary 150 (*)    All other components within normal limits  I-STAT VENOUS BLOOD GAS, ED - Abnormal; Notable for the following components:   pCO2, Ven 41.3 (*)    Calcium , Ion 1.14 (*)    All other components within normal limits  ETHANOL  HCG, SERUM, QUALITATIVE  URINALYSIS, ROUTINE W REFLEX MICROSCOPIC  TSH  HEMOGLOBIN A1C    EKG EKG Interpretation Date/Time:  Tuesday July 11 2023 10:19:46 EST Ventricular Rate:  64 PR Interval:  165 QRS Duration:  93 QT Interval:  406 QTC Calculation: 419 R Axis:   67  Text Interpretation: Sinus arrhythmia Confirmed by Neysa Clap [phone]) on 07/11/2023 12:24:31 PM  Radiology CT Head Wo Contrast Result Date: 07/11/2023 CLINICAL DATA:  53 year old female with altered mental status. EXAM: CT HEAD WITHOUT CONTRAST TECHNIQUE: Contiguous axial images were obtained from the base of the skull through the vertex without intravenous contrast. RADIATION DOSE REDUCTION: This exam was performed according to the departmental dose-optimization program which includes automated exposure control, adjustment of the mA and/or kV according to patient size and/or use of iterative reconstruction technique. COMPARISON:  Head CT 10/05/2022. FINDINGS: Brain: Normal cerebral volume. No midline shift, ventriculomegaly, mass effect, evidence of mass lesion, intracranial hemorrhage or evidence of cortically based acute infarction. Gray-white matter differentiation is within normal limits throughout the brain. Vascular: No suspicious intracranial vascular hyperdensity. Skull: Stable chronic left orbital floor fracture. No acute osseous abnormality identified. Sinuses/Orbits: Visualized paranasal  sinuses and mastoids are stable and well aerated. Other: No acute orbit or scalp soft tissue finding. IMPRESSION: 1. Stable and normal noncontrast CT appearance of the brain. 2. Chronic left orbital floor fracture. Electronically Signed   By: VEAR Hurst M.D.   On: 07/11/2023 11:26   DG Chest Portable 1 View Result Date: 07/11/2023 CLINICAL DATA:  Shortness of breath. EXAM: PORTABLE CHEST 1 VIEW COMPARISON:  10/05/2022. FINDINGS: Low lung volume. Bilateral lung fields are clear. Bilateral costophrenic angles are clear. Normal cardio-mediastinal silhouette. No acute osseous abnormalities. The soft tissues are within normal limits. IMPRESSION: *No active disease. Electronically Signed   By: Ree Molt M.D.   On: 07/11/2023 09:53    Procedures Procedures    Medications Ordered in ED Medications  enoxaparin  (LOVENOX ) injection 40 mg (has no administration in time range)  albuterol  (PROVENTIL ) (2.5 MG/3ML) 0.083% nebulizer solution 3 mL (has no administration in time range)  naloxone  (NARCAN ) nasal spray 4 mg/0.1  mL (1 spray Nasal Provided for home use 07/11/23 0513)  sodium chloride  0.9 % bolus 1,000 mL (0 mLs Intravenous Stopped 07/11/23 1258)    ED Course/ Medical Decision Making/ A&P Clinical Course as of 07/11/23 1611  Tue Jul 11, 2023  0909 Patient quite somnolent, woke tell me her name, that she was in the hospital and date.  Difficult to understand that she is not speaking loudly and mumbling, but states she took too much medicine that somebody gave her.  She is unsure what it is. [TY]  0934 I-Stat venous blood gas, ([facility] ED, [facility], [facility])(!) No CO2 retention, pH not suggestive of DKA [TY]  0935 Spoke with patient's daughter, Annabella, she noted that mother has been seemingly in a manic episode the past few days with very rapid speech and has not slept.  She spoke with her last night around midnight and she is noted pressured speech then, but no altered mentation at that time.  Daughter notes that  mother has voiced some suicidal ideation over the past few days as well.  She denied knowledge of any alcohol or substance abuse.  She reported that mother was going to the hospital at midnight and called her several times walking around outside the hospital. [TY]  1103 Opiates(!): POSITIVE [TY]  1104 Tetrahydrocannabinol(!): POSITIVE [TY]    Clinical Course User Index [TY] Neysa Caron PARAS, DO                                 Medical Decision Making 53 year old female presenting for altered mental status.  Per chart review history of bipolar on lithium .  She is afebrile nontachycardic and hemodynamically stable.  Is quite somnolent, but does not appear to have localizing deficits.  Discussed with daughter on phone.  See ED course.  Workup as noted in ED course.  Patient remains quite somnolent.  Unclear if this is secondary to her overdose or elevation in her ammonia.  Will plan for admission.  Amount and/or Complexity of Data Reviewed Labs: ordered. Decision-making details documented in ED Course. Radiology: ordered. ECG/medicine tests: ordered.  Risk Decision regarding hospitalization.         Final Clinical Impression(s) / ED Diagnoses Final diagnoses:  None    Rx / DC Orders ED Discharge Orders     None         Neysa Caron PARAS, DO 07/11/23 1611

## 2023-07-11 NOTE — ED Notes (Signed)
Pt given narcan. RN at bedside for several minutes. Pt following commands to verbal cues like "open your eyes" but not any more responsive and alert.
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
## 2023-07-11 NOTE — ED Provider Triage Note (Signed)
 Emergency Medicine Provider Triage Evaluation Note  Kelly Carr , a 53 y.o. female  was evaluated in triage.  Pt complains of who presents for somnolence.  Patient apparently had some synthetic marijuana this evening.  She has been extremely sleepy since that time.  Review of Systems  Positive: Ingestion Negative: Suicidal  Physical Exam  BP (!) 124/94 (BP Location: Right Arm)   Pulse 100   Temp 97.7 F (36.5 C)   Resp 16   SpO2 91%  Gen:   Awake, no distress   Resp:  Normal effort MSK:   Moves extremities without difficulty  Other:    Medical Decision Making  Medically screening exam initiated at 4:59 AM.  Appropriate orders placed.  Kelly Carr was informed that the remainder of the evaluation will be completed by another provider, this initial triage assessment does not replace that evaluation, and the importance of remaining in the ED until their evaluation is complete.     Arloa Chroman, PA-C 07/11/23 [phone]

## 2023-07-11 NOTE — Discharge Instructions (Addendum)
 From a psychiatric perspective - please go to clinic [address] If it is not an emergency you will get quicker care for the walk-in daytime hours (at night it functions mostly like an emergency room). You can stop taking the pills Dr. Marlee gave you - restart these around 3/5 if you are unable to find a clinic to get the Maintenna injection. Please stay away from all weed products!                                      Outpatient Substance Abuse  Treatment- uninsured  Narcotics Anonymous 24-HOUR HELPLINE Pre-recorded for Meeting Schedules [facility] [phone]  [email or web address] AL[facility]HOLICS ANONYMOUS  [place]   Answering Service [phone] Please Note: All [place] Meetings are Non-smoking findspice.es  Al[facility] Drug Services -  Insurance: Medicaid /State funding/private insurance Methadone, suboxone/Intensive outpatient  [place]   [phone] Fax: [phone] [address] [place], [place], [address] [place] [phone] Fax: [phone]    [address], [place], [place], [address] ([address] [place], [place], [place], [place], [place], [place], [place], [place]) [facility] [email or web address] Accepts State funding/Medicaid Transitional housing, Intensive Outpatient Treatment, Outpatient treatment, Veterans Services  Phone: [phone] Fax: [phone] Address: [address] [place] [address]   [employer] [facility] (http://[email or web address].[email or web address]/) Insurance: Medicaid Case Management, Administrator, Arts, Medication Management, Outpatient Therapy, Psychosocial Rehabilitation, Substance Abuse Intensive Outpatient  Phone: [phone] Fax: [phone] [address], [place], [place], [address]  [facility] Medicaid, most private insurance providers Types of Program: Individual/Group Therapy, Substance Abuse Treatment  Phone: [place] [phone] Fax: [phone] [address], [place], [place], [address] [place] [phone] [address] [place], [place], [address]  [facility]  Medicaid Types of Program: SAIOP  Phone: [phone] Fax: [phone] [address] [place], [place], [place], [address] [facility] Medicaid/state funds Crisis line [phone] [place] [phone] [place] [phone] [place] [phone]  [facility] [address];  [place], [place] [address] [phone]  Substance Abuse Intensive Outpatient Program [facility] [address] [place], [place] [address] [phone]- Substance abuse treatment  Successful Transitions  Insurance: [facility], [address], sliding scale Types of Program: substance abuse treatment, transportation assistance Phone: [phone] Fax: [phone] Address: [address], [place] [address] [facility] (trendswap.[email or web address]) Insurance: UHC, [place], [place], [place] Program: addiction counseling, detoxification,  Phone: [phone]  Fax: [phone] Address: [address], [place] [address]  [facility] (statewide facilities/programs) [address] (Medicaid/state funds) [place], [place] [address]                      Http://[email or web address]/ [phone] Kelly Carr- [phone] [place]- [phone] [facility] (2 Locations) (Medicaid/state funds) --[address]  walk in 8:30-12 and 1-2:30 [place], [id number]   [facility]: Lambert Rhonda W- [phone] --[address] [place], [place] [address]  [phone] walk in 8:30-12 and 2-3:30  [facility] state funds/medicaid [address] [place], [place] [address] [phone] [facility] (Suboxone clinic) Medicaid/state funds  [address]  [place], [place] [address] [phone]   [facility]  [address], [place], [place] [address]   [phone] (  24 hours) [place]- [address] [place], [place] [address]  [phone] (24 hours) [place]- [address] Kelly [phone] [place]- [address] [place] [phone] [place] [address] [place] [phone] [facility]- Medicaid and state funds  [place]- [address] [place], [place] [address] [phone] (24 hours) [place]- [address] [place], [place] [address] [phone] [facility]- [address] [place], [place] [address] [phone] (24 hours) [place] [address] [place], [place]  [address] [phone] [place]- [address] [place] [phone]    [facility] [address]nd Floor [place], [place] [address] [phone] Walk-in Appointment Hours Regular Hours: Mondays through Fridays 8:30 am to 4:30 pm  [facility] Emergency [facility] [phone]

## 2023-07-12 ENCOUNTER — Encounter (HOSPITAL_COMMUNITY): Payer: Self-pay | Admitting: Family Medicine

## 2023-07-12 ENCOUNTER — Other Ambulatory Visit (HOSPITAL_COMMUNITY): Payer: Self-pay

## 2023-07-12 DIAGNOSIS — F310 Bipolar disorder, current episode hypomanic: Secondary | ICD-10-CM | POA: Diagnosis not present

## 2023-07-12 DIAGNOSIS — R400 Somnolence: Secondary | ICD-10-CM | POA: Diagnosis not present

## 2023-07-12 MED ORDER — ALBUTEROL SULFATE HFA 108 (90 BASE) MCG/ACT IN AERS
1.0000 | INHALATION_SPRAY | Freq: Four times a day (QID) | RESPIRATORY_TRACT | 2 refills | Status: DC | PRN
  Filled 2023-07-12: qty 18, 25d supply, fill #0

## 2023-07-12 MED ORDER — NICOTINE 14 MG/24HR TD PT24
14.0000 mg | MEDICATED_PATCH | Freq: Every day | TRANSDERMAL | Status: DC
  Administered 2023-07-12: 14 mg via TRANSDERMAL
  Filled 2023-07-12: qty 1

## 2023-07-12 MED ORDER — ACETAMINOPHEN 325 MG PO TABS
650.0000 mg | ORAL_TABLET | Freq: Four times a day (QID) | ORAL | Status: DC | PRN
  Administered 2023-07-12: 650 mg via ORAL
  Filled 2023-07-12: qty 2

## 2023-07-12 MED ORDER — HYDROXYZINE HCL 25 MG PO TABS
25.0000 mg | ORAL_TABLET | Freq: Three times a day (TID) | ORAL | 0 refills | Status: DC | PRN
  Filled 2023-07-12: qty 30, 10d supply, fill #0

## 2023-07-12 MED ORDER — HYDROXYZINE HCL 25 MG PO TABS: 25.0000 mg | ORAL_TABLET | Freq: Three times a day (TID) | ORAL | Status: DC | PRN

## 2023-07-12 MED ORDER — ARIPIPRAZOLE ER 400 MG IM SRER
400.0000 mg | Freq: Once | INTRAMUSCULAR | Status: AC
Start: 2023-07-12 — End: 2023-07-12
  Administered 2023-07-12: 400 mg via INTRAMUSCULAR
  Filled 2023-07-12: qty 2

## 2023-07-12 MED ORDER — NICOTINE 14 MG/24HR TD PT24
14.0000 mg | MEDICATED_PATCH | Freq: Every day | TRANSDERMAL | 0 refills | Status: DC
  Filled 2023-07-12: qty 28, 28d supply, fill #0

## 2023-07-12 NOTE — Progress Notes (Signed)
 PT Cancellation Note  Patient Details Name: Kelly Carr MRN: [id number] DOB: 1971-04-09   Cancelled Treatment:    Reason Eval/Treat Not Completed: PT screened, no needs identified, will sign off (Pt at baseline and ambulating on unit without assist and without LOB.)   Stephane JULIANNA Bevel 07/12/2023, 9:34 AM Silena Wyss M,PT Acute Rehab Services [phone]
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
## 2023-07-12 NOTE — Progress Notes (Signed)
 OT Cancellation Note  Patient Details Name: Kelly Carr MRN: [id number] DOB: 18-May-1971   Cancelled Treatment:    Reason Eval/Treat Not Completed: OT screened, no needs identified, will sign off (Pt has returned to baseline. No OT needs.)  Kennth Mliss Helling 07/12/2023, 9:34 AM Mliss HERO, OTR/L Acute Rehabilitation Services Office: [phone]

## 2023-07-12 NOTE — Assessment & Plan Note (Deleted)
 Given uncertainty of situation we will keep her differential broad at this time.  Further testing and interventions as below. -Admit to MTS, Dr. Delores attending - MedSurg - Will obtain Hgb A1c, TSH and urinalysis to rule out reversible causes of AMS -Repeat BMP in a.m. -CIWA and COWS assessments -Vitals per floor -Consult to psychiatry -PT OT to eval and treat

## 2023-07-12 NOTE — ED Notes (Signed)
 SW at bedside.
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
## 2023-07-17 ENCOUNTER — Ambulatory Visit (INDEPENDENT_AMBULATORY_CARE_PROVIDER_SITE_OTHER): Payer: MEDICAID | Admitting: Student

## 2023-07-17 VITALS — BP 117/91 | HR 98 | Ht 64.0 in | Wt 169.2 lb

## 2023-07-17 DIAGNOSIS — K449 Diaphragmatic hernia without obstruction or gangrene: Secondary | ICD-10-CM | POA: Diagnosis not present

## 2023-07-17 DIAGNOSIS — F319 Bipolar disorder, unspecified: Secondary | ICD-10-CM | POA: Diagnosis not present

## 2023-07-17 NOTE — Patient Instructions (Addendum)
 It was great to see you! Thank you for allowing me to participate in your care!   Our plans for today:  - I am referring to GI for your symptoms - You can restart 20 mg of Abilify  on 5 March.   Take care and seek immediate care sooner if you develop any concerns.  Genora Kidd, MD
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
## 2023-07-19 ENCOUNTER — Emergency Department: Payer: MEDICAID

## 2023-07-19 ENCOUNTER — Observation Stay
Admission: EM | Admit: 2023-07-19 | Discharge: 2023-07-19 | Disposition: A | Discharge status: Home / Self Care | Payer: MEDICAID | Attending: Internal Medicine | Admitting: Internal Medicine

## 2023-07-19 ENCOUNTER — Other Ambulatory Visit: Payer: Self-pay

## 2023-07-19 DIAGNOSIS — F319 Bipolar disorder, unspecified: Secondary | ICD-10-CM

## 2023-07-19 DIAGNOSIS — J4520 Mild intermittent asthma, uncomplicated: Secondary | ICD-10-CM

## 2023-07-19 DIAGNOSIS — F308 Other manic episodes: Secondary | ICD-10-CM

## 2023-07-19 DIAGNOSIS — A419 Sepsis, unspecified organism: Secondary | ICD-10-CM

## 2023-07-19 DIAGNOSIS — R4182 Altered mental status, unspecified: Secondary | ICD-10-CM

## 2023-07-19 DIAGNOSIS — E872 Acidosis, unspecified: Secondary | ICD-10-CM

## 2023-07-19 DIAGNOSIS — F1910 Other psychoactive substance abuse, uncomplicated: Secondary | ICD-10-CM | POA: Diagnosis not present

## 2023-07-19 DIAGNOSIS — J45909 Unspecified asthma, uncomplicated: Secondary | ICD-10-CM | POA: Diagnosis not present

## 2023-07-19 DIAGNOSIS — F3111 Bipolar disorder, current episode manic without psychotic features, mild: Secondary | ICD-10-CM | POA: Diagnosis not present

## 2023-07-19 DIAGNOSIS — J662 Cannabinosis: Secondary | ICD-10-CM | POA: Diagnosis not present

## 2023-07-19 DIAGNOSIS — Z87891 Personal history of nicotine dependence: Secondary | ICD-10-CM | POA: Diagnosis not present

## 2023-07-19 DIAGNOSIS — T68XXXA Hypothermia, initial encounter: Secondary | ICD-10-CM | POA: Diagnosis present

## 2023-07-19 DIAGNOSIS — F4310 Post-traumatic stress disorder, unspecified: Secondary | ICD-10-CM

## 2023-07-19 DIAGNOSIS — Z79899 Other long term (current) drug therapy: Secondary | ICD-10-CM | POA: Insufficient documentation

## 2023-07-19 DIAGNOSIS — X31XXXA Exposure to excessive natural cold, initial encounter: Secondary | ICD-10-CM | POA: Insufficient documentation

## 2023-07-19 LAB — COMPREHENSIVE METABOLIC PANEL
ALT: 49 U/L — ABNORMAL HIGH (ref 0–44)
AST: 44 U/L — ABNORMAL HIGH (ref 15–41)
Albumin: 4.2 g/dL (ref 3.5–5.0)
Alkaline Phosphatase: 82 U/L (ref 38–126)
Anion gap: 20 — ABNORMAL HIGH (ref 5–15)
BUN: 8 mg/dL (ref 6–20)
CO2: 15 mmol/L — ABNORMAL LOW (ref 22–32)
Calcium: 9.5 mg/dL (ref 8.9–10.3)
Chloride: 107 mmol/L (ref 98–111)
Creatinine, Ser: 0.71 mg/dL (ref 0.44–1.00)
GFR, Estimated: >60 mL/min (ref >60)
Glucose, Bld: 113 mg/dL — ABNORMAL HIGH (ref 70–99)
Potassium: 4.6 mmol/L (ref 3.5–5.1)
Sodium: 142 mmol/L (ref 135–145)
Total Bilirubin: 0.8 mg/dL (ref 0.0–1.2)
Total Protein: 7.8 g/dL (ref 6.5–8.1)

## 2023-07-19 LAB — CBC
HCT: 35 % — ABNORMAL LOW (ref 36.0–46.0)
Hemoglobin: 11.4 g/dL — ABNORMAL LOW (ref 12.0–15.0)
MCH: 28.2 pg (ref 26.0–34.0)
MCHC: 32.6 g/dL (ref 30.0–36.0)
MCV: 86.6 fL (ref 80.0–100.0)
Platelets: 279 10*3/uL (ref 150–400)
RBC: 4.04 MIL/uL (ref 3.87–5.11)
RDW: 13.5 % (ref 11.5–15.5)
WBC: 11.3 10*3/uL — ABNORMAL HIGH (ref 4.0–10.5)
nRBC: 0 % (ref 0.0–0.2)

## 2023-07-19 LAB — TROPONIN I (HIGH SENSITIVITY)
Troponin I (High Sensitivity): 6 ng/L (ref <18)
Troponin I (High Sensitivity): 15 ng/L (ref <18)

## 2023-07-19 LAB — URINE DRUG SCREEN, QUALITATIVE (ARMC ONLY)
Amphetamines, Ur Screen: NOT DETECTED
Barbiturates, Ur Screen: NOT DETECTED
Benzodiazepine, Ur Scrn: NOT DETECTED
Cannabinoid 50 Ng, Ur ~~LOC~~: POSITIVE — AB
Cocaine Metabolite,Ur ~~LOC~~: NOT DETECTED
MDMA (Ecstasy)Ur Screen: NOT DETECTED
Methadone Scn, Ur: NOT DETECTED
Opiate, Ur Screen: POSITIVE — AB
Phencyclidine (PCP) Ur S: NOT DETECTED
Tricyclic, Ur Screen: NOT DETECTED

## 2023-07-19 LAB — URINALYSIS, ROUTINE W REFLEX MICROSCOPIC
Bilirubin Urine: NEGATIVE
Glucose, UA: NEGATIVE mg/dL
Hgb urine dipstick: NEGATIVE
Ketones, ur: 5 mg/dL — AB
Leukocytes,Ua: NEGATIVE
Nitrite: NEGATIVE
Protein, ur: NEGATIVE mg/dL
Specific Gravity, Urine: 1.008 (ref 1.005–1.030)
pH: 6 (ref 5.0–8.0)

## 2023-07-19 LAB — BASIC METABOLIC PANEL
Anion gap: 9 (ref 5–15)
BUN: 9 mg/dL (ref 6–20)
CO2: 23 mmol/L (ref 22–32)
Calcium: 8.4 mg/dL — ABNORMAL LOW (ref 8.9–10.3)
Chloride: 106 mmol/L (ref 98–111)
Creatinine, Ser: 0.59 mg/dL (ref 0.44–1.00)
GFR, Estimated: >60 mL/min (ref >60)
Glucose, Bld: 93 mg/dL (ref 70–99)
Potassium: 4.5 mmol/L (ref 3.5–5.1)
Sodium: 138 mmol/L (ref 135–145)

## 2023-07-19 LAB — CBC WITH DIFFERENTIAL/PLATELET
Abs Immature Granulocytes: 0.07 10*3/uL (ref 0.00–0.07)
Basophils Absolute: 0 10*3/uL (ref 0.0–0.1)
Basophils Relative: 0 %
Eosinophils Absolute: 0 10*3/uL (ref 0.0–0.5)
Eosinophils Relative: 0 %
HCT: 43.1 % (ref 36.0–46.0)
Hemoglobin: 13.6 g/dL (ref 12.0–15.0)
Immature Granulocytes: 1 %
Lymphocytes Relative: 13 %
Lymphs Abs: 2 10*3/uL (ref 0.7–4.0)
MCH: 28.6 pg (ref 26.0–34.0)
MCHC: 31.6 g/dL (ref 30.0–36.0)
MCV: 90.5 fL (ref 80.0–100.0)
Monocytes Absolute: 0.8 10*3/uL (ref 0.1–1.0)
Monocytes Relative: 6 %
Neutro Abs: 11.7 10*3/uL — ABNORMAL HIGH (ref 1.7–7.7)
Neutrophils Relative %: 80 %
Platelets: 378 10*3/uL (ref 150–400)
RBC: 4.76 MIL/uL (ref 3.87–5.11)
RDW: 13.6 % (ref 11.5–15.5)
WBC: 14.7 10*3/uL — ABNORMAL HIGH (ref 4.0–10.5)
nRBC: 0 % (ref 0.0–0.2)

## 2023-07-19 LAB — LACTIC ACID, PLASMA
Lactic Acid, Venous: 6.3 mmol/L (ref 0.5–1.9)
Lactic Acid, Venous: 1.3 mmol/L (ref 0.5–1.9)
Lactic Acid, Venous: 0.9 mmol/L (ref 0.5–1.9)

## 2023-07-19 LAB — CK: Total CK: 822 U/L — ABNORMAL HIGH (ref 38–234)

## 2023-07-19 LAB — CBG MONITORING, ED: Glucose-Capillary: 110 mg/dL — ABNORMAL HIGH (ref 70–99)

## 2023-07-19 MED ORDER — VANCOMYCIN HCL IN DEXTROSE 1-5 GM/200ML-% IV SOLN
1000.0000 mg | Freq: Once | INTRAVENOUS | Status: AC
  Administered 2023-07-19: 1000 mg via INTRAVENOUS
  Filled 2023-07-19: qty 200

## 2023-07-19 MED ORDER — HYDROXYZINE HCL 25 MG PO TABS: 25.0000 mg | ORAL_TABLET | Freq: Three times a day (TID) | ORAL | Status: DC | PRN

## 2023-07-19 MED ORDER — LACTATED RINGERS IV BOLUS (SEPSIS)
1000.0000 mL | Freq: Once | INTRAVENOUS | Status: AC
  Administered 2023-07-19: 1000 mL via INTRAVENOUS

## 2023-07-19 MED ORDER — SODIUM CHLORIDE 0.9 % IV SOLN: INTRAVENOUS | Status: DC

## 2023-07-19 MED ORDER — ENOXAPARIN SODIUM 40 MG/0.4ML IJ SOSY: 40.0000 mg | PREFILLED_SYRINGE | INTRAMUSCULAR | Status: DC

## 2023-07-19 MED ORDER — METRONIDAZOLE 500 MG/100ML IV SOLN
500.0000 mg | Freq: Once | INTRAVENOUS | Status: AC
  Administered 2023-07-19: 500 mg via INTRAVENOUS
  Filled 2023-07-19: qty 100

## 2023-07-19 MED ORDER — SODIUM CHLORIDE 0.9 % IV SOLN
2.0000 g | Freq: Once | INTRAVENOUS | Status: AC
  Administered 2023-07-19: 2 g via INTRAVENOUS
  Filled 2023-07-19: qty 12.5

## 2023-07-19 MED ORDER — ACETAMINOPHEN 325 MG RE SUPP
650.0000 mg | Freq: Four times a day (QID) | RECTAL | Status: DC | PRN
Start: 2023-07-19 — End: 2023-07-19

## 2023-07-19 MED ORDER — HYDROXYZINE HCL 25 MG PO TABS
25.0000 mg | ORAL_TABLET | Freq: Three times a day (TID) | ORAL | 0 refills | Status: DC | PRN
  Filled 2023-07-19: qty 30, 10d supply, fill #0

## 2023-07-19 MED ORDER — MAGNESIUM HYDROXIDE 400 MG/5ML PO SUSP: 30.0000 mL | Freq: Every day | ORAL | Status: DC | PRN

## 2023-07-19 MED ORDER — ALBUTEROL SULFATE (2.5 MG/3ML) 0.083% IN NEBU: 2.5000 mg | INHALATION_SOLUTION | Freq: Four times a day (QID) | RESPIRATORY_TRACT | Status: DC | PRN

## 2023-07-19 MED ORDER — LACTATED RINGERS IV BOLUS (SEPSIS)
500.0000 mL | Freq: Once | INTRAVENOUS | Status: AC
  Administered 2023-07-19: 500 mL via INTRAVENOUS

## 2023-07-19 MED ORDER — ARIPIPRAZOLE ER 400 MG IM SRER: 400.0000 mg | INTRAMUSCULAR | Status: DC

## 2023-07-19 MED ORDER — ACETAMINOPHEN 325 MG PO TABS: 650.0000 mg | ORAL_TABLET | Freq: Four times a day (QID) | ORAL | Status: DC | PRN

## 2023-07-19 MED ORDER — TRAZODONE HCL 50 MG PO TABS: 25.0000 mg | ORAL_TABLET | Freq: Every evening | ORAL | Status: DC | PRN

## 2023-07-19 MED ORDER — DEXTROSE 5 % IV SOLN
INTRAVENOUS | Status: DC
  Filled 2023-07-19: qty 1000
  Filled 2023-07-19: qty 150

## 2023-07-19 MED ORDER — ARIPIPRAZOLE 20 MG PO TABS
20.0000 mg | ORAL_TABLET | Freq: Every day | ORAL | 0 refills | Status: DC
  Filled 2023-07-19: qty 30, 30d supply, fill #0

## 2023-07-19 MED ORDER — HYDROXYZINE PAMOATE 25 MG PO CAPS
25.0000 mg | ORAL_CAPSULE | Freq: Three times a day (TID) | ORAL | 0 refills | Status: DC | PRN
  Filled 2023-07-19: qty 30, 10d supply, fill #0

## 2023-07-19 NOTE — Assessment & Plan Note (Signed)
-   She has associated metabolic acidosis and lactic acid of 6.3. - She has no current infectious etiology. - I will hold off on further antibiotics at this time. - We will continue aggressive hydration and rewarming while following the lactic acid level and her CO2. - We will place on IV sodium bicarbonate drip.
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
## 2023-07-19 NOTE — ED Notes (Signed)
Fall risk bracelet applied, bed alarm on, CB in reach.
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
## 2023-07-19 NOTE — ED Notes (Signed)
Foley removed by NT>

## 2023-07-19 NOTE — Assessment & Plan Note (Signed)
-   This includes marijuana and opiates. - This could have been contributing to altered mental status. - She could also be in the manic phase with her bipolar disorder with psychotic features. - A psychiatry consultation will be obtained. - This was called by the ED physician.

## 2023-07-19 NOTE — ED Notes (Signed)
Patient transported to CT

## 2023-07-19 NOTE — Assessment & Plan Note (Signed)
-  We will continue as needed albuterol.

## 2023-07-19 NOTE — Consult Note (Signed)
CODE SEPSIS - PHARMACY COMMUNICATION  **Broad Spectrum Antibiotics should be administered within 1 hour of Sepsis diagnosis**  Time Code Sepsis Called/Page Received: 0254  Antibiotics Ordered: cefepime, flagyl, vancomycin  Time of 1st antibiotic administration: 0314  Additional action taken by pharmacy: na  If necessary, Name of Provider/Nurse Contacted: na    Bettey Costa ,PharmD Clinical Pharmacist  07/19/2023  3:07 AM

## 2023-07-19 NOTE — Sepsis Progress Note (Signed)
Elink monitoring for the code sepsis protocol.

## 2023-07-19 NOTE — ED Notes (Signed)
This RN started NS infusion from prev. Shift.
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
## 2023-07-19 NOTE — ED Notes (Signed)
CCMD called and Pt placed on Cardiac monitoring.
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
## 2023-07-19 NOTE — Assessment & Plan Note (Signed)
-   The patient gets IM Abilify ER every 28 days. - Compliance is questionable at this time. - Psychiatry consult will be obtained as mentioned above.
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
## 2023-07-19 NOTE — Assessment & Plan Note (Signed)
-   This is secondary to exposure to cold close to freezing temperature with rain for 6 hours. - This could be developing frostbite. - She will be admitted to a medical telemetry observation bed. - We will continue Bair hugger. - Will monitor temperature and utilize warm IV fluids as needed.
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
## 2023-07-19 NOTE — ED Notes (Signed)
Attending messaged about removing foley as Pt is more alert and oriented. Attending wanted to see Pt first and then messaged this RN that it was OK to remove foley.
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
## 2023-07-20 ENCOUNTER — Ambulatory Visit: Payer: MEDICAID

## 2023-07-24 LAB — CULTURE, BLOOD (ROUTINE X 2)
Culture: NO GROWTH
Culture: NO GROWTH
Special Requests: ADEQUATE

## 2023-07-25 ENCOUNTER — Ambulatory Visit: Payer: MEDICAID

## 2023-07-25 NOTE — Progress Notes (Deleted)
    SUBJECTIVE:   CHIEF COMPLAINT / HPI:   ***  PERTINENT  PMH / PSH: Bipolar 1  OBJECTIVE:   There were no vitals taken for this visit. ***  General: NAD, pleasant, able to participate in exam Cardiac: RRR, no murmurs. Respiratory: CTAB, normal effort, No wheezes, rales or rhonchi Abdomen: Bowel sounds present, nontender, nondistended, no hepatosplenomegaly. Extremities: no edema or cyanosis. Skin: warm and dry, no rashes noted Neuro: alert, no obvious focal deficits Psych: Normal affect and mood  ASSESSMENT/PLAN:   No problem-specific Assessment & Plan notes found for this encounter.   Previously received long-acting Abilify injection.  Will need next injection versus oral Abilify on March 5.***  Dr. Erick Alley, DO [facility]    {    This will disappear when note is signed, click to select method of visit    :1},c

## 2023-07-27 ENCOUNTER — Emergency Department
Admission: EM | Admit: 2023-07-27 | Discharge: 2023-07-27 | Discharge status: Against Medical Advice | Payer: MEDICAID | Attending: Emergency Medicine | Admitting: Emergency Medicine

## 2023-07-27 ENCOUNTER — Emergency Department: Payer: MEDICAID

## 2023-07-27 ENCOUNTER — Encounter: Payer: Self-pay | Admitting: Emergency Medicine

## 2023-07-27 ENCOUNTER — Other Ambulatory Visit: Payer: Self-pay

## 2023-07-27 DIAGNOSIS — M25562 Pain in left knee: Secondary | ICD-10-CM | POA: Insufficient documentation

## 2023-07-27 DIAGNOSIS — Z5321 Procedure and treatment not carried out due to patient leaving prior to being seen by health care provider: Secondary | ICD-10-CM | POA: Diagnosis not present

## 2023-07-27 DIAGNOSIS — M25531 Pain in right wrist: Secondary | ICD-10-CM | POA: Diagnosis not present

## 2023-07-27 DIAGNOSIS — M79671 Pain in right foot: Secondary | ICD-10-CM | POA: Diagnosis not present

## 2023-07-27 NOTE — ED Notes (Signed)
Pt moaning loudly while walking around lobby and outside. Given warm blankets as requested.
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
## 2023-07-27 NOTE — ED Notes (Signed)
Pt up to desk and states is leaving. Ambulatory, NAD noted

## 2023-07-27 NOTE — ED Notes (Signed)
Pt up to desk, asking for wait time, states if its going to take much longer will go to PCP

## 2023-07-28 ENCOUNTER — Encounter: Payer: Self-pay | Admitting: Physician Assistant

## 2023-07-28 ENCOUNTER — Ambulatory Visit: Payer: MEDICAID | Admitting: Physician Assistant

## 2023-07-28 DIAGNOSIS — M79671 Pain in right foot: Secondary | ICD-10-CM | POA: Diagnosis not present

## 2023-07-28 MED ORDER — DICLOFENAC SODIUM 1 % EX GEL: 2.0000 g | Freq: Four times a day (QID) | CUTANEOUS | 0 refills | Status: DC | PRN
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
## 2023-07-29 ENCOUNTER — Other Ambulatory Visit: Payer: Self-pay

## 2023-07-29 ENCOUNTER — Emergency Department
Admission: EM | Admit: 2023-07-29 | Discharge: 2023-07-29 | Disposition: A | Discharge status: Other Acute Inpatient Hospital | Payer: MEDICAID | Attending: Emergency Medicine | Admitting: Emergency Medicine

## 2023-07-29 ENCOUNTER — Inpatient Hospital Stay
Admission: AD | Admit: 2023-07-29 | Discharge: 2023-08-08 | DRG: 885 | Disposition: A | Discharge status: Home / Self Care | Payer: MEDICAID | Source: Intra-hospital | Attending: Psychiatry | Admitting: Psychiatry

## 2023-07-29 DIAGNOSIS — Z818 Family history of other mental and behavioral disorders: Secondary | ICD-10-CM | POA: Diagnosis not present

## 2023-07-29 DIAGNOSIS — F17290 Nicotine dependence, other tobacco product, uncomplicated: Secondary | ICD-10-CM | POA: Diagnosis present

## 2023-07-29 DIAGNOSIS — Z79899 Other long term (current) drug therapy: Secondary | ICD-10-CM | POA: Diagnosis not present

## 2023-07-29 DIAGNOSIS — F23 Brief psychotic disorder: Secondary | ICD-10-CM

## 2023-07-29 DIAGNOSIS — S9031XA Contusion of right foot, initial encounter: Secondary | ICD-10-CM | POA: Diagnosis present

## 2023-07-29 DIAGNOSIS — Z5982 Transportation insecurity: Secondary | ICD-10-CM | POA: Diagnosis not present

## 2023-07-29 DIAGNOSIS — F29 Unspecified psychosis not due to a substance or known physiological condition: Secondary | ICD-10-CM | POA: Diagnosis not present

## 2023-07-29 DIAGNOSIS — J45909 Unspecified asthma, uncomplicated: Secondary | ICD-10-CM | POA: Insufficient documentation

## 2023-07-29 DIAGNOSIS — K5900 Constipation, unspecified: Secondary | ICD-10-CM | POA: Diagnosis not present

## 2023-07-29 DIAGNOSIS — F4310 Post-traumatic stress disorder, unspecified: Secondary | ICD-10-CM | POA: Diagnosis present

## 2023-07-29 DIAGNOSIS — Z888 Allergy status to other drugs, medicaments and biological substances status: Secondary | ICD-10-CM

## 2023-07-29 DIAGNOSIS — Z833 Family history of diabetes mellitus: Secondary | ICD-10-CM | POA: Diagnosis not present

## 2023-07-29 DIAGNOSIS — F419 Anxiety disorder, unspecified: Secondary | ICD-10-CM | POA: Diagnosis present

## 2023-07-29 DIAGNOSIS — X58XXXA Exposure to other specified factors, initial encounter: Secondary | ICD-10-CM | POA: Diagnosis present

## 2023-07-29 DIAGNOSIS — M25562 Pain in left knee: Secondary | ICD-10-CM | POA: Diagnosis present

## 2023-07-29 DIAGNOSIS — M1990 Unspecified osteoarthritis, unspecified site: Secondary | ICD-10-CM | POA: Diagnosis present

## 2023-07-29 DIAGNOSIS — F312 Bipolar disorder, current episode manic severe with psychotic features: Principal | ICD-10-CM | POA: Diagnosis present

## 2023-07-29 DIAGNOSIS — F3162 Bipolar disorder, current episode mixed, moderate: Secondary | ICD-10-CM

## 2023-07-29 DIAGNOSIS — Z881 Allergy status to other antibiotic agents status: Secondary | ICD-10-CM

## 2023-07-29 DIAGNOSIS — K649 Unspecified hemorrhoids: Secondary | ICD-10-CM | POA: Diagnosis present

## 2023-07-29 DIAGNOSIS — F1210 Cannabis abuse, uncomplicated: Secondary | ICD-10-CM | POA: Diagnosis present

## 2023-07-29 DIAGNOSIS — Z885 Allergy status to narcotic agent status: Secondary | ICD-10-CM

## 2023-07-29 DIAGNOSIS — R197 Diarrhea, unspecified: Secondary | ICD-10-CM | POA: Diagnosis not present

## 2023-07-29 DIAGNOSIS — F319 Bipolar disorder, unspecified: Principal | ICD-10-CM | POA: Diagnosis present

## 2023-07-29 DIAGNOSIS — Z8249 Family history of ischemic heart disease and other diseases of the circulatory system: Secondary | ICD-10-CM

## 2023-07-29 DIAGNOSIS — S86912A Strain of unspecified muscle(s) and tendon(s) at lower leg level, left leg, initial encounter: Secondary | ICD-10-CM | POA: Diagnosis present

## 2023-07-29 DIAGNOSIS — R456 Violent behavior: Secondary | ICD-10-CM | POA: Diagnosis present

## 2023-07-29 LAB — URINE DRUG SCREEN, QUALITATIVE (ARMC ONLY)
Amphetamines, Ur Screen: NOT DETECTED
Barbiturates, Ur Screen: NOT DETECTED
Benzodiazepine, Ur Scrn: POSITIVE — AB
Cannabinoid 50 Ng, Ur ~~LOC~~: POSITIVE — AB
Cocaine Metabolite,Ur ~~LOC~~: NOT DETECTED
MDMA (Ecstasy)Ur Screen: NOT DETECTED
Methadone Scn, Ur: NOT DETECTED
Opiate, Ur Screen: NOT DETECTED
Phencyclidine (PCP) Ur S: NOT DETECTED
Tricyclic, Ur Screen: POSITIVE — AB

## 2023-07-29 LAB — COMPREHENSIVE METABOLIC PANEL
ALT: 54 U/L — ABNORMAL HIGH (ref 0–44)
AST: 41 U/L (ref 15–41)
Albumin: 3.8 g/dL (ref 3.5–5.0)
Alkaline Phosphatase: 76 U/L (ref 38–126)
Anion gap: 13 (ref 5–15)
BUN: 11 mg/dL (ref 6–20)
CO2: 21 mmol/L — ABNORMAL LOW (ref 22–32)
Calcium: 8.7 mg/dL — ABNORMAL LOW (ref 8.9–10.3)
Chloride: 103 mmol/L (ref 98–111)
Creatinine, Ser: 0.57 mg/dL (ref 0.44–1.00)
GFR, Estimated: >60 mL/min (ref >60)
Glucose, Bld: 112 mg/dL — ABNORMAL HIGH (ref 70–99)
Potassium: 3.5 mmol/L (ref 3.5–5.1)
Sodium: 137 mmol/L (ref 135–145)
Total Bilirubin: 0.8 mg/dL (ref 0.0–1.2)
Total Protein: 6.8 g/dL (ref 6.5–8.1)

## 2023-07-29 LAB — CBC
HCT: 36.5 % (ref 36.0–46.0)
Hemoglobin: 11.8 g/dL — ABNORMAL LOW (ref 12.0–15.0)
MCH: 28.4 pg (ref 26.0–34.0)
MCHC: 32.3 g/dL (ref 30.0–36.0)
MCV: 88 fL (ref 80.0–100.0)
Platelets: 327 10*3/uL (ref 150–400)
RBC: 4.15 MIL/uL (ref 3.87–5.11)
RDW: 13.6 % (ref 11.5–15.5)
WBC: 6.7 10*3/uL (ref 4.0–10.5)
nRBC: 0 % (ref 0.0–0.2)

## 2023-07-29 LAB — SALICYLATE LEVEL: Salicylate Lvl: <7 mg/dL — ABNORMAL LOW (ref 7.0–30.0)

## 2023-07-29 LAB — ACETAMINOPHEN LEVEL: Acetaminophen (Tylenol), Serum: <10 ug/mL — ABNORMAL LOW (ref 10–30)

## 2023-07-29 LAB — ETHANOL: Alcohol, Ethyl (B): <10 mg/dL (ref <10)

## 2023-07-29 LAB — LITHIUM LEVEL: Lithium Lvl: <0.06 mmol/L — ABNORMAL LOW (ref 0.60–1.20)

## 2023-07-29 MED ORDER — ALUM & MAG HYDROXIDE-SIMETH 200-200-20 MG/5ML PO SUSP
15.0000 mL | ORAL | Status: DC | PRN
  Administered 2023-07-31 – 2023-08-01 (×2): 15 mL via ORAL
  Filled 2023-07-29 (×2): qty 30

## 2023-07-29 MED ORDER — ACETAMINOPHEN 325 MG PO TABS
650.0000 mg | ORAL_TABLET | Freq: Once | ORAL | Status: AC
  Administered 2023-07-29: 650 mg via ORAL
  Filled 2023-07-29: qty 2

## 2023-07-29 MED ORDER — HYDROXYZINE HCL 25 MG PO TABS
25.0000 mg | ORAL_TABLET | Freq: Three times a day (TID) | ORAL | Status: DC | PRN
  Administered 2023-07-29: 25 mg via ORAL
  Filled 2023-07-29: qty 1

## 2023-07-29 MED ORDER — OLANZAPINE 10 MG IM SOLR: 5.0000 mg | Freq: Three times a day (TID) | INTRAMUSCULAR | Status: DC | PRN

## 2023-07-29 MED ORDER — ACETAMINOPHEN 325 MG PO TABS
650.0000 mg | ORAL_TABLET | Freq: Four times a day (QID) | ORAL | Status: DC | PRN
  Administered 2023-07-29 – 2023-08-07 (×15): 650 mg via ORAL
  Filled 2023-07-29 (×15): qty 2

## 2023-07-29 MED ORDER — ONDANSETRON 4 MG PO TBDP
4.0000 mg | ORAL_TABLET | Freq: Two times a day (BID) | ORAL | Status: DC
  Administered 2023-07-30 – 2023-08-05 (×14): 4 mg via ORAL
  Filled 2023-07-29 (×17): qty 1

## 2023-07-29 MED ORDER — LORAZEPAM 2 MG PO TABS
2.0000 mg | ORAL_TABLET | Freq: Once | ORAL | Status: AC
  Administered 2023-07-29: 2 mg via ORAL
  Filled 2023-07-29: qty 1

## 2023-07-29 MED ORDER — TRAZODONE HCL 50 MG PO TABS
50.0000 mg | ORAL_TABLET | Freq: Every day | ORAL | Status: DC
  Administered 2023-07-30 – 2023-08-07 (×10): 50 mg via ORAL
  Filled 2023-07-29 (×10): qty 1

## 2023-07-29 MED ORDER — MAGNESIUM HYDROXIDE 400 MG/5ML PO SUSP
15.0000 mL | Freq: Every day | ORAL | Status: DC | PRN
  Administered 2023-08-04 (×2): 15 mL via ORAL
  Filled 2023-07-29 (×2): qty 30

## 2023-07-29 MED ORDER — OLANZAPINE 10 MG IM SOLR: 10.0000 mg | Freq: Three times a day (TID) | INTRAMUSCULAR | Status: DC | PRN

## 2023-07-29 MED ORDER — OLANZAPINE 5 MG PO TBDP
5.0000 mg | ORAL_TABLET | Freq: Three times a day (TID) | ORAL | Status: DC | PRN
  Administered 2023-07-29 – 2023-08-05 (×3): 5 mg via ORAL
  Filled 2023-07-29 (×3): qty 1

## 2023-07-29 NOTE — ED Notes (Signed)
 Pt continues to sleep for the majority of this shift. PO PRN was administered prior shift, Pt has woken up and used the restroom and allowed staff to obtain vitals.  Pt did share "I'm so tired, I haven't slept good in weeks"  Staff allowed pt to continue resting
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
## 2023-07-29 NOTE — BH Assessment (Signed)
 TTS/Psych will not be able to see patient via telepsych because she is currently in the waiting room and IVC'd and would not be able to ensure patients privacy until she can be placed somewhere private and so that the assessment will be HIPAA compliant.

## 2023-07-29 NOTE — ED Notes (Signed)
 Breakfast tray provided for pt.
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
## 2023-07-29 NOTE — ED Notes (Signed)
 IVC/ recommend inpatient psych hospitalization when medically cleared
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
## 2023-07-29 NOTE — Consult Note (Signed)
Admission orders completed.

## 2023-07-29 NOTE — ED Notes (Signed)
 ivc/consult done/NP recommended pt for inpatient psychiatric hospitalization when medically cleared.
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
## 2023-07-29 NOTE — ED Triage Notes (Addendum)
 Pt to ed from [facility] in lobby via [facility] for ankle injury. Pt is hallucinating. [place] [facility] took her to [facility] yesterday. Per EMS she is speaking out of her head.  Vitals for EMS.  128/62 90 100% 18 [facility] EMS states " she was pulling off her socks due to bugs crawling on her".

## 2023-07-29 NOTE — ED Notes (Signed)
 Pt was woken up, vitals taken, breakfast given. Pt was calm and cooperative. Pt requested to return back to sleep, cont to monitor as ordered

## 2023-07-29 NOTE — ED Notes (Addendum)
 RN called transfer of care report to [facility] RN.  Pt Dx, Hx, Sx, current condition, labs discussed.  All questions asked were answered.

## 2023-07-29 NOTE — ED Notes (Signed)
 Hospital meal provided, pt tolerated w/o complaints.  Waste discarded appropriately.

## 2023-07-29 NOTE — Consult Note (Signed)
 [facility] Psychiatric Consult Initial  Patient Name: .Kelly Carr  MRN: [id number]  DOB: [birth year]  Consult Order details:  Orders (From admission, onward)     Start     Ordered   07/29/23 0309  IP CONSULT TO PSYCHIATRY       Ordering Provider: Irean Hong, MD  Provider:  (Not yet assigned)  Question Answer Comment  Place call to: psychiatry   Reason for Consult Consult   Diagnosis/Clinical Info for Consult: bipolar d/o, hallucinations      07/29/23 0308   07/29/23 0308  CONSULT TO CALL ACT TEAM       Ordering Provider: Irean Hong, MD  Provider:  (Not yet assigned)  Question:  Reason for Consult?  Answer:  Psych consult   07/29/23 0308   07/29/23 0248  CONSULT TO CALL ACT TEAM       Ordering Provider: Irean Hong, MD  Provider:  (Not yet assigned)  Question:  Reason for Consult?  Answer:  [facility]   07/29/23 0248             Mode of Visit: Tele-visit Virtual Statement:TELE PSYCHIATRY ATTESTATION & CONSENT As the provider for this telehealth consult, I attest that I verified the patient's identity using two separate identifiers, introduced myself to the patient, provided my credentials, disclosed my location, and performed this encounter via a HIPAA-compliant, real-time, face-to-face, two-way, interactive audio and video platform and with the full consent and agreement of the patient (or guardian as applicable.) Patient physical location: [facility] ER. Telehealth provider physical location: home office in state of [place].   Video start time:   Video end time:      Psychiatry Consult Evaluation  Service Date: July 29, 2023 LOS:  LOS: 0 days  Chief Complaint  Hallucinations and behavioral disturbance  Primary Psychiatric Diagnoses  Principal Problem:   Severe manic bipolar 1 disorder with psychotic behavior [facility])  Assessment  Kelly Carr is a 53 y.o. female admitted: Presented to the ED on 07/29/2023  3:28 AM for bizarre behavior. She carries the psychiatric diagnoses  of Severe manic bipolar 1 disorder with psychotic behavior.   Her current presentation of acute psychosis is most consistent with a primary psychiatric disorder of bipolar  She meets criteria for psychotic disorder based on disorganized behavior, hallucinations, and impaired insight/judgment.   Diagnoses:  Active Hospital problems: Principal Problem:   Severe manic bipolar 1 disorder with psychotic behavior (HCC)    Plan   ## Psychiatric Medication Recommendations:  Continue home meds once reconciled  ## Medical Decision Making Capacity: Not specifically addressed in this encounter  ## Further Work-up:  [name] was admitted to [facility] ER for Severe manic bipolar 1 disorder with psychotic behavior (HCC), crisis management, and stabilization. Routine labs ordered, which include Lab Orders         Comprehensive metabolic panel         Ethanol         Salicylate level         Acetaminophen level         cbc         Urine Drug Screen, Qualitative         POC urine preg, ED    Medication Management: Medications being evaluated and reconciled  Will maintain observation checks every 15 minutes for safety. Psychosocial education regarding relapse prevention and self-care; social and communication  Social work will consult with family for collateral information and discuss discharge and follow up  plan.   ## Disposition:-- We recommend inpatient psychiatric hospitalization when medically cleared. Patient is under voluntary admission status at this time; please IVC if attempts to leave hospital.  ## Behavioral / Environmental: - No specific recommendations at this time.     ## Safety and Observation Level:  - Based on my clinical evaluation, I estimate the patient to be at moderate risk of self harm in the current setting. - At this time, we recommend  routine. This decision is based on my review of the chart including patient's history and current presentation, interview of the  patient, mental status examination, and consideration of suicide risk including evaluating suicidal ideation, plan, intent, suicidal or self-harm behaviors, risk factors, and protective factors. This judgment is based on our ability to directly address suicide risk, implement suicide prevention strategies, and develop a safety plan while the patient is in the clinical setting. Please contact our team if there is a concern that risk level has changed.  CSSR Risk Category:C-SSRS RISK CATEGORY: No Risk  Suicide Risk Assessment: Patient has following modifiable risk factors for suicide: recklessness and medication noncompliance, which we are addressing by recommending inpatient hospitalization. Patient has following non-modifiable or demographic risk factors for suicide: psychiatric hospitalization Patient has the following protective factors against suicide: Supportive family and Frustration tolerance  Thank you for this consult request. Recommendations have been communicated to the primary team.  We will recommend inpatient at this time.   [name], NP       History of Present Illness  53 year old female patient brought to the ED via [facility] from [facility] lobby for an ankle injury. EMS reports the patient is experiencing hallucinations and exhibiting disorganized behavior. Patient was seen at [facility] yesterday per [facility]. EMS noted that the patient was attempting to remove socks due to a belief that bugs were crawling on her. Patient is reportedly "speaking out of her head."  Patient Report:  Reports that she came for medical attention and "got IVC'd".  On assessment, patient is speaking fast and making delusional statements say that she is marrying [name] in May.    Current outpatient psychotropic medications include Abilify maintain,  and historically she has had a variable response to these medications. She was partially compliant with medications prior to admission as evidenced by  medication nonadherence, and worsening symptoms.    On initial examination, patient appears disheveled, responding to internal stimuli, and exhibiting disorganized thought processes.  Per chart review, on July 07, 2023 the Doctor discussed and inevitable delay in care due to not being able to get her Shot.  Severe manic bipolar 1 disorder with psychotic behavior (HCC) Mood and affect are normal today. Very unfortunate she is going to have a gap in her long-acting injectable coverage.  I attempted to order her LAI out of our office but it appears this can only be ordered by psychiatric providers.  Therefore discussed with her transitioning to oral Abilify until she has her appointment to establish care with her new psychiatrist in March.  She is amenable to this.  Discussed with our pharmacy team that the best equivalent dose would be 20 mg/day. -Have written a prescription for Abilify 20 mg p.o. daily -She is aware of psychiatric urgent care services available at the [address] location should she need it  She received her abilify 400 mg shot in the ER on 07/19/2023.  Psych ROS:  Psychiatric: Positive for hallucinations, disorganized speech, paranoia Neurological: No reported seizures, weakness,  or focal deficits Cardiopulmonary: No chest pain, shortness of breath GI/GU: No nausea, vomiting, diarrhea Dermatological: No visible rash or infestations   Review of Systems  Psychiatric/Behavioral:  Positive for hallucinations. Negative for depression, substance abuse and suicidal ideas.   All other systems reviewed and are negative.    Psychiatric and Social History  Psychiatric History:  Information collected from patient and chart review  Prev Dx/Sx: Bipolar disorder Current Psych Provider: [facility] Home Meds (current): Abilify  Prior Psych Hospitalization: yes  Prior Self Harm: yes Prior Violence: no    Exam Findings  Physical Exam: Vital Signs:  Temp:  [98.1 F (36.7 C)-98.3 F  (36.8 C)] 98.3 F (36.8 C) (02/22 0319) Pulse Rate:  [93-95] 95 (02/22 0319) Resp:  [16-18] 16 (02/22 0319) BP: (100-117)/(67-69) 117/69 (02/22 0319) SpO2:  [97 %-100 %] 97 % (02/22 0319) Weight:  [76 kg-76.5 kg] 76.2 kg (02/22 0319) Blood pressure 117/69, pulse 95, temperature 98.3 F (36.8 C), temperature source Oral, resp. rate 16, height 5\' 4"  (1.626 m), weight 76.2 kg, SpO2 97%. Body mass index is 28.84 kg/m.  Physical Exam Vitals and nursing note reviewed.     Mental Status Exam: General Appearance: Bizarre  Orientation:  Full (Time, Place, and Person)  Memory:  Immediate;   Poor Recent;   Poor Remote;   Poor  Concentration:  Concentration: Fair and Attention Span: Poor  Recall:  Fair  Attention  Poor  Eye Contact:  Fair  Speech:  Clear and Coherent  Language:  Good  Volume:  Normal  Mood: depressed  Affect:  Labile  Thought Process:  Disorganized  Thought Content:  Delusions and Hallucinations: Auditory  Suicidal Thoughts:  No  Homicidal Thoughts:  No  Judgement:  Impaired  Insight:  Lacking  Psychomotor Activity:  Normal  Akathisia:  NA  Fund of Knowledge:  Fair      Assets:  Manufacturing systems engineer Desire for Improvement  Cognition:  Impaired,  Mild  ADL's:  Intact  AIMS (if indicated):        Other History   These have been pulled in through the EMR, reviewed, and updated if appropriate.  Family History:  The patient's family history includes Bipolar disorder in her cousin and nephew; Depression in her daughter; Diabetes in her maternal grandfather, maternal grandmother, and mother; Heart disease in her maternal grandfather and maternal grandmother; Heart disease (age of onset: 64) in her mother; Hypertension in her mother; Schizophrenia in her mother.  Medical History: Past Medical History:  Diagnosis Date   Abnormal Pap smear    Unknown results>colpo>normal   Abscess 10/14/2021   Anxiety    Arthritis    Asthma    Bipolar 1 disorder (HCC)     Cervical strain 05/04/2021   Depression    Forearm strain, left, initial encounter 05/04/2021   Labral tear of hip joint 12/27/2014   Orbital floor (blow-out) closed fracture (HCC) 10/17/2018   Orbital floor (blow-out) closed fracture (HCC) 10/17/2018   Paresthesia and pain of extremity 04/07/2021   PTSD (post-traumatic stress disorder)    Rotator cuff tendinitis, right 05/04/2021    Surgical History: Past Surgical History:  Procedure Laterality Date   NO PAST SURGERIES       Medications:  No current facility-administered medications for this encounter.  Current Outpatient Medications:    albuterol (VENTOLIN HFA) 108 (90 Base) MCG/ACT inhaler, Inhale 1-2 puffs into the lungs every 6 (six) hours as needed for wheezing or shortness of breath., Disp: 18 g, Rfl:  2   ARIPiprazole (ABILIFY) 20 MG tablet, Take 1 tablet (20 mg total) by mouth daily., Disp: 30 tablet, Rfl: 0   diclofenac Sodium (VOLTAREN) 1 % GEL, Apply 2 g topically 4 (four) times daily as needed., Disp: 150 g, Rfl: 0   hydrOXYzine (ATARAX) 25 MG tablet, Take 1 tablet (25 mg total) by mouth 3 (three) times daily as needed., Disp: 30 tablet, Rfl: 0   nicotine (NICODERM CQ - DOSED IN MG/24 HOURS) 14 mg/24hr patch, Place 1 patch (14 mg total) onto the skin daily., Disp: 28 patch, Rfl: 0  Allergies: Allergies  Allergen Reactions   Vibramycin [Doxycycline] Itching   Celebrex [Celecoxib] Other (See Comments)    Made hands numb   Depakene [Valproic Acid] Swelling   Haldol [Haloperidol Lactate] Other (See Comments)    Syncope    Medrol [Methylprednisolone] Nausea And Vomiting and Other (See Comments)    Severe acid reflux after Medrol dose pack.   Neurontin [Gabapentin] Other (See Comments)    Difficulty moving   Risperidone And Related Other (See Comments)    "Zones out"   Ultram [Tramadol] Itching    Ashantia Amaral Damaris Hippo, NP

## 2023-07-29 NOTE — ED Notes (Addendum)
 Pt conversation goes back and forth. While attempting to dress out pt, pt advised she is not here for a pscy eval she is her for medical issues. Attempted to explain to pt we will dress her out while she is being evaluated for her medical issues as to why she was brought here by EMS. PT advised she has to go to the bathroom and was in the bathroom for over 10 mins. When pt came back to triage 1 room, pt advised she can not give [place] a urine. When pt came back from the bathroom, pt refused to give her backpack and her coat to put with her personal belongings and advised she will be leaving voluntary. RN, Morrie Sheldon advised she is being IVC and will not be leaving.

## 2023-07-29 NOTE — ED Notes (Signed)
 Pt using telephone when RN arrived.  Pt endorses speaking with brother.  RN notified pt that phone hours have ended and she could have a few more minutes to speak with brother.  Pt stated that brother hung up telephone at that time and gave RN the telephone.    Pt requested to use pen to write some things.  Pt brought to day room and sat at table, RN gave pt pen.  Pt wrote a timeline of recent events containing 2 items, then returned pen.    RN explained to pt that she has been accepted downstairs and would be transferred shortly.  Pt had no other questions or concerns.

## 2023-07-29 NOTE — ED Notes (Signed)
 Pt entered hallway multiple times speaking in a raised tone of voice.  Each time RN explained to pt that she cannot be in the hallway at this time.  Pt difficult to redirect but would return to room.  Pt continued speaking while in room and began speaking in an even louder tone.  RN informed pt that because she is waking other patients the RN would have to close her door.    Pt attempted to slam door twice during these interactions but RN was able to slow the door with hand.
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
## 2023-07-29 NOTE — ED Notes (Signed)
 PT IVC/recommend inpatient psychiatric hospitalization when medically cleared

## 2023-07-30 ENCOUNTER — Encounter: Payer: Self-pay | Admitting: Psychiatric/Mental Health

## 2023-07-30 DIAGNOSIS — F312 Bipolar disorder, current episode manic severe with psychotic features: Secondary | ICD-10-CM | POA: Diagnosis not present

## 2023-07-30 MED ORDER — HYDROXYZINE HCL 50 MG PO TABS
50.0000 mg | ORAL_TABLET | Freq: Three times a day (TID) | ORAL | Status: DC | PRN
  Administered 2023-07-30 – 2023-08-01 (×2): 50 mg via ORAL
  Filled 2023-07-30 (×2): qty 1

## 2023-07-30 MED ORDER — LITHIUM CARBONATE ER 300 MG PO TBCR
300.0000 mg | EXTENDED_RELEASE_TABLET | Freq: Two times a day (BID) | ORAL | Status: DC
  Administered 2023-07-30 – 2023-08-02 (×6): 300 mg via ORAL
  Filled 2023-07-30 (×6): qty 1

## 2023-07-30 MED ORDER — NICOTINE POLACRILEX 2 MG MT GUM
2.0000 mg | CHEWING_GUM | OROMUCOSAL | Status: DC | PRN
  Administered 2023-07-30 – 2023-08-08 (×25): 2 mg via ORAL
  Filled 2023-07-30 (×31): qty 1

## 2023-07-30 MED ORDER — CLONAZEPAM 0.25 MG PO TBDP
0.2500 mg | ORAL_TABLET | Freq: Two times a day (BID) | ORAL | Status: AC
  Administered 2023-07-30 – 2023-07-31 (×2): 0.25 mg via ORAL
  Filled 2023-07-30 (×2): qty 1

## 2023-07-30 MED ORDER — CLONAZEPAM 0.125 MG PO TBDP
0.1250 mg | ORAL_TABLET | Freq: Two times a day (BID) | ORAL | Status: AC
  Administered 2023-07-31 – 2023-08-01 (×2): 0.125 mg via ORAL
  Filled 2023-07-30 (×2): qty 1

## 2023-07-30 MED ORDER — IBUPROFEN 600 MG PO TABS
800.0000 mg | ORAL_TABLET | Freq: Three times a day (TID) | ORAL | Status: DC | PRN
  Administered 2023-07-31 – 2023-08-08 (×18): 800 mg via ORAL
  Filled 2023-07-30 (×18): qty 1

## 2023-07-30 MED ORDER — MELATONIN 5 MG PO TABS: 5.0000 mg | ORAL_TABLET | Freq: Every day | ORAL | Status: DC

## 2023-07-30 MED ORDER — ARIPIPRAZOLE 10 MG PO TABS
10.0000 mg | ORAL_TABLET | Freq: Every day | ORAL | Status: DC
  Administered 2023-07-30 – 2023-08-03 (×5): 10 mg via ORAL
  Filled 2023-07-30 (×5): qty 1

## 2023-07-30 MED ORDER — HYDROCODONE-ACETAMINOPHEN 5-325 MG PO TABS
1.0000 | ORAL_TABLET | Freq: Three times a day (TID) | ORAL | Status: AC | PRN
  Administered 2023-07-30 – 2023-07-31 (×3): 1 via ORAL
  Filled 2023-07-30 (×3): qty 1

## 2023-07-30 MED ORDER — MELATONIN 5 MG PO TABS
5.0000 mg | ORAL_TABLET | Freq: Every day | ORAL | Status: DC
  Administered 2023-07-30 – 2023-08-06 (×8): 5 mg via ORAL
  Filled 2023-07-30 (×8): qty 1

## 2023-07-30 MED ORDER — ARIPIPRAZOLE ER 400 MG IM SRER
300.0000 mg | INTRAMUSCULAR | Status: DC
Start: 2024-08-08 — End: 2023-08-03

## 2023-07-30 MED ORDER — ALBUTEROL SULFATE (2.5 MG/3ML) 0.083% IN NEBU: 2.5000 mg | INHALATION_SOLUTION | Freq: Four times a day (QID) | RESPIRATORY_TRACT | Status: DC | PRN

## 2023-07-30 MED ORDER — NICOTINE 14 MG/24HR TD PT24
14.0000 mg | MEDICATED_PATCH | Freq: Every day | TRANSDERMAL | Status: DC
  Administered 2023-07-30 – 2023-08-08 (×10): 14 mg via TRANSDERMAL
  Filled 2023-07-30 (×10): qty 1

## 2023-07-30 MED ORDER — DICLOFENAC SODIUM 1 % EX GEL
2.0000 g | Freq: Four times a day (QID) | CUTANEOUS | Status: DC
  Administered 2023-07-30 – 2023-08-08 (×25): 2 g via TOPICAL
  Filled 2023-07-30 (×2): qty 100
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
## 2023-07-30 NOTE — Progress Notes (Signed)
   07/30/23 0900  Psych Admission Type (Psych Patients Only)  Admission Status Involuntary  Psychosocial Assessment  Patient Complaints Agitation ("not sure wjhy she is here")  Eye Contact Intense  Facial Expression Animated  Affect Anxious;Irritable  Speech Rapid  Interaction Attention-seeking  Motor Activity Hyperactive  Appearance/Hygiene Unremarkable  Behavior Characteristics Hyperactive  Mood Preoccupied;Pleasant  Thought Process  Coherency WDL  Content WDL  Delusions WDL  Perception WDL  Hallucination None reported or observed  Judgment Poor  Confusion Mild  Danger to Self  Current suicidal ideation? Denies  Agreement Not to Harm Self Yes  Description of Agreement verbal  Danger to Others  Danger to Others None reported or observed

## 2023-07-30 NOTE — Plan of Care (Signed)
   Problem: Education: Goal: Mental status will improve Outcome: Not Progressing

## 2023-07-30 NOTE — Group Note (Signed)
 Date:  07/30/2023 Time:  3:12 PM  Group Topic/Focus:  Goals Group:   The focus of this group is to help patients establish daily goals to achieve during treatment and discuss how the patient can incorporate goal setting into their daily lives to aide in recovery.    Participation Level:  Active  Participation Quality:  Appropriate  Affect:  Appropriate  Cognitive:  Appropriate  Insight: Appropriate  Engagement in Group:  Engaged  Modes of Intervention:  Activity  Additional Comments:    Kelly Carr 07/30/2023, 3:12 PM
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
## 2023-07-30 NOTE — [facility] Suicide Risk Assessment (Signed)
 [facility] INPATIENT:  Family/Significant Other Suicide Prevention Education  Suicide Prevention Education:  Patient Refusal for Family/Significant Other Suicide Prevention Education: The patient Kelly Carr has refused to provide written consent for family/significant other to be provided Family/Significant Other Suicide Prevention Education during admission and/or prior to discharge.  Physician notified.  Carloyn Jaeger Grossman-Orr 07/30/2023, 3:53 PM

## 2023-07-30 NOTE — Group Note (Signed)
 Date:  07/30/2023 Time:  4:40 PM  Group Topic/Focus:  Activity Group: The focus of the group is to promote activity for the patients and to encourage patients to go outside to the courtyard and get some fresh air and some exercise.    Participation Level:  Active  Participation Quality:  Appropriate  Affect:  Appropriate  Cognitive:  Appropriate  Insight: Appropriate  Engagement in Group:  Engaged  Modes of Intervention:  Activity  Additional Comments:    Mary Sella Kenny Rea 07/30/2023, 4:40 PM
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
## 2023-07-30 NOTE — Plan of Care (Signed)

## 2023-07-31 ENCOUNTER — Ambulatory Visit: Payer: MEDICAID | Admitting: Student

## 2023-07-31 ENCOUNTER — Ambulatory Visit: Payer: MEDICAID | Admitting: Obstetrics & Gynecology

## 2023-07-31 MED ORDER — HYDROCORTISONE 1 % EX CREA: TOPICAL_CREAM | Freq: Two times a day (BID) | CUTANEOUS | Status: DC | PRN

## 2023-07-31 MED ORDER — HYDROCODONE-ACETAMINOPHEN 5-325 MG PO TABS
1.0000 | ORAL_TABLET | Freq: Three times a day (TID) | ORAL | Status: AC | PRN
  Administered 2023-08-01 (×2): 1 via ORAL
  Filled 2023-07-31 (×2): qty 1

## 2023-07-31 MED ORDER — LOPERAMIDE HCL 2 MG PO CAPS
2.0000 mg | ORAL_CAPSULE | ORAL | Status: DC | PRN
  Administered 2023-07-31: 2 mg via ORAL
  Filled 2023-07-31: qty 1

## 2023-07-31 NOTE — Group Note (Signed)
 Date:  07/31/2023 Time:  11:12 AM  Group Topic/Focus:  Goals Group:   The focus of this group is to help patients establish daily goals to achieve during treatment and discuss how the patient can incorporate goal setting into their daily lives to aide in recovery. Rediscovering Joy:   The focus of this group is to explore various ways to relieve stress in a positive manner.    Participation Level:  Active  Participation Quality:  Appropriate  Affect:  Appropriate  Cognitive:  Appropriate  Insight: Appropriate  Engagement in Group:  Developing/Improving and Engaged  Modes of Intervention:  Activity, Discussion, and Education  Additional Comments:    Rosaura Carpenter 07/31/2023, 11:12 AM

## 2023-07-31 NOTE — Progress Notes (Signed)
Pt calm and pleasant during assessment denying SI/HI/AVH. Pt endorses anxiety and depression. Pt observed by this Clinical research associate interacting appropriately with staff and peers on the unit. Pt compliant with medication administration per MD orders. Pt given education, support, and encouragement to be active in her treatment plan. Pt being monitored Q 15 minutes for safety per unit protocol, remains safe on the unit

## 2023-07-31 NOTE — Group Note (Signed)
 Date:  07/31/2023 Time:  3:58 PM  Group Topic/Focus:  Outdoor recreation structured activity.    Participation Level:  Active  Participation Quality:  Appropriate  Affect:  Appropriate  Cognitive:  Appropriate  Insight: Appropriate  Engagement in Group:  Developing/Improving  Modes of Intervention:  Activity  Additional Comments:    Thania Woodlief 07/31/2023, 3:58 PM

## 2023-07-31 NOTE — Progress Notes (Addendum)
 Pt presents with rapaid speech, hyperverbal and needy. She constantly approached nursing making various request in addition  To focusing on domestic concerns. Pt attended select groups,  sociable with peers and appetite is good. Pt is been managed on q 15 min rounds.   07/31/23 1100  Psych Admission Type (Psych Patients Only)  Admission Status Involuntary  Psychosocial Assessment  Patient Complaints Anxiety;Depression;Sadness;Worrying  Eye Contact Intense  Facial Expression Animated  Affect Anxious  Speech Rapid;Tangential  Interaction Attention-seeking;Needy  Motor Activity Hyperactive  Appearance/Hygiene Unremarkable  Behavior Characteristics Anxious  Mood Depressed;Labile;Anxious;Preoccupied;Terrified  Thought Process  Coherency WDL  Content Blaming others;Paranoia  Delusions WDL  Perception WDL  Hallucination None reported or observed  Judgment Poor  Confusion Mild  Danger to Self  Current suicidal ideation? Denies  Agreement Not to Harm Self Yes  Description of Agreement verbal  Danger to Others  Danger to Others None reported or observed

## 2023-07-31 NOTE — BH IP Treatment Plan (Addendum)
 Interdisciplinary Treatment and Diagnostic Plan Update  07/31/2023 Time of Session: 9:26AM Kelly Carr MRN: [id number]  Principal Diagnosis: Bipolar disorder, current episode manic severe with psychotic features [facility])  Secondary Diagnoses: Principal Problem:   Bipolar disorder, current episode manic severe with psychotic features (HCC) Active Problems:   PTSD (post-traumatic stress disorder)   Cannabis abuse   Current Medications:  Current Facility-Administered Medications  Medication Dose Route Frequency Provider Last Rate Last Admin   acetaminophen (TYLENOL) tablet 650 mg  650 mg Oral Q6H PRN Penn, Cranston Neighbor, NP   650 mg at 07/30/23 0530   albuterol (PROVENTIL) (2.5 MG/3ML) 0.083% nebulizer solution 2.5 mg  2.5 mg Inhalation Q6H PRN Myriam Forehand, NP       alum & mag hydroxide-simeth (MAALOX/MYLANTA) 200-200-20 MG/5ML suspension 15 mL  15 mL Oral Q4H PRN Penn, Cranston Neighbor, NP   15 mL at 07/31/23 0703   ARIPiprazole (ABILIFY) tablet 10 mg  10 mg Oral Daily Myriam Forehand, NP   10 mg at 07/31/23 0853   [START ON 08/08/2024] ARIPiprazole ER (ABILIFY MAINTENA) injection 300 mg  300 mg Intramuscular Q28 days Myriam Forehand, NP       clonazepam Scarlette Calico) disintegrating tablet 0.125 mg  0.125 mg Oral BID Myriam Forehand, NP       diclofenac Sodium (VOLTAREN) 1 % topical gel 2 g  2 g Topical QID Myriam Forehand, NP   2 g at 07/31/23 0856   HYDROcodone-acetaminophen (NORCO/VICODIN) 5-325 MG per tablet 1 tablet  1 tablet Oral Q8H PRN Myriam Forehand, NP   1 tablet at 07/31/23 0858   hydrOXYzine (ATARAX) tablet 50 mg  50 mg Oral Q8H PRN Myriam Forehand, NP   50 mg at 07/30/23 1203   ibuprofen (ADVIL) tablet 800 mg  800 mg Oral Q8H PRN Myriam Forehand, NP   800 mg at 07/31/23 0404   lithium carbonate (LITHOBID) ER tablet 300 mg  300 mg Oral Q12H Myriam Forehand, NP   300 mg at 07/31/23 4098   magnesium hydroxide (MILK OF MAGNESIA) suspension 15 mL  15 mL Oral Daily PRN Mcneil Sober, NP       melatonin tablet 5 mg  5  mg Oral QHS Myriam Forehand, NP   5 mg at 07/30/23 2227   nicotine (NICODERM CQ - dosed in mg/24 hours) patch 14 mg  14 mg Transdermal Daily Myriam Forehand, NP   14 mg at 07/31/23 1191   nicotine polacrilex (NICORETTE) gum 2 mg  2 mg Oral PRN Sarina Ill, DO   2 mg at 07/31/23 0859   OLANZapine (ZYPREXA) injection 10 mg  10 mg Intramuscular TID PRN Mcneil Sober, NP       OLANZapine (ZYPREXA) injection 5 mg  5 mg Intramuscular TID PRN Mcneil Sober, NP       OLANZapine zydis (ZYPREXA) disintegrating tablet 5 mg  5 mg Oral TID PRN Mcneil Sober, NP   5 mg at 07/29/23 2148   ondansetron (ZOFRAN-ODT) disintegrating tablet 4 mg  4 mg Oral Q12H [name], NP   4 mg at 07/31/23 4782   traZODone (DESYREL) tablet 50 mg  50 mg Oral QHS [name], NP   50 mg at 07/30/23 2228   PTA Medications: Medications Prior to Admission  Medication Sig Dispense Refill Last Dose/Taking   albuterol (VENTOLIN HFA) 108 (90 Base) MCG/ACT inhaler Inhale 1-2 puffs into the lungs every 6 (six) hours as needed for wheezing or  shortness of breath. 18 g 2    ARIPiprazole (ABILIFY) 20 MG tablet Take 1 tablet (20 mg total) by mouth daily. 30 tablet 0    diclofenac Sodium (VOLTAREN) 1 % GEL Apply 2 g topically 4 (four) times daily as needed. 150 g 0    hydrOXYzine (ATARAX) 25 MG tablet Take 1 tablet (25 mg total) by mouth 3 (three) times daily as needed. 30 tablet 0    lithium 300 MG tablet Take 300 mg by mouth daily.      nicotine (NICODERM CQ - DOSED IN MG/24 HOURS) 14 mg/24hr patch Place 1 patch (14 mg total) onto the skin daily. 28 patch 0     Patient Stressors: Health problems   Marital or family conflict    Patient Strengths: Average or above average intelligence  Communication skills   Treatment Modalities: Medication Management, Group therapy, Case management,  1 to 1 session with clinician, Psychoeducation, Recreational therapy.   Physician Treatment Plan for Primary Diagnosis: Bipolar  disorder, current episode manic severe with psychotic features (HCC) Long Term Goal(s): Improvement in symptoms so as ready for discharge   Short Term Goals: Ability to identify changes in lifestyle to reduce recurrence of condition will improve Ability to verbalize feelings will improve Ability to disclose and discuss suicidal ideas Ability to demonstrate self-control will improve Ability to identify and develop effective coping behaviors will improve Ability to maintain clinical measurements within normal limits will improve Compliance with prescribed medications will improve Ability to identify triggers associated with substance abuse/mental health issues will improve  Medication Management: Evaluate patient's response, side effects, and tolerance of medication regimen.  Therapeutic Interventions: 1 to 1 sessions, Unit Group sessions and Medication administration.  Evaluation of Outcomes: Not Met  Physician Treatment Plan for Secondary Diagnosis: Principal Problem:   Bipolar disorder, current episode manic severe with psychotic features (HCC) Active Problems:   PTSD (post-traumatic stress disorder)   Cannabis abuse  Long Term Goal(s): Improvement in symptoms so as ready for discharge   Short Term Goals: Ability to identify changes in lifestyle to reduce recurrence of condition will improve Ability to verbalize feelings will improve Ability to disclose and discuss suicidal ideas Ability to demonstrate self-control will improve Ability to identify and develop effective coping behaviors will improve Ability to maintain clinical measurements within normal limits will improve Compliance with prescribed medications will improve Ability to identify triggers associated with substance abuse/mental health issues will improve     Medication Management: Evaluate patient's response, side effects, and tolerance of medication regimen.  Therapeutic Interventions: 1 to 1 sessions, Unit Group  sessions and Medication administration.  Evaluation of Outcomes: Not Met   RN Treatment Plan for Primary Diagnosis: Bipolar disorder, current episode manic severe with psychotic features (HCC) Long Term Goal(s): Knowledge of disease and therapeutic regimen to maintain health will improve  Short Term Goals: Ability to demonstrate self-control, Ability to participate in decision making will improve, Ability to verbalize feelings will improve, Ability to disclose and discuss suicidal ideas, Ability to identify and develop effective coping behaviors will improve, and Compliance with prescribed medications will improve  Medication Management: RN will administer medications as ordered by provider, will assess and evaluate patient's response and provide education to patient for prescribed medication. RN will report any adverse and/or side effects to prescribing provider.  Therapeutic Interventions: 1 on 1 counseling sessions, Psychoeducation, Medication administration, Evaluate responses to treatment, Monitor vital signs and CBGs as ordered, Perform/monitor CIWA, COWS, AIMS and Fall Risk screenings as  ordered, Perform wound care treatments as ordered.  Evaluation of Outcomes: Not Met   LCSW Treatment Plan for Primary Diagnosis: Bipolar disorder, current episode manic severe with psychotic features (HCC) Long Term Goal(s): Safe transition to appropriate next level of care at discharge, Engage patient in therapeutic group addressing interpersonal concerns.  Short Term Goals: Engage patient in aftercare planning with referrals and resources, Increase social support, Increase ability to appropriately verbalize feelings, Increase emotional regulation, and Facilitate acceptance of mental health diagnosis and concerns  Therapeutic Interventions: Assess for all discharge needs, 1 to 1 time with Social worker, Explore available resources and support systems, Assess for adequacy in community support network,  Educate family and significant other(s) on suicide prevention, Complete Psychosocial Assessment, Interpersonal group therapy.  Evaluation of Outcomes: Not Met   Progress in Treatment: Attending groups: Yes. Participating in groups: Yes. Taking medication as prescribed: Yes. Toleration medication: Yes. Family/Significant other contact made: No, will contact:  once permission has been granted Patient understands diagnosis: Yes. Discussing patient identified problems/goals with staff: Yes. Medical problems stabilized or resolved: Yes. Denies suicidal/homicidal ideation: Yes. Issues/concerns per patient self-inventory: No. Other: none  New problem(s) identified: No, Describe:  none  New Short Term/Long Term Goal(s): detox, elimination of symptoms of psychosis, medication management for mood stabilization; elimination of SI thoughts; development of comprehensive mental wellness/sobriety plan.   Patient Goals:  "get the right medication regiment and take them"  Discharge Plan or Barriers: 1-7 days  Reason for Continuation of Hospitalization: Anxiety Delusions  Depression Mania Medication stabilization Suicidal ideation  Estimated Length of Stay:  What talents or skills do you have that you aren't using often enough in your work today? What do you enjoy most about your job/work? If you could change one thing about your work, your role or your responsibilities, or our department what would it be? What type of support or assistance do you need from me?  Last 3 [place] Suicide Severity Risk Score: Flowsheet Row Admission (Current) from 07/29/2023 in [facility] INPATIENT BE[facility]ICINE Most recent reading at 07/30/2023  6:00 AM ED from 07/29/2023 in [facility] Emergency Department at [facility] Most recent reading at 07/29/2023  2:48 AM ED from 07/27/2023 in [facility] Department at [facility] Most recent reading at 07/27/2023  6:35 AM  C-SSRS RISK CATEGORY No Risk  No Risk No Risk       Last PHQ 2/9 Scores:    07/07/2023    9:52 AM 06/26/2023    1:52 PM 06/22/2023   11:37 AM  Depression screen PHQ 2/9  Decreased Interest 0 0 0  Down, Depressed, Hopeless 0 0 0  PHQ - 2 Score 0 0 0  Altered sleeping 0 0 0  Tired, decreased energy 0 0 0  Change in appetite 0 0 0  Feeling bad or failure about yourself  0 0 0  Trouble concentrating 0 0 0  Moving slowly or fidgety/restless 0 0 0  Suicidal thoughts 0 0 0  PHQ-9 Score 0 0 0  Difficult doing work/chores   Not difficult at all    Scribe for Treatment Team: Harden Mo, LCSW 07/31/2023 10:49 AM

## 2023-07-31 NOTE — Group Note (Signed)
 Recreation Therapy Group Note   Group Topic:Health and Wellness  Group Date: 07/31/2023 Start Time: 1500 End Time: 1600 Facilitators: Rosina Lowenstein, LRT, CTRS Location: Courtyard  Group Description: [employer]. LRT and patients played games of basketball, drew with chalk, and played corn hole while outside in the courtyard while getting fresh air and sunlight. Music was being played in the background. LRT and peers conversed about different games they have played before, what they do in their free time and anything else that is on their minds. LRT encouraged pts to drink water after being outside, sweating and getting their heart rate up.  Goal Area(s) Addressed: Patient will build on frustration tolerance skills. Patients will partake in a competitive play game with peers. Patients will gain knowledge of new leisure interest/hobby.   Affect/Mood: Appropriate   Participation Level: Active   Participation Quality: Independent   Behavior: Appropriate   Speech/Thought Process: Coherent   Insight: Good   Judgement: Fair    Modes of Intervention: Activity   Patient Response to Interventions:  Receptive   Education Outcome:  Acknowledges education   Clinical Observations/Individualized Feedback: Dolly was active in their participation of session activities and group discussion. Pt chose to play basketball with peers while outside. Pt interacted well with LRT and peers duration of session.    Plan: Continue to engage patient in RT group sessions 2-3x/week.   [address] LRT, CTRS 07/31/2023 5:11 PM
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
## 2023-07-31 NOTE — Plan of Care (Signed)

## 2023-07-31 NOTE — Group Note (Signed)
 Date:  07/31/2023 Time:  8:49 PM  Group Topic/Focus:  Wrap-Up Group:   The focus of this group is to help patients review their daily goal of treatment and discuss progress on daily workbooks.    Participation Level:  Active  Participation Quality:  Appropriate  Affect:  Appropriate  Cognitive:  Appropriate  Insight: Appropriate  Engagement in Group:  Engaged  Modes of Intervention:  Activity  Additional Comments:    Kelly Carr 07/31/2023, 8:49 PM

## 2023-07-31 NOTE — Group Note (Signed)
 LCSW Group Therapy Note   Group Date: 07/31/2023 Start Time: 1300 End Time: 1400   Type of Therapy and Topic:  Group Therapy: Challenging Core Beliefs  Participation Level:  Active  Description of Group:  Patients were educated about core beliefs and asked to identify one harmful core belief that they have. Patients were asked to explore from where those beliefs originate. Patients were asked to discuss how those beliefs make them feel and the resulting behaviors of those beliefs. They were then be asked if those beliefs are true and, if so, what evidence they have to support them. Lastly, group members were challenged to replace those negative core beliefs with helpful beliefs.   Therapeutic Goals:   1. Patient will identify harmful core beliefs and explore the origins of such beliefs. 2. Patient will identify feelings and behaviors that result from those core beliefs. 3. Patient will discuss whether such beliefs are true. 4.  Patient will replace harmful core beliefs with helpful ones.  Summary of Patient Progress:  Patient actively engaged in processing and exploring how core beliefs are formed and how they impact thoughts, feelings, and behaviors. Patient proved open to input from peers and feedback from CSW. Patient demonstrated proficient insight into the subject matter, was respectful and supportive of peers, and participated throughout the entire session.  Therapeutic Modalities: Cognitive Behavioral Therapy; Solution-Focused Therapy   Perrin Smack 07/31/2023  1:57 PM

## 2023-07-31 NOTE — Plan of Care (Signed)
  Problem: Education: Goal: Emotional status will improve Outcome: Progressing   Problem: Education: Goal: Mental status will improve Outcome: Progressing   Problem: Activity: Goal: Interest or engagement in activities will improve Outcome: Progressing   Problem: Activity: Goal: Sleeping patterns will improve Outcome: Progressing   

## 2023-07-31 NOTE — Progress Notes (Signed)
    07/31/23 0200  Psych Admission Type (Psych Patients Only)  Admission Status Involuntary  Psychosocial Assessment  Patient Complaints Restlessness  Eye Contact Intense  Facial Expression Animated  Affect Anxious  Speech Soft  Interaction Attention-seeking  Motor Activity Hyperactive  Appearance/Hygiene Unremarkable  Behavior Characteristics Hyperactive  Mood Preoccupied  Thought Process  Coherency WDL  Content WDL  Delusions WDL  Perception WDL  Hallucination None reported or observed  Judgment Poor  Confusion Mild  Danger to Self  Current suicidal ideation? Denies  Danger to Others  Danger to Others None reported or observed

## 2023-08-01 LAB — LIPID PANEL
Cholesterol: 184 mg/dL (ref 0–200)
HDL: 38 mg/dL — ABNORMAL LOW (ref >40)
LDL Cholesterol: 115 mg/dL — ABNORMAL HIGH (ref 0–99)
Total CHOL/HDL Ratio: 4.8 {ratio}
Triglycerides: 153 mg/dL — ABNORMAL HIGH (ref <150)
VLDL: 31 mg/dL (ref 0–40)

## 2023-08-01 MED ORDER — PRAZOSIN HCL 1 MG PO CAPS
1.0000 mg | ORAL_CAPSULE | Freq: Every day | ORAL | Status: DC
  Administered 2023-08-01 – 2023-08-07 (×7): 1 mg via ORAL
  Filled 2023-08-01 (×7): qty 1
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
## 2023-08-01 NOTE — Plan of Care (Signed)
   Problem: Education: Goal: Knowledge of [facility] Education information/materials will improve Outcome: Progressing Goal: Emotional status will improve Outcome: Progressing Goal: Mental status will improve Outcome: Progressing

## 2023-08-01 NOTE — Group Note (Signed)
 Recreation Therapy Group Note   Group Topic:Health and Wellness  Group Date: 08/01/2023 Start Time: 1440 End Time: 1600 Facilitators: Rosina Lowenstein, LRT, CTRS Location: Courtyard  Group Description: [employer]. LRT and patients played games of basketball, drew with chalk, and played corn hole while outside in the courtyard while getting fresh air and sunlight. Music was being played in the background. LRT and peers conversed about different games they have played before, what they do in their free time and anything else that is on their minds. LRT encouraged pts to drink water after being outside, sweating and getting their heart rate up.  Goal Area(s) Addressed: Patient will build on frustration tolerance skills. Patients will partake in a competitive play game with peers. Patients will gain knowledge of new leisure interest/hobby.    Affect/Mood: Appropriate   Participation Level: Active   Participation Quality: Independent   Behavior: Appropriate   Speech/Thought Process: Coherent   Insight: Fair   Judgement: Fair    Modes of Intervention: Activity   Patient Response to Interventions:  Engaged   Education Outcome:  Acknowledges education   Clinical Observations/Individualized Feedback: Kelly Carr was active in their participation of session activities and group discussion. Pt chose to play basketball and listen to music while outside in group. Pt interacted well with LRT and peers duration of session.    Plan: Continue to engage patient in RT group sessions 2-3x/week.   [address], LRT, CTRS 08/01/2023 5:02 PM

## 2023-08-01 NOTE — Group Note (Signed)
 Date:  08/01/2023 Time:  9:44 AM  Group Topic/Focus:  Goals Group:   The focus of this group is to help patients establish daily goals to achieve during treatment and discuss how the patient can incorporate goal setting into their daily lives to aide in recovery.    Participation Level:  Active  Participation Quality:  Appropriate  Affect:  Appropriate  Cognitive:  Appropriate  Insight: Appropriate  Engagement in Group:  Engaged  Modes of Intervention:  Discussion, Education, and Support  Additional Comments:    Wilford Corner 08/01/2023, 9:44 AM

## 2023-08-01 NOTE — Group Note (Signed)
 [facility] LCSW Group Therapy Note   Group Date: 08/01/2023 Start Time: 1315 End Time: 1415   Type of Therapy and Topic: Group Therapy: Avoiding Self-Sabotaging and Enabling Behaviors  Participation Level: Active  Mood:  Description of Group:  In this group, patients will learn how to identify obstacles, self-sabotaging and enabling behaviors, as well as: what are they, why do we do them and what needs these behaviors meet. Discuss unhealthy relationships and how to have positive healthy boundaries with those that sabotage and enable. Explore aspects of self-sabotage and enabling in yourself and how to limit these self-destructive behaviors in everyday life.   Therapeutic Goals: 1. Patient will identify one obstacle that relates to self-sabotage and enabling behaviors 2. Patient will identify one personal self-sabotaging or enabling behavior they did prior to admission 3. Patient will state a plan to change the above identified behavior 4. Patient will demonstrate ability to communicate their needs through discussion and/or role play.    Summary of Patient Progress: Patient was present in group. Paitnet was engaged and supportive. Patient identified her self-sabotage as caring too much for others and not establishing boundaries.  Pt was somewhat manic with rapid speech and flight of ideas, however patient was able to be redirected.  Patient left group early once triggered by another patient.   Therapeutic Modalities:  Cognitive Behavioral Therapy Person-Centered Therapy Motivational Interviewing    Harden Mo, LCSW
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
## 2023-08-01 NOTE — Progress Notes (Deleted)
   08/01/23 0815  Charting Type  Charting Type Shift assessment  Safety Check Verification  Has the RN verified the 15 minute safety check completion? Yes  Neurological  Neuro (WDL) WDL  HEENT  HEENT (WDL) WDL  Respiratory  Respiratory (WDL) WDL  Cardiac  Cardiac (WDL) WDL  Vascular  Vascular (WDL) WDL  Integumentary  Integumentary (WDL) WDL  Braden Scale (Ages 8 and up)  Sensory Perceptions 4  Moisture 4  Activity 4  Mobility 4  Nutrition 3  Friction and Shear 3  Braden Scale Score 22  Musculoskeletal  Musculoskeletal (WDL) X  Assistive Device None  Musculoskeletal Details  RLE Limited movement  Right Ankle Ortho/Supportive Device;Injury/trauma  Left Knee Swelling  Gastrointestinal  Gastrointestinal (WDL) WDL  Last BM Date  07/31/23  GU Assessment  Genitourinary (WDL) WDL  Neurological  Level of Consciousness Alert  Psychosocial  Emotional support given Given to patient

## 2023-08-01 NOTE — Plan of Care (Signed)
  Problem: Education: Goal: Knowledge of [facility] Education information/materials will improve Outcome: Progressing Goal: Emotional status will improve Outcome: Progressing Goal: Mental status will improve Outcome: Progressing   Problem: Activity: Goal: Interest or engagement in activities will improve Outcome: Progressing   Problem: Health Behavior/Discharge Planning: Goal: Compliance with treatment plan for underlying cause of condition will improve Outcome: Progressing   Problem: Physical Regulation: Goal: Ability to maintain clinical measurements within normal limits will improve Outcome: Progressing   Problem: Safety: Goal: Periods of time without injury will increase Outcome: Progressing

## 2023-08-02 MED ORDER — LITHIUM CARBONATE ER 450 MG PO TBCR
450.0000 mg | EXTENDED_RELEASE_TABLET | Freq: Two times a day (BID) | ORAL | Status: DC
  Administered 2023-08-02 – 2023-08-05 (×6): 450 mg via ORAL
  Filled 2023-08-02 (×6): qty 1

## 2023-08-02 NOTE — Group Note (Signed)
 Date:  08/02/2023 Time:  10:13 AM  Group Topic/Focus:  Making Healthy Choices:   The focus of this group is to help patients identify negative/unhealthy choices they were using prior to admission and identify positive/healthier coping strategies to replace them upon discharge. Self Care:   The focus of this group is to help patients understand the importance of self-care in order to improve or restore emotional, physical, spiritual, interpersonal, and financial health. Spirituality:   The focus of this group is to discuss how one's spirituality can aide in recovery.    Participation Level:  Active  Participation Quality:  Appropriate  Affect:  Appropriate  Cognitive:  Alert, Appropriate, and Oriented  Insight: Appropriate  Engagement in Group:  Developing/Improving and Engaged  Modes of Intervention:  Activity, Discussion, and Education  Additional Comments:    Rosaura Carpenter 08/02/2023, 10:13 AM

## 2023-08-02 NOTE — Progress Notes (Signed)
   08/02/23 0934  Psych Admission Type (Psych Patients Only)  Admission Status Involuntary  Psychosocial Assessment  Patient Complaints None  Eye Contact Brief  Facial Expression Anxious  Affect Anxious  Speech Logical/coherent  Interaction Assertive  Motor Activity Slow  Appearance/Hygiene Unremarkable  Behavior Characteristics Calm  Mood Pleasant   Patient remains needy. Ortho boot continued with no discomfort but occasionally complained of pain to knee and foot. Medicated as ordered with relief each time.

## 2023-08-02 NOTE — Group Note (Signed)
 [facility] LCSW Group Therapy Note   Group Date: 08/02/2023 Start Time: 1400 End Time: 1500  Type of Therapy and Topic:  Group Therapy:  Feelings around Relapse and Recovery  Participation Level:  Did Not Attend    Description of Group:    Patients in this group will discuss emotions they experience before and after a relapse. They will process how experiencing these feelings, or avoidance of experiencing them, relates to having a relapse. Facilitator will guide patients to explore emotions they have related to recovery. Patients will be encouraged to process which emotions are more powerful. They will be guided to discuss the emotional reaction significant others in their lives may have to patients' relapse or recovery. Patients will be assisted in exploring ways to respond to the emotions of others without this contributing to a relapse.  Therapeutic Goals: Patient will identify two or more emotions that lead to relapse for them:  Patient will identify two emotions that result when they relapse:  Patient will identify two emotions related to recovery:  Patient will demonstrate ability to communicate their needs through discussion and/or role plays.   Summary of Patient Progress: X   Therapeutic Modalities:   Cognitive Behavioral Therapy Solution-Focused Therapy Assertiveness Training Relapse Prevention Therapy   Glenis Smoker, LCSW

## 2023-08-02 NOTE — Progress Notes (Signed)
 [facility] MD Progress Note  08/02/2023 7:51 PM Kelly Carr  MRN:  [id number]  53 year old Caucasian female with reported hx of bipolar disorder, polysubstance abuse, brought in via EMS from a local motel after being taken in by [facility] PD and subsequently evaluated at [facility]. UDS was positive for TCA, BDZ, THC. Reportedly had been using a friend's BDZ. Upon presentation patient presented with sxs of mania, grandiose delusions that she was about to get married to a celebrity, has an album coming out soon, pressured speech, flight of ideas.    Subjective:  Pt is seen fro reassessment, all vitals and staff notes reviewed. No reported behavioral issues overnight. Pt seen for reassessment, reports that she is doing good. Some improvements in sxs, speech is less pressured, still somewhat rapid. Less perseverative on getting restraining order against her ex. Presents with grandiose delusions today, states that she has an album coming out soon, that she has been texting the celebrity [name] prior to admission and that they are planning to get married. Reports improved sleep overnight, no nightmares. Reports good appetite. She was on the unit, handing out hand written letters to various staff members expressing gratitude for care on unit. She is denying depressed mood, anxiety, SI/HI and AVH. No observed RTIS. Alert and oriented x4. Affect is calm and cooperative during reassessment. Insight and judgement impaired.    Principal Problem: Bipolar disorder, current episode manic severe with psychotic features (HCC) Diagnosis: Principal Problem:   Bipolar disorder, current episode manic severe with psychotic features (HCC) Active Problems:   PTSD (post-traumatic stress disorder)   Cannabis abuse  Total Time spent with patient: 30 minutes  Past Psychiatric History: see H&P  Past Medical History:  Past Medical History:  Diagnosis Date   Abnormal Pap smear    Unknown results>colpo>normal   Abscess 10/14/2021    Anxiety    Arthritis    Asthma    Bipolar 1 disorder (HCC)    Cervical strain 05/04/2021   Depression    Forearm strain, left, initial encounter 05/04/2021   Labral tear of hip joint 12/27/2014   Orbital floor (blow-out) closed fracture (HCC) 10/17/2018   Orbital floor (blow-out) closed fracture (HCC) 10/17/2018   Paresthesia and pain of extremity 04/07/2021   PTSD (post-traumatic stress disorder)    Rotator cuff tendinitis, right 05/04/2021    Past Surgical History:  Procedure Laterality Date   NO PAST SURGERIES     Family History:  Family History  Problem Relation Age of Onset   Diabetes Mother    Hypertension Mother    Heart disease Mother 29   Schizophrenia Mother    Diabetes Maternal Grandmother    Heart disease Maternal Grandmother    Diabetes Maternal Grandfather    Heart disease Maternal Grandfather    Depression Daughter    Bipolar disorder Cousin    Bipolar disorder Nephew    Family Psychiatric  History:  see h&P Social History:  Social History   Substance and Sexual Activity  Alcohol Use Not Currently     Social History   Substance and Sexual Activity  Drug Use Not Currently   Types: Marijuana   Comment: just a few few times since LOV 10/13/2022    Social History   Socioeconomic History   Marital status: Single    Spouse name: Not on file   Number of children: Not on file   Years of education: Not on file   Highest education level: Not on file  Occupational History  Not on file  Tobacco Use   Smoking status: Former    Passive exposure: Past   Smokeless tobacco: Never  Vaping Use   Vaping status: Every Day   Substances: Nicotine, Flavoring  Substance and Sexual Activity   Alcohol use: Not Currently   Drug use: Not Currently    Types: Marijuana    Comment: just a few few times since LOV 10/13/2022   Sexual activity: Yes    Partners: Male    Birth control/protection: None  Other Topics Concern   Not on file  Social History Narrative    Not on file   Social Drivers of Health   Financial Resource Strain: Not on file  Food Insecurity: No Food Insecurity (07/30/2023)   Hunger Vital Sign    Worried About Running Out of Food in the Last Year: Never true    Ran Out of Food in the Last Year: Never true  Transportation Needs: No Transportation Needs (07/30/2023)   PRAPARE - Transportation    Lack of Transportation (Medical): No    Lack of Transportation (Non-Medical): No  Recent Concern: Transportation Needs - Unmet Transportation Needs (07/19/2023)   PRAPARE - Administrator, Civil Service (Medical): Yes    Lack of Transportation (Non-Medical): Yes  Physical Activity: Not on file  Stress: Not on file  Social Connections: Moderately Isolated (07/19/2023)   Social Connection and Isolation Panel [NHANES]    Frequency of Communication with Friends and Family: More than three times a week    Frequency of Social Gatherings with Friends and Family: Once a week    Attends Religious Services: Never    Database administrator or Organizations: No    Attends Engineer, structural: Never    Marital Status: Living with partner   Additional Social History:                         Sleep: Good  Appetite:  Good  Current Medications: Current Facility-Administered Medications  Medication Dose Route Frequency Provider Last Rate Last Admin   acetaminophen (TYLENOL) tablet 650 mg  650 mg Oral Q6H PRN Penn, Cranston Neighbor, NP   650 mg at 08/02/23 1452   albuterol (PROVENTIL) (2.5 MG/3ML) 0.083% nebulizer solution 2.5 mg  2.5 mg Inhalation Q6H PRN Myriam Forehand, NP       alum & mag hydroxide-simeth (MAALOX/MYLANTA) 200-200-20 MG/5ML suspension 15 mL  15 mL Oral Q4H PRN Penn, Cranston Neighbor, NP   15 mL at 08/01/23 1037   ARIPiprazole (ABILIFY) tablet 10 mg  10 mg Oral Daily Myriam Forehand, NP   10 mg at 08/02/23 1610   [START ON 08/08/2024] ARIPiprazole ER (ABILIFY MAINTENA) injection 300 mg  300 mg Intramuscular Q28 days Myriam Forehand, NP       diclofenac Sodium (VOLTAREN) 1 % topical gel 2 g  2 g Topical QID Myriam Forehand, NP   2 g at 08/02/23 1211   hydrocortisone cream 1 %   Topical BID PRN Anahi Belmar, PA-C       hydrOXYzine (ATARAX) tablet 50 mg  50 mg Oral Q8H PRN Myriam Forehand, NP   50 mg at 08/01/23 0604   ibuprofen (ADVIL) tablet 800 mg  800 mg Oral Q8H PRN Myriam Forehand, NP   800 mg at 08/02/23 1727   lithium carbonate (ESKALITH) ER tablet 450 mg  450 mg Oral Q12H Delores Edelstein, Judeth Cornfield, PA-C  loperamide (IMODIUM) capsule 2 mg  2 mg Oral PRN Rayan Ines, PA-C   2 mg at 07/31/23 1231   magnesium hydroxide (MILK OF MAGNESIA) suspension 15 mL  15 mL Oral Daily PRN Mcneil Sober, NP       melatonin tablet 5 mg  5 mg Oral QHS Myriam Forehand, NP   5 mg at 08/01/23 2127   nicotine (NICODERM CQ - dosed in mg/24 hours) patch 14 mg  14 mg Transdermal Daily Myriam Forehand, NP   14 mg at 08/02/23 0454   nicotine polacrilex (NICORETTE) gum 2 mg  2 mg Oral PRN Sarina Ill, DO   2 mg at 08/02/23 1728   OLANZapine (ZYPREXA) injection 10 mg  10 mg Intramuscular TID PRN Mcneil Sober, NP       OLANZapine (ZYPREXA) injection 5 mg  5 mg Intramuscular TID PRN Mcneil Sober, NP       OLANZapine zydis (ZYPREXA) disintegrating tablet 5 mg  5 mg Oral TID PRN Mcneil Sober, NP   5 mg at 07/29/23 2148   ondansetron (ZOFRAN-ODT) disintegrating tablet 4 mg  4 mg Oral Q12H [name], NP   4 mg at 08/02/23 0981   prazosin (MINIPRESS) capsule 1 mg  1 mg Oral QHS Mirakle Tomlin, PA-C   1 mg at 08/01/23 2127   traZODone (DESYREL) tablet 50 mg  50 mg Oral QHS [name], NP   50 mg at 08/01/23 2127    Lab Results:  Results for orders placed or performed during the hospital encounter of 07/29/23 (from the past 48 hours)  Lipid panel     Status: Abnormal   Collection Time: 08/01/23  9:33 AM  Result Value Ref Range   Cholesterol 184 0 - 200 mg/dL   Triglycerides 191 (H) <150 mg/dL   HDL 38 (L) >47 mg/dL    Total CHOL/HDL Ratio 4.8 RATIO   VLDL 31 0 - 40 mg/dL   LDL Cholesterol 829 (H) 0 - 99 mg/dL    Comment:        Total Cholesterol/HDL:CHD Risk Coronary Heart Disease Risk Table                     Men   Women  1/2 Average Risk   3.4   3.3  Average Risk       5.0   4.4  2 X Average Risk   9.6   7.1  3 X Average Risk  23.4   11.0        Use the calculated Patient Ratio above and the CHD Risk Table to determine the patient's CHD Risk.        ATP III CLASSIFICATION (LDL):  <100     mg/dL   Optimal  562-130  mg/dL   Near or Above                    Optimal  130-159  mg/dL   Borderline  865-784  mg/dL   High  >696     mg/dL   Very High Performed at [facility], [address] [place], [place] [address]     Blood Alcohol level:  Lab Results  Component Value Date   [facility] <10 07/29/2023   ETH <10 07/11/2023    Metabolic Disorder Labs: Lab Results  Component Value Date   HGBA1C 5.5 07/11/2023   MPG 111.15 07/11/2023   MPG 111.15 02/04/2021   Lab Results  Component  Value Date   PROLACTIN 39.5 (H) 04/12/2018   PROLACTIN 2.0 12/05/2011   Lab Results  Component Value Date   CHOL 184 08/01/2023   TRIG 153 (H) 08/01/2023   HDL 38 (L) 08/01/2023   CHOLHDL 4.8 08/01/2023   VLDL 31 08/01/2023   LDLCALC 115 (H) 08/01/2023   LDLCALC 109 (H) 09/05/2022    Physical Findings: AIMS: Facial and Oral Movements Muscles of Facial Expression: None Lips and Perioral Area: None Jaw: None Tongue: None,Extremity Movements Upper (arms, wrists, hands, fingers): None, Trunk Movements Neck, shoulders, hips: None, Global Judgements Severity of abnormal movements overall : None Incapacitation due to abnormal movements: None Patient's awareness of abnormal movements: No Awareness, Dental Status Current problems with teeth and/or dentures?: No Does patient usually wear dentures?: No  CIWA:    COWS:     Musculoskeletal: Strength & Muscle Tone:  orthopedic boot on R. Foot  injury  Gait & Station: unsteady, ambulating with orthopedic boot on r. Foot  Patient leans: N/A  Psychiatric Specialty Exam:  Presentation  General Appearance:  Appropriate for Environment  Eye Contact: Good  Speech: Clear and Coherent  Speech Volume: Increased  Handedness: Right   Mood and Affect  Mood: Anxious  Affect: Appropriate   Thought Process  Thought Processes: Goal Directed  Descriptions of Associations:Tangential  Orientation:Full (Time, Place and Person)  Thought Content:Perseveration; Logical  History of Schizophrenia/Schizoaffective disorder:No  Duration of Psychotic Symptoms:Greater than six months  Hallucinations:No data recorded  Ideas of Reference:None  Suicidal Thoughts:No data recorded  Homicidal Thoughts:No data recorded   Sensorium  Memory: Immediate Good; Recent Good; Remote Good  Judgment: Fair  Insight: Fair   Art therapist  Concentration: Fair  Attention Span: Fair  Recall: Good  Fund of Knowledge: Good  Language: Good   Psychomotor Activity  Psychomotor Activity: No data recorded   Assets  Assets: Desire for Improvement; Communication Skills   Sleep  Sleep: Reports good sleep overnight    Physical Exam: Physical Exam Vitals and nursing note reviewed.  Constitutional:      Appearance: Normal appearance.  HENT:     Head: Normocephalic.  Eyes:     Pupils: Pupils are equal, round, and reactive to light.  Pulmonary:     Effort: Pulmonary effort is normal.  Musculoskeletal:     Cervical back: Normal range of motion.     Comments: Ambulating with orthopedic boot on right foot, secondary to injury   Skin:    General: Skin is warm and dry.     Comments: Blisters on soles of feet   Neurological:     General: No focal deficit present.     Mental Status: She is alert and oriented to person, place, and time.  Psychiatric:        Attention and Perception: Attention and perception  normal.        Mood and Affect: Mood normal. Affect is labile.        Speech: Speech is tangential.        Behavior: Behavior is cooperative.        Thought Content: Thought content is delusional.        Cognition and Memory: Cognition normal.        Judgment: Judgment is impulsive.    Review of Systems  Musculoskeletal:        Right foot injury, ambulating with orthopedic boot   Skin:        Blisters on soles of feet   Psychiatric/Behavioral:  Delusions   All other systems reviewed and are negative.  Blood pressure 133/76, pulse (!) 108, temperature (!) 97.2 F (36.2 C), resp. rate 18, height 5\' 4"  (1.626 m), weight 76.3 kg, SpO2 97%. Body mass index is 28.87 kg/m.   Treatment Plan Summary:  1.    Safety and Monitoring:   -- Involuntary admission to inpatient psychiatric unit for safety, stabilization and treatment -- Daily contact with patient to assess and evaluate symptoms and progress in treatment -- Patient's case to be discussed in multi-disciplinary team meeting -- Observation Level : q15 minute checks -- Vital signs:  q12 hours -- Precautions: suicide   2. Psychiatric Diagnoses and Treatment:   08/02/2023 -- Increase Lithium to 450 BID for bipolar/mood stabilization -- Continue Prazosin 1 mg HS for nightmares  -- Continue Abilify 10mg  PO at bedtime for mood stabilization/psychosis. Plan to down titrate and switch to another antipsychotic d/t poor efficacy, continued delusions.  -- Abilify Maintena 300 mg IM next dose sue 08/09/2023  -- Continue Klonopin taper  08/01/2023 -- Initiate Prazosin 1 mg HS for nightmares -- Continue other meds as below  07/31/2023  -- Continue Lithium 300 mg BID for bipolar d/o/acute mania   -- Continue Abilify 10mg  PO at bedtime for mood stabilization/psychosis   -- Abilify Maintena 300 mg IM next dose sue 08/09/2023  -- Continue Klonopin taper  --  The risks/benefits/side-effects/alternatives to this medication were  discussed in detail with the patient and time was given for questions. The patient consents to medication trial.  -- Metabolic profile and EKG monitoring obtained while on an atypical antipsychotic  (BMI: 28.87; Lipid Panel: LDL 115, TG 153 both mildly elevated;  HbgA1c: 5.5 on 07/11/23)  -- Encouraged patient to participate in unit milieu and in scheduled group therapies  -- Short Term Goals: Ability to identify changes in lifestyle to reduce recurrence of condition will improve, Ability to verbalize feelings will improve, Ability to disclose and discuss suicidal ideas, Ability to demonstrate self-control will improve, Ability to identify and develop effective coping behaviors will improve, Ability to maintain clinical measurements within normal limits will improve, Compliance with prescribed medications will improve, and Ability to identify triggers associated with substance abuse/mental health issues will improve -- Long Term Goals: Improvement in symptoms so as ready for discharge        3. Medical Issues Being Addressed:    R. LE injury              -- Voltaren gel QID to the right knee as directed for local inflammation.             -- Orthopedic boot for right foot to support mobility and protect against further injury. -- Vicodin (1 tablet as needed over 24 hours) as needed for pain -- Motrin 800?mg every 8 hours as needed, with careful monitoring given her heightened sensitivity and history of musculoskeletal complaints.              Respiratory disease   -- Continue Proventil 2.5 mg Q6H PRN    Diarrhea/Hemorrhoids  -- Start Imodium 2 mg PRN for loose stools  -- Preparation H topical ointment/cream BID PRN                Tobacco Use Disorder             -- Nicotine gum 2mg  PRN             -- Smoking cessation encouraged   4. Discharge Planning:   --  Social work and case management to assist with discharge planning and identification of hospital follow-up needs prior to  discharge -- Estimated LOS: 5-7 days -- Discharge Concerns: Need to establish a safety plan; Medication compliance and effectiveness -- Discharge Goals: Return home with outpatient referrals for mental health follow-up including medication management/psychotherapy   Paulene Floor, PA-C 08/02/2023, 7:51 PM

## 2023-08-02 NOTE — Progress Notes (Signed)
 Pt reported visit with 51 y.o daughter went well; daughter is supportive.   Visible in the milieu, interactive with selective peers.  Remains manic - restless, disorganized (came out of room at about 0300 to ask for bathroom) and rambling speech - tangential in nature.  C/o LE pain, encourage rest.    08/01/23 2200  Psych Admission Type (Psych Patients Only)  Admission Status Involuntary  Psychosocial Assessment  Patient Complaints None  Eye Contact Intense  Facial Expression Animated  Affect Anxious  Speech Tangential  Interaction Assertive  Motor Activity Slow  Appearance/Hygiene Unremarkable  Behavior Characteristics Calm  Mood Pleasant  Thought Process  Coherency Disorganized  Content WDL  Delusions WDL  Perception WDL  Hallucination None reported or observed  Judgment Limited  Confusion Mild  Danger to Self  Current suicidal ideation? Denies  Agreement Not to Harm Self Yes  Description of Agreement Verbal  Danger to Others  Danger to Others None reported or observed   roblem: Education: Goal: Knowledge of [place] [facility] Education information/materials will improve Outcome: Progressing Goal: Emotional status will improve Outcome: Progressing Goal: Mental status will improve Outcome: Progressing Goal: Verbalization of understanding the information provided will improve Outcome: Progressing   Problem: Activity: Goal: Interest or engagement in activities will improve Outcome: Progressing Goal: Sleeping patterns will improve Outcome: Progressing   Problem: Coping: Goal: Ability to verbalize frustrations and anger appropriately will improve Outcome: Progressing Goal: Ability to demonstrate self-control will improve Outcome: Progressing

## 2023-08-02 NOTE — Group Note (Signed)
 Date:  08/02/2023 Time:  11:26 PM  Group Topic/Focus:  Wrap-Up Group:   The focus of this group is to help patients review their daily goal of treatment and discuss progress on daily workbooks.    Participation Level:  Active  Participation Quality:  Appropriate and Attentive  Affect:  Appropriate  Cognitive:  Appropriate  Insight: Appropriate and Good  Engagement in Group:  Engaged and Supportive  Modes of Intervention:  Discussion  Additional Comments:     Belva Crome 08/02/2023, 11:26 PM

## 2023-08-02 NOTE — Progress Notes (Signed)
   08/02/23 1530  Spiritual Encounters  Type of Visit Initial  Care provided to: Patient  Conversation partners present during encounter Nurse  Reason for visit Routine spiritual support  OnCall Visit No   Chaplain visited with patient while rounding on the [facility] Unit.  Chaplain engaged patient in conversation and offered a compassionate presence and reflective listening.    Rev. Rana M. Earlene Plater, MDiv Chaplain Resident  [facility]

## 2023-08-03 MED ORDER — FLUPHENAZINE HCL 5 MG PO TABS
7.5000 mg | ORAL_TABLET | Freq: Two times a day (BID) | ORAL | Status: DC
  Administered 2023-08-04: 7.5 mg via ORAL
  Filled 2023-08-03 (×4): qty 1

## 2023-08-03 MED ORDER — FLUPHENAZINE HCL 5 MG PO TABS
5.0000 mg | ORAL_TABLET | Freq: Two times a day (BID) | ORAL | Status: AC
  Administered 2023-08-03 – 2023-08-04 (×2): 5 mg via ORAL
  Filled 2023-08-03 (×3): qty 1

## 2023-08-03 MED ORDER — ARIPIPRAZOLE 5 MG PO TABS
5.0000 mg | ORAL_TABLET | Freq: Every day | ORAL | Status: AC
  Administered 2023-08-04: 5 mg via ORAL
  Filled 2023-08-03: qty 1

## 2023-08-03 MED ORDER — FLUPHENAZINE HCL 5 MG PO TABS: 5.0000 mg | ORAL_TABLET | Freq: Two times a day (BID) | ORAL | Status: DC

## 2023-08-03 NOTE — Plan of Care (Signed)
  Problem: Education: Goal: Knowledge of [facility] [facility] Education information/materials will improve Outcome: Not Progressing Goal: Emotional status will improve Outcome: Not Progressing Goal: Mental status will improve Outcome: Not Progressing   

## 2023-08-03 NOTE — Group Note (Signed)
 Date:  08/03/2023 Time:  10:22 AM  Group Topic/Focus:  Goals Group:   The focus of this group is to help patients establish daily goals to achieve during treatment and discuss how the patient can incorporate goal setting into their daily lives to aide in recovery.    Participation Level:  Active  Participation Quality:  Appropriate  Affect:  Appropriate  Cognitive:  Appropriate  Insight: Appropriate  Engagement in Group:  Engaged  Modes of Intervention:  Discussion, Education, and Support  Additional Comments:    Kelly Carr [name] 08/03/2023, 10:22 AM

## 2023-08-03 NOTE — BH Assessment (Signed)
 Patient denies si,hi,avh. Received prn tylenol and Ibrophen for knee pain. Then was seen walking outside the nurse station reading a book.Patient was needy throughout the night. Patient removed boot. Then had complaints of nausea so crackers were given twice throughout the night. Patient remains safe and contracts for safety. Q15 checks are being performed.
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
## 2023-08-03 NOTE — Group Note (Signed)
 LCSW Group Therapy Note  Group Date: 08/03/2023 Start Time: 1330 End Time: 1430   Type of Therapy and Topic:  Group Therapy: Anger Cues and Responses  Participation Level:  Active   Description of Group:   In this group, patients learned how to recognize the physical, cognitive, emotional, and behavioral responses they have to anger-provoking situations.  They identified a recent time they became angry and how they reacted.  They analyzed how their reaction was possibly beneficial and how it was possibly unhelpful.  The group discussed a variety of healthier coping skills that could help with such a situation in the future.  Focus was placed on how helpful it is to recognize the underlying emotions to our anger, because working on those can lead to a more permanent solution as well as our ability to focus on the important rather than the urgent.  Therapeutic Goals: Patients will remember their last incident of anger and how they felt emotionally and physically, what their thoughts were at the time, and how they behaved. Patients will identify how their behavior at that time worked for them, as well as how it worked against them. Patients will explore possible new behaviors to use in future anger situations. Patients will learn that anger itself is normal and cannot be eliminated, and that healthier reactions can assist with resolving conflict rather than worsening situations.  Summary of Patient Progress:   Patient was active during the group. She shared a recent occurrence wherein feeling angry led to negative consequences.  She was able to discuss her triggers and warning signs to anger.  She was appropriate in group, though tangential in speech. However, pt was able to come back to her main point.  Behavior in group was improved that previous group.  Insight continues to improve.   Therapeutic Modalities:   Cognitive Behavioral Therapy    Harden Mo, LCSW 08/03/2023  3:16 PM

## 2023-08-03 NOTE — Group Note (Signed)
 Date:  08/03/2023 Time:  9:57 PM  Group Topic/Focus:  Wrap-Up Group:   The focus of this group is to help patients review their daily goal of treatment and discuss progress on daily workbooks.    Participation Level:  Active  Participation Quality:  Appropriate, Monopolizing, and Sharing  Affect:  Defensive  Cognitive:  Disorganized  Insight: Limited  Engagement in Group:  Engaged and Monopolizing  Modes of Intervention:  Discussion  Additional Comments:     Belva Crome 08/03/2023, 9:57 PM

## 2023-08-03 NOTE — Progress Notes (Signed)
 Patient is involuntarily admitted to [facility] for Bipolar with psychotic features.  Patient is intrusive, multiple prn's given for chronic pain in her knee.  Had episodes of crying for her vicodin being discontinued. Took all of her meds with no problem.  Had verbal disagreements with 2 of her peers. Has an ortho boot for her foot but refused to wear it today. Participated in group.  Denies SI, HI, AVH, endorses anxiety.  Will continue to monitor.

## 2023-08-03 NOTE — Group Note (Signed)
 Recreation Therapy Group Note   Group Topic:Health and Wellness  Group Date: 08/03/2023 Start Time: 1515 End Time: 1615 Facilitators: Rosina Lowenstein, LRT, CTRS Location: Courtyard  Group Description: [employer]. LRT and patients played games of basketball, drew with chalk, and played corn hole while outside in the courtyard while getting fresh air and sunlight. Music was being played in the background. LRT and peers conversed about different games they have played before, what they do in their free time and anything else that is on their minds. LRT encouraged pts to drink water after being outside, sweating and getting their heart rate up.  Goal Area(s) Addressed: Patient will build on frustration tolerance skills. Patients will partake in a competitive play game with peers. Patients will gain knowledge of new leisure interest/hobby.    Affect/Mood: Elevated   Participation Level: Hyperverbal   Participation Quality: Independent   Behavior: Alert   Speech/Thought Process: Flight of ideas   Insight: Limited   Judgement: Fair    Modes of Intervention: Activity   Patient Response to Interventions:  Receptive   Education Outcome:  In group clarification offered    Clinical Observations/Individualized Feedback: Kelly Carr was active in their participation of session activities and group discussion.Pt interacted well with LRT and peers duration of session.     Plan: Continue to engage patient in RT group sessions 2-3x/week.   Rosina Lowenstein, LRT, CTRS 08/03/2023 5:12 PM

## 2023-08-03 NOTE — Group Note (Signed)
 Recreation Therapy Group Note   Group Topic:Healthy Support Systems  Group Date: 08/03/2023 Start Time: 1000 End Time: 1110 Facilitators: Rosina Lowenstein, LRT, CTRS Location:  Craft Room  Group Description: Straw Bridge. In groups or individually, patients were given 10 plastic drinking straws and an equal length of masking tape. Using the materials provided, patients were instructed to build a free-standing bridge-like structure to suspend an everyday item (ex: deck of cards) off the floor or table surface. All materials were required to be used in Secondary school teacher. LRT facilitated post-activity discussion reviewing the importance of having strong and healthy support systems in our lives. LRT discussed how the people in our lives serve as the tape and the deck of cards we placed on top of our straw structure are the stressors we face in daily life. LRT and pts discussed what happens in our life when things get too heavy for [place], and we don't have strong supports outside of the hospital. Pt shared 2 of their healthy supports in their life aloud in the group.   Goal Area(s) Addressed:  Patient will identify 2 healthy supports in their life. Patient will identify skills to successfully complete activity. Patient will identify correlation of this activity to life post-discharge.  Patient will build on frustration tolerance skills. Patient will increase team building and communication skills.    Affect/Mood: Full range and Labile   Participation Level: Minimal    Clinical Observations/Individualized Feedback: Dalaina was in and our of group. Pt was present at the start of group and started crying saying that she needed to leave group. Pt returned calm and collected. Minutes later pt started crying again, asking what time it was and shared that she couldn't get any medicine until 11am. Minutes later pt was calm again and was laughing, while helping peers with their straw structure.   Plan: Continue to  engage patient in RT group sessions 2-3x/week.   Rosina Lowenstein, LRT, CTRS 08/03/2023 11:38 AM

## 2023-08-04 MED ORDER — LORATADINE 10 MG PO TABS
10.0000 mg | ORAL_TABLET | Freq: Every day | ORAL | Status: DC
Start: 2023-08-04 — End: 2023-08-08
  Administered 2023-08-04 – 2023-08-08 (×5): 10 mg via ORAL
  Filled 2023-08-04 (×5): qty 1

## 2023-08-04 NOTE — Group Note (Signed)
 Date:  08/04/2023 Time:  8:42 PM  Group Topic/Focus:  Spirituality:   The focus of this group is to discuss how one's spirituality can aide in recovery.    Participation Level:  Active  Participation Quality:  Appropriate and Attentive  Affect:  Appropriate  Cognitive:  Alert and Appropriate  Insight: Appropriate and Good  Engagement in Group:  Engaged and Improving  Modes of Intervention:  Clarification, Discussion, Orientation, Rapport Building, and Support  Additional Comments:     Makaylen Thieme 08/04/2023, 8:42 PM
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
## 2023-08-04 NOTE — Group Note (Signed)
 Recreation Therapy Group Note   Group Topic:Other  Group Date: 08/04/2023 Start Time: 1500 End Time: 1550 Facilitators: Rosina Lowenstein, LRT, CTRS Location: Courtyard  Group Description: [employer]. LRT and patients played games of basketball, drew with chalk, and played corn hole while outside in the courtyard while getting fresh air and sunlight. Music was being played in the background. LRT and peers conversed about different games they have played before, what they do in their free time and anything else that is on their minds. LRT encouraged pts to drink water after being outside, sweating and getting their heart rate up.  Goal Area(s) Addressed: Patient will build on frustration tolerance skills. Patients will partake in a competitive play game with peers. Patients will gain knowledge of new leisure interest/hobby.    Affect/Mood: Elevated   Participation Level: Hyperverbal   Participation Quality: Independent   Behavior: Cooperative   Speech/Thought Process: Flight of ideas   Insight: Limited   Judgement: Fair    Modes of Intervention: Activity   Patient Response to Interventions:  Receptive   Education Outcome:  Acknowledges education   Clinical Observations/Individualized Feedback: Kelly Carr was active in their participation of session activities and group discussion. Pt was hyper-verbal throughout session. Overall, pt was pleasant and interacted well with LRT and peers.    Plan: Continue to engage patient in RT group sessions 2-3x/week.   Rosina Lowenstein, LRT, CTRS 08/04/2023 4:48 PM

## 2023-08-04 NOTE — Group Note (Signed)
 Date:  08/04/2023 Time:  5:52 PM  Group Topic/Focus:  Building Self Esteem:   The Focus of this group is helping patients become aware of the effects of self-esteem on their lives, the things they and others do that enhance or undermine their self-esteem, seeing the relationship between their level of self-esteem and the choices they make and learning ways to enhance self-esteem. Coping With Mental Health Crisis:   The purpose of this group is to help patients identify strategies for coping with mental health crisis.  Group discusses possible causes of crisis and ways to manage them effectively.     Participation Level:  Active  Participation Quality:  Appropriate  Affect:  Appropriate  Cognitive:  Appropriate  Insight: Improving  Engagement in Group:  Improving  Modes of Intervention:  Activity and Socialization  Additional Comments:    Kelly Carr L Kelly Carr 08/04/2023, 5:52 PM

## 2023-08-04 NOTE — Group Note (Signed)
 Recreation Therapy Group Note   Group Topic:Leisure Education  Group Date: 08/04/2023 Start Time: 1000 End Time: 1100 Facilitators: Rosina Lowenstein, LRT, CTRS Location:  Craft Room  Group Description: Leisure. Patients were given the option to choose from singing karaoke, coloring mandalas, using oil pastels, journaling, or playing with play-doh. LRT and pts discussed the meaning of leisure, the importance of participating in leisure during their free time/when they're outside of the hospital, as well as how our leisure interests can also serve as coping skills.   Goal Area(s) Addressed:  Patient will identify a current leisure interest.  Patient will learn the definition of "leisure". Patient will practice making a positive decision. Patient will have the opportunity to try a new leisure activity. Patient will communicate with peers and LRT.    Affect/Mood: Full range   Participation Level: Minimal    Clinical Observations/Individualized Feedback: Kelly Carr was in and out of group multiple times. Pt has rapid speech and intermittent crying spells.   Plan: Continue to engage patient in RT group sessions 2-3x/week.   Rosina Lowenstein, LRT, CTRS 08/04/2023 12:20 PM
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
## 2023-08-05 LAB — LITHIUM LEVEL: Lithium Lvl: 0.65 mmol/L (ref 0.60–1.20)

## 2023-08-05 MED ORDER — DOCUSATE SODIUM 100 MG PO CAPS
100.0000 mg | ORAL_CAPSULE | Freq: Two times a day (BID) | ORAL | Status: DC
  Administered 2023-08-07: 100 mg via ORAL
  Filled 2023-08-05 (×3): qty 1

## 2023-08-05 MED ORDER — MAGNESIUM CITRATE PO SOLN
1.0000 | Freq: Once | ORAL | Status: AC
  Administered 2023-08-05: 1 via ORAL
  Filled 2023-08-05: qty 296

## 2023-08-05 MED ORDER — FLUPHENAZINE HCL 5 MG PO TABS
7.5000 mg | ORAL_TABLET | Freq: Two times a day (BID) | ORAL | Status: DC
  Administered 2023-08-05 – 2023-08-06 (×3): 7.5 mg via ORAL
  Filled 2023-08-05 (×4): qty 2

## 2023-08-05 MED ORDER — LITHIUM CARBONATE ER 300 MG PO TBCR
600.0000 mg | EXTENDED_RELEASE_TABLET | Freq: Two times a day (BID) | ORAL | Status: DC
  Administered 2023-08-05 – 2023-08-08 (×6): 600 mg via ORAL
  Filled 2023-08-05 (×6): qty 2

## 2023-08-05 NOTE — Group Note (Signed)
 Date:  08/05/2023 Time:  10:47 AM  Group Topic/Focus:  Music Therapy: The purpose of this group is to allow patients to enjoy soothing music of their choice.    Participation Level:  Active  Participation Quality:  Appropriate  Affect:  Appropriate  Cognitive:  Alert and Appropriate  Insight: Appropriate  Engagement in Group:  Engaged  Modes of Intervention:  Activity  Additional Comments:    Marta Antu 08/05/2023, 10:47 AM

## 2023-08-05 NOTE — Progress Notes (Signed)
 [facility] MD Progress Note  08/05/2023 10:00 AM Kelly Carr  MRN:  [id number]  53 year old Caucasian female with reported hx of bipolar disorder, polysubstance abuse, brought in via EMS from a local motel after being taken in by [facility] PD and subsequently evaluated at [facility]. UDS was positive for TCA, BDZ, THC. Reportedly had been using a friend's BDZ. Upon presentation patient presented with sxs of mania, grandiose delusions that she was about to get married to a celebrity, has an album coming out soon, pressured speech, flight of ideas.    Subjective:  Pt is seen fro reassessment, all vitals and staff notes reviewed.  No behavioral issues reported overnight. Continues with psychosis, sxs of mania. She has grandiose and paranoid delusions, perseveration, labile mood, intrusive behaviors.  Speech continues to be rapid, flight of ideas, pressured, tangential. States her daughter visited her last evening and brought her some clothes. She is complaining of constipation, left knee and right ankle pain. She's been administered as needed for constipation, however she states that she had to manually removed feces this morning.  She is requesting something else to help with constipation.  A couple days ago patient actually was complaining of diarrhea. Reports difficulties sleeping overnight. Nursing staff confirms she was up most of the night. Appetite good. She is denying anxiety, SI/HI and AVH. No observed RTIS. Alert and oriented x4. Affect is irritable, dysphoric. Insight and judgement impaired. Med compliant, attends groups.    Principal Problem: Bipolar disorder, current episode manic severe with psychotic features (HCC) Diagnosis: Principal Problem:   Bipolar disorder, current episode manic severe with psychotic features (HCC) Active Problems:   PTSD (post-traumatic stress disorder)   Cannabis abuse  Total Time spent with patient: 30 minutes  Past Psychiatric History: see H&P  Past Medical History:   Past Medical History:  Diagnosis Date   Abnormal Pap smear    Unknown results>colpo>normal   Abscess 10/14/2021   Anxiety    Arthritis    Asthma    Bipolar 1 disorder (HCC)    Cervical strain 05/04/2021   Depression    Forearm strain, left, initial encounter 05/04/2021   Labral tear of hip joint 12/27/2014   Orbital floor (blow-out) closed fracture (HCC) 10/17/2018   Orbital floor (blow-out) closed fracture (HCC) 10/17/2018   Paresthesia and pain of extremity 04/07/2021   PTSD (post-traumatic stress disorder)    Rotator cuff tendinitis, right 05/04/2021    Past Surgical History:  Procedure Laterality Date   NO PAST SURGERIES     Family History:  Family History  Problem Relation Age of Onset   Diabetes Mother    Hypertension Mother    Heart disease Mother 55   Schizophrenia Mother    Diabetes Maternal Grandmother    Heart disease Maternal Grandmother    Diabetes Maternal Grandfather    Heart disease Maternal Grandfather    Depression Daughter    Bipolar disorder Cousin    Bipolar disorder Nephew    Family Psychiatric  History:  see h&P Social History:  Social History   Substance and Sexual Activity  Alcohol Use Not Currently     Social History   Substance and Sexual Activity  Drug Use Not Currently   Types: Marijuana   Comment: just a few few times since LOV 10/13/2022    Social History   Socioeconomic History   Marital status: Single    Spouse name: Not on file   Number of children: Not on file   Years of education: Not on  file   Highest education level: Not on file  Occupational History   Not on file  Tobacco Use   Smoking status: Former    Passive exposure: Past   Smokeless tobacco: Never  Vaping Use   Vaping status: Every Day   Substances: Nicotine, Flavoring  Substance and Sexual Activity   Alcohol use: Not Currently   Drug use: Not Currently    Types: Marijuana    Comment: just a few few times since LOV 10/13/2022   Sexual activity: Yes     Partners: Male    Birth control/protection: None  Other Topics Concern   Not on file  Social History Narrative   Not on file   Social Drivers of Health   Financial Resource Strain: Not on file  Food Insecurity: No Food Insecurity (07/30/2023)   Hunger Vital Sign    Worried About Running Out of Food in the Last Year: Never true    Ran Out of Food in the Last Year: Never true  Transportation Needs: No Transportation Needs (07/30/2023)   PRAPARE - Transportation    Lack of Transportation (Medical): No    Lack of Transportation (Non-Medical): No  Recent Concern: Transportation Needs - Unmet Transportation Needs (07/19/2023)   PRAPARE - Administrator, Civil Service (Medical): Yes    Lack of Transportation (Non-Medical): Yes  Physical Activity: Not on file  Stress: Not on file  Social Connections: Moderately Isolated (07/19/2023)   Social Connection and Isolation Panel [NHANES]    Frequency of Communication with Friends and Family: More than three times a week    Frequency of Social Gatherings with Friends and Family: Once a week    Attends Religious Services: Never    Database administrator or Organizations: No    Attends Engineer, structural: Never    Marital Status: Living with partner   Additional Social History:                         Sleep: Good  Appetite:  Good  Current Medications: Current Facility-Administered Medications  Medication Dose Route Frequency Provider Last Rate Last Admin   acetaminophen (TYLENOL) tablet 650 mg  650 mg Oral Q6H PRN Penn, Cranston Neighbor, NP   650 mg at 08/05/23 0817   albuterol (PROVENTIL) (2.5 MG/3ML) 0.083% nebulizer solution 2.5 mg  2.5 mg Inhalation Q6H PRN Myriam Forehand, NP       alum & mag hydroxide-simeth (MAALOX/MYLANTA) 200-200-20 MG/5ML suspension 15 mL  15 mL Oral Q4H PRN Penn, Cranston Neighbor, NP   15 mL at 08/01/23 1037   diclofenac Sodium (VOLTAREN) 1 % topical gel 2 g  2 g Topical QID Myriam Forehand, NP   2 g at  08/05/23 8413   fluPHENAZine (PROLIXIN) tablet 7.5 mg  7.5 mg Oral BID Verner Chol, MD       hydrocortisone cream 1 %   Topical BID PRN Danaye Sobh, PA-C       hydrOXYzine (ATARAX) tablet 50 mg  50 mg Oral Q8H PRN Myriam Forehand, NP   50 mg at 08/01/23 0604   ibuprofen (ADVIL) tablet 800 mg  800 mg Oral Q8H PRN Myriam Forehand, NP   800 mg at 08/05/23 0258   lithium carbonate (ESKALITH) ER tablet 450 mg  450 mg Oral Q12H Lanasia Porras, PA-C   450 mg at 08/05/23 2440   loperamide (IMODIUM) capsule 2 mg  2 mg Oral PRN Anavictoria Wilk, Judeth Cornfield, PA-C  2 mg at 07/31/23 1231   loratadine (CLARITIN) tablet 10 mg  10 mg Oral Daily Ziad Maye, PA-C   10 mg at 08/05/23 0820   magnesium hydroxide (MILK OF MAGNESIA) suspension 15 mL  15 mL Oral Daily PRN Mcneil Sober, NP   15 mL at 08/04/23 2347   melatonin tablet 5 mg  5 mg Oral QHS Myriam Forehand, NP   5 mg at 08/04/23 2145   nicotine (NICODERM CQ - dosed in mg/24 hours) patch 14 mg  14 mg Transdermal Daily Myriam Forehand, NP   14 mg at 08/04/23 1027   nicotine polacrilex (NICORETTE) gum 2 mg  2 mg Oral PRN Sarina Ill, DO   2 mg at 08/05/23 0823   OLANZapine (ZYPREXA) injection 10 mg  10 mg Intramuscular TID PRN Mcneil Sober, NP       OLANZapine (ZYPREXA) injection 5 mg  5 mg Intramuscular TID PRN Mcneil Sober, NP       OLANZapine zydis (ZYPREXA) disintegrating tablet 5 mg  5 mg Oral TID PRN Mcneil Sober, NP   5 mg at 08/03/23 2305   ondansetron (ZOFRAN-ODT) disintegrating tablet 4 mg  4 mg Oral Q12H Dixon, Rashaun M, NP   4 mg at 08/05/23 2536   prazosin (MINIPRESS) capsule 1 mg  1 mg Oral QHS Meleny Tregoning, PA-C   1 mg at 08/04/23 2146   traZODone (DESYREL) tablet 50 mg  50 mg Oral QHS [name], NP   50 mg at 08/04/23 2147    Lab Results:  No results found for this or any previous visit (from the past 48 hours).   Blood Alcohol level:  Lab Results  Component Value Date   ETH <10 07/29/2023   ETH <10  07/11/2023    Metabolic Disorder Labs: Lab Results  Component Value Date   HGBA1C 5.5 07/11/2023   MPG 111.15 07/11/2023   MPG 111.15 02/04/2021   Lab Results  Component Value Date   PROLACTIN 39.5 (H) 04/12/2018   PROLACTIN 2.0 12/05/2011   Lab Results  Component Value Date   CHOL 184 08/01/2023   TRIG 153 (H) 08/01/2023   HDL 38 (L) 08/01/2023   CHOLHDL 4.8 08/01/2023   VLDL 31 08/01/2023   LDLCALC 115 (H) 08/01/2023   LDLCALC 109 (H) 09/05/2022    Physical Findings: AIMS: Facial and Oral Movements Muscles of Facial Expression: None Lips and Perioral Area: None Jaw: None Tongue: None,Extremity Movements Upper (arms, wrists, hands, fingers): None, Trunk Movements Neck, shoulders, hips: None, Global Judgements Severity of abnormal movements overall : None Incapacitation due to abnormal movements: None Patient's awareness of abnormal movements: No Awareness, Dental Status Current problems with teeth and/or dentures?: No Does patient usually wear dentures?: No  CIWA:    COWS:     Musculoskeletal: Strength & Muscle Tone:  orthopedic boot on R. Foot injury  Gait & Station: unsteady, ambulating with orthopedic boot on r. Foot  Patient leans: N/A  Psychiatric Specialty Exam:  Presentation  General Appearance:  Appropriate for Environment  Eye Contact: Good  Speech: Clear and Coherent  Speech Volume: Increased  Handedness: Right   Mood and Affect  Mood: Anxious  Affect: Appropriate   Thought Process  Thought Processes: Goal Directed  Descriptions of Associations:Tangential  Orientation:Full (Time, Place and Person)  Thought Content:Perseveration; Logical  History of Schizophrenia/Schizoaffective disorder:No  Duration of Psychotic Symptoms:Greater than six months  Hallucinations:No data recorded  Ideas of Reference:None  Suicidal Thoughts:No data recorded  Homicidal Thoughts:No data recorded   Sensorium  Memory: Immediate  Good; Recent Good; Remote Good  Judgment: Fair  Insight: Fair   Chartered certified accountant: Fair  Attention Span: Fair  Recall: Good  Fund of Knowledge: Good  Language: Good   Psychomotor Activity  Psychomotor Activity: No data recorded   Assets  Assets: Desire for Improvement; Communication Skills   Sleep  Sleep: Reports good sleep overnight    Physical Exam: Physical Exam Vitals and nursing note reviewed.  Constitutional:      Appearance: Normal appearance.  HENT:     Head: Normocephalic.  Eyes:     Pupils: Pupils are equal, round, and reactive to light.  Pulmonary:     Effort: Pulmonary effort is normal.  Musculoskeletal:     Cervical back: Normal range of motion.     Comments: Ambulating with orthopedic boot on right foot, secondary to injury   Skin:    General: Skin is warm and dry.     Comments: Blisters on soles of feet   Neurological:     General: No focal deficit present.     Mental Status: She is alert and oriented to person, place, and time.  Psychiatric:        Attention and Perception: Perception normal. She is inattentive.        Mood and Affect: Mood normal. Affect is labile and angry.        Speech: Speech is rapid and pressured and tangential.        Behavior: Behavior is agitated.        Thought Content: Thought content is paranoid and delusional.        Cognition and Memory: Cognition normal.        Judgment: Judgment is impulsive.     Comments: Insight and judgement impaired    Review of Systems  Musculoskeletal:        Right foot injury, ambulating with orthopedic boot   Skin:        Blisters on soles of feet   Psychiatric/Behavioral:         Delusions   All other systems reviewed and are negative.  Blood pressure (!) 144/95, pulse (!) 117, temperature (!) 97 F (36.1 C), resp. rate 20, height 5\' 4"  (1.626 m), weight 76.3 kg, SpO2 98%. Body mass index is 28.87 kg/m.   Treatment Plan Summary:  1.     Safety and Monitoring:   -- Involuntary admission to inpatient psychiatric unit for safety, stabilization and treatment -- Daily contact with patient to assess and evaluate symptoms and progress in treatment -- Patient's case to be discussed in multi-disciplinary team meeting -- Observation Level : q15 minute checks -- Vital signs:  q12 hours -- Precautions: suicide   2. Psychiatric Diagnoses and Treatment:  08/05/2023 -- Continue Prolixin 7.5 mg BID for bipolar disorder/psychosis, will continue to uptitrate as needed (it seems she may not have been administered first dose as ordered last evening) -- Increase Lithium to 600 BID for bipolar/mood stabilization (lithium level is 0.65) -- Continue Prazosin 1 mg HS for nightmares  -- One-time dose of magnesium citrate for her constipation.  Will schedule docusate sodium 100 mg twice daily for constipation. -- Continue trazodone 50 mg and melatonin 5 mg at bedtime for sleep  08/04/2023 -- Continue Prolixin, will be increased to 7.5 mg BID this evening for bipolar disorder/psychosis, will continue to uptitrate as needed -- Last dose of Abilify 5 mg was this AM -- Continue  Lithium to 450 BID for bipolar/mood stabilization, plan is to increase tomorrow after lithium levels result -- Continue Prazosin 1 mg HS for nightmares  -- Seasonal allergies add Claritin 10 mg QAM  -- Will d/c Abilify Maintena 300 mg IM next dose due 08/09/2023 (has failed this, lack of efficacy) -- Lithium level ordered for tomorrow (3/1) AM    08/03/2023 -- Start Prolixin 5 mg BID today, and uptitrate to 7.5 mg BID starting tomorrow evening for bipolar disorder/psychosis -- Down titrate and discontinue Abilify 10mg  PO at bedtime for mood stabilization/psychosis. Reduce to 5 mg tomorrow, for last dose -- Will d/c Abilify Maintena 300 mg IM next dose due 08/09/2023 (has failed this, lack of efficacy) -- Continue Lithium to 450 BID for bipolar/mood stabilization -- Continue  Prazosin 1 mg HS for nightmares  -- Klonopin taper completed  08/02/2023 -- Increase Lithium to 450 BID for bipolar/mood stabilization -- Continue Prazosin 1 mg HS for nightmares  -- Continue Abilify 10mg  PO at bedtime for mood stabilization/psychosis. Plan to down titrate and switch to another antipsychotic d/t poor efficacy, continued delusions.  -- Abilify Maintena 300 mg IM next dose sue 08/09/2023  -- Continue Klonopin taper  08/01/2023 -- Initiate Prazosin 1 mg HS for nightmares -- Continue other meds as below  07/31/2023  -- Continue Lithium 300 mg BID for bipolar d/o/acute mania   -- Continue Abilify 10mg  PO at bedtime for mood stabilization/psychosis   -- Abilify Maintena 300 mg IM next dose sue 08/09/2023  -- Continue Klonopin taper  --  The risks/benefits/side-effects/alternatives to this medication were discussed in detail with the patient and time was given for questions. The patient consents to medication trial.  -- Metabolic profile and EKG monitoring obtained while on an atypical antipsychotic  (BMI: 28.87; Lipid Panel: LDL 115, TG 153 both mildly elevated;  HbgA1c: 5.5 on 07/11/23)  -- Encouraged patient to participate in unit milieu and in scheduled group therapies  -- Short Term Goals: Ability to identify changes in lifestyle to reduce recurrence of condition will improve, Ability to verbalize feelings will improve, Ability to disclose and discuss suicidal ideas, Ability to demonstrate self-control will improve, Ability to identify and develop effective coping behaviors will improve, Ability to maintain clinical measurements within normal limits will improve, Compliance with prescribed medications will improve, and Ability to identify triggers associated with substance abuse/mental health issues will improve -- Long Term Goals: Improvement in symptoms so as ready for discharge        3. Medical Issues Being Addressed:   Seasonal Allergies   -- Start Claritin 10 mg QAM      R. LE injury              -- Voltaren gel QID to the right knee as directed for local inflammation.             -- Orthopedic boot for right foot to support mobility and protect against further injury. -- 08/02/2023 Vicodin (1 tablet as needed over 24 hours) as needed for pain discontinued.  -- Motrin 800?mg every 8 hours as needed, with careful monitoring given her heightened sensitivity and history of musculoskeletal complaints.              Respiratory disease   -- Continue Proventil 2.5 mg Q6H PRN    Diarrhea/Hemorrhoids (resolved 2/27)  -- Start Imodium 2 mg PRN for loose stools  -- Preparation H topical ointment/cream BID PRN  Tobacco Use Disorder             -- Nicotine gum 2mg  PRN             -- Smoking cessation encouraged   4. Discharge Planning:   -- Social work and case management to assist with discharge planning and identification of hospital follow-up needs prior to discharge -- Estimated LOS: 5-7 days -- Discharge Concerns: Need to establish a safety plan; Medication compliance and effectiveness -- Discharge Goals: Return home with outpatient referrals for mental health follow-up including medication management/psychotherapy   Maigen Mozingo, PA-C 08/05/2023, 10:00 AM

## 2023-08-05 NOTE — Plan of Care (Signed)
  Problem: Education: Goal: Emotional status will improve Outcome: Progressing   Problem: Education: Goal: Knowledge of [facility] Education information/materials will improve Outcome: Progressing   Problem: Education: Goal: Verbalization of understanding the information provided will improve Outcome: Progressing   Problem: Activity: Goal: Interest or engagement in activities will improve Outcome: Progressing

## 2023-08-05 NOTE — Progress Notes (Signed)
   08/04/23 2000  Psych Admission Type (Psych Patients Only)  Admission Status Involuntary  Psychosocial Assessment  Patient Complaints Restlessness;Hyperactivity;Nervousness;Anxiety  Eye Contact Brief;Suspiciousness  Facial Expression Anxious;Flat  Affect Anxious  Speech Tangential;Argumentative  Interaction Intrusive;Dominating;Demanding;Needy  Motor Activity Slow  Appearance/Hygiene Improved  Behavior Characteristics Anxious;Guarded  Mood Labile  Thought Process  Coherency WDL  Content WDL  Delusions None reported or observed  Perception WDL  Hallucination None reported or observed  Judgment WDL  Confusion WDL  Danger to Self  Current suicidal ideation? Denies  Danger to Others  Danger to Others None reported or observed   Patient is alert and oriented x 4, affect is flat but brightens upon approach, thoughts are disorganized, speech is tangential, she is intrusive, restless and demanding with multiple task for the staff every time she approached the nursing station. Patient was complaint with medication regimen, she was redirected to her room multiple times when she appeared restless.15, minutes safety checks maintained will continue to monitor.

## 2023-08-05 NOTE — Group Note (Signed)
 Date:  08/05/2023 Time:  5:19 PM  Group Topic/Focus:  Activity Group: The focus of the group is to promote activity for the patients and encourage them to go outside to the courtyard to receive some exercise and some fresh air.    Participation Level:  Active  Participation Quality:  Appropriate  Affect:  Appropriate  Cognitive:  Appropriate  Insight: Appropriate  Engagement in Group:  Engaged  Modes of Intervention:  Activity  Additional Comments:    Mary Sella Aser Nylund 08/05/2023, 5:19 PM

## 2023-08-05 NOTE — BH IP Treatment Plan (Signed)
 Interdisciplinary Treatment and Diagnostic Plan Update  08/05/2023 Time of Session: 9:32 am [place] MRN: [id number]  Principal Diagnosis: Bipolar disorder, current episode manic severe with psychotic features [facility])  Secondary Diagnoses: Principal Problem:   Bipolar disorder, current episode manic severe with psychotic features (HCC) Active Problems:   PTSD (post-traumatic stress disorder)   Cannabis abuse   Current Medications:  Current Facility-Administered Medications  Medication Dose Route Frequency Provider Last Rate Last Admin   acetaminophen (TYLENOL) tablet 650 mg  650 mg Oral Q6H PRN Penn, Cranston Neighbor, NP   650 mg at 08/05/23 0817   albuterol (PROVENTIL) (2.5 MG/3ML) 0.083% nebulizer solution 2.5 mg  2.5 mg Inhalation Q6H PRN Myriam Forehand, NP       alum & mag hydroxide-simeth (MAALOX/MYLANTA) 200-200-20 MG/5ML suspension 15 mL  15 mL Oral Q4H PRN Penn, Cranston Neighbor, NP   15 mL at 08/01/23 1037   diclofenac Sodium (VOLTAREN) 1 % topical gel 2 g  2 g Topical QID Myriam Forehand, NP   2 g at 08/05/23 4098   fluPHENAZine (PROLIXIN) tablet 7.5 mg  7.5 mg Oral BID Verner Chol, MD       hydrocortisone cream 1 %   Topical BID PRN Tingling, Stephanie, PA-C       hydrOXYzine (ATARAX) tablet 50 mg  50 mg Oral Q8H PRN Myriam Forehand, NP   50 mg at 08/01/23 0604   ibuprofen (ADVIL) tablet 800 mg  800 mg Oral Q8H PRN Myriam Forehand, NP   800 mg at 08/05/23 0258   lithium carbonate (ESKALITH) ER tablet 450 mg  450 mg Oral Q12H Tingling, Stephanie, PA-C   450 mg at 08/05/23 1191   loperamide (IMODIUM) capsule 2 mg  2 mg Oral PRN Tingling, Stephanie, PA-C   2 mg at 07/31/23 1231   loratadine (CLARITIN) tablet 10 mg  10 mg Oral Daily Tingling, Stephanie, PA-C   10 mg at 08/05/23 0820   magnesium hydroxide (MILK OF MAGNESIA) suspension 15 mL  15 mL Oral Daily PRN Mcneil Sober, NP   15 mL at 08/04/23 2347   melatonin tablet 5 mg  5 mg Oral QHS Myriam Forehand, NP   5 mg at 08/04/23 2145   nicotine  (NICODERM CQ - dosed in mg/24 hours) patch 14 mg  14 mg Transdermal Daily Myriam Forehand, NP   14 mg at 08/04/23 4782   nicotine polacrilex (NICORETTE) gum 2 mg  2 mg Oral PRN Sarina Ill, DO   2 mg at 08/05/23 0823   OLANZapine (ZYPREXA) injection 10 mg  10 mg Intramuscular TID PRN Mcneil Sober, NP       OLANZapine (ZYPREXA) injection 5 mg  5 mg Intramuscular TID PRN Mcneil Sober, NP       OLANZapine zydis (ZYPREXA) disintegrating tablet 5 mg  5 mg Oral TID PRN Mcneil Sober, NP   5 mg at 08/03/23 2305   ondansetron (ZOFRAN-ODT) disintegrating tablet 4 mg  4 mg Oral Q12H Dixon, Rashaun M, NP   4 mg at 08/05/23 9562   prazosin (MINIPRESS) capsule 1 mg  1 mg Oral QHS Tingling, Stephanie, PA-C   1 mg at 08/04/23 2146   traZODone (DESYREL) tablet 50 mg  50 mg Oral QHS [name], NP   50 mg at 08/04/23 2147   PTA Medications: Medications Prior to Admission  Medication Sig Dispense Refill Last Dose/Taking   albuterol (VENTOLIN HFA) 108 (90 Base) MCG/ACT inhaler Inhale 1-2 puffs into the lungs  every 6 (six) hours as needed for wheezing or shortness of breath. 18 g 2    ARIPiprazole (ABILIFY) 20 MG tablet Take 1 tablet (20 mg total) by mouth daily. 30 tablet 0    diclofenac Sodium (VOLTAREN) 1 % GEL Apply 2 g topically 4 (four) times daily as needed. 150 g 0    hydrOXYzine (ATARAX) 25 MG tablet Take 1 tablet (25 mg total) by mouth 3 (three) times daily as needed. 30 tablet 0    lithium 300 MG tablet Take 300 mg by mouth daily.      nicotine (NICODERM CQ - DOSED IN MG/24 HOURS) 14 mg/24hr patch Place 1 patch (14 mg total) onto the skin daily. 28 patch 0     Patient Stressors: Health problems   Marital or family conflict    Patient Strengths: Average or above average intelligence  Communication skills   Treatment Modalities: Medication Management, Group therapy, Case management,  1 to 1 session with clinician, Psychoeducation, Recreational therapy.   Physician Treatment Plan for  Primary Diagnosis: Bipolar disorder, current episode manic severe with psychotic features (HCC) Long Term Goal(s): Improvement in symptoms so as ready for discharge   Short Term Goals: Ability to identify changes in lifestyle to reduce recurrence of condition will improve Ability to verbalize feelings will improve Ability to disclose and discuss suicidal ideas Ability to demonstrate self-control will improve Ability to identify and develop effective coping behaviors will improve Ability to maintain clinical measurements within normal limits will improve Compliance with prescribed medications will improve Ability to identify triggers associated with substance abuse/mental health issues will improve  Medication Management: Evaluate patient's response, side effects, and tolerance of medication regimen.  Therapeutic Interventions: 1 to 1 sessions, Unit Group sessions and Medication administration.  Evaluation of Outcomes: Not Progressing  Physician Treatment Plan for Secondary Diagnosis: Principal Problem:   Bipolar disorder, current episode manic severe with psychotic features (HCC) Active Problems:   PTSD (post-traumatic stress disorder)   Cannabis abuse  Long Term Goal(s): Improvement in symptoms so as ready for discharge   Short Term Goals: Ability to identify changes in lifestyle to reduce recurrence of condition will improve Ability to verbalize feelings will improve Ability to disclose and discuss suicidal ideas Ability to demonstrate self-control will improve Ability to identify and develop effective coping behaviors will improve Ability to maintain clinical measurements within normal limits will improve Compliance with prescribed medications will improve Ability to identify triggers associated with substance abuse/mental health issues will improve     Medication Management: Evaluate patient's response, side effects, and tolerance of medication regimen.  Therapeutic  Interventions: 1 to 1 sessions, Unit Group sessions and Medication administration.  Evaluation of Outcomes: Not Progressing   RN Treatment Plan for Primary Diagnosis: Bipolar disorder, current episode manic severe with psychotic features (HCC) Long Term Goal(s): Knowledge of disease and therapeutic regimen to maintain health will improve  Short Term Goals: Ability to remain free from injury will improve, Ability to verbalize frustration and anger appropriately will improve, Ability to demonstrate self-control, Ability to participate in decision making will improve, Ability to verbalize feelings will improve, and Compliance with prescribed medications will improve  Medication Management: RN will administer medications as ordered by provider, will assess and evaluate patient's response and provide education to patient for prescribed medication. RN will report any adverse and/or side effects to prescribing provider.  Therapeutic Interventions: 1 on 1 counseling sessions, Psychoeducation, Medication administration, Evaluate responses to treatment, Monitor vital signs and CBGs as ordered,  Perform/monitor CIWA, COWS, AIMS and Fall Risk screenings as ordered, Perform wound care treatments as ordered.  Evaluation of Outcomes: Not Progressing   LCSW Treatment Plan for Primary Diagnosis: Bipolar disorder, current episode manic severe with psychotic features (HCC) Long Term Goal(s): Safe transition to appropriate next level of care at discharge, Engage patient in therapeutic group addressing interpersonal concerns.  Short Term Goals: Engage patient in aftercare planning with referrals and resources, Increase social support, Increase ability to appropriately verbalize feelings, Increase emotional regulation, Facilitate acceptance of mental health diagnosis and concerns, and Increase skills for wellness and recovery  Therapeutic Interventions: Assess for all discharge needs, 1 to 1 time with Social worker,  Explore available resources and support systems, Assess for adequacy in community support network, Educate family and significant other(s) on suicide prevention, Complete Psychosocial Assessment, Interpersonal group therapy.  Evaluation of Outcomes: Not Progressing   Progress in Treatment: Attending groups: Yes. Participating in groups: Yes. Taking medication as prescribed: Yes. Toleration medication: Yes. Family/Significant other contact made: No, will contact:  once permission is given  Patient understands diagnosis: Yes. Discussing patient identified problems/goals with staff: Yes. Medical problems stabilized or resolved: Yes. Denies suicidal/homicidal ideation: Yes. Issues/concerns per patient self-inventory: No. Other: None  New problem(s) identified: No, Describe:  none  Update 08/06/2023: No changes at this time.   New Short Term/Long Term Goal(s): detox, elimination of symptoms of psychosis, medication management for mood stabilization; elimination of SI thoughts; development of comprehensive mental wellness/sobriety plan.   Update 08/06/2023: No changes at this time.   Patient Goals:  "get the right medication regiment and take them"   Update 08/06/2023: No changes at this time.   Discharge Plan or Barriers: 1-7 days  Update 08/06/2023: No changes at this time.   Reason for Continuation of Hospitalization: Anxiety Delusions  Depression Mania Medication stabilization Suicidal ideation   Estimated Length of Stay: Update 08/06/2023: TBD  Last 3 [place] Suicide Severity Risk Score: Flowsheet Row Admission (Current) from 07/29/2023 in [facility] INPAT[facility]INE Most recent reading at 07/30/2023  6:00 AM ED from 07/29/2023 in [facility] Department [facility] Most recent reading at 07/29/2023  2:48 AM ED from 07/27/2023 in [facility] Emergency Department at [facility] Most recent reading at 07/27/2023  6:35 AM  C-SSRS RISK CATEGORY No Risk No  Risk No Risk       Last PHQ 2/9 Scores:    07/07/2023    9:52 AM 06/26/2023    1:52 PM 06/22/2023   11:37 AM  Depression screen PHQ 2/9  Decreased Interest 0 0 0  Down, Depressed, Hopeless 0 0 0  PHQ - 2 Score 0 0 0  Altered sleeping 0 0 0  Tired, decreased energy 0 0 0  Change in appetite 0 0 0  Feeling bad or failure about yourself  0 0 0  Trouble concentrating 0 0 0  Moving slowly or fidgety/restless 0 0 0  Suicidal thoughts 0 0 0  PHQ-9 Score 0 0 0  Difficult doing work/chores   Not difficult at all    Scribe for Treatment Team: Marshell Levan, LCSW 08/05/2023 9:32 AM

## 2023-08-05 NOTE — Group Note (Signed)
 Date:  08/05/2023 Time:  10:34 PM  Group Topic/Focus:  Self Care:   The focus of this group is to help patients understand the importance of self-care in order to improve or restore emotional, physical, spiritual, interpersonal, and financial health.    Participation Level:  Active  Participation Quality:  Appropriate  Affect:  Appropriate  Cognitive:  Appropriate  Insight: Appropriate  Engagement in Group:  Engaged  Modes of Intervention:  Activity  Additional Comments:    Josephanthony Tindel 08/05/2023, 10:34 PM

## 2023-08-05 NOTE — Progress Notes (Signed)
 Patient is an involuntary commitment to BMU for Bipolar - mania. Patient is intrusive, fixated on her constipation today.  Medication ordered mag citrate.  Half the bottle given with the other half on standby incase she still needs it.  Patient did have a bowel movement yesterday but she states she did a digital extraction and was still constipated. Patient's prolixin changed by pharmacy and was received late.  Holding evening one til 7pm.  Denies SI, HI, AVH, anxiety and depression.  Denies being manic and states she is going to sue everyone.  Patient did not sleep today nor did she sleep well last night.  Will continue to monitor.

## 2023-08-06 MED ORDER — FLUPHENAZINE HCL 5 MG PO TABS
10.0000 mg | ORAL_TABLET | Freq: Two times a day (BID) | ORAL | Status: DC
  Administered 2023-08-06 – 2023-08-08 (×5): 10 mg via ORAL
  Filled 2023-08-06 (×5): qty 2

## 2023-08-06 NOTE — Group Note (Signed)
 Date:  08/06/2023 Time:  9:00 PM  Group Topic/Focus:  Wrap-Up Group:   The focus of this group is to help patients review their daily goal of treatment and discuss progress on daily workbooks.    Participation Level:  Active  Participation Quality:  Appropriate  Affect:  Appropriate  Cognitive:  Appropriate  Insight: Appropriate  Engagement in Group:  Engaged  Modes of Intervention:  Discussion   Lenore Cordia 08/06/2023, 9:00 PM

## 2023-08-06 NOTE — Progress Notes (Signed)
   08/05/23 2000  Psych Admission Type (Psych Patients Only)  Admission Status Involuntary  Psychosocial Assessment  Patient Complaints Anxiety;Restlessness  Eye Contact Brief;Suspiciousness  Facial Expression Anxious;Flat  Affect Anxious  Speech Tangential  Interaction Childlike;Demanding;Attention-seeking  Motor Activity Slow  Appearance/Hygiene Improved  Behavior Characteristics Anxious;Restless  Mood Preoccupied;Labile  Thought Process  Coherency WDL  Content WDL  Delusions None reported or observed  Perception WDL  Hallucination None reported or observed  Judgment WDL  Confusion WDL  Danger to Self  Current suicidal ideation? Denies  Danger to Others  Danger to Others None reported or observed   Patient alert and oriented x 4, she appears less anxious, less intrusive interacting appropriately with peers and staff. 15, minutes safety checks maintained.

## 2023-08-06 NOTE — Group Note (Signed)
 Date:  08/06/2023 Time:  12:10 PM  Group Topic/Focus:  Coping With Mental Health Crisis:   The purpose of this group is to help patients identify strategies for coping with mental health crisis.  Group discusses possible causes of crisis and ways to manage them effectively.    Participation Level:  Active  Participation Quality:  Appropriate  Affect:  Appropriate  Cognitive:  Appropriate  Insight: Appropriate  Engagement in Group:  Engaged  Modes of Intervention:  Activity  Additional Comments:    Mary Sella James Lafalce 08/06/2023, 12:10 PM

## 2023-08-06 NOTE — Progress Notes (Signed)
Pt calm and pleasant during assessment denying SI/HI/AVH. Pt endorses anxiety and depression. Pt observed by this Clinical research associate interacting appropriately with staff and peers on the unit. Pt compliant with medication administration per MD orders. Pt given education, support, and encouragement to be active in her treatment plan. Pt being monitored Q 15 minutes for safety per unit protocol, remains safe on the unit

## 2023-08-06 NOTE — Plan of Care (Signed)
  Problem: Education: Goal: Mental status will improve Outcome: Progressing   Problem: Education: Goal: Emotional status will improve Outcome: Progressing   Problem: Education: Goal: Knowledge of [facility] Education information/materials will improve Outcome: Progressing

## 2023-08-06 NOTE — Progress Notes (Signed)
 [facility] MD Progress Note  08/06/2023 12:03 PM Kelly Carr  MRN:  [id number]  53 year old Caucasian female with reported hx of bipolar disorder, polysubstance abuse, brought in via EMS from a local motel after being taken in by [facility] and subsequently evaluated at [facility]. UDS was positive for TCA, BDZ, THC. Reportedly had been using a friend's BDZ. Upon presentation patient presented with sxs of mania, grandiose delusions that she was about to get married to a celebrity, has an album coming out soon, pressured speech, flight of ideas.    Subjective:  Pt is seen fro reassessment, all vitals and staff notes reviewed.  No behavioral issues reported overnight.  Patient continues with symptoms of mania, psychosis.  Some mild improvement today during reassessment, does not appear to be as labile, speech is less pressured, able to redirect, continues to be hypertalkative with flight of ideas, perseveration.  Affect appears to be much calmer today.  States that she is doing okay, that she slept 6 hours overnight which was good for her.  Eating without difficulties, constipation and diarrhea have resolved.  Patient goes off on a tangent, speaking about her daughter, her mother in nursing home, having tided her room today, journaling.  Continues to believe that she is going to be marrying a celebrity in May, and her album will be coming out then.  She is apologetic today about her mood lability.  Denies any medication side effects.  Denies SI/HI and AVH.  Per nurse's report, patient only slept about 4 to 6 hours after administered as needed Zyprexa, she was pacing in the unit, reported that she was bleeding out of the rectum, none observed, hypertalkative, elevated mood.  Principal Problem: Bipolar disorder, current episode manic severe with psychotic features (HCC) Diagnosis: Principal Problem:   Bipolar disorder, current episode manic severe with psychotic features (HCC) Active Problems:   PTSD (post-traumatic  stress disorder)   Cannabis abuse  Total Time spent with patient: 30 minutes  Past Psychiatric History: see H&P  Past Medical History:  Past Medical History:  Diagnosis Date   Abnormal Pap smear    Unknown results>colpo>normal   Abscess 10/14/2021   Anxiety    Arthritis    Asthma    Bipolar 1 disorder (HCC)    Cervical strain 05/04/2021   Depression    Forearm strain, left, initial encounter 05/04/2021   Labral tear of hip joint 12/27/2014   Orbital floor (blow-out) closed fracture (HCC) 10/17/2018   Orbital floor (blow-out) closed fracture (HCC) 10/17/2018   Paresthesia and pain of extremity 04/07/2021   PTSD (post-traumatic stress disorder)    Rotator cuff tendinitis, right 05/04/2021    Past Surgical History:  Procedure Laterality Date   NO PAST SURGERIES     Family History:  Family History  Problem Relation Age of Onset   Diabetes Mother    Hypertension Mother    Heart disease Mother 62   Schizophrenia Mother    Diabetes Maternal Grandmother    Heart disease Maternal Grandmother    Diabetes Maternal Grandfather    Heart disease Maternal Grandfather    Depression Daughter    Bipolar disorder Cousin    Bipolar disorder Nephew    Family Psychiatric  History:  see h&P Social History:  Social History   Substance and Sexual Activity  Alcohol Use Not Currently     Social History   Substance and Sexual Activity  Drug Use Not Currently   Types: Marijuana   Comment: just a few few times  since LOV 10/13/2022    Social History   Socioeconomic History   Marital status: Single    Spouse name: Not on file   Number of children: Not on file   Years of education: Not on file   Highest education level: Not on file  Occupational History   Not on file  Tobacco Use   Smoking status: Former    Passive exposure: Past   Smokeless tobacco: Never  Vaping Use   Vaping status: Every Day   Substances: Nicotine, Flavoring  Substance and Sexual Activity   Alcohol use:  Not Currently   Drug use: Not Currently    Types: Marijuana    Comment: just a few few times since LOV 10/13/2022   Sexual activity: Yes    Partners: Male    Birth control/protection: None  Other Topics Concern   Not on file  Social History Narrative   Not on file   Social Drivers of Health   Financial Resource Strain: Not on file  Food Insecurity: No Food Insecurity (07/30/2023)   Hunger Vital Sign    Worried About Running Out of Food in the Last Year: Never true    Ran Out of Food in the Last Year: Never true  Transportation Needs: No Transportation Needs (07/30/2023)   PRAPARE - Transportation    Lack of Transportation (Medical): No    Lack of Transportation (Non-Medical): No  Recent Concern: Transportation Needs - Unmet Transportation Needs (07/19/2023)   PRAPARE - Administrator, Civil Service (Medical): Yes    Lack of Transportation (Non-Medical): Yes  Physical Activity: Not on file  Stress: Not on file  Social Connections: Moderately Isolated (07/19/2023)   Social Connection and Isolation Panel [NHANES]    Frequency of Communication with Friends and Family: More than three times a week    Frequency of Social Gatherings with Friends and Family: Once a week    Attends Religious Services: Never    Database administrator or Organizations: No    Attends Engineer, structural: Never    Marital Status: Living with partner   Additional Social History:                         Sleep: Good  Appetite:  Good  Current Medications: Current Facility-Administered Medications  Medication Dose Route Frequency Provider Last Rate Last Admin   acetaminophen (TYLENOL) tablet 650 mg  650 mg Oral Q6H PRN Penn, Cranston Neighbor, NP   650 mg at 08/05/23 1701   albuterol (PROVENTIL) (2.5 MG/3ML) 0.083% nebulizer solution 2.5 mg  2.5 mg Inhalation Q6H PRN Myriam Forehand, NP       alum & mag hydroxide-simeth (MAALOX/MYLANTA) 200-200-20 MG/5ML suspension 15 mL  15 mL Oral Q4H  PRN Penn, Cranston Neighbor, NP   15 mL at 08/01/23 1037   diclofenac Sodium (VOLTAREN) 1 % topical gel 2 g  2 g Topical QID Myriam Forehand, NP   2 g at 08/06/23 0846   docusate sodium (COLACE) capsule 100 mg  100 mg Oral BID Laroy Mustard, PA-C       fluPHENAZine (PROLIXIN) tablet 7.5 mg  7.5 mg Oral BID Verner Chol, MD   7.5 mg at 08/06/23 0845   hydrocortisone cream 1 %   Topical BID PRN Brynnan Rodenbaugh, PA-C       hydrOXYzine (ATARAX) tablet 50 mg  50 mg Oral Q8H PRN Myriam Forehand, NP   50 mg at 08/01/23 [phone]  ibuprofen (ADVIL) tablet 800 mg  800 mg Oral Q8H PRN Myriam Forehand, NP   800 mg at 08/06/23 0425   lithium carbonate (LITHOBID) ER tablet 600 mg  600 mg Oral Q12H Keyaira Clapham, PA-C   600 mg at 08/06/23 0845   loperamide (IMODIUM) capsule 2 mg  2 mg Oral PRN Amyri Frenz, PA-C   2 mg at 07/31/23 1231   loratadine (CLARITIN) tablet 10 mg  10 mg Oral Daily Leonard Feigel, PA-C   10 mg at 08/06/23 0845   magnesium hydroxide (MILK OF MAGNESIA) suspension 15 mL  15 mL Oral Daily PRN Mcneil Sober, NP   15 mL at 08/04/23 2347   melatonin tablet 5 mg  5 mg Oral QHS Myriam Forehand, NP   5 mg at 08/05/23 2132   nicotine (NICODERM CQ - dosed in mg/24 hours) patch 14 mg  14 mg Transdermal Daily Myriam Forehand, NP   14 mg at 08/06/23 6578   nicotine polacrilex (NICORETTE) gum 2 mg  2 mg Oral PRN Sarina Ill, DO   2 mg at 08/06/23 0849   OLANZapine (ZYPREXA) injection 10 mg  10 mg Intramuscular TID PRN Mcneil Sober, NP       OLANZapine (ZYPREXA) injection 5 mg  5 mg Intramuscular TID PRN Mcneil Sober, NP       OLANZapine zydis (ZYPREXA) disintegrating tablet 5 mg  5 mg Oral TID PRN Mcneil Sober, NP   5 mg at 08/05/23 2132   ondansetron (ZOFRAN-ODT) disintegrating tablet 4 mg  4 mg Oral Q12H [name], NP   4 mg at 08/05/23 4696   prazosin (MINIPRESS) capsule 1 mg  1 mg Oral QHS Johnel Yielding, PA-C   1 mg at 08/05/23 2132   traZODone (DESYREL) tablet 50 mg   50 mg Oral QHS [name], NP   50 mg at 08/05/23 2132    Lab Results:  Results for orders placed or performed during the hospital encounter of 07/29/23 (from the past 48 hours)  Lithium level     Status: None   Collection Time: 08/05/23  7:21 AM  Result Value Ref Range   Lithium Lvl 0.65 0.60 - 1.20 mmol/L    Comment: Performed at [facility], [address] [place], [place] [address]     Blood Alcohol level:  Lab Results  Component Value Date   [facility] <10 07/29/2023   ETH <10 07/11/2023    Metabolic Disorder Labs: Lab Results  Component Value Date   HGBA1C 5.5 07/11/2023   MPG 111.15 07/11/2023   MPG 111.15 02/04/2021   Lab Results  Component Value Date   PROLACTIN 39.5 (H) 04/12/2018   PROLACTIN 2.0 12/05/2011   Lab Results  Component Value Date   CHOL 184 08/01/2023   TRIG 153 (H) 08/01/2023   HDL 38 (L) 08/01/2023   CHOLHDL 4.8 08/01/2023   VLDL 31 08/01/2023   LDLCALC 115 (H) 08/01/2023   LDLCALC 109 (H) 09/05/2022    Physical Findings: AIMS: Facial and Oral Movements Muscles of Facial Expression: None Lips and Perioral Area: None Jaw: None Tongue: None,Extremity Movements Upper (arms, wrists, hands, fingers): None, Trunk Movements Neck, shoulders, hips: None, Global Judgements Severity of abnormal movements overall : None Incapacitation due to abnormal movements: None Patient's awareness of abnormal movements: No Awareness, Dental Status Current problems with teeth and/or dentures?: No Does patient usually wear dentures?: No  CIWA:    COWS:     Musculoskeletal: Strength &  Muscle Tone:  orthopedic boot on R. Foot injury  Gait & Station: unsteady, ambulating with orthopedic boot on r. Foot  Patient leans: N/A  Psychiatric Specialty Exam:  Presentation  General Appearance:  Appropriate for Environment  Eye Contact: Good  Speech: Clear and Coherent  Speech Volume: Increased  Handedness: Right   Mood and Affect   Mood: Anxious  Affect: Appropriate   Thought Process  Thought Processes: Goal Directed  Descriptions of Associations:Tangential  Orientation:Full (Time, Place and Person)  Thought Content:Perseveration; Logical  History of Schizophrenia/Schizoaffective disorder:No  Duration of Psychotic Symptoms:Greater than six months  Hallucinations:No data recorded  Ideas of Reference:None  Suicidal Thoughts:No data recorded  Homicidal Thoughts:No data recorded   Sensorium  Memory: Immediate Good; Recent Good; Remote Good  Judgment: Fair  Insight: Fair   Art therapist  Concentration: Fair  Attention Span: Fair  Recall: Good  Fund of Knowledge: Good  Language: Good   Psychomotor Activity  Psychomotor Activity: No data recorded   Assets  Assets: Desire for Improvement; Communication Skills   Sleep  Sleep: Reports good sleep overnight    Physical Exam: Physical Exam Vitals and nursing note reviewed.  Constitutional:      Appearance: Normal appearance.  HENT:     Head: Normocephalic.  Eyes:     Pupils: Pupils are equal, round, and reactive to light.  Pulmonary:     Effort: Pulmonary effort is normal.  Musculoskeletal:     Cervical back: Normal range of motion.     Comments: Ambulating with orthopedic boot on right foot, secondary to injury   Skin:    General: Skin is warm and dry.     Comments: Blisters on soles of feet   Neurological:     General: No focal deficit present.     Mental Status: She is alert and oriented to person, place, and time.  Psychiatric:        Attention and Perception: Perception normal. She is inattentive.        Mood and Affect: Mood normal.        Speech: Speech is rapid and pressured and tangential.        Behavior: Behavior is agitated.        Thought Content: Thought content is paranoid and delusional.        Cognition and Memory: Cognition normal.        Judgment: Judgment is impulsive.      Comments: Insight and judgement impaired    Review of Systems  Musculoskeletal:        Right foot injury, ambulating with orthopedic boot   Skin:        Blisters on soles of feet   Psychiatric/Behavioral:         Delusions   All other systems reviewed and are negative.  Blood pressure 131/76, pulse 95, temperature 97.9 F (36.6 C), resp. rate 18, height 5\' 4"  (1.626 m), weight 76.3 kg, SpO2 98%. Body mass index is 28.87 kg/m.   Treatment Plan Summary:  1.    Safety and Monitoring:   -- Involuntary admission to inpatient psychiatric unit for safety, stabilization and treatment -- Daily contact with patient to assess and evaluate symptoms and progress in treatment -- Patient's case to be discussed in multi-disciplinary team meeting -- Observation Level : q15 minute checks -- Vital signs:  q12 hours -- Precautions: suicide   2. Psychiatric Diagnoses and Treatment:  08/06/2023 -- Increase Prolixin to 10 mg BID for bipolar disorder/psychosis, will continue  to uptitrate as needed to 10 TID. May need to consider, dual antipsychotic therapy if she fails Prolixin, minimal improvement in sxs -- Continue Lithium to 600 BID for bipolar/mood stabilization (lithium level is 0.65) -- Continue Prazosin 1 mg HS for nightmares  -- Continue docusate sodium 100 mg twice daily for constipation. -- Continue trazodone 50 mg and melatonin 5 mg at bedtime for sleep  08/05/2023 -- Continue Prolixin 7.5 mg BID for bipolar disorder/psychosis, will continue to uptitrate as needed (it seems she may not have been administered first dose as ordered last evening) -- Increase Lithium to 600 BID for bipolar/mood stabilization (lithium level is 0.65) -- Continue Prazosin 1 mg HS for nightmares  -- One-time dose of magnesium citrate for her constipation.  Will schedule docusate sodium 100 mg twice daily for constipation. -- Continue trazodone 50 mg and melatonin 5 mg at bedtime for sleep  08/04/2023 -- Continue  Prolixin, will be increased to 7.5 mg BID this evening for bipolar disorder/psychosis, will continue to uptitrate as needed -- Last dose of Abilify 5 mg was this AM -- Continue Lithium to 450 BID for bipolar/mood stabilization, plan is to increase tomorrow after lithium levels result -- Continue Prazosin 1 mg HS for nightmares  -- Seasonal allergies add Claritin 10 mg QAM  -- Will d/c Abilify Maintena 300 mg IM next dose due 08/09/2023 (has failed this, lack of efficacy) -- Lithium level ordered for tomorrow (3/1) AM    08/03/2023 -- Start Prolixin 5 mg BID today, and uptitrate to 7.5 mg BID starting tomorrow evening for bipolar disorder/psychosis -- Down titrate and discontinue Abilify 10mg  PO at bedtime for mood stabilization/psychosis. Reduce to 5 mg tomorrow, for last dose -- Will d/c Abilify Maintena 300 mg IM next dose due 08/09/2023 (has failed this, lack of efficacy) -- Continue Lithium to 450 BID for bipolar/mood stabilization -- Continue Prazosin 1 mg HS for nightmares  -- Klonopin taper completed  08/02/2023 -- Increase Lithium to 450 BID for bipolar/mood stabilization -- Continue Prazosin 1 mg HS for nightmares  -- Continue Abilify 10mg  PO at bedtime for mood stabilization/psychosis. Plan to down titrate and switch to another antipsychotic d/t poor efficacy, continued delusions.  -- Abilify Maintena 300 mg IM next dose sue 08/09/2023  -- Continue Klonopin taper  08/01/2023 -- Initiate Prazosin 1 mg HS for nightmares -- Continue other meds as below  07/31/2023  -- Continue Lithium 300 mg BID for bipolar d/o/acute mania   -- Continue Abilify 10mg  PO at bedtime for mood stabilization/psychosis   -- Abilify Maintena 300 mg IM next dose sue 08/09/2023  -- Continue Klonopin taper  --  The risks/benefits/side-effects/alternatives to this medication were discussed in detail with the patient and time was given for questions. The patient consents to medication trial.  -- Metabolic  profile and EKG monitoring obtained while on an atypical antipsychotic  (BMI: 28.87; Lipid Panel: LDL 115, TG 153 both mildly elevated;  HbgA1c: 5.5 on 07/11/23)  -- Encouraged patient to participate in unit milieu and in scheduled group therapies  -- Short Term Goals: Ability to identify changes in lifestyle to reduce recurrence of condition will improve, Ability to verbalize feelings will improve, Ability to disclose and discuss suicidal ideas, Ability to demonstrate self-control will improve, Ability to identify and develop effective coping behaviors will improve, Ability to maintain clinical measurements within normal limits will improve, Compliance with prescribed medications will improve, and Ability to identify triggers associated with substance abuse/mental health issues will improve --  Long Term Goals: Improvement in symptoms so as ready for discharge        3. Medical Issues Being Addressed:   Seasonal Allergies   -- Start Claritin 10 mg QAM     R. LE injury              -- Voltaren gel QID to the right knee as directed for local inflammation.             -- Orthopedic boot for right foot to support mobility and protect against further injury. -- 08/02/2023 Vicodin (1 tablet as needed over 24 hours) as needed for pain discontinued.  -- Motrin 800?mg every 8 hours as needed, with careful monitoring given her heightened sensitivity and history of musculoskeletal complaints.              Respiratory disease   -- Continue Proventil 2.5 mg Q6H PRN    Diarrhea/Hemorrhoids (resolved 2/27)  -- Start Imodium 2 mg PRN for loose stools  -- Preparation H topical ointment/cream BID PRN                Tobacco Use Disorder             -- Nicotine gum 2mg  PRN             -- Smoking cessation encouraged   4. Discharge Planning:   -- Social work and case management to assist with discharge planning and identification of hospital follow-up needs prior to discharge -- Estimated LOS: 5-7 days --  Discharge Concerns: Need to establish a safety plan; Medication compliance and effectiveness -- Discharge Goals: Return home with outpatient referrals for mental health follow-up including medication management/psychotherapy   Paulene Floor, PA-C 08/06/2023, 12:03 PM

## 2023-08-06 NOTE — Plan of Care (Signed)
  Problem: Activity: Goal: Interest or engagement in activities will improve Outcome: Progressing   Problem: Education: Goal: Emotional status will improve Outcome: Not Progressing Goal: Mental status will improve Outcome: Not Progressing    08/06/23 1000  Psych Admission Type (Psych Patients Only)  Admission Status Involuntary  Psychosocial Assessment  Patient Complaints Anxiety;Restlessness  Eye Contact Brief  Facial Expression Anxious  Affect Anxious  Speech Rapid  Interaction Assertive  Motor Activity Slow  Appearance/Hygiene Unremarkable  Behavior Characteristics Anxious;Pacing  Mood Preoccupied  Thought Process  Coherency WDL  Content WDL  Delusions None reported or observed  Perception WDL  Hallucination None reported or observed  Judgment WDL  Confusion WDL  Danger to Self  Current suicidal ideation? Denies  Agreement Not to Harm Self Yes  Description of Agreement verbal

## 2023-08-07 ENCOUNTER — Ambulatory Visit: Payer: Self-pay | Admitting: Student

## 2023-08-07 DIAGNOSIS — F312 Bipolar disorder, current episode manic severe with psychotic features: Principal | ICD-10-CM

## 2023-08-07 MED ORDER — MELATONIN 5 MG PO TABS
10.0000 mg | ORAL_TABLET | Freq: Every day | ORAL | Status: DC
Start: 2023-08-07 — End: 2023-08-08
  Administered 2023-08-07: 10 mg via ORAL
  Filled 2023-08-07: qty 2

## 2023-08-07 NOTE — Group Note (Signed)
 Date:  08/07/2023 Time:  11:36 AM  Group Topic/Focus:  Goals Group:   The focus of this group is to help patients establish daily goals to achieve during treatment and discuss how the patient can incorporate goal setting into their daily lives to aide in recovery.  Participation Level:  Active  Participation Quality:  Appropriate  Affect:  Appropriate  Cognitive:  Appropriate  Insight: Appropriate  Engagement in Group:  Engaged  Modes of Intervention:  Discussion and Education  Additional Comments:    Klea Nall A Airrion Otting 08/07/2023, 11:36 AM
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
## 2023-08-07 NOTE — Group Note (Signed)
 Date:  08/07/2023 Time:  8:49 PM  Group Topic/Focus:  Wrap-Up Group:   The focus of this group is to help patients review their daily goal of treatment and discuss progress on daily workbooks.    Participation Level:  Active  Participation Quality:  Appropriate  Affect:  Appropriate  Cognitive:  Appropriate  Insight: Appropriate  Engagement in Group:  Supportive  Modes of Intervention:  Discussion  Additional Comments:     Belva Crome 08/07/2023, 8:49 PM

## 2023-08-07 NOTE — Progress Notes (Addendum)
   08/07/23 0900  Psych Admission Type (Psych Patients Only)  Admission Status Involuntary  Psychosocial Assessment  Patient Complaints Other (Comment) (Not getting any medical help.)  Eye Contact Fair  Facial Expression Other (Comment) (WNL)  Affect Appropriate to circumstance  Speech Logical/coherent  Interaction Assertive  Motor Activity Slow  Appearance/Hygiene Unremarkable  Behavior Characteristics Cooperative;Appropriate to situation  Mood Labile  Thought Process  Coherency WDL  Content WDL  Delusions None reported or observed  Perception WDL  Hallucination None reported or observed  Judgment Impaired  Confusion WDL  Danger to Self  Current suicidal ideation? Denies  Danger to Others  Danger to Others None reported or observed   Patient less intrusive today. No irritable behaviors noted. Looking forward for discharge tomorrow. Support and encouragement given.

## 2023-08-07 NOTE — Group Note (Signed)
 Recreation Therapy Group Note   Group Topic:Goal Setting  Group Date: 08/07/2023 Start Time: 1040 End Time: 1140 Facilitators: Rosina Lowenstein, LRT, CTRS Location:  Craft Room  Group Description: Product/process development scientist. Patients were given many different magazines, a glue stick, markers, and a piece of cardstock paper. LRT and pts discussed the importance of having goals in life. LRT and pts discussed the difference between short-term and long-term goals, as well as what a SMART goal is. LRT encouraged pts to create a vision board, with images they picked and then cut out with safety scissors from the magazine, for themselves, that capture their short and long-term goals. LRT encouraged pts to show and explain their vision board to the group.   Goal Area(s) Addressed:  Patient will gain knowledge of short vs. long term goals.  Patient will identify goals for themselves. Patient will practice setting SMART goals. Patient will verbalize their goals to LRT and peers.   Affect/Mood: Appropriate   Participation Level: Active and Engaged   Participation Quality: Independent   Behavior: Calm and Cooperative   Speech/Thought Process: Loose association   Insight: Fair   Judgement: Fair    Modes of Intervention: Art, Education, Exploration, Guided Discussion, Socialization, and Support   Patient Response to Interventions:  Attentive, Engaged, Interested , and Receptive   Education Outcome:  Acknowledges education   Clinical Observations/Individualized Feedback: Kelly Carr was active in their participation of session activities and group discussion. 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 Pt appropriately identified images to reflect these goals. Pt interacted well with LRT and peers duration of session.    Plan: Continue to engage patient in RT group sessions  2-3x/week.   Rosina Lowenstein, LRT, CTRS 08/07/2023 1:20 PM

## 2023-08-07 NOTE — Plan of Care (Signed)
  Problem: Education: Goal: Emotional status will improve Outcome: Progressing Goal: Mental status will improve Outcome: Progressing   Problem: Coping: Goal: Ability to verbalize frustrations and anger appropriately will improve Outcome: Progressing   Problem: Safety: Goal: Periods of time without injury will increase Outcome: Progressing

## 2023-08-07 NOTE — Progress Notes (Signed)
 [facility] MD Progress Note  08/07/2023 5:42 PM Stephannie Barney  MRN:  [id number] Subjective:  53 year old Caucasian female,presents with an urgent desire to leave, stating, "I need to go home. I am getting married." She reports having a 50B (protective order) that she needs to take care of and expresses concern about her upcoming wedding and album release next month. The patient states, "I need to live. I will stay in a shelter." She also reports frustration, stating, "No ortho has come to see me," while ambulating with a right walking boot. Principal Problem: Bipolar disorder, current episode manic severe with psychotic features (HCC) Diagnosis: Principal Problem:   Bipolar disorder, current episode manic severe with psychotic features (HCC) Active Problems:   PTSD (post-traumatic stress disorder)   Cannabis abuse  Total Time spent with patient: 2.5 hours  Past Psychiatric History: see below  Past Medical History:  Past Medical History:  Diagnosis Date   Abnormal Pap smear    Unknown results>colpo>normal   Abscess 10/14/2021   Anxiety    Arthritis    Asthma    Bipolar 1 disorder (HCC)    Cervical strain 05/04/2021   Depression    Forearm strain, left, initial encounter 05/04/2021   Labral tear of hip joint 12/27/2014   Orbital floor (blow-out) closed fracture (HCC) 10/17/2018   Orbital floor (blow-out) closed fracture (HCC) 10/17/2018   Paresthesia and pain of extremity 04/07/2021   PTSD (post-traumatic stress disorder)    Rotator cuff tendinitis, right 05/04/2021    Past Surgical History:  Procedure Laterality Date   NO PAST SURGERIES     Family History:  Family History  Problem Relation Age of Onset   Diabetes Mother    Hypertension Mother    Heart disease Mother 74   Schizophrenia Mother    Diabetes Maternal Grandmother    Heart disease Maternal Grandmother    Diabetes Maternal Grandfather    Heart disease Maternal Grandfather    Depression Daughter    Bipolar disorder  Cousin    Bipolar disorder Nephew    Family Psychiatric  History: none reported Social History:  Social History   Substance and Sexual Activity  Alcohol Use Not Currently     Social History   Substance and Sexual Activity  Drug Use Not Currently   Types: Marijuana   Comment: just a few few times since LOV 10/13/2022    Social History   Socioeconomic History   Marital status: Single    Spouse name: Not on file   Number of children: Not on file   Years of education: Not on file   Highest education level: Not on file  Occupational History   Not on file  Tobacco Use   Smoking status: Former    Passive exposure: Past   Smokeless tobacco: Never  Vaping Use   Vaping status: Every Day   Substances: Nicotine, Flavoring  Substance and Sexual Activity   Alcohol use: Not Currently   Drug use: Not Currently    Types: Marijuana    Comment: just a few few times since LOV 10/13/2022   Sexual activity: Yes    Partners: Male    Birth control/protection: None  Other Topics Concern   Not on file  Social History Narrative   Not on file   Social Drivers of Health   Financial Resource Strain: Not on file  Food Insecurity: No Food Insecurity (07/30/2023)   Hunger Vital Sign    Worried About Running Out of Food in the Last Year:  Never true    Ran Out of Food in the Last Year: Never true  Transportation Needs: No Transportation Needs (07/30/2023)   PRAPARE - Administrator, Civil Service (Medical): No    Lack of Transportation (Non-Medical): No  Recent Concern: Transportation Needs - Unmet Transportation Needs (07/19/2023)   PRAPARE - Administrator, Civil Service (Medical): Yes    Lack of Transportation (Non-Medical): Yes  Physical Activity: Not on file  Stress: Not on file  Social Connections: Moderately Isolated (07/19/2023)   Social Connection and Isolation Panel [NHANES]    Frequency of Communication with Friends and Family: More than three times a week     Frequency of Social Gatherings with Friends and Family: Once a week    Attends Religious Services: Never    Database administrator or Organizations: No    Attends Engineer, structural: Never    Marital Status: Living with partner   Additional Social History:                         Sleep: Fair  Appetite:  Good  Current Medications: Current Facility-Administered Medications  Medication Dose Route Frequency Provider Last Rate Last Admin   acetaminophen (TYLENOL) tablet 650 mg  650 mg Oral Q6H PRN Penn, Cranston Neighbor, NP   650 mg at 08/07/23 1738   albuterol (PROVENTIL) (2.5 MG/3ML) 0.083% nebulizer solution 2.5 mg  2.5 mg Inhalation Q6H PRN Myriam Forehand, NP       alum & mag hydroxide-simeth (MAALOX/MYLANTA) 200-200-20 MG/5ML suspension 15 mL  15 mL Oral Q4H PRN Penn, Cranston Neighbor, NP   15 mL at 08/01/23 1037   diclofenac Sodium (VOLTAREN) 1 % topical gel 2 g  2 g Topical QID Myriam Forehand, NP   2 g at 08/06/23 0846   docusate sodium (COLACE) capsule 100 mg  100 mg Oral BID Tingling, Stephanie, PA-C   100 mg at 08/07/23 0818   fluPHENAZine (PROLIXIN) tablet 10 mg  10 mg Oral BID Tingling, Stephanie, PA-C   10 mg at 08/07/23 1653   hydrocortisone cream 1 %   Topical BID PRN Tingling, Stephanie, PA-C       hydrOXYzine (ATARAX) tablet 50 mg  50 mg Oral Q8H PRN Myriam Forehand, NP   50 mg at 08/01/23 0604   ibuprofen (ADVIL) tablet 800 mg  800 mg Oral Q8H PRN Myriam Forehand, NP   800 mg at 08/07/23 1345   lithium carbonate (LITHOBID) ER tablet 600 mg  600 mg Oral Q12H Tingling, Stephanie, PA-C   600 mg at 08/07/23 1610   loperamide (IMODIUM) capsule 2 mg  2 mg Oral PRN Tingling, Stephanie, PA-C   2 mg at 07/31/23 1231   loratadine (CLARITIN) tablet 10 mg  10 mg Oral Daily Tingling, Stephanie, PA-C   10 mg at 08/07/23 0817   magnesium hydroxide (MILK OF MAGNESIA) suspension 15 mL  15 mL Oral Daily PRN Penn, Cranston Neighbor, NP   15 mL at 08/04/23 2347   melatonin tablet 5 mg  5 mg Oral QHS Myriam Forehand, NP   5 mg at 08/06/23 2128   nicotine (NICODERM CQ - dosed in mg/24 hours) patch 14 mg  14 mg Transdermal Daily Myriam Forehand, NP   14 mg at 08/07/23 9604   nicotine polacrilex (NICORETTE) gum 2 mg  2 mg Oral PRN Sarina Ill, DO   2 mg at 08/07/23 1319  OLANZapine (ZYPREXA) injection 10 mg  10 mg Intramuscular TID PRN Penn, Cranston Neighbor, NP       OLANZapine (ZYPREXA) injection 5 mg  5 mg Intramuscular TID PRN Mcneil Sober, NP       OLANZapine zydis (ZYPREXA) disintegrating tablet 5 mg  5 mg Oral TID PRN Mcneil Sober, NP   5 mg at 08/05/23 2132   ondansetron (ZOFRAN-ODT) disintegrating tablet 4 mg  4 mg Oral Q12H Dixon, Rashaun M, NP   4 mg at 08/05/23 8295   prazosin (MINIPRESS) capsule 1 mg  1 mg Oral QHS Tingling, Stephanie, PA-C   1 mg at 08/06/23 2128   traZODone (DESYREL) tablet 50 mg  50 mg Oral QHS [name], NP   50 mg at 08/06/23 2127    Lab Results: No results found for this or any previous visit (from the past 48 hours).  Blood Alcohol level:  Lab Results  Component Value Date   ETH <10 07/29/2023   ETH <10 07/11/2023    Metabolic Disorder Labs: Lab Results  Component Value Date   HGBA1C 5.5 07/11/2023   MPG 111.15 07/11/2023   MPG 111.15 02/04/2021   Lab Results  Component Value Date   PROLACTIN 39.5 (H) 04/12/2018   PROLACTIN 2.0 12/05/2011   Lab Results  Component Value Date   CHOL 184 08/01/2023   TRIG 153 (H) 08/01/2023   HDL 38 (L) 08/01/2023   CHOLHDL 4.8 08/01/2023   VLDL 31 08/01/2023   LDLCALC 115 (H) 08/01/2023   LDLCALC 109 (H) 09/05/2022    Physical Findings: AIMS: Facial and Oral Movements Muscles of Facial Expression: None Lips and Perioral Area: None Jaw: None Tongue: None,Extremity Movements Upper (arms, wrists, hands, fingers): None, Trunk Movements Neck, shoulders, hips: None, Global Judgements Severity of abnormal movements overall : None Incapacitation due to abnormal movements: None Patient's awareness of  abnormal movements: No Awareness, Dental Status Current problems with teeth and/or dentures?: No Does patient usually wear dentures?: No  CIWA:    COWS:     Musculoskeletal: Strength & Muscle Tone: within normal limits Gait & Station: normal Patient leans: N/A  Psychiatric Specialty Exam:  Presentation  General Appearance:  Appropriate for Environment; Neat  Eye Contact: Good  Speech: Clear and Coherent; Pressured (Hyperverbal, difficult to redirect)  Speech Volume: Increased  Handedness: Right   Mood and Affect  Mood: Anxious; Euphoric  Affect: Labile (Expansive)   Thought Process  Thought Processes: Disorganized (perseveration on external concerns)  Descriptions of Associations:Tangential  Orientation:Partial  Thought Content:Paranoid Ideation (Preoccupied with wedding, album release, and protective order; grandiose statements noted. No overt delusions reported.)  History of Schizophrenia/Schizoaffective disorder:Yes  Duration of Psychotic Symptoms:Greater than six months  Hallucinations:Hallucinations: None  Ideas of Reference:Delusions  Suicidal Thoughts:Suicidal Thoughts: No SI Passive Intent and/or Plan: -- (denies)  Homicidal Thoughts:Homicidal Thoughts: No   Sensorium  Memory: Immediate Fair; Remote Fair; Recent Fair  Judgment: Fair (prioritizing external stressors over current medical needs.)  Insight: Fair (lacks awareness of the need for hospitalization and medical care.)   Executive Functions  Concentration: Fair  Attention Span: Fair  Recall: Fair  Fund of Knowledge: Good  Language: Good   Psychomotor Activity  Psychomotor Activity:Psychomotor Activity: Normal   Assets  Assets: Communication Skills; Resilience   Sleep  Sleep:Sleep: Fair Number of Hours of Sleep: 2    Physical Exam: Physical Exam Vitals and nursing note reviewed.  Constitutional:      Appearance: Normal appearance.  HENT:  Head: Normocephalic and atraumatic.     Nose: Nose normal.  Abdominal:     General: Abdomen is flat.  Musculoskeletal:        General: Normal range of motion.     Cervical back: Normal range of motion.  Neurological:     General: No focal deficit present.     Mental Status: She is alert. Mental status is at baseline.  Psychiatric:        Attention and Perception: Attention and perception normal.        Mood and Affect: Mood is anxious. Affect is labile.        Speech: Speech is rapid and pressured.        Behavior: Behavior is hyperactive.        Thought Content: Thought content is delusional.        Cognition and Memory: Cognition normal. Memory is impaired.        Judgment: Judgment is impulsive.    Review of Systems  Psychiatric/Behavioral:  The patient is nervous/anxious.    Blood pressure 126/78, pulse 87, temperature (!) 97.3 F (36.3 C), resp. rate 16, height 5\' 4"  (1.626 m), weight 76.3 kg, SpO2 98%. Body mass index is 28.87 kg/m.   Treatment Plan Summary: Daily contact with patient to assess and evaluate symptoms and progress in treatment and Medication management Orthopedic surgeon has been notified and will evaluate the patient. Prolixin (Fluphenazine) to 10 mg BID for management of bipolar disorder/psychosis  Lithium 600 mg BID for mood stabilization. Lithium level currently 0.65--therapeutic but on the lower end; will monitor for clinical response and side effects. Prazosin 1 mg HS for nightmares. Docusate Sodium 100 mg BID for constipation management. Trazodone 50 mg and Melatonin 5 mg HS for sleep. Myriam Forehand, NP 08/07/2023, 5:42 PM

## 2023-08-07 NOTE — Plan of Care (Signed)
°  Problem: Education: Goal: Emotional status will improve Outcome: Progressing   Problem: Education: Goal: Mental status will improve Outcome: Progressing   Problem: Education: Goal: Knowledge of [facility] Education information/materials will improve Outcome: Progressing

## 2023-08-07 NOTE — Group Note (Signed)
 LCSW Group Therapy Note   Group Date: 08/07/2023 Start Time: 1300 End Time: 1418   Type of Therapy and Topic:  Group Therapy: Challenging Core Beliefs  Participation Level:  Active  Description of Group:  Patients were educated about core beliefs and asked to identify one harmful core belief that they have. Patients were asked to explore from where those beliefs originate. Patients were asked to discuss how those beliefs make them feel and the resulting behaviors of those beliefs. They were then be asked if those beliefs are true and, if so, what evidence they have to support them. Lastly, group members were challenged to replace those negative core beliefs with helpful beliefs.   Therapeutic Goals:   1. Patient will identify harmful core beliefs and explore the origins of such beliefs. 2. Patient will identify feelings and behaviors that result from those core beliefs. 3. Patient will discuss whether such beliefs are true. 4.  Patient will replace harmful core beliefs with helpful ones.  Summary of Patient Progress:  Patient actively engaged in processing and exploring how core beliefs are formed and how they impact thoughts, feelings, and behaviors. Patient proved open to input from peers and feedback from CSW. Patient demonstrated proficient insight into the subject matter, was respectful and supportive of peers, and participated throughout the entire session.  Therapeutic Modalities: Cognitive Behavioral Therapy; Solution-Focused Therapy   Lowry Ram, LCSWA 08/07/2023  2:20 PM

## 2023-08-07 NOTE — Plan of Care (Signed)
  Problem: Education: Goal: Emotional status will improve Outcome: Progressing   Problem: Education: Goal: Verbalization of understanding the information provided will improve Outcome: Progressing   Problem: Activity: Goal: Interest or engagement in activities will improve Outcome: Progressing   Problem: Coping: Goal: Ability to verbalize frustrations and anger appropriately will improve Outcome: Progressing   Problem: Health Behavior/Discharge Planning: Goal: Identification of resources available to assist in meeting health care needs will improve Outcome: Progressing

## 2023-08-08 ENCOUNTER — Other Ambulatory Visit: Payer: Self-pay

## 2023-08-08 MED ORDER — LITHIUM CARBONATE ER 300 MG PO TBCR
600.0000 mg | EXTENDED_RELEASE_TABLET | Freq: Two times a day (BID) | ORAL | 0 refills | Status: DC
  Filled 2023-08-08: qty 120, 30d supply, fill #0

## 2023-08-08 MED ORDER — FLUTICASONE PROPIONATE 50 MCG/ACT NA SUSP
2.0000 | Freq: Every day | NASAL | Status: DC
  Administered 2023-08-08: 2 via NASAL
  Filled 2023-08-08: qty 16

## 2023-08-08 MED ORDER — LORATADINE 10 MG PO TABS
10.0000 mg | ORAL_TABLET | Freq: Every day | ORAL | 0 refills | Status: DC
  Filled 2023-08-08: qty 30, 30d supply, fill #0

## 2023-08-08 MED ORDER — MELATONIN 10 MG PO TABS
10.0000 mg | ORAL_TABLET | Freq: Every day | ORAL | 0 refills | Status: AC
  Filled 2023-08-08: qty 30, 30d supply, fill #0

## 2023-08-08 MED ORDER — TRAZODONE HCL 50 MG PO TABS
50.0000 mg | ORAL_TABLET | Freq: Every day | ORAL | 0 refills | Status: DC
  Filled 2023-08-08: qty 30, 30d supply, fill #0

## 2023-08-08 MED ORDER — HYDROXYZINE HCL 50 MG PO TABS
50.0000 mg | ORAL_TABLET | Freq: Three times a day (TID) | ORAL | 0 refills | Status: AC | PRN
  Filled 2023-08-08: qty 30, 10d supply, fill #0

## 2023-08-08 MED ORDER — FLUPHENAZINE HCL 10 MG PO TABS: 10.0000 mg | ORAL_TABLET | Freq: Every day | ORAL | 0 refills | Status: DC

## 2023-08-08 MED ORDER — DOCUSATE SODIUM 100 MG PO CAPS
100.0000 mg | ORAL_CAPSULE | Freq: Every day | ORAL | 0 refills | Status: AC
Start: 2023-08-08 — End: 2023-09-07
  Filled 2023-08-08: qty 30, 30d supply, fill #0

## 2023-08-08 MED ORDER — HYDROCORTISONE 1 % EX CREA
TOPICAL_CREAM | Freq: Two times a day (BID) | CUTANEOUS | 0 refills | Status: AC | PRN
  Filled 2023-08-08: qty 28, 30d supply, fill #0

## 2023-08-08 MED ORDER — ALBUTEROL SULFATE (2.5 MG/3ML) 0.083% IN NEBU
2.5000 mg | INHALATION_SOLUTION | Freq: Four times a day (QID) | RESPIRATORY_TRACT | 1 refills | Status: DC | PRN
  Filled 2023-08-08: qty 75, 7d supply, fill #0

## 2023-08-08 MED ORDER — IBUPROFEN 800 MG PO TABS
800.0000 mg | ORAL_TABLET | Freq: Three times a day (TID) | ORAL | 0 refills | Status: DC | PRN
  Filled 2023-08-08: qty 30, 10d supply, fill #0

## 2023-08-08 MED ORDER — FLUPHENAZINE HCL 10 MG PO TABS
10.0000 mg | ORAL_TABLET | Freq: Every day | ORAL | 0 refills | Status: DC
  Filled 2023-08-08: qty 30, 30d supply, fill #0

## 2023-08-08 MED ORDER — NICOTINE POLACRILEX 2 MG MT GUM
2.0000 mg | CHEWING_GUM | OROMUCOSAL | 0 refills | Status: AC | PRN
  Filled 2023-08-08: qty 100, 30d supply, fill #0

## 2023-08-08 MED ORDER — DICLOFENAC SODIUM 1 % EX GEL
2.0000 g | Freq: Four times a day (QID) | CUTANEOUS | 0 refills | Status: AC
  Filled 2023-08-08: qty 100, 12d supply, fill #0

## 2023-08-08 MED ORDER — FLUTICASONE PROPIONATE 50 MCG/ACT NA SUSP
2.0000 | Freq: Every day | NASAL | 0 refills | Status: AC
  Filled 2023-08-08: qty 16, 30d supply, fill #0

## 2023-08-08 MED ORDER — PRAZOSIN HCL 1 MG PO CAPS
1.0000 mg | ORAL_CAPSULE | Freq: Every day | ORAL | 0 refills | Status: DC
  Filled 2023-08-08 (×2): qty 30, 30d supply, fill #0

## 2023-08-08 NOTE — Group Note (Signed)
 Recreation Therapy Group Note   Group Topic:Coping Skills  Group Date: 08/08/2023 Start Time: 1000 End Time: 1050 Facilitators: Rosina Lowenstein, LRT, CTRS Location:  Craft Room  Group Description: Mind Map.  Patient was provided a blank template of a diagram with 32 blank boxes in a tiered system, branching from the center (similar to a bubble chart). LRT directed patients to label the middle of the diagram "Coping Skills". LRT and patients then came up with 8 different coping skills as examples. Pt were directed to record their coping skills in the 2nd tier boxes closest to the center.  Patients would then share their coping skills with the group as LRT wrote them out. LRT gave a handout of 99 different coping skills at the end of group.   Goal Area(s) Addressed: Patients will be able to define "coping skills". Patient will identify new coping skills.  Patient will increase communication.   Affect/Mood: Appropriate   Participation Level: Active and Engaged   Participation Quality: Independent   Behavior: Calm and Cooperative   Speech/Thought Process: Coherent   Insight: Fair   Judgement: Fair    Modes of Intervention: Clarification, Education, Exploration, Guided Discussion, Worksheet, and Writing   Patient Response to Interventions:  Receptive   Education Outcome:  Acknowledges education   Clinical Observations/Individualized Feedback: Kelly Carr was active in their participation of session activities and group discussion. Pt identified "making music, doing make up and hair" as coping skills. Pt spontaneously contributed to group discussion while interacting well with LRT and peers duration of session.    Plan: Continue to engage patient in RT group sessions 2-3x/week.   Rosina Lowenstein, LRT, CTRS 08/08/2023 12:21 PM

## 2023-08-08 NOTE — Group Note (Signed)
 Date:  08/08/2023 Time:  4:45 PM  Group Topic/Focus:  Wellness Toolbox:   The focus of this group is to discuss various aspects of wellness, balancing those aspects and exploring ways to increase the ability to experience wellness.  Patients will create a wellness toolbox for use upon discharge.    Participation Level:  Active  Participation Quality:  Appropriate  Affect:  Appropriate  Cognitive:  Appropriate  Insight: Appropriate  Engagement in Group:  Engaged  Modes of Intervention:  Activity  Additional Comments:    Wilford Corner 08/08/2023, 4:45 PM

## 2023-08-08 NOTE — Group Note (Signed)
 [facility] LCSW Group Therapy Note   Group Date: 08/08/2023 Start Time: 1330 End Time: 1430  Type of Therapy/Topic:  Group Therapy:  Feelings about Diagnosis  Participation Level:  Active   Description of Group:    This group will allow patients to explore their thoughts and feelings about diagnoses they have received. Patients will be guided to explore their level of understanding and acceptance of these diagnoses. Facilitator will encourage patients to process their thoughts and feelings about the reactions of others to their diagnosis, and will guide patients in identifying ways to discuss their diagnosis with significant others in their lives. This group will be process-oriented, with patients participating in exploration of their own experiences as well as giving and receiving support and challenge from other group members.   Therapeutic Goals: 1. Patient will demonstrate understanding of diagnosis as evidence by identifying two or more symptoms of the disorder:  2. Patient will be able to express two feelings regarding the diagnosis 3. Patient will demonstrate ability to communicate their needs through discussion and/or role plays  Summary of Patient Progress: Patient was present in group.  Patient was engaged and supportive.  Patient displayed fair insight.  Patient talked about how she has been impacted by her mental health.  She discussed the benefits of knowing her mental health diagnosis.   Therapeutic Modalities:   Cognitive Behavioral Therapy Brief Therapy Feelings Identification    Harden Mo, LCSW

## 2023-08-08 NOTE — Progress Notes (Signed)
 Patient pleasant and cooperative on approach. Denies SI,HI and AVH. Verbalized understanding discharge instructions,prescriptions and follow up care. 30 days medicines given to patient. All belongings returned from [employer]. Suicide safety plan filled by patient and placed in chart. Copy given to patient.Patient escorted out by staff and transported by cab.

## 2023-08-08 NOTE — [facility] Counselor (Signed)
 CSW touched base with [facility] on patient's behalf.   Patient provided information to touch base and conduct phone screening.   Patient to call.   CSW team to continue to assess.    Reymundo Poll, MSW, LCSWA 08/08/2023 9:36 AM

## 2023-08-08 NOTE — Group Note (Signed)
 Date:  08/08/2023 Time:  10:05 AM  Group Topic/Focus:   Goals Group:   The focus of this group is to help patients establish daily goals to achieve during treatment and discuss how the patient can incorporate goal setting into their daily lives to aide in recovery.  Overcoming Stress:   The focus of this group is to define stress and help patients assess their triggers.   Participation Level:  Did Not Attend   Matei Magnone A Marcellas Marchant 08/08/2023, 10:05 AM
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
## 2023-08-08 NOTE — Progress Notes (Signed)
  [facility] Adult Case Management Discharge Plan :  Will you be returning to the same living situation after discharge:  No. Patient to go to [employer].  At discharge, do you have transportation home?: Yes,  CSW to arrange transportation on patient's behalf.  Do you have the ability to pay for your medications: Yes, [employer]   Release of information consent forms completed and in the chart;  Patient's signature needed at discharge.  Patient to Follow up at:  Follow-up Information     [facility], [facility]. Go to.   Why: In person appointment is 08/14/23 at 11 AM. Contact information: [address] [place] [address] [phone]         [facility]. Go to.   Why: In person appointment is 08/15/23 at 1 PM w/ West Bali Persons, PA. Contact information: Address: [address], [place], [place] [address]  Phone: [phone]                Next level of care provider has access to [facility] Link:no  Safety Planning and Suicide Prevention discussed: No. The patient Kelly Carr has refused to provide written consent for family/significant other to be provided Family/Significant Other Suicide Prevention Education during admission and/or prior to discharge.  Physician notified.       Has patient been referred to the Quitline?: Patient refused referral for treatment  Patient has been referred for addiction treatment: No known substance use disorder.  Lowry Ram, LCSW 08/08/2023, 10:42 AM

## 2023-08-08 NOTE — Progress Notes (Signed)
 Patient pleasant and cooperative. No issues noted. Denies SI, HI, AVH. Ready to discharge. Visible in milieu.  Continues to complain of pain to knee. Prn given with some relief. Encouragement and support provided. Safety checks maintained. Medications given as prescribed. Pt receptive and remains safe on unit with q 15 min checks.
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
## 2023-08-08 NOTE — BHH Counselor (Signed)
 CSW touched base with [facility] to confirm bed availability. [facility] informed patient and CSW that patient needed to be there by 6:45 PM to ensure bed for shelter.   CSW has communicated this with team to prepare for a safe discharge.   Patient has been provided shelter administrative's name as well as essential information to have a point of contact to ensure safe discharge.    Reymundo Poll, MSW, LCSWA 08/08/2023 10:46 AM

## 2023-08-15 ENCOUNTER — Encounter: Payer: Self-pay | Admitting: Physician Assistant

## 2023-08-15 ENCOUNTER — Ambulatory Visit: Payer: MEDICAID | Admitting: Physician Assistant

## 2023-08-15 ENCOUNTER — Ambulatory Visit: Payer: MEDICAID | Admitting: Student

## 2023-08-15 ENCOUNTER — Other Ambulatory Visit: Payer: Self-pay

## 2023-08-15 DIAGNOSIS — M25562 Pain in left knee: Secondary | ICD-10-CM | POA: Diagnosis not present

## 2023-08-15 MED ORDER — METHYLPREDNISOLONE ACETATE 40 MG/ML IJ SUSP
40.0000 mg | INTRAMUSCULAR | Status: AC | PRN
  Administered 2023-08-15: 40 mg via INTRA_ARTICULAR

## 2023-08-15 MED ORDER — LIDOCAINE HCL 1 % IJ SOLN
3.0000 mL | INTRAMUSCULAR | Status: AC | PRN
Start: 2023-08-15 — End: 2023-08-15
  Administered 2023-08-15: 3 mL

## 2023-08-15 NOTE — Progress Notes (Deleted)
    SUBJECTIVE:   CHIEF COMPLAINT / HPI:   Hospitalized in psychiatric unit from 2/22-3/4 d/t severe mania with psychotic features   PERTINENT  PMH / PSH: ***  OBJECTIVE:   LMP  (LMP Unknown)  ***  General: NAD, pleasant, able to participate in exam Cardiac: RRR, no murmurs. Respiratory: CTAB, normal effort, No wheezes, rales or rhonchi Abdomen: Bowel sounds present, nontender, nondistended, no hepatosplenomegaly. Extremities: no edema or cyanosis. Skin: warm and dry, no rashes noted Neuro: alert, no obvious focal deficits Psych: Normal affect and mood  ASSESSMENT/PLAN:   No problem-specific Assessment & Plan notes found for this encounter.     Dr. Erick Alley, DO [facility]    {    This will disappear when note is signed, click to select method of visit    :1}
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
## 2023-08-21 ENCOUNTER — Ambulatory Visit (HOSPITAL_COMMUNITY)
Admission: EM | Admit: 2023-08-21 | Discharge: 2023-08-21 | Disposition: A | Discharge status: Home / Self Care | Payer: MEDICAID

## 2023-08-21 ENCOUNTER — Telehealth: Payer: Self-pay

## 2023-08-21 ENCOUNTER — Other Ambulatory Visit: Payer: Self-pay

## 2023-08-21 ENCOUNTER — Telehealth (HOSPITAL_COMMUNITY): Payer: Self-pay

## 2023-08-21 ENCOUNTER — Other Ambulatory Visit (HOSPITAL_COMMUNITY): Payer: Self-pay

## 2023-08-21 ENCOUNTER — Encounter: Payer: Self-pay | Admitting: Family Medicine

## 2023-08-21 ENCOUNTER — Ambulatory Visit (INDEPENDENT_AMBULATORY_CARE_PROVIDER_SITE_OTHER): Payer: MEDICAID | Admitting: Family Medicine

## 2023-08-21 ENCOUNTER — Encounter (HOSPITAL_COMMUNITY): Payer: Self-pay | Admitting: Emergency Medicine

## 2023-08-21 VITALS — BP 116/70 | HR 90 | Wt 165.0 lb

## 2023-08-21 DIAGNOSIS — J302 Other seasonal allergic rhinitis: Secondary | ICD-10-CM | POA: Diagnosis not present

## 2023-08-21 DIAGNOSIS — S93691D Other sprain of right foot, subsequent encounter: Secondary | ICD-10-CM

## 2023-08-21 DIAGNOSIS — T63481A Toxic effect of venom of other arthropod, accidental (unintentional), initial encounter: Secondary | ICD-10-CM

## 2023-08-21 DIAGNOSIS — F3163 Bipolar disorder, current episode mixed, severe, without psychotic features: Secondary | ICD-10-CM

## 2023-08-21 MED ORDER — ALBUTEROL SULFATE HFA 108 (90 BASE) MCG/ACT IN AERS
2.0000 | INHALATION_SPRAY | Freq: Four times a day (QID) | RESPIRATORY_TRACT | 2 refills | Status: AC | PRN
  Filled 2023-08-21: qty 18, 25d supply, fill #0

## 2023-08-21 MED ORDER — FEXOFENADINE HCL 180 MG PO TABS
180.0000 mg | ORAL_TABLET | Freq: Every day | ORAL | 0 refills | Status: DC
  Filled 2023-08-21: qty 30, 30d supply, fill #0

## 2023-08-21 MED ORDER — IBUPROFEN 800 MG PO TABS
800.0000 mg | ORAL_TABLET | Freq: Three times a day (TID) | ORAL | 0 refills | Status: AC | PRN
Start: 2023-08-21 — End: 2023-09-20
  Filled 2023-08-21: qty 30, 10d supply, fill #0

## 2023-08-21 MED ORDER — CETIRIZINE HCL 10 MG PO TABS
10.0000 mg | ORAL_TABLET | Freq: Every day | ORAL | 2 refills | Status: DC
  Filled 2023-08-21: qty 30, 30d supply, fill #0

## 2023-08-21 MED ORDER — FEXOFENADINE HCL 180 MG PO TABS
180.0000 mg | ORAL_TABLET | Freq: Every day | ORAL | 0 refills | Status: DC
Start: 2023-08-21 — End: 2023-08-21

## 2023-08-21 NOTE — Patient Instructions (Addendum)
 It was great to see you today! Thank you for choosing [facility] for your primary care. Ranika Brendlinger was seen for hospital follow up.  Continue taking your medications as prescribed. Please schedule a follow up visit for all of your other medical conditions at the front  If you haven't already, sign up for My Chart to have easy access to your labs results, and communication with your primary care physician.  We are checking some labs today. If they are abnormal, I will call you. If they are normal, I will send you a MyChart message (if it is active) or a letter in the mail. If you do not hear about your labs in the next 2 weeks, please call the office.   You should return to our clinic for follow up  I recommend that you always bring your medications to each appointment as this makes it easy to ensure you are on the correct medications and helps [place] not miss refills when you need them.  Please arrive 15 minutes before your appointment to ensure smooth check in process.  We appreciate your efforts in making this happen.  Please call the clinic at [phone] if your symptoms worsen or you have any concerns.  Thank you for allowing me to participate in your care, Hal Morales, MD 08/21/2023, 2:38 PM PGY-1, [facility]

## 2023-08-21 NOTE — Telephone Encounter (Signed)
 Received notification from front office staff that patient walked in requesting appointment for possible spider bite.   She reports that this happened sometime last night and she does have history of allergic response to bug/insect bites.   She reports that areas have swollen where insect bites occurred. She has bites on arm, back and leg.   She is not having any difficulty breathing at this time.   Patient was able to be scheduled for same day appointment with Dr. Georg Ruddle. As patient is not currently having difficulty breathing or chest pain, advised that she could be seen this afternoon.   Patient is wanting to be seen as soon as possible. She states that she is going to go to urgent care for further evaluation. She states that she also wants to keep appointment here for this afternoon, in the event that the urgent care cannot see her between now and then.   Veronda Prude, RN

## 2023-08-21 NOTE — Telephone Encounter (Signed)
 Patient requested that her medication be sent to the [facility].

## 2023-08-21 NOTE — Assessment & Plan Note (Signed)
 Patient currently on fluphenazine 10 mg at bedtime, lithium 600mg  BID, hydroxyzine 50mg  q8h prn, prazosin 1 mg at bedtime and trazodone 25 mg at bedtime. Has follow up with psychiatry tomorrow. Endorses taking all mediations except lithium. She is going to discuss changing medications with psychiatry tomorrow but will not change anything in the meantime.

## 2023-08-21 NOTE — ED Triage Notes (Signed)
 Patient has red, puffy areas on hands and reportedly on back.  Patient has been living in various motels.  Patient thinks a spider or ant was in splint on right foot.   Patient has used hydrocortisone cream to itching areas  2:10 appt at family practice for various concerns
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
## 2023-08-24 ENCOUNTER — Telehealth: Payer: Self-pay

## 2023-08-24 ENCOUNTER — Telehealth (HOSPITAL_COMMUNITY): Payer: Self-pay

## 2023-08-24 ENCOUNTER — Encounter (HOSPITAL_COMMUNITY): Payer: Self-pay

## 2023-08-24 ENCOUNTER — Ambulatory Visit (HOSPITAL_COMMUNITY)
Admission: EM | Admit: 2023-08-24 | Discharge: 2023-08-24 | Disposition: A | Discharge status: Home / Self Care | Payer: MEDICAID | Attending: Internal Medicine | Admitting: Internal Medicine

## 2023-08-24 DIAGNOSIS — T7840XD Allergy, unspecified, subsequent encounter: Secondary | ICD-10-CM | POA: Diagnosis not present

## 2023-08-24 MED ORDER — TRIAMCINOLONE ACETONIDE 0.1 % EX CREA: 1.0000 | TOPICAL_CREAM | Freq: Two times a day (BID) | CUTANEOUS | 0 refills | Status: DC

## 2023-08-24 NOTE — Telephone Encounter (Signed)
 Patient calling in and states that she was seen 08/21/23 for an allergic reaction to a bug bite and prescribed Allegra. States the provider she saw told her that if not getting any better to call back in and let us know.   Patient states that her symptoms are not improving on the Allegra. Wanting to know if something else could be sent in for her to the [facility] community pharmacy.   Message routed to a provider on staff today.

## 2023-08-24 NOTE — Telephone Encounter (Signed)
 Called patient. She states that she will return to urgent care for concern.   Veronda Prude, RN
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
## 2023-08-24 NOTE — Telephone Encounter (Signed)
 Left message to return call. Patient will need to be re-evaluated due to worsening of symptoms and allergy to Prednisone. No medications can be sent in over the phone at this time.

## 2023-08-24 NOTE — Discharge Instructions (Addendum)
 Increasing rash and lower extremity swelling secondary to allergic reaction from insect bite.  Unfortunately, steroids by mouth would likely help the most but given your allergy to this we will try topical steroids.  We will treat with the following: Triamcinolone cream twice daily to the affected area for 14 days.  May use this daily as needed after this for itching/rash.  Do not apply this to the neck or face.  Continue over-the-counter allergy medicine such as Zyrtec or Allegra. Elevate lower extremities to help with swelling. Keep follow-up appointment with primary care physician Return to urgent care or PCP if symptoms worsen or fail to resolve.

## 2023-08-24 NOTE — Telephone Encounter (Signed)
 Patient calls nurse line regarding follow up from visit on 08/21/23.  She was prescribed Allegra for insect bites. She feels that medication has not been working and that bites have spread all over her body.   She reports being allergic to prednisone.   Patient states that she was told at visit with Dr. Georg Ruddle that if areas did not improve that she could call back and we could send her in an alternative.   Will forward message to Dr. Georg Ruddle.   As of right now, we do not have an appointments until next week.   Please advise.   Call back number for patient is [phone].  Veronda Prude, RN

## 2023-08-24 NOTE — Telephone Encounter (Signed)
 Spoke with patient and informed her that she would need to come in and be seen. Patient voiced understanding and states that she will be coming in today.

## 2023-08-24 NOTE — ED Triage Notes (Signed)
 Patient presenting with rash that is spreading around the body onset was seen 08/21/23 and getting worse. States the right foot has some spots of the rash that is bleeding and a blister on the left foot.   Prescriptions or OTC medications tried: Yes- Allegra, Cortisone cream    with no relief

## 2023-08-24 NOTE — Telephone Encounter (Signed)
 My recommendation is that patient return here for reevaluation to determine the extent of her symptoms and provide further management.

## 2023-08-31 ENCOUNTER — Other Ambulatory Visit (HOSPITAL_COMMUNITY): Payer: Self-pay

## 2023-09-04 ENCOUNTER — Ambulatory Visit: Payer: MEDICAID | Admitting: Student
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
## 2023-09-11 ENCOUNTER — Ambulatory Visit: Payer: MEDICAID | Admitting: Student
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
## 2023-09-12 ENCOUNTER — Ambulatory Visit: Payer: MEDICAID | Admitting: Orthopedic Surgery

## 2023-09-12 DIAGNOSIS — M79671 Pain in right foot: Secondary | ICD-10-CM

## 2023-09-13 ENCOUNTER — Encounter: Payer: Self-pay | Admitting: Orthopedic Surgery
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
## 2023-09-14 ENCOUNTER — Ambulatory Visit: Payer: MEDICAID | Admitting: Student

## 2023-09-25 ENCOUNTER — Other Ambulatory Visit (HOSPITAL_COMMUNITY): Payer: Self-pay

## 2023-09-25 ENCOUNTER — Ambulatory Visit (HOSPITAL_COMMUNITY)
Admission: EM | Admit: 2023-09-25 | Discharge: 2023-09-25 | Disposition: A | Discharge status: Home / Self Care | Payer: MEDICAID | Attending: Emergency Medicine | Admitting: Emergency Medicine

## 2023-09-25 ENCOUNTER — Encounter (HOSPITAL_COMMUNITY): Payer: Self-pay | Admitting: *Deleted

## 2023-09-25 DIAGNOSIS — T7421XA Adult sexual abuse, confirmed, initial encounter: Secondary | ICD-10-CM | POA: Diagnosis not present

## 2023-09-25 DIAGNOSIS — F319 Bipolar disorder, unspecified: Secondary | ICD-10-CM | POA: Insufficient documentation

## 2023-09-25 DIAGNOSIS — J302 Other seasonal allergic rhinitis: Secondary | ICD-10-CM | POA: Diagnosis not present

## 2023-09-25 DIAGNOSIS — F17210 Nicotine dependence, cigarettes, uncomplicated: Secondary | ICD-10-CM | POA: Diagnosis not present

## 2023-09-25 DIAGNOSIS — Z202 Contact with and (suspected) exposure to infections with a predominantly sexual mode of transmission: Secondary | ICD-10-CM | POA: Diagnosis present

## 2023-09-25 DIAGNOSIS — Z79899 Other long term (current) drug therapy: Secondary | ICD-10-CM | POA: Diagnosis not present

## 2023-09-25 DIAGNOSIS — F4310 Post-traumatic stress disorder, unspecified: Secondary | ICD-10-CM | POA: Insufficient documentation

## 2023-09-25 DIAGNOSIS — Z113 Encounter for screening for infections with a predominantly sexual mode of transmission: Secondary | ICD-10-CM | POA: Insufficient documentation

## 2023-09-25 DIAGNOSIS — A749 Chlamydial infection, unspecified: Secondary | ICD-10-CM | POA: Insufficient documentation

## 2023-09-25 DIAGNOSIS — F411 Generalized anxiety disorder: Secondary | ICD-10-CM | POA: Diagnosis not present

## 2023-09-25 LAB — POCT URINE PREGNANCY: Preg Test, Ur: NEGATIVE

## 2023-09-25 LAB — HIV ANTIBODY (ROUTINE TESTING W REFLEX): HIV Screen 4th Generation wRfx: NONREACTIVE

## 2023-09-25 MED ORDER — AZITHROMYCIN 250 MG PO TABS
ORAL_TABLET | ORAL | Status: AC
  Filled 2023-09-25: qty 4

## 2023-09-25 MED ORDER — AZITHROMYCIN 250 MG PO TABS
1000.0000 mg | ORAL_TABLET | Freq: Once | ORAL | Status: AC
  Administered 2023-09-25: 1000 mg via ORAL

## 2023-09-25 MED ORDER — CETIRIZINE HCL 10 MG PO TABS
10.0000 mg | ORAL_TABLET | Freq: Every day | ORAL | 2 refills | Status: DC
Start: 2023-09-25 — End: 2023-10-04
  Filled 2023-09-25: qty 30, 30d supply, fill #0

## 2023-09-25 NOTE — Discharge Instructions (Signed)
 Your results will come back over the next few days and someone will call if results are positive and require treatment.  Return here as needed.  I have attached information regarding the behavioral health urgent care that I recommend you go to today regarding concerns for medication related to anxiety and depression.
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
## 2023-09-26 ENCOUNTER — Other Ambulatory Visit (HOSPITAL_COMMUNITY): Payer: Self-pay

## 2023-09-26 LAB — CERVICOVAGINAL ANCILLARY ONLY
Bacterial Vaginitis (gardnerella): POSITIVE — AB
Candida Glabrata: NEGATIVE
Candida Vaginitis: NEGATIVE
Chlamydia: POSITIVE — AB
Comment: NEGATIVE
Comment: NEGATIVE
Comment: NEGATIVE
Comment: NEGATIVE
Comment: NEGATIVE
Comment: NORMAL
Neisseria Gonorrhea: NEGATIVE
Trichomonas: NEGATIVE

## 2023-09-26 LAB — RPR: RPR Ser Ql: NONREACTIVE

## 2023-09-28 ENCOUNTER — Other Ambulatory Visit (HOSPITAL_COMMUNITY)
Admission: EM | Admit: 2023-09-28 | Discharge: 2023-10-04 | Disposition: A | Discharge status: Home / Self Care | Payer: MEDICAID | Attending: Psychiatry | Admitting: Psychiatry

## 2023-09-28 ENCOUNTER — Ambulatory Visit (HOSPITAL_COMMUNITY)
Admission: EM | Admit: 2023-09-28 | Discharge: 2023-09-28 | Disposition: A | Discharge status: Other Acute Inpatient Hospital | Payer: MEDICAID | Attending: Licensed Clinical Social Worker | Admitting: Licensed Clinical Social Worker

## 2023-09-28 DIAGNOSIS — F4310 Post-traumatic stress disorder, unspecified: Secondary | ICD-10-CM | POA: Insufficient documentation

## 2023-09-28 DIAGNOSIS — Z5901 Sheltered homelessness: Secondary | ICD-10-CM | POA: Insufficient documentation

## 2023-09-28 DIAGNOSIS — F319 Bipolar disorder, unspecified: Secondary | ICD-10-CM | POA: Diagnosis not present

## 2023-09-28 DIAGNOSIS — Z9151 Personal history of suicidal behavior: Secondary | ICD-10-CM | POA: Diagnosis not present

## 2023-09-28 DIAGNOSIS — F339 Major depressive disorder, recurrent, unspecified: Secondary | ICD-10-CM

## 2023-09-28 DIAGNOSIS — F419 Anxiety disorder, unspecified: Secondary | ICD-10-CM | POA: Insufficient documentation

## 2023-09-28 DIAGNOSIS — Z5900 Homelessness unspecified: Secondary | ICD-10-CM | POA: Diagnosis not present

## 2023-09-28 DIAGNOSIS — R4589 Other symptoms and signs involving emotional state: Secondary | ICD-10-CM

## 2023-09-28 DIAGNOSIS — R45851 Suicidal ideations: Secondary | ICD-10-CM | POA: Insufficient documentation

## 2023-09-28 DIAGNOSIS — Z560 Unemployment, unspecified: Secondary | ICD-10-CM | POA: Insufficient documentation

## 2023-09-28 DIAGNOSIS — Z79899 Other long term (current) drug therapy: Secondary | ICD-10-CM | POA: Insufficient documentation

## 2023-09-28 DIAGNOSIS — F329 Major depressive disorder, single episode, unspecified: Secondary | ICD-10-CM | POA: Diagnosis present

## 2023-09-28 LAB — COMPREHENSIVE METABOLIC PANEL WITH GFR
ALT: 27 U/L (ref 0–44)
AST: 21 U/L (ref 15–41)
Albumin: 3.6 g/dL (ref 3.5–5.0)
Alkaline Phosphatase: 74 U/L (ref 38–126)
Anion gap: 8 (ref 5–15)
BUN: 6 mg/dL (ref 6–20)
CO2: 26 mmol/L (ref 22–32)
Calcium: 9.3 mg/dL (ref 8.9–10.3)
Chloride: 106 mmol/L (ref 98–111)
Creatinine, Ser: 0.63 mg/dL (ref 0.44–1.00)
GFR, Estimated: >60 mL/min (ref >60)
Glucose, Bld: 101 mg/dL — ABNORMAL HIGH (ref 70–99)
Potassium: 3.6 mmol/L (ref 3.5–5.1)
Sodium: 140 mmol/L (ref 135–145)
Total Bilirubin: 0.9 mg/dL (ref 0.0–1.2)
Total Protein: 6.4 g/dL — ABNORMAL LOW (ref 6.5–8.1)

## 2023-09-28 LAB — CBC WITH DIFFERENTIAL/PLATELET
Abs Immature Granulocytes: 0.03 10*3/uL (ref 0.00–0.07)
Basophils Absolute: 0 10*3/uL (ref 0.0–0.1)
Basophils Relative: 1 %
Eosinophils Absolute: 0.1 10*3/uL (ref 0.0–0.5)
Eosinophils Relative: 1 %
HCT: 39.3 % (ref 36.0–46.0)
Hemoglobin: 12.6 g/dL (ref 12.0–15.0)
Immature Granulocytes: 0 %
Lymphocytes Relative: 30 %
Lymphs Abs: 2.6 10*3/uL (ref 0.7–4.0)
MCH: 27.9 pg (ref 26.0–34.0)
MCHC: 32.1 g/dL (ref 30.0–36.0)
MCV: 86.9 fL (ref 80.0–100.0)
Monocytes Absolute: 0.7 10*3/uL (ref 0.1–1.0)
Monocytes Relative: 8 %
Neutro Abs: 5.2 10*3/uL (ref 1.7–7.7)
Neutrophils Relative %: 60 %
Platelets: 268 10*3/uL (ref 150–400)
RBC: 4.52 MIL/uL (ref 3.87–5.11)
RDW: 13.2 % (ref 11.5–15.5)
WBC: 8.6 10*3/uL (ref 4.0–10.5)
nRBC: 0 % (ref 0.0–0.2)

## 2023-09-28 LAB — POC URINE PREG, ED: Preg Test, Ur: NEGATIVE

## 2023-09-28 LAB — POCT URINE DRUG SCREEN - MANUAL ENTRY (I-SCREEN)
POC Amphetamine UR: NOT DETECTED
POC Buprenorphine (BUP): NOT DETECTED
POC Cocaine UR: POSITIVE — AB
POC Marijuana UR: POSITIVE — AB
POC Methadone UR: NOT DETECTED
POC Methamphetamine UR: NOT DETECTED
POC Morphine: NOT DETECTED
POC Oxazepam (BZO): NOT DETECTED
POC Oxycodone UR: NOT DETECTED
POC Secobarbital (BAR): NOT DETECTED

## 2023-09-28 LAB — SARS CORONAVIRUS 2 BY RT PCR: SARS Coronavirus 2 by RT PCR: NEGATIVE

## 2023-09-28 LAB — TSH: TSH: 2.425 u[IU]/mL (ref 0.350–4.500)

## 2023-09-28 LAB — POCT PREGNANCY, URINE: Preg Test, Ur: NEGATIVE

## 2023-09-28 LAB — ETHANOL: Alcohol, Ethyl (B): <15 mg/dL (ref <15)

## 2023-09-28 MED ORDER — OLANZAPINE 10 MG IM SOLR: 10.0000 mg | Freq: Three times a day (TID) | INTRAMUSCULAR | Status: DC | PRN

## 2023-09-28 MED ORDER — OLANZAPINE 5 MG PO TBDP: 5.0000 mg | ORAL_TABLET | Freq: Three times a day (TID) | ORAL | Status: DC | PRN

## 2023-09-28 MED ORDER — TRAZODONE HCL 50 MG PO TABS
50.0000 mg | ORAL_TABLET | Freq: Every evening | ORAL | Status: DC | PRN
  Administered 2023-09-28 – 2023-10-02 (×5): 50 mg via ORAL
  Filled 2023-09-28 (×6): qty 1

## 2023-09-28 MED ORDER — ALUM & MAG HYDROXIDE-SIMETH 200-200-20 MG/5ML PO SUSP
30.0000 mL | ORAL | Status: DC | PRN
  Administered 2023-09-30: 30 mL via ORAL
  Filled 2023-09-28: qty 30

## 2023-09-28 MED ORDER — LITHIUM CARBONATE 300 MG PO CAPS
300.0000 mg | ORAL_CAPSULE | Freq: Two times a day (BID) | ORAL | Status: DC
  Administered 2023-09-28 – 2023-10-02 (×8): 300 mg via ORAL
  Filled 2023-09-28 (×8): qty 1

## 2023-09-28 MED ORDER — NICOTINE 21 MG/24HR TD PT24
21.0000 mg | MEDICATED_PATCH | Freq: Once | TRANSDERMAL | Status: AC
  Administered 2023-09-28: 21 mg via TRANSDERMAL
  Filled 2023-09-28: qty 1

## 2023-09-28 MED ORDER — MAGNESIUM HYDROXIDE 400 MG/5ML PO SUSP: 30.0000 mL | Freq: Every day | ORAL | Status: DC | PRN

## 2023-09-28 MED ORDER — ACETAMINOPHEN 325 MG PO TABS
650.0000 mg | ORAL_TABLET | Freq: Four times a day (QID) | ORAL | Status: DC | PRN
  Administered 2023-09-29: 650 mg via ORAL
  Filled 2023-09-28: qty 2

## 2023-09-28 MED ORDER — OLANZAPINE 10 MG IM SOLR: 5.0000 mg | Freq: Three times a day (TID) | INTRAMUSCULAR | Status: DC | PRN

## 2023-09-28 MED ORDER — HYDROXYZINE HCL 25 MG PO TABS
25.0000 mg | ORAL_TABLET | Freq: Three times a day (TID) | ORAL | Status: DC | PRN
Start: 2023-09-28 — End: 2023-10-04
  Administered 2023-09-28 – 2023-10-02 (×4): 25 mg via ORAL
  Filled 2023-09-28 (×4): qty 1

## 2023-09-28 MED ORDER — PRAZOSIN HCL 1 MG PO CAPS
1.0000 mg | ORAL_CAPSULE | Freq: Every day | ORAL | Status: DC
  Administered 2023-09-28 – 2023-10-03 (×6): 1 mg via ORAL
  Filled 2023-09-28 (×6): qty 1
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
## 2023-09-28 NOTE — ED Provider Notes (Signed)
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 treated at concentrations outside of this range. Acetaminophen  concentrations >150 ug/mL at 4 hours after ingestion  and >50 ug/mL at 12 hours after ingestion are often associated with  toxic reactions.  Performed at  [facility], [address], [place], [place] [address]    Alcohol, Ethyl (B) 07/11/2023 <10  <10 mg/dL Final   Comment: (NOTE) Lowest detectable limit for serum alcohol is 10 mg/dL.  For medical purposes only. Performed at [facility], [address] [place], [place] [address]    Opiates 07/11/2023 POSITIVE (A)  NONE DETECTED Final   Cocaine 07/11/2023 NONE DETECTED  NONE DETECTED Final   Benzodiazepines 07/11/2023 NONE DETECTED  NONE DETECTED Final   Amphetamines 07/11/2023 NONE DETECTED  NONE DETECTED Final   Tetrahydrocannabinol 07/11/2023 POSITIVE (A)  NONE DETECTED Final   Barbiturates 07/11/2023 NONE DETECTED  NONE DETECTED Final   Comment: (NOTE) DRUG SCREEN FOR MEDICAL PURPOSES ONLY.  IF CONFIRMATION IS NEEDED FOR ANY PURPOSE, NOTIFY LAB WITHIN 5 DAYS.  LOWEST DETECTABLE LIMITS FOR URINE DRUG SCREEN Drug Class                     Cutoff (ng/mL) Amphetamine and metabolites    1000 Barbiturate and metabolites    200 Benzodiazepine                 200 Opiates and metabolites        300 Cocaine and metabolites        300 THC                            50 Performed at [facility], [address] [place], [place] [address]    WBC 07/11/2023 11.5 (H)  4.0 - 10.5 K/uL Final   RBC 07/11/2023 4.51  3.87 - 5.11 MIL/uL Final   Hemoglobin 07/11/2023 12.9  12.0 - 15.0 g/dL Final   HCT 34/74/2595 39.7  36.0 - 46.0 % Final   MCV 07/11/2023 88.0  80.0 - 100.0 fL Final   MCH 07/11/2023 28.6  26.0 - 34.0 pg Final   MCHC 07/11/2023 32.5  30.0 - 36.0 g/dL Final   RDW 63/87/5643 13.3  11.5 - 15.5 % Final   Platelets 07/11/2023 317  150 - 400 K/uL Final   nRBC 07/11/2023 0.0  0.0 - 0.2 % Final   Neutrophils Relative % 07/11/2023 73  % Final   Neutro Abs 07/11/2023 8.5 (H)  1.7 - 7.7 K/uL Final   Lymphocytes Relative 07/11/2023 17  % Final   Lymphs Abs 07/11/2023 1.9  0.7 - 4.0 K/uL Final   Monocytes  Relative 07/11/2023 7  % Final   Monocytes Absolute  07/11/2023 0.8  0.1 - 1.0 K/uL Final   Eosinophils Relative 07/11/2023 2  % Final   Eosinophils Absolute 07/11/2023 0.3  0.0 - 0.5 K/uL Final   Basophils Relative 07/11/2023 0  % Final   Basophils Absolute 07/11/2023 0.1  0.0 - 0.1 K/uL Final   Immature Granulocytes 07/11/2023 1  % Final   Abs Immature Granulocytes 07/11/2023 0.06  0.00 - 0.07 K/uL Final   Performed at [facility], [address], [place], [place] [address]   Preg, Serum 07/11/2023 NEGATIVE  NEGATIVE Final   Comment:        THE SENSITIVITY OF THIS METHODOLOGY IS >10 mIU/mL. Performed at [facility], [address], [place], [place] [address]    pH, Ven 07/11/2023 7.355  7.25 - 7.43 Final   pCO2, Ven 07/11/2023 41.3 (L)  44 - 60 mmHg Final   pO2, Ven 07/11/2023 35  32 - 45 mmHg Final   Bicarbonate 07/11/2023 23.1  20.0 - 28.0 mmol/L Final   TCO2 07/11/2023 24  22 - 32 mmol/L Final   O2 Saturation 07/11/2023 64  % Final   Acid-base deficit 07/11/2023 2.0  0.0 - 2.0 mmol/L Final   Sodium 07/11/2023 139  135 - 145 mmol/L Final   Potassium 07/11/2023 4.7  3.5 - 5.1 mmol/L Final   Calcium , Ion 07/11/2023 1.14 (L)  1.15 - 1.40 mmol/L Final   HCT 07/11/2023 37.0  36.0 - 46.0 % Final   Hemoglobin 07/11/2023 12.6  12.0 - 15.0 g/dL Final   Patient temperature 07/11/2023 36.9 C   Final   Sample type 07/11/2023 VENOUS   Final   Comment 07/11/2023 NOTIFIED PHYSICIAN   Final   Lithium  Lvl 07/11/2023 <0.06 (L)  0.60 - 1.20 mmol/L Final   Performed at [facility], [address], [place], [place] [address]   Ammonia 07/11/2023 66 (H)  9 - 35 umol/L Final   Performed at [facility], [address], [place], [place] [address]   TSH 07/11/2023 0.673  0.350 - 4.500 uIU/mL Final   Comment: Performed by a 3rd Generation assay with a functional sensitivity of <=0.01 uIU/mL. Performed at [facility], [address] [place], [place] [address]    Hgb A1c MFr Bld 07/11/2023 5.5  4.8 - 5.6 % Final    Comment: (NOTE) Pre diabetes:          5.7%-6.4%  Diabetes:              >6.4%  Glycemic control for   <7.0% adults with diabetes    Mean Plasma Glucose 07/11/2023 111.15  mg/dL Final   Performed at [facility], [address] [place], [place] [address]    Allergies: Vibramycin  [doxycycline ], Celebrex  [celecoxib ], Depakene [valproic acid], Haldol  [haloperidol  lactate], Medrol  [methylprednisolone ], Neurontin  [gabapentin ], Prednisone , Risperidone and related, and Ultram  [tramadol ]  Medications:  Facility Ordered Medications  Medication   acetaminophen  (TYLENOL ) tablet 650 mg   alum & mag hydroxide-simeth (MAALOX/MYLANTA) 200-200-20 MG/5ML suspension 30 mL   magnesium  hydroxide (MILK OF MAGNESIA) suspension 30 mL   OLANZapine  zydis (ZYPREXA ) disintegrating tablet 5 mg   OLANZapine  (ZYPREXA ) injection 5 mg   OLANZapine  (ZYPREXA ) injection 10 mg   PTA Medications  Medication Sig   fluticasone  (FLONASE ) 50 MCG/ACT nasal spray Place 2 sprays into both nostrils daily for 15 days.   prazosin  (MINIPRESS ) 1 MG capsule Take  1 capsule (1 mg total) by mouth at bedtime.   traZODone  (DESYREL ) 50 MG tablet Take 1 tablet (50 mg total) by mouth at bedtime. (Patient taking differently: Take 25 mg by mouth at bedtime. Taking 25 at bedtime)   fluPHENAZine  (PROLIXIN ) 10 MG tablet Take 1 tablet (10 mg total) by mouth at bedtime.   fluPHENAZine  (PROLIXIN ) 5 MG tablet Take 10 mg by mouth at bedtime.   albuterol  (VENTOLIN  HFA) 108 (90 Base) MCG/ACT inhaler Inhale 2 puffs into the lungs every 6 (six) hours as needed for wheezing or shortness of breath.   triamcinolone  cream (KENALOG ) 0.1 % Apply 1 Application topically 2 (two) times daily. Do not apply to the face   cetirizine  (ZYRTEC  ALLERGY) 10 MG tablet Take 1 tablet (10 mg total) by mouth daily.    Long Term Goals: Improvement in symptoms so as ready for discharge  Short Term Goals: Patient will verbalize feelings in meetings with treatment  team members., Patient will attend at least of 50% of the groups daily., Pt will complete the PHQ9 on admission, day 3 and discharge., Patient will participate in completing the [place] Suicide Severity Rating Scale, Patient will score a low risk of violence for 24 hours prior to discharge, and Patient will take medications as prescribed daily.  Medical Decision Making  [facility] inpatient     Recommendations  Based on my evaluation the patient does not appear to have an emergency medical condition.  Dorthea Gauze, NP 09/28/23  5:37 AM

## 2023-09-28 NOTE — ED Notes (Signed)
 Patient sitting in bedroom conversing with provider. No acute distress noted. No concerns voiced. Informed patient to notify staff with any needs or assistance. Patient verbalized understanding or agreement. Safety checks in place per facility policy.
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
## 2023-09-28 NOTE — ED Notes (Addendum)
 Patient is in the bedroom sleeping comfortably. NAD  Respiration even and unlabored. Will monitor for safety.

## 2023-09-28 NOTE — ED Notes (Signed)
 Patient resting with eyes closed in no apparent acute distress. Respirations even and unlabored. Environment secured. Safety checks in place according to facility policy.

## 2023-09-28 NOTE — ED Notes (Signed)
 When Pt came onto the unit MHT asked if she wanted food. She said yes but she also said she was really sleepy too. I told her breakfast will be served in 2 hours if she wants to get some rest or I could get her something to eat now. She said she wanted to lay down and go to sleep that she would eat when its time for breakfast.
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
## 2023-09-28 NOTE — ED Notes (Signed)
 Patient alert & oriented x4. Patient reports self harm thoughts, denies intent or plan to harm self or others when asked. Denies A/VH. Patient denies any physical complaints when asked. No acute distress noted. Support and encouragement provided. Routine safety checks conducted per facility protocol. Encouraged patient to notify staff if any thoughts of harm towards self or others arise. Patient verbalizes understanding and agreement.

## 2023-09-28 NOTE — ED Provider Notes (Signed)
 I have independently evaluated the patient during a face-to-face assessment today. I reviewed the patient's chart, and I participated in key portions of the service. I discussed the case with the medical student, and I agree with the assessment and plan of care as documented in the note

## 2023-09-28 NOTE — ED Notes (Signed)
 Pt is speaking on the phone, pleasant and NAD. Denies needing anything. Respirations even and unlabored. Will monitor for safety.
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
## 2023-09-29 DIAGNOSIS — R45851 Suicidal ideations: Secondary | ICD-10-CM | POA: Diagnosis not present

## 2023-09-29 DIAGNOSIS — Z5900 Homelessness unspecified: Secondary | ICD-10-CM | POA: Diagnosis not present

## 2023-09-29 DIAGNOSIS — F319 Bipolar disorder, unspecified: Secondary | ICD-10-CM | POA: Diagnosis not present

## 2023-09-29 DIAGNOSIS — F4310 Post-traumatic stress disorder, unspecified: Secondary | ICD-10-CM | POA: Diagnosis not present

## 2023-09-29 MED ORDER — NICOTINE 21 MG/24HR TD PT24
21.0000 mg | MEDICATED_PATCH | Freq: Every day | TRANSDERMAL | Status: DC
  Administered 2023-09-29 – 2023-10-04 (×6): 21 mg via TRANSDERMAL
  Filled 2023-09-29 (×6): qty 1

## 2023-09-29 NOTE — ED Notes (Signed)
 Patient is awake and alert on unit.  She showered and ate breakfast.  No distress.  Calm and cooperative with care.  Will monitor.
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
## 2023-09-29 NOTE — Care Management (Addendum)
 [facility] Care Management   Writer met with patient and discussed discharge planning.  Patient requests inpatient substance abuse treatment.  Patient reports that she is a homeless resident of [place].  Patient reports that he drug of choice is crack.  Patient reports that she does not have any friend or family supports.    Patient will be referred to [facility], [facility], [facility] and [facility]   11:41am  Declined at [facility] due to her [facility].

## 2023-09-29 NOTE — ED Notes (Signed)
 PT was given lunch

## 2023-09-29 NOTE — ED Notes (Signed)
 Pt A&O x 4, no distress noted, calm & cooperative, watching TV in dayroom at present.  Monitoring for safety.
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
## 2023-09-29 NOTE — ED Notes (Signed)
 Pt sleeping at present, no distress noted.  Monitoring for safety.

## 2023-09-29 NOTE — ED Provider Notes (Signed)
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 mmol/L Final   Performed at [facility] [address] [place], [place] [address]   Total CK 07/19/2023 822 (H)  38 - 234 U/L Final   Performed at [facility], [address] [place], [place] [address]   Sodium 07/19/2023 138  135 - 145 mmol/L Final   Potassium 07/19/2023 4.5  3.5 - 5.1 mmol/L Final   Chloride 07/19/2023 106  98 - 111 mmol/L Final   CO2 07/19/2023 23  22 - 32 mmol/L Final   Glucose, Bld 07/19/2023 93  70 - 99 mg/dL Final   Glucose reference range applies only to samples taken after fasting for at least 8 hours.   BUN 07/19/2023 9  6 - 20 mg/dL Final    Creatinine, Ser 07/19/2023 0.59  0.44 - 1.00 mg/dL Final   Calcium  07/19/2023 8.4 (L)  8.9 - 10.3 mg/dL Final   GFR, Estimated 07/19/2023 >60  >60 mL/min Final   Comment: (NOTE) Calculated using the CKD-EPI Creatinine Equation (2021)    Anion gap 07/19/2023 9  5 - 15 Final   Performed at [facility], [address] [place], [place] [address]   WBC 07/19/2023 11.3 (H)  4.0 - 10.5 K/uL Final   RBC 07/19/2023 4.04  3.87 - 5.11 MIL/uL Final   Hemoglobin 07/19/2023 11.4 (L)  12.0 - 15.0 g/dL Final   HCT 25/36/6440 35.0 (L)  36.0 - 46.0 % Final   MCV 07/19/2023 86.6  80.0 - 100.0 fL Final   MCH 07/19/2023 28.2  26.0 - 34.0 pg Final   MCHC 07/19/2023 32.6  30.0 - 36.0 g/dL Final   RDW 34/74/2595 13.5  11.5 - 15.5 % Final   Platelets 07/19/2023 279  150 - 400 K/uL Final   nRBC 07/19/2023 0.0  0.0 - 0.2 % Final   Performed at [facility], [address] [place], [place] [address]   Troponin I (High Sensitivity) 07/19/2023 15  <18 ng/L Final   Comment: (NOTE) Elevated high sensitivity troponin I (hsTnI) values and significant  changes across serial measurements may suggest ACS but many other  chronic and acute conditions are known to elevate hsTnI results.  Refer to the "Links" section for chest pain algorithms and additional  guidance. Performed at [facility], [address] [place], [place] [address]    Lactic Acid, Venous 07/19/2023 0.9  0.5 - 1.9 mmol/L Final   Performed at [facility], [address] [place], [place] [address]    Blood Alcohol level:  Lab Results  Component Value Date   Asc Tcg [facility] <15 09/28/2023   ETH <10 07/29/2023    Metabolic Disorder Labs: Lab Results  Component Value Date   HGBA1C 5.5 07/11/2023   MPG 111.15 07/11/2023   MPG 111.15 02/04/2021   Lab Results  Component Value Date   PROLACTIN 39.5 (H) 04/12/2018   PROLACTIN 2.0 12/05/2011   Lab Results  Component Value Date   CHOL 184 08/01/2023   TRIG 153  (H) 08/01/2023   HDL 38 (L) 08/01/2023   CHOLHDL 4.8 08/01/2023   VLDL 31 08/01/2023   LDLCALC 115 (H) 08/01/2023   LDLCALC 109 (H) 09/05/2022    Therapeutic Lab Levels: Lab Results  Component Value Date   LITHIUM  0.65 08/05/2023   LITHIUM  <0.06 (L) 07/29/2023   No results found for: "VALPROATE" No  results found for: "CBMZ"  Physical Findings   AIMS    Flowsheet Row [facility] (Discharged) [facility] Admission (Discharged) from 05/14/2020 in [facility] INPATIENT BEHAVIORAL MEDICINE Admission (Discharged) from OP Visit from 08/08/2019 [facility] Admission (Discharged) from 10/16/2018 in [facility] INPATIENT BEHAVIORAL MEDICINE  AIMS Total Score 0 0 0 0 0      AUDIT    Flowsheet Row Admission (Discharged) from 07/29/2023 in [facility] INPATIENT BEHAVIORAL MEDICINE Admission (Discharged) [facility] Ad[facility] (Discharged) from [facility] Admission (Discharged) from 05/14/2020 in [facility] INPATIENT BEHAVIORAL MEDICINE Admission (Discharged) from 09/25/2019 in [facility] INPATIENT BEHAVIORAL MEDICINE  Alcohol Use Disorder Identification Test Final Score (AUDIT) 0 4 1 3 4       PHQ2-9    Flowsheet Row ED from 09/28/2023 in [facility] Office Visit from 08/21/2023 in [facility] [place]. [facility] Office Visit from 07/07/2023 in [facility] Of [place]. [facility] Office Visit from 06/26/2023 in [facility] - [facility] Of [place]. [facility] Office Visit from 06/22/2023 in [facility] Dept Of [place]. [facility]  PHQ-2 Total Score 4 0 0 0 0  PHQ-9 Total Score 21 0 0 0 0      Flowsheet Row ED from 09/28/2023  in [facility] Most recent reading at 09/28/2023  6:47 AM ED from 09/28/2023 in [facility] Most recent reading at 09/28/2023  6:32 AM ED from 09/25/2023 in [facility] Most recent reading at 09/25/2023  3:12 PM  C-SSRS RISK CATEGORY Moderate Risk Moderate Risk No Risk        Musculoskeletal  Strength & Muscle Tone: within normal limits Gait & Station: normal Patient leans: N/A  Psychiatric Specialty Exam  Presentation  General Appearance:  Appropriate  Eye Contact: Fair  Speech: Spontaneous with normal rate  Speech Volume: Normal  Handedness: Right   Mood and Affect  Mood: Depressed  Affect: Restricted   Thought Process  Thought Processes: Linear and goal directed  Descriptions of Associations: Intact  Orientation:Full (Time, Place and Person)  Thought Content:Rumination; Tangential  Diagnosis of Schizophrenia or Schizoaffective disorder in past: No     Hallucinations:Hallucinations: None  Ideas of Reference:None  Suicidal Thoughts:Suicidal Thoughts: Yes, Passive SI Passive Intent and/or Plan: With Intent; With Plan  Homicidal Thoughts: Denies   Sensorium  Memory: Immediate Fair; Recent Fair; Remote Fair  Judgment: Improving  Insight: Fair   Chartered certified accountant: Fair  Attention Span: Fair  Recall: [employer] of Knowledge: Fair  Language: Fair   Psychomotor Activity  Psychomotor Activity: Psychomotor Activity: Increased   Assets  Assets: Desire for Improvement; Social Support; Resilience   Sleep  Sleep: Improved   Nutritional Assessment (For OBS and FBC admissions only) Has the patient had a weight loss or gain of 10 pounds or more in the last 3 months?: No Has the patient had a decrease in food intake/or appetite?: No Does the patient have dental problems?: No Does the patient have eating habits or behaviors that  may be indicators of an eating disorder including binging or inducing vomiting?: No Has the  patient recently lost weight without trying?: 0 Has the patient been eating poorly because of a decreased appetite?: 0 Malnutrition Screening Tool Score: 0    Physical Exam  Physical Exam Eyes:     Conjunctiva/sclera: Conjunctivae normal.  Pulmonary:     Effort: Pulmonary effort is normal.  Musculoskeletal:        General: Normal range of motion.     Cervical back: Normal range of motion.  Neurological:     Mental Status: She is alert and oriented to person, place, and time.    Review of Systems  Constitutional:  Positive for diaphoresis. Negative for chills and fever.  HENT:  Negative for hearing loss, sore throat and tinnitus.   Eyes:  Negative for blurred vision and double vision.  Respiratory:  Positive for cough. Negative for shortness of breath and wheezing.   Cardiovascular:  Negative for chest pain and palpitations.  Gastrointestinal:  Negative for abdominal pain, constipation, diarrhea, nausea and vomiting.  Genitourinary:  Negative for dysuria, frequency and urgency.  Musculoskeletal:  Negative for myalgias.  Neurological:  Negative for dizziness and headaches.  Psychiatric/Behavioral:  Positive for depression, substance abuse and suicidal ideas. Negative for hallucinations. The patient is nervous/anxious.    Blood pressure 123/76, pulse 71, temperature 98.2 F (36.8 C), temperature source Oral, resp. rate 16, SpO2 97%. There is no height or weight on file to calculate BMI.  Assessment: Kirsten Mateja, is 53 y.o. female with a history of bipolar I disorder, PTSD, suicidal ideation, previous suicide attempts, and past psychiatric hospitalizations who presented voluntarily to [facility] and admitted to [facility] for suicidal ideation.   On assessment, patient continues to require inpatient hospitalization for stabilization of mood symptoms and passive SI.   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

## 2023-09-29 NOTE — ED Notes (Signed)
 Patient  is sleeping comfortably on the bed with eyes closed. NAD, Respirations even and unlabored. Will continue to monitor for safety.

## 2023-09-30 DIAGNOSIS — F4310 Post-traumatic stress disorder, unspecified: Secondary | ICD-10-CM | POA: Diagnosis not present

## 2023-09-30 DIAGNOSIS — R45851 Suicidal ideations: Secondary | ICD-10-CM | POA: Diagnosis not present

## 2023-09-30 DIAGNOSIS — Z5900 Homelessness unspecified: Secondary | ICD-10-CM | POA: Diagnosis not present

## 2023-09-30 DIAGNOSIS — F319 Bipolar disorder, unspecified: Secondary | ICD-10-CM | POA: Diagnosis not present
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
## 2023-09-30 NOTE — ED Provider Notes (Signed)
 Behavioral Health Progress Note  Date and Time: 09/30/2023 1:26 PM Name: Kelly Carr MRN:  [id number]  Subjective:   The patient is a 53 year old female with a history of manic/psychotic episode, recently hospitalized at [facility] for 10 days.  She presented for care on this occasion at the [facility] reporting suicidal thoughts, substance use, and homelessness.  She has been admitted to the facility based crisis for further treatment.  Today the patient talks quite a bit about her difficult social circumstances that she feels led her into increasing depression and substance use.  She denies experiencing any suicidal thoughts.  She is thinking about going to a shelter or an [facility].  Diagnosis:  Final diagnoses:  Homelessness unspecified  Passive suicidal ideations  Ineffective coping  Recurrent major depressive disorder, remission status unspecified (HCC)    Total Time spent with patient: 15 minutes  Past Psychiatric History: See H&P and above Past Medical History: See H&P and above Family History: See H&P and above Family Psychiatric  History: See H&P and above Social History: See H&P and above  Additional Social History:  none  Sleep: fair  Appetite:  fair  Current Medications:  Current Facility-Administered Medications  Medication Dose Route Frequency Provider Last Rate Last Admin   acetaminophen  (TYLENOL ) tablet 650 mg  650 mg Oral Q6H PRN Dorthea Gauze, NP   650 mg at 09/29/23 2125   alum & mag hydroxide-simeth (MAALOX/MYLANTA) 200-200-20 MG/5ML suspension 30 mL  30 mL Oral Q4H PRN Dorthea Gauze, NP       hydrOXYzine  (ATARAX ) tablet 25 mg  25 mg Oral TID PRN Silas Drivers, MD   25 mg at 09/29/23 2126   lithium  carbonate capsule 300 mg  300 mg Oral BID WC Parmar, Meenakshi, MD   300 mg at 09/30/23 0944   magnesium  hydroxide (MILK OF MAGNESIA) suspension 30 mL  30 mL Oral Daily PRN Dorthea Gauze, NP       nicotine  (NICODERM  CQ - dosed in mg/24 hours) patch 21 mg  21 mg Transdermal Daily Parmar, Meenakshi, MD   21 mg at 09/30/23 0945   OLANZapine  (ZYPREXA ) injection 10 mg  10 mg Intramuscular TID PRN Dorthea Gauze, NP       OLANZapine  (ZYPREXA ) injection 5 mg  5 mg Intramuscular TID PRN Dorthea Gauze, NP       OLANZapine  zydis (ZYPREXA ) disintegrating tablet 5 mg  5 mg Oral TID PRN Dorthea Gauze, NP       prazosin  (MINIPRESS ) capsule 1 mg  1 mg Oral QHS Parmar, Meenakshi, MD   1 mg at 09/29/23 2125   traZODone  (DESYREL ) tablet 50 mg  50 mg Oral QHS PRN Parmar, Meenakshi, MD   50 mg at 09/29/23 2125   Current Outpatient Medications  Medication Sig Dispense Refill   albuterol  (VENTOLIN  HFA) 108 (90 Base) MCG/ACT inhaler Inhale 2 puffs into the lungs every 6 (six) hours as needed for wheezing or shortness of breath. 18 g 2   ARIPiprazole  (ABILIFY ) 20 MG tablet Take 20 mg by mouth daily.     cetirizine  (ZYRTEC  ALLERGY) 10 MG tablet Take 1 tablet (10 mg total) by mouth daily. 30 tablet 2   fluticasone  (FLONASE ) 50 MCG/ACT nasal spray Place 2 sprays into both nostrils daily for 15 days. 16 g 0   hydrOXYzine  (ATARAX ) 25 MG tablet Take 25 mg by mouth 3 (three) times daily as needed for anxiety.     prazosin  (MINIPRESS ) 1 MG capsule Take 1  capsule (1 mg total) by mouth at bedtime. 30 capsule 0   fluPHENAZine  (PROLIXIN ) 10 MG tablet Take 1 tablet (10 mg total) by mouth at bedtime. (Patient not taking: Reported on 09/28/2023) 30 tablet 0   traZODone  (DESYREL ) 50 MG tablet Take 1 tablet (50 mg total) by mouth at bedtime. (Patient not taking: Reported on 09/28/2023) 30 tablet 0    Labs  Lab Results:  Admission on 09/28/2023  Component Date Value Ref Range Status   SARS Coronavirus 2 by RT PCR 09/28/2023 NEGATIVE  NEGATIVE Final   Performed at [facility], [address], [place], [place] [address]   WBC 09/28/2023 8.6  4.0 - 10.5 K/uL Final   RBC 09/28/2023 4.52  3.87 - 5.11 MIL/uL Final   Hemoglobin 09/28/2023 12.6   12.0 - 15.0 g/dL Final   HCT 47/82/9562 39.3  36.0 - 46.0 % Final   MCV 09/28/2023 86.9  80.0 - 100.0 fL Final   MCH 09/28/2023 27.9  26.0 - 34.0 pg Final   MCHC 09/28/2023 32.1  30.0 - 36.0 g/dL Final   RDW 13/01/6577 13.2  11.5 - 15.5 % Final   Platelets 09/28/2023 268  150 - 400 K/uL Final   nRBC 09/28/2023 0.0  0.0 - 0.2 % Final   Neutrophils Relative % 09/28/2023 60  % Final   Neutro Abs 09/28/2023 5.2  1.7 - 7.7 K/uL Final   Lymphocytes Relative 09/28/2023 30  % Final   Lymphs Abs 09/28/2023 2.6  0.7 - 4.0 K/uL Final   Monocytes Relative 09/28/2023 8  % Final   Monocytes Absolute 09/28/2023 0.7  0.1 - 1.0 K/uL Final   Eosinophils Relative 09/28/2023 1  % Final   Eosinophils Absolute 09/28/2023 0.1  0.0 - 0.5 K/uL Final   Basophils Relative 09/28/2023 1  % Final   Basophils Absolute 09/28/2023 0.0  0.0 - 0.1 K/uL Final   Immature Granulocytes 09/28/2023 0  % Final   Abs Immature Granulocytes 09/28/2023 0.03  0.00 - 0.07 K/uL Final   Performed at [facility], [address], [place], [place] [address]   Sodium 09/28/2023 140  135 - 145 mmol/L Final   Potassium 09/28/2023 3.6  3.5 - 5.1 mmol/L Final   Chloride 09/28/2023 106  98 - 111 mmol/L Final   CO2 09/28/2023 26  22 - 32 mmol/L Final   Glucose, Bld 09/28/2023 101 (H)  70 - 99 mg/dL Final   Glucose reference range applies only to samples taken after fasting for at least 8 hours.   BUN 09/28/2023 6  6 - 20 mg/dL Final   Creatinine, Ser 09/28/2023 0.63  0.44 - 1.00 mg/dL Final   Calcium  09/28/2023 9.3  8.9 - 10.3 mg/dL Final   Total Protein 95/28/4132 6.4 (L)  6.5 - 8.1 g/dL Final   Albumin 44/06/270 3.6  3.5 - 5.0 g/dL Final   AST 53/66/4403 21  15 - 41 U/L Final   ALT 09/28/2023 27  0 - 44 U/L Final   Alkaline Phosphatase 09/28/2023 74  38 - 126 U/L Final   Total Bilirubin 09/28/2023 0.9  0.0 - 1.2 mg/dL Final   GFR, Estimated 09/28/2023 >60  >60 mL/min Final   Comment: (NOTE) Calculated using the CKD-EPI  Creatinine Equation (2021)    Anion gap 09/28/2023 8  5 - 15 Final   Performed at [facility], [address] [place], [place] [address]   Alcohol, Ethyl (B) 09/28/2023 <15  <15 mg/dL  Final   Comment: Please note change in reference range. (NOTE) For medical purposes only. Performed at [facility], [address], [place], [place] [address]    TSH 09/28/2023 2.425  0.350 - 4.500 uIU/mL Final   Comment: Performed by a 3rd Generation assay with a functional sensitivity of <=0.01 uIU/mL. Performed at [facility], [address] [place], [place] [address]    Preg Test, Ur 09/28/2023 Negative  Negative Final   POC Amphetamine UR 09/28/2023 None Detected  NONE DETECTED (Cut Off Level 1000 ng/mL) Final   POC Secobarbital (BAR) 09/28/2023 None Detected  NONE DETECTED (Cut Off Level 300 ng/mL) Final   POC Buprenorphine (BUP) 09/28/2023 None Detected  NONE DETECTED (Cut Off Level 10 ng/mL) Final   POC Oxazepam (BZO) 09/28/2023 None Detected  NONE DETECTED (Cut Off Level 300 ng/mL) Final   POC Cocaine UR 09/28/2023 Positive (A)  NONE DETECTED (Cut Off Level 300 ng/mL) Final   POC Methamphetamine UR 09/28/2023 None Detected  NONE DETECTED (Cut Off Level 1000 ng/mL) Final   POC Morphine 09/28/2023 None Detected  NONE DETECTED (Cut Off Level 300 ng/mL) Final   POC Methadone UR 09/28/2023 None Detected  NONE DETECTED (Cut Off Level 300 ng/mL) Final   POC Oxycodone  UR 09/28/2023 None Detected  NONE DETECTED (Cut Off Level 100 ng/mL) Final   POC Marijuana UR 09/28/2023 Positive (A)  NONE DETECTED (Cut Off Level 50 ng/mL) Final   Preg Test, Ur 09/28/2023 NEGATIVE  NEGATIVE Final   Comment:        THE SENSITIVITY OF THIS METHODOLOGY IS >24 mIU/mL   Admission on 09/25/2023, Discharged on 09/25/2023  Component Date Value Ref Range Status   Preg Test, Ur 09/25/2023 Negative  Negative Final   Neisseria Gonorrhea 09/25/2023 Negative   Final   Chlamydia 09/25/2023 Positive (A)    Final   Trichomonas 09/25/2023 Negative   Final   Bacterial Vaginitis (gardnerella) 09/25/2023 Positive (A)   Final   Candida Vaginitis 09/25/2023 Negative   Final   Candida Glabrata 09/25/2023 Negative   Final   Comment 09/25/2023 Normal Reference Range Bacterial Vaginosis - Negative   Final   Comment 09/25/2023 Normal Reference Range Candida Species - Negative   Final   Comment 09/25/2023 Normal Reference Range Candida Galbrata - Negative   Final   Comment 09/25/2023 Normal Reference Range Trichomonas - Negative   Final   Comment 09/25/2023 Normal Reference Ranger Chlamydia - Negative   Final   Comment 09/25/2023 Normal Reference Range Neisseria Gonorrhea - Negative   Final   HIV Screen 4th Generation wRfx 09/25/2023 Non Reactive  Non Reactive Final   Performed at [facility], [address], [place], [place] [address]   RPR Ser Ql 09/25/2023 NON REACTIVE  NON REACTIVE Final   Performed at [facility], [address], [place], [place] [address]  Admission on 07/29/2023, Discharged on 08/08/2023  Component Date Value Ref Range Status   Cholesterol 08/01/2023 184  0 - 200 mg/dL Final   Triglycerides 29/56/2130 153 (H)  <150 mg/dL Final   HDL 86/57/8469 38 (L)  >40 mg/dL Final   Total CHOL/HDL Ratio 08/01/2023 4.8  RATIO Final   VLDL 08/01/2023 31  0 - 40 mg/dL Final   LDL Cholesterol 08/01/2023 115 (H)  0 - 99 mg/dL Final   Comment:        Total Cholesterol/HDL:CHD Risk Coronary Heart Disease Risk Table  Men   Women  1/2 Average Risk   3.4   3.3  Average Risk       5.0   4.4  2 X Average Risk   9.6   7.1  3 X Average Risk  23.4   11.0        Use the calculated Patient Ratio above and the CHD Risk Table to determine the patient's CHD Risk.        ATP III CLASSIFICATION (LDL):  <100     mg/dL   Optimal  829-562  mg/dL   Near or Above                    Optimal  130-159  mg/dL   Borderline  130-865  mg/dL   High  >784     mg/dL   Very  High Performed at [facility], [address] [place], [place] [address]    Lithium  Lvl 08/05/2023 0.65  0.60 - 1.20 mmol/L Final   Performed at [facility], [address] [place], [place] [address]  Admission on 07/29/2023, Discharged on 07/29/2023  Component Date Value Ref Range Status   Sodium 07/29/2023 137  135 - 145 mmol/L Final   Potassium 07/29/2023 3.5  3.5 - 5.1 mmol/L Final   Chloride 07/29/2023 103  98 - 111 mmol/L Final   CO2 07/29/2023 21 (L)  22 - 32 mmol/L Final   Glucose, Bld 07/29/2023 112 (H)  70 - 99 mg/dL Final   Glucose reference range applies only to samples taken after fasting for at least 8 hours.   BUN 07/29/2023 11  6 - 20 mg/dL Final   Creatinine, Ser 07/29/2023 0.57  0.44 - 1.00 mg/dL Final   Calcium  07/29/2023 8.7 (L)  8.9 - 10.3 mg/dL Final   Total Protein 32/44/0102 6.8  6.5 - 8.1 g/dL Final   Albumin 72/53/6644 3.8  3.5 - 5.0 g/dL Final   AST 03/47/4259 41  15 - 41 U/L Final   ALT 07/29/2023 54 (H)  0 - 44 U/L Final   Alkaline Phosphatase 07/29/2023 76  38 - 126 U/L Final   Total Bilirubin 07/29/2023 0.8  0.0 - 1.2 mg/dL Final   GFR, Estimated 07/29/2023 >60  >60 mL/min Final   Comment: (NOTE) Calculated using the CKD-EPI Creatinine Equation (2021)    Anion gap 07/29/2023 13  5 - 15 Final   Performed at [facility], [address] [place], [place] [address]   Alcohol, Ethyl (B) 07/29/2023 <10  <10 mg/dL Final   Comment: (NOTE) Lowest detectable limit for serum alcohol is 10 mg/dL.  For medical purposes only. Performed at [facility], [address] [place], [place] [address]    Salicylate Lvl 07/29/2023 <7.0 (L)  7.0 - 30.0 mg/dL Final   Performed at [facility], [address] [place], [place] [address]   Acetaminophen  (Tylenol ), Serum 07/29/2023 <10 (L)  10 - 30 ug/mL Final   Comment: (NOTE) Therapeutic concentrations vary significantly. A range of 10-30 ug/mL  may be an effective  concentration for many patients. However, some  are best treated at concentrations outside of this range. Acetaminophen  concentrations >150 ug/mL at 4 hours after ingestion  and >50 ug/mL at 12 hours after ingestion are often associated with  toxic reactions.  Performed at [facility], [address] [place], [place] [address]    WBC 07/29/2023 6.7  4.0 - 10.5 K/uL Final   RBC 07/29/2023  4.15  3.87 - 5.11 MIL/uL Final   Hemoglobin 07/29/2023 11.8 (L)  12.0 - 15.0 g/dL Final   HCT 16/03/9603 36.5  36.0 - 46.0 % Final   MCV 07/29/2023 88.0  80.0 - 100.0 fL Final   MCH 07/29/2023 28.4  26.0 - 34.0 pg Final   MCHC 07/29/2023 32.3  30.0 - 36.0 g/dL Final   RDW 54/02/8118 13.6  11.5 - 15.5 % Final   Platelets 07/29/2023 327  150 - 400 K/uL Final   nRBC 07/29/2023 0.0  0.0 - 0.2 % Final   Performed at [facility], [address] [place], [place] [address]   Tricyclic, Ur Screen 07/29/2023 POSITIVE (A)  NONE DETECTED Final   Amphetamines, Ur Screen 07/29/2023 NONE DETECTED  NONE DETECTED Final   MDMA (Ecstasy)Ur Screen 07/29/2023 NONE DETECTED  NONE DETECTED Final   Cocaine Metabolite,Ur [place] 07/29/2023 NONE DETECTED  NONE DETECTED Final   Opiate, Ur Screen 07/29/2023 NONE DETECTED  NONE DETECTED Final   Phencyclidine (PCP) Ur S 07/29/2023 NONE DETECTED  NONE DETECTED Final   Cannabinoid 50 Ng, Ur [place] 07/29/2023 POSITIVE (A)  NONE DETECTED Final   Barbiturates, Ur Screen 07/29/2023 NONE DETECTED  NONE DETECTED Final   Benzodiazepine, Ur Scrn 07/29/2023 POSITIVE (A)  NONE DETECTED Final   Methadone Scn, Ur 07/29/2023 NONE DETECTED  NONE DETECTED Final   Comment: (NOTE) Tricyclics + metabolites, urine    Cutoff 1000 ng/mL Amphetamines + metabolites, urine  Cutoff 1000 ng/mL MDMA (Ecstasy), urine              Cutoff 500 ng/mL Cocaine Metabolite, urine          Cutoff 300 ng/mL Opiate + metabolites, urine        Cutoff 300 ng/mL Phencyclidine (PCP), urine         Cutoff  25 ng/mL Cannabinoid, urine                 Cutoff 50 ng/mL Barbiturates + metabolites, urine  Cutoff 200 ng/mL Benzodiazepine, urine              Cutoff 200 ng/mL Methadone, urine                   Cutoff 300 ng/mL  The urine drug screen provides only a preliminary, unconfirmed analytical test result and should not be used for non-medical purposes. Clinical consideration and professional judgment should be applied to any positive drug screen result due to possible interfering substances. A more specific alternate chemical method must be used in order to obtain a confirmed analytical result. Gas chromatography / mass spectrometry (GC/MS) is the preferred confirm                          atory method. Performed at [facility], [address] [place], [place] [address]    Lithium  Lvl 07/29/2023 <0.06 (L)  0.60 - 1.20 mmol/L Final   Performed at [facility], [address] [place], [place] [address]  Admission on 07/19/2023, Discharged on 07/19/2023  Component Date Value Ref Range Status   WBC 07/19/2023 14.7 (H)  4.0 - 10.5 K/uL Final   RBC 07/19/2023 4.76  3.87 - 5.11 MIL/uL Final   Hemoglobin 07/19/2023 13.6  12.0 - 15.0 g/dL Final   HCT 78/46/9629 43.1  36.0 - 46.0 % Final   MCV 07/19/2023 90.5  80.0 - 100.0 fL Final   MCH 07/19/2023 28.6  26.0 -  34.0 pg Final   MCHC 07/19/2023 31.6  30.0 - 36.0 g/dL Final   RDW 40/98/1191 13.6  11.5 - 15.5 % Final   Platelets 07/19/2023 378  150 - 400 K/uL Final   nRBC 07/19/2023 0.0  0.0 - 0.2 % Final   Neutrophils Relative % 07/19/2023 80  % Final   Neutro Abs 07/19/2023 11.7 (H)  1.7 - 7.7 K/uL Final   Lymphocytes Relative 07/19/2023 13  % Final   Lymphs Abs 07/19/2023 2.0  0.7 - 4.0 K/uL Final   Monocytes Relative 07/19/2023 6  % Final   Monocytes Absolute 07/19/2023 0.8  0.1 - 1.0 K/uL Final   Eosinophils Relative 07/19/2023 0  % Final   Eosinophils Absolute 07/19/2023 0.0  0.0 - 0.5 K/uL Final   Basophils  Relative 07/19/2023 0  % Final   Basophils Absolute 07/19/2023 0.0  0.0 - 0.1 K/uL Final   Immature Granulocytes 07/19/2023 1  % Final   Abs Immature Granulocytes 07/19/2023 0.07  0.00 - 0.07 K/uL Final   Performed at [facility], [address] [place], [place] [address]   Sodium 07/19/2023 142  135 - 145 mmol/L Final   Potassium 07/19/2023 4.6  3.5 - 5.1 mmol/L Final   Chloride 07/19/2023 107  98 - 111 mmol/L Final   CO2 07/19/2023 15 (L)  22 - 32 mmol/L Final   Glucose, Bld 07/19/2023 113 (H)  70 - 99 mg/dL Final   Glucose reference range applies only to samples taken after fasting for at least 8 hours.   BUN 07/19/2023 8  6 - 20 mg/dL Final   Creatinine, Ser 07/19/2023 0.71  0.44 - 1.00 mg/dL Final   Calcium  07/19/2023 9.5  8.9 - 10.3 mg/dL Final   Total Protein 56/21/3086 7.8  6.5 - 8.1 g/dL Final   Albumin 57/84/6962 4.2  3.5 - 5.0 g/dL Final   AST 95/28/4132 44 (H)  15 - 41 U/L Final   ALT 07/19/2023 49 (H)  0 - 44 U/L Final   Alkaline Phosphatase 07/19/2023 82  38 - 126 U/L Final   Total Bilirubin 07/19/2023 0.8  0.0 - 1.2 mg/dL Final   GFR, Estimated 07/19/2023 >60  >60 mL/min Final   Comment: (NOTE) Calculated using the CKD-EPI Creatinine Equation (2021)    Anion gap 07/19/2023 20 (H)  5 - 15 Final   Performed at [facility], [address] [place], [place] [address]   Troponin I (High Sensitivity) 07/19/2023 6  <18 ng/L Final   Comment: (NOTE) Elevated high sensitivity troponin I (hsTnI) values and significant  changes across serial measurements may suggest ACS but many other  chronic and acute conditions are known to elevate hsTnI results.  Refer to the "Links" section for chest pain algorithms and additional  guidance. Performed at [facility], [address] [place], [place] [address]    Color, Urine 07/19/2023 YELLOW (A)  YELLOW Final   APPearance 07/19/2023 CLEAR (A)  CLEAR Final   Specific Gravity, Urine 07/19/2023 1.008   1.005 - 1.030 Final   pH 07/19/2023 6.0  5.0 - 8.0 Final   Glucose, UA 07/19/2023 NEGATIVE  NEGATIVE mg/dL Final   Hgb urine dipstick 07/19/2023 NEGATIVE  NEGATIVE Final   Bilirubin Urine 07/19/2023 NEGATIVE  NEGATIVE Final   Ketones, ur 07/19/2023 5 (A)  NEGATIVE mg/dL Final   Protein, ur 66/44/0347 NEGATIVE  NEGATIVE mg/dL Final   Nitrite 42/59/5638 NEGATIVE  NEGATIVE Final   Leukocytes,Ua 07/19/2023 NEGATIVE  NEGATIVE  Final   Performed at [facility], [address] [place], [place] [address]   Tricyclic, Ur Screen 07/19/2023 NONE DETECTED  NONE DETECTED Final   Amphetamines, Ur Screen 07/19/2023 NONE DETECTED  NONE DETECTED Final   MDMA (Ecstasy)Ur Screen 07/19/2023 NONE DETECTED  NONE DETECTED Final   Cocaine Metabolite,Ur [place] 07/19/2023 NONE DETECTED  NONE DETECTED Final   Opiate, Ur Screen 07/19/2023 POSITIVE (A)  NONE DETECTED Final   Phencyclidine (PCP) Ur S 07/19/2023 NONE DETECTED  NONE DETECTED Final   Cannabinoid 50 Ng, Ur [place] 07/19/2023 POSITIVE (A)  NONE DETECTED Final   Barbiturates, Ur Screen 07/19/2023 NONE DETECTED  NONE DETECTED Final   Benzodiazepine, Ur Scrn 07/19/2023 NONE DETECTED  NONE DETECTED Final   Methadone Scn, Ur 07/19/2023 NONE DETECTED  NONE DETECTED Final   Comment: (NOTE) Tricyclics + metabolites, urine    Cutoff 1000 ng/mL Amphetamines + metabolites, urine  Cutoff 1000 ng/mL MDMA (Ecstasy), urine              Cutoff 500 ng/mL Cocaine Metabolite, urine          Cutoff 300 ng/mL Opiate + metabolites, urine        Cutoff 300 ng/mL Phencyclidine (PCP), urine         Cutoff 25 ng/mL Cannabinoid, urine                 Cutoff 50 ng/mL Barbiturates + metabolites, urine  Cutoff 200 ng/mL Benzodiazepine, urine              Cutoff 200 ng/mL Methadone, urine                   Cutoff 300 ng/mL  The urine drug screen provides only a preliminary, unconfirmed analytical test result and should not be used for non-medical purposes. Clinical consideration  and professional judgment should be applied to any positive drug screen result due to possible interfering substances. A more specific alternate chemical method must be used in order to obtain a confirmed analytical result. Gas chromatography / mass spectrometry (GC/MS) is the preferred confirm                          atory method. Performed at [facility], [address] [place], [place] [address]    Glucose-Capillary 07/19/2023 110 (H)  70 - 99 mg/dL Final   Glucose reference range applies only to samples taken after fasting for at least 8 hours.   Specimen Description 07/19/2023 BLOOD LEFT ASSIST CONTROL   Final   Special Requests 07/19/2023 BOTTLES DRAWN AEROBIC AND ANAEROBIC Blood Culture adequate volume   Final   Culture 07/19/2023    Final                   Value:NO GROWTH 5 DAYS Performed at [facility], [address] [place], [place] [address]    Report Status 07/19/2023 07/24/2023 FINAL   Final   Specimen Description 07/19/2023 BLOOD BLOOD RIGHT HAND   Final   Special Requests 07/19/2023 AEROBIC BOTTLE ONLY Blood Culture results may not be optimal due to an inadequate volume of blood received in culture bottles   Final   Culture 07/19/2023    Final                   Value:NO GROWTH 5 DAYS Performed at [facility], [address] [place], [place] [address]    Report Status 07/19/2023  07/24/2023 FINAL   Final   Lactic Acid, Venous 07/19/2023 6.3 (HH)  0.5 - 1.9 mmol/L Final   Comment: CRITICAL RESULT CALLED TO, READ BACK BY AND VERIFIED WITH  ALEX [facility] AT 0250 07/19/23 JG Performed at [facility], [address] [place], [place] [address]    Lactic Acid, Venous 07/19/2023 1.3  0.5 - 1.9 mmol/L Final   Performed at [facility], [address] [place], [place] [address]   Total CK 07/19/2023 822 (H)  38 - 234 U/L Final   Performed at [facility], [address] [place], [place] [address]   Sodium  07/19/2023 138  135 - 145 mmol/L Final   Potassium 07/19/2023 4.5  3.5 - 5.1 mmol/L Final   Chloride 07/19/2023 106  98 - 111 mmol/L Final   CO2 07/19/2023 23  22 - 32 mmol/L Final   Glucose, Bld 07/19/2023 93  70 - 99 mg/dL Final   Glucose reference range applies only to samples taken after fasting for at least 8 hours.   BUN 07/19/2023 9  6 - 20 mg/dL Final   Creatinine, Ser 07/19/2023 0.59  0.44 - 1.00 mg/dL Final   Calcium  07/19/2023 8.4 (L)  8.9 - 10.3 mg/dL Final   GFR, Estimated 07/19/2023 >60  >60 mL/min Final   Comment: (NOTE) Calculated using the CKD-EPI Creatinine Equation (2021)    Anion gap 07/19/2023 9  5 - 15 Final   Performed at [facility], [address] [place], [place] [address]   WBC 07/19/2023 11.3 (H)  4.0 - 10.5 K/uL Final   RBC 07/19/2023 4.04  3.87 - 5.11 MIL/uL Final   Hemoglobin 07/19/2023 11.4 (L)  12.0 - 15.0 g/dL Final   HCT 32/95/1884 35.0 (L)  36.0 - 46.0 % Final   MCV 07/19/2023 86.6  80.0 - 100.0 fL Final   MCH 07/19/2023 28.2  26.0 - 34.0 pg Final   MCHC 07/19/2023 32.6  30.0 - 36.0 g/dL Final   RDW 16/60/6301 13.5  11.5 - 15.5 % Final   Platelets 07/19/2023 279  150 - 400 K/uL Final   nRBC 07/19/2023 0.0  0.0 - 0.2 % Final   Performed at [facility], [address] [place], [place] [address]   Troponin I (High Sensitivity) 07/19/2023 15  <18 ng/L Final   Comment: (NOTE) Elevated high sensitivity troponin I (hsTnI) values and significant  changes across serial measurements may suggest ACS but many other  chronic and acute conditions are known to elevate hsTnI results.  Refer to the "Links" section for chest pain algorithms and additional  guidance. Performed at [facility], [address] [place], [place] [address]    Lactic Acid, Venous 07/19/2023 0.9  0.5 - 1.9 mmol/L Final   Performed at [facility], [address] [place], [place] [address]    Blood Alcohol level:  Lab Results  Component  Value Date   [facility] <15 09/28/2023   ETH <10 07/29/2023    Metabolic Disorder Labs: Lab Results  Component Value Date   HGBA1C 5.5 07/11/2023   MPG 111.15 07/11/2023   MPG 111.15 02/04/2021   Lab Results  Component Value Date   PROLACTIN 39.5 (H) 04/12/2018   PROLACTIN 2.0 12/05/2011   Lab Results  Component Value Date   CHOL 184 08/01/2023   TRIG 153 (H) 08/01/2023   HDL 38 (L) 08/01/2023   CHOLHDL 4.8 08/01/2023   VLDL 31 08/01/2023   LDLCALC 115 (H)  08/01/2023   LDLCALC 109 (H) 09/05/2022    Therapeutic Lab Levels: Lab Results  Component Value Date   LITHIUM  0.65 08/05/2023   LITHIUM  <0.06 (L) 07/29/2023   No results found for: "VALPROATE" No results found for: "CBMZ"  Physical Findings   AIMS    Flowsheet Row [facility]) [facility] Admission (Discharged) from 05/14/2020 in [facility] INPATIENT BE[facility]AV[facility]AL MEDICINE Admission (Discharged) from OP Visit from 08/08/2019 [facility] Admission (Discharged) from 10/16/2018 in [facility] INPATIENT BEHAVIORAL MEDICINE  AIMS Total Score 0 0 0 0 0      AUDIT    Flowsheet Row Admission (Discharged) from 07/29/2023 in [facility] INPATIENT BEHAVIORAL MEDICINE Admission (Discharged) [facility] (Discharged) [facility] Admission (Discharged) from 05/14/2020 in [facility] INPATIENT BEHAVIORAL MEDICINE Admission (Discharged) from 09/25/2019 in [facility] INPATIENT BEHAVIORAL MEDICINE  Alcohol Use Disorder Identification Test Final Score (AUDIT) 0 4 1 3 4       PHQ2-9    Flowsheet Row ED from 09/28/2023 in [facility] Office Visit from 08/21/2023 in [facility] [place] Office Visit from 07/07/2023 in [facility] - [facility] Dept Of [place]. [facility] Office Visit from 06/26/2023 in [facility] - A Dept Of [place]. [facility] Office Visit from 06/22/2023 in [facility] - A Dept Of [place]. [facility]  PHQ-2 Total Score 4 0 0 0 0  PHQ-9 Total Score 21 0 0 0 0      Flowsheet Row ED from 09/28/2023 in [facility] Most recent reading at 09/28/2023  6:47 AM ED from 09/28/2023 in [facility] Most recent reading at 09/28/2023  6:32 AM ED from 09/25/2023 in [facility] [place] Most recent reading at 09/25/2023  3:12 PM  C-SSRS RISK CATEGORY Moderate Risk Moderate Risk No Risk        Psychiatric Specialty Exam: Physical Exam Constitutional:      Appearance: the patient is not toxic-appearing.  Pulmonary:     Effort: Pulmonary effort is normal.  Neurological:     General: No focal deficit present.     Mental Status: the patient is alert and oriented to person, place, and time.   Review of Systems  Respiratory:  Negative for shortness of breath.   Cardiovascular:  Negative for chest pain.  Gastrointestinal:  Negative for abdominal pain, constipation, diarrhea, nausea and vomiting.  Neurological:  Negative for headaches.      BP 133/78   Pulse 98   Temp 98 F (36.7 C) (Oral)   Resp 18   SpO2 99%   General Appearance: Fairly Groomed  Eye Contact:  Good  Speech:  Clear and Coherent  Volume:  Normal  Mood:  Euthymic  Affect:  Congruent  Thought Process:  Coherent  Orientation:  Full (Time, Place, and Person)  Thought Content: Logical   Suicidal Thoughts:  No  Homicidal Thoughts:  No  Memory:  Immediate;   Good  Judgement:  fair  Insight:  fair  Psychomotor Activity:  Normal  Concentration:  Concentration: Good  Recall:  Good  Fund of Knowledge: Good  Language: Good  Akathisia:  No  Handed:  not assessed  AIMS (if indicated): not done   Assets:  Communication Skills Desire for Improvement Leisure Time Physical Health  ADL's:  Intact  Cognition: WNL  Sleep:  Fair    Treatment Plan Summary: Daily contact with patient to assess and evaluate symptoms and progress in treatment and Medication management.  Cocaine abuse - Residential rehab placement  Bipolar disorder, possible cluster B personality traits - Continue lithium  300 mg twice daily - Lithium  level ordered - Continue prazosin  1 mg nightly - Prolixin  from last hospitalization not continued, does not seem necessary currently  EKG and lab work unremarkable.   Marilou Showman, MD 09/30/2023 1:26 PM

## 2023-09-30 NOTE — ED Notes (Addendum)
 Patient is sitting in the dayroom room watching TV with other patients. Calm and pleasant. NAD respirations are even and unlabored.Will continue to monitor for safety.
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
## 2023-09-30 NOTE — ED Notes (Signed)
 Patient is in the bedroom sleeping with eyes closed, NAD. Will continue to monitor for safety.
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
## 2023-09-30 NOTE — ED Notes (Signed)
 Pt sleeping at present, no distress noted.  Monitoring for safety.

## 2023-10-01 DIAGNOSIS — F4310 Post-traumatic stress disorder, unspecified: Secondary | ICD-10-CM | POA: Diagnosis not present

## 2023-10-01 DIAGNOSIS — F319 Bipolar disorder, unspecified: Secondary | ICD-10-CM | POA: Diagnosis not present

## 2023-10-01 DIAGNOSIS — Z5900 Homelessness unspecified: Secondary | ICD-10-CM | POA: Diagnosis not present

## 2023-10-01 DIAGNOSIS — R45851 Suicidal ideations: Secondary | ICD-10-CM | POA: Diagnosis not present

## 2023-10-01 MED ORDER — HYDROCORTISONE 1 % EX CREA
TOPICAL_CREAM | Freq: Two times a day (BID) | CUTANEOUS | Status: DC | PRN
  Filled 2023-10-01: qty 28

## 2023-10-01 MED ORDER — LORATADINE 10 MG PO TABS
10.0000 mg | ORAL_TABLET | Freq: Every day | ORAL | Status: DC
  Administered 2023-10-02 – 2023-10-04 (×3): 10 mg via ORAL
  Filled 2023-10-01 (×3): qty 1
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
## 2023-10-01 NOTE — ED Notes (Signed)
 Patient appeared to be sleeping, respirations present, no distress.

## 2023-10-01 NOTE — ED Notes (Signed)
 Pt sitting in dayroom watching television and interacting with another peer. No acute distress noted. No concerns voiced. Informed pt to notify staff with any needs or assistance. Pt verbalized understanding and agreement. Will continue to monitor for safety.

## 2023-10-01 NOTE — ED Notes (Signed)
 Pt sitting in courtyard interacting with peers and staff. No acute distress noted. No concerns voiced. Informed pt to notify staff with any needs or assistance. Pt verbalized understanding and agreement. Will continue to monitor for safety.

## 2023-10-01 NOTE — ED Notes (Signed)
 Pt came from outside courtyard requesting writer look at her legs. Writer observed reddened patterns on bil thighs to knees. Pt described tingling sensation to bil areas. Provider notified via secure chat. Immediate response given from provider. Provider came to unit to assess areas. Pt reports clothing washed last night and pt recently showered. Dr. Rosalina Colt ordered hydrocortisone  cream for areas assessed. Upon provider coming to unit, areas on bil thighs were already fading out. Medication ordered. Will apply to areas when available. Pt denies discomfort at present. Will continue to  monitor for safety.

## 2023-10-01 NOTE — ED Notes (Signed)
 Pt is in the bedroom sleeping comfortably on the bed. NAD. environment secured.  Will keep monitoring for safety
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
## 2023-10-01 NOTE — ED Notes (Signed)
Patient A&Ox4. Denies intent to harm self/others when asked. Denies A/VH. Patient denies any physical complaints when asked. No acute distress noted. Routine safety checks conducted according to facility protocol. Encouraged patient to notify staff if thoughts of harm toward self or others arise. Patient verbalize understanding and agreement. Will continue to monitor for safety.    

## 2023-10-01 NOTE — ED Provider Notes (Signed)
 Behavioral Health Progress Note  Date and Time: 10/01/2023 10:34 AM Name: Kelly Carr MRN:  [id number]  Subjective:   The patient is a 53 year old female with a history of manic/psychotic episode, recently hospitalized at [facility] for 10 days.  She presented for care on this occasion at the [facility] reporting suicidal thoughts, substance use, and homelessness.  She has been admitted to the facility based crisis for further treatment.  Today the patient reports she is doing better.  She found a conversation with one of the nurses helpful.  She was encouraged to make plans for when she is discharged in the coming days.  We discussed her getting a lithium  level.  The patient denies experiencing any suicidal thoughts.  Diagnosis:  Final diagnoses:  Homelessness unspecified  Passive suicidal ideations  Ineffective coping  Recurrent major depressive disorder, remission status unspecified (HCC)    Total Time spent with patient: 15 minutes  Past Psychiatric History: See H&P and above Past Medical History: See H&P and above Family History: See H&P and above Family Psychiatric  History: See H&P and above Social History: See H&P and above  Additional Social History:  none  Sleep: fair  Appetite:  fair  Current Medications:  Current Facility-Administered Medications  Medication Dose Route Frequency Provider Last Rate Last Admin   acetaminophen  (TYLENOL ) tablet 650 mg  650 mg Oral Q6H PRN Dorthea Gauze, NP   650 mg at 09/29/23 2125   alum & mag hydroxide-simeth (MAALOX/MYLANTA) 200-200-20 MG/5ML suspension 30 mL  30 mL Oral Q4H PRN Dorthea Gauze, NP   30 mL at 09/30/23 1606   hydrOXYzine  (ATARAX ) tablet 25 mg  25 mg Oral TID PRN Silas Drivers, MD   25 mg at 09/30/23 2122   lithium  carbonate capsule 300 mg  300 mg Oral BID WC Parmar, Meenakshi, MD   300 mg at 10/01/23 0900   magnesium  hydroxide (MILK OF MAGNESIA) suspension 30 mL  30 mL Oral  Daily PRN Dorthea Gauze, NP       nicotine  (NICODERM CQ  - dosed in mg/24 hours) patch 21 mg  21 mg Transdermal Daily Silas Drivers, MD   21 mg at 10/01/23 2130   OLANZapine  (ZYPREXA ) injection 10 mg  10 mg Intramuscular TID PRN Dorthea Gauze, NP       OLANZapine  (ZYPREXA ) injection 5 mg  5 mg Intramuscular TID PRN Dorthea Gauze, NP       OLANZapine  zydis (ZYPREXA ) disintegrating tablet 5 mg  5 mg Oral TID PRN Dorthea Gauze, NP       prazosin  (MINIPRESS ) capsule 1 mg  1 mg Oral QHS Parmar, Meenakshi, MD   1 mg at 09/30/23 2121   traZODone  (DESYREL ) tablet 50 mg  50 mg Oral QHS PRN Parmar, Meenakshi, MD   50 mg at 09/30/23 2121   Current Outpatient Medications  Medication Sig Dispense Refill   albuterol  (VENTOLIN  HFA) 108 (90 Base) MCG/ACT inhaler Inhale 2 puffs into the lungs every 6 (six) hours as needed for wheezing or shortness of breath. 18 g 2   ARIPiprazole  (ABILIFY ) 20 MG tablet Take 20 mg by mouth daily.     cetirizine  (ZYRTEC  ALLERGY) 10 MG tablet Take 1 tablet (10 mg total) by mouth daily. 30 tablet 2   fluticasone  (FLONASE ) 50 MCG/ACT nasal spray Place 2 sprays into both nostrils daily for 15 days. 16 g 0   hydrOXYzine  (ATARAX ) 25 MG tablet Take 25 mg by mouth 3 (three) times daily as needed for anxiety.  prazosin  (MINIPRESS ) 1 MG capsule Take 1 capsule (1 mg total) by mouth at bedtime. 30 capsule 0   fluPHENAZine  (PROLIXIN ) 10 MG tablet Take 1 tablet (10 mg total) by mouth at bedtime. (Patient not taking: Reported on 09/28/2023) 30 tablet 0   traZODone  (DESYREL ) 50 MG tablet Take 1 tablet (50 mg total) by mouth at bedtime. (Patient not taking: Reported on 09/28/2023) 30 tablet 0    Labs  Lab Results:  Admission on 09/28/2023  Component Date Value Ref Range Status   SARS Coronavirus 2 by RT PCR 09/28/2023 NEGATIVE  NEGATIVE Final   Performed at [facility], [address], [place], [place] [address]   WBC 09/28/2023 8.6  4.0 - 10.5 K/uL Final   RBC 09/28/2023 4.52   3.87 - 5.11 MIL/uL Final   Hemoglobin 09/28/2023 12.6  12.0 - 15.0 g/dL Final   HCT 60/45/4098 39.3  36.0 - 46.0 % Final   MCV 09/28/2023 86.9  80.0 - 100.0 fL Final   MCH 09/28/2023 27.9  26.0 - 34.0 pg Final   MCHC 09/28/2023 32.1  30.0 - 36.0 g/dL Final   RDW 11/91/4782 13.2  11.5 - 15.5 % Final   Platelets 09/28/2023 268  150 - 400 K/uL Final   nRBC 09/28/2023 0.0  0.0 - 0.2 % Final   Neutrophils Relative % 09/28/2023 60  % Final   Neutro Abs 09/28/2023 5.2  1.7 - 7.7 K/uL Final   Lymphocytes Relative 09/28/2023 30  % Final   Lymphs Abs 09/28/2023 2.6  0.7 - 4.0 K/uL Final   Monocytes Relative 09/28/2023 8  % Final   Monocytes Absolute 09/28/2023 0.7  0.1 - 1.0 K/uL Final   Eosinophils Relative 09/28/2023 1  % Final   Eosinophils Absolute 09/28/2023 0.1  0.0 - 0.5 K/uL Final   Basophils Relative 09/28/2023 1  % Final   Basophils Absolute 09/28/2023 0.0  0.0 - 0.1 K/uL Final   Immature Granulocytes 09/28/2023 0  % Final   Abs Immature Granulocytes 09/28/2023 0.03  0.00 - 0.07 K/uL Final   Performed at [facility], [address], [place], [place] [address]   Sodium 09/28/2023 140  135 - 145 mmol/L Final   Potassium 09/28/2023 3.6  3.5 - 5.1 mmol/L Final   Chloride 09/28/2023 106  98 - 111 mmol/L Final   CO2 09/28/2023 26  22 - 32 mmol/L Final   Glucose, Bld 09/28/2023 101 (H)  70 - 99 mg/dL Final   Glucose reference range applies only to samples taken after fasting for at least 8 hours.   BUN 09/28/2023 6  6 - 20 mg/dL Final   Creatinine, Ser 09/28/2023 0.63  0.44 - 1.00 mg/dL Final   Calcium  09/28/2023 9.3  8.9 - 10.3 mg/dL Final   Total Protein 30/86/5784 6.4 (L)  6.5 - 8.1 g/dL Final   Albumin 69/62/9528 3.6  3.5 - 5.0 g/dL Final   AST 41/32/4401 21  15 - 41 U/L Final   ALT 09/28/2023 27  0 - 44 U/L Final   Alkaline Phosphatase 09/28/2023 74  38 - 126 U/L Final   Total Bilirubin 09/28/2023 0.9  0.0 - 1.2 mg/dL Final   GFR, Estimated 09/28/2023 >60  >60 mL/min Final    Comment: (NOTE) Calculated using the CKD-EPI Creatinine Equation (2021)    Anion gap 09/28/2023 8  5 - 15 Final   Performed at [facility], [address] [place], [place] [address]   Alcohol,  Ethyl (B) 09/28/2023 <15  <15 mg/dL Final   Comment: Please note change in reference range. (NOTE) For medical purposes only. Performed at [facility], [address] [place], [place] [address]    TSH 09/28/2023 2.425  0.350 - 4.500 uIU/mL Final   Comment: Performed by a 3rd Generation assay with a functional sensitivity of <=0.01 uIU/mL. Performed at [facility], [address] [place], [place] [address]    Preg Test, Ur 09/28/2023 Negative  Negative Final   POC Amphetamine UR 09/28/2023 None Detected  NONE DETECTED (Cut Off Level 1000 ng/mL) Final   POC Secobarbital (BAR) 09/28/2023 None Detected  NONE DETECTED (Cut Off Level 300 ng/mL) Final   POC Buprenorphine (BUP) 09/28/2023 None Detected  NONE DETECTED (Cut Off Level 10 ng/mL) Final   POC Oxazepam (BZO) 09/28/2023 None Detected  NONE DETECTED (Cut Off Level 300 ng/mL) Final   POC Cocaine UR 09/28/2023 Positive (A)  NONE DETECTED (Cut Off Level 300 ng/mL) Final   POC Methamphetamine UR 09/28/2023 None Detected  NONE DETECTED (Cut Off Level 1000 ng/mL) Final   POC Morphine 09/28/2023 None Detected  NONE DETECTED (Cut Off Level 300 ng/mL) Final   POC Methadone UR 09/28/2023 None Detected  NONE DETECTED (Cut Off Level 300 ng/mL) Final   POC Oxycodone  UR 09/28/2023 None Detected  NONE DETECTED (Cut Off Level 100 ng/mL) Final   POC Marijuana UR 09/28/2023 Positive (A)  NONE DETECTED (Cut Off Level 50 ng/mL) Final   Preg Test, Ur 09/28/2023 NEGATIVE  NEGATIVE Final   Comment:        THE SENSITIVITY OF THIS METHODOLOGY IS >24 mIU/mL   Admission on 09/25/2023, Discharged on 09/25/2023  Component Date Value Ref Range Status   Preg Test, Ur 09/25/2023 Negative  Negative Final   Neisseria Gonorrhea 09/25/2023 Negative    Final   Chlamydia 09/25/2023 Positive (A)   Final   Trichomonas 09/25/2023 Negative   Final   Bacterial Vaginitis (gardnerella) 09/25/2023 Positive (A)   Final   Candida Vaginitis 09/25/2023 Negative   Final   Candida Glabrata 09/25/2023 Negative   Final   Comment 09/25/2023 Normal Reference Range Bacterial Vaginosis - Negative   Final   Comment 09/25/2023 Normal Reference Range Candida Species - Negative   Final   Comment 09/25/2023 Normal Reference Range Candida Galbrata - Negative   Final   Comment 09/25/2023 Normal Reference Range Trichomonas - Negative   Final   Comment 09/25/2023 Normal Reference Ranger Chlamydia - Negative   Final   Comment 09/25/2023 Normal Reference Range Neisseria Gonorrhea - Negative   Final   HIV Screen 4th Generation wRfx 09/25/2023 Non Reactive  Non Reactive Final   Performed at [facility], [address], [place], [place] [address]   RPR Ser Ql 09/25/2023 NON REACTIVE  NON REACTIVE Final   Performed at [facility], [address], [place], [place] [address]  Admission on 07/29/2023, Discharged on 08/08/2023  Component Date Value Ref Range Status   Cholesterol 08/01/2023 184  0 - 200 mg/dL Final   Triglycerides 64/33/2951 153 (H)  <150 mg/dL Final   HDL 88/41/6606 38 (L)  >40 mg/dL Final   Total CHOL/HDL Ratio 08/01/2023 4.8  RATIO Final   VLDL 08/01/2023 31  0 - 40 mg/dL Final   LDL Cholesterol 08/01/2023 115 (H)  0 - 99 mg/dL Final   Comment:        Total Cholesterol/HDL:CHD Risk Coronary Heart Disease Risk Table  Men   Women  1/2 Average Risk   3.4   3.3  Average Risk       5.0   4.4  2 X Average Risk   9.6   7.1  3 X Average Risk  23.4   11.0        Use the calculated Patient Ratio above and the CHD Risk Table to determine the patient's CHD Risk.        ATP III CLASSIFICATION (LDL):  <100     mg/dL   Optimal  409-811  mg/dL   Near or Above                    Optimal  130-159  mg/dL   Borderline  914-782   mg/dL   High  >956     mg/dL   Very High Performed at [facility], [address] [place], [place] [address]    Lithium  Lvl 08/05/2023 0.65  0.60 - 1.20 mmol/L Final   Performed at [facility], [address] [place], [place] [address]  Admission on 07/29/2023, Discharged on 07/29/2023  Component Date Value Ref Range Status   Sodium 07/29/2023 137  135 - 145 mmol/L Final   Potassium 07/29/2023 3.5  3.5 - 5.1 mmol/L Final   Chloride 07/29/2023 103  98 - 111 mmol/L Final   CO2 07/29/2023 21 (L)  22 - 32 mmol/L Final   Glucose, Bld 07/29/2023 112 (H)  70 - 99 mg/dL Final   Glucose reference range applies only to samples taken after fasting for at least 8 hours.   BUN 07/29/2023 11  6 - 20 mg/dL Final   Creatinine, Ser 07/29/2023 0.57  0.44 - 1.00 mg/dL Final   Calcium  07/29/2023 8.7 (L)  8.9 - 10.3 mg/dL Final   Total Protein 69/62/9528 6.8  6.5 - 8.1 g/dL Final   Albumin 41/32/4401 3.8  3.5 - 5.0 g/dL Final   AST 02/72/5366 41  15 - 41 U/L Final   ALT 07/29/2023 54 (H)  0 - 44 U/L Final   Alkaline Phosphatase 07/29/2023 76  38 - 126 U/L Final   Total Bilirubin 07/29/2023 0.8  0.0 - 1.2 mg/dL Final   GFR, Estimated 07/29/2023 >60  >60 mL/min Final   Comment: (NOTE) Calculated using the CKD-EPI Creatinine Equation (2021)    Anion gap 07/29/2023 13  5 - 15 Final   Performed at [facility], [address] [place], [place] [address]   Alcohol, Ethyl (B) 07/29/2023 <10  <10 mg/dL Final   Comment: (NOTE) Lowest detectable limit for serum alcohol is 10 mg/dL.  For medical purposes only. Performed at [facility], [address] [place], [place] [address]    Salicylate Lvl 07/29/2023 <7.0 (L)  7.0 - 30.0 mg/dL Final   Performed at [facility], [address] [place], [place] [address]   Acetaminophen  (Tylenol ), Serum 07/29/2023 <10 (L)  10 - 30 ug/mL Final   Comment: (NOTE) Therapeutic concentrations vary significantly. A range  of 10-30 ug/mL  may be an effective concentration for many patients. However, some  are best treated at concentrations outside of this range. Acetaminophen  concentrations >150 ug/mL at 4 hours after ingestion  and >50 ug/mL at 12 hours after ingestion are often associated with  toxic reactions.  Performed at [facility], [address] [place], [place] [address]    WBC 07/29/2023 6.7  4.0 - 10.5 K/uL Final   RBC 07/29/2023  4.15  3.87 - 5.11 MIL/uL Final   Hemoglobin 07/29/2023 11.8 (L)  12.0 - 15.0 g/dL Final   HCT 06/08/7251 36.5  36.0 - 46.0 % Final   MCV 07/29/2023 88.0  80.0 - 100.0 fL Final   MCH 07/29/2023 28.4  26.0 - 34.0 pg Final   MCHC 07/29/2023 32.3  30.0 - 36.0 g/dL Final   RDW 66/44/0347 13.6  11.5 - 15.5 % Final   Platelets 07/29/2023 327  150 - 400 K/uL Final   nRBC 07/29/2023 0.0  0.0 - 0.2 % Final   Performed at [facility], [address] [place], [place] [address]   Tricyclic, Ur Screen 07/29/2023 POSITIVE (A)  NONE DETECTED Final   Amphetamines, Ur Screen 07/29/2023 NONE DETECTED  NONE DETECTED Final   MDMA (Ecstasy)Ur Screen 07/29/2023 NONE DETECTED  NONE DETECTED Final   Cocaine Metabolite,Ur [place] 07/29/2023 NONE DETECTED  NONE DETECTED Final   Opiate, Ur Screen 07/29/2023 NONE DETECTED  NONE DETECTED Final   Phencyclidine (PCP) Ur S 07/29/2023 NONE DETECTED  NONE DETECTED Final   Cannabinoid 50 Ng, Ur [place] 07/29/2023 POSITIVE (A)  NONE DETECTED Final   Barbiturates, Ur Screen 07/29/2023 NONE DETECTED  NONE DETECTED Final   Benzodiazepine, Ur Scrn 07/29/2023 POSITIVE (A)  NONE DETECTED Final   Methadone Scn, Ur 07/29/2023 NONE DETECTED  NONE DETECTED Final   Comment: (NOTE) Tricyclics + metabolites, urine    Cutoff 1000 ng/mL Amphetamines + metabolites, urine  Cutoff 1000 ng/mL MDMA (Ecstasy), urine              Cutoff 500 ng/mL Cocaine Metabolite, urine          Cutoff 300 ng/mL Opiate + metabolites, urine        Cutoff 300  ng/mL Phencyclidine (PCP), urine         Cutoff 25 ng/mL Cannabinoid, urine                 Cutoff 50 ng/mL Barbiturates + metabolites, urine  Cutoff 200 ng/mL Benzodiazepine, urine              Cutoff 200 ng/mL Methadone, urine                   Cutoff 300 ng/mL  The urine drug screen provides only a preliminary, unconfirmed analytical test result and should not be used for non-medical purposes. Clinical consideration and professional judgment should be applied to any positive drug screen result due to possible interfering substances. A more specific alternate chemical method must be used in order to obtain a confirmed analytical result. Gas chromatography / mass spectrometry (GC/MS) is the preferred confirm                          atory method. Performed at [facility], [address] [place], [place] [address]    Lithium  Lvl 07/29/2023 <0.06 (L)  0.60 - 1.20 mmol/L Final   Performed at [facility], [address] [place], [place] [address]  Admission on 07/19/2023, Discharged on 07/19/2023  Component Date Value Ref Range Status   WBC 07/19/2023 14.7 (H)  4.0 - 10.5 K/uL Final   RBC 07/19/2023 4.76  3.87 - 5.11 MIL/uL Final   Hemoglobin 07/19/2023 13.6  12.0 - 15.0 g/dL Final   HCT 95/18/8416 43.1  36.0 - 46.0 % Final   MCV 07/19/2023 90.5  80.0 - 100.0 fL Final   MCH 07/19/2023 28.6  26.0 -  34.0 pg Final   MCHC 07/19/2023 31.6  30.0 - 36.0 g/dL Final   RDW 95/62/1308 13.6  11.5 - 15.5 % Final   Platelets 07/19/2023 378  150 - 400 K/uL Final   nRBC 07/19/2023 0.0  0.0 - 0.2 % Final   Neutrophils Relative % 07/19/2023 80  % Final   Neutro Abs 07/19/2023 11.7 (H)  1.7 - 7.7 K/uL Final   Lymphocytes Relative 07/19/2023 13  % Final   Lymphs Abs 07/19/2023 2.0  0.7 - 4.0 K/uL Final   Monocytes Relative 07/19/2023 6  % Final   Monocytes Absolute 07/19/2023 0.8  0.1 - 1.0 K/uL Final   Eosinophils Relative 07/19/2023 0  % Final   Eosinophils Absolute  07/19/2023 0.0  0.0 - 0.5 K/uL Final   Basophils Relative 07/19/2023 0  % Final   Basophils Absolute 07/19/2023 0.0  0.0 - 0.1 K/uL Final   Immature Granulocytes 07/19/2023 1  % Final   Abs Immature Granulocytes 07/19/2023 0.07  0.00 - 0.07 K/uL Final   Performed at [facility], [address] [place], [place] [address]   Sodium 07/19/2023 142  135 - 145 mmol/L Final   Potassium 07/19/2023 4.6  3.5 - 5.1 mmol/L Final   Chloride 07/19/2023 107  98 - 111 mmol/L Final   CO2 07/19/2023 15 (L)  22 - 32 mmol/L Final   Glucose, Bld 07/19/2023 113 (H)  70 - 99 mg/dL Final   Glucose reference range applies only to samples taken after fasting for at least 8 hours.   BUN 07/19/2023 8  6 - 20 mg/dL Final   Creatinine, Ser 07/19/2023 0.71  0.44 - 1.00 mg/dL Final   Calcium  07/19/2023 9.5  8.9 - 10.3 mg/dL Final   Total Protein 69/62/9528 7.8  6.5 - 8.1 g/dL Final   Albumin 41/32/4401 4.2  3.5 - 5.0 g/dL Final   AST 02/72/5366 44 (H)  15 - 41 U/L Final   ALT 07/19/2023 49 (H)  0 - 44 U/L Final   Alkaline Phosphatase 07/19/2023 82  38 - 126 U/L Final   Total Bilirubin 07/19/2023 0.8  0.0 - 1.2 mg/dL Final   GFR, Estimated 07/19/2023 >60  >60 mL/min Final   Comment: (NOTE) Calculated using the CKD-EPI Creatinine Equation (2021)    Anion gap 07/19/2023 20 (H)  5 - 15 Final   Performed at [facility], [address] [place], [place] [address]   Troponin I (High Sensitivity) 07/19/2023 6  <18 ng/L Final   Comment: (NOTE) Elevated high sensitivity troponin I (hsTnI) values and significant  changes across serial measurements may suggest ACS but many other  chronic and acute conditions are known to elevate hsTnI results.  Refer to the "Links" section for chest pain algorithms and additional  guidance. Performed at [facility], [address] [place], [place] [address]    Color, Urine 07/19/2023 YELLOW (A)  YELLOW Final   APPearance 07/19/2023 CLEAR (A)  CLEAR Final    Specific Gravity, Urine 07/19/2023 1.008  1.005 - 1.030 Final   pH 07/19/2023 6.0  5.0 - 8.0 Final   Glucose, UA 07/19/2023 NEGATIVE  NEGATIVE mg/dL Final   Hgb urine dipstick 07/19/2023 NEGATIVE  NEGATIVE Final   Bilirubin Urine 07/19/2023 NEGATIVE  NEGATIVE Final   Ketones, ur 07/19/2023 5 (A)  NEGATIVE mg/dL Final   Protein, ur 56/38/7564 NEGATIVE  NEGATIVE mg/dL Final   Nitrite 33/29/5188 NEGATIVE  NEGATIVE Final   Leukocytes,Ua 07/19/2023 NEGATIVE  NEGATIVE  Final   Performed at [facility], [address] [place], [place] [address]   Tricyclic, Ur Screen 07/19/2023 NONE DETECTED  NONE DETECTED Final   Amphetamines, Ur Screen 07/19/2023 NONE DETECTED  NONE DETECTED Final   MDMA (Ecstasy)Ur Screen 07/19/2023 NONE DETECTED  NONE DETECTED Final   Cocaine Metabolite,Ur [place] 07/19/2023 NONE DETECTED  NONE DETECTED Final   Opiate, Ur Screen 07/19/2023 POSITIVE (A)  NONE DETECTED Final   Phencyclidine (PCP) Ur S 07/19/2023 NONE DETECTED  NONE DETECTED Final   Cannabinoid 50 Ng, Ur [place] 07/19/2023 POSITIVE (A)  NONE DETECTED Final   Barbiturates, Ur Screen 07/19/2023 NONE DETECTED  NONE DETECTED Final   Benzodiazepine, Ur Scrn 07/19/2023 NONE DETECTED  NONE DETECTED Final   Methadone Scn, Ur 07/19/2023 NONE DETECTED  NONE DETECTED Final   Comment: (NOTE) Tricyclics + metabolites, urine    Cutoff 1000 ng/mL Amphetamines + metabolites, urine  Cutoff 1000 ng/mL MDMA (Ecstasy), urine              Cutoff 500 ng/mL Cocaine Metabolite, urine          Cutoff 300 ng/mL Opiate + metabolites, urine        Cutoff 300 ng/mL Phencyclidine (PCP), urine         Cutoff 25 ng/mL Cannabinoid, urine                 Cutoff 50 ng/mL Barbiturates + metabolites, urine  Cutoff 200 ng/mL Benzodiazepine, urine              Cutoff 200 ng/mL Methadone, urine                   Cutoff 300 ng/mL  The urine drug screen provides only a preliminary, unconfirmed analytical test result and should not be used for  non-medical purposes. Clinical consideration and professional judgment should be applied to any positive drug screen result due to possible interfering substances. A more specific alternate chemical method must be used in order to obtain a confirmed analytical result. Gas chromatography / mass spectrometry (GC/MS) is the preferred confirm                          atory method. Performed at [facility], [address] [place], [place] [address]    Glucose-Capillary 07/19/2023 110 (H)  70 - 99 mg/dL Final   Glucose reference range applies only to samples taken after fasting for at least 8 hours.   Specimen Description 07/19/2023 BLOOD LEFT ASSIST CONTROL   Final   Special Requests 07/19/2023 BOTTLES DRAWN AEROBIC AND ANAEROBIC Blood Culture adequate volume   Final   Culture 07/19/2023    Final                   Value:NO GROWTH 5 DAYS Performed at [facility], [address] [place], [place] [address]    Report Status 07/19/2023 07/24/2023 FINAL   Final   Specimen Description 07/19/2023 BLOOD BLOOD RIGHT HAND   Final   Special Requests 07/19/2023 AEROBIC BOTTLE ONLY Blood Culture results may not be optimal due to an inadequate volume of blood received in culture bottles   Final   Culture 07/19/2023    Final                   Value:NO GROWTH 5 DAYS Performed at [facility], [address], [place], [place] [address]    Report Status 07/19/2023  07/24/2023 FINAL   Final   Lactic Acid, Venous 07/19/2023 6.3 (HH)  0.5 - 1.9 mmol/L Final   Comment: CRITICAL RESULT CALLED TO, READ BACK BY AND VERIFIED WITH  ALEX [facility] AT 0250 07/19/23 JG Performed at [facility], [address] [place], [place] [address]    Lactic Acid, Venous 07/19/2023 1.3  0.5 - 1.9 mmol/L Final   Performed at [facility], [address] [place], [place] [address]   Total CK 07/19/2023 822 (H)  38 - 234 U/L Final   Performed at [facility], [address] [place], [place] [address]   Sodium 07/19/2023 138  135 - 145 mmol/L Final   Potassium 07/19/2023 4.5  3.5 - 5.1 mmol/L Final   Chloride 07/19/2023 106  98 - 111 mmol/L Final   CO2 07/19/2023 23  22 - 32 mmol/L Final   Glucose, Bld 07/19/2023 93  70 - 99 mg/dL Final   Glucose reference range applies only to samples taken after fasting for at least 8 hours.   BUN 07/19/2023 9  6 - 20 mg/dL Final   Creatinine, Ser 07/19/2023 0.59  0.44 - 1.00 mg/dL Final   Calcium  07/19/2023 8.4 (L)  8.9 - 10.3 mg/dL Final   GFR, Estimated 07/19/2023 >60  >60 mL/min Final   Comment: (NOTE) Calculated using the CKD-EPI Creatinine Equation (2021)    Anion gap 07/19/2023 9  5 - 15 Final   Performed at [facility], [address] [place], [place] [address]   WBC 07/19/2023 11.3 (H)  4.0 - 10.5 K/uL Final   RBC 07/19/2023 4.04  3.87 - 5.11 MIL/uL Final   Hemoglobin 07/19/2023 11.4 (L)  12.0 - 15.0 g/dL Final   HCT 29/56/2130 35.0 (L)  36.0 - 46.0 % Final   MCV 07/19/2023 86.6  80.0 - 100.0 fL Final   MCH 07/19/2023 28.2  26.0 - 34.0 pg Final   MCHC 07/19/2023 32.6  30.0 - 36.0 g/dL Final   RDW 86/57/8469 13.5  11.5 - 15.5 % Final   Platelets 07/19/2023 279  150 - 400 K/uL Final   nRBC 07/19/2023 0.0  0.0 - 0.2 % Final   Performed at [facility], [address] [place], [place] [address]   Troponin I (High Sensitivity) 07/19/2023 15  <18 ng/L Final   Comment: (NOTE) Elevated high sensitivity troponin I (hsTnI) values and significant  changes across serial measurements may suggest ACS but many other  chronic and acute conditions are known to elevate hsTnI results.  Refer to the "Links" section for chest pain algorithms and additional  guidance. Performed at [facility], [address] [place], [place] [address]    Lactic Acid, Venous 07/19/2023 0.9  0.5 - 1.9 mmol/L Final   Performed at [facility], [address] [place], [place] [address]    Blood  Alcohol level:  Lab Results  Component Value Date   [facility] <15 09/28/2023   ETH <10 07/29/2023    Metabolic Disorder Labs: Lab Results  Component Value Date   HGBA1C 5.5 07/11/2023   MPG 111.15 07/11/2023   MPG 111.15 02/04/2021   Lab Results  Component Value Date   PROLACTIN 39.5 (H) 04/12/2018   PROLACTIN 2.0 12/05/2011   Lab Results  Component Value Date   CHOL 184 08/01/2023   TRIG 153 (H) 08/01/2023   HDL 38 (L) 08/01/2023   CHOLHDL 4.8 08/01/2023   VLDL 31 08/01/2023   LDLCALC 115 (H)  08/01/2023   LDLCALC 109 (H) 09/05/2022    Therapeutic Lab Levels: Lab Results  Component Value Date   LITHIUM  0.65 08/05/2023   LITHIUM  <0.06 (L) 07/29/2023   No results found for: "VALPROATE" No results found for: "CBMZ"  Physical Findings   AIMS    Flowsheet Row [facility]) [facility] Ad[facility] (Discharged) from 05/14/2020 in [facility]PATIENT [facility]AV[facility]AL MEDICINE Admission (Discharged) from OP Visit from 08/08/[facility] Admission (Discharged) from 10/16/2018 in [facility]PATIENT BE[facility]AL MEDICINE  AIMS Total Score 0 0 0 0 0      AUDIT    Flowsheet Row Admission (Discharged) from 07/29/2023 in [facility] INPATIENT BE[facility]AVIORAL MEDICINE Admission (Discharged) [facility] (Discharged) [facility] Admission (Discharged) from 05/14/2020 in [facility] INPATIENT BEHAVIORAL MEDICINE Admission (Discharged) from 09/25/2019 in [facility] INPATIENT BEHAVIORAL MEDICINE  Alcohol Use Disorder Identification Test Final Score (AUDIT) 0 4 1 3 4       PHQ2-9    Flowsheet Row ED from 09/28/2023 in [facility] Office Visit from 08/21/2023 in [facility] Of [place]. [facility] Office Visit from 07/07/2023 in [facility] - [facility] Dept Of [place]. [facility] Office Visit from 06/26/2023 in [facility] - [facility] Dept Of [place]. [facility] Office Visit from 06/22/2023 in [facility] - A Dept Of [place]  PHQ-2 Total Score 2 0 0 0 0  PHQ-9 Total Score 9 0 0 0 0      Flowsheet Row ED from 09/28/2023 in [facility] Most recent reading at 09/28/2023  6:47 AM ED from 09/28/2023 in [facility] Most recent reading at 09/28/2023  6:32 AM ED from 09/25/2023 in [facility] [place] [facility] Most recent reading at 09/25/2023  3:12 PM  C-SSRS RISK CATEGORY Moderate Risk Moderate Risk No Risk        Psychiatric Specialty Exam: Physical Exam Constitutional:      Appearance: the patient is not toxic-appearing.  Pulmonary:     Effort: Pulmonary effort is normal.  Neurological:     General: No focal deficit present.     Mental Status: the patient is alert and oriented to person, place, and time.   Review of Systems  Respiratory:  Negative for shortness of breath.   Cardiovascular:  Negative for chest pain.  Gastrointestinal:  Negative for abdominal pain, constipation, diarrhea, nausea and vomiting.  Neurological:  Negative for headaches.      BP 115/80 (BP Location: Right Arm)   Pulse 65   Temp 98.1 F (36.7 C) (Oral)   Resp 16   SpO2 99%   General Appearance: Fairly Groomed  Eye Contact:  Good  Speech:  Clear and Coherent  Volume:  Normal  Mood:  Euthymic  Affect:  Congruent  Thought Process:  Coherent  Orientation:  Full (Time, Place, and Person)  Thought Content: Logical   Suicidal Thoughts:  No  Homicidal Thoughts:  No  Memory:  Immediate;   Good  Judgement:  fair  Insight:  fair  Psychomotor Activity:  Normal  Concentration:  Concentration: Good  Recall:  Good  Fund of Knowledge: Good  Language: Good  Akathisia:  No  Handed:  not assessed  AIMS (if indicated): not done  Assets:  Communication Skills Desire for Improvement Leisure Time Physical Health  ADL's:  Intact  Cognition: WNL  Sleep:  Fair    Treatment Plan Summary: Daily contact with patient to assess and evaluate symptoms and progress in treatment and Medication management.  Cocaine abuse - Residential rehab placement  Bipolar disorder, possible cluster B personality traits - Continue lithium  300 mg twice daily - Lithium  level ordered - Continue prazosin  1 mg nightly - Prolixin  from last hospitalization not continued, does not seem necessary currently  EKG and lab work unremarkable.   Marilou Showman, MD 10/01/2023 10:34 AM
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
## 2023-10-01 NOTE — ED Notes (Addendum)
 Pt want to speak to someone about a program in [place] instead of [place] so she can get back home and start over new with support.

## 2023-10-01 NOTE — ED Notes (Signed)
 Patient is cooperative and compliant with staff directions. She is social on approach and initiates interactions with staff and peers. Patient denies any homicidal or suicidal ideations as well any hallucinations and she is not in pain. She describes her mood as "hopeful" but she does endorse to being somewhat worried about the future. Patient says her appetite has been good and that her last bowel movement was 4/26. Will continue to provide support, redirection, and further interventions as needed.

## 2023-10-02 DIAGNOSIS — F4310 Post-traumatic stress disorder, unspecified: Secondary | ICD-10-CM | POA: Diagnosis not present

## 2023-10-02 DIAGNOSIS — R45851 Suicidal ideations: Secondary | ICD-10-CM | POA: Diagnosis not present

## 2023-10-02 DIAGNOSIS — F319 Bipolar disorder, unspecified: Secondary | ICD-10-CM | POA: Diagnosis not present

## 2023-10-02 DIAGNOSIS — Z5900 Homelessness unspecified: Secondary | ICD-10-CM | POA: Diagnosis not present

## 2023-10-02 LAB — LITHIUM LEVEL: Lithium Lvl: 0.33 mmol/L — ABNORMAL LOW (ref 0.60–1.20)

## 2023-10-02 MED ORDER — LITHIUM CARBONATE 300 MG PO CAPS
450.0000 mg | ORAL_CAPSULE | Freq: Two times a day (BID) | ORAL | Status: DC
  Administered 2023-10-02 – 2023-10-04 (×4): 450 mg via ORAL
  Filled 2023-10-02 (×4): qty 1

## 2023-10-02 MED ORDER — FLUTICASONE PROPIONATE 50 MCG/ACT NA SUSP
1.0000 | Freq: Every day | NASAL | Status: DC
Start: 2023-10-02 — End: 2023-10-04
  Administered 2023-10-02 – 2023-10-04 (×3): 1 via NASAL
  Filled 2023-10-02: qty 16

## 2023-10-02 NOTE — ED Notes (Signed)
 Lithium  medication discussed with pt.including indication, lab monitoring, dosage change, etc.- provided via verbal discussion with RN and print-out given to pt. Pt denied further questions at this time.
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
## 2023-10-02 NOTE — ED Notes (Signed)
 Patient is in the dayroom calm and composed watching TV with other patients.NAD. Denies needing anything. Environment secured and safe. Will keep monitoring for safety.

## 2023-10-02 NOTE — ED Notes (Signed)
 Patient alert & oriented x4. Denies intent to harm self or others when asked. Denies A/VH. Patient denies any physical complaints when asked. No acute distress noted. Scheduled medications administered with no complications. Support and encouragement provided. Routine safety checks conducted per facility protocol. Encouraged patient to notify staff if any thoughts of harm towards self or others arise. Patient verbalizes understanding and agreement.
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
## 2023-10-02 NOTE — Discharge Planning (Signed)
 SW met with patient to discuss current status and disposition plans. Patient stated that she did not feel she needed a residential treatment center and discussed options for [facility]. SW provided her with a list of houses with female availability in the [place] area and encouraged her to make calls for phone assessment. SW will also provide her with a list of AA/NA meetings and local resources for shelters and out patient services for medical management.

## 2023-10-02 NOTE — ED Notes (Signed)
 Patient sitting in room, calm and composed. Environment secured, safety checks in place per facility policy.

## 2023-10-02 NOTE — ED Notes (Signed)
 Patient is sleeping. Respirations equal and unlabored, skin warm and dry. No change in assessment or acuity. Routine safety checks conducted according to facility protocol. Will continue to monitor for safety.

## 2023-10-02 NOTE — ED Provider Notes (Signed)
 Behavioral Health Progress Note  Date and Time: 10/02/2023 10:24 AM Name: Kelly Carr MRN:  [id number]  Subjective:  Kelly Carr, is 53 y.o. female with a history of bipolar I disorder, PTSD, suicidal ideation, previous suicide attempts, and past psychiatric hospitalizations who presented voluntarily to [facility] and admitted to [facility] for suicidal ideation.   Patient was assessed in her room in no acute distress. On interview, she presented as alert and oriented to person, place, time, and situation. She reported her mood as "good and hopeful" and stated that she has been doing much better over the last 24 hours. She reported spending her time attending groups and enjoying the outdoors. She reported sleeping well since admission. She endorsed a good appetite and hydration with her most recent bowel movement this morning. She denied any symptoms of anxiety or depression and stated that her energy levels are returning to normal. She denied any new pains. She denied any SI, HI, and AVH, and was not responding to internal stimuli during our conversation. She reported significant beneficial effects from her current medication regimen, stating she experiences much more clarity and focus. She denied any medication side effects. She also denied any substance cravings or symptoms of withdrawal. I discussed her lithium  level came back subtherapeudic so we will increase the dose to 450 mg BID. She endorsed some worry about her disposition. She reported that she wanted to find a way to get away from [place]. She reported wanting to end up back in [place] , but wondered if she could get any residential resources for any where else outside of [place]. She reported no other concerns during our conversation.  Diagnosis:  Final diagnoses:  Homelessness unspecified  Passive suicidal ideations  Ineffective coping  Recurrent major depressive disorder, remission status unspecified (HCC)    Total Time spent with  patient: 20 minutes  Past Psychiatric History:  - Previous Dx: Bipolar I, PTSD, GAD, MDD, ADD, and OCD - Current Psychiatric Medications: Abilify  IM 400 mg q 28 days, Hydroxyzine , Minipress , Lithium , Trazodone , and Fluphenazine  - patient reported not taking any aside from Abilify  IM; patient reported being prescribed Abilify  pill recently but stated she had not picked it up (chart review shows that patient has taken oral Abilify  in Feb/March 2025.) - Past Psychiatric Medications: and Depakote - Previous Psychiatrist/Therapist: Followed for many years by Dr. Dianne Fortune at [facility]; currently looking for new outpatient psychiatrist because of insurance. - Previous Hospitalizations: Extensive history of previous psychiatric hospitalizations - Previous Self-Harm/SI/Suicide Attempts: Endorses multiple instances of self harm and suicide attempts including multiple overdose attempts as a teenager and in 2014 and attempts to cut her wrists  Past Medical History:  - Previous Dx: Denied - Current Medications: Denied - Allergies: Denied any food allergies. Medication allergies include: Doxycycline , celecoxib , valproic acid, haloperidol , methylprednisolone , gabapentin , prednisone , risperidone and related, tramadol  - Hospitalizations: Denied - Surgeries: Denied  Family History:  - None reported.   Family Psychiatric  History:  - Dx: Mom- Hx of bipolar, schizophrenia, depression, and anxiety; Daughter- Hx of anxiety - Rx: Patient reported being unsure of their medications - Suicide attempts: History of attempts in mom - Homicide: Denied - Substance Use: Father - Alcohol use disorder  Social History:  - Residence: Current lives in [place], [place] but is unhoused; grew up in [place] - Job: Current unemployed, but worked at [employer] most recently - Education: Current enrolled to Public house manager Degree - Relationships: Never been married; no current partner - Children: 2 children - 28  yo son and 19 yo daughter - Firearm access: denied - Stockpile of pills: denied  Additional Social History:  - Tobacco: Endorses intermitted use since the age of 5. Current smokes 1 pack per day with last use 2 days ago. - Alcohol: Endorses intermittent use since the age of 27. Period of sobriety during pregnancy. Current restarted occasional drinking in 2019, reported having one normal sized beer socially. Reported last drink of one beer on Sunday.  - Marijuana: Endorses intermitted use since 2019. Reported trying a vape pen "a few days ago." - Stimulant: Endorses recent methamphetamine and cocaine/crack use. Meth: Reports only every trying twice while part of recent trap house experience. Unsure of how much was used, but was smoked. Patient also reported being injected with an unknown drug by other residents in the house, assumed to be methamphetamines. Cocaine/Crack: Reports significant use starting 1 year ago. Most recently has been smoking an unknown quantity daily for the past two months. - Opioid: Denies any current or past history of opioid use, but does endorse past hospitalization where Narcan  was used on her. Patient reported that her other drugs may have been laced with fentanyl . - Benzo: Denied any current benzo use but endorsed past history of benzo use. Patient stated that last use was months to years ago.  Sleep: Good  Appetite:  Good  Current Medications:  Current Facility-Administered Medications  Medication Dose Route Frequency Provider Last Rate Last Admin   acetaminophen  (TYLENOL ) tablet 650 mg  650 mg Oral Q6H PRN Dorthea Gauze, NP   650 mg at 09/29/23 2125   alum & mag hydroxide-simeth (MAALOX/MYLANTA) 200-200-20 MG/5ML suspension 30 mL  30 mL Oral Q4H PRN Dorthea Gauze, NP   30 mL at 09/30/23 1606   hydrocortisone  cream 1 %   Topical BID PRN Marilou Showman, MD   Given at 10/01/23 1507   hydrOXYzine  (ATARAX ) tablet 25 mg  25 mg Oral TID PRN Parmar, Meenakshi, MD   25 mg  at 09/30/23 2122   lithium  carbonate capsule 300 mg  300 mg Oral BID WC Parmar, Meenakshi, MD   300 mg at 10/02/23 0981   loratadine  (CLARITIN ) tablet 10 mg  10 mg Oral Daily Ajibola, Ene A, NP       magnesium  hydroxide (MILK OF MAGNESIA) suspension 30 mL  30 mL Oral Daily PRN Dorthea Gauze, NP       nicotine  (NICODERM CQ  - dosed in mg/24 hours) patch 21 mg  21 mg Transdermal Daily Silas Drivers, MD   21 mg at 10/01/23 1914   OLANZapine  (ZYPREXA ) injection 10 mg  10 mg Intramuscular TID PRN Dorthea Gauze, NP       OLANZapine  (ZYPREXA ) injection 5 mg  5 mg Intramuscular TID PRN Dorthea Gauze, NP       OLANZapine  zydis (ZYPREXA ) disintegrating tablet 5 mg  5 mg Oral TID PRN Dorthea Gauze, NP       prazosin  (MINIPRESS ) capsule 1 mg  1 mg Oral QHS Parmar, Meenakshi, MD   1 mg at 10/01/23 2149   traZODone  (DESYREL ) tablet 50 mg  50 mg Oral QHS PRN Parmar, Meenakshi, MD   50 mg at 10/01/23 2149   Current Outpatient Medications  Medication Sig Dispense Refill   albuterol  (VENTOLIN  HFA) 108 (90 Base) MCG/ACT inhaler Inhale 2 puffs into the lungs every 6 (six) hours as needed for wheezing or shortness of breath. 18 g 2   ARIPiprazole  (ABILIFY ) 20 MG tablet Take 20 mg by mouth daily.  cetirizine  (ZYRTEC  ALLERGY) 10 MG tablet Take 1 tablet (10 mg total) by mouth daily. 30 tablet 2   fluticasone  (FLONASE ) 50 MCG/ACT nasal spray Place 2 sprays into both nostrils daily for 15 days. 16 g 0   hydrOXYzine  (ATARAX ) 25 MG tablet Take 25 mg by mouth 3 (three) times daily as needed for anxiety.     prazosin  (MINIPRESS ) 1 MG capsule Take 1 capsule (1 mg total) by mouth at bedtime. 30 capsule 0   fluPHENAZine  (PROLIXIN ) 10 MG tablet Take 1 tablet (10 mg total) by mouth at bedtime. (Patient not taking: Reported on 09/28/2023) 30 tablet 0   traZODone  (DESYREL ) 50 MG tablet Take 1 tablet (50 mg total) by mouth at bedtime. (Patient not taking: Reported on 09/28/2023) 30 tablet 0    Labs  Lab Results:  Admission  on 09/28/2023  Component Date Value Ref Range Status   SARS Coronavirus 2 by RT PCR 09/28/2023 NEGATIVE  NEGATIVE Final   Performed at [facility], [address], [place], [place] [address]   WBC 09/28/2023 8.6  4.0 - 10.5 K/uL Final   RBC 09/28/2023 4.52  3.87 - 5.11 MIL/uL Final   Hemoglobin 09/28/2023 12.6  12.0 - 15.0 g/dL Final   HCT 56/21/3086 39.3  36.0 - 46.0 % Final   MCV 09/28/2023 86.9  80.0 - 100.0 fL Final   MCH 09/28/2023 27.9  26.0 - 34.0 pg Final   MCHC 09/28/2023 32.1  30.0 - 36.0 g/dL Final   RDW 57/84/6962 13.2  11.5 - 15.5 % Final   Platelets 09/28/2023 268  150 - 400 K/uL Final   nRBC 09/28/2023 0.0  0.0 - 0.2 % Final   Neutrophils Relative % 09/28/2023 60  % Final   Neutro Abs 09/28/2023 5.2  1.7 - 7.7 K/uL Final   Lymphocytes Relative 09/28/2023 30  % Final   Lymphs Abs 09/28/2023 2.6  0.7 - 4.0 K/uL Final   Monocytes Relative 09/28/2023 8  % Final   Monocytes Absolute 09/28/2023 0.7  0.1 - 1.0 K/uL Final   Eosinophils Relative 09/28/2023 1  % Final   Eosinophils Absolute 09/28/2023 0.1  0.0 - 0.5 K/uL Final   Basophils Relative 09/28/2023 1  % Final   Basophils Absolute 09/28/2023 0.0  0.0 - 0.1 K/uL Final   Immature Granulocytes 09/28/2023 0  % Final   Abs Immature Granulocytes 09/28/2023 0.03  0.00 - 0.07 K/uL Final   Performed at [facility], [address] [place], [place] [address]   Sodium 09/28/2023 140  135 - 145 mmol/L Final   Potassium 09/28/2023 3.6  3.5 - 5.1 mmol/L Final   Chloride 09/28/2023 106  98 - 111 mmol/L Final   CO2 09/28/2023 26  22 - 32 mmol/L Final   Glucose, Bld 09/28/2023 101 (H)  70 - 99 mg/dL Final   Glucose reference range applies only to samples taken after fasting for at least 8 hours.   BUN 09/28/2023 6  6 - 20 mg/dL Final   Creatinine, Ser 09/28/2023 0.63  0.44 - 1.00 mg/dL Final   Calcium  09/28/2023 9.3  8.9 - 10.3 mg/dL Final   Total Protein 13/24/4010 6.4 (L)  6.5 - 8.1 g/dL Final   Albumin 27/25/3664  3.6  3.5 - 5.0 g/dL Final   AST 40/34/7425 21  15 - 41 U/L Final   ALT 09/28/2023 27  0 - 44 U/L Final   Alkaline Phosphatase 09/28/2023 74  38 - 126 U/L Final  Total Bilirubin 09/28/2023 0.9  0.0 - 1.2 mg/dL Final   GFR, Estimated 09/28/2023 >60  >60 mL/min Final   Comment: (NOTE) Calculated using the CKD-EPI Creatinine Equation (2021)    Anion gap 09/28/2023 8  5 - 15 Final   Performed at [facility], [address], [place], [place] [address]   Alcohol, Ethyl (B) 09/28/2023 <15  <15 mg/dL Final   Comment: Please note change in reference range. (NOTE) For medical purposes only. Performed at [facility], [address] [place], [place] [address]    TSH 09/28/2023 2.425  0.350 - 4.500 uIU/mL Final   Comment: Performed by a 3rd Generation assay with a functional sensitivity of <=0.01 uIU/mL. Performed at [facility], [address], [place], [place] [address]    Preg Test, Ur 09/28/2023 Negative  Negative Final   POC Amphetamine UR 09/28/2023 None Detected  NONE DETECTED (Cut Off Level 1000 ng/mL) Final   POC Secobarbital (BAR) 09/28/2023 None Detected  NONE DETECTED (Cut Off Level 300 ng/mL) Final   POC Buprenorphine (BUP) 09/28/2023 None Detected  NONE DETECTED (Cut Off Level 10 ng/mL) Final   POC Oxazepam (BZO) 09/28/2023 None Detected  NONE DETECTED (Cut Off Level 300 ng/mL) Final   POC Cocaine UR 09/28/2023 Positive (A)  NONE DETECTED (Cut Off Level 300 ng/mL) Final   POC Methamphetamine UR 09/28/2023 None Detected  NONE DETECTED (Cut Off Level 1000 ng/mL) Final   POC Morphine 09/28/2023 None Detected  NONE DETECTED (Cut Off Level 300 ng/mL) Final   POC Methadone UR 09/28/2023 None Detected  NONE DETECTED (Cut Off Level 300 ng/mL) Final   POC Oxycodone  UR 09/28/2023 None Detected  NONE DETECTED (Cut Off Level 100 ng/mL) Final   POC Marijuana UR 09/28/2023 Positive (A)  NONE DETECTED (Cut Off Level 50 ng/mL) Final   Preg Test, Ur 09/28/2023 NEGATIVE   NEGATIVE Final   Comment:        THE SENSITIVITY OF THIS METHODOLOGY IS >24 mIU/mL    Lithium  Lvl 10/02/2023 0.33 (L)  0.60 - 1.20 mmol/L Final   Performed at [facility], [address], [place], [place] [address]  Admission on 09/25/2023, Discharged on 09/25/2023  Component Date Value Ref Range Status   Preg Test, Ur 09/25/2023 Negative  Negative Final   Neisseria Gonorrhea 09/25/2023 Negative   Final   Chlamydia 09/25/2023 Positive (A)   Final   Trichomonas 09/25/2023 Negative   Final   Bacterial Vaginitis (gardnerella) 09/25/2023 Positive (A)   Final   Candida Vaginitis 09/25/2023 Negative   Final   Candida Glabrata 09/25/2023 Negative   Final   Comment 09/25/2023 Normal Reference Range Bacterial Vaginosis - Negative   Final   Comment 09/25/2023 Normal Reference Range Candida Species - Negative   Final   Comment 09/25/2023 Normal Reference Range Candida Galbrata - Negative   Final   Comment 09/25/2023 Normal Reference Range Trichomonas - Negative   Final   Comment 09/25/2023 Normal Reference Ranger Chlamydia - Negative   Final   Comment 09/25/2023 Normal Reference Range Neisseria Gonorrhea - Negative   Final   HIV Screen 4th Generation wRfx 09/25/2023 Non Reactive  Non Reactive Final   Performed at [facility], [address], [place], [place] [address]   RPR Ser Ql 09/25/2023 NON REACTIVE  NON REACTIVE Final   Performed at [facility], [address], [place], [place] [address]  Admission on 07/29/2023, Discharged on 08/08/2023  Component Date Value Ref Range Status   Cholesterol 08/01/2023 184  0 - 200 mg/dL Final   Triglycerides 16/03/9603 153 (H)  <150 mg/dL Final   HDL 54/02/8118 38 (L)  >40 mg/dL Final   Total CHOL/HDL Ratio 08/01/2023 4.8  RATIO Final   VLDL 08/01/2023 31  0 - 40 mg/dL Final   LDL Cholesterol 08/01/2023 115 (H)  0 - 99 mg/dL Final   Comment:        Total Cholesterol/HDL:CHD Risk Coronary Heart Disease Risk Table                      Men   Women  1/2 Average Risk   3.4   3.3  Average Risk       5.0   4.4  2 X Average Risk   9.6   7.1  3 X Average Risk  23.4   11.0        Use the calculated Patient Ratio above and the CHD Risk Table to determine the patient's CHD Risk.        ATP III CLASSIFICATION (LDL):  <100     mg/dL   Optimal  147-829  mg/dL   Near or Above                    Optimal  130-159  mg/dL   Borderline  562-130  mg/dL   High  >865     mg/dL   Very High Performed at [facility], [address] [place], [place] [address]    Lithium  Lvl 08/05/2023 0.65  0.60 - 1.20 mmol/L Final   Performed at [facility], [address] [place], [place] [address]  Admission on 07/29/2023, Discharged on 07/29/2023  Component Date Value Ref Range Status   Sodium 07/29/2023 137  135 - 145 mmol/L Final   Potassium 07/29/2023 3.5  3.5 - 5.1 mmol/L Final   Chloride 07/29/2023 103  98 - 111 mmol/L Final   CO2 07/29/2023 21 (L)  22 - 32 mmol/L Final   Glucose, Bld 07/29/2023 112 (H)  70 - 99 mg/dL Final   Glucose reference range applies only to samples taken after fasting for at least 8 hours.   BUN 07/29/2023 11  6 - 20 mg/dL Final   Creatinine, Ser 07/29/2023 0.57  0.44 - 1.00 mg/dL Final   Calcium  07/29/2023 8.7 (L)  8.9 - 10.3 mg/dL Final   Total Protein 84/13/2440 6.8  6.5 - 8.1 g/dL Final   Albumin 04/02/2535 3.8  3.5 - 5.0 g/dL Final   AST 64/40/3474 41  15 - 41 U/L Final   ALT 07/29/2023 54 (H)  0 - 44 U/L Final   Alkaline Phosphatase 07/29/2023 76  38 - 126 U/L Final   Total Bilirubin 07/29/2023 0.8  0.0 - 1.2 mg/dL Final   GFR, Estimated 07/29/2023 >60  >60 mL/min Final   Comment: (NOTE) Calculated using the CKD-EPI Creatinine Equation (2021)    Anion gap 07/29/2023 13  5 - 15 Final   Performed at [facility], [address] [place], [place] [address]   Alcohol, Ethyl (B) 07/29/2023 <10  <10 mg/dL Final   Comment: (NOTE) Lowest detectable limit for serum alcohol is 10  mg/dL.  For medical purposes only. Performed at [facility], [address] [place], [place] [address]    Salicylate Lvl 07/29/2023 <7.0 (L)  7.0 - 30.0 mg/dL Final   Performed at [facility],  [address], [place], [place] [address]   Acetaminophen  (Tylenol ), Serum 07/29/2023 <10 (L)  10 - 30 ug/mL Final   Comment: (NOTE) Therapeutic concentrations vary significantly. A range of 10-30 ug/mL  may be an effective concentration for many patients. However, some  are best treated at concentrations outside of this range. Acetaminophen  concentrations >150 ug/mL at 4 hours after ingestion  and >50 ug/mL at 12 hours after ingestion are often associated with  toxic reactions.  Performed at [facility], [address] [place], [place] [address]    WBC 07/29/2023 6.7  4.0 - 10.5 K/uL Final   RBC 07/29/2023 4.15  3.87 - 5.11 MIL/uL Final   Hemoglobin 07/29/2023 11.8 (L)  12.0 - 15.0 g/dL Final   HCT 09/81/1914 36.5  36.0 - 46.0 % Final   MCV 07/29/2023 88.0  80.0 - 100.0 fL Final   MCH 07/29/2023 28.4  26.0 - 34.0 pg Final   MCHC 07/29/2023 32.3  30.0 - 36.0 g/dL Final   RDW 78/29/5621 13.6  11.5 - 15.5 % Final   Platelets 07/29/2023 327  150 - 400 K/uL Final   nRBC 07/29/2023 0.0  0.0 - 0.2 % Final   Performed at [facility], [address] [place], [place] [address]   Tricyclic, Ur Screen 07/29/2023 POSITIVE (A)  NONE DETECTED Final   Amphetamines, Ur Screen 07/29/2023 NONE DETECTED  NONE DETECTED Final   MDMA (Ecstasy)Ur Screen 07/29/2023 NONE DETECTED  NONE DETECTED Final   Cocaine Metabolite,Ur [place] 07/29/2023 NONE DETECTED  NONE DETECTED Final   Opiate, Ur Screen 07/29/2023 NONE DETECTED  NONE DETECTED Final   Phencyclidine (PCP) Ur S 07/29/2023 NONE DETECTED  NONE DETECTED Final   Cannabinoid 50 Ng, Ur [place] 07/29/2023 POSITIVE (A)  NONE DETECTED Final   Barbiturates, Ur Screen 07/29/2023 NONE DETECTED  NONE DETECTED Final    Benzodiazepine, Ur Scrn 07/29/2023 POSITIVE (A)  NONE DETECTED Final   Methadone Scn, Ur 07/29/2023 NONE DETECTED  NONE DETECTED Final   Comment: (NOTE) Tricyclics + metabolites, urine    Cutoff 1000 ng/mL Amphetamines + metabolites, urine  Cutoff 1000 ng/mL MDMA (Ecstasy), urine              Cutoff 500 ng/mL Cocaine Metabolite, urine          Cutoff 300 ng/mL Opiate + metabolites, urine        Cutoff 300 ng/mL Phencyclidine (PCP), urine         Cutoff 25 ng/mL Cannabinoid, urine                 Cutoff 50 ng/mL Barbiturates + metabolites, urine  Cutoff 200 ng/mL Benzodiazepine, urine              Cutoff 200 ng/mL Methadone, urine                   Cutoff 300 ng/mL  The urine drug screen provides only a preliminary, unconfirmed analytical test result and should not be used for non-medical purposes. Clinical consideration and professional judgment should be applied to any positive drug screen result due to possible interfering substances. A more specific alternate chemical method must be used in order to obtain a confirmed analytical result. Gas chromatography / mass spectrometry (GC/MS) is the preferred confirm                          atory method. Performed at [facility], [address]  [place], [place] [address]    Lithium  Lvl 07/29/2023 <0.06 (L)  0.60 - 1.20 mmol/L Final   Performed at [facility], [address] [place], [place] [address]  Admission on 07/19/2023, Discharged on 07/19/2023  Component Date Value Ref Range Status   WBC 07/19/2023 14.7 (H)  4.0 - 10.5 K/uL Final   RBC 07/19/2023 4.76  3.87 - 5.11 MIL/uL Final   Hemoglobin 07/19/2023 13.6  12.0 - 15.0 g/dL Final   HCT 95/28/4132 43.1  36.0 - 46.0 % Final   MCV 07/19/2023 90.5  80.0 - 100.0 fL Final   MCH 07/19/2023 28.6  26.0 - 34.0 pg Final   MCHC 07/19/2023 31.6  30.0 - 36.0 g/dL Final   RDW 44/06/270 13.6  11.5 - 15.5 % Final   Platelets 07/19/2023 378  150 - 400 K/uL Final   nRBC  07/19/2023 0.0  0.0 - 0.2 % Final   Neutrophils Relative % 07/19/2023 80  % Final   Neutro Abs 07/19/2023 11.7 (H)  1.7 - 7.7 K/uL Final   Lymphocytes Relative 07/19/2023 13  % Final   Lymphs Abs 07/19/2023 2.0  0.7 - 4.0 K/uL Final   Monocytes Relative 07/19/2023 6  % Final   Monocytes Absolute 07/19/2023 0.8  0.1 - 1.0 K/uL Final   Eosinophils Relative 07/19/2023 0  % Final   Eosinophils Absolute 07/19/2023 0.0  0.0 - 0.5 K/uL Final   Basophils Relative 07/19/2023 0  % Final   Basophils Absolute 07/19/2023 0.0  0.0 - 0.1 K/uL Final   Immature Granulocytes 07/19/2023 1  % Final   Abs Immature Granulocytes 07/19/2023 0.07  0.00 - 0.07 K/uL Final   Performed at [facility], [address] [place], [place] [address]   Sodium 07/19/2023 142  135 - 145 mmol/L Final   Potassium 07/19/2023 4.6  3.5 - 5.1 mmol/L Final   Chloride 07/19/2023 107  98 - 111 mmol/L Final   CO2 07/19/2023 15 (L)  22 - 32 mmol/L Final   Glucose, Bld 07/19/2023 113 (H)  70 - 99 mg/dL Final   Glucose reference range applies only to samples taken after fasting for at least 8 hours.   BUN 07/19/2023 8  6 - 20 mg/dL Final   Creatinine, Ser 07/19/2023 0.71  0.44 - 1.00 mg/dL Final   Calcium  07/19/2023 9.5  8.9 - 10.3 mg/dL Final   Total Protein 40/34/7425 7.8  6.5 - 8.1 g/dL Final   Albumin 95/63/8756 4.2  3.5 - 5.0 g/dL Final   AST 43/32/9518 44 (H)  15 - 41 U/L Final   ALT 07/19/2023 49 (H)  0 - 44 U/L Final   Alkaline Phosphatase 07/19/2023 82  38 - 126 U/L Final   Total Bilirubin 07/19/2023 0.8  0.0 - 1.2 mg/dL Final   GFR, Estimated 07/19/2023 >60  >60 mL/min Final   Comment: (NOTE) Calculated using the CKD-EPI Creatinine Equation (2021)    Anion gap 07/19/2023 20 (H)  5 - 15 Final   Performed at [facility], [address] [place], [place] [address]   Troponin I (High Sensitivity) 07/19/2023 6  <18 ng/L Final   Comment: (NOTE) Elevated high sensitivity troponin I (hsTnI) values and  significant  changes across serial measurements may suggest ACS but many other  chronic and acute conditions are known to elevate hsTnI results.  Refer to the "Links" section for chest pain algorithms and additional  guidance. Performed at [facility], [address] [place],  [place] [address]    Color, Urine 07/19/2023 YELLOW (A)  YELLOW Final   APPearance 07/19/2023 CLEAR (A)  CLEAR Final   Specific Gravity, Urine 07/19/2023 1.008  1.005 - 1.030 Final   pH 07/19/2023 6.0  5.0 - 8.0 Final   Glucose, UA 07/19/2023 NEGATIVE  NEGATIVE mg/dL Final   Hgb urine dipstick 07/19/2023 NEGATIVE  NEGATIVE Final   Bilirubin Urine 07/19/2023 NEGATIVE  NEGATIVE Final   Ketones, ur 07/19/2023 5 (A)  NEGATIVE mg/dL Final   Protein, ur 91/47/8295 NEGATIVE  NEGATIVE mg/dL Final   Nitrite 62/13/0865 NEGATIVE  NEGATIVE Final   Leukocytes,Ua 07/19/2023 NEGATIVE  NEGATIVE Final   Performed at [facility], [address] [place], [place] [address]   Tricyclic, Ur Screen 07/19/2023 NONE DETECTED  NONE DETECTED Final   Amphetamines, Ur Screen 07/19/2023 NONE DETECTED  NONE DETECTED Final   MDMA (Ecstasy)Ur Screen 07/19/2023 NONE DETECTED  NONE DETECTED Final   Cocaine Metabolite,Ur [place] 07/19/2023 NONE DETECTED  NONE DETECTED Final   Opiate, Ur Screen 07/19/2023 POSITIVE (A)  NONE DETECTED Final   Phencyclidine (PCP) Ur S 07/19/2023 NONE DETECTED  NONE DETECTED Final   Cannabinoid 50 Ng, Ur [place] 07/19/2023 POSITIVE (A)  NONE DETECTED Final   Barbiturates, Ur Screen 07/19/2023 NONE DETECTED  NONE DETECTED Final   Benzodiazepine, Ur Scrn 07/19/2023 NONE DETECTED  NONE DETECTED Final   Methadone Scn, Ur 07/19/2023 NONE DETECTED  NONE DETECTED Final   Comment: (NOTE) Tricyclics + metabolites, urine    Cutoff 1000 ng/mL Amphetamines + metabolites, urine  Cutoff 1000 ng/mL MDMA (Ecstasy), urine              Cutoff 500 ng/mL Cocaine Metabolite, urine          Cutoff 300 ng/mL Opiate +  metabolites, urine        Cutoff 300 ng/mL Phencyclidine (PCP), urine         Cutoff 25 ng/mL Cannabinoid, urine                 Cutoff 50 ng/mL Barbiturates + metabolites, urine  Cutoff 200 ng/mL Benzodiazepine, urine              Cutoff 200 ng/mL Methadone, urine                   Cutoff 300 ng/mL  The urine drug screen provides only a preliminary, unconfirmed analytical test result and should not be used for non-medical purposes. Clinical consideration and professional judgment should be applied to any positive drug screen result due to possible interfering substances. A more specific alternate chemical method must be used in order to obtain a confirmed analytical result. Gas chromatography / mass spectrometry (GC/MS) is the preferred confirm                          atory method. Performed at [facility], [address] [place], [place] [address]    Glucose-Capillary 07/19/2023 110 (H)  70 - 99 mg/dL Final   Glucose reference range applies only to samples taken after fasting for at least 8 hours.   Specimen Description 07/19/2023 BLOOD LEFT ASSIST CONTROL   Final   Special Requests 07/19/2023 BOTTLES DRAWN AEROBIC AND ANAEROBIC Blood Culture adequate volume   Final   Culture 07/19/2023    Final                   Value:NO GROWTH 5 DAYS Performed at [employer]  [facility], [address] [place], [place] [address]    Report Status 07/19/2023 07/24/2023 FINAL   Final   Specimen Description 07/19/2023 BLOOD BLOOD RIGHT HAND   Final   Special Requests 07/19/2023 AEROBIC BOTTLE ONLY Blood Culture results may not be optimal due to an inadequate volume of blood received in culture bottles   Final   Culture 07/19/2023    Final                   Value:NO GROWTH 5 DAYS Performed at [facility], [address] [place], [place] [address]    Report Status 07/19/2023 07/24/2023 FINAL   Final   Lactic Acid, Venous 07/19/2023 6.3 (HH)  0.5 - 1.9 mmol/L Final   Comment:  CRITICAL RESULT CALLED TO, READ BACK BY AND VERIFIED WITH  ALEX [facility] AT 0250 07/19/23 JG Performed at [facility], [address] [place], [place] [address]    Lactic Acid, Venous 07/19/2023 1.3  0.5 - 1.9 mmol/L Final   Performed at [facility], [address] [place], [place] [address]   Total CK 07/19/2023 822 (H)  38 - 234 U/L Final   Performed at [facility], [address] [place], [place] [address]   Sodium 07/19/2023 138  135 - 145 mmol/L Final   Potassium 07/19/2023 4.5  3.5 - 5.1 mmol/L Final   Chloride 07/19/2023 106  98 - 111 mmol/L Final   CO2 07/19/2023 23  22 - 32 mmol/L Final   Glucose, Bld 07/19/2023 93  70 - 99 mg/dL Final   Glucose reference range applies only to samples taken after fasting for at least 8 hours.   BUN 07/19/2023 9  6 - 20 mg/dL Final   Creatinine, Ser 07/19/2023 0.59  0.44 - 1.00 mg/dL Final   Calcium  07/19/2023 8.4 (L)  8.9 - 10.3 mg/dL Final   GFR, Estimated 07/19/2023 >60  >60 mL/min Final   Comment: (NOTE) Calculated using the CKD-EPI Creatinine Equation (2021)    Anion gap 07/19/2023 9  5 - 15 Final   Performed at [facility], [address] [place], [place] [address]   WBC 07/19/2023 11.3 (H)  4.0 - 10.5 K/uL Final   RBC 07/19/2023 4.04  3.87 - 5.11 MIL/uL Final   Hemoglobin 07/19/2023 11.4 (L)  12.0 - 15.0 g/dL Final   HCT 57/84/6962 35.0 (L)  36.0 - 46.0 % Final   MCV 07/19/2023 86.6  80.0 - 100.0 fL Final   MCH 07/19/2023 28.2  26.0 - 34.0 pg Final   MCHC 07/19/2023 32.6  30.0 - 36.0 g/dL Final   RDW 95/28/4132 13.5  11.5 - 15.5 % Final   Platelets 07/19/2023 279  150 - 400 K/uL Final   nRBC 07/19/2023 0.0  0.0 - 0.2 % Final   Performed at [facility], [address] [place], [place] [address]   Troponin I (High Sensitivity) 07/19/2023 15  <18 ng/L Final   Comment: (NOTE) Elevated high sensitivity troponin I (hsTnI) values and significant  changes across serial measurements may  suggest ACS but many other  chronic and acute conditions are known to elevate hsTnI results.  Refer to the "Links" section for chest pain algorithms and additional  guidance. Performed at [facility], [address] [place], [place] [address]    Lactic Acid, Venous 07/19/2023 0.9  0.5 - 1.9 mmol/L Final   Performed at [facility], [address] [place], [place] [address]  Blood Alcohol level:  Lab Results  Component Value Date   [facility] <15 09/28/2023   ETH <10 07/29/2023    Metabolic Disorder Labs: Lab Results  Component Value Date   HGBA1C 5.5 07/11/2023   MPG 111.15 07/11/2023   MPG 111.15 02/04/2021   Lab Results  Component Value Date   PROLACTIN 39.5 (H) 04/12/2018   PROLACTIN 2.0 12/05/2011   Lab Results  Component Value Date   CHOL 184 08/01/2023   TRIG 153 (H) 08/01/2023   HDL 38 (L) 08/01/2023   CHOLHDL 4.8 08/01/2023   VLDL 31 08/01/2023   LDLCALC 115 (H) 08/01/2023   LDLCALC 109 (H) 09/05/2022    Therapeutic Lab Levels: Lab Results  Component Value Date   LITHIUM  0.33 (L) 10/02/2023   LITHIUM  0.65 08/05/2023   No results found for: "VALPROATE" No results found for: "CBMZ"  Physical Findings   AIMS    Flowsheet Row [facility]) [facility]) [facility] Admission (Discharged) from 05/14/2020 in [facility]PAT[facility] BE[facility]ICINE Admission (Discharged) from OP Visit from 08[facility]08[facility] Admission (Discharged) from 10/16/2018 in [facility] INPATIENT BEHAVIORAL MEDICINE  AIMS Total Score 0 0 0 0 0      AUDIT    Flowsheet Row Admission (Discharged) from 07/29/2023 in [facility]PAT[facility] MEDICINE Admission (Discharged) [facility] Ad[facility] (Discharged) [facility]  Admission (Discharged) from 05/14/2020 in [facility] INPATIENT BEHAVIORAL MEDICINE Admission (Discharged) from 09/25/2019 in [facility] INPATIENT BEHAVIORAL MEDICINE  Alcohol Use Disorder Identification Test Final Score (AUDIT) 0 4 1 3 4       PHQ2-9    Flowsheet Row ED from 09/28/2023 in [facility] Office Visit from 08/21/2023 in [facility] [place]. [facility] Office Visit from 07/07/2023 in [facility] [place]. [facility] Office Visit from 06/26/2023 in [facility] Of [place]. [facility] Office Visit from 06/22/2023 in [facility] [place]. [facility]  PHQ-2 Total Score 2 0 0 0 0  PHQ-9 Total Score 9 0 0 0 0      Flowsheet Row ED from 09/28/2023 in [facility] Most recent reading at 09/28/2023  6:47 AM ED from 09/28/2023 in [facility] Most recent reading at 09/28/2023  6:32 AM ED from 09/25/2023 in [facility] [place] Most recent reading at 09/25/2023  3:12 PM  C-SSRS RISK CATEGORY Moderate Risk Moderate Risk No Risk        Musculoskeletal  Strength & Muscle Tone: within normal limits Gait & Station: normal Patient leans: N/A  Psychiatric Specialty Exam  Presentation  General Appearance:  Appropriate for Environment; Well Groomed  Eye Contact: Good  Speech: Pressured; Clear and Coherent  Speech Volume: Normal  Handedness: Right   Mood and Affect  Mood: Euphoric ("Good and Hopeful")  Affect: Congruent; Appropriate   Thought Process  Thought Processes: Coherent  Descriptions of Associations:Tangential  Orientation:Full (Time, Place and Person)  Thought Content:Logical; Tangential  Diagnosis of Schizophrenia or Schizoaffective disorder in past: No  Duration of Psychotic Symptoms: Greater than six months    Hallucinations:Hallucinations: None  Ideas of Reference:None  Suicidal  Thoughts:Suicidal Thoughts: No SI Passive Intent and/or Plan: Without Intent  Homicidal Thoughts:Homicidal Thoughts: No   Sensorium  Memory: Immediate Fair; Recent Fair; Remote Fair  Judgment: Fair  Insight: Fair   Chartered certified accountant: Fair  Attention Span: Fair  Recall: [employer] of Knowledge: Fair  Language: Fair   Psychomotor Activity  Psychomotor Activity:Psychomotor Activity: Normal   Assets  Assets: Manufacturing systems engineer; Desire for Improvement; Resilience; Social Support   Sleep  Sleep:Sleep: Good   Physical Exam  Physical Exam Eyes:     Conjunctiva/sclera: Conjunctivae normal.  Pulmonary:     Effort: Pulmonary effort is normal.  Musculoskeletal:        General: Normal range of motion.     Cervical back: Normal range of motion.  Neurological:     Mental Status: She is alert and oriented to person, place, and time.  Psychiatric:        Mood and Affect: Mood normal.    Review of Systems  Constitutional:  Negative for chills, diaphoresis and fever.  HENT:  Negative for hearing loss, sore throat and tinnitus.   Eyes:  Negative for blurred vision and double vision.  Respiratory:  Negative for cough, shortness of breath and wheezing.   Cardiovascular:  Negative for chest pain and palpitations.  Gastrointestinal:  Negative for abdominal pain, constipation, diarrhea, nausea and vomiting.  Genitourinary:  Negative for dysuria, frequency and urgency.  Musculoskeletal:  Negative for myalgias.  Neurological:  Negative for dizziness and headaches.  Psychiatric/Behavioral:  Negative for depression, hallucinations and suicidal ideas. The patient is not nervous/anxious and does not have insomnia.    Blood pressure 132/85, pulse 97, temperature 97.6 F (36.4 C), temperature source Oral, resp. rate 18, SpO2 98%. There is no height or weight on file to calculate  BMI.  Assessment: Kelly Carr, is 53 y.o. female with a history of bipolar I disorder, PTSD, suicidal ideation, previous suicide attempts, and past psychiatric hospitalizations who presented voluntarily to [facility] and admitted to [facility] for suicidal ideation.   On assessment, patient continues to require inpatient hospitalization for stabilization of her mood symptoms and medication management. Patient presents much improved compared to her condition upon admission. Her lithium  levels have resulted as sub-therapeutic and patient was amenable to increasing lithium  dosage, stating that she was previously taking the medication three times per day. We will increase her Lithium  medication to 450 mg bid. She continues to present with tearful affect at times and requested to be connected to outpatient psychiatry and therapy to process her recent life changes. We will continue to monitor her condition, medication efficacy and tolerance, and consult social work to determine her disposition moving forward.  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 - Increase lithium  to 450 mg twice daily - Lithium  level on 10/02/23: 0.33 -- Continue Hydroxyzine  25 mg tid prn -- Continue Trazodone  50 mg prn  PTSD - Continue prazosin  1 mg nightly  Cocaine abuse -- Encourage cessation  - Residential rehab placement -- IVDU labs: HIV and Hepatitis panel still pending -- PRN Tylenol , maalox/mylanta, milk of magnesia   Tobacco Use Disorder -NRT ordered, discontinue before bedtime daily -Encourage smoking cessation  Park Bolk, Medical Student  I personally was present and performed or re-performed the history and medical decision-making activities of this service and have verified that the service and findings are accurately documented in the student's note.  Saratha Cunas, MD, PGY-2

## 2023-10-02 NOTE — ED Notes (Signed)
 Patient in the bedroom asleep with eyes closed. NAD. Respirations are even and unlabored. Environment secured per policy. Will monitoring for safety

## 2023-10-03 DIAGNOSIS — F4310 Post-traumatic stress disorder, unspecified: Secondary | ICD-10-CM | POA: Diagnosis not present

## 2023-10-03 DIAGNOSIS — R45851 Suicidal ideations: Secondary | ICD-10-CM | POA: Diagnosis not present

## 2023-10-03 DIAGNOSIS — Z5900 Homelessness unspecified: Secondary | ICD-10-CM | POA: Diagnosis not present

## 2023-10-03 DIAGNOSIS — F319 Bipolar disorder, unspecified: Secondary | ICD-10-CM | POA: Diagnosis not present

## 2023-10-03 NOTE — ED Notes (Signed)
 Patient is in the bedroom sleeping  NAD. Respirations are even and unlabored. Environment secured per policy. Will monitoring for safety

## 2023-10-03 NOTE — ED Provider Notes (Signed)
 Behavioral Health Progress Note  Date and Time: 10/03/2023 9:53 AM Name: Kelly Carr MRN:  [id number]  Subjective:  Amika Payeur, is 53 y.o. female with a history of bipolar I disorder, PTSD, suicidal ideation, previous suicide attempts, and past psychiatric hospitalizations who presented voluntarily to [facility] and admitted to [facility] for suicidal ideation.   Patient was assessed in her room in no acute distress. Patient presented alert and oriented to person, place, time, and situation. She reported her mood as "good" and stated that she had an "awesome" day yesterday. Over the last 24 hours she reported spending her time playing bingo and spending time outdoors when she could. She stated that her day did end sour when she was rejected by a few shelters. She endorsed sleep well last night. She also endorsed a good appetite with adequate hydration. Her last bowel movement was yesterday. She rated her anxiety and depression as 2/10 and 0/10, respectively. She stated that her anxiety was better because she had a list of resources from the social worker to help her plan the next steps. She reported normal energy levels with no new pains. She continued to endorse beneficial effects from her medications without any side effects from recent dosage changes. She denied any craving or withdrawal symptoms. She denied SI, HI, and AVH, and was not responding to internal stimuli during our conversation. She expressed some anxiety and uncertainty when it came to her next steps, saying she felt a little down when rejected by some shelter. She reported looking forward to talking with [employer] and [employer] later today. She reported no other concerns.  Diagnosis:  Final diagnoses:  Homelessness unspecified  Passive suicidal ideations  Ineffective coping  Recurrent major depressive disorder, remission status unspecified (HCC)    Total Time spent with patient: 20 minutes  Past Psychiatric History:  - Previous  Dx: Bipolar I, PTSD, GAD, MDD, ADD, and OCD - Current Psychiatric Medications: Abilify  IM 400 mg q 28 days, Hydroxyzine , Minipress , Lithium , Trazodone , and Fluphenazine  - patient reported not taking any aside from Abilify  IM; patient reported being prescribed Abilify  pill recently but stated she had not picked it up (chart review shows that patient has taken oral Abilify  in Feb/March 2025.) - Past Psychiatric Medications: and Depakote - Previous Psychiatrist/Therapist: Followed for many years by Dr. Dianne Fortune at [facility]; currently looking for new outpatient psychiatrist because of insurance. - Previous Hospitalizations: Extensive history of previous psychiatric hospitalizations - Previous Self-Harm/SI/Suicide Attempts: Endorses multiple instances of self harm and suicide attempts including multiple overdose attempts as a teenager and in 2014 and attempts to cut her wrists   Past Medical History:  - Previous Dx: Denied - Current Medications: Denied - Allergies: Denied any food allergies. Medication allergies include: Doxycycline , celecoxib , valproic acid, haloperidol , methylprednisolone , gabapentin , prednisone , risperidone and related, tramadol  - Hospitalizations: Denied - Surgeries: Denied   Family History:  - None reported.    Family Psychiatric  History:  - Dx: Mom- Hx of bipolar, schizophrenia, depression, and anxiety; Daughter- Hx of anxiety - Rx: Patient reported being unsure of their medications - Suicide attempts: History of attempts in mom - Homicide: Denied - Substance Use: Father - Alcohol use disorder   Social History:  - Residence: Current lives in [place], [place] but is unhoused; grew up in [place] - Job: Current unemployed, but worked at [employer] most recently - Education: Current enrolled to Public house manager Degree - Relationships: Never been married; no current partner - Children: 2 children - 28  yo son and 62 yo daughter - Firearm access: denied -  Stockpile of pills: denied   Additional Social History:  - Tobacco: Endorses intermitted use since the age of 64. Current smokes 1 pack per day with last use 2 days ago. - Alcohol: Endorses intermittent use since the age of 49. Period of sobriety during pregnancy. Current restarted occasional drinking in 2019, reported having one normal sized beer socially. Reported last drink of one beer on Sunday.  - Marijuana: Endorses intermitted use since 2019. Reported trying a vape pen "a few days ago." - Stimulant: Endorses recent methamphetamine and cocaine/crack use. Meth: Reports only every trying twice while part of recent trap house experience. Unsure of how much was used, but was smoked. Patient also reported being injected with an unknown drug by other residents in the house, assumed to be methamphetamines. Cocaine/Crack: Reports significant use starting 1 year ago. Most recently has been smoking an unknown quantity daily for the past two months. - Opioid: Denies any current or past history of opioid use, but does endorse past hospitalization where Narcan  was used on her. Patient reported that her other drugs may have been laced with fentanyl . - Benzo: Denied any current benzo use but endorsed past history of benzo use. Patient stated that last use was months to years ago.  Additional Social History:   Sleep: Good  Appetite:  Good  Current Medications:  Current Facility-Administered Medications  Medication Dose Route Frequency Provider Last Rate Last Admin   acetaminophen  (TYLENOL ) tablet 650 mg  650 mg Oral Q6H PRN Dorthea Gauze, NP   650 mg at 09/29/23 2125   alum & mag hydroxide-simeth (MAALOX/MYLANTA) 200-200-20 MG/5ML suspension 30 mL  30 mL Oral Q4H PRN Dorthea Gauze, NP   30 mL at 09/30/23 1606   fluticasone  (FLONASE ) 50 MCG/ACT nasal spray 1 spray  1 spray Each Nare Daily Lynnea Vandervoort B, MD   1 spray at 10/03/23 1610   hydrocortisone  cream 1 %   Topical BID PRN Marilou Showman, MD    Given at 10/01/23 1507   hydrOXYzine  (ATARAX ) tablet 25 mg  25 mg Oral TID PRN Parmar, Meenakshi, MD   25 mg at 10/02/23 2115   lithium  carbonate capsule 450 mg  450 mg Oral BID WC Curlie Sittner B, MD   450 mg at 10/03/23 9604   loratadine  (CLARITIN ) tablet 10 mg  10 mg Oral Daily [place]ib[place], Ene A, NP   10 mg at 10/03/23 5409   magnesium  hydroxide (MILK OF MAGNESIA) suspension 30 mL  30 mL Oral Daily PRN Dorthea Gauze, NP       nicotine  (NICODERM CQ  - dosed in mg/24 hours) patch 21 mg  21 mg Transdermal Daily Silas Drivers, MD   21 mg at 10/03/23 8119   OLANZapine  (ZYPREXA ) injection 10 mg  10 mg Intramuscular TID PRN Dorthea Gauze, NP       OLANZapine  (ZYPREXA ) injection 5 mg  5 mg Intramuscular TID PRN Dorthea Gauze, NP       OLANZapine  zydis (ZYPREXA ) disintegrating tablet 5 mg  5 mg Oral TID PRN Dorthea Gauze, NP       prazosin  (MINIPRESS ) capsule 1 mg  1 mg Oral QHS Parmar, Meenakshi, MD   1 mg at 10/02/23 2114   traZODone  (DESYREL ) tablet 50 mg  50 mg Oral QHS PRN Parmar, Meenakshi, MD   50 mg at 10/02/23 2114   Current Outpatient Medications  Medication Sig Dispense Refill   albuterol  (VENTOLIN  HFA) 108 (90 Base)  MCG/ACT inhaler Inhale 2 puffs into the lungs every 6 (six) hours as needed for wheezing or shortness of breath. 18 g 2   ARIPiprazole  (ABILIFY ) 20 MG tablet Take 20 mg by mouth daily.     cetirizine  (ZYRTEC  ALLERGY) 10 MG tablet Take 1 tablet (10 mg total) by mouth daily. 30 tablet 2   fluticasone  (FLONASE ) 50 MCG/ACT nasal spray Place 2 sprays into both nostrils daily for 15 days. 16 g 0   hydrOXYzine  (ATARAX ) 25 MG tablet Take 25 mg by mouth 3 (three) times daily as needed for anxiety.     prazosin  (MINIPRESS ) 1 MG capsule Take 1 capsule (1 mg total) by mouth at bedtime. 30 capsule 0   fluPHENAZine  (PROLIXIN ) 10 MG tablet Take 1 tablet (10 mg total) by mouth at bedtime. (Patient not taking: Reported on 09/28/2023) 30 tablet 0   traZODone  (DESYREL ) 50 MG tablet Take 1 tablet  (50 mg total) by mouth at bedtime. (Patient not taking: Reported on 09/28/2023) 30 tablet 0    Labs  Lab Results:  Admission on 09/28/2023  Component Date Value Ref Range Status   SARS Coronavirus 2 by RT PCR 09/28/2023 NEGATIVE  NEGATIVE Final   Performed at [facility], [address], [place], [place] [address]   WBC 09/28/2023 8.6  4.0 - 10.5 K/uL Final   RBC 09/28/2023 4.52  3.87 - 5.11 MIL/uL Final   Hemoglobin 09/28/2023 12.6  12.0 - 15.0 g/dL Final   HCT 11/91/4782 39.3  36.0 - 46.0 % Final   MCV 09/28/2023 86.9  80.0 - 100.0 fL Final   MCH 09/28/2023 27.9  26.0 - 34.0 pg Final   MCHC 09/28/2023 32.1  30.0 - 36.0 g/dL Final   RDW 95/62/1308 13.2  11.5 - 15.5 % Final   Platelets 09/28/2023 268  150 - 400 K/uL Final   nRBC 09/28/2023 0.0  0.0 - 0.2 % Final   Neutrophils Relative % 09/28/2023 60  % Final   Neutro Abs 09/28/2023 5.2  1.7 - 7.7 K/uL Final   Lymphocytes Relative 09/28/2023 30  % Final   Lymphs Abs 09/28/2023 2.6  0.7 - 4.0 K/uL Final   Monocytes Relative 09/28/2023 8  % Final   Monocytes Absolute 09/28/2023 0.7  0.1 - 1.0 K/uL Final   Eosinophils Relative 09/28/2023 1  % Final   Eosinophils Absolute 09/28/2023 0.1  0.0 - 0.5 K/uL Final   Basophils Relative 09/28/2023 1  % Final   Basophils Absolute 09/28/2023 0.0  0.0 - 0.1 K/uL Final   Immature Granulocytes 09/28/2023 0  % Final   Abs Immature Granulocytes 09/28/2023 0.03  0.00 - 0.07 K/uL Final   Performed at [facility], [address], [place], [place] [address]   Sodium 09/28/2023 140  135 - 145 mmol/L Final   Potassium 09/28/2023 3.6  3.5 - 5.1 mmol/L Final   Chloride 09/28/2023 106  98 - 111 mmol/L Final   CO2 09/28/2023 26  22 - 32 mmol/L Final   Glucose, Bld 09/28/2023 101 (H)  70 - 99 mg/dL Final   Glucose reference range applies only to samples taken after fasting for at least 8 hours.   BUN 09/28/2023 6  6 - 20 mg/dL Final   Creatinine, Ser 09/28/2023 0.63  0.44 - 1.00 mg/dL Final    Calcium  09/28/2023 9.3  8.9 - 10.3 mg/dL Final   Total Protein 69/62/9528 6.4 (L)  6.5 - 8.1 g/dL Final   Albumin 41/32/4401 3.6  3.5 - 5.0 g/dL Final   AST 16/03/9603 21  15 - 41 U/L Final   ALT 09/28/2023 27  0 - 44 U/L Final   Alkaline Phosphatase 09/28/2023 74  38 - 126 U/L Final   Total Bilirubin 09/28/2023 0.9  0.0 - 1.2 mg/dL Final   GFR, Estimated 09/28/2023 >60  >60 mL/min Final   Comment: (NOTE) Calculated using the CKD-EPI Creatinine Equation (2021)    Anion gap 09/28/2023 8  5 - 15 Final   Performed at [facility], [address] [place], [place] [address]   Alcohol, Ethyl (B) 09/28/2023 <15  <15 mg/dL Final   Comment: Please note change in reference range. (NOTE) For medical purposes only. Performed at [facility], [address], [place], [place] [address]    TSH 09/28/2023 2.425  0.350 - 4.500 uIU/mL Final   Comment: Performed by a 3rd Generation assay with a functional sensitivity of <=0.01 uIU/mL. Performed at [facility], [address], [place], [place] [address]    Preg Test, Ur 09/28/2023 Negative  Negative Final   POC Amphetamine UR 09/28/2023 None Detected  NONE DETECTED (Cut Off Level 1000 ng/mL) Final   POC Secobarbital (BAR) 09/28/2023 None Detected  NONE DETECTED (Cut Off Level 300 ng/mL) Final   POC Buprenorphine (BUP) 09/28/2023 None Detected  NONE DETECTED (Cut Off Level 10 ng/mL) Final   POC Oxazepam (BZO) 09/28/2023 None Detected  NONE DETECTED (Cut Off Level 300 ng/mL) Final   POC Cocaine UR 09/28/2023 Positive (A)  NONE DETECTED (Cut Off Level 300 ng/mL) Final   POC Methamphetamine UR 09/28/2023 None Detected  NONE DETECTED (Cut Off Level 1000 ng/mL) Final   POC Morphine 09/28/2023 None Detected  NONE DETECTED (Cut Off Level 300 ng/mL) Final   POC Methadone UR 09/28/2023 None Detected  NONE DETECTED (Cut Off Level 300 ng/mL) Final   POC Oxycodone  UR 09/28/2023 None Detected  NONE DETECTED (Cut Off Level 100 ng/mL) Final   POC  Marijuana UR 09/28/2023 Positive (A)  NONE DETECTED (Cut Off Level 50 ng/mL) Final   Preg Test, Ur 09/28/2023 NEGATIVE  NEGATIVE Final   Comment:        THE SENSITIVITY OF THIS METHODOLOGY IS >24 mIU/mL    Lithium  Lvl 10/02/2023 0.33 (L)  0.60 - 1.20 mmol/L Final   Performed at [facility], [address] [place], [place] [address]  Admission on 09/25/2023, Discharged on 09/25/2023  Component Date Value Ref Range Status   Preg Test, Ur 09/25/2023 Negative  Negative Final   Neisseria Gonorrhea 09/25/2023 Negative   Final   Chlamydia 09/25/2023 Positive (A)   Final   Trichomonas 09/25/2023 Negative   Final   Bacterial Vaginitis (gardnerella) 09/25/2023 Positive (A)   Final   Candida Vaginitis 09/25/2023 Negative   Final   Candida Glabrata 09/25/2023 Negative   Final   Comment 09/25/2023 Normal Reference Range Bacterial Vaginosis - Negative   Final   Comment 09/25/2023 Normal Reference Range Candida Species - Negative   Final   Comment 09/25/2023 Normal Reference Range Candida Galbrata - Negative   Final   Comment 09/25/2023 Normal Reference Range Trichomonas - Negative   Final   Comment 09/25/2023 Normal Reference Ranger Chlamydia - Negative   Final   Comment 09/25/2023 Normal Reference Range Neisseria Gonorrhea - Negative   Final   HIV Screen 4th Generation wRfx 09/25/2023 Non Reactive  Non Reactive Final   Performed at [facility], [address], [place], [place] [address]   RPR Ser Ql 09/25/2023 NON REACTIVE  NON REACTIVE Final   Performed at [facility], [address], [place], [place] [address]  Admission on 07/29/2023, Discharged on 08/08/2023  Component Date Value Ref Range Status   Cholesterol 08/01/2023 184  0 - 200 mg/dL Final   Triglycerides 52/84/1324 153 (H)  <150 mg/dL Final   HDL 40/03/2724 38 (L)  >40 mg/dL Final   Total CHOL/HDL Ratio 08/01/2023 4.8  RATIO Final   VLDL 08/01/2023 31  0 - 40 mg/dL Final   LDL Cholesterol 08/01/2023 115 (H)  0 -  99 mg/dL Final   Comment:        Total Cholesterol/HDL:CHD Risk Coronary Heart Disease Risk Table                     Men   Women  1/2 Average Risk   3.4   3.3  Average Risk       5.0   4.4  2 X Average Risk   9.6   7.1  3 X Average Risk  23.4   11.0        Use the calculated Patient Ratio above and the CHD Risk Table to determine the patient's CHD Risk.        ATP III CLASSIFICATION (LDL):  <100     mg/dL   Optimal  366-440  mg/dL   Near or Above                    Optimal  130-159  mg/dL   Borderline  347-425  mg/dL   High  >956     mg/dL   Very High Performed at [facility], [address] [place], [place] [address]    Lithium  Lvl 08/05/2023 0.65  0.60 - 1.20 mmol/L Final   Performed at [facility], [address] [place], [place] [address]  Admission on 07/29/2023, Discharged on 07/29/2023  Component Date Value Ref Range Status   Sodium 07/29/2023 137  135 - 145 mmol/L Final   Potassium 07/29/2023 3.5  3.5 - 5.1 mmol/L Final   Chloride 07/29/2023 103  98 - 111 mmol/L Final   CO2 07/29/2023 21 (L)  22 - 32 mmol/L Final   Glucose, Bld 07/29/2023 112 (H)  70 - 99 mg/dL Final   Glucose reference range applies only to samples taken after fasting for at least 8 hours.   BUN 07/29/2023 11  6 - 20 mg/dL Final   Creatinine, Ser 07/29/2023 0.57  0.44 - 1.00 mg/dL Final   Calcium  07/29/2023 8.7 (L)  8.9 - 10.3 mg/dL Final   Total Protein 51/88/4166 6.8  6.5 - 8.1 g/dL Final   Albumin 12/04/1599 3.8  3.5 - 5.0 g/dL Final   AST 09/32/3557 41  15 - 41 U/L Final   ALT 07/29/2023 54 (H)  0 - 44 U/L Final   Alkaline Phosphatase 07/29/2023 76  38 - 126 U/L Final   Total Bilirubin 07/29/2023 0.8  0.0 - 1.2 mg/dL Final   GFR, Estimated 07/29/2023 >60  >60 mL/min Final   Comment: (NOTE) Calculated using the CKD-EPI Creatinine Equation (2021)    Anion gap 07/29/2023 13  5 - 15 Final   Performed at [facility], [address] [place], [place] [address]    Alcohol, Ethyl (B) 07/29/2023 <10  <10 mg/dL Final   Comment: (NOTE) Lowest detectable limit for serum alcohol  is 10 mg/dL.  For medical purposes only. Performed at [facility], [address] [place], [place] [address]    Salicylate Lvl 07/29/2023 <7.0 (L)  7.0 - 30.0 mg/dL Final   Performed at [facility], [address] [place], [place] [address]   Acetaminophen  (Tylenol ), Serum 07/29/2023 <10 (L)  10 - 30 ug/mL Final   Comment: (NOTE) Therapeutic concentrations vary significantly. A range of 10-30 ug/mL  may be an effective concentration for many patients. However, some  are best treated at concentrations outside of this range. Acetaminophen  concentrations >150 ug/mL at 4 hours after ingestion  and >50 ug/mL at 12 hours after ingestion are often associated with  toxic reactions.  Performed at [facility], [address] [place], [place] [address]    WBC 07/29/2023 6.7  4.0 - 10.5 K/uL Final   RBC 07/29/2023 4.15  3.87 - 5.11 MIL/uL Final   Hemoglobin 07/29/2023 11.8 (L)  12.0 - 15.0 g/dL Final   HCT 40/34/7425 36.5  36.0 - 46.0 % Final   MCV 07/29/2023 88.0  80.0 - 100.0 fL Final   MCH 07/29/2023 28.4  26.0 - 34.0 pg Final   MCHC 07/29/2023 32.3  30.0 - 36.0 g/dL Final   RDW 95/63/8756 13.6  11.5 - 15.5 % Final   Platelets 07/29/2023 327  150 - 400 K/uL Final   nRBC 07/29/2023 0.0  0.0 - 0.2 % Final   Performed at [facility], [address] [place], [place] [address]   Tricyclic, Ur Screen 07/29/2023 POSITIVE (A)  NONE DETECTED Final   Amphetamines, Ur Screen 07/29/2023 NONE DETECTED  NONE DETECTED Final   MDMA (Ecstasy)Ur Screen 07/29/2023 NONE DETECTED  NONE DETECTED Final   Cocaine Metabolite,Ur [place] 07/29/2023 NONE DETECTED  NONE DETECTED Final   Opiate, Ur Screen 07/29/2023 NONE DETECTED  NONE DETECTED Final   Phencyclidine (PCP) Ur S 07/29/2023 NONE DETECTED  NONE DETECTED Final   Cannabinoid 50 Ng, Ur [place] 07/29/2023  POSITIVE (A)  NONE DETECTED Final   Barbiturates, Ur Screen 07/29/2023 NONE DETECTED  NONE DETECTED Final   Benzodiazepine, Ur Scrn 07/29/2023 POSITIVE (A)  NONE DETECTED Final   Methadone Scn, Ur 07/29/2023 NONE DETECTED  NONE DETECTED Final   Comment: (NOTE) Tricyclics + metabolites, urine    Cutoff 1000 ng/mL Amphetamines + metabolites, urine  Cutoff 1000 ng/mL MDMA (Ecstasy), urine              Cutoff 500 ng/mL Cocaine Metabolite, urine          Cutoff 300 ng/mL Opiate + metabolites, urine        Cutoff 300 ng/mL Phencyclidine (PCP), urine         Cutoff 25 ng/mL Cannabinoid, urine                 Cutoff 50 ng/mL Barbiturates + metabolites, urine  Cutoff 200 ng/mL Benzodiazepine, urine              Cutoff 200 ng/mL Methadone, urine                   Cutoff 300 ng/mL  The urine drug screen provides only a preliminary, unconfirmed analytical test result and should not be used for non-medical purposes. Clinical consideration and professional judgment should be applied to any positive drug screen result due to possible interfering substances. A more specific alternate chemical method must be used in order to obtain a confirmed analytical result. Gas chromatography / mass spectrometry (  GC/MS) is the preferred confirm                          atory method. Performed at [facility], [address] [place], [place] [address]    Lithium  Lvl 07/29/2023 <0.06 (L)  0.60 - 1.20 mmol/L Final   Performed at [facility], [address] [place], [place] [address]  Admission on 07/19/2023, Discharged on 07/19/2023  Component Date Value Ref Range Status   WBC 07/19/2023 14.7 (H)  4.0 - 10.5 K/uL Final   RBC 07/19/2023 4.76  3.87 - 5.11 MIL/uL Final   Hemoglobin 07/19/2023 13.6  12.0 - 15.0 g/dL Final   HCT 09/81/1914 43.1  36.0 - 46.0 % Final   MCV 07/19/2023 90.5  80.0 - 100.0 fL Final   MCH 07/19/2023 28.6  26.0 - 34.0 pg Final   MCHC 07/19/2023 31.6  30.0 - 36.0 g/dL  Final   RDW 78/29/5621 13.6  11.5 - 15.5 % Final   Platelets 07/19/2023 378  150 - 400 K/uL Final   nRBC 07/19/2023 0.0  0.0 - 0.2 % Final   Neutrophils Relative % 07/19/2023 80  % Final   Neutro Abs 07/19/2023 11.7 (H)  1.7 - 7.7 K/uL Final   Lymphocytes Relative 07/19/2023 13  % Final   Lymphs Abs 07/19/2023 2.0  0.7 - 4.0 K/uL Final   Monocytes Relative 07/19/2023 6  % Final   Monocytes Absolute 07/19/2023 0.8  0.1 - 1.0 K/uL Final   Eosinophils Relative 07/19/2023 0  % Final   Eosinophils Absolute 07/19/2023 0.0  0.0 - 0.5 K/uL Final   Basophils Relative 07/19/2023 0  % Final   Basophils Absolute 07/19/2023 0.0  0.0 - 0.1 K/uL Final   Immature Granulocytes 07/19/2023 1  % Final   Abs Immature Granulocytes 07/19/2023 0.07  0.00 - 0.07 K/uL Final   Performed at [facility], [address] [place], [place] [address]   Sodium 07/19/2023 142  135 - 145 mmol/L Final   Potassium 07/19/2023 4.6  3.5 - 5.1 mmol/L Final   Chloride 07/19/2023 107  98 - 111 mmol/L Final   CO2 07/19/2023 15 (L)  22 - 32 mmol/L Final   Glucose, Bld 07/19/2023 113 (H)  70 - 99 mg/dL Final   Glucose reference range applies only to samples taken after fasting for at least 8 hours.   BUN 07/19/2023 8  6 - 20 mg/dL Final   Creatinine, Ser 07/19/2023 0.71  0.44 - 1.00 mg/dL Final   Calcium  07/19/2023 9.5  8.9 - 10.3 mg/dL Final   Total Protein 78/46/9629 7.8  6.5 - 8.1 g/dL Final   Albumin 52/84/1324 4.2  3.5 - 5.0 g/dL Final   AST 40/03/2724 44 (H)  15 - 41 U/L Final   ALT 07/19/2023 49 (H)  0 - 44 U/L Final   Alkaline Phosphatase 07/19/2023 82  38 - 126 U/L Final   Total Bilirubin 07/19/2023 0.8  0.0 - 1.2 mg/dL Final   GFR, Estimated 07/19/2023 >60  >60 mL/min Final   Comment: (NOTE) Calculated using the CKD-EPI Creatinine Equation (2021)    Anion gap 07/19/2023 20 (H)  5 - 15 Final   Performed at [facility], [address] [place], [place] [address]   Troponin I (High Sensitivity)  07/19/2023 6  <18 ng/L Final   Comment: (NOTE) Elevated high sensitivity troponin I (hsTnI) values and significant  changes across serial measurements  may suggest ACS but many other  chronic and acute conditions are known to elevate hsTnI results.  Refer to the "Links" section for chest pain algorithms and additional  guidance. Performed at [facility], [address] [place], [place] [address]    Color, Urine 07/19/2023 YELLOW (A)  YELLOW Final   APPearance 07/19/2023 CLEAR (A)  CLEAR Final   Specific Gravity, Urine 07/19/2023 1.008  1.005 - 1.030 Final   pH 07/19/2023 6.0  5.0 - 8.0 Final   Glucose, UA 07/19/2023 NEGATIVE  NEGATIVE mg/dL Final   Hgb urine dipstick 07/19/2023 NEGATIVE  NEGATIVE Final   Bilirubin Urine 07/19/2023 NEGATIVE  NEGATIVE Final   Ketones, ur 07/19/2023 5 (A)  NEGATIVE mg/dL Final   Protein, ur 60/45/4098 NEGATIVE  NEGATIVE mg/dL Final   Nitrite 11/91/4782 NEGATIVE  NEGATIVE Final   Leukocytes,Ua 07/19/2023 NEGATIVE  NEGATIVE Final   Performed at [facility], [address] [place], [place] [address]   Tricyclic, Ur Screen 07/19/2023 NONE DETECTED  NONE DETECTED Final   Amphetamines, Ur Screen 07/19/2023 NONE DETECTED  NONE DETECTED Final   MDMA (Ecstasy)Ur Screen 07/19/2023 NONE DETECTED  NONE DETECTED Final   Cocaine Metabolite,Ur [place] 07/19/2023 NONE DETECTED  NONE DETECTED Final   Opiate, Ur Screen 07/19/2023 POSITIVE (A)  NONE DETECTED Final   Phencyclidine (PCP) Ur S 07/19/2023 NONE DETECTED  NONE DETECTED Final   Cannabinoid 50 Ng, Ur  07/19/2023 POSITIVE (A)  NONE DETECTED Final   Barbiturates, Ur Screen 07/19/2023 NONE DETECTED  NONE DETECTED Final   Benzodiazepine, Ur Scrn 07/19/2023 NONE DETECTED  NONE DETECTED Final   Methadone Scn, Ur 07/19/2023 NONE DETECTED  NONE DETECTED Final   Comment: (NOTE) Tricyclics + metabolites, urine    Cutoff 1000 ng/mL Amphetamines + metabolites, urine  Cutoff 1000 ng/mL MDMA (Ecstasy),  urine              Cutoff 500 ng/mL Cocaine Metabolite, urine          Cutoff 300 ng/mL Opiate + metabolites, urine        Cutoff 300 ng/mL Phencyclidine (PCP), urine         Cutoff 25 ng/mL Cannabinoid, urine                 Cutoff 50 ng/mL Barbiturates + metabolites, urine  Cutoff 200 ng/mL Benzodiazepine, urine              Cutoff 200 ng/mL Methadone, urine                   Cutoff 300 ng/mL  The urine drug screen provides only a preliminary, unconfirmed analytical test result and should not be used for non-medical purposes. Clinical consideration and professional judgment should be applied to any positive drug screen result due to possible interfering substances. A more specific alternate chemical method must be used in order to obtain a confirmed analytical result. Gas chromatography / mass spectrometry (GC/MS) is the preferred confirm                          atory method. Performed at [facility], [address] [place], [place] [address]    Glucose-Capillary 07/19/2023 110 (H)  70 - 99 mg/dL Final   Glucose reference range applies only to samples taken after fasting for at least 8 hours.   Specimen Description 07/19/2023 BLOOD LEFT ASSIST CONTROL   Final   Special Requests 07/19/2023 BOTTLES DRAWN AEROBIC AND  ANAEROBIC Blood Culture adequate volume   Final   Culture 07/19/2023    Final                   Value:NO GROWTH 5 DAYS Performed at [facility], [address] [place] [place], [place] [address]    Report Status 07/19/2023 07/24/2023 FINAL   Final   Specimen Description 07/19/2023 BLOOD BLOOD RIGHT HAND   Final   Special Requests 07/19/2023 AEROBIC BOTTLE ONLY Blood Culture results may not be optimal due to an inadequate volume of blood received in culture bottles   Final   Culture 07/19/2023    Final                   Value:NO GROWTH 5 DAYS Performed at [facility], [address] [place], [place] [address]    Report Status 07/19/2023  07/24/2023 FINAL   Final   Lactic Acid, Venous 07/19/2023 6.3 (HH)  0.5 - 1.9 mmol/L Final   Comment: CRITICAL RESULT CALLED TO, READ BACK BY AND VERIFIED WITH  ALEX [facility] AT 0250 07/19/23 JG Performed at [facility], [address] [place], [place] [address]    Lactic Acid, Venous 07/19/2023 1.3  0.5 - 1.9 mmol/L Final   Performed at [facility], [address] [place], [place] [address]   Total CK 07/19/2023 822 (H)  38 - 234 U/L Final   Performed at [facility], [address] [place], [place] [address]   Sodium 07/19/2023 138  135 - 145 mmol/L Final   Potassium 07/19/2023 4.5  3.5 - 5.1 mmol/L Final   Chloride 07/19/2023 106  98 - 111 mmol/L Final   CO2 07/19/2023 23  22 - 32 mmol/L Final   Glucose, Bld 07/19/2023 93  70 - 99 mg/dL Final   Glucose reference range applies only to samples taken after fasting for at least 8 hours.   BUN 07/19/2023 9  6 - 20 mg/dL Final   Creatinine, Ser 07/19/2023 0.59  0.44 - 1.00 mg/dL Final   Calcium  07/19/2023 8.4 (L)  8.9 - 10.3 mg/dL Final   GFR, Estimated 07/19/2023 >60  >60 mL/min Final   Comment: (NOTE) Calculated using the CKD-EPI Creatinine Equation (2021)    Anion gap 07/19/2023 9  5 - 15 Final   Performed at [facility], [address] [place], [place] [address]   WBC 07/19/2023 11.3 (H)  4.0 - 10.5 K/uL Final   RBC 07/19/2023 4.04  3.87 - 5.11 MIL/uL Final   Hemoglobin 07/19/2023 11.4 (L)  12.0 - 15.0 g/dL Final   HCT 57/84/6962 35.0 (L)  36.0 - 46.0 % Final   MCV 07/19/2023 86.6  80.0 - 100.0 fL Final   MCH 07/19/2023 28.2  26.0 - 34.0 pg Final   MCHC 07/19/2023 32.6  30.0 - 36.0 g/dL Final   RDW 95/28/4132 13.5  11.5 - 15.5 % Final   Platelets 07/19/2023 279  150 - 400 K/uL Final   nRBC 07/19/2023 0.0  0.0 - 0.2 % Final   Performed at [facility], [address] [place], [place] [address]   Troponin I (High Sensitivity) 07/19/2023 15  <18 ng/L Final   Comment:  (NOTE) Elevated high sensitivity troponin I (hsTnI) values and significant  changes across serial measurements may suggest ACS but many other  chronic and acute conditions are known to elevate hsTnI results.  Refer to the "Links" section for chest pain algorithms and additional  guidance. Performed at [facility], [address] [place], [place] [address]    Lactic Acid, Venous 07/19/2023 0.9  0.5 - 1.9 mmol/L Final   Performed at [facility], [address] [place], [place] [address]    Blood Alcohol level:  Lab Results  Component Value Date   [facility] <15 09/28/2023   ETH <10 07/29/2023    Metabolic Disorder Labs: Lab Results  Component Value Date   HGBA1C 5.5 07/11/2023   MPG 111.15 07/11/2023   MPG 111.15 02/04/2021   Lab Results  Component Value Date   PROLACTIN 39.5 (H) 04/12/2018   PROLACTIN 2.0 12/05/2011   Lab Results  Component Value Date   CHOL 184 08/01/2023   TRIG 153 (H) 08/01/2023   HDL 38 (L) 08/01/2023   CHOLHDL 4.8 08/01/2023   VLDL 31 08/01/2023   LDLCALC 115 (H) 08/01/2023   LDLCALC 109 (H) 09/05/2022    Therapeutic Lab Levels: Lab Results  Component Value Date   LITHIUM  0.33 (L) 10/02/2023   LITHIUM  0.65 08/05/2023   No results found for: "VALPROATE" No results found for: "CBMZ"  Physical Findings   AIMS    Flowsheet Row [facility]) [facility]) [facility] Admission (Discharged) from 05/14/2020 in [facility]PATIENT BEHAV[facility]ICINE Admission (Discharged) from OP Visit from 08/08/2019 [facility] Admission (Discharged) from 10/16/2018 in [facility]PATIENT [facility] MEDICINE  AIMS Total Score 0 0 0 0 0      AUDIT    Flowsheet Row Admission (Discharged) from 07/29/2023 in [facility]PATIENT BE[facility] MEDICINE Admission (Discharged) [facility] Admission (Discharged) from [facility] Admission (Discharged) from 05/14/2020 in [facility]PATIENT BEHAVIORAL MEDICINE Admission (Discharged) from 09/25/2019 in [facility] INPATIENT BEHAVIORAL MEDICINE  Alcohol Use Disorder Identification Test Final Score (AUDIT) 0 4 1 3 4       PHQ2-9    Flowsheet Row ED from 09/28/2023 in [facility] Office Visit from 08/21/2023 in [facility] [place]. [facility] Office Visit from 07/07/2023 in [facility] [place] Office Visit from 06/26/2023 in [facility] [place] Office Visit from 06/22/2023 in [facility] Of . [facility]  PHQ-2 Total Score 2 0 0 0 0  PHQ-9 Total Score 9 0 0 0 0      Flowsheet Row ED from 09/28/2023 in [facility] Most recent reading at 09/28/2023  6:47 AM ED from 09/28/2023 in [facility] Most recent reading at 09/28/2023  6:32 AM ED from 09/25/2023 in [facility] [place] Most recent reading at 09/25/2023  3:12 PM  C-SSRS RISK CATEGORY Moderate Risk Moderate Risk No Risk        Musculoskeletal  Strength & Muscle Tone: within normal limits Gait & Station: normal Patient leans: N/A  Psychiatric Specialty Exam  Presentation  General Appearance:  Appropriate for Environment; Well Groomed  Eye Contact: Good  Speech: Clear and Coherent; Normal Rate  Speech Volume: Normal  Handedness: Right   Mood and Affect  Mood: Anxious ("Good")  Affect: Congruent; Appropriate   Thought Process  Thought Processes: Coherent; Goal  Directed  Descriptions of Associations:Tangential  Orientation:Full (Time, Place and Person)  Thought Content:Rumination; Logical  Diagnosis of Schizophrenia or  Schizoaffective disorder in past: No   Hallucinations:Hallucinations: None  Ideas of Reference:None  Suicidal Thoughts:Suicidal Thoughts: No SI Passive Intent and/or Plan: Without Intent  Homicidal Thoughts:Homicidal Thoughts: No   Sensorium  Memory: Immediate Fair; Recent Fair; Remote Fair  Judgment: Fair  Insight: Fair   Chartered certified accountant: Fair  Attention Span: Fair  Recall: [employer] of Knowledge: Fair  Language: Fair   Psychomotor Activity  Psychomotor Activity: Psychomotor Activity: Normal   Assets  Assets: Communication Skills; Desire for Improvement; Resilience; Social Support   Sleep  Sleep: Sleep: Good   Physical Exam  Physical Exam Eyes:     Conjunctiva/sclera: Conjunctivae normal.  Pulmonary:     Effort: Pulmonary effort is normal.  Musculoskeletal:        General: Normal range of motion.     Cervical back: Normal range of motion.  Neurological:     Mental Status: She is alert and oriented to person, place, and time.  Psychiatric:        Mood and Affect: Mood normal.    Review of Systems  Constitutional:  Negative for chills, diaphoresis and fever.  HENT:  Negative for hearing loss, sore throat and tinnitus.   Eyes:  Negative for blurred vision and double vision.  Respiratory:  Negative for cough, shortness of breath and wheezing.   Cardiovascular:  Negative for chest pain.  Gastrointestinal:  Negative for abdominal pain, constipation, diarrhea, nausea and vomiting.  Genitourinary:  Negative for dysuria, frequency and urgency.  Musculoskeletal:  Negative for myalgias.  Neurological:  Negative for dizziness and headaches.  Psychiatric/Behavioral:  Positive for substance abuse. Negative for depression, hallucinations and suicidal ideas. The patient is nervous/anxious. The patient does not have insomnia.    Blood pressure 124/88, pulse 85, temperature 97.6 F (36.4 C), temperature source Oral, resp. rate 17,  SpO2 100%. There is no height or weight on file to calculate BMI.  Assessment: Rosealynn Woldt, is 53 y.o. female with a history of bipolar I disorder, PTSD, suicidal ideation, previous suicide attempts, and past psychiatric hospitalizations who presented voluntarily to [facility] and admitted to [facility] for suicidal ideation.    On assessment, patient continues to require inpatient hospitalization for medication management and disposition planning. Compared to admission, patient presents much improved with better control of her mood, anxiety, and depression. Her recent lithium  levels resulted sub-therapeutic and her medication was increased to 450 mg bid (patient was previously on 600 bid). She continues to request resources to connect with outpatient psychiatry and therapy to process her life changes. She is in a stable state with anxiety arising when talking about her disposition. She is interested in finding a shelter where she can get back on her feet. We will continue to monitor her condition, medications, and disposition planning with hopes to discharge her tomorrow to help her get back on her feet.  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 - Continue lithium  450 mg twice daily - Lithium  level on 10/02/23: 0.33 -- Continue Hydroxyzine  25 mg tid prn -- Continue Trazodone  50 mg prn   PTSD - Continue prazosin  1 mg nightly   Cocaine abuse -- Encourage cessation  - Residential rehab placement -- IVDU labs: HIV and Hepatitis panel still pending -- PRN Tylenol , maalox/mylanta, milk of magnesia    Tobacco Use Disorder -NRT ordered, discontinue before bedtime daily -  Encourage smoking cessation   Park Bolk, Medical Student  I personally was present and performed or re-performed the history and medical decision-making activities of this service and have verified that the service and findings are accurately documented in the student's note.  Saratha Cunas, MD, PGY-2
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
## 2023-10-03 NOTE — ED Notes (Signed)
 Patient is awake and alert on unit.  She is in dayroom at this time and is social with select peers.  No issue or complaint.  Paitnient awaiting placement.  Will monitor.

## 2023-10-03 NOTE — Discharge Planning (Signed)
 SW met with patient to provide a packet with additional community resources including women's shelters and a new list of [facility] openings. Pt is going to continue to call [facility] and has an phone screening appointment with an [facility] tonight at [employer]. She was encouraged to call the other [facility] as well as the [employer] shelters in the [place] and [place] areas. Will continue to follow.

## 2023-10-03 NOTE — ED Notes (Signed)
 Patient is sleeping. Respirations equal and unlabored, skin warm and dry. No change in assessment or acuity. Routine safety checks conducted according to facility protocol. Will continue to monitor for safety.

## 2023-10-03 NOTE — ED Notes (Signed)
 Pt is in the dayroom watching TV with peers. Pt reports she has interview with [facility] at 21:15 and would wants staff to remind her so she doesn't miss the appointment. Pt denies SI/HI/AVH. Pt has no further complain.No acute distress noted. Will continue to monitor for safety and provide support.
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
## 2023-10-04 DIAGNOSIS — F319 Bipolar disorder, unspecified: Secondary | ICD-10-CM | POA: Diagnosis not present

## 2023-10-04 DIAGNOSIS — R45851 Suicidal ideations: Secondary | ICD-10-CM | POA: Diagnosis not present

## 2023-10-04 DIAGNOSIS — F4310 Post-traumatic stress disorder, unspecified: Secondary | ICD-10-CM | POA: Diagnosis not present

## 2023-10-04 DIAGNOSIS — Z5900 Homelessness unspecified: Secondary | ICD-10-CM | POA: Diagnosis not present

## 2023-10-04 MED ORDER — PRAZOSIN HCL 1 MG PO CAPS: 1.0000 mg | ORAL_CAPSULE | Freq: Every day | ORAL | 0 refills | Status: AC

## 2023-10-04 MED ORDER — NICOTINE 21 MG/24HR TD PT24: 21.0000 mg | MEDICATED_PATCH | Freq: Every day | TRANSDERMAL | 0 refills | Status: AC

## 2023-10-04 MED ORDER — LITHIUM CARBONATE 150 MG PO CAPS: 450.0000 mg | ORAL_CAPSULE | Freq: Two times a day (BID) | ORAL | 0 refills | Status: AC

## 2023-10-04 NOTE — ED Notes (Signed)
 Patient on phone in hallway, calm and composed. No acute distress noted. No concerns voiced. Informed patient to notify staff with any needs or assistance. Patient verbalized understanding or agreement. Safety checks in place per facility policy.
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
## 2023-10-04 NOTE — Discharge Instructions (Signed)

## 2023-10-04 NOTE — ED Provider Notes (Signed)
 Behavioral Health Progress Note  Date and Time: 10/04/2023 9:59 AM Name: Kelly Carr MRN:  [id number]  Subjective:   Kelly Carr, is 53 y.o. female with a history of bipolar I disorder, PTSD, suicidal ideation, previous suicide attempts, and past psychiatric hospitalizations who presented voluntarily to [facility] and admitted to [facility] for suicidal ideation.   Patient was assessed in the milieu in no acute distress. On interview, patient presented alert and oriented to person, place, time, and situation. She reported her mood as "good" and stated that she has been doing better over the last 24 hours. She stated that she has spent her time participating in group sessions and activities (such as gardening) and has also been reaching out to shelters to plan for her future. She reported sleeping, eating, and hydrating well with normal bowel movements. She rated her anxiety and depression as both 0/10 (with 10 being the most severe). She stated that her energy levels were at baseline with no new pains. She denied any SI, HI, or AVH, and was not responding to internal stimuli during our conversation. She denied any symptoms of substance cravings or withdrawal. She stated that she feels more calm since starting her lithium  and that her recent dose increase has been beneficial. She also denied having any nightmares or any medication side effects. She reported that she has reached out to all the shelters that the social worker has provided, but has been denied from all of them. She reported wanting to reach out to [employer] for emergency housing vouchers so that she could discharge to a safe environment. She stated that her uncertainty upon discharge made her a little apprehensive, but that she was trying to stay positive and hopeful. She reported no other concerns during our conversation.  Diagnosis:  Final diagnoses:  Homelessness unspecified  Passive suicidal ideations  Ineffective coping  Recurrent  major depressive disorder, remission status unspecified (HCC)    Total Time spent with patient: 20 minutes  Past Psychiatric History:  - Previous Dx: Bipolar I, PTSD, GAD, MDD, ADD, and OCD - Current Psychiatric Medications: Abilify  IM 400 mg q 28 days, Hydroxyzine , Minipress , Lithium , Trazodone , and Fluphenazine  - patient reported not taking any aside from Abilify  IM; patient reported being prescribed Abilify  pill recently but stated she had not picked it up (chart review shows that patient has taken oral Abilify  in Feb/March 2025.) - Past Psychiatric Medications: and Depakote - Previous Psychiatrist/Therapist: Followed for many years by Dr. Dianne Fortune at [facility]; currently looking for new outpatient psychiatrist because of insurance. - Previous Hospitalizations: Extensive history of previous psychiatric hospitalizations - Previous Self-Harm/SI/Suicide Attempts: Endorses multiple instances of self harm and suicide attempts including multiple overdose attempts as a teenager and in 2014 and attempts to cut her wrists   Past Medical History:  - Previous Dx: Denied - Current Medications: Denied - Allergies: Denied any food allergies. Medication allergies include: Doxycycline , celecoxib , valproic acid, haloperidol , methylprednisolone , gabapentin , prednisone , risperidone and related, tramadol  - Hospitalizations: Denied - Surgeries: Denied   Family History:  - None reported.    Family Psychiatric  History:  - Dx: Mom- Hx of bipolar, schizophrenia, depression, and anxiety; Daughter- Hx of anxiety - Rx: Patient reported being unsure of their medications - Suicide attempts: History of attempts in mom - Homicide: Denied - Substance Use: Father - Alcohol use disorder   Social History:  - Residence: Current lives in [place], [place] but is unhoused; grew up in [place] - Job: Current unemployed, but worked at  [employer] most recently - Education: Current enrolled to finish  Bachelor's Degree - Relationships: Never been married; no current partner - Children: 2 children - 81 yo son and 67 yo daughter - Firearm access: denied - Stockpile of pills: denied   Additional Social History:  - Tobacco: Endorses intermitted use since the age of 37. Current smokes 1 pack per day with last use 2 days ago. - Alcohol: Endorses intermittent use since the age of 76. Period of sobriety during pregnancy. Current restarted occasional drinking in 2019, reported having one normal sized beer socially. Reported last drink of one beer on Sunday.  - Marijuana: Endorses intermitted use since 2019. Reported trying a vape pen "a few days ago." - Stimulant: Endorses recent methamphetamine and cocaine/crack use. Meth: Reports only every trying twice while part of recent trap house experience. Unsure of how much was used, but was smoked. Patient also reported being injected with an unknown drug by other residents in the house, assumed to be methamphetamines. Cocaine/Crack: Reports significant use starting 1 year ago. Most recently has been smoking an unknown quantity daily for the past two months. - Opioid: Denies any current or past history of opioid use, but does endorse past hospitalization where Narcan  was used on her. Patient reported that her other drugs may have been laced with fentanyl . - Benzo: Denied any current benzo use but endorsed past history of benzo use. Patient stated that last use was months to years ago.  Additional Social History:   Sleep: Good  Appetite:  Good  Current Medications:  Current Facility-Administered Medications  Medication Dose Route Frequency Provider Last Rate Last Admin   acetaminophen  (TYLENOL ) tablet 650 mg  650 mg Oral Q6H PRN Dorthea Gauze, NP   650 mg at 09/29/23 2125   alum & mag hydroxide-simeth (MAALOX/MYLANTA) 200-200-20 MG/5ML suspension 30 mL  30 mL Oral Q4H PRN Dorthea Gauze, NP   30 mL at 09/30/23 1606   fluticasone  (FLONASE ) 50 MCG/ACT nasal  spray 1 spray  1 spray Each Nare Daily Elma Shands B, MD   1 spray at 10/04/23 0941   hydrocortisone  cream 1 %   Topical BID PRN Marilou Showman, MD   Given at 10/01/23 1507   hydrOXYzine  (ATARAX ) tablet 25 mg  25 mg Oral TID PRN Parmar, Meenakshi, MD   25 mg at 10/02/23 2115   lithium  carbonate capsule 450 mg  450 mg Oral BID WC Nagi Furio B, MD   450 mg at 10/04/23 1610   loratadine  (CLARITIN ) tablet 10 mg  10 mg Oral Daily Ajibola, Ene A, NP   10 mg at 10/04/23 0941   magnesium  hydroxide (MILK OF MAGNESIA) suspension 30 mL  30 mL Oral Daily PRN Dorthea Gauze, NP       nicotine  (NICODERM CQ  - dosed in mg/24 hours) patch 21 mg  21 mg Transdermal Daily Silas Drivers, MD   21 mg at 10/04/23 0941   OLANZapine  (ZYPREXA ) injection 10 mg  10 mg Intramuscular TID PRN Dorthea Gauze, NP       OLANZapine  (ZYPREXA ) injection 5 mg  5 mg Intramuscular TID PRN Dorthea Gauze, NP       OLANZapine  zydis (ZYPREXA ) disintegrating tablet 5 mg  5 mg Oral TID PRN Dorthea Gauze, NP       prazosin  (MINIPRESS ) capsule 1 mg  1 mg Oral QHS Parmar, Meenakshi, MD   1 mg at 10/03/23 2233   traZODone  (DESYREL ) tablet 50 mg  50 mg Oral QHS PRN Parmar, Meenakshi,  MD   50 mg at 10/02/23 2114   Current Outpatient Medications  Medication Sig Dispense Refill   albuterol  (VENTOLIN  HFA) 108 (90 Base) MCG/ACT inhaler Inhale 2 puffs into the lungs every 6 (six) hours as needed for wheezing or shortness of breath. 18 g 2   ARIPiprazole  (ABILIFY ) 20 MG tablet Take 20 mg by mouth daily.     cetirizine  (ZYRTEC  ALLERGY) 10 MG tablet Take 1 tablet (10 mg total) by mouth daily. 30 tablet 2   fluticasone  (FLONASE ) 50 MCG/ACT nasal spray Place 2 sprays into both nostrils daily for 15 days. 16 g 0   hydrOXYzine  (ATARAX ) 25 MG tablet Take 25 mg by mouth 3 (three) times daily as needed for anxiety.     prazosin  (MINIPRESS ) 1 MG capsule Take 1 capsule (1 mg total) by mouth at bedtime. 30 capsule 0   fluPHENAZine  (PROLIXIN ) 10 MG tablet  Take 1 tablet (10 mg total) by mouth at bedtime. (Patient not taking: Reported on 09/28/2023) 30 tablet 0   traZODone  (DESYREL ) 50 MG tablet Take 1 tablet (50 mg total) by mouth at bedtime. (Patient not taking: Reported on 09/28/2023) 30 tablet 0    Labs  Lab Results:  Admission on 09/28/2023  Component Date Value Ref Range Status   SARS Coronavirus 2 by RT PCR 09/28/2023 NEGATIVE  NEGATIVE Final   Performed at [facility], [address], [place], [place] [address]   WBC 09/28/2023 8.6  4.0 - 10.5 K/uL Final   RBC 09/28/2023 4.52  3.87 - 5.11 MIL/uL Final   Hemoglobin 09/28/2023 12.6  12.0 - 15.0 g/dL Final   HCT 40/34/7425 39.3  36.0 - 46.0 % Final   MCV 09/28/2023 86.9  80.0 - 100.0 fL Final   MCH 09/28/2023 27.9  26.0 - 34.0 pg Final   MCHC 09/28/2023 32.1  30.0 - 36.0 g/dL Final   RDW 95/63/8756 13.2  11.5 - 15.5 % Final   Platelets 09/28/2023 268  150 - 400 K/uL Final   nRBC 09/28/2023 0.0  0.0 - 0.2 % Final   Neutrophils Relative % 09/28/2023 60  % Final   Neutro Abs 09/28/2023 5.2  1.7 - 7.7 K/uL Final   Lymphocytes Relative 09/28/2023 30  % Final   Lymphs Abs 09/28/2023 2.6  0.7 - 4.0 K/uL Final   Monocytes Relative 09/28/2023 8  % Final   Monocytes Absolute 09/28/2023 0.7  0.1 - 1.0 K/uL Final   Eosinophils Relative 09/28/2023 1  % Final   Eosinophils Absolute 09/28/2023 0.1  0.0 - 0.5 K/uL Final   Basophils Relative 09/28/2023 1  % Final   Basophils Absolute 09/28/2023 0.0  0.0 - 0.1 K/uL Final   Immature Granulocytes 09/28/2023 0  % Final   Abs Immature Granulocytes 09/28/2023 0.03  0.00 - 0.07 K/uL Final   Performed at [facility], [address], [place], [place] [address]   Sodium 09/28/2023 140  135 - 145 mmol/L Final   Potassium 09/28/2023 3.6  3.5 - 5.1 mmol/L Final   Chloride 09/28/2023 106  98 - 111 mmol/L Final   CO2 09/28/2023 26  22 - 32 mmol/L Final   Glucose, Bld 09/28/2023 101 (H)  70 - 99 mg/dL Final   Glucose reference range applies only  to samples taken after fasting for at least 8 hours.   BUN 09/28/2023 6  6 - 20 mg/dL Final   Creatinine, Ser 09/28/2023 0.63  0.44 - 1.00 mg/dL Final   Calcium   09/28/2023 9.3  8.9 - 10.3 mg/dL Final   Total Protein 04/02/2535 6.4 (L)  6.5 - 8.1 g/dL Final   Albumin 64/40/3474 3.6  3.5 - 5.0 g/dL Final   AST 25/95/6387 21  15 - 41 U/L Final   ALT 09/28/2023 27  0 - 44 U/L Final   Alkaline Phosphatase 09/28/2023 74  38 - 126 U/L Final   Total Bilirubin 09/28/2023 0.9  0.0 - 1.2 mg/dL Final   GFR, Estimated 09/28/2023 >60  >60 mL/min Final   Comment: (NOTE) Calculated using the CKD-EPI Creatinine Equation (2021)    Anion gap 09/28/2023 8  5 - 15 Final   Performed at [facility], [address] [place], [place] [address]   Alcohol, Ethyl (B) 09/28/2023 <15  <15 mg/dL Final   Comment: Please note change in reference range. (NOTE) For medical purposes only. Performed at [facility], [address], [place], [place] [address]    TSH 09/28/2023 2.425  0.350 - 4.500 uIU/mL Final   Comment: Performed by a 3rd Generation assay with a functional sensitivity of <=0.01 uIU/mL. Performed at [facility], [address] [place], [place] [address]    Preg Test, Ur 09/28/2023 Negative  Negative Final   POC Amphetamine UR 09/28/2023 None Detected  NONE DETECTED (Cut Off Level 1000 ng/mL) Final   POC Secobarbital (BAR) 09/28/2023 None Detected  NONE DETECTED (Cut Off Level 300 ng/mL) Final   POC Buprenorphine (BUP) 09/28/2023 None Detected  NONE DETECTED (Cut Off Level 10 ng/mL) Final   POC Oxazepam (BZO) 09/28/2023 None Detected  NONE DETECTED (Cut Off Level 300 ng/mL) Final   POC Cocaine UR 09/28/2023 Positive (A)  NONE DETECTED (Cut Off Level 300 ng/mL) Final   POC Methamphetamine UR 09/28/2023 None Detected  NONE DETECTED (Cut Off Level 1000 ng/mL) Final   POC Morphine 09/28/2023 None Detected  NONE DETECTED (Cut Off Level 300 ng/mL) Final   POC Methadone UR 09/28/2023 None  Detected  NONE DETECTED (Cut Off Level 300 ng/mL) Final   POC Oxycodone  UR 09/28/2023 None Detected  NONE DETECTED (Cut Off Level 100 ng/mL) Final   POC Marijuana UR 09/28/2023 Positive (A)  NONE DETECTED (Cut Off Level 50 ng/mL) Final   Preg Test, Ur 09/28/2023 NEGATIVE  NEGATIVE Final   Comment:        THE SENSITIVITY OF THIS METHODOLOGY IS >24 mIU/mL    Lithium  Lvl 10/02/2023 0.33 (L)  0.60 - 1.20 mmol/L Final   Performed at [facility], [address] [place], [place] [address]  Admission on 09/25/2023, Discharged on 09/25/2023  Component Date Value Ref Range Status   Preg Test, Ur 09/25/2023 Negative  Negative Final   Neisseria Gonorrhea 09/25/2023 Negative   Final   Chlamydia 09/25/2023 Positive (A)   Final   Trichomonas 09/25/2023 Negative   Final   Bacterial Vaginitis (gardnerella) 09/25/2023 Positive (A)   Final   Candida Vaginitis 09/25/2023 Negative   Final   Candida Glabrata 09/25/2023 Negative   Final   Comment 09/25/2023 Normal Reference Range Bacterial Vaginosis - Negative   Final   Comment 09/25/2023 Normal Reference Range Candida Species - Negative   Final   Comment 09/25/2023 Normal Reference Range Candida Galbrata - Negative   Final   Comment 09/25/2023 Normal Reference Range Trichomonas - Negative   Final   Comment 09/25/2023 Normal Reference Ranger Chlamydia - Negative   Final   Comment 09/25/2023 Normal Reference Range Neisseria Gonorrhea -  Negative   Final   HIV Screen 4th Generation wRfx 09/25/2023 Non Reactive  Non Reactive Final   Performed at [facility], [address], [place], [place] [address]   RPR Ser Ql 09/25/2023 NON REACTIVE  NON REACTIVE Final   Performed at [facility], [address] [place], [place] [address]  Admission on 07/29/2023, Discharged on 08/08/2023  Component Date Value Ref Range Status   Cholesterol 08/01/2023 184  0 - 200 mg/dL Final   Triglycerides 82/95/6213 153 (H)  <150 mg/dL Final   HDL 08/65/7846 38 (L)   >40 mg/dL Final   Total CHOL/HDL Ratio 08/01/2023 4.8  RATIO Final   VLDL 08/01/2023 31  0 - 40 mg/dL Final   LDL Cholesterol 08/01/2023 115 (H)  0 - 99 mg/dL Final   Comment:        Total Cholesterol/HDL:CHD Risk Coronary Heart Disease Risk Table                     Men   Women  1/2 Average Risk   3.4   3.3  Average Risk       5.0   4.4  2 X Average Risk   9.6   7.1  3 X Average Risk  23.4   11.0        Use the calculated Patient Ratio above and the CHD Risk Table to determine the patient's CHD Risk.        ATP III CLASSIFICATION (LDL):  <100     mg/dL   Optimal  962-952  mg/dL   Near or Above                    Optimal  130-159  mg/dL   Borderline  841-324  mg/dL   High  >401     mg/dL   Very High Performed at [facility], [address] [place], [place] [address]    Lithium  Lvl 08/05/2023 0.65  0.60 - 1.20 mmol/L Final   Performed at [facility], [address] [place], [place] [address]  Admission on 07/29/2023, Discharged on 07/29/2023  Component Date Value Ref Range Status   Sodium 07/29/2023 137  135 - 145 mmol/L Final   Potassium 07/29/2023 3.5  3.5 - 5.1 mmol/L Final   Chloride 07/29/2023 103  98 - 111 mmol/L Final   CO2 07/29/2023 21 (L)  22 - 32 mmol/L Final   Glucose, Bld 07/29/2023 112 (H)  70 - 99 mg/dL Final   Glucose reference range applies only to samples taken after fasting for at least 8 hours.   BUN 07/29/2023 11  6 - 20 mg/dL Final   Creatinine, Ser 07/29/2023 0.57  0.44 - 1.00 mg/dL Final   Calcium  07/29/2023 8.7 (L)  8.9 - 10.3 mg/dL Final   Total Protein 03/47/4259 6.8  6.5 - 8.1 g/dL Final   Albumin 56/38/7564 3.8  3.5 - 5.0 g/dL Final   AST 33/29/5188 41  15 - 41 U/L Final   ALT 07/29/2023 54 (H)  0 - 44 U/L Final   Alkaline Phosphatase 07/29/2023 76  38 - 126 U/L Final   Total Bilirubin 07/29/2023 0.8  0.0 - 1.2 mg/dL Final   GFR, Estimated 07/29/2023 >60  >60 mL/min Final   Comment: (NOTE) Calculated using the CKD-EPI  Creatinine Equation (2021)    Anion gap 07/29/2023 13  5 - 15 Final   Performed at [facility], [address] [place], [place] [address]   Alcohol, Ethyl (B) 07/29/2023 <10  <10 mg/dL Final   Comment: (NOTE) Lowest detectable limit for serum alcohol is 10 mg/dL.  For medical purposes only. Performed at [facility], [address] [place], [place] [address]    Salicylate Lvl 07/29/2023 <7.0 (L)  7.0 - 30.0 mg/dL Final   Performed at [facility], [address] [place], [place] [address]   Acetaminophen  (Tylenol ), Serum 07/29/2023 <10 (L)  10 - 30 ug/mL Final   Comment: (NOTE) Therapeutic concentrations vary significantly. A range of 10-30 ug/mL  may be an effective concentration for many patients. However, some  are best treated at concentrations outside of this range. Acetaminophen  concentrations >150 ug/mL at 4 hours after ingestion  and >50 ug/mL at 12 hours after ingestion are often associated with  toxic reactions.  Performed at [facility], [address] [place], [place] [address]    WBC 07/29/2023 6.7  4.0 - 10.5 K/uL Final   RBC 07/29/2023 4.15  3.87 - 5.11 MIL/uL Final   Hemoglobin 07/29/2023 11.8 (L)  12.0 - 15.0 g/dL Final   HCT 29/56/2130 36.5  36.0 - 46.0 % Final   MCV 07/29/2023 88.0  80.0 - 100.0 fL Final   MCH 07/29/2023 28.4  26.0 - 34.0 pg Final   MCHC 07/29/2023 32.3  30.0 - 36.0 g/dL Final   RDW 86/57/8469 13.6  11.5 - 15.5 % Final   Platelets 07/29/2023 327  150 - 400 K/uL Final   nRBC 07/29/2023 0.0  0.0 - 0.2 % Final   Performed at [facility], [address] [place], [place] [address]   Tricyclic, Ur Screen 07/29/2023 POSITIVE (A)  NONE DETECTED Final   Amphetamines, Ur Screen 07/29/2023 NONE DETECTED  NONE DETECTED Final   MDMA (Ecstasy)Ur Screen 07/29/2023 NONE DETECTED  NONE DETECTED Final   Cocaine Metabolite,Ur [place] 07/29/2023 NONE DETECTED  NONE DETECTED Final   Opiate, Ur Screen  07/29/2023 NONE DETECTED  NONE DETECTED Final   Phencyclidine (PCP) Ur S 07/29/2023 NONE DETECTED  NONE DETECTED Final   Cannabinoid 50 Ng, Ur [place] 07/29/2023 POSITIVE (A)  NONE DETECTED Final   Barbiturates, Ur Screen 07/29/2023 NONE DETECTED  NONE DETECTED Final   Benzodiazepine, Ur Scrn 07/29/2023 POSITIVE (A)  NONE DETECTED Final   Methadone Scn, Ur 07/29/2023 NONE DETECTED  NONE DETECTED Final   Comment: (NOTE) Tricyclics + metabolites, urine    Cutoff 1000 ng/mL Amphetamines + metabolites, urine  Cutoff 1000 ng/mL MDMA (Ecstasy), urine              Cutoff 500 ng/mL Cocaine Metabolite, urine          Cutoff 300 ng/mL Opiate + metabolites, urine        Cutoff 300 ng/mL Phencyclidine (PCP), urine         Cutoff 25 ng/mL Cannabinoid, urine                 Cutoff 50 ng/mL Barbiturates + metabolites, urine  Cutoff 200 ng/mL Benzodiazepine, urine              Cutoff 200 ng/mL Methadone, urine                   Cutoff 300 ng/mL  The urine drug screen provides only a preliminary, unconfirmed analytical test result and should not be used for non-medical purposes. Clinical consideration and professional judgment should be applied to any positive drug screen result  due to possible interfering substances. A more specific alternate chemical method must be used in order to obtain a confirmed analytical result. Gas chromatography / mass spectrometry (GC/MS) is the preferred confirm                          atory method. Performed at [facility], [address] [place], [place] [address]    Lithium  Lvl 07/29/2023 <0.06 (L)  0.60 - 1.20 mmol/L Final   Performed at [facility], [address] [place], [place] [address]  Admission on 07/19/2023, Discharged on 07/19/2023  Component Date Value Ref Range Status   WBC 07/19/2023 14.7 (H)  4.0 - 10.5 K/uL Final   RBC 07/19/2023 4.76  3.87 - 5.11 MIL/uL Final   Hemoglobin 07/19/2023 13.6  12.0 - 15.0 g/dL Final   HCT 47/82/9562  43.1  36.0 - 46.0 % Final   MCV 07/19/2023 90.5  80.0 - 100.0 fL Final   MCH 07/19/2023 28.6  26.0 - 34.0 pg Final   MCHC 07/19/2023 31.6  30.0 - 36.0 g/dL Final   RDW 13/01/6577 13.6  11.5 - 15.5 % Final   Platelets 07/19/2023 378  150 - 400 K/uL Final   nRBC 07/19/2023 0.0  0.0 - 0.2 % Final   Neutrophils Relative % 07/19/2023 80  % Final   Neutro Abs 07/19/2023 11.7 (H)  1.7 - 7.7 K/uL Final   Lymphocytes Relative 07/19/2023 13  % Final   Lymphs Abs 07/19/2023 2.0  0.7 - 4.0 K/uL Final   Monocytes Relative 07/19/2023 6  % Final   Monocytes Absolute 07/19/2023 0.8  0.1 - 1.0 K/uL Final   Eosinophils Relative 07/19/2023 0  % Final   Eosinophils Absolute 07/19/2023 0.0  0.0 - 0.5 K/uL Final   Basophils Relative 07/19/2023 0  % Final   Basophils Absolute 07/19/2023 0.0  0.0 - 0.1 K/uL Final   Immature Granulocytes 07/19/2023 1  % Final   Abs Immature Granulocytes 07/19/2023 0.07  0.00 - 0.07 K/uL Final   Performed at [facility], [address] [place], [place] [address]   Sodium 07/19/2023 142  135 - 145 mmol/L Final   Potassium 07/19/2023 4.6  3.5 - 5.1 mmol/L Final   Chloride 07/19/2023 107  98 - 111 mmol/L Final   CO2 07/19/2023 15 (L)  22 - 32 mmol/L Final   Glucose, Bld 07/19/2023 113 (H)  70 - 99 mg/dL Final   Glucose reference range applies only to samples taken after fasting for at least 8 hours.   BUN 07/19/2023 8  6 - 20 mg/dL Final   Creatinine, Ser 07/19/2023 0.71  0.44 - 1.00 mg/dL Final   Calcium  07/19/2023 9.5  8.9 - 10.3 mg/dL Final   Total Protein 95/28/4132 7.8  6.5 - 8.1 g/dL Final   Albumin 44/06/270 4.2  3.5 - 5.0 g/dL Final   AST 53/66/4403 44 (H)  15 - 41 U/L Final   ALT 07/19/2023 49 (H)  0 - 44 U/L Final   Alkaline Phosphatase 07/19/2023 82  38 - 126 U/L Final   Total Bilirubin 07/19/2023 0.8  0.0 - 1.2 mg/dL Final   GFR, Estimated 07/19/2023 >60  >60 mL/min Final   Comment: (NOTE) Calculated using the CKD-EPI Creatinine Equation (2021)     Anion gap 07/19/2023 20 (H)  5 - 15 Final   Performed at [facility], [address], [place], [place] [address]   Troponin  I (High Sensitivity) 07/19/2023 6  <18 ng/L Final   Comment: (NOTE) Elevated high sensitivity troponin I (hsTnI) values and significant  changes across serial measurements may suggest ACS but many other  chronic and acute conditions are known to elevate hsTnI results.  Refer to the "Links" section for chest pain algorithms and additional  guidance. Performed at [facility], [address] [place], [place] [address]    Color, Urine 07/19/2023 YELLOW (A)  YELLOW Final   APPearance 07/19/2023 CLEAR (A)  CLEAR Final   Specific Gravity, Urine 07/19/2023 1.008  1.005 - 1.030 Final   pH 07/19/2023 6.0  5.0 - 8.0 Final   Glucose, UA 07/19/2023 NEGATIVE  NEGATIVE mg/dL Final   Hgb urine dipstick 07/19/2023 NEGATIVE  NEGATIVE Final   Bilirubin Urine 07/19/2023 NEGATIVE  NEGATIVE Final   Ketones, ur 07/19/2023 5 (A)  NEGATIVE mg/dL Final   Protein, ur 21/30/8657 NEGATIVE  NEGATIVE mg/dL Final   Nitrite 84/69/6295 NEGATIVE  NEGATIVE Final   Leukocytes,Ua 07/19/2023 NEGATIVE  NEGATIVE Final   Performed at [facility], [address] [place], [place] [address]   Tricyclic, Ur Screen 07/19/2023 NONE DETECTED  NONE DETECTED Final   Amphetamines, Ur Screen 07/19/2023 NONE DETECTED  NONE DETECTED Final   MDMA (Ecstasy)Ur Screen 07/19/2023 NONE DETECTED  NONE DETECTED Final   Cocaine Metabolite,Ur [place] 07/19/2023 NONE DETECTED  NONE DETECTED Final   Opiate, Ur Screen 07/19/2023 POSITIVE (A)  NONE DETECTED Final   Phencyclidine (PCP) Ur S 07/19/2023 NONE DETECTED  NONE DETECTED Final   Cannabinoid 50 Ng, Ur [place] 07/19/2023 POSITIVE (A)  NONE DETECTED Final   Barbiturates, Ur Screen 07/19/2023 NONE DETECTED  NONE DETECTED Final   Benzodiazepine, Ur Scrn 07/19/2023 NONE DETECTED  NONE DETECTED Final   Methadone Scn, Ur 07/19/2023 NONE DETECTED  NONE  DETECTED Final   Comment: (NOTE) Tricyclics + metabolites, urine    Cutoff 1000 ng/mL Amphetamines + metabolites, urine  Cutoff 1000 ng/mL MDMA (Ecstasy), urine              Cutoff 500 ng/mL Cocaine Metabolite, urine          Cutoff 300 ng/mL Opiate + metabolites, urine        Cutoff 300 ng/mL Phencyclidine (PCP), urine         Cutoff 25 ng/mL Cannabinoid, urine                 Cutoff 50 ng/mL Barbiturates + metabolites, urine  Cutoff 200 ng/mL Benzodiazepine, urine              Cutoff 200 ng/mL Methadone, urine                   Cutoff 300 ng/mL  The urine drug screen provides only a preliminary, unconfirmed analytical test result and should not be used for non-medical purposes. Clinical consideration and professional judgment should be applied to any positive drug screen result due to possible interfering substances. A more specific alternate chemical method must be used in order to obtain a confirmed analytical result. Gas chromatography / mass spectrometry (GC/MS) is the preferred confirm                          atory method. Performed at [facility], [address] [place], [place] [address]    Glucose-Capillary 07/19/2023 110 (H)  70 - 99 mg/dL Final   Glucose reference range applies only to samples taken after  fasting for at least 8 hours.   Specimen Description 07/19/2023 BLOOD LEFT ASSIST CONTROL   Final   Special Requests 07/19/2023 BOTTLES DRAWN AEROBIC AND ANAEROBIC Blood Culture adequate volume   Final   Culture 07/19/2023    Final                   Value:NO GROWTH 5 DAYS Performed at [facility], [address], [place], [place] [address]    Report Status 07/19/2023 07/24/2023 FINAL   Final   Specimen Description 07/19/2023 BLOOD BLOOD RIGHT HAND   Final   Special Requests 07/19/2023 AEROBIC BOTTLE ONLY Blood Culture results may not be optimal due to an inadequate volume of blood received in culture bottles   Final   Culture 07/19/2023     Final                   Value:NO GROWTH 5 DAYS Performed at [facility], [address] [place], [place] [address]    Report Status 07/19/2023 07/24/2023 FINAL   Final   Lactic Acid, Venous 07/19/2023 6.3 (HH)  0.5 - 1.9 mmol/L Final   Comment: CRITICAL RESULT CALLED TO, READ BACK BY AND VERIFIED WITH  ALEX [facility] AT 0250 07/19/23 JG Performed at [facility], [address] [place], [place] [address]    Lactic Acid, Venous 07/19/2023 1.3  0.5 - 1.9 mmol/L Final   Performed at [facility], [address] [place], [place] [address]   Total CK 07/19/2023 822 (H)  38 - 234 U/L Final   Performed at [facility], [address] [place], [place] [address]   Sodium 07/19/2023 138  135 - 145 mmol/L Final   Potassium 07/19/2023 4.5  3.5 - 5.1 mmol/L Final   Chloride 07/19/2023 106  98 - 111 mmol/L Final   CO2 07/19/2023 23  22 - 32 mmol/L Final   Glucose, Bld 07/19/2023 93  70 - 99 mg/dL Final   Glucose reference range applies only to samples taken after fasting for at least 8 hours.   BUN 07/19/2023 9  6 - 20 mg/dL Final   Creatinine, Ser 07/19/2023 0.59  0.44 - 1.00 mg/dL Final   Calcium  07/19/2023 8.4 (L)  8.9 - 10.3 mg/dL Final   GFR, Estimated 07/19/2023 >60  >60 mL/min Final   Comment: (NOTE) Calculated using the CKD-EPI Creatinine Equation (2021)    Anion gap 07/19/2023 9  5 - 15 Final   Performed at [facility], [address] [place], [place] [address]   WBC 07/19/2023 11.3 (H)  4.0 - 10.5 K/uL Final   RBC 07/19/2023 4.04  3.87 - 5.11 MIL/uL Final   Hemoglobin 07/19/2023 11.4 (L)  12.0 - 15.0 g/dL Final   HCT 57/84/6962 35.0 (L)  36.0 - 46.0 % Final   MCV 07/19/2023 86.6  80.0 - 100.0 fL Final   MCH 07/19/2023 28.2  26.0 - 34.0 pg Final   MCHC 07/19/2023 32.6  30.0 - 36.0 g/dL Final   RDW 95/28/4132 13.5  11.5 - 15.5 % Final   Platelets 07/19/2023 279  150 - 400 K/uL Final   nRBC 07/19/2023 0.0  0.0 - 0.2 % Final    Performed at [facility], [address] [place], [place] [address]   Troponin I (High Sensitivity) 07/19/2023 15  <18 ng/L Final   Comment: (NOTE) Elevated high sensitivity troponin I (hsTnI) values and significant  changes across serial measurements may suggest ACS  but many other  chronic and acute conditions are known to elevate hsTnI results.  Refer to the "Links" section for chest pain algorithms and additional  guidance. Performed at [facility], [address] [place], [place] [address]    Lactic Acid, Venous 07/19/2023 0.9  0.5 - 1.9 mmol/L Final   Performed at [facility] [address] [place], [place] [address]    Blood Alcohol level:  Lab Results  Component Value Date   [facility] <15 09/28/2023   ETH <10 07/29/2023    Metabolic Disorder Labs: Lab Results  Component Value Date   HGBA1C 5.5 07/11/2023   MPG 111.15 07/11/2023   MPG 111.15 02/04/2021   Lab Results  Component Value Date   PROLACTIN 39.5 (H) 04/12/2018   PROLACTIN 2.0 12/05/2011   Lab Results  Component Value Date   CHOL 184 08/01/2023   TRIG 153 (H) 08/01/2023   HDL 38 (L) 08/01/2023   CHOLHDL 4.8 08/01/2023   VLDL 31 08/01/2023   LDLCALC 115 (H) 08/01/2023   LDLCALC 109 (H) 09/05/2022    Therapeutic Lab Levels: Lab Results  Component Value Date   LITHIUM  0.33 (L) 10/02/2023   LITHIUM  0.65 08/05/2023   No results found for: "VALPROATE" No results found for: "CBMZ"  Physical Findings   AIMS    Flowsheet Row [facility]) [facility]) [facility] Ad[facility] (Discharged) from 05/14/2020 in [facility]PAT[facility]ICINE Admission (Discharged) from OP Visit from 08[facility]08[facility] Admission (Discharged) from 10/16/2018 in [facility]PATIENT BE[facility]AL MEDICINE  AIMS Total Score 0 0 0 0 0      AUDIT     Flowsheet Row Admission (Discharged) from 07/29/2023 in [facility]PAT[facility] BE[facility]ICINE Admission (Discharged) [facility] (Discharged) [facility] Ad[facility] (Discharged) from 05/14/2020 in [facility] BE[facility]INE Admission (Discharged) from 09/25/2019 in [facility] INPATIENT BEHAVIORAL MEDICINE  Alcohol Use Disorder Identification Test Final Score (AUDIT) 0 4 1 3 4       PHQ2-9    Flowsheet Row ED from 09/28/2023 in [facility] Office Visit from 08/21/2023 in [facility] [place]. [facility] Office Visit from 07/07/2023 in [facility] - [facility] Dept Of [place]. [facility] Office Visit from 06/26/2023 in [facility] - [facility] Dept Of [place]. [facility] Office Visit from 06/22/2023 in [facility] - [facility] Dept Of [place]. [facility]  PHQ-2 Total Score 2 0 0 0 0  PHQ-9 Total Score 9 0 0 0 0      Flowsheet Row ED from 09/28/2023 in [facility] Most recent reading at 09/28/2023  6:47 AM ED from 09/28/2023 in [facility] Most recent reading at 09/28/2023  6:32 AM ED from 09/25/2023 in [facility] [place] Most recent reading at 09/25/2023  3:12 PM  C-SSRS RISK CATEGORY Moderate Risk Moderate Risk No Risk        Musculoskeletal  Strength & Muscle Tone: within normal limits Gait & Station: normal Patient leans: N/A  Psychiatric Specialty Exam  Presentation  General Appearance:  Appropriate for Environment; Well Groomed  Eye Contact: Good  Speech: Clear and Coherent; Normal Rate  Speech  Volume: Normal  Handedness: Right   Mood and Affect  Mood: Euthymic ("Good")  Affect: Congruent; Appropriate   Thought Process  Thought Processes: Coherent; Goal Directed;  Linear  Descriptions of Associations:Intact  Orientation:Full (Time, Place and Person)  Thought Content:Logical  Diagnosis of Schizophrenia or Schizoaffective disorder in past: No   Hallucinations:Hallucinations: None  Ideas of Reference:None  Suicidal Thoughts:Suicidal Thoughts: No  Homicidal Thoughts:Homicidal Thoughts: No   Sensorium  Memory: Immediate Fair; Recent Fair; Remote Fair  Judgment: Fair  Insight: Fair   Art therapist  Concentration: Fair  Attention Span: Fair  Recall: [employer] of Knowledge: Fair  Language: Fair   Psychomotor Activity  Psychomotor Activity: Psychomotor Activity: Normal   Assets  Assets: Communication Skills; Kelly for Improvement; Resilience   Sleep  Sleep: Sleep: Good   Physical Exam  Physical Exam Eyes:     Conjunctiva/sclera: Conjunctivae normal.  Pulmonary:     Effort: Pulmonary effort is normal.  Musculoskeletal:        General: Normal range of motion.     Cervical back: Normal range of motion.  Neurological:     Mental Status: She is alert and oriented to person, place, and time.  Psychiatric:        Mood and Affect: Mood normal.        Thought Content: Thought content normal.    Review of Systems  Constitutional:  Negative for chills, diaphoresis and fever.  HENT:  Negative for hearing loss, sore throat and tinnitus.   Eyes:  Negative for blurred vision and double vision.  Respiratory:  Negative for cough, shortness of breath and wheezing.   Cardiovascular:  Negative for chest pain and palpitations.  Gastrointestinal:  Negative for abdominal pain, constipation, diarrhea, nausea and vomiting.  Genitourinary:  Negative for dysuria, frequency and urgency.  Musculoskeletal:  Negative for myalgias.  Neurological:  Negative for dizziness and headaches.  Psychiatric/Behavioral:  Negative for depression, hallucinations and suicidal ideas. The patient is not nervous/anxious and does not have  insomnia.    Blood pressure 122/78, pulse 81, temperature 97.6 F (36.4 C), temperature source Oral, resp. rate 18, SpO2 99%. There is no height or weight on file to calculate BMI.  Assessment: Orlene Delk, is 53 y.o. female with a history of bipolar I disorder, PTSD, suicidal ideation, previous suicide attempts, and past psychiatric hospitalizations who presented voluntarily to [facility] and admitted to [facility] for suicidal ideation.    On assessment, patient continues to require inpatient hospitalization for disposition planning. Compared to her state upon admission, patient presents with better control of her mood and anxiety symptoms. She has felt beneficial effects from restarting her lithium  and prazosin  and feels like she is in a stable state. She looks forward to figuring out her next steps and is anxious about her housing situation upon discharge. She has been actively calling shelters and programs in the area without any success, but is hopeful about receiving emergency housing vouchers from [employer]. We will continue to monitor her condition, her medications, and collaborate with social work to help patient find housing resources for discharge (anticipated tomorrow).   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 - Continue lithium  450 mg twice daily - Lithium  level on 10/02/23: 0.33 -- Continue Hydroxyzine  25 mg tid prn -- Continue Trazodone  50 mg prn   PTSD - Continue prazosin  1 mg nightly   Cocaine abuse -- Encourage cessation  - Residential rehab placement -- IVDU labs: HIV and Hepatitis  panel still pending -- PRN Tylenol , maalox/mylanta, milk of magnesia    Tobacco Use Disorder -NRT ordered, discontinue before bedtime daily -Encourage smoking cessation   Park Bolk, Medical Student  I personally was present and performed or re-performed the history and medical decision-making activities of this service and have verified that  the service and findings are accurately documented in the student's note.  Saratha Cunas, MD, PGY-2

## 2023-10-04 NOTE — ED Notes (Signed)
 Patient alert & oriented x4. Denies intent to harm self or others when asked. Denies A/VH. Patient denies any physical complaints when asked. No acute distress noted. Scheduled medications administered with no complications. Support and encouragement provided. Routine safety checks conducted per facility protocol. Encouraged patient to notify staff if any thoughts of harm towards self or others arise. Patient verbalizes understanding and agreement.

## 2023-10-04 NOTE — ED Notes (Signed)
 Patient sitting in dayroom interacting with peers. No acute distress noted. No concerns voiced. Informed patient to notify staff with any needs or assistance. Patient verbalized understanding or agreement. Safety checks in place per facility policy.

## 2023-10-04 NOTE — ED Notes (Signed)
 Patient is sleeping. Respirations equal and unlabored, skin warm and dry. No change in assessment or acuity. Routine safety checks conducted according to facility protocol. Will continue to monitor for safety.

## 2023-10-04 NOTE — ED Notes (Signed)
 Pt was provided lunch

## 2023-10-04 NOTE — ED Notes (Signed)
 Patient discharged to shelter per MD order. After Visit Summary (AVS) printed and given to patient, as well as printed prescriptions. AVS reviewed with patient and all questions answered. Patient discharged in no acute distress, A& O x4 and ambulatory. Patient denied SI/HI, A/VH upon discharge. Patient verbalized understanding of all discharge instructions explained by staff, including RX's and safety plan. Patient mood fair.  Patient belongings returned to patient from locker #16 complete and intact. Patient escorted to lobby via staff for transport to destination. Safety maintained.

## 2023-10-04 NOTE — Discharge Planning (Signed)
 LCSW spoke with patient several times today and provided her with additional list for shelters in [place], [place], [address] areas. She later stated that she called all of them and was not able to find a place. This LCSW made one call to [facility] in [place] and they stated they did have a female bed but was first come-first serve. Intake worker was made aware this person would be coming from [place] via taxi and needed to ensure she would be able to get a bed when she arrived and she just told me to call right before she came and provide her name and it would be fine. Pt and provider was made aware. Pt was given printouts on the shelter. RN notified and will provide a taxi voucher for transport to the [employer].
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
## 2023-10-12 ENCOUNTER — Ambulatory Visit (INDEPENDENT_AMBULATORY_CARE_PROVIDER_SITE_OTHER): Payer: MEDICAID | Admitting: Family Medicine

## 2023-10-12 ENCOUNTER — Encounter: Payer: Self-pay | Admitting: Family Medicine

## 2023-10-12 VITALS — BP 117/75 | HR 85 | Ht 64.0 in | Wt 155.4 lb

## 2023-10-12 DIAGNOSIS — Z1211 Encounter for screening for malignant neoplasm of colon: Secondary | ICD-10-CM

## 2023-10-12 DIAGNOSIS — Z23 Encounter for immunization: Secondary | ICD-10-CM

## 2023-10-12 DIAGNOSIS — Z0000 Encounter for general adult medical examination without abnormal findings: Secondary | ICD-10-CM

## 2023-10-12 NOTE — Patient Instructions (Addendum)
 It was great to see you today! Thank you for choosing [facility] for your primary care. Kelly Carr was seen for annual physical.  Today we addressed: Up-to-date on screenings except for colonoscopy Vaccines due today Tdap, Prevnar (pneumonia), and shingles.  Tdap and Prevnar will be administered in clinic however you have to go to the pharmacy for shingles. Referred you to GI for a colonoscopy Continue going to your therapy sessions and working with psychiatry for your mental health and managing your Lithium . Kelly Carr doing great!  You should return to our clinic Return in about 1 year (around 10/11/2024). Please arrive 15 minutes before your appointment to ensure smooth check in process.  We appreciate your efforts in making this happen.  Thank you for allowing me to participate in your care, Clyda Dark, DO 10/12/2023, 2:09 PM PGY-1, [facility]

## 2023-10-20 ENCOUNTER — Other Ambulatory Visit (HOSPITAL_COMMUNITY)
Admission: RE | Admit: 2023-10-20 | Discharge: 2023-10-20 | Disposition: A | Discharge status: Home / Self Care | Payer: MEDICAID | Source: Other Acute Inpatient Hospital | Attending: Internal Medicine | Admitting: Internal Medicine

## 2023-10-20 DIAGNOSIS — F319 Bipolar disorder, unspecified: Secondary | ICD-10-CM | POA: Diagnosis present

## 2023-10-20 LAB — CBC WITH DIFFERENTIAL/PLATELET
Abs Immature Granulocytes: 0.05 10*3/uL (ref 0.00–0.07)
Basophils Absolute: 0 10*3/uL (ref 0.0–0.1)
Basophils Relative: 0 %
Eosinophils Absolute: 0.4 10*3/uL (ref 0.0–0.5)
Eosinophils Relative: 3 %
HCT: 37.4 % (ref 36.0–46.0)
Hemoglobin: 12.2 g/dL (ref 12.0–15.0)
Immature Granulocytes: 0 %
Lymphocytes Relative: 27 %
Lymphs Abs: 3.1 10*3/uL (ref 0.7–4.0)
MCH: 28.3 pg (ref 26.0–34.0)
MCHC: 32.6 g/dL (ref 30.0–36.0)
MCV: 86.8 fL (ref 80.0–100.0)
Monocytes Absolute: 0.8 10*3/uL (ref 0.1–1.0)
Monocytes Relative: 7 %
Neutro Abs: 7.3 10*3/uL (ref 1.7–7.7)
Neutrophils Relative %: 63 %
Platelets: 338 10*3/uL (ref 150–400)
RBC: 4.31 MIL/uL (ref 3.87–5.11)
RDW: 13.2 % (ref 11.5–15.5)
WBC: 11.7 10*3/uL — ABNORMAL HIGH (ref 4.0–10.5)
nRBC: 0 % (ref 0.0–0.2)

## 2023-10-20 LAB — LITHIUM LEVEL: Lithium Lvl: 0.61 mmol/L (ref 0.60–1.20)

## 2023-10-20 LAB — TSH: TSH: 3.575 u[IU]/mL (ref 0.350–4.500)

## 2023-10-23 ENCOUNTER — Ambulatory Visit (INDEPENDENT_AMBULATORY_CARE_PROVIDER_SITE_OTHER): Payer: MEDICAID | Admitting: Family Medicine

## 2023-10-23 ENCOUNTER — Ambulatory Visit (HOSPITAL_COMMUNITY): Payer: Self-pay

## 2023-10-23 ENCOUNTER — Encounter: Payer: Self-pay | Admitting: Family Medicine

## 2023-10-23 VITALS — BP 120/78 | HR 92 | Temp 97.9°F | Ht 64.0 in | Wt 158.0 lb

## 2023-10-23 DIAGNOSIS — J029 Acute pharyngitis, unspecified: Secondary | ICD-10-CM

## 2023-10-23 DIAGNOSIS — Z0000 Encounter for general adult medical examination without abnormal findings: Secondary | ICD-10-CM

## 2023-10-23 LAB — POCT RAPID STREP A (OFFICE): Rapid Strep A Screen: NEGATIVE

## 2023-10-23 NOTE — Assessment & Plan Note (Signed)
 Gave number to [facility] to call to schedule colonoscopy as well as further evaluation for hiatal hernia.

## 2023-10-23 NOTE — Patient Instructions (Signed)
 You are negative for strep throat. We are awaiting COVID/flu testing.  In the meantime, you can use OTC meds including Tylenol /ibuprofen  as well as honey and warm tea.   Returning to care should you have persistent fever, chest pain, increasing shortness of breath, or otherwise is not improving.

## 2023-10-23 NOTE — Progress Notes (Signed)
    SUBJECTIVE:   CHIEF COMPLAINT / HPI:   Sick symptoms Woke up this morning with sore throat. Painful to swallow but she can swallow okay. No cough. No fevers. No sick contacts. No runny nose. No SOB. No ear pain. Has a history of allergies, and they have been worse recently. Her allergy medications have not helped. Has not tried anything else for pain. Lives with her daughter who works in a daycare.  PERTINENT  PMH / PSH: asthma, allergies, nasal polyps  OBJECTIVE:   BP 120/78   Pulse 92   Temp 97.9 F (36.6 C) (Oral)   Ht 5\' 4"  (1.626 m)   Wt 158 lb (71.7 kg)   SpO2 100%   BMI 27.12 kg/m   General: Alert and oriented, in NAD Skin: Warm, dry, and intact HEENT: NCAT, EOM grossly normal, midline nasal septum, posterior oropharyngeal erythema, bilateral tonsillar hypertrophy (right greater than left) though without exudates, no discrete cervical nodes appreciated though patient did have tenderness to palpation overlying superior anterior chain Cardiac: RRR, no m/r/g appreciated Respiratory: CTAB, breathing and speaking comfortably on RA Abdominal: Soft, nontender, nondistended, normoactive bowel sounds Extremities: Moves all extremities grossly equally Neurological: No gross focal deficit  ASSESSMENT/PLAN:   Assessment & Plan Pharyngitis, unspecified etiology Most likely viral in nature.  Rapid strep negative, though culture sent.  COVID/flu was a send out lab and is pending.  Reassuringly without respiratory compromise on exam.  Recommended conservative management with continued OTC meds including Tylenol /ibuprofen  as well as honey and warm tea.  Discussed returning to care should she have fever, chest pain, increasing shortness of breath, or otherwise is not improving.  Healthcare maintenance Gave number to [facility] to call to schedule colonoscopy as well as further evaluation for hiatal hernia.   Genetta Kenning, MD [facility]

## 2023-10-25 ENCOUNTER — Ambulatory Visit: Payer: Self-pay | Admitting: Family Medicine

## 2023-10-25 LAB — COVID-19, FLU A+B NAA
Influenza A, NAA: NOT DETECTED
Influenza B, NAA: NOT DETECTED
SARS-CoV-2, NAA: NOT DETECTED

## 2023-10-25 LAB — CULTURE, GROUP A STREP: Strep A Culture: NEGATIVE

## 2023-11-14 ENCOUNTER — Encounter: Payer: Self-pay | Admitting: *Deleted

## 2023-11-27 ENCOUNTER — Encounter: Payer: Self-pay | Admitting: Family Medicine

## 2023-12-22 ENCOUNTER — Ambulatory Visit (INDEPENDENT_AMBULATORY_CARE_PROVIDER_SITE_OTHER): Payer: MEDICAID

## 2023-12-22 VITALS — BP 117/79 | HR 89 | Ht 64.0 in | Wt 157.6 lb

## 2023-12-22 DIAGNOSIS — M7989 Other specified soft tissue disorders: Secondary | ICD-10-CM

## 2023-12-22 NOTE — Progress Notes (Signed)
    SUBJECTIVE:   CHIEF COMPLAINT / HPI:   Bump/nodule on right face  Ms. Ayars is a 53 YO Female present at [facility] concerning for worsening right lower face bump/nodule. Patient states she has been having this small firm bump for over 6 months which her family and herself noticed it become bigger. Denies itchiness, fever, chills, discharge from the nodule, unintended weight loss. Beside the lesion she has been doing well.    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  Cardiovascular:     Rate and Rhythm: Normal rate and regular rhythm.  Pulmonary:     Effort: Pulmonary effort is normal.     Breath sounds: Normal breath sounds.  Abdominal:     Palpations: Abdomen is soft.  Skin:    Findings: Lesion (Right lower face - firm subcentimeter nodule. No discoloration. Firm. Sub centimeter. Even edge) present.     Comments: Left thigh red 1-2 mm in diameter nodule  Neurological:     General: No focal deficit present.     Mental Status: She is alert. Mental status is at baseline.  Psychiatric:        Mood and Affect: Mood normal.        Behavior: Behavior normal.      ASSESSMENT/PLAN:   Ms. Prescott is a 53 YO Female present at [facility] concerning for worsening right lower face bump/nodule. Patient is doing well with out any complaint except worsening - increase size nodule on her right lower face.  Assessment & Plan Nodule of soft tissue - Referral to Dermatology [facility] clinic    Plan discuss with Dr.Neal attending physician who agreed with plan.    Marlin Brys, DO PGY 1 Family Medicine  Resident [facility]

## 2023-12-22 NOTE — Patient Instructions (Addendum)
   It was great to see you!  Our plans for today:  - Referral to Dermatology clinic   Take care and seek immediate care sooner if you develop any concerns.     Houston Coralee HAS PGY 1 Family Medicine Resident [facility]  [address] [place], [place] [address] Fax [phone] Phone [phone] 12/22/2023, 2:45 PM

## 2024-01-11 ENCOUNTER — Ambulatory Visit: Payer: MEDICAID | Admitting: Family Medicine

## 2024-01-11 VITALS — BP 116/74 | HR 95 | Wt 162.4 lb

## 2024-01-11 DIAGNOSIS — L989 Disorder of the skin and subcutaneous tissue, unspecified: Secondary | ICD-10-CM

## 2024-01-11 DIAGNOSIS — J3489 Other specified disorders of nose and nasal sinuses: Secondary | ICD-10-CM

## 2024-01-11 DIAGNOSIS — M7989 Other specified soft tissue disorders: Secondary | ICD-10-CM

## 2024-01-11 DIAGNOSIS — D2372 Other benign neoplasm of skin of left lower limb, including hip: Secondary | ICD-10-CM | POA: Diagnosis not present

## 2024-01-11 MED ORDER — MUPIROCIN CALCIUM 2 % EX CREA: 1.0000 | TOPICAL_CREAM | Freq: Two times a day (BID) | CUTANEOUS | 0 refills | Status: AC

## 2024-01-11 NOTE — Progress Notes (Signed)
    SUBJECTIVE:   CHIEF COMPLAINT / HPI:   Kelly Carr is a 53 y.o. female presenting to [facility] clinic with facial lesion.  Has a nodule on right lower face which has been increasing in size over past 6 months. Unsure if its grown since last visit in 12/2023. Occasional sore and red around; no fluid draining from it.  Also reports two spots of concern on left leg; one at the bottom of her leg which is pink and raised and has been present for months; one higher on her back thigh which is red and has been present for a year and which patient thinks has increased in size some.  Also reports some pain, crusting, in bilateral anterior nares.  PERTINENT  PMH / PSH: Fhx skin cancer in father  OBJECTIVE:   BP 116/74   Pulse 95   Wt 162 lb 6.4 oz (73.7 kg)   SpO2 100%   BMI 27.88 kg/m   HEENT: Minimal crusting of anterior nares. Derm: Pale papule of lower right face, firm, nontender, erythematous surrounding, nonblanchable, questionable dimple/pore, positive hair growth. Pink papule of left lower leg, soft, blanchable, nontender, depressed with pinching. Red, slightly scaly flatter papule of left posterior upper thigh, nontender, blanchable.  ASSESSMENT/PLAN:   Assessment & Plan Nodule of soft tissue Suspect sebaceous cyst versus nevus; very low concern for malignancy. Given this is in a sensitive region (face), and given firmness, did not attempt to express anything from nodule. Will have patient monitor over period of months (picture diary encouraged) to see if this grows smaller. - Referral placed to dermatology for further management Dermatofibroma of calf, left Given appearance with dimpling upon pinching, most likely dermatofibroma. Benign, no further workup/intervention needed. Benign skin lesion of thigh Given appearance with minimal crusting, suspect nevus; low concern for malignancy. No further workup/intervention needed. - Return precautions provided if lesion becomes  larger/starts to bleed Nasal crusting Suspect possible MRSA infection of nares vs other bacterial infection. - Mupirocin  twice daily prescribed; patient instructed to use for a couple weeks     Alan Flies, MD [facility]

## 2024-01-11 NOTE — Patient Instructions (Addendum)
 Dear Robi Dani,   It was great seeing you in clinic today! You came in for a nodule of the face. I do not believe this is cancerous, and might be a mole versus a sebaceous cyst. Because this is on your face, we do not want to do a biopsy of this currently as it will scar.  For your left lower leg skin finding, this is likely a dermatofibroma, which is a kind of scar tissue and is not harmful; nothing needs to be done about this.  For your left upper leg/thigh skin finding, this is likely a mole (nevus). Please call if this becomes larger or bleeds.  There are a few things for you to do outside of clinic: - I have sent a referral in to dermatology to follow-up the nodule on your face - Keep a photo diary of the face nodule by taking a picture every ~2 weeks with some frame of reference for size to track growth  - I am sending in mupirocin /Bactroban  cream to use twice daily inside your nose to help with the pain/crusting  - To schedule your colonoscopy: call [facility]: [phone]  Thank you for allowing me to be a part of your care team! Alan Flies, MD [facility] [address] [place], [place], [place] [address] [phone]

## 2024-01-31 ENCOUNTER — Other Ambulatory Visit (HOSPITAL_COMMUNITY)
Admission: RE | Admit: 2024-01-31 | Discharge: 2024-01-31 | Disposition: A | Discharge status: Home / Self Care | Payer: MEDICAID | Source: Ambulatory Visit | Attending: Psychiatry | Admitting: Psychiatry

## 2024-01-31 DIAGNOSIS — F319 Bipolar disorder, unspecified: Secondary | ICD-10-CM | POA: Insufficient documentation

## 2024-01-31 LAB — COMPREHENSIVE METABOLIC PANEL WITH GFR
ALT: 22 U/L (ref 0–44)
AST: 17 U/L (ref 15–41)
Albumin: 3.7 g/dL (ref 3.5–5.0)
Alkaline Phosphatase: 82 U/L (ref 38–126)
Anion gap: 7 (ref 5–15)
BUN: 10 mg/dL (ref 6–20)
CO2: 26 mmol/L (ref 22–32)
Calcium: 9.3 mg/dL (ref 8.9–10.3)
Chloride: 106 mmol/L (ref 98–111)
Creatinine, Ser: 0.82 mg/dL (ref 0.44–1.00)
GFR, Estimated: >60 mL/min (ref >60)
Glucose, Bld: 89 mg/dL (ref 70–99)
Potassium: 3.7 mmol/L (ref 3.5–5.1)
Sodium: 139 mmol/L (ref 135–145)
Total Bilirubin: 0.7 mg/dL (ref 0.0–1.2)
Total Protein: 6.7 g/dL (ref 6.5–8.1)

## 2024-01-31 LAB — LIPID PANEL
Cholesterol: 236 mg/dL — ABNORMAL HIGH (ref 0–200)
HDL: 47 mg/dL (ref >40)
LDL Cholesterol: 149 mg/dL — ABNORMAL HIGH (ref 0–99)
Total CHOL/HDL Ratio: 5 ratio
Triglycerides: 200 mg/dL — ABNORMAL HIGH (ref <150)
VLDL: 40 mg/dL (ref 0–40)

## 2024-01-31 LAB — T4, FREE: Free T4: 0.85 ng/dL (ref 0.61–1.12)

## 2024-01-31 LAB — TSH: TSH: 2.942 u[IU]/mL (ref 0.350–4.500)

## 2024-02-01 LAB — HEMOGLOBIN A1C
Hgb A1c MFr Bld: 5.1 % (ref 4.8–5.6)
Mean Plasma Glucose: 100 mg/dL

## 2024-02-17 ENCOUNTER — Ambulatory Visit: Payer: Self-pay

## 2024-02-18 ENCOUNTER — Ambulatory Visit
Admission: RE | Admit: 2024-02-18 | Discharge: 2024-02-18 | Disposition: A | Discharge status: Home / Self Care | Payer: MEDICAID | Source: Ambulatory Visit | Attending: Emergency Medicine | Admitting: Emergency Medicine

## 2024-02-18 VITALS — BP 119/81 | HR 89 | Temp 98.6°F | Resp 18

## 2024-02-18 DIAGNOSIS — B349 Viral infection, unspecified: Secondary | ICD-10-CM | POA: Diagnosis not present

## 2024-02-18 DIAGNOSIS — U071 COVID-19: Secondary | ICD-10-CM

## 2024-02-18 LAB — POC SOFIA SARS ANTIGEN FIA: SARS Coronavirus 2 Ag: POSITIVE — AB

## 2024-02-18 NOTE — ED Triage Notes (Signed)
 Patient reports sore throat with red spots and nasal congestion that started 3 days ago. Patient has not taking anything for symptoms. Rates pain 10/10.

## 2024-02-18 NOTE — Discharge Instructions (Addendum)
 Covid 19 is a virus and should steadily improve in time it can take up to 7 to 10 days before you truly start to see a turnaround however things will get better    Per the CDC you will need to quarantine and to your 24 hours without fever, if no fever may continue activity wearing mask  You can take Tylenol and/or Ibuprofen  as needed for fever reduction and pain relief.   For cough: honey 1/2 to 1 teaspoon (you can dilute the honey in water or another fluid).  You can also use guaifenesin and dextromethorphan for cough. You can use a humidifier for chest congestion and cough.  If you don't have a humidifier, you can sit in the bathroom with the hot shower running.      For sore throat: try warm salt water gargles, cepacol lozenges, throat spray, warm tea or water with lemon/honey, popsicles or ice, or OTC cold relief medicine for throat discomfort.   For congestion: take a daily anti-histamine like Zyrtec, Claritin, and a oral decongestant, such as pseudoephedrine.  You can also use Flonase 1-2 sprays in each nostril daily.   It is important to stay hydrated: drink plenty of fluids (water, gatorade/powerade/pedialyte, juices, or teas) to keep your throat moisturized and help further relieve irritation/discomfort.
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
## 2024-02-23 ENCOUNTER — Encounter: Payer: Self-pay | Admitting: Family Medicine

## 2024-02-23 ENCOUNTER — Ambulatory Visit (INDEPENDENT_AMBULATORY_CARE_PROVIDER_SITE_OTHER): Payer: MEDICAID | Admitting: Family Medicine

## 2024-02-23 VITALS — BP 102/88 | HR 89 | Ht 64.0 in | Wt 161.6 lb

## 2024-02-23 DIAGNOSIS — E785 Hyperlipidemia, unspecified: Secondary | ICD-10-CM | POA: Diagnosis not present

## 2024-02-23 MED ORDER — ATORVASTATIN CALCIUM 80 MG PO TABS: 80.0000 mg | ORAL_TABLET | Freq: Every day | ORAL | 3 refills | Status: AC

## 2024-02-23 NOTE — Assessment & Plan Note (Signed)
 Given her significant family history, smoking, and abnormal lipid panel we will proceed with starting statin therapy today (atorvastatin  80mg ) I have referred her to cardiology for a possible calcium  score to further assess her cardiac risk Advised to let us  know if she has negative side effects from the statin

## 2024-02-23 NOTE — Patient Instructions (Signed)
 Good to see you today - Thank you for coming in  Things we discussed today:  I have referred you to cardiology We are starting a cholesterol medicine  If you have side effects, let us  know!

## 2024-02-23 NOTE — Progress Notes (Signed)
    SUBJECTIVE:   CHIEF COMPLAINT / HPI:   Lipids checked 01/31/24. She is here to discuss her abnormal results.  Total cholesterol 236, HDL 47, LDL 149, Triglycerides 200  Mother has had open heart surgery, HLD, HTN Two brothers with CHF, MI (passed away at 12, 44) Living brother with HLD, elevated lipoprotein A  She is a smoker.  Smoked cigarettes from the age of 75, about a year ago switched to vaping nicotine .  PERTINENT  PMH / PSH: Asthma, GERD, bipolar disorder, PTSD, generalized anxiety disorder, major depressive disorder  OBJECTIVE:   BP 102/88   Pulse 89   Ht 5' 4 (1.626 m)   Wt 161 lb 9.6 oz (73.3 kg)   SpO2 100%   BMI 27.74 kg/m   General: well appearing, NAD Cardiovascular: RRR, no m/r/g Respiratory: normal work of breathing on RA, CTAB  PREVENT score: 3.90 % 10-Year Total CVD Risk  10-Year ASCVD Risk: 2.40% 10-Year Heart Failure Risk: 1.43% 10-Year Coronary Heart Disease Risk: 1.28% 10-Year Stroke Risk: 1.18%  22.08 % 30-Year Total CVD Risk  30-Year ASCVD Risk: 13.00% 30-Year Heart Failure Risk: 10.56% 30-Year Coronary Heart Disease Risk: 7.54% 30-Year Stroke Risk: 6.38%  ASSESSMENT/PLAN:   Assessment & Plan Hyperlipidemia, unspecified hyperlipidemia type Given her significant family history, smoking, and abnormal lipid panel we will proceed with starting statin therapy today (atorvastatin  80mg ) I have referred her to cardiology for a possible calcium  score to further assess her cardiac risk Advised to let us  know if she has negative side effects from the statin    Waldemar Siegel, DO [facility]

## 2024-03-20 ENCOUNTER — Other Ambulatory Visit (HOSPITAL_COMMUNITY)
Admission: AD | Admit: 2024-03-20 | Discharge: 2024-03-20 | Disposition: A | Discharge status: Home / Self Care | Payer: MEDICAID | Source: Ambulatory Visit | Attending: Psychiatry | Admitting: Psychiatry

## 2024-03-20 DIAGNOSIS — F319 Bipolar disorder, unspecified: Secondary | ICD-10-CM | POA: Diagnosis present

## 2024-03-20 LAB — LITHIUM LEVEL: Lithium Lvl: 0.59 mmol/L — ABNORMAL LOW (ref 0.60–1.20)

## 2024-03-25 ENCOUNTER — Ambulatory Visit: Payer: MEDICAID | Admitting: Cardiology

## 2024-03-30 ENCOUNTER — Ambulatory Visit
Admission: RE | Admit: 2024-03-30 | Discharge: 2024-03-30 | Disposition: A | Discharge status: Home / Self Care | Payer: MEDICAID | Source: Ambulatory Visit

## 2024-03-30 VITALS — BP 109/72 | HR 99 | Temp 98.3°F | Resp 20

## 2024-03-30 DIAGNOSIS — B349 Viral infection, unspecified: Secondary | ICD-10-CM | POA: Diagnosis not present

## 2024-03-30 DIAGNOSIS — K130 Diseases of lips: Secondary | ICD-10-CM | POA: Diagnosis not present

## 2024-03-30 LAB — POC COVID19/FLU A&B COMBO
Covid Antigen, POC: NEGATIVE
Influenza A Antigen, POC: NEGATIVE
Influenza B Antigen, POC: NEGATIVE

## 2024-03-30 MED ORDER — PROMETHAZINE-DM 6.25-15 MG/5ML PO SYRP: 5.0000 mL | ORAL_SOLUTION | Freq: Every evening | ORAL | 0 refills | Status: AC | PRN

## 2024-03-30 MED ORDER — BENZONATATE 100 MG PO CAPS: 100.0000 mg | ORAL_CAPSULE | Freq: Three times a day (TID) | ORAL | 0 refills | Status: AC

## 2024-03-30 MED ORDER — IPRATROPIUM BROMIDE 0.03 % NA SOLN: 2.0000 | Freq: Two times a day (BID) | NASAL | 0 refills | Status: AC

## 2024-03-30 MED ORDER — TRIAMCINOLONE ACETONIDE 0.1 % EX CREA: 1.0000 | TOPICAL_CREAM | Freq: Two times a day (BID) | CUTANEOUS | 0 refills | Status: AC

## 2024-03-30 NOTE — Discharge Instructions (Addendum)
 Your symptoms today are most likely being caused by a virus and should steadily improve in time it can take up to 7 to 10 days before you truly start to see a turnaround however things will get better  COVID and flu test negative   May use Tessalon  pill every 8 hours as needed for cough, may discuss at bedtime to let you sleep  May use nasal spray twice daily  to help reduce the sinus  Bumps look like irritation may be due to to new lip liner, may apply triamcinolone  cream twice daily over the  You can take Tylenol  and/or Ibuprofen  as needed for fever reduction and pain relief.   For cough: honey 1/2 to 1 teaspoon (you can dilute the honey in water or another fluid).  You can also use guaifenesin  and dextromethorphan  for cough. You can use a humidifier for chest congestion and cough.  If you don't have a humidifier, you can sit in the bathroom with the hot shower running.      For sore throat: try warm salt water gargles, cepacol lozenges, throat spray, warm tea or water with lemon/honey, popsicles or ice, or OTC cold relief medicine for throat discomfort.   For congestion: take a daily anti-histamine like Zyrtec , Claritin , and a oral decongestant, such as pseudoephedrine .  You can also use Flonase  1-2 sprays in each nostril daily.   It is important to stay hydrated: drink plenty of fluids (water, gatorade/powerade/pedialyte, juices, or teas) to keep your throat moisturized and help further relieve irritation/discomfort.
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
## 2024-03-30 NOTE — ED Triage Notes (Signed)
 Patient nasal congestion with green sputum, productive cough, sore throat, and body aches x 2 days. Patient has taken Sudafed with mild relief. Rates sore throat 7/10 and body aches 8/10.

## 2024-04-03 ENCOUNTER — Ambulatory Visit: Payer: MEDICAID | Admitting: Cardiology

## 2024-04-08 ENCOUNTER — Encounter: Payer: Self-pay | Admitting: Radiology

## 2024-04-18 ENCOUNTER — Encounter: Payer: Self-pay | Admitting: Cardiology

## 2024-04-18 ENCOUNTER — Ambulatory Visit: Payer: MEDICAID | Attending: Cardiology | Admitting: Cardiology

## 2024-04-18 VITALS — BP 100/71 | HR 84 | Ht 64.0 in | Wt 160.8 lb

## 2024-04-18 DIAGNOSIS — R072 Precordial pain: Secondary | ICD-10-CM

## 2024-04-18 DIAGNOSIS — Z8249 Family history of ischemic heart disease and other diseases of the circulatory system: Secondary | ICD-10-CM | POA: Diagnosis not present

## 2024-04-18 DIAGNOSIS — E782 Mixed hyperlipidemia: Secondary | ICD-10-CM | POA: Diagnosis not present

## 2024-04-18 MED ORDER — IVABRADINE HCL 5 MG PO TABS: 15.0000 mg | ORAL_TABLET | Freq: Once | ORAL | 0 refills | Status: AC

## 2024-04-18 NOTE — Patient Instructions (Addendum)
 Medication Instructions:  - take 15 of ivabridine two hours prior to cardiac CT  *If you need a refill on your cardiac medications before your next appointment, please call your pharmacy*  Lab Work: Your provider would like for you to have following labs drawn today BMP.   If you have labs (blood work) drawn today and your tests are completely normal, you will receive your results only by: MyChart Message (if you have MyChart) OR A paper copy in the mail If you have any lab test that is abnormal or we need to change your treatment, we will call you to review the results.  Your provider would like for you to return in on day of echo to have the following labs drawn: lipid panel, lipoprotein A.   Please go to [facility] [address] ([facility]) [address], [place] [address] You do not need an appointment.  They are open from 8 am- 4:30 pm.  Lunch from 1:00 pm- 2:00 pm You DO need to be fasting.  Testing/Procedures: Your physician has requested that you have an echocardiogram. Echocardiography is a painless test that uses sound waves to create images of your heart. It provides your doctor with information about the size and shape of your heart and how well your heart's chambers and valves are working.   You may receive an ultrasound enhancing agent through an IV if needed to better visualize your heart during the echo. This procedure takes approximately one hour.  There are no restrictions for this procedure.  This will take place at [address] [facility]) [address], [place] [address]  Please note: We ask at that you not bring children with you during ultrasound (echo/ vascular) testing. Due to room size and safety concerns, children are not allowed in the ultrasound rooms during exams. Our front office staff cannot provide observation of children in our lobby area while testing is being conducted. An adult accompanying a patient to their appointment  will only be allowed in the ultrasound room at the discretion of the ultrasound technician under special circumstances. We apologize for any inconvenience.   Your cardiac CT will be scheduled at:  [facility] [address] [place], [place] [address] [phone]  Please arrive 15 mins early for check-in and test prep.  There is spacious parking and easy access to the radiology department from the [facility] entrance. Please enter here and check-in with the desk attendant.    Please follow these instructions carefully (unless otherwise directed):  An IV will be required for this test and Nitroglycerin  will be given.  Hold all erectile dysfunction medications at least 3 days (72 hrs) prior to test. (Ie viagra, cialis, sildenafil, tadalafil, etc)     On the Night Before the Test: Be sure to Drink plenty of water. Do not consume any caffeinated/decaffeinated beverages or chocolate 12 hours prior to your test. Do not take any antihistamines 12 hours prior to your test.  On the Day of the Test: Drink plenty of water until 1 hour prior to the test. Do not eat any food 1 hour prior to test. You may take your regular medications prior to the test.  If you take Furosemide/Hydrochlorothiazide/Spironolactone/Chlorthalidone, please HOLD on the morning of the test. Patients who wear a continuous glucose monitor MUST remove the device prior to scanning. FEMALES- please wear underwire-free bra if available, avoid dresses & tight clothing       After the Test:  Drink plenty of water. After receiving IV contrast, you may experience a mild flushed feeling. This is normal. On occasion, you may experience a mild rash up to 24 hours after the test. This is not dangerous. If this occurs, you can take Benadryl  25 mg, Zyrtec , Claritin , or Allegra  and increase your fluid intake. (Patients taking Tikosyn should avoid Benadryl , and may take Zyrtec , Claritin , or  Allegra ) If you experience trouble breathing, this can be serious. If it is severe call 911 IMMEDIATELY. If it is mild, please call our office.  We will call to schedule your test 2-4 weeks out understanding that some insurance companies will need an authorization prior to the service being performed.   For more information and frequently asked questions, please visit our website : [email or web address]  For non-scheduling related questions, please contact the cardiac imaging nurse navigator should you have any questions/concerns: Cardiac Imaging Nurse Navigators Direct Office Dial: [phone]   For scheduling needs, including cancellations and rescheduling, please call Brittany, [phone].    Follow-Up: At [facility], you and your health needs are our priority.  As part of our continuing mission to provide you with exceptional heart care, our providers are all part of one team.  This team includes your primary Cardiologist (physician) and Advanced Practice Providers or APPs (Physician Assistants and Nurse Practitioners) who all work together to provide you with the care you need, when you need it.  Your next appointment:   3 month(s)  Provider:   You may see Dr Darliss or one of the following Advanced Practice Providers on your designated Care Team:   Lonni Meager, NP Lesley Maffucci, PA-C Bernardino Bring, PA-C Cadence Pomona, PA-C Tylene Lunch, NP Barnie Hila, NP    We recommend signing up for the patient portal called MyChart.  Sign up information is provided on this After Visit Summary.  MyChart is used to connect with patients for Virtual Visits (Telemedicine).  Patients are able to view lab/test results, encounter notes, upcoming appointments, etc.  Non-urgent messages can be sent to your provider as well.   To learn more about what you can do with MyChart, go to [email or web address].
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
## 2024-04-19 LAB — BASIC METABOLIC PANEL WITH GFR
BUN/Creatinine Ratio: 12 (ref 9–23)
BUN: 8 mg/dL (ref 6–24)
CO2: 23 mmol/L (ref 20–29)
Calcium: 9.7 mg/dL (ref 8.7–10.2)
Chloride: 104 mmol/L (ref 96–106)
Creatinine, Ser: 0.67 mg/dL (ref 0.57–1.00)
Glucose: 77 mg/dL (ref 70–99)
Potassium: 4.4 mmol/L (ref 3.5–5.2)
Sodium: 141 mmol/L (ref 134–144)
eGFR: 106 mL/min/1.73 (ref >59)

## 2024-05-13 ENCOUNTER — Ambulatory Visit: Payer: MEDICAID | Attending: Cardiology

## 2024-05-13 ENCOUNTER — Other Ambulatory Visit: Payer: Self-pay | Admitting: Emergency Medicine

## 2024-05-13 DIAGNOSIS — R072 Precordial pain: Secondary | ICD-10-CM | POA: Diagnosis not present

## 2024-05-13 DIAGNOSIS — Z8249 Family history of ischemic heart disease and other diseases of the circulatory system: Secondary | ICD-10-CM

## 2024-05-13 DIAGNOSIS — E782 Mixed hyperlipidemia: Secondary | ICD-10-CM

## 2024-05-13 LAB — ECHOCARDIOGRAM COMPLETE
AR max vel: 1.9 cm2
AV Area VTI: 1.92 cm2
AV Area mean vel: 1.91 cm2
AV Mean grad: 4 mmHg
AV Peak grad: 7.8 mmHg
Ao pk vel: 1.4 m/s
Area-P 1/2: 4.68 cm2
S' Lateral: 2.3 cm

## 2024-05-16 ENCOUNTER — Ambulatory Visit: Payer: Self-pay | Admitting: Cardiology

## 2024-05-16 LAB — LIPID PANEL
Chol/HDL Ratio: 2.9 ratio (ref 0.0–4.4)
Cholesterol, Total: 134 mg/dL (ref 100–199)
HDL: 47 mg/dL (ref >39)
LDL Chol Calc (NIH): 71 mg/dL (ref 0–99)
Triglycerides: 85 mg/dL (ref 0–149)
VLDL Cholesterol Cal: 16 mg/dL (ref 5–40)

## 2024-05-16 LAB — LIPOPROTEIN A (LPA): Lipoprotein (a): 91.4 nmol/L — ABNORMAL HIGH (ref <75.0)

## 2024-06-18 ENCOUNTER — Ambulatory Visit
Admission: RE | Admit: 2024-06-18 | Discharge: 2024-06-18 | Disposition: A | Discharge status: Home / Self Care | Payer: MEDICAID | Source: Ambulatory Visit | Attending: Emergency Medicine | Admitting: Emergency Medicine

## 2024-06-18 VITALS — BP 118/77 | HR 96 | Temp 98.2°F | Resp 20

## 2024-06-18 DIAGNOSIS — B349 Viral infection, unspecified: Secondary | ICD-10-CM

## 2024-06-18 LAB — POC COVID19/FLU A&B COMBO
Covid Antigen, POC: NEGATIVE
Influenza A Antigen, POC: NEGATIVE
Influenza B Antigen, POC: NEGATIVE

## 2024-06-18 MED ORDER — GUAIFENESIN-CODEINE 100-10 MG/5ML PO SOLN: 5.0000 mL | Freq: Every evening | ORAL | 0 refills | Status: AC | PRN

## 2024-06-18 NOTE — ED Triage Notes (Signed)
 Patient reports nasal congestion, and cough x 3 days. Patient stated she had a sore throat on Sunday but it has resolved. Patient has not taking anything for symptoms.

## 2024-06-18 NOTE — Discharge Instructions (Signed)
 Your symptoms today are most likely being caused by a virus and should steadily improve in time it can take up to 7 to 10 days before you truly start to see a turnaround however things will get better  Covid and  flu testing negative  May use cough syrup at bedtime to allow for rest    You can take Tylenol  and/or Ibuprofen  as needed for fever reduction and pain relief.   For cough: honey 1/2 to 1 teaspoon (you can dilute the honey in water or another fluid).  You can also use guaifenesin  and dextromethorphan  for cough. You can use a humidifier for chest congestion and cough.  If you don't have a humidifier, you can sit in the bathroom with the hot shower running.      For sore throat: try warm salt water gargles, cepacol lozenges, throat spray, warm tea or water with lemon/honey, popsicles or ice, or OTC cold relief medicine for throat discomfort.   For congestion: take a daily anti-histamine like Zyrtec , Claritin , and a oral decongestant, such as pseudoephedrine .  You can also use Flonase  1-2 sprays in each nostril daily.   It is important to stay hydrated: drink plenty of fluids (water, gatorade/powerade/pedialyte, juices, or teas) to keep your throat moisturized and help further relieve irritation/discomfort.
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
## 2024-06-26 ENCOUNTER — Other Ambulatory Visit: Payer: Self-pay

## 2024-06-26 ENCOUNTER — Telehealth: Payer: Self-pay | Admitting: Cardiology

## 2024-06-26 MED ORDER — IVABRADINE HCL 5 MG PO TABS
15.0000 mg | ORAL_TABLET | Freq: Once | ORAL | 0 refills | Status: AC
  Filled 2024-06-26: qty 3, 1d supply, fill #0

## 2024-06-26 NOTE — Telephone Encounter (Signed)
 Patient is calling to see if there is any interaction between ivabradine  and lithium   Patient is to take ivabradine  15 mg prior to Cardiac CTA on Monday

## 2024-06-26 NOTE — Telephone Encounter (Signed)
 Patient wants to speak with someone about her current medications and also has some questions about her medication for her upcoming CT. Please call the patient to advise. Thank you

## 2024-06-26 NOTE — Telephone Encounter (Signed)
 Called patient to advise of no interactions with medications - patient verbalized understanding

## 2024-06-28 ENCOUNTER — Telehealth (HOSPITAL_COMMUNITY): Payer: Self-pay | Admitting: *Deleted

## 2024-06-28 NOTE — Telephone Encounter (Signed)
 Reaching out to patient to offer assistance regarding upcoming cardiac imaging study; pt verbalizes understanding of appt date/time, parking situation and where to check in, pre-test NPO status and medications ordered, and verified current allergies; name and call back number provided for further questions should they arise Sid Seats RN Navigator Cardiac Imaging [facility] [phone] office [phone] cell

## 2024-07-01 ENCOUNTER — Ambulatory Visit: Admission: RE | Admit: 2024-07-01 | Payer: MEDICAID | Source: Ambulatory Visit

## 2024-07-11 ENCOUNTER — Telehealth (HOSPITAL_COMMUNITY): Payer: Self-pay | Admitting: *Deleted

## 2024-07-11 NOTE — Telephone Encounter (Signed)
 Patient calling about her upcoming cardiac imaging study; pt verbalizes understanding of appt date/time, parking situation and where to check in, pre-test NPO status and medications ordered, and verified current allergies; name and call back number provided for further questions should they arise  Kelly Requena RN Navigator Cardiac Imaging [facility] [phone] office [phone] cell  Patient aware to take 15mg  ivabradine  two hours prior to her cardiac CT scan.

## 2024-07-15 ENCOUNTER — Ambulatory Visit: Payer: MEDICAID

## 2024-07-19 ENCOUNTER — Ambulatory Visit: Payer: MEDICAID | Admitting: Cardiology

## 2024-08-26 ENCOUNTER — Ambulatory Visit: Payer: MEDICAID | Admitting: Physician Assistant
